# Patient Record
Sex: Male | Born: 1960
Health system: Southern US, Community
[De-identification: ages and names within clinical notes are randomized; demographics above are authoritative.]

## PROBLEM LIST (undated history)

## (undated) ENCOUNTER — Emergency Department (HOSPITAL_COMMUNITY): Payer: Medicaid Other | Source: Home / Self Care

## (undated) DIAGNOSIS — H6091 Unspecified otitis externa, right ear: Secondary | ICD-10-CM

## (undated) DIAGNOSIS — B1920 Unspecified viral hepatitis C without hepatic coma: Secondary | ICD-10-CM

## (undated) DIAGNOSIS — I10 Essential (primary) hypertension: Secondary | ICD-10-CM

## (undated) DIAGNOSIS — D649 Anemia, unspecified: Secondary | ICD-10-CM

## (undated) DIAGNOSIS — G4733 Obstructive sleep apnea (adult) (pediatric): Secondary | ICD-10-CM

## (undated) DIAGNOSIS — J449 Chronic obstructive pulmonary disease, unspecified: Secondary | ICD-10-CM

## (undated) DIAGNOSIS — K5790 Diverticulosis of intestine, part unspecified, without perforation or abscess without bleeding: Secondary | ICD-10-CM

## (undated) DIAGNOSIS — E785 Hyperlipidemia, unspecified: Secondary | ICD-10-CM

## (undated) DIAGNOSIS — D126 Benign neoplasm of colon, unspecified: Secondary | ICD-10-CM

## (undated) DIAGNOSIS — J45909 Unspecified asthma, uncomplicated: Secondary | ICD-10-CM

## (undated) DIAGNOSIS — E079 Disorder of thyroid, unspecified: Secondary | ICD-10-CM

## (undated) DIAGNOSIS — N189 Chronic kidney disease, unspecified: Secondary | ICD-10-CM

## (undated) DIAGNOSIS — G4730 Sleep apnea, unspecified: Secondary | ICD-10-CM

## (undated) DIAGNOSIS — I509 Heart failure, unspecified: Secondary | ICD-10-CM

## (undated) DIAGNOSIS — M109 Gout, unspecified: Secondary | ICD-10-CM

## (undated) HISTORY — DX: Hyperlipidemia, unspecified: E78.5

## (undated) HISTORY — DX: Sleep apnea, unspecified: G47.30

## (undated) HISTORY — DX: Obstructive sleep apnea (adult) (pediatric): G47.33

## (undated) HISTORY — DX: Heart failure, unspecified: I50.9

## (undated) HISTORY — DX: Anemia, unspecified: D64.9

## (undated) HISTORY — DX: Benign neoplasm of colon, unspecified: D12.6

## (undated) HISTORY — DX: Gout, unspecified: M10.9

## (undated) HISTORY — DX: Unspecified asthma, uncomplicated: J45.909

## (undated) HISTORY — DX: Disorder of thyroid, unspecified: E07.9

## (undated) HISTORY — DX: Unspecified viral hepatitis C without hepatic coma: B19.20

## (undated) HISTORY — DX: Chronic kidney disease, unspecified: N18.9

## (undated) HISTORY — DX: Diverticulosis of intestine, part unspecified, without perforation or abscess without bleeding: K57.90

## (undated) HISTORY — DX: Essential (primary) hypertension: I10

---
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
## 2004-12-31 ENCOUNTER — Emergency Department (HOSPITAL_COMMUNITY): Admission: EM | Admit: 2004-12-31 | Discharge: 2004-12-31 | Payer: Self-pay | Admitting: Emergency Medicine

## 2005-01-31 ENCOUNTER — Emergency Department (HOSPITAL_COMMUNITY): Admission: EM | Admit: 2005-01-31 | Discharge: 2005-01-31 | Payer: Self-pay | Admitting: Emergency Medicine

## 2005-05-09 ENCOUNTER — Emergency Department (HOSPITAL_COMMUNITY): Admission: EM | Admit: 2005-05-09 | Discharge: 2005-05-10 | Payer: Self-pay | Admitting: Emergency Medicine

## 2005-07-11 ENCOUNTER — Emergency Department (HOSPITAL_COMMUNITY): Admission: EM | Admit: 2005-07-11 | Discharge: 2005-07-11 | Payer: Self-pay | Admitting: Family Medicine

## 2005-07-14 ENCOUNTER — Emergency Department (HOSPITAL_COMMUNITY): Admission: EM | Admit: 2005-07-14 | Discharge: 2005-07-14 | Payer: Self-pay | Admitting: Emergency Medicine

## 2006-09-13 ENCOUNTER — Emergency Department (HOSPITAL_COMMUNITY): Admission: EM | Admit: 2006-09-13 | Discharge: 2006-09-13 | Payer: Self-pay | Admitting: Family Medicine

## 2007-03-12 ENCOUNTER — Emergency Department (HOSPITAL_COMMUNITY): Admission: EM | Admit: 2007-03-12 | Discharge: 2007-03-12 | Payer: Self-pay | Admitting: Family Medicine

## 2007-04-28 ENCOUNTER — Emergency Department (HOSPITAL_COMMUNITY): Admission: EM | Admit: 2007-04-28 | Discharge: 2007-04-28 | Payer: Self-pay | Admitting: Emergency Medicine

## 2007-11-25 ENCOUNTER — Emergency Department (HOSPITAL_COMMUNITY): Admission: EM | Admit: 2007-11-25 | Discharge: 2007-11-25 | Payer: Self-pay | Admitting: Family Medicine

## 2009-02-21 ENCOUNTER — Inpatient Hospital Stay (HOSPITAL_COMMUNITY): Admission: EM | Admit: 2009-02-21 | Discharge: 2009-02-26 | Payer: Self-pay | Admitting: Family Medicine

## 2009-02-22 ENCOUNTER — Encounter (INDEPENDENT_AMBULATORY_CARE_PROVIDER_SITE_OTHER): Payer: Self-pay | Admitting: Emergency Medicine

## 2009-03-03 ENCOUNTER — Ambulatory Visit: Payer: Self-pay | Admitting: *Deleted

## 2009-04-01 ENCOUNTER — Ambulatory Visit: Payer: Self-pay | Admitting: Family Medicine

## 2009-04-21 ENCOUNTER — Ambulatory Visit: Payer: Self-pay | Admitting: *Deleted

## 2009-06-14 ENCOUNTER — Encounter (INDEPENDENT_AMBULATORY_CARE_PROVIDER_SITE_OTHER): Payer: Self-pay | Admitting: Family Medicine

## 2009-06-14 ENCOUNTER — Other Ambulatory Visit: Admission: RE | Admit: 2009-06-14 | Discharge: 2009-06-14 | Payer: Self-pay | Admitting: Family Medicine

## 2009-06-14 ENCOUNTER — Ambulatory Visit: Payer: Self-pay | Admitting: Family Medicine

## 2009-07-03 ENCOUNTER — Emergency Department (HOSPITAL_COMMUNITY): Admission: EM | Admit: 2009-07-03 | Discharge: 2009-07-03 | Payer: Self-pay | Admitting: Emergency Medicine

## 2009-07-10 ENCOUNTER — Emergency Department (HOSPITAL_COMMUNITY): Admission: EM | Admit: 2009-07-10 | Discharge: 2009-07-10 | Payer: Self-pay | Admitting: Emergency Medicine

## 2009-08-23 ENCOUNTER — Ambulatory Visit (HOSPITAL_COMMUNITY): Admission: RE | Admit: 2009-08-23 | Discharge: 2009-08-23 | Payer: Self-pay | Admitting: Vascular Surgery

## 2009-08-23 ENCOUNTER — Ambulatory Visit: Payer: Self-pay | Admitting: Vascular Surgery

## 2009-09-07 ENCOUNTER — Ambulatory Visit: Payer: Self-pay | Admitting: Vascular Surgery

## 2009-09-12 ENCOUNTER — Ambulatory Visit (HOSPITAL_COMMUNITY): Admission: RE | Admit: 2009-09-12 | Discharge: 2009-09-12 | Payer: Self-pay | Admitting: Vascular Surgery

## 2009-09-12 HISTORY — PX: AV FISTULA PLACEMENT: SHX1204

## 2009-09-23 ENCOUNTER — Ambulatory Visit: Payer: Self-pay | Admitting: Family Medicine

## 2009-10-06 ENCOUNTER — Ambulatory Visit: Payer: Self-pay | Admitting: *Deleted

## 2009-10-14 ENCOUNTER — Ambulatory Visit: Payer: Self-pay | Admitting: Vascular Surgery

## 2009-12-02 ENCOUNTER — Ambulatory Visit: Payer: Self-pay | Admitting: Vascular Surgery

## 2010-03-12 ENCOUNTER — Emergency Department (HOSPITAL_COMMUNITY): Admission: EM | Admit: 2010-03-12 | Discharge: 2010-03-12 | Payer: Self-pay | Admitting: Family Medicine

## 2011-01-09 ENCOUNTER — Inpatient Hospital Stay (INDEPENDENT_AMBULATORY_CARE_PROVIDER_SITE_OTHER)
Admission: RE | Admit: 2011-01-09 | Discharge: 2011-01-09 | Disposition: A | Discharge status: Home / Self Care | Payer: Self-pay | Source: Ambulatory Visit | Attending: Emergency Medicine | Admitting: Emergency Medicine

## 2011-01-09 DIAGNOSIS — J039 Acute tonsillitis, unspecified: Secondary | ICD-10-CM

## 2011-01-09 DIAGNOSIS — I889 Nonspecific lymphadenitis, unspecified: Secondary | ICD-10-CM

## 2011-01-31 ENCOUNTER — Emergency Department (HOSPITAL_COMMUNITY)
Admission: EM | Admit: 2011-01-31 | Discharge: 2011-01-31 | Disposition: A | Discharge status: Home / Self Care | Payer: Self-pay | Attending: Emergency Medicine | Admitting: Emergency Medicine

## 2011-01-31 DIAGNOSIS — M79609 Pain in unspecified limb: Secondary | ICD-10-CM | POA: Insufficient documentation

## 2011-01-31 DIAGNOSIS — I10 Essential (primary) hypertension: Secondary | ICD-10-CM | POA: Insufficient documentation

## 2011-02-07 LAB — POCT I-STAT 4, (NA,K, GLUC, HGB,HCT)
Glucose, Bld: 97 mg/dL (ref 70–99)
HCT: 44 % (ref 39.0–52.0)
Hemoglobin: 15 g/dL (ref 13.0–17.0)

## 2011-02-08 LAB — POCT I-STAT 4, (NA,K, GLUC, HGB,HCT)
Glucose, Bld: 99 mg/dL (ref 70–99)
Potassium: 3.6 mEq/L (ref 3.5–5.1)
Sodium: 143 mEq/L (ref 135–145)

## 2011-02-09 LAB — POCT I-STAT, CHEM 8
BUN: 38 mg/dL — ABNORMAL HIGH (ref 6–23)
Calcium, Ion: 1.17 mmol/L (ref 1.12–1.32)
Creatinine, Ser: 3.4 mg/dL — ABNORMAL HIGH (ref 0.4–1.5)
Glucose, Bld: 72 mg/dL (ref 70–99)
Hemoglobin: 12.9 g/dL — ABNORMAL LOW (ref 13.0–17.0)
Sodium: 144 mEq/L (ref 135–145)
TCO2: 30 mmol/L (ref 0–100)

## 2011-02-14 LAB — C3 COMPLEMENT: C3 Complement: 76 mg/dL — ABNORMAL LOW (ref 88–201)

## 2011-02-14 LAB — CBC
HCT: 31.4 % — ABNORMAL LOW (ref 39.0–52.0)
HCT: 32.1 % — ABNORMAL LOW (ref 39.0–52.0)
HCT: 34.5 % — ABNORMAL LOW (ref 39.0–52.0)
Hemoglobin: 11 g/dL — ABNORMAL LOW (ref 13.0–17.0)
Hemoglobin: 12 g/dL — ABNORMAL LOW (ref 13.0–17.0)
Hemoglobin: 12.2 g/dL — ABNORMAL LOW (ref 13.0–17.0)
Hemoglobin: 13 g/dL (ref 13.0–17.0)
MCHC: 33.4 g/dL (ref 30.0–36.0)
MCHC: 34.7 g/dL (ref 30.0–36.0)
MCHC: 34.8 g/dL (ref 30.0–36.0)
MCHC: 35 g/dL (ref 30.0–36.0)
MCV: 83.7 fL (ref 78.0–100.0)
MCV: 83.8 fL (ref 78.0–100.0)
Platelets: 101 10*3/uL — ABNORMAL LOW (ref 150–400)
Platelets: 109 10*3/uL — ABNORMAL LOW (ref 150–400)
Platelets: 117 10*3/uL — ABNORMAL LOW (ref 150–400)
Platelets: 133 10*3/uL — ABNORMAL LOW (ref 150–400)
Platelets: 161 10*3/uL (ref 150–400)
RBC: 4.13 MIL/uL — ABNORMAL LOW (ref 4.22–5.81)
RDW: 13.5 % (ref 11.5–15.5)
RDW: 13.7 % (ref 11.5–15.5)
RDW: 13.8 % (ref 11.5–15.5)
RDW: 13.9 % (ref 11.5–15.5)
RDW: 13.9 % (ref 11.5–15.5)
RDW: 14.3 % (ref 11.5–15.5)

## 2011-02-14 LAB — BASIC METABOLIC PANEL WITH GFR
CO2: 31 meq/L (ref 19–32)
Chloride: 97 meq/L (ref 96–112)
Glucose, Bld: 95 mg/dL (ref 70–99)
Potassium: 3 meq/L — ABNORMAL LOW (ref 3.5–5.1)
Sodium: 138 meq/L (ref 135–145)

## 2011-02-14 LAB — COMPREHENSIVE METABOLIC PANEL
ALT: 46 U/L (ref 0–53)
ALT: 58 U/L — ABNORMAL HIGH (ref 0–53)
AST: 40 U/L — ABNORMAL HIGH (ref 0–37)
AST: 57 U/L — ABNORMAL HIGH (ref 0–37)
Albumin: 2.8 g/dL — ABNORMAL LOW (ref 3.5–5.2)
Alkaline Phosphatase: 52 U/L (ref 39–117)
CO2: 33 mEq/L — ABNORMAL HIGH (ref 19–32)
Calcium: 8.5 mg/dL (ref 8.4–10.5)
Chloride: 98 mEq/L (ref 96–112)
GFR calc Af Amer: 17 mL/min — ABNORMAL LOW (ref >60)
GFR calc non Af Amer: 16 mL/min — ABNORMAL LOW (ref >60)
Potassium: 4.2 mEq/L (ref 3.5–5.1)
Sodium: 135 mEq/L (ref 135–145)
Sodium: 138 mEq/L (ref 135–145)
Total Protein: 6.2 g/dL (ref 6.0–8.3)

## 2011-02-14 LAB — HEMOGLOBIN A1C

## 2011-02-14 LAB — HEPATIC FUNCTION PANEL
Bilirubin, Direct: 0.1 mg/dL (ref 0.0–0.3)
Indirect Bilirubin: 0.8 mg/dL (ref 0.3–0.9)

## 2011-02-14 LAB — PROTEIN, URINE, 24 HOUR
Protein, 24H Urine: 1184 mg/d — ABNORMAL HIGH (ref 50–100)
Urine Total Volume-UPROT: 1850 mL

## 2011-02-14 LAB — CK TOTAL AND CKMB (NOT AT ARMC)
CK, MB: 4.6 ng/mL — ABNORMAL HIGH (ref 0.3–4.0)
CK, MB: 7.2 ng/mL — ABNORMAL HIGH (ref 0.3–4.0)
Relative Index: 0.5 (ref 0.0–2.5)
Relative Index: 0.5 (ref 0.0–2.5)
Relative Index: 0.5 (ref 0.0–2.5)
Relative Index: 0.6 (ref 0.0–2.5)
Total CK: 1194 U/L — ABNORMAL HIGH (ref 7–232)
Total CK: 866 U/L — ABNORMAL HIGH (ref 7–232)

## 2011-02-14 LAB — RETICULOCYTES
RBC.: 4.37 MIL/uL (ref 4.22–5.81)
Retic Count, Absolute: 39.3 10*3/uL (ref 19.0–186.0)

## 2011-02-14 LAB — TSH: TSH: 1.833 u[IU]/mL (ref 0.350–4.500)

## 2011-02-14 LAB — CALCIUM, IONIZED: Calcium, Ion: 1.12 mmol/L (ref 1.12–1.32)

## 2011-02-14 LAB — POCT I-STAT, CHEM 8
Calcium, Ion: 1.09 mmol/L — ABNORMAL LOW (ref 1.12–1.32)
Creatinine, Ser: 4.7 mg/dL — ABNORMAL HIGH (ref 0.4–1.5)
Glucose, Bld: 124 mg/dL — ABNORMAL HIGH (ref 70–99)
Hemoglobin: 15.6 g/dL (ref 13.0–17.0)
Potassium: 3.2 mEq/L — ABNORMAL LOW (ref 3.5–5.1)

## 2011-02-14 LAB — RAPID URINE DRUG SCREEN, HOSP PERFORMED
Amphetamines: NOT DETECTED
Benzodiazepines: NOT DETECTED
Cocaine: NOT DETECTED
Opiates: NOT DETECTED
Tetrahydrocannabinol: POSITIVE — AB

## 2011-02-14 LAB — BASIC METABOLIC PANEL
BUN: 26 mg/dL — ABNORMAL HIGH (ref 6–23)
BUN: 40 mg/dL — ABNORMAL HIGH (ref 6–23)
CO2: 28 mEq/L (ref 19–32)
Calcium: 8.1 mg/dL — ABNORMAL LOW (ref 8.4–10.5)
Calcium: 8.4 mg/dL (ref 8.4–10.5)
Calcium: 8.7 mg/dL (ref 8.4–10.5)
Calcium: 8.7 mg/dL (ref 8.4–10.5)
Chloride: 106 mEq/L (ref 96–112)
Creatinine, Ser: 3.89 mg/dL — ABNORMAL HIGH (ref 0.4–1.5)
Creatinine, Ser: 4.19 mg/dL — ABNORMAL HIGH (ref 0.4–1.5)
Creatinine, Ser: 4.23 mg/dL — ABNORMAL HIGH (ref 0.4–1.5)
GFR calc Af Amer: 19 mL/min — ABNORMAL LOW (ref >60)
GFR calc Af Amer: 20 mL/min — ABNORMAL LOW (ref >60)
GFR calc non Af Amer: 15 mL/min — ABNORMAL LOW (ref >60)
GFR calc non Af Amer: 15 mL/min — ABNORMAL LOW (ref >60)
GFR calc non Af Amer: 16 mL/min — ABNORMAL LOW (ref >60)
Glucose, Bld: 132 mg/dL — ABNORMAL HIGH (ref 70–99)
Potassium: 3.9 mEq/L (ref 3.5–5.1)
Sodium: 132 mEq/L — ABNORMAL LOW (ref 135–145)
Sodium: 140 mEq/L (ref 135–145)

## 2011-02-14 LAB — UIFE/LIGHT CHAINS/TP QN, 24-HR UR
Free Kappa Lt Chains,Ur: 14.5 mg/dL — ABNORMAL HIGH (ref 0.04–1.51)
Free Kappa/Lambda Ratio: 3.82 ratio (ref 0.46–4.00)
Free Lambda Excretion/Day: 95 mg/d
Free Lambda Lt Chains,Ur: 3.8 mg/dL — ABNORMAL HIGH (ref 0.08–1.01)
Free Lt Chn Excr Rate: 362.5 mg/d
Time: 24 hours
Total Protein, Urine-Ur/day: 1030 mg/d — ABNORMAL HIGH (ref 10–140)
Total Protein, Urine: 41.2 mg/dL
Volume, Urine: 2500 mL

## 2011-02-14 LAB — URINALYSIS, ROUTINE W REFLEX MICROSCOPIC
Bilirubin Urine: NEGATIVE
Ketones, ur: NEGATIVE mg/dL
Nitrite: NEGATIVE
Urobilinogen, UA: 1 mg/dL (ref 0.0–1.0)

## 2011-02-14 LAB — HEPATITIS PANEL, ACUTE
Hep B C IgM: NEGATIVE
Hepatitis B Surface Ag: NEGATIVE

## 2011-02-14 LAB — LIPID PANEL
Total CHOL/HDL Ratio: 3.7 RATIO
VLDL: 16 mg/dL (ref 0–40)

## 2011-02-14 LAB — DIFFERENTIAL
Basophils Absolute: 0.1 10*3/uL (ref 0.0–0.1)
Basophils Relative: 1 % (ref 0–1)
Eosinophils Relative: 2 % (ref 0–5)
Monocytes Absolute: 0.9 10*3/uL (ref 0.1–1.0)
Neutro Abs: 4.9 10*3/uL (ref 1.7–7.7)

## 2011-02-14 LAB — PROTIME-INR: INR: 1 (ref 0.00–1.49)

## 2011-02-14 LAB — HEPATITIS C ANTIBODY: HCV Ab: REACTIVE — AB

## 2011-02-14 LAB — HEPARIN INDUCED THROMBOCYTOPENIA PNL: Patient O.D.: 0.404

## 2011-02-14 LAB — METANEPHRINES, URINE, 24 HOUR: Normetanephrine, 24H Ur: 435 mcg/24 h (ref 88–649)

## 2011-02-14 LAB — IRON AND TIBC
Iron: 63 ug/dL (ref 42–135)
TIBC: 290 ug/dL (ref 215–435)

## 2011-02-14 LAB — CARDIAC PANEL(CRET KIN+CKTOT+MB+TROPI): Troponin I: 0.14 ng/mL — ABNORMAL HIGH (ref 0.00–0.06)

## 2011-02-14 LAB — C4 COMPLEMENT: Complement C4, Body Fluid: 25 mg/dL (ref 16–47)

## 2011-02-14 LAB — TROPONIN I

## 2011-02-14 LAB — CREATININE, URINE, RANDOM: Creatinine, Urine: 225.5 mg/dL

## 2011-02-14 LAB — APTT: aPTT: 31 seconds (ref 24–37)

## 2011-02-14 LAB — HEMOCCULT GUIAC POC 1CARD (OFFICE): Fecal Occult Bld: NEGATIVE

## 2011-02-14 LAB — ANTI-NEUTROPHIL ANTIBODY

## 2011-02-14 LAB — FOLATE: Folate: 16 ng/mL

## 2011-02-14 LAB — HEPARIN ANTIBODY SCREEN

## 2011-02-19 ENCOUNTER — Inpatient Hospital Stay (INDEPENDENT_AMBULATORY_CARE_PROVIDER_SITE_OTHER)
Admission: RE | Admit: 2011-02-19 | Discharge: 2011-02-19 | Disposition: A | Discharge status: Home / Self Care | Payer: Self-pay | Source: Ambulatory Visit | Attending: Emergency Medicine | Admitting: Emergency Medicine

## 2011-02-19 DIAGNOSIS — R6889 Other general symptoms and signs: Secondary | ICD-10-CM

## 2011-03-20 NOTE — Assessment & Plan Note (Signed)
OFFICE VISIT   [name], [name]  DOB:  08-Oct-1961                                       09/07/2009  [id number]   The patient returns for followup after placement of a left radiocephalic  AV fistula on October 19.  He reports no complaints of numbness or  tingling in his hand.  Left wrist incision is well-healed.  Unfortunately there is no flow in his left radiocephalic AV fistula at  this point.  Blood pressure today was 129/85, heart rate 56 and regular.  Temperature 98.6.   His vein was fairly small at the time of the initial fistula placement.  This has now failed.  I believe the best option for him at this point  would be placement of a left brachiocephalic AV fistula.  The vein in  the upper arm was between 34 and 36 mm in diameter.  We will schedule  placement of his left brachiocephalic AV fistula on [id number].  Risks,  benefits, possible complications and procedure details including but not  limited to bleeding, infection, fistula nonmaturation were explained to  the patient and he understands and agrees to proceed.   Jessy Oto. Fields, MD  Electronically Signed   CEF/MEDQ  D:  09/08/2009  T:  09/08/2009  Job:  [id number]

## 2011-03-20 NOTE — Procedures (Signed)
CEPHALIC VEIN MAPPING   INDICATION:  Preop evaluation for AV fistula placement.   HISTORY:  Stage 4 chronic kidney disease.   EXAM:   The right cephalic vein is compressible with diameter measurements  ranging from 0.25 to 0.4 cm.   The left cephalic vein is compressible with diameter measurements  ranging from 0.25 to 0.36 cm.   See attached worksheet for all measurements.   IMPRESSION:  Patent bilateral cephalic veins with diameters as described  above and on the attached worksheet.   ___________________________________________  P. Drucie Opitz, M.D.   CH/MEDQ  D:  04/22/2009  T:  04/22/2009  Job:  [id number]

## 2011-03-20 NOTE — Assessment & Plan Note (Signed)
OFFICE VISIT   [name], [name]  DOB:  1961-01-02                                       10/14/2009  [id number]   The patient returns to clinic following creation of a left upper arm  arteriovenous fistula.  At this time, he is not complaining of pain in  the arm.  He is demonstrating no signs of a steal or claudication.   PHYSICAL EXAM:  Revealed a well-nourished man in no apparent distress.  Heart rate was 69, blood pressure 143/86, O2 sat was 100% and  temperature was 97.  The rest of the exam was limited to his left arm.  He had good palpable radial pulses.  A well healing incision was  appreciated in his antecubital fossa.  A good thrill was appreciated in  the cephalic vein.   He is not currently on dialysis.  We will have him to return for a  followup visit with Dr. Donnetta Hutching in 6 weeks to assess maturation of the  fistula.   Chad Cordial, PA   Rosetta Posner, M.D.  Electronically Signed   KEL/MEDQ  D:  10/14/2009  T:  10/15/2009  Job:  [id number]

## 2011-03-20 NOTE — Assessment & Plan Note (Signed)
OFFICE VISIT   [name], [name]  DOB:  06/15/1961                                       12/02/2009  [id number]   The patient presents today for follow-up of his left upper arm AV  fistula creation by myself on 09/12/2009  He continues to do well, he  has no discomfort.  He has continued to have excellent maturation of his  left arm AV fistula.  He has a very large cephalic vein from the  antecubital space up into his upper arm.  This does run slightly deep in  the upper arm but it is of good caliber and has excellent flow.  I feel  that he has a very good likelihood of being able to use this if and when  he needs initiation of hemodialysis access.  He will see [place] again on an  as-needed basis.     Rosetta Posner, M.D.  Electronically Signed   TFE/MEDQ  D:  12/02/2009  T:  12/05/2009  Job:  [id number]   cc:   Sherril Croon, M.D.

## 2011-03-20 NOTE — Discharge Summary (Signed)
[name], Johnny [name]                 ACCOUNT NO.:  [id number]   MEDICAL RECORD NO.:  [id number]          PATIENT TYPE:  INP   LOCATION:  [id number]                         FACILITY:  [place]   PHYSICIAN:  Cherene Altes, M.D.DATE OF BIRTH:  1961-06-06   DATE OF ADMISSION:  02/21/2009  DATE OF DISCHARGE:  02/26/2009                               DISCHARGE SUMMARY   PRIMARY CARE PHYSICIAN:  [facility] - Dr. Leward Quan.   NEPHROLOGIST:  Sherril Croon, M.D.   DISCHARGE DIAGNOSES:  1. Malignant hypertension/hypertensive urgency -      a.     A 24-hour urine for metanephrines currently pending - to be       followed up in outpatient setting.      b.     Difficult to control but managed on above-listed medication       regimen.      c.     TSH and serum cortisol confirmed to be within normal range.  2. Newly diagnosed severe renal insufficiency/failure -      a.     Creatinine nadir of 3.8 and peak of 4.46 during hospital       stay.      b.     Initial serum workup accomplished by [facility] during hospital stay.      c.     The patient is to contact Dr. Edrick Oh for followup as       detailed below.  3. Newly diagnosed hepatitis C positive -      a.     Human immunodeficiency virus negative.      b.     Hepatitis C viral load pending.      c.     Liver function tests normalized during hospital stay.      d.     Patient information sheets provided to patient printed from       Up To Date for education.      e.     Consider outpatient hepatology consultation once viral load       returns.  4. Heparin-induced thrombocytopenia - resolved with discontinuation of      subcutaneous heparin for deep venous thrombosis prophylaxis.  5. Tobacco abuse - counseled extensively on the absolute need for      discontinuation of tobacco.  6. Alcohol abuse - counseled extensively as the need for absolute      abstention from alcohol.  7. Substance abuse.   DISCHARGE  MEDICATIONS:  1. Norvasc 10 mg p.o. daily.  2. Hydralazine 25 mg p.o. t.i.d.  3. Labetalol 200 mg - 3 tablets b.i.d.  4. Clonidine 0.1 mg b.i.d.  5. Albuterol inhaler p.r.n.   FOLLOW UP:  1. The patient will follow Dr. Leward Quan at [facility] on Apr 01, 2009 at 9:10 a.m.  This was the first available appointment for      a new patient.  At that time, a 24-hour urine for metanephrine      results should be available  and should be followed up.      Additionally, results of hepatitis C viral load should also be      available.  It should be assured that the patient has made contact      with Dr. Justin Mend to establish ongoing outpatient renal followup.      Consultation to hepatologist should also be considered at time.  It      is recommended that a CMET be obtained at that time to reassess      renal function as well as LFTs.  2. The patient is to call Dr. Justin Mend at [facility] at      [phone] to establish outpatient follow-up.   CONSULTATION:  [employer].   PROCEDURES:  1. Status CT scan of the head February 21, 2009 - no acute intracranial      abnormalities.  2. Renal ultrasound February 22, 2009 - normal renal size without      obstruction or mass.  Increased cortical echogenicity bilaterally.   HOSPITAL COURSE:  Mr. Johnny [name] is a pleasant 50 year old gentleman  with a prior history of hypertension who presented to the hospital February 21, 2009 with complaints of severe headache.  In the emergency room, he  was found have a blood pressure of 182/124.  He was also found to be  suffering with renal failure with a creatinine of 4.23.  The exact  chronicity of the renal failure was not clear as the patient has not  received regular outpatient follow-up.  The patient was admitted to the  acute unit for hypertensive emergency.  Renal consultation was carried  out.  Renal ultrasound was accomplished and was unrevealing.  HIV  testing was  accomplished and was negative.  LFTs were noted to be mildly  elevated at time of admission and, given the patient's history of  previously undiagnosed renal failure, hepatitis panel was obtained.  This, in fact, proved to be positive for hepatitis C but negative for  hepatitis A and B.  Hepatitis C viral load has been sent but is not yet  available.  LFTs have normalized during the patient's hospital stay.  Aggressive titration of medications was carried out to treat the  patient's hypertension.  Etiologies for potential secondary hypertension  were followed up.  A 24-hour urine protein was accomplished and was  found to be just below 2 grams.  A 24-hour urine for metanephrines was  ordered and carried out but the results are not yet available.  Random  serum cortisol and TSH were accomplished and were found to be within the  normal expected range.  With careful titration of the above-listed  medications, we were able to control the patient's blood pressure.  The  patient was counseled extensively as to the absolute need to discontinue  tobacco abuse.  He was counseled extensively as to the need to  discontinue alcohol abuse, especially in the setting of a chronic  hepatitis C.  He was advised of the possibility of rapidly progressive  renal failure and ultimately death should he continue to drink and his  hepatitis C become progressive.   With medication titration, the patient's blood pressure was able to be  controlled as noted above.  His LFTs normalized.  The patient had  therefore become medically stable and was ready for discharge home.  The  patient's creatinine remained stable at approximately 4.5.  Electrolytes, however, remained balanced and there was no indication for  acute hemodialysis.  The patient was educated again on the dire need for  close outpatient followup.  Clinical social work consultation was  carried out and the patient was established with outpatient followup  to  assure that he had access to appropriate medical care.  The other follow-  up issues are as discussed above.  The patient is now cleared for  discharge.      Cherene Altes, M.D.  Electronically Signed     JTM/MEDQ  D:  02/26/2009  T:  02/26/2009  Job:  [id number]   cc:   Kara Dies, M.D.  Barton Fanny, M.D.

## 2011-03-20 NOTE — Consult Note (Signed)
NAMECharleson, Mikulich Jushua                 ACCOUNT NO.:  [id number]   MEDICAL RECORD NO.:  [id number]          PATIENT TYPE:  INP   LOCATION:  [id number]                         FACILITY:  [place]   PHYSICIAN:  Sherril Croon, M.D.   DATE OF BIRTH:  18-Feb-1961   DATE OF CONSULTATION:  02/22/2009  DATE OF DISCHARGE:                                 CONSULTATION   REASON FOR CONSULTATION:  Elevated serum creatinine and uncontrolled  hypertension.   HISTORY OF PRESENTING ILLNESS:  This is a 50 year old African American  male with a 6-year history of hypertension uncontrolled, chronic neck  pain for approximately 4 years with chronic BC Powder use, presented to  the emergency room with headache and uncontrolled blood pressure.   PAST MEDICAL HISTORY:  Hypertension.   MEDICATIONS:  IV labetalol.   FAMILY HISTORY:  Positive for end-stage renal disease, diabetes,  hypertension in mother.   SOCIAL HISTORY:  Alcohol 6 packs twice weekly, smokes half a pack a day,  only child.   REVIEW OF SYSTEMS:  Positive for weight change, says he did used to  weigh 250 pounds.  No fatigue, no weakness.  EYES:  No visual complaints  or diplopia, eye pain, eye redness, loss of vision.  EARS, NOSE, MOUTH,  and THROAT:  No sinusitis, epistaxis, no mouth lesions, no sore throat.  CARDIOVASCULAR:  No anginal chest pain, orthopnea, PND, palpitations,  __________.  RESPIRATORY:  No cough, wheeze, or hemoptysis.  ABDOMEN:  No abdominal pain, nausea, vomiting, or change in bowel habits.  UROGENITAL:  No urgency, frequency, or dysuria.  NEUROLOGIC:  No  strokes, seizures, numbness, tingling, pins and needles, weakness.  History of headaches daily, uses BC Powders.  MUSCULOSKELETAL:  No joint  pain, joint swelling.  DERMATOLOGIC:  No skin rash or itching.   PHYSICAL EXAMINATION:  GENERAL:  Alert and awake pleasant gentleman in  no obvious distress.  VITAL SIGNS:  Blood pressure 130/80, pulse 80, temperature afebrile.  CARDIOVASCULAR:  Regular rate and rhythm.  No rubs or gallops.  Systolic  murmur 2/6 left sternal edge.  HEAD AND EYES:  Normocephalic and atraumatic.  Pupils are round, equal,  and reactive to light.  Extraocular movements are intact.  EARS, NOSE, MOUTH, AND THROAT:  __________ oral and nasal mucosa clear.  Oropharynx is clear.  NECK:  Supple.  No thyromegaly.  No adenopathy.  No JVP.  No carotid  bruits.  CARDIOVASCULAR:  No heaves or thrills.  Regular rate and rhythm.  No  rubs or gallops.  RESPIRATORY:  Lung fields clear to auscultation.  No wheezing or rales.  ABDOMEN:  Soft and nontender.  Bowel sounds present.  EXTREMITIES:  No cyanosis, clubbing, or edema.   MOST RECENT LABORATORY DATA:  WBC 9.7, hemoglobin 11.8, platelets 165,  sodium 152, potassium 3, chloride 196, CO2 28, BUN 36, creatinine 4.2,  glucose 132, calcium 8.1.  Urine sodium 31. Chest x-ray negative.  CT  scan negative.  Urine drug screen positive for cannabis.  Urinalysis:  11-20 rbc's, protein greater than 300.   ASSESSMENT AND PLAN:  Severe hypertension  with active an urine sediment,  proteinuria, red blood cells.  May have underlying glomerulonephritis.  Wish to to proceed with serological workup including an HIV, hepatitis,  ANA and ANCA, __________ state, renovascular disease of  pheochromocytoma.  However, his serum potassium needs to be repleted  before these assays can be sent.      Sherril Croon, M.D.  Electronically Signed     MWW/MEDQ  D:  02/22/2009  T:  02/23/2009  Job:  [id number]

## 2011-03-20 NOTE — H&P (Signed)
NAME:  Johnny [name], Johnny [name]                 ACCOUNT NO.:  [id number]   MEDICAL RECORD NO.:  [id number]          PATIENT TYPE:  EMS   LOCATION:  MAJO                         FACILITY:  [place]   PHYSICIAN:  Orpah Melter, MD       DATE OF BIRTH:  [birth year]   DATE OF ADMISSION:  02/21/2009  DATE OF DISCHARGE:                              HISTORY & PHYSICAL   The patient is going to be followed by [employer] [facility].  No  primary care physician.   CHIEF COMPLAINT/HISTORY OF PRESENT ILLNESS:  Mr. Arndorfer is a 50 year old  male with past medical history of hypertension who had been using herbal  medications and following diet and exercise for blood pressure control.  The patient came to the emergency department from Urgent Care after  experiencing headache for the last 2 to 3 months on and off 6/10 to 8/10  in intensity at its worst without any aggravating factor or associated  symptoms and alleviated B.C. Powder and NSAID.  At Urgent Care, they had  found that the patient's blood pressure was 213/146 and also with  significant changes to his creatinine and also BUN.  Reason why they  sent the patient to emergency department for further evaluation and  treatment.  The patient endorses polyuria, polydipsia, and polyphagia.  Denies any chest pain, shortness of breath, cough, abdominal pain,  hematuria, hematemesis, melena and hematochezia.  The patient does not  have any allergic reactions to medications.   PAST MEDICAL HISTORY:  Just for hypertension.   MEDICATIONS:  None.   HABITS:  Tobacco.  The patient is a current smoker, smokes about 20  cigarettes per week.  Regarding alcohol, he was a heavy drinker.  Right  now he has cut down to just two to three 40 ounce beers three to four  times per week.  History of cocaine 25 years ago and had never used  cocaine any more.  The patient admitted to be using marijuana.   SOCIAL HISTORY:  He is single.  He is, at this point, unemployed but  performs maintenance labor, Biomedical scientist.  No insurance.  Lives in  [place] with his girlfriend. No children.   FAMILY HISTORY:  Important history of diabetes, hypertension, both of  his parents.   REVIEW OF SYSTEM:  Completely negative except described on history of  present illness.   PHYSICAL EXAM:  VITAL SIGNS:  Blood pressure 182/124, heart rate 83,  respiratory rate 20, oxygen saturation 99% on room air.  Temperature was  98.5.  GENERAL:  The patient was laying in bed in no acute distress.  HEENT:  Normocephalic and atraumatic.  PERRLA with muddy sclerae.  Moist  mucous membranes.  No erythema and no plaques.  LUNGS:  Clear to auscultation bilaterally.  HEART:  Regular rate.  No murmur or gallops or rubs.  ABDOMEN:  Nontender, nondistended.  Positive bowel sounds.  Soft to  palpation.  EXTREMITIES:  Good pulses bilaterally.  No edema, cyanosis or clubbing.  NEUROLOGIC:  The patient was alert, awake and oriented x3.  Cranial  nerves II-XII  intact.  Muscle strength 5/5 bilaterally.  Deep tendon  reflexes 2+ bilateral and symmetrically.  Normal finger-to-nose.   LABS AND IMAGING STUDIES:  The patient had white blood cells of 8.5,  hemoglobin 13.0, platelets 161,000.  The patient had cardiac markers in  the emergency department with a troponin of 0.17, CK total 1194, CK-MB  7.2, sodium 138, potassium 3.0, chloride 97, bicarb 31, BUN 40,  creatinine 4.23, blood sugar 95, calcium 8.7.  The patient had a urine  drug screen was just positive for marijuana.  CT of the head  demonstrated no acute intracranial abnormalities with right maxillary  inflammation.  Urinalysis few bacteria, 11 to 20 white blood cells and  more than 300 mg/dL of protein.  Chest x-ray demonstrated mild  cardiomegaly and no acute findings.   IMPRESSION AND PLAN:  The patient is going to be admitted secondary to  hypertensive emergency and also renal failure.   PROBLEM LIST:  1. Hypertensive emergency.  At  this point we are going to rule out      other causes than just essential hypertension, poor control.  We      are going to check cortisol level, TSH and also a free T4.  We are      going to check a 2-D echo.  Will admit to telemetry.  The patient      will be started on labetalol drip.  We are going to follow with a      low sodium diet.  We are going to perform stratification checking a      fasting lipid profile and also A1c.  We are going to follow his      electrolytes and we are going to make sure that the patient's      electrolytes continue to be the normal range.  2. Renal failure at this point, most likely secondary to hypertension      as well as the increased use of NSAIDS.  We are going to rule out      is not related to prerenal causes since the patient had been with      increase diuresis and we are going to make sure that this is not      dehydration, checking a CMET and also rule out that is not acute      tubular necrosis.  The patient will receive fluid hydration      overnight.  We are going to follow his creatinine and we are going      to control his blood pressure and we are going to check a renal      ultrasound.  3. Polysubstance abuse.  Tobacco, alcohol, marijuana.  We are going to      offer him a nicotine patch.  We are going to ask Education officer, museum for      cessation counseling.  We are going to give thiamine, folic acid,      and we are going to monitor and do protocol for delirium tremens.  4. Hypokalemia:  Replete potassium.  We are going to check magnesium.      The patient with increased diuresis for the last 4-5 days.  We are      ruling out diabetes.  We are going to check a cortisol level for      further causes of hyperkalemia.  5. Elevated troponin: Most likely secondary to renal inssuficiency and      sustained elevated BP to the point that  his left ventricle and left      atrium had developed hyperthrophy. Will check serial EKG, will      admit to  tele bed and will check serial cardiac enxymes; pt will      also received a 2-D Echo.  6. For prophylaxis, the patient will have Protonix and also heparin.      Julieta Bellini, MD  Electronically Signed      Orpah Melter, MD  Electronically Signed    CEM/MEDQ  D:  02/21/2009  T:  02/21/2009  Job:  [id number]

## 2011-03-20 NOTE — Consult Note (Signed)
VASCULAR SURGERY CONSULTATION   Johnny [name]  DOB:  02/13/1961                                       04/21/2009  [id number]   REFERRING PHYSICIAN:  Sherril Croon, M.D.   REFERRAL DIAGNOSES:  Chronic renal insufficiency.   HISTORY:  The patient is a 50 year old African American male with a  history of stage IV to V chronic kidney disease referred for placement  of hemodialysis access.  Has poorly controlled hypertension leading to  his end-stage renal failure.   Vein mapping reveals patent cephalic vein bilaterally.  He is right  handed.  His left cephalic vein measures from 0.25 to 0.34 cm without  dilatation.   PAST MEDICAL HISTORY:  1. Chronic kidney disease stage IV to V.  2. Hypertension.  3. Anemia of chronic disease.  4. Substance abuse.  5. Hepatitis C positive.   MEDICATIONS:  1. Lasix 1 tablet b.i.d.  2. Clonidine 1 tablet b.i.d.  3. Hydralazine 1 tablet b.i.d.  4. Labetalol t.i.d.   ALLERGIES:  None known.   SOCIAL HISTORY:  The patient is married and unemployed.  He does not  smoke or use alcohol.   PHYSICAL EXAM:  A 50 year old [place] American male appears his stated  age, alert and oriented.  Vital signs:  BP 141/93, pulse 56 per minute.  Upper extremities:  He has 2+ brachial and radial pulses bilaterally.  No edema.   IMPRESSION:  1. Chronic kidney disease stage IV to V approaching hemodialysis.  2. Hypertension.  3. Chronic anemia.  4. Substance abuse.  5. Hepatitis C positive.   PLAN:  The patient will be electively scheduled for placement of a left  arm arteriovenous fistula at [facility].   Dorothea Glassman, M.D.  Electronically Signed  PGH/MEDQ  D:  04/21/2009  T:  04/21/2009  Job:  [id number]   cc:   Sherril Croon, M.D.

## 2011-04-26 ENCOUNTER — Ambulatory Visit: Payer: Self-pay | Admitting: Vascular Surgery

## 2011-05-02 ENCOUNTER — Encounter: Payer: Self-pay | Admitting: Vascular Surgery

## 2011-05-10 ENCOUNTER — Encounter: Payer: Self-pay | Admitting: Vascular Surgery

## 2011-05-10 ENCOUNTER — Ambulatory Visit (INDEPENDENT_AMBULATORY_CARE_PROVIDER_SITE_OTHER): Payer: Self-pay | Admitting: Vascular Surgery

## 2011-05-10 VITALS — BP 135/87 | HR 62 | Temp 97.4°F

## 2011-05-10 DIAGNOSIS — N186 End stage renal disease: Secondary | ICD-10-CM

## 2011-05-10 NOTE — Progress Notes (Signed)
F/U on AVF. Pt c/o fistula " moving around".    Seen in ED on 02-19-11.

## 2011-05-10 NOTE — Progress Notes (Signed)
Patient is a 50 year old male that previously had a left brachiocephalic AV fistula placed by Dr. early in November of 2010. He is currently not on hemodialysis. He is followed by Dr. Justin Mend. He was concerned that the fistula has been expanding in size and "moving".  Physical exam:  Left upper extremity: Hand is warm and well-perfused. Motor function is intact. Fistula is easily palpable from the antecubital area all the way to the shoulder. There is a thrill throughout the fistula.  Assessment mature left brachiocephalic AV fistula, no complicating features.  Plan: Followup on as-needed basis the fistula is ready for use if necessary

## 2011-07-19 ENCOUNTER — Inpatient Hospital Stay (INDEPENDENT_AMBULATORY_CARE_PROVIDER_SITE_OTHER)
Admission: RE | Admit: 2011-07-19 | Discharge: 2011-07-19 | Disposition: A | Discharge status: Home / Self Care | Payer: Self-pay | Source: Ambulatory Visit | Attending: Family Medicine | Admitting: Family Medicine

## 2011-07-19 DIAGNOSIS — I10 Essential (primary) hypertension: Secondary | ICD-10-CM

## 2011-07-19 DIAGNOSIS — N189 Chronic kidney disease, unspecified: Secondary | ICD-10-CM

## 2011-07-19 DIAGNOSIS — J40 Bronchitis, not specified as acute or chronic: Secondary | ICD-10-CM

## 2011-07-22 ENCOUNTER — Emergency Department (HOSPITAL_COMMUNITY)
Admission: EM | Admit: 2011-07-22 | Discharge: 2011-07-23 | Disposition: A | Discharge status: Home / Self Care | Payer: Medicaid Other | Attending: Emergency Medicine | Admitting: Emergency Medicine

## 2011-07-22 ENCOUNTER — Emergency Department (HOSPITAL_COMMUNITY): Payer: Medicaid Other

## 2011-07-22 DIAGNOSIS — R062 Wheezing: Secondary | ICD-10-CM | POA: Insufficient documentation

## 2011-07-22 DIAGNOSIS — J189 Pneumonia, unspecified organism: Secondary | ICD-10-CM | POA: Insufficient documentation

## 2011-07-22 DIAGNOSIS — R0602 Shortness of breath: Secondary | ICD-10-CM | POA: Insufficient documentation

## 2011-07-22 DIAGNOSIS — R05 Cough: Secondary | ICD-10-CM | POA: Insufficient documentation

## 2011-07-22 DIAGNOSIS — I10 Essential (primary) hypertension: Secondary | ICD-10-CM | POA: Insufficient documentation

## 2011-07-22 DIAGNOSIS — R059 Cough, unspecified: Secondary | ICD-10-CM | POA: Insufficient documentation

## 2011-07-23 LAB — COMPREHENSIVE METABOLIC PANEL
ALT: 28 U/L (ref 0–53)
AST: 28 U/L (ref 0–37)
Calcium: 9.5 mg/dL (ref 8.4–10.5)
Creatinine, Ser: 2.62 mg/dL — ABNORMAL HIGH (ref 0.50–1.35)
GFR calc Af Amer: 32 mL/min — ABNORMAL LOW (ref >60)
GFR calc non Af Amer: 26 mL/min — ABNORMAL LOW (ref >60)
Glucose, Bld: 105 mg/dL — ABNORMAL HIGH (ref 70–99)
Sodium: 138 mEq/L (ref 135–145)
Total Protein: 7.6 g/dL (ref 6.0–8.3)

## 2011-07-23 LAB — DIFFERENTIAL
Basophils Relative: 1 % (ref 0–1)
Eosinophils Absolute: 0.3 10*3/uL (ref 0.0–0.7)
Lymphs Abs: 2.3 10*3/uL (ref 0.7–4.0)
Monocytes Absolute: 0.9 10*3/uL (ref 0.1–1.0)
Monocytes Relative: 8 % (ref 3–12)

## 2011-07-23 LAB — CBC
MCH: 26.8 pg (ref 26.0–34.0)
MCHC: 34.8 g/dL (ref 30.0–36.0)
MCV: 77 fL — ABNORMAL LOW (ref 78.0–100.0)
Platelets: 188 10*3/uL (ref 150–400)

## 2011-07-26 LAB — INFLUENZA A AND B ANTIGEN (CONVERTED LAB)
Inflenza A Ag: NEGATIVE
Influenza B Ag: NEGATIVE

## 2011-11-05 ENCOUNTER — Encounter (HOSPITAL_COMMUNITY): Payer: Self-pay | Admitting: *Deleted

## 2011-11-05 ENCOUNTER — Emergency Department (INDEPENDENT_AMBULATORY_CARE_PROVIDER_SITE_OTHER)
Admission: EM | Admit: 2011-11-05 | Discharge: 2011-11-05 | Disposition: A | Discharge status: Home / Self Care | Payer: Medicaid Other | Source: Home / Self Care | Attending: Family Medicine | Admitting: Family Medicine

## 2011-11-05 ENCOUNTER — Telehealth (HOSPITAL_COMMUNITY): Payer: Self-pay | Admitting: *Deleted

## 2011-11-05 DIAGNOSIS — H6691 Otitis media, unspecified, right ear: Secondary | ICD-10-CM

## 2011-11-05 DIAGNOSIS — H6690 Otitis media, unspecified, unspecified ear: Secondary | ICD-10-CM

## 2011-11-05 HISTORY — DX: Chronic obstructive pulmonary disease, unspecified: J44.9

## 2011-11-05 MED ORDER — PSEUDOEPHEDRINE-GUAIFENESIN ER 120-1200 MG PO TB12: 120.0000 mg | ORAL_TABLET | Freq: Two times a day (BID) | ORAL | Status: DC

## 2011-11-05 MED ORDER — AMOXICILLIN 500 MG PO CAPS: 500.0000 mg | ORAL_CAPSULE | Freq: Three times a day (TID) | ORAL | Status: AC

## 2011-11-05 NOTE — ED Provider Notes (Signed)
History     CSN: [id number]  Arrival date & time 11/05/11  1145   First MD Initiated Contact with Patient 11/05/11 1428      Chief Complaint  Patient presents with  . Otalgia    (Consider location/radiation/quality/duration/timing/severity/associated sxs/prior treatment) Patient is a 50 y.o. male presenting with ear pain. The history is provided by the patient.  Otalgia This is a new problem. The current episode started yesterday. There is pain in the right ear. The problem occurs constantly. The problem has not changed since onset.There has been no fever. The pain is mild. Associated symptoms include rhinorrhea and cough. Pertinent negatives include no ear discharge, no diarrhea and no vomiting. His past medical history does not include chronic ear infection or hearing loss. Past medical history comments: no ear problem since a child..    Past Medical History  Diagnosis Date  . Hypertension   . Chronic kidney disease   . COPD (chronic obstructive pulmonary disease)   . Liver disease     Past Surgical History  Procedure Date  . Av fistula placement 09/12/2009    Left arm AVF    Family History  Problem Relation Age of Onset  . Hypertension Mother   . Diabetes Mother     History  Substance Use Topics  . Smoking status: Current Everyday Smoker -- 0.1 packs/day    Types: Cigarettes  . Smokeless tobacco: Not on file   Comment: trying to quit  . Alcohol Use: 0.6 oz/week    1 Cans of beer per week      Review of Systems  Constitutional: Negative.   HENT: Positive for ear pain, congestion, rhinorrhea and postnasal drip. Negative for ear discharge.   Respiratory: Positive for cough.   Gastrointestinal: Negative for vomiting and diarrhea.  Skin: Negative.     Allergies  Heparin and Naproxen  Home Medications   Current Outpatient Rx  Name Route Sig Dispense Refill  . FUROSEMIDE 40 MG PO TABS Oral Take 40 mg by mouth daily.      [name] Kitchen AMLODIPINE BESYLATE 10 MG PO  TABS Oral Take 10 mg by mouth daily.      . AMOXICILLIN 500 MG PO CAPS Oral Take 1 capsule (500 mg total) by mouth 3 (three) times daily. 30 capsule 0  . LABETALOL HCL PO Oral Take 200 mg by mouth. 3 tablets in the morning, 3 in the evening    . PSEUDOEPHEDRINE-GUAIFENESIN ER [phone] MG PO TB12 Oral Take 120-1,200 mg by mouth 2 (two) times daily. 20 each 0    BP 154/89  Pulse 70  Temp(Src) 98.3 F (36.8 C) (Oral)  Resp 18  SpO2 100%  Physical Exam  Vitals reviewed. Constitutional: He appears well-developed.  HENT:  Head: Normocephalic.  Right Ear: External ear and ear canal normal. Tympanic membrane is injected, erythematous and retracted. Tympanic membrane mobility is abnormal.  Left Ear: Hearing, tympanic membrane, external ear and ear canal normal.  Nose: Nose normal.  Mouth/Throat: Oropharynx is clear and moist.  Eyes: Conjunctivae are normal. Pupils are equal, round, and reactive to light.  Neck: Normal range of motion. Neck supple.  Lymphadenopathy:    He has no cervical adenopathy.    ED Course  Procedures (including critical care time)  Labs Reviewed - No data to display No results found.   1. Otitis media of right ear       MDM          Pauline Good, MD 11/05/11 [phone]

## 2011-11-05 NOTE — ED Notes (Signed)
Pt  Reports  Symptoms  Of    Earache  r  Side descibed  As   A  Pressure  As  Well  As  Symptoms  Of   Cough/  Congesting  Body  Aches      And  Chills    X  2  Days    Pt is  Awake  As  Well as  Alert  And  Is  In no  Acute  Distress

## 2012-03-27 ENCOUNTER — Telehealth: Payer: Self-pay

## 2012-03-27 NOTE — Telephone Encounter (Signed)
Phone call from pt. 03/26/12.  C/o numbness left hand x 1 mo. States he thought this was r/t positioning, so hasn't called about it sooner.  Relates hx of past episodes of numbness in right arm and leg, but states it was a long time ago.  Voiced concern about left arm AVF.  States has not ever needed to start dialysis, so access never used.  Denies change in temperature or color of left hand/fingers.  Discussed w/ Dr. Scot Dock at 5:00pm 5/22.  Advised to schedule appt for evaluation, and that no vascular study is needed, initially.

## 2012-03-27 NOTE — Telephone Encounter (Signed)
Spoke with patient to schedule for next available surgeon as per Michaele Offer RN instructions. Appointment scheduled for 04/01/12 @ 845am with Dr Donnetta Hutching. Patient is aware of appt time and is familiar with our office, dpm

## 2012-03-28 ENCOUNTER — Encounter: Payer: Self-pay | Admitting: Vascular Surgery

## 2012-04-01 ENCOUNTER — Encounter: Payer: Self-pay | Admitting: Vascular Surgery

## 2012-04-01 ENCOUNTER — Ambulatory Visit (INDEPENDENT_AMBULATORY_CARE_PROVIDER_SITE_OTHER): Payer: Medicaid Other | Admitting: Vascular Surgery

## 2012-04-01 VITALS — BP 111/75 | HR 75 | Resp 20 | Ht 73.5 in | Wt 200.0 lb

## 2012-04-01 DIAGNOSIS — N186 End stage renal disease: Secondary | ICD-10-CM | POA: Insufficient documentation

## 2012-04-01 NOTE — Progress Notes (Signed)
The patient presents today for evaluation of tingling and numbness in his left hand and right-handed. The patient reports that occurs mostly if she awakes from sleep and reports that it is worse on the left but does happen with similar symptoms on the right hand as well. He also reports that he is occasionally had some numbness and tingling in his right leg. He is status post placement of a left upper arm AV fistula in November of 2010. He has not had initiation of hemodialysis.  Past Medical History  Diagnosis Date  . Hypertension   . Chronic kidney disease   . COPD (chronic obstructive pulmonary disease)   . Liver disease     History  Substance Use Topics  . Smoking status: Former Smoker -- 0.1 packs/day    Types: Cigarettes    Quit date: 04/02/2011  . Smokeless tobacco: Never Used   Comment: trying to quit  . Alcohol Use: 0.6 oz/week    1 Cans of beer per week    Family History  Problem Relation Age of Onset  . Hypertension Mother   . Diabetes Mother     Allergies  Allergen Reactions  . Heparin   . Naproxen     Current outpatient prescriptions:amLODipine (NORVASC) 10 MG tablet, Take 10 mg by mouth daily.  , Disp: , Rfl: ;  ARIPiprazole (ABILIFY) 10 MG tablet, Take 10 mg by mouth daily., Disp: , Rfl: ;  furosemide (LASIX) 40 MG tablet, Take 40 mg by mouth daily.  , Disp: , Rfl: ;  LABETALOL HCL PO, Take 200 mg by mouth. 3 tablets in the morning, 3 in the evening, Disp: , Rfl:  Pseudoephedrine-Guaifenesin [phone] MG TB12, Take 120-1,200 mg by mouth 2 (two) times daily., Disp: 20 each, Rfl: 0  BP 111/75  Pulse 75  Resp 20  Ht 6' 1.5" (1.867 m)  Wt 200 lb (90.719 kg)  BMI 26.03 kg/m2  Body mass index is 26.03 kg/(m^2).   Physical exam well-developed gentleman in no acute distress. He does have a palpable left radial pulse and this is augmented with occlusion of his left upper arm AV fistula. He does have palpable right radial pulse. He does not have any evidence of  ischemia in either hand specifically no lesions tissue loss of her muscle wasting. He does have an excellent upper arm AV fistula on the left.  Impression and plan: I do not see any evidence of steal syndrome. He has had this fistula without difficulty for 3 years and has not had access 70 at. I feel that this most likely is related to carpal tunnel syndrome. He reports it is worse when he awakens from sleep. I did explain that potential use of cock-up splint 6 for this purpose. I do not see enough evidence for steal to suggest ligation of his fistula. This fistula is matured quite nicely and would be available for access immediately should agree be required. He understands and we'll see [place] on an as-needed basis

## 2012-05-05 ENCOUNTER — Emergency Department (HOSPITAL_COMMUNITY)
Admission: EM | Admit: 2012-05-05 | Discharge: 2012-05-05 | Discharge status: Absent without leave | Payer: Self-pay | Source: Home / Self Care | Attending: Family Medicine | Admitting: Family Medicine

## 2012-05-22 ENCOUNTER — Ambulatory Visit: Payer: Medicaid Other | Admitting: Gastroenterology

## 2012-12-13 ENCOUNTER — Emergency Department (HOSPITAL_COMMUNITY)
Admission: EM | Admit: 2012-12-13 | Discharge: 2012-12-13 | Disposition: A | Discharge status: Home / Self Care | Payer: Medicaid Other | Source: Home / Self Care

## 2012-12-13 ENCOUNTER — Emergency Department (INDEPENDENT_AMBULATORY_CARE_PROVIDER_SITE_OTHER): Payer: Medicaid Other

## 2012-12-13 ENCOUNTER — Encounter (HOSPITAL_COMMUNITY): Payer: Self-pay | Admitting: Emergency Medicine

## 2012-12-13 DIAGNOSIS — M25469 Effusion, unspecified knee: Secondary | ICD-10-CM

## 2012-12-13 DIAGNOSIS — M25462 Effusion, left knee: Secondary | ICD-10-CM

## 2012-12-13 MED ORDER — HYDROCODONE-ACETAMINOPHEN 5-325 MG PO TABS
ORAL_TABLET | ORAL | Status: AC
  Filled 2012-12-13: qty 1

## 2012-12-13 MED ORDER — TRAMADOL HCL 50 MG PO TABS: 50.0000 mg | ORAL_TABLET | Freq: Four times a day (QID) | ORAL | Status: DC | PRN

## 2012-12-13 MED ORDER — HYDROCODONE-ACETAMINOPHEN 5-325 MG PO TABS: 1.0000 | ORAL_TABLET | Freq: Once | ORAL | Status: DC

## 2012-12-13 NOTE — ED Notes (Signed)
Left knee pain, onset 2 days ago when darting for the bus.  Patient felt knee "go out"   .  Denies history of this.

## 2012-12-13 NOTE — ED Provider Notes (Signed)
History     CSN: [id number]  Arrival date & time 12/13/12  1549   None     Chief Complaint  Patient presents with  . Knee Pain    HPI: Patient is a 52 y.o. male presenting with knee pain. The history is provided by the patient.  Knee Pain Location:  Knee Injury: yes   Knee location:  L knee Pt states that 2 days ago he was running to catch the bus when he heard apop in his (L) knee and it seemed to "give way". He denies that he fell but the knee had been slightly swollen and painful since event.   Past Medical History  Diagnosis Date  . Hypertension   . Chronic kidney disease   . COPD (chronic obstructive pulmonary disease)   . Liver disease     Past Surgical History  Procedure Laterality Date  . Av fistula placement  09/12/2009    Left arm AVF    Family History  Problem Relation Age of Onset  . Hypertension Mother   . Diabetes Mother     History  Substance Use Topics  . Smoking status: Former Smoker -- 0.10 packs/day    Types: Cigarettes    Quit date: 04/02/2011  . Smokeless tobacco: Never Used     Comment: trying to quit  . Alcohol Use: 0.6 oz/week    1 Cans of beer per week      Review of Systems  All other systems reviewed and are negative.    Allergies  Heparin and Naproxen  Home Medications   Current Outpatient Rx  Name  Route  Sig  Dispense  Refill  . amLODipine (NORVASC) 10 MG tablet   Oral   Take 10 mg by mouth daily.           . ARIPiprazole (ABILIFY) 10 MG tablet   Oral   Take 10 mg by mouth daily.         . furosemide (LASIX) 40 MG tablet   Oral   Take 40 mg by mouth daily.           [name] Kitchen LABETALOL HCL PO   Oral   Take 200 mg by mouth. 3 tablets in the morning, 3 in the evening         . Pseudoephedrine-Guaifenesin [phone] MG TB12   Oral   Take 120-1,200 mg by mouth 2 (two) times daily.   20 each   0     BP 117/65  Pulse 62  Temp(Src) 97.4 F (36.3 C) (Oral)  Resp 16  SpO2 100%  Physical Exam  Nursing  note and vitals reviewed. Constitutional: He is oriented to person, place, and time. He appears well-developed and well-nourished.  HENT:  Head: Normocephalic and atraumatic.  Eyes: Conjunctivae are normal.  Neck: Neck supple.  Cardiovascular: Normal rate.   Pulmonary/Chest: Effort normal.  Abdominal: Soft.  Musculoskeletal:       Left knee: He exhibits swelling and effusion. He exhibits no erythema. Tenderness found.  Neurological: He is alert and oriented to person, place, and time.  Skin: Skin is warm and dry.  Psychiatric: He has a normal mood and affect.    ED Course  Procedures   Labs Reviewed - No data to display Dg Knee Complete 4 Views Left  12/13/2012  *RADIOLOGY REPORT*  Clinical Data: Left medial knee pain  LEFT KNEE - COMPLETE 4+ VIEW  Comparison: None.  Findings: No fracture or dislocation is seen.  Mild tricompartmental  degenerative changes.  Small suprapatellar knee joint effusion.  IMPRESSION: No fracture or dislocation is seen.  Mild degenerative changes with small suprapatellar knee joint effusion.   Original Report Authenticated By: Julian Hy, M.D.      No diagnosis found.    MDM  Heard pop in (L) knee while running 2 days ago. (L) knee xr c/w small suprapatellar knee joint effusion. Acw wrap applied. Pt declined crutches. Will provide short course of Tramadol as pt is unable to take Tylenol and or Ibuprofen. Orth referral provided for f/u if (L) knee pain persist.         Jeryl Columbia, NP 12/13/12 1919

## 2012-12-13 NOTE — ED Notes (Signed)
Patient refused pain medicine

## 2012-12-15 NOTE — ED Provider Notes (Signed)
Medical screening examination/treatment/procedure(s) were performed by resident physician or non-physician practitioner and as supervising physician I was immediately available for consultation/collaboration.   Pauline Good MD.   Billy Fischer, MD 12/15/12 1440

## 2012-12-27 ENCOUNTER — Telehealth (HOSPITAL_COMMUNITY): Payer: Self-pay | Admitting: Family Medicine

## 2012-12-27 NOTE — ED Notes (Signed)
Patient called stating he is continuing to have pain and was told by Dr Milinda Antis.  He would need follow-up if not better in a week.  Patient made aware he would need to call Dr Apolonio Schneiders at [phone] to arrange follow-up.  Patient expressed understanding.  He did not have any additional questions.

## 2013-01-21 ENCOUNTER — Other Ambulatory Visit: Payer: Self-pay | Admitting: Internal Medicine

## 2013-01-21 DIAGNOSIS — B182 Chronic viral hepatitis C: Secondary | ICD-10-CM

## 2013-01-28 ENCOUNTER — Ambulatory Visit
Admission: RE | Admit: 2013-01-28 | Discharge: 2013-01-28 | Disposition: A | Discharge status: Home / Self Care | Payer: Medicaid Other | Source: Ambulatory Visit | Attending: Internal Medicine | Admitting: Internal Medicine

## 2013-01-28 DIAGNOSIS — B182 Chronic viral hepatitis C: Secondary | ICD-10-CM

## 2013-07-12 ENCOUNTER — Encounter (HOSPITAL_COMMUNITY): Payer: Self-pay | Admitting: *Deleted

## 2013-07-12 ENCOUNTER — Emergency Department (HOSPITAL_COMMUNITY)
Admission: EM | Admit: 2013-07-12 | Discharge: 2013-07-12 | Disposition: A | Discharge status: Home / Self Care | Payer: Medicaid Other | Attending: Emergency Medicine | Admitting: Emergency Medicine

## 2013-07-12 DIAGNOSIS — I129 Hypertensive chronic kidney disease with stage 1 through stage 4 chronic kidney disease, or unspecified chronic kidney disease: Secondary | ICD-10-CM | POA: Insufficient documentation

## 2013-07-12 DIAGNOSIS — J4489 Other specified chronic obstructive pulmonary disease: Secondary | ICD-10-CM | POA: Insufficient documentation

## 2013-07-12 DIAGNOSIS — Z79899 Other long term (current) drug therapy: Secondary | ICD-10-CM | POA: Insufficient documentation

## 2013-07-12 DIAGNOSIS — R6884 Jaw pain: Secondary | ICD-10-CM | POA: Insufficient documentation

## 2013-07-12 DIAGNOSIS — H9209 Otalgia, unspecified ear: Secondary | ICD-10-CM | POA: Insufficient documentation

## 2013-07-12 DIAGNOSIS — Z87891 Personal history of nicotine dependence: Secondary | ICD-10-CM | POA: Insufficient documentation

## 2013-07-12 DIAGNOSIS — J3489 Other specified disorders of nose and nasal sinuses: Secondary | ICD-10-CM | POA: Insufficient documentation

## 2013-07-12 DIAGNOSIS — N189 Chronic kidney disease, unspecified: Secondary | ICD-10-CM | POA: Insufficient documentation

## 2013-07-12 DIAGNOSIS — J449 Chronic obstructive pulmonary disease, unspecified: Secondary | ICD-10-CM | POA: Insufficient documentation

## 2013-07-12 DIAGNOSIS — J36 Peritonsillar abscess: Secondary | ICD-10-CM | POA: Insufficient documentation

## 2013-07-12 DIAGNOSIS — R509 Fever, unspecified: Secondary | ICD-10-CM | POA: Insufficient documentation

## 2013-07-12 DIAGNOSIS — R1310 Dysphagia, unspecified: Secondary | ICD-10-CM | POA: Insufficient documentation

## 2013-07-12 DIAGNOSIS — Z888 Allergy status to other drugs, medicaments and biological substances status: Secondary | ICD-10-CM | POA: Insufficient documentation

## 2013-07-12 LAB — RAPID STREP SCREEN (MED CTR MEBANE ONLY): Streptococcus, Group A Screen (Direct): NEGATIVE

## 2013-07-12 MED ORDER — DEXAMETHASONE SODIUM PHOSPHATE 10 MG/ML IJ SOLN: 20.0000 mg | Freq: Once | INTRAMUSCULAR | Status: DC

## 2013-07-12 MED ORDER — CLINDAMYCIN PALMITATE HCL 75 MG/5ML PO SOLR: 300.0000 mg | Freq: Four times a day (QID) | ORAL | Status: DC

## 2013-07-12 MED ORDER — CLINDAMYCIN PHOSPHATE 900 MG/50ML IV SOLN
900.0000 mg | Freq: Once | INTRAVENOUS | Status: AC
  Administered 2013-07-12: 900 mg via INTRAVENOUS
  Filled 2013-07-12: qty 50

## 2013-07-12 MED ORDER — DEXAMETHASONE SODIUM PHOSPHATE 10 MG/ML IJ SOLN
20.0000 mg | Freq: Once | INTRAMUSCULAR | Status: AC
  Administered 2013-07-12: 20 mg via INTRAVENOUS
  Filled 2013-07-12: qty 2

## 2013-07-12 NOTE — ED Notes (Signed)
Pt is here with fever, sorethroat, left ear pain, and left lower jaw pain.

## 2013-07-12 NOTE — ED Provider Notes (Signed)
CSN: [id number]     Arrival date & time 07/12/13  1604 History  This chart was scribed for Noland Fordyce, [place] ractitioner working with Delora Fuel, MD by Chesley Mires, ED Scribe. This patient was seen in room TR04C/TR04C and the patient's care was started at 5:54 PM.   Chief Complaint  Patient presents with  . Sore Throat    The history is provided by the patient. No language interpreter was used.   HPI Comments: Johnny [name] is a 52 y.o. male who presents to the Emergency Department complaining of sore throat that started 2 days ago. Pt reports left-sided jaw pain, left ear pain, a subjective fever that has subsided and nasal congestion as associated symptoms over the past 2 days. Pt states that he is able to swallow, but with pain. He also states that his mouth is watering excessively, and he is spitting into a cup throughout the physician-patient interview. Pt states that his throat pain is worsened with fully extending his jaw. He states that he has not taken Tylenol or Ibuprofen to improve symptoms, due to his history of kidney disease. He denies sick contacts or recent travel. He denies cough, SOB, nausea, vomiting or any other symptoms.   Past Medical History  Diagnosis Date  . Hypertension   . Chronic kidney disease   . COPD (chronic obstructive pulmonary disease)   . Liver disease    Past Surgical History  Procedure Laterality Date  . Av fistula placement  09/12/2009    Left arm AVF   Family History  Problem Relation Age of Onset  . Hypertension Mother   . Diabetes Mother    History  Substance Use Topics  . Smoking status: Former Smoker -- 0.10 packs/day    Types: Cigarettes    Quit date: 04/02/2011  . Smokeless tobacco: Never Used     Comment: trying to quit  . Alcohol Use: 0.6 oz/week    1 Cans of beer per week    Review of Systems  Constitutional: Positive for fever (subjective).  HENT: Positive for ear pain (Left), congestion, sore throat, drooling, trouble  swallowing (Pain with swallowing) and voice change.   Respiratory: Negative for cough and shortness of breath.   Gastrointestinal: Negative for nausea and vomiting.  All other systems reviewed and are negative.   Allergies  Heparin and Naproxen  Home Medications   Current Outpatient Rx  Name  Route  Sig  Dispense  Refill  . amLODipine (NORVASC) 10 MG tablet   Oral   Take 10 mg by mouth daily.           . furosemide (LASIX) 40 MG tablet   Oral   Take 40 mg by mouth daily.           [name] Kitchen labetalol (NORMODYNE) 200 MG tablet   Oral   Take 600 mg by mouth 2 (two) times daily.         . clindamycin (CLEOCIN) 75 MG/5ML solution   Oral   Take 20 mLs (300 mg total) by mouth 4 (four) times daily.   100 mL   0    Triage Vitals: BP 133/82  Pulse 88  Temp(Src) 99.2 F (37.3 C) (Oral)  Resp 18  SpO2 95%  Physical Exam  Nursing note and vitals reviewed. Constitutional: He appears well-developed and well-nourished.  Pt appears unwell. Occasionally spitting saliva into a cup.  HENT:  Head: Normocephalic and atraumatic.  Right Ear: Hearing, tympanic membrane, external ear and ear  canal normal.  Left Ear: Hearing, tympanic membrane, external ear and ear canal normal.  Nose: Nose normal.  Mouth/Throat: Mucous membranes are normal. Posterior oropharyngeal edema, posterior oropharyngeal erythema and tonsillar abscesses present.    Trismus, significant bilateral swelling with uvula pushed towards right side of oropharynx.   Eyes: Conjunctivae are normal. No scleral icterus.  Neck: Normal range of motion.  Tenderness under the mandible on the left.  Cardiovascular: Normal rate, regular rhythm and normal heart sounds.   Pulmonary/Chest: Effort normal and breath sounds normal. No respiratory distress. He has no wheezes. He has no rales. He exhibits no tenderness.  No respiratory distress.   Abdominal: Soft. Bowel sounds are normal. He exhibits no distension and no mass. There is no  tenderness. There is no rebound and no guarding.  Musculoskeletal: Normal range of motion.  Neurological: He is alert.  Skin: Skin is warm and dry.    ED Course  Procedures (including critical care time)  DIAGNOSTIC STUDIES: Oxygen Saturation is 95% on room air, adequate by my interpretation.    COORDINATION OF CARE: 6:04 PM- Discussed suspicion of tonsillar abscess.  Discussed plan to test for Strep and consult attending provider and ENT. Discussed that imaging may be needed for his throat. Pt agreed to plan.   Labs Review Labs Reviewed  RAPID STREP SCREEN  CULTURE, GROUP A STREP   Imaging Review No results found.  MDM   1. Peritonsillar abscess    Obvious peritonsillar abscess on left side.  Pt able to breath w/o difficulty however needing to spit into cup periodically as it is very painful for pt to swallow.  Consulted Dr. Benjamine Mola who advised to give pt Decadron 20mg , Clindamycin  900mg  IV.  Pt is to be discharged home 9:05 PM Pt is able to swallow liquids at this time.  Was seen by Dr. Benjamine Mola in the ED, will f/u with him in the office on Tuesday, 9/9.  Clindamycin solution 300mg  QID for 10 days.  Return precautions provided.  Pt verbalized understanding and agreement with tx plan.   I personally performed the services described in this documentation, which was scribed in my presence. The recorded information has been reviewed and is accurate.   Noland Fordyce, PA-C 07/12/13 2106

## 2013-07-13 NOTE — ED Provider Notes (Signed)
Medical screening examination/treatment/procedure(s) were performed by non-physician practitioner and as supervising physician I was immediately available for consultation/collaboration.  Delora Fuel, MD [phone]

## 2013-07-14 LAB — CULTURE, GROUP A STREP

## 2013-11-26 ENCOUNTER — Encounter: Payer: Self-pay | Admitting: Gastroenterology

## 2014-01-08 ENCOUNTER — Ambulatory Visit (AMBULATORY_SURGERY_CENTER): Payer: Medicaid Other

## 2014-01-08 VITALS — Ht 71.5 in | Wt 236.4 lb

## 2014-01-08 DIAGNOSIS — Z1211 Encounter for screening for malignant neoplasm of colon: Secondary | ICD-10-CM

## 2014-01-08 MED ORDER — MOVIPREP 100 G PO SOLR: 1.0000 | Freq: Once | ORAL | Status: DC

## 2014-01-14 ENCOUNTER — Ambulatory Visit (AMBULATORY_SURGERY_CENTER): Payer: Medicaid Other | Admitting: Gastroenterology

## 2014-01-14 ENCOUNTER — Encounter: Payer: Self-pay | Admitting: Gastroenterology

## 2014-01-14 VITALS — BP 157/92 | HR 59 | Temp 97.7°F | Resp 22

## 2014-01-14 DIAGNOSIS — D126 Benign neoplasm of colon, unspecified: Secondary | ICD-10-CM

## 2014-01-14 DIAGNOSIS — Z1211 Encounter for screening for malignant neoplasm of colon: Secondary | ICD-10-CM

## 2014-01-14 DIAGNOSIS — K5730 Diverticulosis of large intestine without perforation or abscess without bleeding: Secondary | ICD-10-CM

## 2014-01-14 MED ORDER — SODIUM CHLORIDE 0.9 % IV SOLN: 500.0000 mL | INTRAVENOUS | Status: DC

## 2014-01-14 MED ORDER — FLEET ENEMA 7-19 GM/118ML RE ENEM
1.0000 | ENEMA | Freq: Once | RECTAL | Status: AC
  Administered 2014-01-14: 1 via RECTAL

## 2014-01-14 NOTE — Progress Notes (Signed)
Propofol given over incremental dosages 

## 2014-01-14 NOTE — Patient Instructions (Signed)
YOU HAD AN ENDOSCOPIC PROCEDURE TODAY AT [facility]: Refer to the procedure report that was given to you for any specific questions about what was found during the examination.  If the procedure report does not answer your questions, please call your gastroenterologist to clarify.  If you requested that your care partner not be given the details of your procedure findings, then the procedure report has been included in a sealed envelope for you to review at your convenience later.  YOU SHOULD EXPECT: Some feelings of bloating in the abdomen. Passage of more gas than usual.  Walking can help get rid of the air that was put into your GI tract during the procedure and reduce the bloating. If you had a lower endoscopy (such as a colonoscopy or flexible sigmoidoscopy) you may notice spotting of blood in your stool or on the toilet paper. If you underwent a bowel prep for your procedure, then you may not have a normal bowel movement for a few days.  DIET: Your first meal following the procedure should be a light meal and then it is ok to progress to your normal diet.  A half-sandwich or bowl of soup is an example of a good first meal.  Heavy or fried foods are harder to digest and may make you feel nauseous or bloated.  Likewise meals heavy in dairy and vegetables can cause extra gas to form and this can also increase the bloating.  Drink plenty of fluids but you should avoid alcoholic beverages for 24 hours. Try to eat a high fiber diet to prevent Diverticulitis.  ACTIVITY: Your care partner should take you home directly after the procedure.  You should plan to take it easy, moving slowly for the rest of the day.  You can resume normal activity the day after the procedure however you should NOT DRIVE or use heavy machinery for 24 hours (because of the sedation medicines used during the test).    SYMPTOMS TO REPORT IMMEDIATELY: A gastroenterologist can be reached at any hour.  During normal  business hours, 8:30 AM to 5:00 PM Monday through Friday, call [phone].  After hours and on weekends, please call the GI answering service at [phone] who will take a message and have the physician on call contact you.   Following lower endoscopy (colonoscopy or flexible sigmoidoscopy):  Excessive amounts of blood in the stool  Significant tenderness or worsening of abdominal pains  Swelling of the abdomen that is new, acute  Fever of 100F or higher   FOLLOW UP: If any biopsies were taken you will be contacted by phone or by letter within the next 1-3 weeks.  Call your gastroenterologist if you have not heard about the biopsies in 3 weeks.  Our staff will call the home number listed on your records the next business day following your procedure to check on you and address any questions or concerns that you may have at that time regarding the information given to you following your procedure. This is a courtesy call and so if there is no answer at the home number and we have not heard from you through the emergency physician on call, we will assume that you have returned to your regular daily activities without incident.  SIGNATURES/CONFIDENTIALITY: You and/or your care partner have signed paperwork which will be entered into your electronic medical record.  These signatures attest to the fact that that the information above on your After Visit Summary has been reviewed  and is understood.  Full responsibility of the confidentiality of this discharge information lies with you and/or your care-partner. 

## 2014-01-14 NOTE — Progress Notes (Signed)
Yellow cloudy results, no formed stool, all liquid.

## 2014-01-14 NOTE — Op Note (Signed)
[name] [place], [id number]   COLONOSCOPY PROCEDURE REPORT  PATIENT: Johnny [name], Johnny [name]  MR#: [id number] BIRTHDATE: Jul 23, 1961 , 52  yrs. old GENDER: Male ENDOSCOPIST: Inda Castle, MD REFERRED [id number] McPherson, M.D. PROCEDURE DATE:  01/14/2014 PROCEDURE:   Colonoscopy with cold biopsy polypectomy First Screening Colonoscopy - Avg.  risk and is 50 yrs.  old or older Yes.  Prior Negative Screening - Now for repeat screening. N/A  History of Adenoma - Now for follow-up colonoscopy & has been > or = to 3 yrs.  N/A  Polyps Removed Today? Yes. ASA CLASS:   Class II INDICATIONS:average risk screening. MEDICATIONS: MAC sedation, administered by CRNA and propofol (Diprivan) 250mg  IV  DESCRIPTION OF PROCEDURE:   After the risks benefits and alternatives of the procedure were thoroughly explained, informed consent was obtained.  A digital rectal exam revealed no abnormalities of the rectum.   The [id number]  endoscope was introduced through the anus and advanced to the terminal ileum which was intubated for a short distance. No adverse events experienced.   The quality of the prep was Suprep good  The instrument was then slowly withdrawn as the colon was fully examined.      COLON FINDINGS: A flat polyp measuring 1-2 mm in size was found at the cecum.  A polypectomy was performed with cold forceps.   A sessile polyp measuring 2 mm in size was found in the descending colon.  A polypectomy was performed with a cold snare.   Mild diverticulosis was noted in the sigmoid colon.   The colon mucosa was otherwise normal.  Retroflexed views revealed no abnormalities. The time to cecum=2 minutes 39 seconds.  Withdrawal time=11 minutes 28 seconds.  The scope was withdrawn and the procedure completed. COMPLICATIONS: There were no complications.  ENDOSCOPIC IMPRESSION: 1.   Flat polyp measuring 1-2 mm in size was found at the cecum; polypectomy  was performed with cold forceps 2.   Sessile polyp measuring 2 mm in size was found in the descending colon; polypectomy was performed with a cold snare 3.   Mild diverticulosis was noted in the sigmoid colon 4.   The colon mucosa was otherwise normal  RECOMMENDATIONS: If the polyp(s) removed today are proven to be adenomatous (pre-cancerous) polyps, you will need a repeat colonoscopy in 5 years.  Otherwise you should continue to follow colorectal cancer screening guidelines for "routine risk" patients with colonoscopy in 10 years.  You will receive a letter within 1-2 weeks with the results of your biopsy as well as final recommendations.  Please call my office if you have not received a letter after 3 weeks.   eSigned:  Inda Castle, MD 01/14/2014 12:02 PM   cc:   PATIENT NAME:  [name], [name] MR#: [id number]

## 2014-01-14 NOTE — Progress Notes (Signed)
Patient stating on completion of prep, stools greenish brown liquid. Order received and enema self administered by patient.

## 2014-01-14 NOTE — Progress Notes (Signed)
Called to room to assist during endoscopic procedure.  Patient ID and intended procedure confirmed with present staff. Received instructions for my participation in the procedure from the performing physician.  

## 2014-01-15 ENCOUNTER — Telehealth: Payer: Self-pay | Admitting: *Deleted

## 2014-01-15 NOTE — Telephone Encounter (Signed)
  Follow up Call-  Call back number 01/14/2014  Post procedure Call Back phone  # [phone]  Permission to leave phone message Yes     Patient questions:  Do you have a fever, pain , or abdominal swelling? no Pain Score  0 *  Have you tolerated food without any problems? yes  Have you been able to return to your normal activities? yes  Do you have any questions about your discharge instructions: Diet   no Medications  no Follow up visit  no  Do you have questions or concerns about your Care? no  Actions: * If pain score is 4 or above: No action needed, pain <4.

## 2014-01-20 ENCOUNTER — Encounter: Payer: Self-pay | Admitting: Gastroenterology

## 2014-08-04 ENCOUNTER — Ambulatory Visit (INDEPENDENT_AMBULATORY_CARE_PROVIDER_SITE_OTHER): Payer: Medicaid Other | Admitting: Podiatrist

## 2014-08-04 ENCOUNTER — Encounter: Payer: Self-pay | Admitting: Podiatrist

## 2014-08-04 ENCOUNTER — Ambulatory Visit (INDEPENDENT_AMBULATORY_CARE_PROVIDER_SITE_OTHER): Payer: Medicaid Other

## 2014-08-04 VITALS — BP 112/67 | HR 57 | Resp 16 | Ht 73.0 in | Wt 224.0 lb

## 2014-08-04 DIAGNOSIS — Q6650 Congenital pes planus, unspecified foot: Secondary | ICD-10-CM

## 2014-08-04 DIAGNOSIS — M722 Plantar fascial fibromatosis: Secondary | ICD-10-CM

## 2014-08-04 DIAGNOSIS — D3610 Benign neoplasm of peripheral nerves and autonomic nervous system, unspecified: Secondary | ICD-10-CM

## 2014-08-04 MED ORDER — CICLOPIROX 8 % EX SOLN: Freq: Every day | CUTANEOUS | Status: DC

## 2014-08-04 MED ORDER — DICLOFENAC SODIUM 1 % TD GEL: 2.0000 g | Freq: Four times a day (QID) | TRANSDERMAL | Status: DC

## 2014-08-04 NOTE — Progress Notes (Signed)
   Subjective:    Patient ID: Johnny [name], male    DOB: July 13, 1961, 53 y.o.   MRN: [id number]  HPI Comments: i have right heel pain. It also swells. In the mornings are bad. i have tried switching shoes. If i wear the same shoe for to long, my foot will hurt. My 3rd 4th and 5th toes are numb. This has been going on for 2-3 months. My foot has gotten better. Besides switching shoes i havent done anything for my foot.  Foot Pain      Review of Systems  Cardiovascular: Positive for leg swelling.  Endocrine:       Increase urinataion  Musculoskeletal:       Joint pain Muscle pain   All other systems reviewed and are negative.      Objective:   Physical Exam Patient is awake, alert, and oriented x 3.  In no acute distress.  Vascular status is intact with palpable pedal pulses at 2/4 DP and PT bilateral and capillary refill time within normal limits. Neurological sensation is also intact bilaterally via Semmes Weinstein monofilament at 5/5 sites. Light touch, vibratory sensation, Achilles tendon reflex is intact. Dermatological exam reveals skin color, turger and texture as normal. No open lesions present.  Musculature intact with dorsiflexion, plantarflexion, inversion, eversion.  Mild pain on palpation plantar medial aspect of the right heel is noted consistent with plantar fasciitis. Significant pes planovalgus deformity is noted bilateral. Numbness is subjectively noted at the fifth digit moving medially. Mild palpable click at the third interspace of the right foot is noted consistent for probable neuroma. This is mild in nature and not painful.      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 Discussed that stretching exercises and instructions for shoe gear choices and options for treating neuromas and plantar fasciitis. At today's visit we will start him on anti-inflammatory gel consisting of  Voltaren. If there is no improvement in symptoms with conservative care she'll be seen back and we will consider injection therapy.

## 2014-08-04 NOTE — Patient Instructions (Signed)
Plantar Fasciitis (Heel Spur Syndrome) with Rehab The plantar fascia is a fibrous, ligament-like, soft-tissue structure that spans the bottom of the foot. Plantar fasciitis is a condition that causes pain in the foot due to inflammation of the tissue. SYMPTOMS   Pain and tenderness on the underneath side of the foot.  Pain that worsens with standing or walking. CAUSES  Plantar fasciitis is caused by irritation and injury to the plantar fascia on the underneath side of the foot. Common mechanisms of injury include:  Direct trauma to bottom of the foot.  Damage to a small nerve that runs under the foot where the main fascia attaches to the heel bone. Stress placed on the plantar fascia due to any mild increased activity or injury RISK INCREASES WITH:   Obesity.  Poor strength and flexibility.  Improperly fitted shoes.  Tight calf muscles.  Flat feet.  Failure to warm-up properly before activity.  PREVENTION  Warm up and stretch properly before activity.  Strength, flexibility  Maintain a health body weight.  Avoid stress on the plantar fascia.  Wear properly fitted shoes, including arch supports for individuals who have flat feet. PROGNOSIS  If treated properly, then the symptoms of plantar fasciitis usually resolve without surgery. However, occasionally surgery is necessary. RELATED COMPLICATIONS   Recurrent symptoms that may result in a chronic condition.  Problems of the lower back that are caused by compensating for the injury, such as limping.  Pain or weakness of the foot during push-off following surgery.  Chronic inflammation, scarring, and partial or complete fascia tear, occurring more often from repeated injections. TREATMENT  Treatment initially involves the use of ice and medication to help reduce pain and inflammation. The use of strengthening and stretching exercises may help reduce pain with activity, especially stretches of the Achilles tendon.  Your  caregiver may recommend that you use arch supports to help reduce stress on the plantar fascia. Often, corticosteroid injections are given to reduce inflammation. If symptoms persist for greater than 6 months despite non-surgical (conservative), then surgery may be recommended.  MEDICATION   If pain medication is necessary, then nonsteroidal anti-inflammatory medications, such as aspirin and ibuprofen, or other minor pain relievers, such as acetaminophen, are often recommended. Corticosteroid injections may be given by your caregiver.  HEAT AND COLD  Cold treatment (icing) relieves pain and reduces inflammation. Cold treatment should be applied for 10 to 15 minutes every 2 to 3 hours for inflammation and pain and immediately after any activity that aggravates your symptoms. Use ice packs or massage the area with a piece of ice (ice massage).  Heat treatment may be used prior to performing the stretching and strengthening activities prescribed by your caregiver, physical therapist, or athletic trainer. Use a heat pack or soak the injury in warm water. SEEK IMMEDIATE MEDICAL CARE IF:  Treatment seems to offer no benefit, or the condition worsens.  Any medications produce adverse side effects.    EXERCISES-- perform each exercise a total of 10-15 repetitions.  Hold for 30 seconds and perform 3 times per day   RANGE OF MOTION (ROM) AND STRETCHING EXERCISES - Plantar Fasciitis (Heel Spur Syndrome) These exercises may help you when beginning to rehabilitate your injury.   While completing these exercises, remember:   Restoring tissue flexibility helps normal motion to return to the joints. This allows healthier, less painful movement and activity.  An effective stretch should be held for at least 30 seconds.  A stretch should never be painful. You   should only feel a gentle lengthening or release in the stretched tissue. RANGE OF MOTION - Toe Extension, Flexion  Sit with your right / left  leg crossed over your opposite knee.  Grasp your toes and gently pull them back toward the top of your foot. You should feel a stretch on the bottom of your toes and/or foot.  Hold this stretch for __________ seconds.  Now, gently pull your toes toward the bottom of your foot. You should feel a stretch on the top of your toes and or foot.  Hold this stretch for __________ seconds. Repeat __________ times. Complete this stretch __________ times per day.  RANGE OF MOTION - Ankle Dorsiflexion, Active Assisted  Remove shoes and sit on a chair that is preferably not on a carpeted surface.  Place right / left foot under knee. Extend your opposite leg for support.  Keeping your heel down, slide your right / left foot back toward the chair until you feel a stretch at your ankle or calf. If you do not feel a stretch, slide your bottom forward to the edge of the chair, while still keeping your heel down.  Hold this stretch for __________ seconds. Repeat __________ times. Complete this stretch __________ times per day.  STRETCH  Gastroc, Standing  Place hands on wall.  Extend right / left leg, keeping the front knee somewhat bent.  Slightly point your toes inward on your back foot.  Keeping your right / left heel on the floor and your knee straight, shift your weight toward the wall, not allowing your back to arch.  You should feel a gentle stretch in the right / left calf. Hold this position for __________ seconds. Repeat __________ times. Complete this stretch __________ times per day. STRETCH  Soleus, Standing  Place hands on wall.  Extend right / left leg, keeping the other knee somewhat bent.  Slightly point your toes inward on your back foot.  Keep your right / left heel on the floor, bend your back knee, and slightly shift your weight over the back leg so that you feel a gentle stretch deep in your back calf.  Hold this position for __________ seconds. Repeat __________ times.  Complete this stretch __________ times per day. STRETCH  Gastrocsoleus, Standing  Note: This exercise can place a lot of stress on your foot and ankle. Please complete this exercise only if specifically instructed by your caregiver.   Place the ball of your right / left foot on a step, keeping your other foot firmly on the same step.  Hold on to the wall or a rail for balance.  Slowly lift your other foot, allowing your body weight to press your heel down over the edge of the step.  You should feel a stretch in your right / left calf.  Hold this position for __________ seconds.  Repeat this exercise with a slight bend in your right / left knee. Repeat __________ times. Complete this stretch __________ times per day.  STRENGTHENING EXERCISES - Plantar Fasciitis (Heel Spur Syndrome)  These exercises may help you when beginning to rehabilitate your injury. They may resolve your symptoms with or without further involvement from your physician, physical therapist or athletic trainer. While completing these exercises, remember:   Muscles can gain both the endurance and the strength needed for everyday activities through controlled exercises.  Complete these exercises as instructed by your physician, physical therapist or athletic trainer. Progress the resistance and repetitions only as guided.  Morton's  Neuroma in Sports  (Interdigital Plantar Neuroma) Morton's neuroma is a condition of the nervous system that results in pain or loss of feeling in the toes. The disease is caused by the bones of the foot squeezing the nerve that runs between two toes (interdigital nerve). The third and fourth toes are most likely to be affected by this disease. SYMPTOMS   Tingling, numbness, burning, or electric shocks in the front of the foot, often involving the third and fourth toes, although it may involve any other pair of toes.  Pain and tenderness in the front of the foot, that gets worse when  walking.  Pain that gets worse when pressure is applied to the foot (wearing shoes).  Severe pain in the front of the foot, when standing on the front of the foot (on tiptoes), such as with running, jumping, pivoting, or dancing. CAUSES  Morton's neuroma is caused by swelling of the nerve between two toes. This swelling causes the nerve to be pinched between the bones of the foot. RISK INCREASES WITH:  Recurring foot or ankle injuries.  Poor fitting or worn shoes, with minimal padding and shock absorbers.  Loose ligaments of the foot, causing thickening of the nerve.  Poor foot strength and flexibility. PREVENTION  Warm up and stretch properly before activity.  Maintain physical fitness:  Foot and ankle flexibility.  Muscle strength and endurance.  Cardiovascular fitness.  Wear properly fitted and padded shoes.  Wear arch supports (orthotics), when needed. PROGNOSIS  If treated properly, Morton's neuroma can usually be cured with non-surgical treatment. For certain cases, surgery may be needed. RELATED COMPLICATIONS  Permanent numbness and pain in the foot.  Inability to participate in athletics, because of pain. TREATMENT Treatment first involves stopping any activities that make the symptoms worse. The use of ice and medicine will help reduce pain and inflammation. Wearing shoes with a wide toe box, and an orthotic arch support or metatarsal bar, may also reduce pain. Your caregiver may give you a corticosteroid injection, to further reduce inflammation. If non-surgical treatment is unsuccessful, surgery may be needed. Surgery to fix Morton's neuroma is often performed as an outpatient procedure, meaning you can go home the same day as the surgery. The procedure involves removing the source of pressure on the nerve. If it is necessary to remove the nerve, you can expect persistent numbness. MEDICATION  If pain medicine is needed, nonsteroidal anti-inflammatory medicines  (aspirin and ibuprofen), or other minor pain relievers (acetaminophen), are often advised.  Do not take pain medicine for 7 days before surgery.  Prescription pain relievers are usually prescribed only after surgery. Use only as directed and only as much as you need.  Corticosteroid injections are used in extreme cases, to reduce inflammation. These injections should be done only if necessary, because they may be given only a limited number of times. HEAT AND COLD  Cold treatment (icing) should be applied for 10 to 15 minutes every 2 to 3 hours for inflammation and pain, and immediately after activity that aggravates your symptoms. Use ice packs or an ice massage.  Heat treatment may be used before performing stretching and strengthening activities prescribed by your caregiver, physical therapist, or athletic trainer. Use a heat pack or a warm water soak. SEEK MEDICAL CARE IF:   Symptoms get worse or do not improve in 2 weeks, despite treatment.  After surgery you develop increasing pain, swelling, redness, increased warmth, bleeding, drainage of fluids, or fever.  New, unexplained symptoms develop. (  Drugs used in treatment may produce side effects.) Document Released: 08/29/2005 Document Revised: 01/14/2012 Document Reviewed: 02/03/2009 [facility] Patient Information 2015 [place], [place]. This information is not intended to replace advice given to you by your health care provider. Make sure you discuss any questions you have with your health care provider.

## 2015-04-04 ENCOUNTER — Encounter (HOSPITAL_COMMUNITY): Payer: Self-pay | Admitting: Emergency Medicine

## 2015-04-04 ENCOUNTER — Emergency Department (INDEPENDENT_AMBULATORY_CARE_PROVIDER_SITE_OTHER): Payer: Worker's Compensation

## 2015-04-04 ENCOUNTER — Emergency Department (INDEPENDENT_AMBULATORY_CARE_PROVIDER_SITE_OTHER)
Admission: EM | Admit: 2015-04-04 | Discharge: 2015-04-04 | Disposition: A | Discharge status: Home / Self Care | Payer: Worker's Compensation | Source: Home / Self Care | Attending: Family Medicine | Admitting: Family Medicine

## 2015-04-04 DIAGNOSIS — S46002A Unspecified injury of muscle(s) and tendon(s) of the rotator cuff of left shoulder, initial encounter: Secondary | ICD-10-CM

## 2015-04-04 MED ORDER — TRAMADOL HCL 50 MG PO TABS: 50.0000 mg | ORAL_TABLET | Freq: Four times a day (QID) | ORAL | Status: DC | PRN

## 2015-04-04 NOTE — ED Provider Notes (Signed)
Johnny [name] is a 54 y.o. male who presents to Urgent Care today for left shoulder pain. Patient was pushing a large heavy object at work today. The object collided with another object. He felt immediate pain in his left shoulder. He has difficulty raising his left arm over his head due to pain. He denies any radiating pain weakness or numbness. He has not tried any medications. He feels well otherwise. Of note he has a left AV fistula in his left arm. He does not receive dialysis.   Past Medical History  Diagnosis Date  . Hypertension   . Chronic kidney disease   . COPD (chronic obstructive pulmonary disease)   . Liver disease    Past Surgical History  Procedure Laterality Date  . Av fistula placement  09/12/2009    Left arm AVF   History  Substance Use Topics  . Smoking status: Former Smoker -- 0.10 packs/day    Types: Cigarettes    Quit date: 04/02/2011  . Smokeless tobacco: Never Used     Comment: trying to quit  . Alcohol Use: No   ROS as above Medications: No current facility-administered medications for this encounter.   Current Outpatient Prescriptions  Medication Sig Dispense Refill  . amLODipine (NORVASC) 10 MG tablet Take 10 mg by mouth daily.      . ciclopirox (PENLAC) 8 % solution Apply topically at bedtime. 6.6 mL 3  . diclofenac sodium (VOLTAREN) 1 % GEL Apply 2 g topically 4 (four) times daily. Rub into affected area of foot 2 to 4 times daily 100 g 2  . furosemide (LASIX) 40 MG tablet Take 40 mg by mouth daily.      [name] Kitchen labetalol (NORMODYNE) 200 MG tablet Take 600 mg by mouth 2 (two) times daily.    . traMADol (ULTRAM) 50 MG tablet Take 1 tablet (50 mg total) by mouth every 6 (six) hours as needed. 15 tablet 0   Allergies  Allergen Reactions  . Heparin   . Naproxen      Exam:  BP 132/86 mmHg  Pulse 64  Temp(Src) 97.1 F (36.2 C) (Oral)  Resp 20  SpO2 95% Gen: Well NAD HEENT: EOMI,  MMM Lungs: Normal work of breathing. CTABL Heart: RRR no MRG Abd:  NABS, Soft. Nondistended, Nontender Exts: Brisk capillary refill, warm and well perfused.  Shoulders nontender bilaterally. Left shoulder range of motion 0-110 abduction area painful arc. Normal external range of motion Impingement not tested due to pain. Strength 4+/5 for flexion left arm compared to 5/5 right. Pain with abduction strength testing.  No results found for this or any previous visit (from the past 24 hour(s)). Dg Shoulder Left  04/04/2015   CLINICAL DATA:  Injury to left shoulder, with left shoulder pain and difficulty moving arm. Initial encounter.  EXAM: LEFT SHOULDER - 2+ VIEW  COMPARISON:  None.  FINDINGS: There is no evidence of fracture or dislocation. The left humeral head is seated within the glenoid fossa. The acromioclavicular joint is unremarkable in appearance. No significant soft tissue abnormalities are seen. The visualized portions of the left lung are clear.  IMPRESSION: No evidence of fracture or dislocation.   Electronically Signed   By: Garald Balding M.D.   On: 04/04/2015 18:45    Assessment and Plan: 54 y.o. male with left shoulder rotator cuff injury. Tramadol for pain control. Avoid NSAIDs due to see daily. Follow-up with orthopedics.  Discussed warning signs or symptoms. Please see discharge instructions. Patient expresses understanding.  Gregor Hams, MD 04/04/15 1901

## 2015-04-04 NOTE — Discharge Instructions (Signed)
Thank you for coming in today. Use tramadol for pain as needed. Do not drive after taking this medicine. Follow-up with orthopedics  Rotator Cuff Injury Rotator cuff injury is any type of injury to the set of muscles and tendons that make up the stabilizing unit of your shoulder. This unit holds the ball of your upper arm bone (humerus) in the socket of your shoulder blade (scapula).  CAUSES Injuries to your rotator cuff most commonly come from sports or activities that cause your arm to be moved repeatedly over your head. Examples of this include throwing, weight lifting, swimming, or racquet sports. Long lasting (chronic) irritation of your rotator cuff can cause soreness and swelling (inflammation), bursitis, and eventual damage to your tendons, such as a tear (rupture). SIGNS AND SYMPTOMS Acute rotator cuff tear:  Sudden tearing sensation followed by severe pain shooting from your upper shoulder down your arm toward your elbow.  Decreased range of motion of your shoulder because of pain and muscle spasm.  Severe pain.  Inability to raise your arm out to the side because of pain and loss of muscle power (large tears). Chronic rotator cuff tear:  Pain that usually is worse at night and may interfere with sleep.  Gradual weakness and decreased shoulder motion as the pain worsens.  Decreased range of motion. Rotator cuff tendinitis:  Deep ache in your shoulder and the outside upper arm over your shoulder.  Pain that comes on gradually and becomes worse when lifting your arm to the side or turning it inward. DIAGNOSIS Rotator cuff injury is diagnosed through a medical history, physical exam, and imaging exam. The medical history helps determine the type of rotator cuff injury. Your health care provider will look at your injured shoulder, feel the injured area, and ask you to move your shoulder in different positions. X-ray exams typically are done to rule out other causes of shoulder  pain, such as fractures. MRI is the exam of choice for the most severe shoulder injuries because the images show muscles and tendons.  TREATMENT  Chronic tear:  Medicine for pain, such as acetaminophen or ibuprofen.  Physical therapy and range-of-motion exercises may be helpful in maintaining shoulder function and strength.  Steroid injections into your shoulder joint.  Surgical repair of the rotator cuff if the injury does not heal with noninvasive treatment. Acute tear:  Anti-inflammatory medicines such as ibuprofen and naproxen to help reduce pain and swelling.  A sling to help support your arm and rest your rotator cuff muscles. Long-term use of a sling is not advised. It may cause significant stiffening of the shoulder joint.  Surgery may be considered within a few weeks, especially in younger, active people, to return the shoulder to full function.  Indications for surgical treatment include the following:  Age younger than 26 years.  Rotator cuff tears that are complete.  Physical therapy, rest, and anti-inflammatory medicines have been used for 6-8 weeks, with no improvement.  Employment or sporting activity that requires constant shoulder use. Tendinitis:  Anti-inflammatory medicines such as ibuprofen and naproxen to help reduce pain and swelling.  A sling to help support your arm and rest your rotator cuff muscles. Long-term use of a sling is not advised. It may cause significant stiffening of the shoulder joint.  Severe tendinitis may require:  Steroid injections into your shoulder joint.  Physical therapy.  Surgery. HOME CARE INSTRUCTIONS   Apply ice to your injury:  Put ice in a plastic bag.  Place a  towel between your skin and the bag.  Leave the ice on for 20 minutes, 2-3 times a day.  If you have a shoulder immobilizer (sling and straps), wear it until told otherwise by your health care provider.  You may want to sleep on several pillows or in a  recliner at night to lessen swelling and pain.  Only take over-the-counter or prescription medicines for pain, discomfort, or fever as directed by your health care provider.  Do simple hand squeezing exercises with a soft rubber ball to decrease hand swelling. SEEK MEDICAL CARE IF:   Your shoulder pain increases, or new pain or numbness develops in your arm, hand, or fingers.  Your hand or fingers are colder than your other hand. SEEK IMMEDIATE MEDICAL CARE IF:   Your arm, hand, or fingers are numb or tingling.  Your arm, hand, or fingers are increasingly swollen and painful, or they turn white or blue. MAKE SURE YOU:  Understand these instructions.  Will watch your condition.  Will get help right away if you are not doing well or get worse. Document Released: 10/19/2000 Document Revised: 10/27/2013 Document Reviewed: 06/03/2013 [facility] Patient Information 2015 [place], [place]. This information is not intended to replace advice given to you by your health care provider. Make sure you discuss any questions you have with your health care provider.

## 2015-04-04 NOTE — ED Notes (Addendum)
Patient c/o hurting his left shoulder while unloading a truck today. He states he was pushing a cart and the ramp of the truck was not all the way down and he crashed into it. He mostly feels pain in his shoulder. Patient reports injury happened around 1 or 2 pm today. He has not taken any pain medication. Patient is in NAD.

## 2015-06-06 ENCOUNTER — Other Ambulatory Visit: Payer: Self-pay | Admitting: Family Medicine

## 2015-06-09 ENCOUNTER — Encounter: Payer: Self-pay | Admitting: Gastroenterology

## 2015-06-29 ENCOUNTER — Encounter: Payer: Self-pay | Admitting: Neurology

## 2015-06-29 ENCOUNTER — Ambulatory Visit (INDEPENDENT_AMBULATORY_CARE_PROVIDER_SITE_OTHER): Payer: Medicaid Other | Admitting: Neurology

## 2015-06-29 VITALS — BP 130/83 | HR 64 | Ht 73.0 in | Wt 223.0 lb

## 2015-06-29 DIAGNOSIS — R400 Somnolence: Secondary | ICD-10-CM

## 2015-06-29 DIAGNOSIS — R5382 Chronic fatigue, unspecified: Secondary | ICD-10-CM

## 2015-06-29 DIAGNOSIS — N189 Chronic kidney disease, unspecified: Secondary | ICD-10-CM

## 2015-06-29 NOTE — Progress Notes (Signed)
PATIENT: Johnny [name] DOB: [birth year]  No chief complaint on file.    HISTORICAL  Johnny [name] is a 54 years old right-handed male, seen in refer by  Elizabeth Palau, MD for evaluation of excessive daytime sleepiness, fatigue  He had a past medical history of asthma, previous smoker, now with COPD, chronic renal disease, status post left AV fistula placement but never required dialysis, also had a history of hepatitis C, hypertension  He is now working part-time for [employer], part-time for school since 2014, he noticed difficulty focusing, staying awake to complete his schoolwork, about 4 years ago, in 2012, he had similar excessive sleepiness, fatigue, fell over the table while playing chess, it was due to clonidine, his symptoms has much improved after clonidine was stopped,   But since he started to school, he continued to struggle with excessive daytime sleepiness, focusing on his schoolwork, he also has frequent nocturia, he woke up average 3-5 times each night.  He complains of right ankle pain, mild gait difficulty, he also complains of neck pain  REVIEW OF SYSTEMS: Full 14 system review of systems performed and notable only for fatigue, blurred vision, not enough sleep excessive daytime sleepiness   ALLERGIES: Allergies  Allergen Reactions  . Heparin   . Naproxen     HOME MEDICATIONS: Current Outpatient Prescriptions  Medication Sig Dispense Refill  . amLODipine (NORVASC) 10 MG tablet Take 10 mg by mouth daily.      . ciclopirox (PENLAC) 8 % solution Apply topically at bedtime. 6.6 mL 3  . diclofenac sodium (VOLTAREN) 1 % GEL Apply 2 g topically 4 (four) times daily. Rub into affected area of foot 2 to 4 times daily 100 g 2  . furosemide (LASIX) 40 MG tablet Take 40 mg by mouth daily.      [name] Kitchen labetalol (NORMODYNE) 200 MG tablet Take 600 mg by mouth 2 (two) times daily.    . traMADol (ULTRAM) 50 MG tablet Take 1 tablet (50 mg total) by mouth every 6 (six)  hours as needed. 15 tablet 0   No current facility-administered medications for this visit.    PAST MEDICAL HISTORY: Past Medical History  Diagnosis Date  . Hypertension   . Chronic kidney disease   . COPD (chronic obstructive pulmonary disease)   . Liver disease     PAST SURGICAL HISTORY: Past Surgical History  Procedure Laterality Date  . Av fistula placement  09/12/2009    Left arm AVF    FAMILY HISTORY: Family History  Problem Relation Age of Onset  . Hypertension Mother   . Diabetes Mother   . Colon cancer Neg Hx     SOCIAL HISTORY:  Social History   Social History  . Marital Status: Legally Separated    Spouse Name: N/A  . Number of Children: N/A  . Years of Education: N/A   Occupational History  . Not on file.   Social History Main Topics  . Smoking status: Former Smoker -- 0.10 packs/day    Types: Cigarettes    Quit date: 04/02/2011  . Smokeless tobacco: Never Used     Comment: trying to quit  . Alcohol Use: No  . Drug Use: No  . Sexual Activity: Not on file   Other Topics Concern  . Not on file   Social History Narrative     PHYSICAL EXAM   There were no vitals filed for this visit.  Not recorded  There is no weight on file to calculate BMI.  PHYSICAL EXAMNIATION:  Gen: NAD, conversant, well nourised, obese, well groomed                     Cardiovascular: Regular rate rhythm, no peripheral edema, warm, nontender. Eyes: Conjunctivae clear without exudates or hemorrhage Neck: Supple, no carotid bruise. Pulmonary: Clear to auscultation bilaterally   NEUROLOGICAL EXAM:  MENTAL STATUS: Speech:    Speech is normal; fluent and spontaneous with normal comprehension.  Cognition:     Orientation to time, place and person     Normal recent and remote memory     Normal Attention span and concentration     Normal Language, naming, repeating,spontaneous speech     Fund of knowledge   CRANIAL NERVES: CN II: Visual fields are full  to confrontation. Fundoscopic exam is normal with sharp discs and no vascular changes. Pupils are round equal and briskly reactive to light. CN III, IV, VI: extraocular movement are normal. No ptosis. CN V: Facial sensation is intact to pinprick in all 3 divisions bilaterally. Corneal responses are intact.  CN VII: Face is symmetric with normal eye closure and smile. CN VIII: Hearing is normal to rubbing fingers CN IX, X: Palate elevates symmetrically. Phonation is normal. CN XI: Head turning and shoulder shrug are intact CN XII: Tongue is midline with normal movements and no atrophy.  MOTOR: There is no pronator drift of out-stretched arms. Muscle bulk and tone are normal. Muscle strength is normal.  REFLEXES: Reflexes are 2+ and symmetric at the biceps, triceps, knees, and ankles. Plantar responses are flexor.  SENSORY: Intact to light touch, pinprick, position sense, and vibration sense are intact in fingers and toes.  COORDINATION: Rapid alternating movements and fine finger movements are intact. There is no dysmetria on finger-to-nose and heel-knee-shin.    GAIT/STANCE: Posture is normal. Gait is steady with normal steps, base, arm swing, and turning. Heel and toe walking are normal. Mild difficulty with tandem walking Romberg is absent.   DIAGNOSTIC DATA (LABS, IMAGING, TESTING) - I reviewed patient records, labs, notes, testing and imaging myself where available.   ASSESSMENT AND PLAN  Johnny [name] is a 54 y.o. male   Excessive daytime sleepiness, fatigue   Multifactorial, chronic renal disease, poor sleep quality frequent nocturia at nighttime, medicine side effect.  MRI of the brain with his reported multiple vascular risk factor  Laboratory record from his primary care physician    Marcial Pacas, M.D. Ph.D.  [facility] [address], [place], [place] [address] Ph: [phone] Fax: [phone]  CC: Georgiann Mohs

## 2015-07-02 ENCOUNTER — Emergency Department (INDEPENDENT_AMBULATORY_CARE_PROVIDER_SITE_OTHER): Payer: Medicaid Other

## 2015-07-02 ENCOUNTER — Encounter (HOSPITAL_COMMUNITY): Payer: Self-pay | Admitting: Emergency Medicine

## 2015-07-02 ENCOUNTER — Emergency Department (HOSPITAL_COMMUNITY)
Admission: EM | Admit: 2015-07-02 | Discharge: 2015-07-02 | Disposition: A | Discharge status: Home / Self Care | Payer: Medicaid Other | Source: Home / Self Care | Attending: Family Medicine | Admitting: Family Medicine

## 2015-07-02 DIAGNOSIS — M10071 Idiopathic gout, right ankle and foot: Secondary | ICD-10-CM | POA: Diagnosis not present

## 2015-07-02 LAB — BASIC METABOLIC PANEL
ANION GAP: 10 (ref 5–15)
BUN: 46 mg/dL — ABNORMAL HIGH (ref 6–20)
CHLORIDE: 99 mmol/L — AB (ref 101–111)
CO2: 26 mmol/L (ref 22–32)
Calcium: 9.7 mg/dL (ref 8.9–10.3)
Creatinine, Ser: 3.21 mg/dL — ABNORMAL HIGH (ref 0.61–1.24)
GFR calc non Af Amer: 21 mL/min — ABNORMAL LOW (ref >60)
GFR, EST AFRICAN AMERICAN: 24 mL/min — AB (ref >60)
GLUCOSE: 97 mg/dL (ref 65–99)
Potassium: 3.9 mmol/L (ref 3.5–5.1)
Sodium: 135 mmol/L (ref 135–145)

## 2015-07-02 LAB — URIC ACID: Uric Acid, Serum: 7.2 mg/dL (ref 4.4–7.6)

## 2015-07-02 MED ORDER — COLCHICINE 0.6 MG PO TABS: 0.6000 mg | ORAL_TABLET | Freq: Two times a day (BID) | ORAL | Status: DC

## 2015-07-02 NOTE — ED Provider Notes (Signed)
CSN: [id number]     Arrival date & time 07/02/15  1309 History   First MD Initiated Contact with Patient 07/02/15 1416     Chief Complaint  Patient presents with  . Ankle Pain   (Consider location/radiation/quality/duration/timing/severity/associated sxs/prior Treatment) Patient is a 54 y.o. male presenting with ankle pain. The history is provided by the patient.  Ankle Pain Location:  Ankle Injury: no   Ankle location:  R ankle Pain details:    Quality:  Shooting, throbbing and sharp   Severity:  Moderate   Onset quality:  Sudden   Progression:  Unchanged Chronicity:  New (h/o gout in toe but not ankle.) Dislocation: no     Past Medical History  Diagnosis Date  . Hypertension   . Chronic kidney disease   . COPD (chronic obstructive pulmonary disease)   . Liver disease    Past Surgical History  Procedure Laterality Date  . Av fistula placement  09/12/2009    Left arm AVF   Family History  Problem Relation Age of Onset  . Hypertension Mother   . Diabetes Mother   . Colon cancer Neg Hx    Social History  Substance Use Topics  . Smoking status: Former Smoker -- 0.10 packs/day    Types: Cigarettes    Quit date: 04/02/2011  . Smokeless tobacco: Never Used     Comment: trying to quit  . Alcohol Use: No    Review of Systems  Constitutional: Negative.   Musculoskeletal: Positive for joint swelling and gait problem.  Skin: Negative.     Allergies  Heparin and Naproxen  Home Medications   Prior to Admission medications   Medication Sig Start Date End Date Taking? Authorizing Provider  ALLOPURINOL PO Take by mouth.   Yes Historical Provider, MD  albuterol (PROVENTIL HFA;VENTOLIN HFA) 108 (90 BASE) MCG/ACT inhaler Inhale into the lungs every 6 (six) hours as needed for wheezing or shortness of breath.    Historical Provider, MD  colchicine 0.6 MG tablet Take 1 tablet (0.6 mg total) by mouth 2 (two) times daily. 07/02/15   Billy Fischer, MD  diclofenac sodium  (VOLTAREN) 1 % GEL Apply 2 g topically 4 (four) times daily. Rub into affected area of foot 2 to 4 times daily 08/04/14   Bronson Ing, DPM  furosemide (LASIX) 40 MG tablet Take 80 mg by mouth daily.     Historical Provider, MD  labetalol (NORMODYNE) 200 MG tablet Take 600 mg by mouth 2 (two) times daily.    Historical Provider, MD  traMADol (ULTRAM) 50 MG tablet Take 1 tablet (50 mg total) by mouth every 6 (six) hours as needed. 04/04/15   Gregor Hams, MD  triamterene-hydrochlorothiazide (DYAZIDE) 37.5-25 MG per capsule Take 1 capsule by mouth 2 (two) times daily.    Historical Provider, MD   Meds Ordered and Administered this Visit  Medications - No data to display  BP 126/76 mmHg  Pulse 67  Temp(Src) 98.5 F (36.9 C) (Oral)  Resp 16  SpO2 96% No data found.   Physical Exam  Constitutional: He is oriented to person, place, and time. He appears well-developed and well-nourished. No distress.  Musculoskeletal: He exhibits edema and tenderness.       Right ankle: He exhibits decreased range of motion and swelling. He exhibits no ecchymosis, no deformity and normal pulse. Tenderness. Lateral malleolus and medial malleolus tenderness found. Achilles tendon normal.  Neurological: He is alert and oriented to person, place, and time.  Skin: Skin is warm and dry.  Nursing note and vitals reviewed.   ED Course  Procedures (including critical care time)  Labs Review Labs Reviewed  BASIC METABOLIC PANEL  URIC ACID    Imaging Review Dg Ankle Complete Right  07/02/2015   CLINICAL DATA:  Medial right ankle pain for 1 week. No known injury.  EXAM: RIGHT ANKLE - COMPLETE 3+ VIEW  COMPARISON:  None.  FINDINGS: Spurring within the right ankle joint compatible with degenerative changes. No acute fracture, subluxation or dislocation. Soft tissues are intact.  IMPRESSION: Mild degenerative changes.  No acute bony abnormality.   Electronically Signed   By: Rolm Baptise M.D.   On: 07/02/2015  14:42   X-rays reviewed and report per radiologist.  Visual Acuity Review  Right Eye Distance:   Left Eye Distance:   Bilateral Distance:    Right Eye Near:   Left Eye Near:    Bilateral Near:         MDM   1. Acute idiopathic gout of right ankle        Billy Fischer, MD 07/02/15 1531

## 2015-07-02 NOTE — Discharge Instructions (Signed)
See your doctor next week to check on lab results and discuss medications

## 2015-07-02 NOTE — ED Notes (Signed)
Right ankle pain and foot pain that started Sunday.  Reports taking allopurinol starting Saturday.  No known injury.  History of gout and thinks this is what is going on now

## 2015-08-02 ENCOUNTER — Encounter: Payer: Self-pay | Admitting: Pulmonary Disease

## 2015-08-02 ENCOUNTER — Ambulatory Visit (INDEPENDENT_AMBULATORY_CARE_PROVIDER_SITE_OTHER): Payer: Medicaid Other | Admitting: Pulmonary Disease

## 2015-08-02 VITALS — BP 148/82 | HR 72 | Ht 73.0 in | Wt 227.2 lb

## 2015-08-02 DIAGNOSIS — G4733 Obstructive sleep apnea (adult) (pediatric): Secondary | ICD-10-CM | POA: Diagnosis not present

## 2015-08-02 NOTE — Patient Instructions (Signed)
Will arrange for sleep study Will call to arrange for follow up after sleep study reviewed 

## 2015-08-02 NOTE — Progress Notes (Signed)
Chief Complaint  Patient presents with  . SLEEP CONSULT    pt referred by Dr. Ollen Gross : pt states he has a lot of daytime sleeping. pt states he wakes up frequently during the night. pt wakes up restless. Epworht Score : 19    History of Present Illness: Johnny [name] is a 54 y.o. male for evaluation of sleep problems.  He has been noticing more trouble staying awake during the day.  He gets sleep around 2 pm.  He is in school, and has trouble staying awake in class.  He does snore, and has been told he stops breathing at night.  He has noticed waking up more often, and getting more frequent dreams.  He goes to sleep at 11 pm.  He falls asleep few minutes.  He wakes up 2 to 3 times to use the bathroom.  He gets out of bed at 6 am.  He feels okay in the morning, but gets more sleepy as the day goes on.  He denies morning headache.  He does not use anything to help him fall sleep or stay awake.  He denies sleep walking, sleep talking, bruxism, or nightmares.  There is no history of restless legs.  He denies sleep hallucinations, sleep paralysis, or cataplexy.  The Epworth score is 19 out of 24.   Johnny [name]  has a past medical history of Hypertension; Chronic kidney disease; COPD (chronic obstructive pulmonary disease); and Hepatitis C.  Johnny [name]  has past surgical history that includes AV fistula placement (09/12/2009).  Prior to Admission medications   Medication Sig Start Date End Date Taking? Authorizing Provider  albuterol (PROVENTIL HFA;VENTOLIN HFA) 108 (90 BASE) MCG/ACT inhaler Inhale into the lungs every 6 (six) hours as needed for wheezing or shortness of breath.   Yes Historical Provider, MD  ALLOPURINOL PO Take by mouth.   Yes Historical Provider, MD  colchicine 0.6 MG tablet Take 1 tablet (0.6 mg total) by mouth 2 (two) times daily. 07/02/15  Yes Billy Fischer, MD  diclofenac sodium (VOLTAREN) 1 % GEL Apply 2 g topically 4 (four) times daily. Rub into affected area of foot  2 to 4 times daily 08/04/14  Yes Bronson Ing, DPM  furosemide (LASIX) 40 MG tablet Take 80 mg by mouth daily.    Yes Historical Provider, MD  labetalol (NORMODYNE) 200 MG tablet Take 600 mg by mouth 2 (two) times daily.   Yes Historical Provider, MD  traMADol (ULTRAM) 50 MG tablet Take 1 tablet (50 mg total) by mouth every 6 (six) hours as needed. 04/04/15  Yes Gregor Hams, MD  triamterene-hydrochlorothiazide (DYAZIDE) 37.5-25 MG per capsule Take 1 capsule by mouth 2 (two) times daily.   Yes Historical Provider, MD    Allergies  Allergen Reactions  . Heparin   . Naproxen     His family history includes Diabetes in his mother; Hypertension in his mother. There is no history of Colon cancer.  He  reports that he quit smoking about 4 years ago. His smoking use included Cigarettes. He smoked 0.10 packs per day. He has never used smokeless tobacco. He reports that he does not drink alcohol or use illicit drugs.   Physical Exam: BP 148/82 mmHg  Pulse 72  Ht 6\' 1"  (1.854 m)  Wt 227 lb 3.2 oz (103.057 kg)  BMI 29.98 kg/m2  SpO2 100%  General - No distress ENT - No sinus tenderness, no oral exudate, no LAN, no thyromegaly,  TM clear, pupils equal/reactive, MP 3, enlarged tongue, 2+ tonsils Cardiac - s1s2 regular, no murmur, pulses symmetric Chest - No wheeze/rales/dullness, good air entry, normal respiratory excursion Back - No focal tenderness Abd - Soft, non-tender, no organomegaly, + bowel sounds Ext - No edema, AV graft Lt upper arm Neuro - Normal strength, cranial nerves intact Skin - No rashes Psych - Normal mood, and behavior  Discussion: He has snoring, sleep disruption, witnessed apnea, and daytime sleepiness.  He has hx of COPD, HTN, CKD, hepatitis C.  I am concerned he could have sleep apnea.  I have reviewed the recent sleep study results with the patient.  We discussed how sleep apnea can affect various health problems including risks for hypertension, cardiovascular  disease, and diabetes.  We also discussed how sleep disruption can increase risks for accident, such as while driving.  Weight loss as a means of improving sleep apnea was also reviewed.  Additional treatment options discussed were CPAP therapy, oral appliance, and surgical intervention.  Assessment/plan:  Obstructive sleep apnea. Plan: - will arrange for in lab sleep study to further assess   Chesley Mires, M.D. Pager [phone]

## 2015-08-03 ENCOUNTER — Ambulatory Visit (INDEPENDENT_AMBULATORY_CARE_PROVIDER_SITE_OTHER): Payer: Medicaid Other | Admitting: Neurology

## 2015-08-03 ENCOUNTER — Telehealth: Payer: Self-pay | Admitting: Neurology

## 2015-08-03 ENCOUNTER — Encounter: Payer: Self-pay | Admitting: Neurology

## 2015-08-03 VITALS — BP 121/76 | HR 77 | Ht 73.0 in | Wt 227.0 lb

## 2015-08-03 DIAGNOSIS — R400 Somnolence: Secondary | ICD-10-CM | POA: Diagnosis not present

## 2015-08-03 DIAGNOSIS — N189 Chronic kidney disease, unspecified: Secondary | ICD-10-CM

## 2015-08-03 DIAGNOSIS — R5382 Chronic fatigue, unspecified: Secondary | ICD-10-CM

## 2015-08-03 NOTE — Progress Notes (Signed)
Chief Complaint  Patient presents with  . Excessive Daytime Sleepiness    His symptoms have not changed.  He has not returned the call to schedule his MRI appt but would like to do that today.      PATIENT: Johnny [name] DOB: [birth year]  Chief Complaint  Patient presents with  . Excessive Daytime Sleepiness    His symptoms have not changed.  He has not returned the call to schedule his MRI appt but would like to do that today.     HISTORICAL  Johnny [name] is a 54 years old right-handed male, seen in refer by  Elizabeth Palau, MD for evaluation of excessive daytime sleepiness, fatigue  He had a past medical history of asthma, previous smoker, now with COPD, chronic renal disease, status post left AV fistula placement but never required dialysis, also had a history of hepatitis C, hypertension.  He is now working part-time for [employer], part-time for school for [employer]  since 2014, he noticed difficulty focusing, staying awake to complete his schoolwork, about 4 years ago, in 2012, he had similar excessive sleepiness, fatigue, fell over the table while playing chess, it was due to clonidine, his symptoms has much improved after clonidine was stopped,   But since he started to school, he continued to struggle with excessive daytime sleepiness, focusing on his schoolwork, he also has frequent nocturia, he woke up average 3-5 times each night.  He complains of right ankle pain, mild gait difficulty, he also complains of neck pain  UPDATE Sep 28th 2016: He continue complains of excessive fatigue, sleepiness, difficulty focusing, difficult complete his schoolwork, he has missed his MRI appointment, we were not able to get his laboratory result  He has been trying to drink excessive amount of coffee without help,  REVIEW OF SYSTEMS: Full 14 system review of systems performed and notable only for shortness of breath, swollen abdomen, joint pain, snoring, muscle cramps,  bruise easily, agitation decreased concentration anxiety  ALLERGIES: Allergies  Allergen Reactions  . Heparin   . Naproxen     HOME MEDICATIONS: Current Outpatient Prescriptions  Medication Sig Dispense Refill  . albuterol (PROVENTIL HFA;VENTOLIN HFA) 108 (90 BASE) MCG/ACT inhaler Inhale into the lungs every 6 (six) hours as needed for wheezing or shortness of breath.    . ALLOPURINOL PO Take by mouth.    . colchicine 0.6 MG tablet Take 1 tablet (0.6 mg total) by mouth 2 (two) times daily. 30 tablet 1  . diclofenac sodium (VOLTAREN) 1 % GEL Apply 2 g topically 4 (four) times daily. Rub into affected area of foot 2 to 4 times daily 100 g 2  . furosemide (LASIX) 40 MG tablet Take 80 mg by mouth daily.     [name] Kitchen labetalol (NORMODYNE) 200 MG tablet Take 600 mg by mouth 2 (two) times daily.    . traMADol (ULTRAM) 50 MG tablet Take 1 tablet (50 mg total) by mouth every 6 (six) hours as needed. 15 tablet 0  . triamterene-hydrochlorothiazide (DYAZIDE) 37.5-25 MG per capsule Take 1 capsule by mouth 2 (two) times daily.     No current facility-administered medications for this visit.    PAST MEDICAL HISTORY: Past Medical History  Diagnosis Date  . Hypertension   . Chronic kidney disease   . COPD (chronic obstructive pulmonary disease)   . Hepatitis C     PAST SURGICAL HISTORY: Past Surgical History  Procedure Laterality Date  . Av fistula placement  09/12/2009  Left arm AVF    FAMILY HISTORY: Family History  Problem Relation Age of Onset  . Hypertension Mother   . Diabetes Mother   . Colon cancer Neg Hx     SOCIAL HISTORY:  Social History   Social History  . Marital Status: Legally Separated    Spouse Name: N/A  . Number of Children: N/A  . Years of Education: 14   Occupational History  . College Student    Social History Main Topics  . Smoking status: Former Smoker -- 0.10 packs/day    Types: Cigarettes    Quit date: 04/02/2011  . Smokeless tobacco: Never Used      Comment: trying to quit  . Alcohol Use: No  . Drug Use: No  . Sexual Activity: Not on file   Other Topics Concern  . Not on file   Social History Narrative   Lives at home alone.   Right-handed.    Occasional caffeine use.     PHYSICAL EXAM   Filed Vitals:   08/03/15 0935  BP: 121/76  Pulse: 77  Height: 6\' 1"  (1.854 m)  Weight: 227 lb (102.967 kg)    Not recorded      Body mass index is 29.96 kg/(m^2).  PHYSICAL EXAMNIATION:  Gen: NAD, conversant, well nourised, obese, well groomed                     Cardiovascular: Regular rate rhythm, no peripheral edema, warm, nontender. Eyes: Conjunctivae clear without exudates or hemorrhage Neck: Supple, no carotid bruise. Pulmonary: Clear to auscultation bilaterally   NEUROLOGICAL EXAM:  MENTAL STATUS: Speech:    Speech is normal; fluent and spontaneous with normal comprehension.  Cognition:     Orientation to time, place and person     Normal recent and remote memory     Normal Attention span and concentration     Normal Language, naming, repeating,spontaneous speech     Fund of knowledge   CRANIAL NERVES: CN II: Visual fields are full to confrontation. Fundoscopic exam is normal with sharp discs and no vascular changes. Pupils are round equal and briskly reactive to light. CN III, IV, VI: extraocular movement are normal. No ptosis. CN V: Facial sensation is intact to pinprick in all 3 divisions bilaterally. Corneal responses are intact.  CN VII: Face is symmetric with normal eye closure and smile. CN VIII: Hearing is normal to rubbing fingers CN IX, X: Palate elevates symmetrically. Phonation is normal. CN XI: Head turning and shoulder shrug are intact CN XII: Tongue is midline with normal movements and no atrophy.  MOTOR: There is no pronator drift of out-stretched arms. Muscle bulk and tone are normal. Muscle strength is normal.  REFLEXES: Reflexes are 2+ and symmetric at the biceps, triceps, knees, and  ankles. Plantar responses are flexor.  SENSORY: Intact to light touch, pinprick, position sense, and vibration sense are intact in fingers and toes.  COORDINATION: Rapid alternating movements and fine finger movements are intact. There is no dysmetria on finger-to-nose and heel-knee-shin.    GAIT/STANCE: Posture is normal. Gait is steady with normal steps, base, arm swing, and turning. Heel and toe walking are normal. Mild difficulty with tandem walking Romberg is absent.   DIAGNOSTIC DATA (LABS, IMAGING, TESTING) - I reviewed patient records, labs, notes, testing and imaging myself where available.   ASSESSMENT AND PLAN  Johnny [name] is a 54 y.o. male   Excessive daytime sleepiness, fatigue   Multifactorial, chronic renal disease,  poor sleep quality, frequent nocturia at nighttime, medicine side effect.  He has multiple vascular risk factors, COPD, chronic renal disease, previous smoker  MRI of the brain   Laboratory record from his primary care physician    Marcial Pacas, M.D. Ph.D.  [facility] [address] [place], [place] [address] Ph: [phone] Fax: [phone]  CC: Georgiann Mohs

## 2015-08-03 NOTE — Telephone Encounter (Signed)
Martha/Dr. Kennon Holter [phone] called to advise she faxed over 1 page of lab results performed at [facility] in March

## 2015-08-03 NOTE — Telephone Encounter (Signed)
Labs received and provided to Dr. Krista Blue for review.

## 2015-08-12 ENCOUNTER — Encounter: Payer: Self-pay | Admitting: Gastroenterology

## 2015-08-16 ENCOUNTER — Ambulatory Visit: Payer: Self-pay | Admitting: Gastroenterology

## 2015-08-31 ENCOUNTER — Telehealth: Payer: Self-pay | Admitting: Neurology

## 2015-08-31 NOTE — Telephone Encounter (Signed)
Pt called sts Dr Krista Blue requested he have labs(he sts she told him last OV 08/03/15). [facility] shows no orders for labs. Please call and advise at [phone].

## 2015-09-01 NOTE — Telephone Encounter (Signed)
Labs from PCP requested at last office visit - no new orders were placed - he is aware we have the labs to review with him at this appt.

## 2015-09-05 ENCOUNTER — Encounter: Payer: Self-pay | Admitting: Neurology

## 2015-09-05 ENCOUNTER — Ambulatory Visit (INDEPENDENT_AMBULATORY_CARE_PROVIDER_SITE_OTHER): Payer: Medicaid Other | Admitting: Neurology

## 2015-09-05 VITALS — BP 126/82 | HR 78 | Ht 73.0 in | Wt 229.5 lb

## 2015-09-05 DIAGNOSIS — G4719 Other hypersomnia: Secondary | ICD-10-CM | POA: Diagnosis not present

## 2015-09-05 DIAGNOSIS — N186 End stage renal disease: Secondary | ICD-10-CM | POA: Diagnosis not present

## 2015-09-05 NOTE — Progress Notes (Signed)
Chief Complaint  Patient presents with  . Headache    He has continued to have frequent headaches and excessive daytime somnolence.  He never called to schedule his MRI.  He has a pending sleep study in December.   Chief Complaint  Patient presents with  . Headache    He has continued to have frequent headaches and excessive daytime somnolence.  He never called to schedule his MRI.  He has a pending sleep study in December.      PATIENT: Johnny [name] DOB: December 13, 1960  Chief Complaint  Patient presents with  . Headache    He has continued to have frequent headaches and excessive daytime somnolence.  He never called to schedule his MRI.  He has a pending sleep study in December.     HISTORICAL  Johnny [name] is a 54 years old right-handed male, seen in refer by  Elizabeth Palau, MD for evaluation of excessive daytime sleepiness, fatigue  He had a past medical history of asthma, previous smoker, now with COPD, chronic renal disease, status post left AV fistula placement but never required dialysis, also had a history of hepatitis C, hypertension.  He is now working part-time for [employer], part-time for school for [employer]  since 2014, he noticed difficulty focusing, staying awake to complete his schoolwork, about 4 years ago, in 2012, he had similar excessive sleepiness, fatigue, fell over the table while playing chess, it was due to clonidine, his symptoms has much improved after clonidine was stopped,   But since he started to school, he continued to struggle with excessive daytime sleepiness, focusing on his schoolwork, he also has frequent nocturia, he woke up average 3-5 times each night.  He complains of right ankle pain, mild gait difficulty, he also complains of neck pain  UPDATE Sep 28th 2016: He continue complains of excessive fatigue, sleepiness, difficulty focusing, difficult complete his schoolwork, he has missed his MRI appointment, we were not able to  get his laboratory result  He has been trying to drink excessive amount of coffee without help  UPDATE Sep 05 2015: He still feels sleepy, difficulty to have his work done, especially in the early afternoon, difficulty focusing.  Sleep study is pending. MRI brain will be scheduled if he initiated the call.  I reviewed laboratory in March 2016, CMP showed elevated creatinine 2.64, BUN 48, mild elevated AST 52, ALT 56, normal CBC, with a low MCV of 74, RDW was elevated 15.9.  He goes to bed at 10pm, got up around 4am, he catch school bus by 630am, he also has frequent nocturia, wake up 3-4 times each night, he felt he slept okay at nighttime, he stayed in school until 2:30pm, got home around 3:30pm  REVIEW OF SYSTEMS: Full 14 system review of systems performed and notable only for fatigue, loss of vision, shortness of breath, swollen abdomen, food allergy, swallowing lymph nodes  ALLERGIES: Allergies  Allergen Reactions  . Heparin   . Naproxen     HOME MEDICATIONS: Current Outpatient Prescriptions  Medication Sig Dispense Refill  . albuterol (PROVENTIL HFA;VENTOLIN HFA) 108 (90 BASE) MCG/ACT inhaler Inhale into the lungs every 6 (six) hours as needed for wheezing or shortness of breath.    . ALLOPURINOL PO Take by mouth.    . colchicine 0.6 MG tablet Take 1 tablet (0.6 mg total) by mouth 2 (two) times daily. 30 tablet 1  . diclofenac sodium (VOLTAREN) 1 % GEL Apply 2 g topically 4 (  four) times daily. Rub into affected area of foot 2 to 4 times daily 100 g 2  . furosemide (LASIX) 40 MG tablet Take 80 mg by mouth daily.     [name] Kitchen labetalol (NORMODYNE) 200 MG tablet Take 600 mg by mouth 2 (two) times daily.    . traMADol (ULTRAM) 50 MG tablet Take 1 tablet (50 mg total) by mouth every 6 (six) hours as needed. 15 tablet 0  . triamterene-hydrochlorothiazide (DYAZIDE) 37.5-25 MG per capsule Take 1 capsule by mouth 2 (two) times daily.     No current facility-administered medications for this  visit.    PAST MEDICAL HISTORY: Past Medical History  Diagnosis Date  . Hypertension   . Chronic kidney disease   . COPD (chronic obstructive pulmonary disease) ([place])   . Hepatitis C     PAST SURGICAL HISTORY: Past Surgical History  Procedure Laterality Date  . Av fistula placement  09/12/2009    Left arm AVF    FAMILY HISTORY: Family History  Problem Relation Age of Onset  . Hypertension Mother   . Diabetes Mother   . Colon cancer Neg Hx     SOCIAL HISTORY:  Social History   Social History  . Marital Status: Legally Separated    Spouse Name: N/A  . Number of Children: N/A  . Years of Education: 14   Occupational History  . College Student    Social History Main Topics  . Smoking status: Former Smoker -- 0.10 packs/day    Types: Cigarettes    Quit date: 04/02/2011  . Smokeless tobacco: Never Used     Comment: trying to quit  . Alcohol Use: No  . Drug Use: No  . Sexual Activity: Not on file   Other Topics Concern  . Not on file   Social History Narrative   Lives at home alone.   Right-handed.    Occasional caffeine use.     PHYSICAL EXAM   Filed Vitals:   09/05/15 0917  BP: 126/82  Pulse: 78  Height: 6\' 1"  (1.854 m)  Weight: 229 lb 8 oz (104.101 kg)    Not recorded      Body mass index is 30.29 kg/(m^2).  PHYSICAL EXAMNIATION:  Gen: NAD, conversant, well nourised, obese, well groomed                     Cardiovascular: Regular rate rhythm, no peripheral edema, warm, nontender. Eyes: Conjunctivae clear without exudates or hemorrhage Neck: Supple, no carotid bruise. Pulmonary: Clear to auscultation bilaterally   NEUROLOGICAL EXAM:  MENTAL STATUS: Speech:    Speech is normal; fluent and spontaneous with normal comprehension.  Cognition:     Orientation to time, place and person     Normal recent and remote memory     Normal Attention span and concentration     Normal Language, naming, repeating,spontaneous speech     Fund of  knowledge   CRANIAL NERVES: CN II: Visual fields are full to confrontation. Fundoscopic exam is normal with sharp discs and no vascular changes. Pupils are round equal and briskly reactive to light. CN III, IV, VI: extraocular movement are normal. No ptosis. CN V: Facial sensation is intact to pinprick in all 3 divisions bilaterally. Corneal responses are intact.  CN VII: Face is symmetric with normal eye closure and smile. CN VIII: Hearing is normal to rubbing fingers CN IX, X: Palate elevates symmetrically. Phonation is normal. CN XI: Head turning and shoulder shrug are intact  CN XII: Tongue is midline with normal movements and no atrophy.  MOTOR: There is no pronator drift of out-stretched arms. Muscle bulk and tone are normal. Muscle strength is normal.  REFLEXES: Reflexes are 2+ and symmetric at the biceps, triceps, knees, and ankles. Plantar responses are flexor.  SENSORY: Intact to light touch, pinprick, position sense, and vibration sense are intact in fingers and toes.  COORDINATION: Rapid alternating movements and fine finger movements are intact. There is no dysmetria on finger-to-nose and heel-knee-shin.    GAIT/STANCE: Posture is normal. Gait is steady with normal steps, base, arm swing, and turning. Heel and toe walking are normal. Mild difficulty with tandem walking Romberg is absent.   DIAGNOSTIC DATA (LABS, IMAGING, TESTING) - I reviewed patient records, labs, notes, testing and imaging myself where available.   ASSESSMENT AND PLAN  DEMITRIOS DITTOE is a 54 y.o. male   Excessive daytime sleepiness, fatigue   Multifactorial, chronic renal disease, poor sleep quality, frequent nocturia at nighttime, medicine side effect.  He has multiple vascular risk factors, COPD, chronic renal disease, previous smoker  MRI of the brain   Complete study.  I also advised him to arrange his schedule to have longer at least 8-9 hours nighttime sleep     Marcial Pacas, M.D.  Ph.D.  [facility] [address], [place], [place] [address] Ph: [phone] Fax: [phone]  CC: Georgiann Mohs

## 2015-09-28 ENCOUNTER — Telehealth: Payer: Self-pay | Admitting: Lab

## 2015-09-28 NOTE — Telephone Encounter (Signed)
Hep C referral-Tried calling pt-MB full- Lm for PCP contact Joann/Ebony to give update and to see if they had an alternative number on file-someone from their  called about referral on 09/27/15

## 2015-10-11 NOTE — Telephone Encounter (Signed)
Joann from PCP's office called regarding referral-gave her update.  I was able to reach pt later the same day.  He is scheduled for labs per his request for 10/25/15 @ 1130. ( He is traveling). LM for Joann with update

## 2015-10-12 ENCOUNTER — Ambulatory Visit: Payer: Self-pay | Admitting: Gastroenterology

## 2015-10-15 ENCOUNTER — Ambulatory Visit
Admission: RE | Admit: 2015-10-15 | Discharge: 2015-10-15 | Disposition: A | Discharge status: Home / Self Care | Payer: Medicaid Other | Source: Ambulatory Visit | Attending: Neurology | Admitting: Neurology

## 2015-10-15 DIAGNOSIS — R5382 Chronic fatigue, unspecified: Secondary | ICD-10-CM | POA: Diagnosis not present

## 2015-10-15 DIAGNOSIS — R400 Somnolence: Secondary | ICD-10-CM

## 2015-10-15 DIAGNOSIS — N189 Chronic kidney disease, unspecified: Secondary | ICD-10-CM

## 2015-10-18 ENCOUNTER — Telehealth: Payer: Self-pay | Admitting: *Deleted

## 2015-10-18 ENCOUNTER — Encounter: Payer: Self-pay | Admitting: *Deleted

## 2015-10-18 NOTE — Telephone Encounter (Signed)
-----   Message from Marcial Pacas, MD sent at 10/17/2015 10:13 AM EST ----- Please call pt for normal MRI brain, evidence of chronic sinusitis.

## 2015-10-18 NOTE — Telephone Encounter (Signed)
I spoke to pt and relayed the copy of MRI results to him.  (normal with evidence of chronic issues with sinuses).  Sent copy to pt, as well as my chart activation.

## 2015-10-21 ENCOUNTER — Encounter: Payer: Self-pay | Admitting: Gastroenterology

## 2015-10-21 ENCOUNTER — Ambulatory Visit: Payer: Self-pay | Admitting: Gastroenterology

## 2015-10-24 ENCOUNTER — Ambulatory Visit (HOSPITAL_BASED_OUTPATIENT_CLINIC_OR_DEPARTMENT_OTHER): Payer: Medicaid Other | Attending: Pulmonary Disease | Admitting: Radiology

## 2015-10-24 DIAGNOSIS — J449 Chronic obstructive pulmonary disease, unspecified: Secondary | ICD-10-CM | POA: Insufficient documentation

## 2015-10-24 DIAGNOSIS — I10 Essential (primary) hypertension: Secondary | ICD-10-CM | POA: Diagnosis not present

## 2015-10-24 DIAGNOSIS — R0683 Snoring: Secondary | ICD-10-CM | POA: Diagnosis not present

## 2015-10-24 DIAGNOSIS — R5383 Other fatigue: Secondary | ICD-10-CM | POA: Insufficient documentation

## 2015-10-24 DIAGNOSIS — G4733 Obstructive sleep apnea (adult) (pediatric): Secondary | ICD-10-CM | POA: Insufficient documentation

## 2015-10-25 ENCOUNTER — Other Ambulatory Visit: Payer: Medicaid Other

## 2015-10-25 ENCOUNTER — Other Ambulatory Visit: Payer: Self-pay

## 2015-10-25 DIAGNOSIS — B182 Chronic viral hepatitis C: Secondary | ICD-10-CM

## 2015-10-25 LAB — IRON: IRON: 200 ug/dL — AB (ref 50–180)

## 2015-10-25 NOTE — Telephone Encounter (Signed)
Pt came in today and had required labs drawn.  He was given an appmt to see Dr. Linus Salmons on 11/09/2015 @ 905-PCP's office informed-Joanne

## 2015-10-26 LAB — PROTIME-INR
INR: 1.03 (ref <1.50)
Prothrombin Time: 13.6 seconds (ref 11.6–15.2)

## 2015-10-26 LAB — HEPATITIS B SURFACE ANTIBODY,QUALITATIVE: Hep B S Ab: NEGATIVE

## 2015-10-26 LAB — ANA: Anti Nuclear Antibody(ANA): NEGATIVE

## 2015-10-26 LAB — HEPATITIS B SURFACE ANTIGEN: HEP B S AG: NEGATIVE

## 2015-10-26 LAB — HIV ANTIBODY (ROUTINE TESTING W REFLEX): HIV 1&2 Ab, 4th Generation: NONREACTIVE

## 2015-10-26 LAB — HEPATITIS A ANTIBODY, TOTAL: HEP A TOTAL AB: BORDERLINE — AB

## 2015-10-26 LAB — HEPATITIS B CORE ANTIBODY, TOTAL: HEP B C TOTAL AB: REACTIVE — AB

## 2015-11-01 LAB — HCV RNA, QUANT REAL-TIME PCR W/REFLEX
HCV RNA, PCR, QN (Log): 6.73 LogIU/mL — ABNORMAL HIGH
HCV RNA, PCR, QN: 5400000 [IU]/mL — AB

## 2015-11-01 LAB — HCV RNA,LIPA RFLX NS5A DRUG RESIST

## 2015-11-03 ENCOUNTER — Telehealth: Payer: Self-pay | Admitting: Pulmonary Disease

## 2015-11-03 ENCOUNTER — Encounter: Payer: Self-pay | Admitting: Pulmonary Disease

## 2015-11-03 DIAGNOSIS — G4733 Obstructive sleep apnea (adult) (pediatric): Secondary | ICD-10-CM | POA: Diagnosis not present

## 2015-11-03 HISTORY — DX: Obstructive sleep apnea (adult) (pediatric): G47.33

## 2015-11-03 LAB — HCV RNA NS5A DRUG RESISTANCE

## 2015-11-03 NOTE — Progress Notes (Signed)
Patient Name: Johnny [name], Johnny [name] Date: 10/24/2015 Gender: Male D.O.B: 1961/03/14 Age (years): 59 Referring Provider: Chesley Mires MD, ABSM Height (inches): 34 Interpreting Physician: Chesley Mires MD, ABSM Weight (lbs): 227 RPSGT: Carolin Coy BMI: 30 MRN: [id number] Neck Size: 15.00  CLINICAL INFORMATION Sleep Study Type: NPSG Indication for sleep study: COPD, Fatigue, Hypertension, OSA, Snoring Epworth Sleepiness Score: 19  SLEEP STUDY TECHNIQUE As per the AASM Manual for the Scoring of Sleep and Associated Events v2.3 (April 2016) with a hypopnea requiring 4% desaturations. The channels recorded and monitored were frontal, central and occipital EEG, electrooculogram (EOG), submentalis EMG (chin), nasal and oral airflow, thoracic and abdominal wall motion, anterior tibialis EMG, snore microphone, electrocardiogram, and pulse oximetry.  MEDICATIONS Patient's medications include: reviewed in electronic medical record. Medications self-administered by patient during sleep study : No sleep medicine administered.  SLEEP ARCHITECTURE The study was initiated at 10:32:33 PM and ended at 5:06:54 AM. Sleep onset time was 15.4 minutes and the sleep efficiency was 82.9%. The total sleep time was 327.0 minutes. Stage REM latency was 73.0 minutes. The patient spent 18.04% of the night in stage N1 sleep, 68.20% in stage N2 sleep, 0.00% in stage N3 and 13.76% in REM. Alpha intrusion was absent. Supine sleep was 50.42%.  RESPIRATORY PARAMETERS The overall apnea/hypopnea index (AHI) was 7.0 per hour. There were 2 total apneas, including 1 obstructive, 0 central and 1 mixed apneas. There were 36 hypopneas and 30 RERAs. The AHI during Stage REM sleep was 28.0 per hour. AHI while supine was 10.9 per hour. The mean oxygen saturation was 95.97%. The minimum SpO2 during sleep was 87.00%. Moderate snoring was noted during this study.  CARDIAC DATA The 2 lead EKG demonstrated sinus rhythm. The mean  heart rate was 60.43 beats per minute. Other EKG findings include: None.  LEG MOVEMENT DATA The total PLMS were 61 with a resulting PLMS index of 11.19. Associated arousal with leg movement index was 2.9 .  IMPRESSIONS This study showed mild obstructive sleep apnea with an AHI of 7 and SaO2 low of 87%.  DIAGNOSIS - Obstructive Sleep Apnea (327.23 [G47.33 ICD-10])  RECOMMENDATIONS Additional therapies include weight loss, CPAP, oral appliance, or surgical assessment.   Chesley Mires, MD, [place], American Board of Sleep Medicine 11/03/2015, 2:42 PM  NPI: [id number]

## 2015-11-03 NOTE — Telephone Encounter (Signed)
PSG 10/24/15 >> AHI 7, SpO2 low 87%.  Will have my nurse inform pt that sleep study shows mild sleep apnea.  Options are 1) CPAP now, 2) ROV first.  If He is agreeable to CPAP, then please send order for auto CPAP range 5 to 15 cm H2O with heated humidity and mask of choice.  Have download sent 1 month after starting CPAP and set up ROV 2 months after starting CPAP >> can be with me or Tammy Parrett.

## 2015-11-04 NOTE — Telephone Encounter (Signed)
lmtcb x1 for pt. 

## 2015-11-09 ENCOUNTER — Encounter: Payer: Self-pay | Admitting: Internal Medicine

## 2015-11-09 NOTE — Telephone Encounter (Signed)
PT had an  appmt with Dr Linus Salmons today at 905am-He No showed-I lm for pt

## 2015-11-09 NOTE — Telephone Encounter (Signed)
Called spoke with pt. Aware of results below. He prefers to come in and discuss the results. appt scheduled to see TP on 1/19 at 3:30

## 2015-11-24 ENCOUNTER — Ambulatory Visit: Payer: Self-pay | Admitting: Adult Health

## 2015-12-01 ENCOUNTER — Ambulatory Visit: Payer: Self-pay | Admitting: Adult Health

## 2015-12-09 ENCOUNTER — Ambulatory Visit (INDEPENDENT_AMBULATORY_CARE_PROVIDER_SITE_OTHER): Payer: Medicaid Other | Admitting: Adult Health

## 2015-12-09 ENCOUNTER — Encounter: Payer: Self-pay | Admitting: Adult Health

## 2015-12-09 VITALS — BP 118/80 | HR 68 | Ht 73.0 in | Wt 221.4 lb

## 2015-12-09 DIAGNOSIS — G4733 Obstructive sleep apnea (adult) (pediatric): Secondary | ICD-10-CM | POA: Diagnosis not present

## 2015-12-09 NOTE — Assessment & Plan Note (Signed)
Mild obstructive sleep apnea with significant daytime sleepiness Sleep study was reviewed in detail with patient. Patient treatment options including weight loss and C Pap were reviewed. Patient would like to think more about a C Pap machine. As discuss it with his significant other. If patient decides on C Pap will get a download in one month with a follow-up in 2 months Patient advised on weight loss. Healthy sleep regimen. He is also advised not to drive. His sleepy.

## 2015-12-09 NOTE — Patient Instructions (Signed)
We recommend CPAP At bedtime  .  If you decide to go forward with this call our office back Work on weight loss.  Do not drive if sleepy.  Follow up Dr. Halford Chessman  In 2 months after your start CPAP .

## 2015-12-09 NOTE — Progress Notes (Signed)
Subjective:    Patient ID: Johnny [name], male    DOB: 02-Mar-1961, 55 y.o.   MRN: [id number]  HPI 55 yo male seen for sleep consult 07/2015 for sleep issues , daytime sleepiness and witnessed apnea found to have mild OSA on sleep study 07/2015 .   TEST  PSG 10/24/15 >> AHI 7, SpO2 low 87%.  12/09/2015 Follow up : OSA Patient returns for a follow-up for sleep apnea. Patient was seen in September 2016 for sleep consult. Complaint of daytime sleepiness and witnessed nocturnal apneic events. Patient was set up for a sleep study. This was completed 10/24/2015. This showed mild sleep apnea with AHI at 7, SPO2 low at 87%. We reviewed his sleep study results. We went over sleep apnea. Treatment options including weight loss, oral appliance and C Pap. Reviewed patient education on sleep apnea. Patient wishes to think about C Pap machine further and discuss it with his significant other.   Past Medical History  Diagnosis Date  . Hypertension   . Chronic kidney disease   . COPD (chronic obstructive pulmonary disease) ([place])   . Hepatitis C   . OSA (obstructive sleep apnea) 11/03/2015   Current Outpatient Prescriptions on File Prior to Visit  Medication Sig Dispense Refill  . ALLOPURINOL PO Take by mouth 2 (two) times daily.     . colchicine 0.6 MG tablet Take 1 tablet (0.6 mg total) by mouth 2 (two) times daily. (Patient taking differently: Take 0.6 mg by mouth 2 (two) times daily as needed. ) 30 tablet 1  . labetalol (NORMODYNE) 200 MG tablet Take 600 mg by mouth 2 (two) times daily.    . traMADol (ULTRAM) 50 MG tablet Take 1 tablet (50 mg total) by mouth every 6 (six) hours as needed. 15 tablet 0  . triamterene-hydrochlorothiazide (DYAZIDE) 37.5-25 MG per capsule Take 1 capsule by mouth 2 (two) times daily.    [name] Kitchen albuterol (PROVENTIL HFA;VENTOLIN HFA) 108 (90 BASE) MCG/ACT inhaler Inhale into the lungs every 6 (six) hours as needed for wheezing or shortness of breath. Reported on 12/09/2015      No current facility-administered medications on file prior to visit.     Review of Systems Constitutional:   No  weight loss, night sweats,  Fevers, chills, fatigue, or  lassitude.  HEENT:   No headaches,  Difficulty swallowing,  Tooth/dental problems, or  Sore throat,                No sneezing, itching, ear ache, nasal congestion, post nasal drip,   CV:  No chest pain,  Orthopnea, PND, swelling in lower extremities, anasarca, dizziness, palpitations, syncope.   GI  No heartburn, indigestion, abdominal pain, nausea, vomiting, diarrhea, change in bowel habits, loss of appetite, bloody stools.   Resp: No shortness of breath with exertion or at rest.  No excess mucus, no productive cough,  No non-productive cough,  No coughing up of blood.  No change in color of mucus.  No wheezing.  No chest wall deformity  Skin: no rash or lesions.  GU: no dysuria, change in color of urine, no urgency or frequency.  No flank pain, no hematuria   MS:  No joint pain or swelling.  No decreased range of motion.  No back pain.  Psych:  No change in mood or affect. No depression or anxiety.  No memory loss.         Objective:   Physical Exam Filed Vitals:   12/09/15 1211  BP: 118/80  Pulse: 68  Height: 6\' 1"  (1.854 m)  Weight: 221 lb 6.4 oz (100.426 kg)  SpO2: 98%   Body mass index is 29.22 kg/(m^2).  GEN: A/Ox3; pleasant , NAD, overweight  HEENT:  [place]/AT,  EACs-clear, TMs-wnl, NOSE-clear, THROAT-clear, no lesions, no postnasal drip or exudate noted. Class 2 MP airway with elongated uvual   NECK:  Supple w/ fair ROM; no JVD; normal carotid impulses w/o bruits; no thyromegaly or nodules palpated; no lymphadenopathy.  RESP  Clear  P & A; w/o, wheezes/ rales/ or rhonchi.no accessory muscle use, no dullness to percussion  CARD:  RRR, no m/r/g  , no peripheral edema, pulses intact, no cyanosis or clubbing.  GI:   Soft & nt; nml bowel sounds; no organomegaly or masses detected.  Musco: Warm  bil, no deformities or joint swelling noted.   Neuro: alert, no focal deficits noted.    Skin: Warm, no lesions or rashes         Assessment & Plan:

## 2015-12-12 NOTE — Progress Notes (Signed)
Reviewed and agree with assessment/plan. 

## 2015-12-26 ENCOUNTER — Encounter: Payer: Self-pay | Admitting: Gastroenterology

## 2015-12-28 ENCOUNTER — Ambulatory Visit: Payer: Self-pay | Admitting: Gastroenterology

## 2016-01-03 ENCOUNTER — Ambulatory Visit: Payer: Medicaid Other | Admitting: Neurology

## 2016-01-03 ENCOUNTER — Telehealth: Payer: Self-pay | Admitting: *Deleted

## 2016-01-03 NOTE — Telephone Encounter (Signed)
No showed follow up appointment. 

## 2016-01-04 ENCOUNTER — Encounter: Payer: Self-pay | Admitting: Neurology

## 2016-01-26 ENCOUNTER — Emergency Department (HOSPITAL_COMMUNITY)
Admission: EM | Admit: 2016-01-26 | Discharge: 2016-01-26 | Disposition: A | Discharge status: Home / Self Care | Payer: Medicaid Other | Attending: Emergency Medicine | Admitting: Emergency Medicine

## 2016-01-26 ENCOUNTER — Encounter (HOSPITAL_COMMUNITY): Payer: Self-pay | Admitting: Emergency Medicine

## 2016-01-26 DIAGNOSIS — Z992 Dependence on renal dialysis: Secondary | ICD-10-CM | POA: Diagnosis not present

## 2016-01-26 DIAGNOSIS — Z8619 Personal history of other infectious and parasitic diseases: Secondary | ICD-10-CM | POA: Diagnosis not present

## 2016-01-26 DIAGNOSIS — N186 End stage renal disease: Secondary | ICD-10-CM | POA: Diagnosis not present

## 2016-01-26 DIAGNOSIS — Z8669 Personal history of other diseases of the nervous system and sense organs: Secondary | ICD-10-CM | POA: Diagnosis not present

## 2016-01-26 DIAGNOSIS — Z760 Encounter for issue of repeat prescription: Secondary | ICD-10-CM | POA: Diagnosis present

## 2016-01-26 DIAGNOSIS — I159 Secondary hypertension, unspecified: Secondary | ICD-10-CM

## 2016-01-26 DIAGNOSIS — J449 Chronic obstructive pulmonary disease, unspecified: Secondary | ICD-10-CM | POA: Insufficient documentation

## 2016-01-26 DIAGNOSIS — Z79899 Other long term (current) drug therapy: Secondary | ICD-10-CM | POA: Diagnosis not present

## 2016-01-26 DIAGNOSIS — Z87891 Personal history of nicotine dependence: Secondary | ICD-10-CM | POA: Insufficient documentation

## 2016-01-26 MED ORDER — LABETALOL HCL 200 MG PO TABS: 600.0000 mg | ORAL_TABLET | Freq: Two times a day (BID) | ORAL | Status: DC

## 2016-01-26 MED ORDER — LABETALOL HCL 200 MG PO TABS
600.0000 mg | ORAL_TABLET | Freq: Once | ORAL | Status: AC
  Administered 2016-01-26: 600 mg via ORAL
  Filled 2016-01-26: qty 3

## 2016-01-26 MED ORDER — TRIAMTERENE-HCTZ 37.5-25 MG PO CAPS: 1.0000 | ORAL_CAPSULE | Freq: Two times a day (BID) | ORAL | Status: DC

## 2016-01-26 MED ORDER — TRIAMTERENE-HCTZ 37.5-25 MG PO TABS
1.0000 | ORAL_TABLET | Freq: Once | ORAL | Status: AC
  Administered 2016-01-26: 1 via ORAL
  Filled 2016-01-26: qty 1

## 2016-01-26 MED ORDER — TRIAMTERENE-HCTZ 37.5-25 MG PO CAPS: 1.0000 | ORAL_CAPSULE | Freq: Once | ORAL | Status: DC

## 2016-01-26 NOTE — ED Provider Notes (Signed)
CSN: [id number]     Arrival date & time 01/26/16  [id number] History   First MD Initiated Contact with Patient 01/26/16 0630     Chief Complaint  Patient presents with  . Medication Refill     (Consider location/radiation/quality/duration/timing/severity/associated sxs/prior Treatment) Patient is a 55 y.o. male presenting with general illness. The history is provided by the patient and medical records.  Illness Location:  Headache, out of BP medications Quality:  N/a Severity:  Mild Onset quality:  Gradual Timing:  Constant Progression:  Worsening Chronicity:  New Associated symptoms: headaches   Associated symptoms: no abdominal pain, no chest pain, no fever, no rhinorrhea, no shortness of breath, no sore throat and no vomiting     Past Medical History  Diagnosis Date  . Hypertension   . Chronic kidney disease   . COPD (chronic obstructive pulmonary disease) ([place])   . Hepatitis C   . OSA (obstructive sleep apnea) 11/03/2015   Past Surgical History  Procedure Laterality Date  . Av fistula placement  09/12/2009    Left arm AVF   Family History  Problem Relation Age of Onset  . Hypertension Mother   . Diabetes Mother   . Colon cancer Neg Hx    Social History  Substance Use Topics  . Smoking status: Former Smoker -- 0.10 packs/day    Types: Cigarettes    Quit date: 04/02/2011  . Smokeless tobacco: Never Used     Comment: trying to quit  . Alcohol Use: No    Review of Systems  Constitutional: Negative for fever.  HENT: Negative for rhinorrhea and sore throat.   Eyes: Negative for pain.  Respiratory: Negative for choking and shortness of breath.   Cardiovascular: Negative for chest pain.  Gastrointestinal: Negative for vomiting and abdominal pain.  Endocrine: Negative for polydipsia and polyuria.  Neurological: Positive for headaches.  All other systems reviewed and are negative.     Allergies  Heparin and Naproxen  Home Medications   Prior to Admission  medications   Medication Sig Start Date End Date Taking? Authorizing Provider  albuterol (PROVENTIL HFA;VENTOLIN HFA) 108 (90 BASE) MCG/ACT inhaler Inhale into the lungs every 6 (six) hours as needed for wheezing or shortness of breath. Reported on 12/09/2015    Historical Provider, MD  ALLOPURINOL PO Take by mouth 2 (two) times daily.     Historical Provider, MD  colchicine 0.6 MG tablet Take 1 tablet (0.6 mg total) by mouth 2 (two) times daily. Patient taking differently: Take 0.6 mg by mouth 2 (two) times daily as needed.  07/02/15   Billy Fischer, MD  labetalol (NORMODYNE) 200 MG tablet Take 3 tablets (600 mg total) by mouth 2 (two) times daily. 01/26/16   Merrily Pew, MD  traMADol (ULTRAM) 50 MG tablet Take 1 tablet (50 mg total) by mouth every 6 (six) hours as needed. 04/04/15   Gregor Hams, MD  triamterene-hydrochlorothiazide (DYAZIDE) 37.5-25 MG capsule Take 1 each (1 capsule total) by mouth 2 (two) times daily. 01/26/16   Corene Cornea Koron Godeaux, MD   BP 162/102 mmHg  Pulse 72  Temp(Src) 97.5 F (36.4 C) (Oral)  Resp 16  Ht 6\' 2"  (1.88 m)  Wt 212 lb (96.163 kg)  BMI 27.21 kg/m2  SpO2 99% Physical Exam  Constitutional: He is oriented to person, place, and time. He appears well-developed and well-nourished.  HENT:  Head: Normocephalic and atraumatic.  Eyes: Pupils are equal, round, and reactive to light.  Neck: Normal range of motion.  Cardiovascular: Normal rate.   Pulmonary/Chest: Effort normal and breath sounds normal. No respiratory distress.  Abdominal: Soft. He exhibits no distension. There is no tenderness.  Musculoskeletal: Normal range of motion.  Neurological: He is alert and oriented to person, place, and time.  No altered mental status, able to give full seemingly accurate history.  Face is symmetric, EOM's intact, pupils equal and reactive, vision intact, tongue and uvula midline without deviation Upper and Lower extremity motor 5/5, intact pain perception in distal extremities,  2+ reflexes in biceps, patella and achilles tendons. Finger to nose normal, heel to shin normal. Walks without assistance or evident ataxia.   Skin: Skin is warm and dry. No rash noted. No erythema.  Nursing note and vitals reviewed.   ED Course  Procedures (including critical care time) Labs Review Labs Reviewed - No data to display  Imaging Review No results found. I have personally reviewed and evaluated these images and lab results as part of my medical decision-making.   EKG Interpretation None      MDM   Final diagnoses:  Secondary hypertension, unspecified  ESRD (end stage renal disease) ([place])    Headache 2/2 elevated BP, out of meds 2/2 PCP death. Exam as above. meds refilled. Will follow up with [facility] to continue to establish care. i discussed holding colchicine until seen by provider 2/2 CKD and possible dose adjustments.   New Prescriptions: Discharge Medication List as of 01/26/2016  6:51 AM       I have personally and contemperaneously reviewed labs and imaging and used in my decision making as above.   A medical screening exam was performed and I feel the patient has had an appropriate workup for their chief complaint at this time and likelihood of emergent condition existing is low. Their vital signs are stable. They have been counseled on decision, discharge, follow up and which symptoms necessitate immediate return to the emergency department.  They verbally stated understanding and agreement with plan and discharged in stable condition.       Merrily Pew, MD 01/26/16 [phone]

## 2016-01-26 NOTE — ED Notes (Signed)
Patient left at this time with all belongings. 

## 2016-01-26 NOTE — ED Notes (Signed)
Pt arrives POV with concerns after the death of his PCP, unable to get refills for his labetalol (has been out for one day) and triamterene/hctz (hasn't had for three weeks). States he typically gets headaches with increased BP - reports headache at this time.

## 2016-02-02 ENCOUNTER — Ambulatory Visit (INDEPENDENT_AMBULATORY_CARE_PROVIDER_SITE_OTHER): Payer: Medicaid Other | Admitting: Neurology

## 2016-02-02 ENCOUNTER — Encounter: Payer: Self-pay | Admitting: Neurology

## 2016-02-02 VITALS — BP 129/82 | HR 79 | Temp 97.2°F | Resp 16 | Ht 73.0 in | Wt 210.0 lb

## 2016-02-02 DIAGNOSIS — G4733 Obstructive sleep apnea (adult) (pediatric): Secondary | ICD-10-CM

## 2016-02-02 DIAGNOSIS — G4710 Hypersomnia, unspecified: Secondary | ICD-10-CM | POA: Insufficient documentation

## 2016-02-02 DIAGNOSIS — N186 End stage renal disease: Secondary | ICD-10-CM | POA: Diagnosis not present

## 2016-02-02 MED ORDER — TRIAMTERENE-HCTZ 37.5-25 MG PO CAPS: ORAL_CAPSULE | ORAL | Status: DC

## 2016-02-02 MED ORDER — LABETALOL HCL 200 MG PO TABS: 600.0000 mg | ORAL_TABLET | Freq: Two times a day (BID) | ORAL | Status: DC

## 2016-02-02 NOTE — Progress Notes (Signed)
Chief Complaint  Patient presents with  . Fatigue    rm 4, "discuss MRI results; level of fatigue fluctuates"      PATIENT: Johnny [name] DOB: Mar 19, 1961  Chief Complaint  Patient presents with  . Fatigue    rm 4, "discuss MRI results; level of fatigue fluctuates"     HISTORICAL  Johnny [name] is a 55 years old right-handed male, seen in refer by  Elizabeth Palau, MD for evaluation of excessive daytime sleepiness, fatigue  He had a past medical history of asthma, previous smoker, now with COPD, chronic renal disease, status post left AV fistula placement but never required dialysis, also had a history of hepatitis C, hypertension.  He is now working part-time for [employer], part-time for school for [employer] Since 2014, he noticed difficulty focusing, staying awake to complete his schoolwork, about 4 years ago, in 2012, he had similar excessive sleepiness, fatigue, fell over the table while playing chess, it was due to clonidine, his symptoms has much improved after clonidine was stopped,   But since he started to school, he continued to struggle with excessive daytime sleepiness, focusing on his schoolwork, he also has frequent nocturia, he woke up average 3-5 times each night.  He complains of right ankle pain, mild gait difficulty, he also complains of neck pain  UPDATE Sep 28th 2016: He continue complains of excessive fatigue, sleepiness, difficulty focusing, difficult complete his schoolwork, he has missed his MRI appointment, we were not able to get his laboratory result  He has been trying to drink excessive amount of coffee without help  UPDATE Sep 05 2015: He still feels sleepy, difficulty to have his work done, especially in the early afternoon, difficulty focusing.  Sleep study is pending. MRI brain will be scheduled if he initiated the call.  I reviewed laboratory in March 2016, CMP showed elevated creatinine 2.64, BUN 48, mild elevated AST 52, ALT  56, normal CBC, with a low MCV of 74, RDW was elevated 15.9.  He goes to bed at 10pm, got up around 4am, he catch school bus by 630am, he also has frequent nocturia, wake up 3-4 times each night, he felt he slept okay at nighttime, he stayed in school until 2:30pm, got home around 3:30pm  UPDATE February 02 2016: I have reviewed his sleep study in Sep 2016, which showed evidence of mild OSA, he tried CPAP during sleep study "it seems to be uncomfortable". He is not using CPAP machine now. We have personally reviewed MRI of the brain that was normal.  He is taking 2 blood pressure medications labetalol 300 mg 2 tablets twice a day, Dyazide twice a day, which is a diuretic, he complains of frequent nocturia, woke up 3 4 times to urinate, he also woke up 5:00 in the morning, catch different buses to attend his class at 10:00,   REVIEW OF SYSTEMS: Full 14 system review of systems performed and notable only for frequent nocturia, frequent awakening, daytime sleepiness, fatigue   ALLERGIES: Allergies  Allergen Reactions  . Heparin     Pt reports he's not sure what type of reaction  . Naproxen     HOME MEDICATIONS: Current Outpatient Prescriptions  Medication Sig Dispense Refill  . albuterol (PROVENTIL HFA;VENTOLIN HFA) 108 (90 BASE) MCG/ACT inhaler Inhale into the lungs every 6 (six) hours as needed for wheezing or shortness of breath. Reported on 12/09/2015    . ALLOPURINOL PO Take by mouth 2 (two) times daily.     [name] Kitchen  colchicine 0.6 MG tablet Take 1 tablet (0.6 mg total) by mouth 2 (two) times daily. (Patient taking differently: Take 0.6 mg by mouth 2 (two) times daily as needed. ) 30 tablet 1  . labetalol (NORMODYNE) 200 MG tablet Take 3 tablets (600 mg total) by mouth 2 (two) times daily. 60 tablet 1  . traMADol (ULTRAM) 50 MG tablet Take 1 tablet (50 mg total) by mouth every 6 (six) hours as needed. 15 tablet 0  . triamterene-hydrochlorothiazide (DYAZIDE) 37.5-25 MG capsule Take 1 each (1  capsule total) by mouth 2 (two) times daily. 60 capsule 1   No current facility-administered medications for this visit.    PAST MEDICAL HISTORY: Past Medical History  Diagnosis Date  . Hypertension   . Chronic kidney disease   . COPD (chronic obstructive pulmonary disease) ([place])   . Hepatitis C   . OSA (obstructive sleep apnea) 11/03/2015    PAST SURGICAL HISTORY: Past Surgical History  Procedure Laterality Date  . Av fistula placement  09/12/2009    Left arm AVF    FAMILY HISTORY: Family History  Problem Relation Age of Onset  . Hypertension Mother   . Diabetes Mother   . Colon cancer Neg Hx     SOCIAL HISTORY:  Social History   Social History  . Marital Status: Legally Separated    Spouse Name: N/A  . Number of Children: N/A  . Years of Education: 14   Occupational History  . College Student    Social History Main Topics  . Smoking status: Former Smoker -- 0.10 packs/day    Types: Cigarettes    Quit date: 04/02/2011  . Smokeless tobacco: Never Used     Comment: trying to quit  . Alcohol Use: No  . Drug Use: No  . Sexual Activity: Not on file   Other Topics Concern  . Not on file   Social History Narrative   Lives at home alone.   Right-handed.    Occasional caffeine use.     PHYSICAL EXAM   Filed Vitals:   02/02/16 1016  BP: 129/82  Pulse: 79  Temp: 97.2 F (36.2 C)  Resp: 16  Height: 6\' 1"  (1.854 m)  Weight: 210 lb (95.255 kg)    Not recorded      Body mass index is 27.71 kg/(m^2).  PHYSICAL EXAMNIATION:  Gen: NAD, conversant, well nourised, obese, well groomed                     Cardiovascular: Regular rate rhythm, no peripheral edema, warm, nontender. Eyes: Conjunctivae clear without exudates or hemorrhage Neck: Supple, no carotid bruise. Pulmonary: Clear to auscultation bilaterally   NEUROLOGICAL EXAM:  MENTAL STATUS: Speech:    Speech is normal; fluent and spontaneous with normal comprehension.  Cognition:      Orientation to time, place and person     Normal recent and remote memory     Normal Attention span and concentration     Normal Language, naming, repeating,spontaneous speech     Fund of knowledge   CRANIAL NERVES: CN II: Visual fields are full to confrontation.Pupils are round equal and briskly reactive to light. CN III, IV, VI: extraocular movement are normal. No ptosis. CN V: Facial sensation is intact to pinprick in all 3 divisions bilaterally. Corneal responses are intact.  CN VII: Face is symmetric with normal eye closure and smile. CN VIII: Hearing is normal to rubbing fingers CN IX, X: Palate elevates symmetrically. Phonation is  normal. CN XI: Head turning and shoulder shrug are intact CN XII: Tongue is midline with normal movements and no atrophy.  MOTOR: There is no pronator drift of out-stretched arms. Muscle bulk and tone are normal. Muscle strength is normal.  REFLEXES: Reflexes are 2+ and symmetric at the biceps, triceps, knees, and ankles. Plantar responses are flexor.  SENSORY: Intact to light touch, pinprick, position sense, and vibration sense are intact in fingers and toes.  COORDINATION: Rapid alternating movements and fine finger movements are intact. There is no dysmetria on finger-to-nose and heel-knee-shin.    GAIT/STANCE: Posture is normal. Gait is steady with normal steps, base, arm swing, and turning. Heel and toe walking are normal. Mild difficulty with tandem walking Romberg is absent.   DIAGNOSTIC DATA (LABS, IMAGING, TESTING) - I reviewed patient records, labs, notes, testing and imaging myself where available.   ASSESSMENT AND PLAN  JADRIEN REYNAGA is a 55 y.o. male   Excessive daytime sleepiness, fatigue   Multifactorial, chronic renal disease, poor sleep quality, frequent nocturia at nighttime, medicine side effect.  Normal MRI of the brain  Sleep study showed mild obstructive sleep apnea  Laboratory evaluation TSH B12 today  I have  advised him to move his last dose of Dyazide to noon to decreased nocturia Mild obstructive sleep apnea Essential hypertension  I refilled his labetalol 300 mg 2 tablets twice a day, Dyazide twice a day  Marcial Pacas, M.D. Ph.D.  [facility] [address] [place], [place] [address] Ph: [phone] Fax: [phone]

## 2016-02-03 LAB — TSH: TSH: 1.44 u[IU]/mL (ref 0.450–4.500)

## 2016-02-03 LAB — VITAMIN B12: Vitamin B-12: 657 pg/mL (ref 211–946)

## 2016-02-05 ENCOUNTER — Encounter (HOSPITAL_COMMUNITY): Payer: Self-pay

## 2016-02-05 ENCOUNTER — Emergency Department (HOSPITAL_COMMUNITY)
Admission: EM | Admit: 2016-02-05 | Discharge: 2016-02-05 | Disposition: A | Discharge status: Home / Self Care | Payer: Medicaid Other | Attending: Emergency Medicine | Admitting: Emergency Medicine

## 2016-02-05 DIAGNOSIS — Z8619 Personal history of other infectious and parasitic diseases: Secondary | ICD-10-CM | POA: Insufficient documentation

## 2016-02-05 DIAGNOSIS — J449 Chronic obstructive pulmonary disease, unspecified: Secondary | ICD-10-CM | POA: Insufficient documentation

## 2016-02-05 DIAGNOSIS — Z87891 Personal history of nicotine dependence: Secondary | ICD-10-CM | POA: Insufficient documentation

## 2016-02-05 DIAGNOSIS — I129 Hypertensive chronic kidney disease with stage 1 through stage 4 chronic kidney disease, or unspecified chronic kidney disease: Secondary | ICD-10-CM | POA: Diagnosis not present

## 2016-02-05 DIAGNOSIS — Z8669 Personal history of other diseases of the nervous system and sense organs: Secondary | ICD-10-CM | POA: Insufficient documentation

## 2016-02-05 DIAGNOSIS — J020 Streptococcal pharyngitis: Secondary | ICD-10-CM

## 2016-02-05 DIAGNOSIS — Z79899 Other long term (current) drug therapy: Secondary | ICD-10-CM | POA: Diagnosis not present

## 2016-02-05 DIAGNOSIS — N189 Chronic kidney disease, unspecified: Secondary | ICD-10-CM | POA: Diagnosis not present

## 2016-02-05 DIAGNOSIS — Z760 Encounter for issue of repeat prescription: Secondary | ICD-10-CM | POA: Diagnosis present

## 2016-02-05 LAB — RAPID STREP SCREEN (MED CTR MEBANE ONLY): STREPTOCOCCUS, GROUP A SCREEN (DIRECT): POSITIVE — AB

## 2016-02-05 MED ORDER — DEXAMETHASONE SODIUM PHOSPHATE 10 MG/ML IJ SOLN
10.0000 mg | Freq: Once | INTRAMUSCULAR | Status: AC
  Administered 2016-02-05: 10 mg via INTRAMUSCULAR
  Filled 2016-02-05: qty 1

## 2016-02-05 MED ORDER — PENICILLIN G BENZATHINE 1200000 UNIT/2ML IM SUSP
1.2000 10*6.[IU] | Freq: Once | INTRAMUSCULAR | Status: AC
  Administered 2016-02-05: 1.2 10*6.[IU] via INTRAMUSCULAR
  Filled 2016-02-05: qty 2

## 2016-02-05 MED ORDER — LABETALOL HCL 200 MG PO TABS
600.0000 mg | ORAL_TABLET | Freq: Two times a day (BID) | ORAL | Status: DC
  Administered 2016-02-05: 600 mg via ORAL
  Filled 2016-02-05: qty 3

## 2016-02-05 NOTE — ED Notes (Signed)
C/O SORE THROAT AND LEFT EAR PAIN X 2-3 DAYS. ALSO, NEEDS RX FOR BP MEDS.

## 2016-02-05 NOTE — ED Notes (Signed)
Pt reports sore throat, onset 3 days ago. Pt also is requesting refill of Labetalol for his BP, last dose was yesterday. Denies vision changes.

## 2016-02-05 NOTE — Discharge Instructions (Signed)
Strep Throat Strep throat is a bacterial infection of the throat. Your health care provider may call the infection tonsillitis or pharyngitis, depending on whether there is swelling in the tonsils or at the back of the throat. Strep throat is most common during the cold months of the year in children who are 39-53 years of age, but it can happen during any season in people of any age. This infection is spread from person to person (contagious) through coughing, sneezing, or close contact. CAUSES Strep throat is caused by the bacteria called Streptococcus pyogenes. RISK FACTORS This condition is more likely to develop in:  People who spend time in crowded places where the infection can spread easily.  People who have close contact with someone who has strep throat. SYMPTOMS Symptoms of this condition include:  Fever or chills.   Redness, swelling, or pain in the tonsils or throat.  Pain or difficulty when swallowing.  White or yellow spots on the tonsils or throat.  Swollen, tender glands in the neck or under the jaw.  Red rash all over the body (rare). DIAGNOSIS This condition is diagnosed by performing a rapid strep test or by taking a swab of your throat (throat culture test). Results from a rapid strep test are usually ready in a few minutes, but throat culture test results are available after one or two days. TREATMENT This condition is treated with antibiotic medicine. HOME CARE INSTRUCTIONS Medicines  Take over-the-counter and prescription medicines only as told by your health care provider.  Take your antibiotic as told by your health care provider. Do not stop taking the antibiotic even if you start to feel better.  Have family members who also have a sore throat or fever tested for strep throat. They may need antibiotics if they have the strep infection. Eating and Drinking  Do not share food, drinking cups, or personal items that could cause the infection to spread to  other people.  If swallowing is difficult, try eating soft foods until your sore throat feels better.  Drink enough fluid to keep your urine clear or pale yellow. General Instructions  Gargle with a salt-water mixture 3-4 times per day or as needed. To make a salt-water mixture, completely dissolve -1 tsp of salt in 1 cup of warm water.  Make sure that all household members wash their hands well.  Get plenty of rest.  Stay home from school or work until you have been taking antibiotics for 24 hours.  Keep all follow-up visits as told by your health care provider. This is important. SEEK MEDICAL CARE IF:  The glands in your neck continue to get bigger.  You develop a rash, cough, or earache.  You cough up a thick liquid that is green, yellow-brown, or bloody.  You have pain or discomfort that does not get better with medicine.  Your problems seem to be getting worse rather than better.  You have a fever. SEEK IMMEDIATE MEDICAL CARE IF:  You have new symptoms, such as vomiting, severe headache, stiff or painful neck, chest pain, or shortness of breath.  You have severe throat pain, drooling, or changes in your voice.  You have swelling of the neck, or the skin on the neck becomes red and tender.  You have signs of dehydration, such as fatigue, dry mouth, and decreased urination.  You become increasingly sleepy, or you cannot wake up completely.  Your joints become red or painful.   This information is not intended to replace  advice given to you by your health care provider. Make sure you discuss any questions you have with your health care provider.   Follow-up with your primary care doctor if your symptoms do not improve. Retrieve your blood pressure medication prescriptions at [facility]. Return to the emergency department if you experience severe headache, blurry vision, weakness, difficulty breathing or swallowing, fever, chills.

## 2016-02-05 NOTE — ED Provider Notes (Signed)
CSN: [id number]     Arrival date & time 02/05/16  1012 History  By signing my name below, I, Johnny [name], attest that this documentation has been prepared under the direction and in the presence of Donnald Garre, PA-C Electronically Signed: Soijett [name], ED Scribe. 02/05/2016. 12:04 PM.  Chief Complaint  Patient presents with  . Medication Refill      The history is provided by the patient. No language interpreter was used.    HPI Comments: Johnny [name] is a 55 y.o. male with a medical hx of HTN, CKD, COPD, hepatitis C, who presents to the Emergency Department complaining of medication refill onset today. He notes that he has ran out of his HTN medications. Pt voices that he would like a dose of his labetalol today while in the ED.  He states that he has not tried any medications for the relief for his symptoms. He denies any other symptoms.   Pt also complains of sore throat x 3 days. Pt denies sick contacts at this time. Pt has associated symptoms of painful swallowing, appetite change due to sore throat, left ear pain, and mild cough. Pt has not tried any medications for the relief of his symptoms. Pt denies ear drainage, fever, chills, and any other symptoms.    Per pt chart review: Pt was seen in the ED on 01/26/16 for medication refill. Pt was Rx labetalol and maxzide-25 to last until 02/02/16. Pt was seen in his neurologist office on 02/02/16 and Dr. Marcial Pacas Rx labetalol and dyazide with refills of both medications for the pt. Per chart review, the pt has not picked up his prescriptions as of yet.   Past Medical History  Diagnosis Date  . Hypertension   . Chronic kidney disease   . COPD (chronic obstructive pulmonary disease) ([place])   . Hepatitis C   . OSA (obstructive sleep apnea) 11/03/2015   Past Surgical History  Procedure Laterality Date  . Av fistula placement  09/12/2009    Left arm AVF   Family History  Problem Relation Age of Onset  . Hypertension Mother   .  Diabetes Mother   . Colon cancer Neg Hx    Social History  Substance Use Topics  . Smoking status: Former Smoker -- 0.10 packs/day    Types: Cigarettes    Quit date: 04/02/2011  . Smokeless tobacco: Never Used     Comment: trying to quit  . Alcohol Use: No    Review of Systems  Constitutional: Positive for appetite change. Negative for fever and chills.  HENT: Positive for ear pain and sore throat. Negative for ear discharge and trouble swallowing.   Respiratory: Positive for cough.   All other systems reviewed and are negative.     Allergies  Heparin and Naproxen  Home Medications   Prior to Admission medications   Medication Sig Start Date End Date Taking? Authorizing Provider  albuterol (PROVENTIL HFA;VENTOLIN HFA) 108 (90 BASE) MCG/ACT inhaler Inhale into the lungs every 6 (six) hours as needed for wheezing or shortness of breath. Reported on 12/09/2015    Historical Provider, MD  ALLOPURINOL PO Take by mouth 2 (two) times daily.     Historical Provider, MD  colchicine 0.6 MG tablet Take 1 tablet (0.6 mg total) by mouth 2 (two) times daily. Patient taking differently: Take 0.6 mg by mouth 2 (two) times daily as needed.  07/02/15   Billy Fischer, MD  labetalol (NORMODYNE) 200 MG tablet Take 3 tablets (  600 mg total) by mouth 2 (two) times daily. 02/02/16   Marcial Pacas, MD  traMADol (ULTRAM) 50 MG tablet Take 1 tablet (50 mg total) by mouth every 6 (six) hours as needed. 04/04/15   Gregor Hams, MD  triamterene-hydrochlorothiazide (DYAZIDE) 37.5-25 MG capsule Take one tab in the morning, last dose at noon 02/02/16   Marcial Pacas, MD   BP 157/96 mmHg  Pulse 75  Temp(Src) 98.6 F (37 C) (Oral)  Resp 16  SpO2 94% Physical Exam  Constitutional: He is oriented to person, place, and time. He appears well-developed and well-nourished. No distress.  HENT:  Head: Normocephalic and atraumatic.  Mouth/Throat: Uvula is midline and mucous membranes are normal. No trismus in the jaw. Uvula  swelling present. Oropharyngeal exudate, posterior oropharyngeal edema and posterior oropharyngeal erythema present. No tonsillar abscesses.  Eyes: Conjunctivae are normal. Right eye exhibits no discharge. Left eye exhibits no discharge. No scleral icterus.  Neck: Neck supple.  Cardiovascular: Normal rate.   Pulmonary/Chest: Effort normal.  Lymphadenopathy:    He has cervical adenopathy.  Neurological: He is alert and oriented to person, place, and time. Coordination normal.  Skin: Skin is warm and dry. No rash noted. He is not diaphoretic. No erythema. No pallor.  Psychiatric: He has a normal mood and affect. His behavior is normal.  Nursing note and vitals reviewed.   ED Course  Procedures (including critical care time) DIAGNOSTIC STUDIES: Oxygen Saturation is 94% on RA, adequate by my interpretation.    COORDINATION OF CARE: 12:03 PM Discussed treatment plan with pt at bedside which includes rapid strep screen, penicillin injection, and decadron injection, and pt agreed to plan.    Labs Review Labs Reviewed  RAPID STREP SCREEN (NOT AT [facility]) - Abnormal; Notable for the following:    Streptococcus, Group A Screen (Direct) POSITIVE (*)    All other components within normal limits    Imaging Review No results found. I have personally reviewed and evaluated these lab results as part of my medical decision-making.   EKG Interpretation None      MDM   Final diagnoses:  Strep pharyngitis    Pt rapid strep test positive. Pt is tolerating secretions. Presentation not concerning for peritonsillar abscess or spread of infection to deep spaces of the throat; patent airway. Pt will be given penicillin injection and decadron injection, and dose of home BP medications.  Per chart review, the pt has 2 Rx waiting to be picked up at pharmacy, pt informed. No need for additional refill. Specific return precautions discussed. Recommended PCP follow up. Pt appears safe for discharge.    I  personally performed the services described in this documentation, which was scribed in my presence. The recorded information has been reviewed and is accurate.     Dondra Spry Axis, PA-C 02/05/16 [place], MD 02/05/16 1332

## 2016-03-02 ENCOUNTER — Ambulatory Visit: Payer: Self-pay | Admitting: Gastroenterology

## 2016-03-21 ENCOUNTER — Ambulatory Visit (INDEPENDENT_AMBULATORY_CARE_PROVIDER_SITE_OTHER): Payer: Medicaid Other | Admitting: Internal Medicine

## 2016-03-21 ENCOUNTER — Other Ambulatory Visit: Payer: Self-pay | Admitting: Internal Medicine

## 2016-03-21 VITALS — BP 159/94 | HR 66 | Temp 98.5°F | Wt 209.0 lb

## 2016-03-21 DIAGNOSIS — B182 Chronic viral hepatitis C: Secondary | ICD-10-CM

## 2016-03-21 MED ORDER — ELBASVIR-GRAZOPREVIR 50-100 MG PO TABS: 1.0000 | ORAL_TABLET | Freq: Every day | ORAL | Status: DC

## 2016-03-21 NOTE — Patient Instructions (Signed)
Date 03/21/2016  Dear Johnny [name], As discussed in the [facility], your hepatitis C therapy will include the following medications:          Zepatier (elbasvir 50 mg/grazoprevir 100 mg) for 12 weeks              OR      16 weeks with ribavirin in certain cases   Please note that ALL MEDICATIONS WILL START ON THE SAME DATE for a total of 12 weeks. ---------------------------------------------------------------- Your HCV Treatment Start Date: TBA   Your HCV genotype:  1a    Liver Fibrosis: TBD    ---------------------------------------------------------------- YOUR PHARMACY CONTACT:   [place] [facility] [place] Phone: [phone] Hours: Monday to Friday 7:30 am to 6:00 pm   Please always contact your pharmacy at least 3-4 business days before you run out of medications to ensure your next month's medication is ready or 1 week prior to running out if you receive it by mail.  Remember, each prescription is for 28 days. ---------------------------------------------------------------- GENERAL NOTES REGARDING YOUR HEPATITIS C MEDICATION:  ZEPATIER is available as a beige-colored, oval-shaped, film-coated tablet debossed with "770" on one side and plain on the other. Each tablet contains 50 mg elbasvir and 100 mg grazoprevir.  Common side effects of ZEPATIER when used without ribavirin include: - feeling tired -trouble sleeping - headache -diarrhea - nausea  Common side effects of ZEPATIER when used with ribavirin include: - low red blood cell counts (anemia) - feeling irritable - headache - stomach pain - feeling tired - depression - shortness of breath - joint pain - rash or itching  Please note that this only lists the most common side effects and is NOT a comprehensive list of the potential side effects of these medications. For more information, please review the drug information sheets that come with your  medication package from the pharmacy.  ---------------------------------------------------------------- GENERAL HELPFUL HINTS ON HCV THERAPY: 1. Stay well-hydrated. 2. Notify the [facility] of any changes in your other over-the-counter/herbal or prescription medications. 3. If you miss a dose of your medication, take the missed dose as soon as you remember. Return to your regular time/dose schedule the next day.  4.  Do not stop taking your medications without first talking with your healthcare provider. 5.  You may take Tylenol (acetaminophen), as long as the dose is less than 2000 mg (OR no more than 4 tablets of the Tylenol Extra Strengths 500mg  tablet) in 24 hours. 6.  You will see our pharmacist-specialist within the first 2 weeks of starting your medication. 7.  You will need to obtain routine labs around week 4 and12 weeks after starting and then 3 to 6 months after finishing Zepatier.   8.  If ribavirin is part of your regimen, you also will have a lab visit within 2 weeks.   Scharlene Gloss, [place] [facility] [place], [place]  [id number] [phone]

## 2016-03-23 NOTE — Progress Notes (Signed)
[facility] Infectious Disease   CC: consideration for treatment for chronic hepatitis C  HPI:  +Johnny [name] is a 55 y.o. male who presents for initial evaluation and management of chronic hepatitis C.  Patient tested positive earlier this year by nephrology. Hepatitis C-associated risk factors present are: none. Patient denies history of blood transfusion, intranasal drug use, IV drug abuse, renal dialysis. Patient has had other studies performed. Results: hepatitis C RNA by PCR, result: positive. Patient has not had prior treatment for Hepatitis C. Patient does not have a past history of liver disease. Patient does not have a family history of liver disease. Patient does not  have associated signs or symptoms related to liver disease.  Labs reviewed and confirm chronic hepatitis C with a positive viral load.   Records reviewed from nephrology and Epic.  GFR seems to be around or lower than 30.       Patient does have documented immunity to Hepatitis A. Patient does have documented immunity to Hepatitis B.    Review of Systems:  Constitutional: negative for fatigue and malaise Gastrointestinal: negative for diarrhea Musculoskeletal: negative for myalgias and arthralgias All other systems reviewed and are negative      Past Medical History  Diagnosis Date  . Hypertension   . Chronic kidney disease   . COPD (chronic obstructive pulmonary disease) ([place])   . Hepatitis C   . OSA (obstructive sleep apnea) 11/03/2015    Prior to Admission medications   Medication Sig Start Date End Date Taking? Authorizing Provider  albuterol (PROVENTIL HFA;VENTOLIN HFA) 108 (90 BASE) MCG/ACT inhaler Inhale into the lungs every 6 (six) hours as needed for wheezing or shortness of breath. Reported on 12/09/2015   Yes Historical Provider, MD  ALLOPURINOL PO Take by mouth 2 (two) times daily.    Yes Historical Provider, MD  colchicine 0.6 MG tablet Take 1 tablet (0.6 mg total) by mouth 2 (two)  times daily. Patient taking differently: Take 0.6 mg by mouth 2 (two) times daily as needed.  07/02/15  Yes Billy Fischer, MD  labetalol (NORMODYNE) 200 MG tablet Take 3 tablets (600 mg total) by mouth 2 (two) times daily. 02/02/16  Yes Marcial Pacas, MD  traMADol (ULTRAM) 50 MG tablet Take 1 tablet (50 mg total) by mouth every 6 (six) hours as needed. 04/04/15  Yes Gregor Hams, MD  triamterene-hydrochlorothiazide (DYAZIDE) 37.5-25 MG capsule Take one tab in the morning, last dose at noon 02/02/16  Yes Marcial Pacas, MD  Elbasvir-Grazoprevir (ZEPATIER) 50-100 MG TABS Take 1 tablet by mouth daily. 03/21/16   Thayer Headings, MD    Allergies  Allergen Reactions  . Heparin     Pt reports he's not sure what type of reaction  . Naproxen     Social History  Substance Use Topics  . Smoking status: Former Smoker -- 0.10 packs/day    Types: Cigarettes    Quit date: 04/02/2011  . Smokeless tobacco: Never Used     Comment: trying to quit  . Alcohol Use: No    Family History  Problem Relation Age of Onset  . Hypertension Mother   . Diabetes Mother   . Colon cancer Neg Hx       Objective:  Constitutional: in no apparent distress and alert,  Filed Vitals:   03/21/16 1601  BP: 159/94  Pulse: 66  Temp: 98.5 F (36.9 C)   Eyes: anicteric Cardiovascular: Cor RRR and No murmurs Respiratory: CTA  B; normal respiratory effort Gastrointestinal: Bowel sounds are normal, liver is not enlarged, spleen is not enlarged Musculoskeletal: peripheral pulses normal, no pedal edema, no clubbing or cyanosis Skin: negative for - jaundice, spider hemangioma, telangiectasia, palmar erythema, ecchymosis and atrophy; no porphyria cutanea tarda Lymphatic: no cervical lymphadenopathy   Laboratory Genotype: No results found for: HCVGENOTYPE HCV viral load:  Lab Results  Component Value Date   HCVQUANT 595000* 02/23/2009   Lab Results  Component Value Date   WBC 10.9* 07/22/2011   HGB 13.3 07/22/2011   HCT  38.2* 07/22/2011   MCV 77.0* 07/22/2011   PLT 188 07/22/2011    Lab Results  Component Value Date   CREATININE 3.21* 07/02/2015   BUN 46* 07/02/2015   NA 135 07/02/2015   K 3.9 07/02/2015   CL 99* 07/02/2015   CO2 26 07/02/2015    Lab Results  Component Value Date   ALT 28 07/22/2011   AST 28 07/22/2011   ALKPHOS 75 07/22/2011     Labs and history reviewed and show CHILD-PUGH A  5-6 points: Child class A 7-9 points: Child class B 10-15 points: Child class C  Lab Results  Component Value Date   INR 1.03 10/25/2015   BILITOT 1.4* 07/22/2011   ALBUMIN 3.5 07/22/2011     Assessment: New Patient with Chronic Hepatitis C genotype 1a, untreated.  I discussed with the patient the lab findings that confirm chronic hepatitis C as well as the natural history and progression of disease including about 30% of people who develop cirrhosis of the liver if left untreated and once cirrhosis is established there is a 2-7% risk per year of liver cancer and liver failure.  I discussed the importance of treatment and benefits in reducing the risk, even if significant liver fibrosis exists.   Plan: 1) Patient counseled extensively on limiting acetaminophen to no more than 2 grams daily, avoidance of alcohol. 2) Transmission discussed with patient including sexual transmission, sharing razors and toothbrush.   3) Will need referral to gastroenterology if concern for cirrhosis 4) Will need referral for substance abuse counseling: No.; Further work up to include urine drug screen  No. 5) Will prescribe Zepatier for 12 weeks or 16 weeks with ribavirin if any NS5A resistance found 6) Hepatitis A vaccine No. 7) Hepatitis B vaccine No. 8) Pneumovax vaccine if concern for cirrhosis 9) Further work up to include liver staging with elastography 10) NS5A test  Yes.  , is negative for resistance 10) will follow up after starting medication

## 2016-03-25 LAB — LIVER FIBROSIS, FIBROTEST-ACTITEST
ALT: 28 U/L (ref 9–46)
APOLIPOPROTEIN A1: 198 mg/dL — AB (ref 94–176)
Alpha-2-Macroglobulin: 312 mg/dL — ABNORMAL HIGH (ref 106–279)
BILIRUBIN: 0.6 mg/dL (ref 0.2–1.2)
Fibrosis Score: 0.59
GGT: 469 U/L — AB (ref 3–95)
HAPTOGLOBIN: 141 mg/dL (ref 43–212)
Necroinflammat ACT Score: 0.19
Reference ID: 1525192

## 2016-03-28 ENCOUNTER — Telehealth: Payer: Self-pay

## 2016-03-28 NOTE — Telephone Encounter (Signed)
Called patient to notify him of elastography appointment at [facility] Radiology Department on Tuesday 6/20 at 11am. He is to arrive by 10:45. Nothing to eat or drink after midnight. Medicaid approved his procedure until 6/21. Could not leave message due to voicemail being full. Rodman Key, LPN

## 2016-04-23 ENCOUNTER — Telehealth (HOSPITAL_COMMUNITY): Payer: Self-pay

## 2016-04-23 NOTE — Telephone Encounter (Signed)
Called to remind pt of 11am appt at [facility] on 04/24/16. No answer, mailbox was full. AW

## 2016-04-24 ENCOUNTER — Ambulatory Visit (HOSPITAL_COMMUNITY): Payer: Medicaid Other

## 2016-06-01 ENCOUNTER — Telehealth: Payer: Self-pay | Admitting: *Deleted

## 2016-06-01 ENCOUNTER — Emergency Department (HOSPITAL_COMMUNITY)
Admission: EM | Admit: 2016-06-01 | Discharge: 2016-06-01 | Disposition: A | Discharge status: Home / Self Care | Payer: Medicaid Other | Attending: Emergency Medicine | Admitting: Emergency Medicine

## 2016-06-01 ENCOUNTER — Encounter (HOSPITAL_COMMUNITY): Payer: Self-pay | Admitting: Emergency Medicine

## 2016-06-01 DIAGNOSIS — I10 Essential (primary) hypertension: Secondary | ICD-10-CM

## 2016-06-01 DIAGNOSIS — N186 End stage renal disease: Secondary | ICD-10-CM | POA: Insufficient documentation

## 2016-06-01 DIAGNOSIS — A64 Unspecified sexually transmitted disease: Secondary | ICD-10-CM | POA: Insufficient documentation

## 2016-06-01 DIAGNOSIS — Z87891 Personal history of nicotine dependence: Secondary | ICD-10-CM | POA: Insufficient documentation

## 2016-06-01 DIAGNOSIS — Z79899 Other long term (current) drug therapy: Secondary | ICD-10-CM | POA: Insufficient documentation

## 2016-06-01 DIAGNOSIS — I120 Hypertensive chronic kidney disease with stage 5 chronic kidney disease or end stage renal disease: Secondary | ICD-10-CM | POA: Insufficient documentation

## 2016-06-01 DIAGNOSIS — Z202 Contact with and (suspected) exposure to infections with a predominantly sexual mode of transmission: Secondary | ICD-10-CM

## 2016-06-01 DIAGNOSIS — J449 Chronic obstructive pulmonary disease, unspecified: Secondary | ICD-10-CM

## 2016-06-01 MED ORDER — ALBUTEROL SULFATE HFA 108 (90 BASE) MCG/ACT IN AERS
2.0000 | INHALATION_SPRAY | RESPIRATORY_TRACT | Status: DC | PRN
Start: 2016-06-01 — End: 2016-06-01
  Administered 2016-06-01: 2 via RESPIRATORY_TRACT
  Filled 2016-06-01: qty 6.7

## 2016-06-01 MED ORDER — ALBUTEROL SULFATE HFA 108 (90 BASE) MCG/ACT IN AERS: 2.0000 | INHALATION_SPRAY | Freq: Four times a day (QID) | RESPIRATORY_TRACT | 2 refills | Status: DC | PRN

## 2016-06-01 MED ORDER — LIDOCAINE HCL (PF) 1 % IJ SOLN
INTRAMUSCULAR | Status: AC
  Administered 2016-06-01: 5 mL
  Filled 2016-06-01: qty 5

## 2016-06-01 MED ORDER — LABETALOL HCL 200 MG PO TABS: 600.0000 mg | ORAL_TABLET | Freq: Two times a day (BID) | ORAL | 4 refills | Status: DC

## 2016-06-01 MED ORDER — AZITHROMYCIN 250 MG PO TABS
1000.0000 mg | ORAL_TABLET | Freq: Once | ORAL | Status: AC
  Administered 2016-06-01: 1000 mg via ORAL
  Filled 2016-06-01: qty 4

## 2016-06-01 MED ORDER — CEFTRIAXONE SODIUM 250 MG IJ SOLR
250.0000 mg | Freq: Once | INTRAMUSCULAR | Status: AC
  Administered 2016-06-01: 250 mg via INTRAMUSCULAR
  Filled 2016-06-01: qty 250

## 2016-06-01 NOTE — ED Triage Notes (Signed)
Patient reports he is out of Labetolol and ProAir inhaler. Reports that his prior PCP has expired, and he will be being seen in [facility], but has not yet seen a physician & needs medication refills.

## 2016-06-01 NOTE — ED Triage Notes (Signed)
Also reports that "lady friend" reported that she has gonorrhea & that he wanted to be treated for STD.

## 2016-06-01 NOTE — ED Provider Notes (Signed)
[facility] DEPT Provider Note   CSN: [id number] Arrival date & time: 06/01/16  [id number]  First Provider Contact:  None       History   Chief Complaint No chief complaint on file.   HPI Johnny [name] is a 55 y.o. male.  Patient presents to the emergency department with multiple complaints. Patient reports that he is out of his labetalol and his albuterol inhaler. He has been unable to follow-up with his doctor because his doctor died. He has an appointment for a new doctor, but has not happened yet and he needs meds refilled. He is not having any active COPD exacerbation. He is not spitting any chest pain or shortness of breath. Patient is concerned about the possibility of gonorrhea, reports that he did have unprotected sex with a male that has been diagnosed with gonorrhea. He denies any urethral discharge or irritation. No testicular pain or swelling.      Past Medical History:  Diagnosis Date  . Chronic kidney disease   . COPD (chronic obstructive pulmonary disease) ([place])   . Hepatitis C   . Hypertension   . OSA (obstructive sleep apnea) 11/03/2015    Patient Active Problem List   Diagnosis Date Noted  . Chronic hepatitis C without hepatic coma ([place]) 03/21/2016  . Excessive sleepiness 02/02/2016  . OSA (obstructive sleep apnea) 11/03/2015  . End stage renal disease ([place]) 04/01/2012    Past Surgical History:  Procedure Laterality Date  . AV FISTULA PLACEMENT  09/12/2009   Left arm AVF       Home Medications    Prior to Admission medications   Medication Sig Start Date End Date Taking? Authorizing Provider  albuterol (PROVENTIL HFA;VENTOLIN HFA) 108 (90 BASE) MCG/ACT inhaler Inhale into the lungs every 6 (six) hours as needed for wheezing or shortness of breath. Reported on 12/09/2015    Historical Provider, MD  ALLOPURINOL PO Take by mouth 2 (two) times daily.     Historical Provider, MD  colchicine 0.6 MG tablet Take 1 tablet (0.6 mg total) by mouth 2 (two)  times daily. Patient taking differently: Take 0.6 mg by mouth 2 (two) times daily as needed.  07/02/15   Billy Fischer, MD  Elbasvir-Grazoprevir (ZEPATIER) 50-100 MG TABS Take 1 tablet by mouth daily. 03/21/16   Thayer Headings, MD  labetalol (NORMODYNE) 200 MG tablet Take 3 tablets (600 mg total) by mouth 2 (two) times daily. 02/02/16   Marcial Pacas, MD  traMADol (ULTRAM) 50 MG tablet Take 1 tablet (50 mg total) by mouth every 6 (six) hours as needed. 04/04/15   Gregor Hams, MD  triamterene-hydrochlorothiazide (DYAZIDE) 37.5-25 MG capsule Take one tab in the morning, last dose at noon 02/02/16   Marcial Pacas, MD    Family History Family History  Problem Relation Age of Onset  . Hypertension Mother   . Diabetes Mother   . Colon cancer Neg Hx     Social History Social History  Substance Use Topics  . Smoking status: Former Smoker    Packs/day: 0.10    Types: Cigarettes    Quit date: 04/02/2011  . Smokeless tobacco: Never Used     Comment: trying to quit  . Alcohol use No     Allergies   Heparin and Naproxen   Review of Systems Review of Systems  Respiratory: Negative for shortness of breath.   Cardiovascular: Negative for chest pain.  All other systems reviewed and are negative.    Physical Exam  Updated Vital Signs BP (!) 163/104 (BP Location: Right Arm)   Pulse 70   Temp 99 F (37.2 C) (Oral)   Resp 18   SpO2 100%   Physical Exam  Constitutional: He is oriented to person, place, and time. He appears well-developed and well-nourished. No distress.  HENT:  Head: Normocephalic and atraumatic.  Right Ear: Hearing normal.  Left Ear: Hearing normal.  Nose: Nose normal.  Mouth/Throat: Oropharynx is clear and moist and mucous membranes are normal.  Eyes: Conjunctivae and EOM are normal. Pupils are equal, round, and reactive to light.  Neck: Normal range of motion. Neck supple.  Cardiovascular: Regular rhythm, S1 normal and S2 normal.  Exam reveals no gallop and no friction  rub.   No murmur heard. Pulmonary/Chest: Effort normal and breath sounds normal. No respiratory distress. He exhibits no tenderness.  Abdominal: Soft. Normal appearance and bowel sounds are normal. There is no hepatosplenomegaly. There is no tenderness. There is no rebound, no guarding, no tenderness at McBurney's point and negative Murphy's sign. No hernia.  Musculoskeletal: Normal range of motion.  Neurological: He is alert and oriented to person, place, and time. He has normal strength. No cranial nerve deficit or sensory deficit. Coordination normal. GCS eye subscore is 4. GCS verbal subscore is 5. GCS motor subscore is 6.  Skin: Skin is warm, dry and intact. No rash noted. No cyanosis.  Psychiatric: He has a normal mood and affect. His speech is normal and behavior is normal. Thought content normal.  Nursing note and vitals reviewed.    ED Treatments / Results  Labs (all labs ordered are listed, but only abnormal results are displayed) Labs Reviewed - No data to display  EKG  EKG Interpretation None       Radiology No results found.  Procedures Procedures (including critical care time)  Medications Ordered in ED Medications  cefTRIAXone (ROCEPHIN) injection 250 mg (not administered)  azithromycin (ZITHROMAX) tablet 1,000 mg (not administered)     Initial Impression / Assessment and Plan / ED Course  I have reviewed the triage vital signs and the nursing notes.  Pertinent labs & imaging results that were available during my care of the patient were reviewed by me and considered in my medical decision making (see chart for details).  Clinical Course  Patient presents asking for medications for his chronic COPD and hypertension. He is establishing care with a new physician but has not been seen yet and needs his medications. He is asymptomatic at time of evaluation. Medications were scribed. Patient concerned about STD exposure. He is not symptomatic, will therefore be  treated empirically.  Final Clinical Impressions(s) / ED Diagnoses   Final diagnoses:  None  STD exposure COPD Essential hypertension  New Prescriptions New Prescriptions   No medications on file     Orpah Greek, MD 06/01/16 [phone]

## 2016-06-04 ENCOUNTER — Other Ambulatory Visit: Payer: Self-pay | Admitting: Pharmacist Clinician (PhC)/ Clinical Pharmacy Specialist

## 2016-06-04 MED ORDER — ELBASVIR-GRAZOPREVIR 50-100 MG PO TABS: 1.0000 | ORAL_TABLET | Freq: Every day | ORAL | 2 refills | Status: DC

## 2016-06-04 NOTE — Telephone Encounter (Signed)
Received voicemail at [facility] triage, will forward to Ronney Asters for medication assistance programs - Patient is positive for Hepatitis C, not HIV. Landis Gandy, RN

## 2016-06-06 ENCOUNTER — Inpatient Hospital Stay (HOSPITAL_COMMUNITY)
Admission: EM | Admit: 2016-06-06 | Discharge: 2016-06-08 | DRG: 191 | Disposition: A | Discharge status: Home / Self Care | Payer: Medicaid Other | Attending: Internal Medicine | Admitting: Internal Medicine

## 2016-06-06 ENCOUNTER — Emergency Department (HOSPITAL_COMMUNITY): Payer: Self-pay

## 2016-06-06 ENCOUNTER — Encounter (HOSPITAL_COMMUNITY): Payer: Self-pay | Admitting: Emergency Medicine

## 2016-06-06 DIAGNOSIS — R112 Nausea with vomiting, unspecified: Secondary | ICD-10-CM | POA: Diagnosis present

## 2016-06-06 DIAGNOSIS — I129 Hypertensive chronic kidney disease with stage 1 through stage 4 chronic kidney disease, or unspecified chronic kidney disease: Secondary | ICD-10-CM | POA: Diagnosis present

## 2016-06-06 DIAGNOSIS — N184 Chronic kidney disease, stage 4 (severe): Secondary | ICD-10-CM | POA: Diagnosis present

## 2016-06-06 DIAGNOSIS — B1920 Unspecified viral hepatitis C without hepatic coma: Secondary | ICD-10-CM | POA: Diagnosis present

## 2016-06-06 DIAGNOSIS — Z833 Family history of diabetes mellitus: Secondary | ICD-10-CM

## 2016-06-06 DIAGNOSIS — J441 Chronic obstructive pulmonary disease with (acute) exacerbation: Secondary | ICD-10-CM

## 2016-06-06 DIAGNOSIS — K529 Noninfective gastroenteritis and colitis, unspecified: Secondary | ICD-10-CM | POA: Diagnosis present

## 2016-06-06 DIAGNOSIS — Z8249 Family history of ischemic heart disease and other diseases of the circulatory system: Secondary | ICD-10-CM

## 2016-06-06 DIAGNOSIS — J209 Acute bronchitis, unspecified: Secondary | ICD-10-CM | POA: Diagnosis present

## 2016-06-06 DIAGNOSIS — G4733 Obstructive sleep apnea (adult) (pediatric): Secondary | ICD-10-CM | POA: Diagnosis present

## 2016-06-06 DIAGNOSIS — J069 Acute upper respiratory infection, unspecified: Secondary | ICD-10-CM

## 2016-06-06 DIAGNOSIS — R197 Diarrhea, unspecified: Secondary | ICD-10-CM

## 2016-06-06 DIAGNOSIS — N185 Chronic kidney disease, stage 5: Secondary | ICD-10-CM | POA: Diagnosis present

## 2016-06-06 DIAGNOSIS — Z87891 Personal history of nicotine dependence: Secondary | ICD-10-CM

## 2016-06-06 DIAGNOSIS — J440 Chronic obstructive pulmonary disease with acute lower respiratory infection: Principal | ICD-10-CM | POA: Diagnosis present

## 2016-06-06 DIAGNOSIS — I10 Essential (primary) hypertension: Secondary | ICD-10-CM | POA: Diagnosis present

## 2016-06-06 DIAGNOSIS — J449 Chronic obstructive pulmonary disease, unspecified: Secondary | ICD-10-CM | POA: Diagnosis present

## 2016-06-06 LAB — COMPREHENSIVE METABOLIC PANEL
ALBUMIN: 3.2 g/dL — AB (ref 3.5–5.0)
ALK PHOS: 87 U/L (ref 38–126)
ALT: 39 U/L (ref 17–63)
AST: 37 U/L (ref 15–41)
Anion gap: 6 (ref 5–15)
BILIRUBIN TOTAL: 1.4 mg/dL — AB (ref 0.3–1.2)
BUN: 20 mg/dL (ref 6–20)
CO2: 24 mmol/L (ref 22–32)
Calcium: 9 mg/dL (ref 8.9–10.3)
Chloride: 106 mmol/L (ref 101–111)
Creatinine, Ser: 2.22 mg/dL — ABNORMAL HIGH (ref 0.61–1.24)
GFR calc Af Amer: 37 mL/min — ABNORMAL LOW (ref >60)
GFR calc non Af Amer: 32 mL/min — ABNORMAL LOW (ref >60)
GLUCOSE: 112 mg/dL — AB (ref 65–99)
POTASSIUM: 3.7 mmol/L (ref 3.5–5.1)
Sodium: 136 mmol/L (ref 135–145)
TOTAL PROTEIN: 7.2 g/dL (ref 6.5–8.1)

## 2016-06-06 LAB — CBC
HEMATOCRIT: 41.7 % (ref 39.0–52.0)
Hemoglobin: 14.5 g/dL (ref 13.0–17.0)
MCH: 27.8 pg (ref 26.0–34.0)
MCHC: 34.8 g/dL (ref 30.0–36.0)
MCV: 80 fL (ref 78.0–100.0)
Platelets: 163 10*3/uL (ref 150–400)
RBC: 5.21 MIL/uL (ref 4.22–5.81)
RDW: 14.2 % (ref 11.5–15.5)
WBC: 9.7 10*3/uL (ref 4.0–10.5)

## 2016-06-06 LAB — LIPASE, BLOOD: Lipase: 21 U/L (ref 11–51)

## 2016-06-06 MED ORDER — IPRATROPIUM-ALBUTEROL 0.5-2.5 (3) MG/3ML IN SOLN
3.0000 mL | Freq: Once | RESPIRATORY_TRACT | Status: AC
  Administered 2016-06-07: 3 mL via RESPIRATORY_TRACT
  Filled 2016-06-06: qty 3

## 2016-06-06 NOTE — ED Notes (Signed)
Pt taken to xray via bed.

## 2016-06-06 NOTE — ED Triage Notes (Signed)
Pt. presents with multiple complaints :  Emesis , diarrhea , sore throat , produtive cough , generalized weakness/fatigue onset this week , denies fever or chills.

## 2016-06-06 NOTE — ED Provider Notes (Signed)
[facility] DEPT Provider Note   CSN: [id number] Arrival date & time: 06/06/16  [id number]  First Provider Contact: 06/06/2016 10:36 PM      History   Chief Complaint Chief Complaint  Patient presents with  . Emesis  . Diarrhea  . Cough  . Fatigue  . Sore Throat    HPI Johnny [name] is a 55 y.o. male.  HPI   Johnny [name] is a 55 y.o. male, with a history of COPD, CKD, HTN, and , presenting to the ED with productive cough with white sputum, N/VD, subjective fever, and occasional shortness of breath with the cough, all beginning last night. Endorses 2-3 episodes of emesis over the last 24 hours. Denies current nausea or shortness of breath. Has been using his albuterol inhaler every 3-4 hours, as well as mucinex, with some relief. Pt has not been taking anything else for his symptoms. Pt is not a dialysis patient, but has a fistula in place in the left arm. Pt denies CP, abdominal pain, dizziness, diaphoresis, or any other complaints.    Past Medical History:  Diagnosis Date  . Chronic kidney disease   . COPD (chronic obstructive pulmonary disease) ([place])   . Hepatitis C   . Hypertension   . OSA (obstructive sleep apnea) 11/03/2015    Patient Active Problem List   Diagnosis Date Noted  . Chronic hepatitis C without hepatic coma ([place]) 03/21/2016  . Excessive sleepiness 02/02/2016  . OSA (obstructive sleep apnea) 11/03/2015  . End stage renal disease ([place]) 04/01/2012    Past Surgical History:  Procedure Laterality Date  . AV FISTULA PLACEMENT  09/12/2009   Left arm AVF       Home Medications    Prior to Admission medications   Medication Sig Start Date End Date Taking? Authorizing Provider  albuterol (PROVENTIL HFA;VENTOLIN HFA) 108 (90 Base) MCG/ACT inhaler Inhale 2 puffs into the lungs every 6 (six) hours as needed for wheezing or shortness of breath. Reported on 12/09/2015 06/01/16   Orpah Greek, MD  albuterol (PROVENTIL HFA;VENTOLIN HFA) 108 (90 Base)  MCG/ACT inhaler Inhale 2 puffs into the lungs every 4 (four) hours as needed for wheezing or shortness of breath. 06/07/16   Genetta Fiero C Stepen Prins, PA-C  ALLOPURINOL PO Take by mouth 2 (two) times daily.     Historical Provider, MD  benzonatate (TESSALON) 100 MG capsule Take 1 capsule (100 mg total) by mouth every 8 (eight) hours. 06/07/16   Keenan Trefry C Darlina Mccaughey, PA-C  colchicine 0.6 MG tablet Take 1 tablet (0.6 mg total) by mouth 2 (two) times daily. Patient taking differently: Take 0.6 mg by mouth 2 (two) times daily as needed.  07/02/15   Billy Fischer, MD  Elbasvir-Grazoprevir (ZEPATIER) 50-100 MG TABS Take 1 tablet by mouth daily. 06/04/16   Thayer Headings, MD  labetalol (NORMODYNE) 200 MG tablet Take 3 tablets (600 mg total) by mouth 2 (two) times daily. 06/01/16   Orpah Greek, MD  ondansetron (ZOFRAN ODT) 4 MG disintegrating tablet Take 1 tablet (4 mg total) by mouth every 8 (eight) hours as needed for nausea or vomiting. 06/07/16   Nneka Blanda C Judieth Mckown, PA-C  traMADol (ULTRAM) 50 MG tablet Take 1 tablet (50 mg total) by mouth every 6 (six) hours as needed. 04/04/15   Gregor Hams, MD  triamterene-hydrochlorothiazide (DYAZIDE) 37.5-25 MG capsule Take one tab in the morning, last dose at noon 02/02/16   Marcial Pacas, MD    Family History Family History  Problem Relation Age of Onset  . Hypertension Mother   . Diabetes Mother   . Colon cancer Neg Hx     Social History Social History  Substance Use Topics  . Smoking status: Former Smoker    Packs/day: 0.10    Types: Cigarettes    Quit date: 04/02/2011  . Smokeless tobacco: Never Used     Comment: trying to quit  . Alcohol use No     Allergies   Heparin and Naproxen   Review of Systems Review of Systems  Constitutional: Positive for fever.  Respiratory: Positive for cough and shortness of breath (intermittent, with cough).   Cardiovascular: Negative for chest pain.  Gastrointestinal: Positive for diarrhea, nausea and vomiting. Negative for abdominal  pain.  All other systems reviewed and are negative.    Physical Exam Updated Vital Signs BP 169/96   Pulse 76   Temp 99.1 F (37.3 C)   Resp 18   SpO2 94%   Physical Exam  Constitutional: He appears well-developed and well-nourished. No distress.  HENT:  Head: Normocephalic and atraumatic.  Eyes: Conjunctivae are normal.  Neck: Neck supple.  Cardiovascular: Normal rate, regular rhythm, normal heart sounds and intact distal pulses.   Pulmonary/Chest: No accessory muscle usage. No respiratory distress. He has wheezes in the right upper field, the right middle field, the right lower field, the left upper field, the left middle field and the left lower field.  Some increased work of breathing, but patient speaks in full sentences without difficulty.  Abdominal: Soft. There is no tenderness. There is no guarding.  Musculoskeletal: He exhibits no edema or tenderness.  Lymphadenopathy:    He has no cervical adenopathy.  Neurological: He is alert.  Skin: Skin is warm and dry. He is not diaphoretic.  Psychiatric: He has a normal mood and affect. His behavior is normal.  Nursing note and vitals reviewed.    ED Treatments / Results  Labs (all labs ordered are listed, but only abnormal results are displayed) Labs Reviewed  COMPREHENSIVE METABOLIC PANEL - Abnormal; Notable for the following:       Result Value   Glucose, Bld 112 (*)    Creatinine, Ser 2.22 (*)    Albumin 3.2 (*)    Total Bilirubin 1.4 (*)    GFR calc non Af Amer 32 (*)    GFR calc Af Amer 37 (*)    All other components within normal limits  LIPASE, BLOOD  CBC  URINALYSIS, ROUTINE W REFLEX MICROSCOPIC (NOT AT [facility])   BUN  Date Value Ref Range Status  06/06/2016 20 6 - 20 mg/dL Final  07/02/2015 46 (H) 6 - 20 mg/dL Final  07/22/2011 27 (H) 6 - 23 mg/dL Final  07/10/2009 38 (H) 6 - 23 mg/dL Final   Creatinine, Ser  Date Value Ref Range Status  06/06/2016 2.22 (H) 0.61 - 1.24 mg/dL Final  07/02/2015 3.21  (H) 0.61 - 1.24 mg/dL Final  07/22/2011 2.62 (H) 0.50 - 1.35 mg/dL Final  07/10/2009 3.4 (H) 0.4 - 1.5 mg/dL Final    EKG  EKG Interpretation  Date/Time:  Wednesday June 06 2016 23:23:26 EDT Ventricular Rate:  80 PR Interval:    QRS Duration: 99 QT Interval:  398 QTC Calculation: 460 R Axis:   58 Text Interpretation:  Sinus rhythm Biatrial enlargement RSR' in V1 or V2, probably normal variant Abnormal T, consider ischemia, inferior leads ST elevation, consider anterior injury Confirmed by DELO  MD, DOUGLAS ([id number]) on 06/07/2016 1:46:15  AM       Radiology Dg Chest 2 View  Result Date: 06/06/2016 CLINICAL DATA:  55 year old male with shortness of breath and cough today. Initial encounter. Former smoker. EXAM: CHEST  2 VIEW COMPARISON:  07/22/2011. FINDINGS: Stable cardiomegaly and mediastinal contours. Stable lung volumes since 2012 at the upper limits of normal. Visualized tracheal air column is within normal limits. No pneumothorax, pleural effusion or consolidation. Chronic increased interstitial markings appear stable. Previously-seen patchy right upper lobe opacity has resolved. No acute or confluent pulmonary opacity identified. No acute osseous abnormality identified. IMPRESSION: Stable cardiomegaly and pulmonary interstitial changes. No acute cardiopulmonary abnormality. Electronically Signed   By: Genevie Ann M.D.   On: 06/06/2016 23:45    Procedures Procedures (including critical care time)  Medications Ordered in ED Medications  ipratropium-albuterol (DUONEB) 0.5-2.5 (3) MG/3ML nebulizer solution 3 mL (3 mLs Nebulization Given 06/07/16 0006)  ipratropium-albuterol (DUONEB) 0.5-2.5 (3) MG/3ML nebulizer solution 3 mL (3 mLs Nebulization Given 06/07/16 0030)     Initial Impression / Assessment and Plan / ED Course  I have reviewed the triage vital signs and the nursing notes.  Pertinent labs & imaging results that were available during my care of the patient were reviewed by me  and considered in my medical decision making (see chart for details).  Clinical Course  Value Comment By Time  DG Chest 2 View Stable from previous Lorayne Bender, [name] 08/03 0059    [name] presents with URI symptoms combined with wheezing and some shortness of breath for the past 24 hours.  Findings and plan of care discussed with Varney Biles, MD.   Patient's symptoms are consistent with a viral illness causing a COPD exacerbation. Patient is nontoxic appearing, afebrile, not tachycardic, not tachypneic, and not hypotensive. Patient has no signs of sepsis. EKG was compared to a previous EKG found in the MUSE system. Today's EKG shows no abnormalities not found in the previous EKG. Patient's HTN is noted. Pt states he has taken all his medications today, as prescribed. No symptoms of hypertensive emergency. Upon reassessment, patient states he feels much better. Breathing has improved. Wheezing still present, but improved. After the second breathing treatment, patient states his shortness of breath has continued to improve.  Patient ambulated with a drop in SPO2 to 78%. Pt states he does not feel more short of breath, but the reading was repeated and found to be the same. At rest, patient noted to have SPO2 of 90-94%. Admission due to drop in sat with suspected COPD exacerbation, despite 2 breathing treatments. 2:36 AM Spoke with Dr. Hal Hope, hospitalist, who agreed to admit the patient to telemetry observation.   Vitals:   06/07/16 0030 06/07/16 0045 06/07/16 0046 06/07/16 0115  BP: (!) 171/116 (!) 174/109 (!) 174/109 (!) 167/106  Pulse: 78 73 76 69  Resp: 26 23 (!) 28 (!) 28  Temp:   98.7 F (37.1 C)   TempSrc:   Oral   SpO2: 96% 100% 97% 95%      Final Clinical Impressions(s) / ED Diagnoses   Final diagnoses:  URI (upper respiratory infection)  COPD exacerbation (HCC)    New Prescriptions New Prescriptions   ALBUTEROL (PROVENTIL HFA;VENTOLIN HFA) 108 (90 BASE)  MCG/ACT INHALER    Inhale 2 puffs into the lungs every 4 (four) hours as needed for wheezing or shortness of breath.   BENZONATATE (TESSALON) 100 MG CAPSULE    Take 1 capsule (100 mg total) by mouth every 8 (eight)  hours.   ONDANSETRON (ZOFRAN ODT) 4 MG DISINTEGRATING TABLET    Take 1 tablet (4 mg total) by mouth every 8 (eight) hours as needed for nausea or vomiting.     Lorayne Bender, PA-C 06/07/16 [place], PA-C 06/07/16 [place], MD 06/08/16 [id number]

## 2016-06-07 ENCOUNTER — Encounter (HOSPITAL_COMMUNITY): Payer: Self-pay | Admitting: *Deleted

## 2016-06-07 ENCOUNTER — Telehealth: Payer: Self-pay | Admitting: Pharmacy Technician

## 2016-06-07 DIAGNOSIS — I10 Essential (primary) hypertension: Secondary | ICD-10-CM

## 2016-06-07 DIAGNOSIS — J209 Acute bronchitis, unspecified: Secondary | ICD-10-CM | POA: Diagnosis not present

## 2016-06-07 DIAGNOSIS — N183 Chronic kidney disease, stage 3 (moderate): Secondary | ICD-10-CM

## 2016-06-07 DIAGNOSIS — J441 Chronic obstructive pulmonary disease with (acute) exacerbation: Secondary | ICD-10-CM

## 2016-06-07 DIAGNOSIS — J449 Chronic obstructive pulmonary disease, unspecified: Secondary | ICD-10-CM | POA: Diagnosis present

## 2016-06-07 DIAGNOSIS — R112 Nausea with vomiting, unspecified: Secondary | ICD-10-CM | POA: Diagnosis not present

## 2016-06-07 DIAGNOSIS — R197 Diarrhea, unspecified: Secondary | ICD-10-CM | POA: Diagnosis not present

## 2016-06-07 DIAGNOSIS — N184 Chronic kidney disease, stage 4 (severe): Secondary | ICD-10-CM | POA: Diagnosis present

## 2016-06-07 DIAGNOSIS — N185 Chronic kidney disease, stage 5: Secondary | ICD-10-CM | POA: Diagnosis present

## 2016-06-07 LAB — CBC WITH DIFFERENTIAL/PLATELET
BASOS ABS: 0 10*3/uL (ref 0.0–0.1)
Basophils Relative: 0 %
EOS ABS: 0.2 10*3/uL (ref 0.0–0.7)
EOS PCT: 2 %
HCT: 42.9 % (ref 39.0–52.0)
Hemoglobin: 15 g/dL (ref 13.0–17.0)
LYMPHS PCT: 20 %
Lymphs Abs: 2.2 10*3/uL (ref 0.7–4.0)
MCH: 28.1 pg (ref 26.0–34.0)
MCHC: 35 g/dL (ref 30.0–36.0)
MCV: 80.3 fL (ref 78.0–100.0)
Monocytes Absolute: 1 10*3/uL (ref 0.1–1.0)
Monocytes Relative: 9 %
Neutro Abs: 7.6 10*3/uL (ref 1.7–7.7)
Neutrophils Relative %: 69 %
PLATELETS: 157 10*3/uL (ref 150–400)
RBC: 5.34 MIL/uL (ref 4.22–5.81)
RDW: 14.4 % (ref 11.5–15.5)
WBC: 11 10*3/uL — AB (ref 4.0–10.5)

## 2016-06-07 LAB — URINE MICROSCOPIC-ADD ON: WBC, UA: NONE SEEN WBC/hpf (ref 0–5)

## 2016-06-07 LAB — COMPREHENSIVE METABOLIC PANEL
ALT: 36 U/L (ref 17–63)
AST: 35 U/L (ref 15–41)
Albumin: 3.2 g/dL — ABNORMAL LOW (ref 3.5–5.0)
Alkaline Phosphatase: 90 U/L (ref 38–126)
Anion gap: 10 (ref 5–15)
BILIRUBIN TOTAL: 1.7 mg/dL — AB (ref 0.3–1.2)
BUN: 18 mg/dL (ref 6–20)
CO2: 24 mmol/L (ref 22–32)
CREATININE: 2.12 mg/dL — AB (ref 0.61–1.24)
Calcium: 9.2 mg/dL (ref 8.9–10.3)
Chloride: 100 mmol/L — ABNORMAL LOW (ref 101–111)
GFR calc Af Amer: 39 mL/min — ABNORMAL LOW (ref >60)
GFR, EST NON AFRICAN AMERICAN: 34 mL/min — AB (ref >60)
Glucose, Bld: 109 mg/dL — ABNORMAL HIGH (ref 65–99)
POTASSIUM: 3.6 mmol/L (ref 3.5–5.1)
Sodium: 134 mmol/L — ABNORMAL LOW (ref 135–145)
TOTAL PROTEIN: 7.3 g/dL (ref 6.5–8.1)

## 2016-06-07 LAB — URINALYSIS, ROUTINE W REFLEX MICROSCOPIC
Glucose, UA: NEGATIVE mg/dL
KETONES UR: NEGATIVE mg/dL
Leukocytes, UA: NEGATIVE
NITRITE: NEGATIVE
PH: 6 (ref 5.0–8.0)
Specific Gravity, Urine: 1.026 (ref 1.005–1.030)

## 2016-06-07 LAB — MRSA PCR SCREENING: MRSA by PCR: NEGATIVE

## 2016-06-07 MED ORDER — OXYMETAZOLINE HCL 0.05 % NA SOLN
1.0000 | Freq: Two times a day (BID) | NASAL | Status: DC | PRN
  Filled 2016-06-07 (×2): qty 15

## 2016-06-07 MED ORDER — TRAMADOL HCL 50 MG PO TABS: 50.0000 mg | ORAL_TABLET | Freq: Four times a day (QID) | ORAL | Status: DC | PRN

## 2016-06-07 MED ORDER — ACETAMINOPHEN 325 MG PO TABS: 650.0000 mg | ORAL_TABLET | Freq: Four times a day (QID) | ORAL | Status: DC | PRN

## 2016-06-07 MED ORDER — AZITHROMYCIN 500 MG PO TABS
500.0000 mg | ORAL_TABLET | Freq: Every day | ORAL | Status: DC
  Administered 2016-06-08: 500 mg via ORAL
  Filled 2016-06-07: qty 1

## 2016-06-07 MED ORDER — IPRATROPIUM-ALBUTEROL 0.5-2.5 (3) MG/3ML IN SOLN
3.0000 mL | RESPIRATORY_TRACT | Status: DC | PRN
  Administered 2016-06-07: 3 mL via RESPIRATORY_TRACT
  Filled 2016-06-07: qty 3

## 2016-06-07 MED ORDER — LABETALOL HCL 300 MG PO TABS
300.0000 mg | ORAL_TABLET | Freq: Once | ORAL | Status: AC
  Administered 2016-06-07: 300 mg via ORAL
  Filled 2016-06-07: qty 1

## 2016-06-07 MED ORDER — IPRATROPIUM-ALBUTEROL 0.5-2.5 (3) MG/3ML IN SOLN
3.0000 mL | RESPIRATORY_TRACT | Status: DC
  Administered 2016-06-07 (×2): 3 mL via RESPIRATORY_TRACT
  Filled 2016-06-07 (×2): qty 3

## 2016-06-07 MED ORDER — ONDANSETRON HCL 4 MG/2ML IJ SOLN: 4.0000 mg | Freq: Four times a day (QID) | INTRAMUSCULAR | Status: DC | PRN

## 2016-06-07 MED ORDER — ALBUTEROL SULFATE HFA 108 (90 BASE) MCG/ACT IN AERS: 2.0000 | INHALATION_SPRAY | RESPIRATORY_TRACT | 1 refills | Status: DC | PRN

## 2016-06-07 MED ORDER — ALBUTEROL SULFATE (2.5 MG/3ML) 0.083% IN NEBU: 2.5000 mg | INHALATION_SOLUTION | Freq: Four times a day (QID) | RESPIRATORY_TRACT | Status: DC | PRN

## 2016-06-07 MED ORDER — COLCHICINE 0.6 MG PO TABS
0.6000 mg | ORAL_TABLET | Freq: Two times a day (BID) | ORAL | Status: DC
  Administered 2016-06-07 – 2016-06-08 (×3): 0.6 mg via ORAL
  Filled 2016-06-07 (×3): qty 1

## 2016-06-07 MED ORDER — ONDANSETRON 4 MG PO TBDP: 4.0000 mg | ORAL_TABLET | Freq: Three times a day (TID) | ORAL | 0 refills | Status: DC | PRN

## 2016-06-07 MED ORDER — LABETALOL HCL 300 MG PO TABS
900.0000 mg | ORAL_TABLET | Freq: Two times a day (BID) | ORAL | Status: DC
Start: 2016-06-07 — End: 2016-06-08
  Administered 2016-06-08: 900 mg via ORAL
  Filled 2016-06-07: qty 3

## 2016-06-07 MED ORDER — HYDRALAZINE HCL 20 MG/ML IJ SOLN
10.0000 mg | INTRAMUSCULAR | Status: DC | PRN
  Administered 2016-06-07 (×2): 10 mg via INTRAVENOUS
  Filled 2016-06-07 (×2): qty 1

## 2016-06-07 MED ORDER — IPRATROPIUM-ALBUTEROL 0.5-2.5 (3) MG/3ML IN SOLN
3.0000 mL | Freq: Once | RESPIRATORY_TRACT | Status: AC
  Administered 2016-06-07: 3 mL via RESPIRATORY_TRACT
  Filled 2016-06-07: qty 3

## 2016-06-07 MED ORDER — WHITE PETROLATUM GEL
Status: AC
  Administered 2016-06-07: 0.2
  Filled 2016-06-07: qty 1

## 2016-06-07 MED ORDER — AMLODIPINE BESYLATE 10 MG PO TABS
10.0000 mg | ORAL_TABLET | Freq: Every day | ORAL | Status: DC
  Administered 2016-06-07 – 2016-06-08 (×2): 10 mg via ORAL
  Filled 2016-06-07 (×2): qty 1

## 2016-06-07 MED ORDER — AZITHROMYCIN 250 MG PO TABS
500.0000 mg | ORAL_TABLET | Freq: Once | ORAL | Status: AC
  Administered 2016-06-07: 500 mg via ORAL
  Filled 2016-06-07: qty 2

## 2016-06-07 MED ORDER — IPRATROPIUM-ALBUTEROL 0.5-2.5 (3) MG/3ML IN SOLN: 3.0000 mL | Freq: Four times a day (QID) | RESPIRATORY_TRACT | Status: DC

## 2016-06-07 MED ORDER — ENOXAPARIN SODIUM 40 MG/0.4ML ~~LOC~~ SOLN
40.0000 mg | SUBCUTANEOUS | Status: DC
  Administered 2016-06-07: 40 mg via SUBCUTANEOUS
  Filled 2016-06-07: qty 0.4

## 2016-06-07 MED ORDER — ACETAMINOPHEN 650 MG RE SUPP: 650.0000 mg | Freq: Four times a day (QID) | RECTAL | Status: DC | PRN

## 2016-06-07 MED ORDER — IPRATROPIUM-ALBUTEROL 0.5-2.5 (3) MG/3ML IN SOLN
3.0000 mL | Freq: Three times a day (TID) | RESPIRATORY_TRACT | Status: DC
  Administered 2016-06-07 – 2016-06-08 (×2): 3 mL via RESPIRATORY_TRACT
  Filled 2016-06-07 (×2): qty 3

## 2016-06-07 MED ORDER — ONDANSETRON HCL 4 MG PO TABS: 4.0000 mg | ORAL_TABLET | Freq: Four times a day (QID) | ORAL | Status: DC | PRN

## 2016-06-07 MED ORDER — BENZONATATE 100 MG PO CAPS: 100.0000 mg | ORAL_CAPSULE | Freq: Three times a day (TID) | ORAL | 0 refills | Status: DC

## 2016-06-07 MED ORDER — LABETALOL HCL 300 MG PO TABS
600.0000 mg | ORAL_TABLET | Freq: Two times a day (BID) | ORAL | Status: DC
  Administered 2016-06-07 (×2): 600 mg via ORAL
  Filled 2016-06-07 (×2): qty 2

## 2016-06-07 NOTE — ED Notes (Signed)
Urinal left at bedside. 

## 2016-06-07 NOTE — Progress Notes (Signed)
Called Er RN for report. She will call back.

## 2016-06-07 NOTE — Telephone Encounter (Signed)
He must sign [employer] application to be approved for Hep C med.  There is no voice mail to leave a message.

## 2016-06-07 NOTE — Progress Notes (Signed)
New Admission Note:  Arrival Method: Stretcher Mental Orientation: Alert and oriented x 4 Telemetry: Box 28 NSR Assessment: Completed Skin: Warm and dry  IV: NSL Pain: Denies  Tubes: N/A Safety Measures: Safety Fall Prevention Plan was given, discussed and signed. Admission: Completed [address] Orientation: Patient has been orientated to the room, unit and the staff. Family: None  Orders have been reviewed and implemented. Will continue to monitor the patient. Call light has been placed within reach and bed alarm has been activated.   Sima Matas BSN, RN  Phone Number: [phone]

## 2016-06-07 NOTE — Progress Notes (Signed)
Patient seen and examined   55 y.o. male with COPD, OSA, hypertension, chronic kidney disease started developing shortness of breath and wheezing last night with vomiting and diarrhea. Had 4 episodes of vomiting and diarrhea. Denies any abdominal pain. Patient states he was wheezing and was given nebulizer treatment in the ER. Patient was found to be easily desaturating on exertion while in the ER. Has not had any further episodes of diarrhea after admission  Assessment and plan 1. COPD exacerbation - patient's shortness of breath has improved .[name] Kitchen Will continue with duonebs and Pulmicort. If continues to wheeze may need prednisone. Chest x-ray shows stable cardiomegaly and pulmonary interstitial changes. Will add azithromycin 2. Nausea vomiting and diarrhea - probably from gastroenteritis. Check stool studies if patient has further diarrhea. 3. Hypertension uncontrolled - patient on labetalol which will be continued. Patient was also placed on when necessary IV hydralazine. Closely follow blood pressure trends. 4. 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 Creatinine appears to be at baseline of 3. Closely follow intake output. 5. History of hepatitis C - patient is being followed by infectious disease. 6. OSA - patient states he does not use CPAP. 7. Lovenox for DVT prophylaxis

## 2016-06-07 NOTE — Discharge Instructions (Signed)
You have been seen today for a cough, nausea, vomiting, and diarrhea. Your imaging and lab tests showed no acute abnormalities. Your symptoms are consistent with a viral illness. Viruses do not require antibiotics. Treatment is symptomatic care. Drink plenty of fluids and get plenty of rest.  Tylenol for pain or fever. Zofran for nausea. Tessalon for cough. Plain Mucinex may help relieve congestion. Warm liquids or Chloraseptic spray may help soothe the sore throat. Follow up with PCP as soon as possible to establish routine care. Return to ED should symptoms worsen.

## 2016-06-07 NOTE — H&P (Signed)
History and Physical    Johnny [name] [id number] DOB: 1961-06-01 DOA: 06/06/2016  PCP: PROVIDER NOT IN SYSTEM  Patient coming from: Home.  Chief Complaint: Shortness of breath, vomiting and diarrhea.  HPI: Johnny [name] is a 55 y.o. male with COPD, OSA, hypertension, chronic kidney disease started developing shortness of breath and wheezing last night with vomiting and diarrhea. Had 4 episodes of vomiting and diarrhea. Denies any abdominal pain. Patient states he was wheezing and was given nebulizer treatment in the ER. Patient was found to be easily desaturating on exertion while in the ER. Has not had any further episodes of diarrhea after admission. Abdomen appears benign. Denies any chest pain or productive cough. Patient admitted for acute bronchitis and further management. Denies use of any recent antibiotics or sick contacts. Denies any recent travel.  ED Course: Was given nebulizer treatment.  Review of Systems: As per HPI, rest all negative.   Past Medical History:  Diagnosis Date  . Chronic kidney disease   . COPD (chronic obstructive pulmonary disease) ([place])   . Hepatitis C   . Hypertension   . OSA (obstructive sleep apnea) 11/03/2015    Past Surgical History:  Procedure Laterality Date  . AV FISTULA PLACEMENT  09/12/2009   Left arm AVF     reports that he quit smoking about 5 years ago. His smoking use included Cigarettes. He smoked 0.10 packs per day. He has never used smokeless tobacco. He reports that he does not drink alcohol or use drugs.  Allergies  Allergen Reactions  . Heparin     Pt reports he's not sure what type of reaction  . Naproxen Other (See Comments)    Unknown    Family History  Problem Relation Age of Onset  . Hypertension Mother   . Diabetes Mother   . Colon cancer Neg Hx     Prior to Admission medications   Medication Sig Start Date End Date Taking? Authorizing Provider  albuterol (PROVENTIL HFA;VENTOLIN HFA) 108 (90 Base) MCG/ACT  inhaler Inhale 2 puffs into the lungs every 6 (six) hours as needed for wheezing or shortness of breath. Reported on 12/09/2015 06/01/16  Yes Orpah Greek, MD  colchicine 0.6 MG tablet Take 1 tablet (0.6 mg total) by mouth 2 (two) times daily. 07/02/15  Yes Billy Fischer, MD  labetalol (NORMODYNE) 200 MG tablet Take 3 tablets (600 mg total) by mouth 2 (two) times daily. 06/01/16  Yes Orpah Greek, MD  traMADol (ULTRAM) 50 MG tablet Take 1 tablet (50 mg total) by mouth every 6 (six) hours as needed. Patient taking differently: Take 50 mg by mouth every 6 (six) hours as needed for moderate pain.  04/04/15  Yes Gregor Hams, MD  albuterol (PROVENTIL HFA;VENTOLIN HFA) 108 (90 Base) MCG/ACT inhaler Inhale 2 puffs into the lungs every 4 (four) hours as needed for wheezing or shortness of breath. 06/07/16   Shawn C Joy, PA-C  benzonatate (TESSALON) 100 MG capsule Take 1 capsule (100 mg total) by mouth every 8 (eight) hours. 06/07/16   Shawn C Joy, PA-C  Elbasvir-Grazoprevir (ZEPATIER) 50-100 MG TABS Take 1 tablet by mouth daily. Patient not taking: Reported on 06/07/2016 06/04/16   Thayer Headings, MD  ondansetron (ZOFRAN ODT) 4 MG disintegrating tablet Take 1 tablet (4 mg total) by mouth every 8 (eight) hours as needed for nausea or vomiting. 06/07/16   Lorayne Bender, PA-C    Physical Exam: Vitals:   06/07/16 0215 06/07/16  0245 06/07/16 0300 06/07/16 0339  BP: 166/100 (!) 160/103 169/96 (!) 179/99  Pulse: 73 76 73 77  Resp: (!) 35 22 (!) 30 (!) 28  Temp:    98.5 F (36.9 C)  TempSrc:    Oral  SpO2: 94% 95% 90% 99%  Weight:    201 lb 4.5 oz (91.3 kg)  Height:    6\' 1"  (1.854 m)      Constitutional: Not in distress. Vitals:   06/07/16 0215 06/07/16 0245 06/07/16 0300 06/07/16 0339  BP: 166/100 (!) 160/103 169/96 (!) 179/99  Pulse: 73 76 73 77  Resp: (!) 35 22 (!) 30 (!) 28  Temp:    98.5 F (36.9 C)  TempSrc:    Oral  SpO2: 94% 95% 90% 99%  Weight:    201 lb 4.5 oz (91.3 kg)  Height:     6\' 1"  (1.854 m)   Eyes: Anicteric. No pallor. ENMT: No discharge from the ears eyes nose and mouth. Neck: No mass felt. No JVD appreciated. Respiratory: Mild expiratory wheeze or no crepitations. Cardiovascular: S1-S2 heard. Abdomen: Soft nontender bowel sounds present. Musculoskeletal: No edema. Skin: No rash. Neurologic: Alert awake oriented to time place and person. Moves all extremities. Psychiatric: Appears normal.   Labs on Admission: I have personally reviewed following labs and imaging studies  CBC:  Recent Labs Lab 06/06/16 2002  WBC 9.7  HGB 14.5  HCT 41.7  MCV 80.0  PLT [id number]   Basic Metabolic Panel:  Recent Labs Lab 06/06/16 2002  NA 136  K 3.7  CL 106  CO2 24  GLUCOSE 112*  BUN 20  CREATININE 2.22*  CALCIUM 9.0   GFR: Estimated Creatinine Clearance: 43.5 mL/min (by C-G formula based on SCr of 2.22 mg/dL). Liver Function Tests:  Recent Labs Lab 06/06/16 2002  AST 37  ALT 39  ALKPHOS 87  BILITOT 1.4*  PROT 7.2  ALBUMIN 3.2*    Recent Labs Lab 06/06/16 2002  LIPASE 21   No results for input(s): AMMONIA in the last 168 hours. Coagulation Profile: No results for input(s): INR, PROTIME in the last 168 hours. Cardiac Enzymes: No results for input(s): CKTOTAL, CKMB, CKMBINDEX, TROPONINI in the last 168 hours. BNP (last 3 results) No results for input(s): PROBNP in the last 8760 hours. HbA1C: No results for input(s): HGBA1C in the last 72 hours. CBG: No results for input(s): GLUCAP in the last 168 hours. Lipid Profile: No results for input(s): CHOL, HDL, LDLCALC, TRIG, CHOLHDL, LDLDIRECT in the last 72 hours. Thyroid Function Tests: No results for input(s): TSH, T4TOTAL, FREET4, T3FREE, THYROIDAB in the last 72 hours. Anemia Panel: No results for input(s): VITAMINB12, FOLATE, FERRITIN, TIBC, IRON, RETICCTPCT in the last 72 hours. Urine analysis:    Component Value Date/Time   COLORURINE AMBER (A) 06/07/2016 0315   APPEARANCEUR CLEAR  06/07/2016 0315   LABSPEC 1.026 06/07/2016 0315   PHURINE 6.0 06/07/2016 0315   GLUCOSEU NEGATIVE 06/07/2016 0315   HGBUR SMALL (A) 06/07/2016 0315   BILIRUBINUR SMALL (A) 06/07/2016 0315   KETONESUR NEGATIVE 06/07/2016 0315   PROTEINUR >300 (A) 06/07/2016 0315   UROBILINOGEN 1.0 02/21/2009 1636   NITRITE NEGATIVE 06/07/2016 0315   LEUKOCYTESUR NEGATIVE 06/07/2016 0315   Sepsis Labs: @LABRCNTIP (procalcitonin:4,lacticidven:4) )No results found for this or any previous visit (from the past 240 hour(s)).   Radiological Exams on Admission: Dg Chest 2 View  Result Date: 06/06/2016 CLINICAL DATA:  55 year old male with shortness of breath and cough today. Initial encounter.  Former smoker. EXAM: CHEST  2 VIEW COMPARISON:  07/22/2011. FINDINGS: Stable cardiomegaly and mediastinal contours. Stable lung volumes since 2012 at the upper limits of normal. Visualized tracheal air column is within normal limits. No pneumothorax, pleural effusion or consolidation. Chronic increased interstitial markings appear stable. Previously-seen patchy right upper lobe opacity has resolved. No acute or confluent pulmonary opacity identified. No acute osseous abnormality identified. IMPRESSION: Stable cardiomegaly and pulmonary interstitial changes. No acute cardiopulmonary abnormality. Electronically Signed   By: Genevie Ann M.D.   On: 06/06/2016 23:45    EKG: Independently reviewed. Normal sinus rhythm.  Assessment/Plan Principal Problem:   COPD exacerbation (HCC) Active Problems:   CKD (chronic kidney disease) stage 3, GFR 30-59 ml/min   Hypertension, uncontrolled   Nausea vomiting and diarrhea   Acute bronchitis    1. COPD exacerbation - patient's shortness of breath has improved and on my exam is not in distress. Will continue with duonebs and Pulmicort. If continues to wheeze may need prednisone. 2. Nausea vomiting and diarrhea - probably from gastroenteritis. Check stool studies if patient has further  diarrhea. 3. Hypertension uncontrolled - patient on labetalol which will be continued. Patient was also placed on when necessary IV hydralazine. Closely follow blood pressure trends. 4. 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 Creatinine appears to be at baseline. Closely follow intake output. 5. History of hepatitis C - patient is being followed by infectious disease. 6. OSA - patient states he does not use CPAP.   DVT prophylaxis: Patient has history of HIT. Patient is on SCDs. Code Status: Full code.  Family Communication: No family at the bedside.  Disposition Plan: Home.  Consults called: None.  Admission status: Observation. Telemetry.    Rise Patience MD [facility] Pager [phone].  If 7PM-7AM, please contact night-coverage [email or web address] Password [facility]  06/07/2016, 5:36 AM

## 2016-06-07 NOTE — ED Notes (Signed)
Walked pt his O2 dropped to 78% when back in the room resting went back up to 94%. Pt stated while walking that he was not SOB and felt better then he did before.

## 2016-06-08 DIAGNOSIS — N183 Chronic kidney disease, stage 3 (moderate): Secondary | ICD-10-CM | POA: Diagnosis not present

## 2016-06-08 DIAGNOSIS — I10 Essential (primary) hypertension: Secondary | ICD-10-CM | POA: Diagnosis not present

## 2016-06-08 DIAGNOSIS — J441 Chronic obstructive pulmonary disease with (acute) exacerbation: Secondary | ICD-10-CM | POA: Diagnosis not present

## 2016-06-08 MED ORDER — METHYLPREDNISOLONE SODIUM SUCC 125 MG IJ SOLR
60.0000 mg | Freq: Once | INTRAMUSCULAR | Status: AC
  Administered 2016-06-08: 60 mg via INTRAVENOUS
  Filled 2016-06-08: qty 2

## 2016-06-08 MED ORDER — PREDNISONE 20 MG PO TABS: 40.0000 mg | ORAL_TABLET | Freq: Every day | ORAL | 0 refills | Status: AC

## 2016-06-08 MED ORDER — LABETALOL HCL 200 MG PO TABS: 600.0000 mg | ORAL_TABLET | Freq: Three times a day (TID) | ORAL | 4 refills | Status: DC

## 2016-06-08 MED ORDER — AMLODIPINE BESYLATE 10 MG PO TABS: 10.0000 mg | ORAL_TABLET | Freq: Every day | ORAL | 2 refills | Status: DC

## 2016-06-08 MED ORDER — MOMETASONE FURO-FORMOTEROL FUM 200-5 MCG/ACT IN AERO: 2.0000 | INHALATION_SPRAY | Freq: Two times a day (BID) | RESPIRATORY_TRACT | 5 refills | Status: DC

## 2016-06-08 MED ORDER — AZITHROMYCIN 500 MG PO TABS: 500.0000 mg | ORAL_TABLET | Freq: Every day | ORAL | 0 refills | Status: DC

## 2016-06-08 MED ORDER — PREDNISONE 20 MG PO TABS: 40.0000 mg | ORAL_TABLET | Freq: Every day | ORAL | 0 refills | Status: DC

## 2016-06-08 MED ORDER — ALBUTEROL SULFATE HFA 108 (90 BASE) MCG/ACT IN AERS: 2.0000 | INHALATION_SPRAY | RESPIRATORY_TRACT | 1 refills | Status: DC | PRN

## 2016-06-08 NOTE — Care Management Note (Signed)
Case Management Note  Patient Details  Name: Johnny [name] MRN: [id number] Date of Birth: October 08, 1961  Subjective/Objective:      CM following for progression and d/c planning.               Action/Plan: 06/08/2016 Pt ready for d/c noted multiple prescriptions for expensive meds and pt is uninsured. [place] letter given, await printed prescriptions. MD notified. [place] ordered and [facility] notified of pt selfpay status.   Expected Discharge Date:  06/08/2016              Expected Discharge Plan:  Home/Self Care  In-House Referral:  NA  Discharge planning Services  CM Consult, [place] [facility]  Post Acute Care Choice:  Durable Medical Equipment Choice offered to:  NA  DME Arranged:  [place]/meds DME Agency:  [place]:  NA [place] Agency:     Status of Service:  Completed, signed off  If discussed at [place] of Stay Meetings, dates discussed:    Additional Comments:  Adron Bene, RN 06/08/2016, 11:55 AM

## 2016-06-08 NOTE — Discharge Summary (Deleted)
Physician Discharge Summary  Johnny [name] MRN: [id number] DOB/AGE: [birth year] 53 y.o.  PCP: PROVIDER NOT IN SYSTEM   Admit date: 06/06/2016 Discharge date: 06/08/2016  Discharge Diagnoses:    Principal Problem:   COPD exacerbation ([place]) Active Problems:   CKD (chronic kidney disease) stage 3, GFR 30-59 ml/min   Hypertension, uncontrolled   Nausea vomiting and diarrhea   Acute bronchitis    Follow-up recommendations Follow-up with PCP in 3-5 days , including all  additional recommended appointments as below Follow-up CBC, CMP in 3-5 days      Current Discharge Medication List    START taking these medications   Details  !! albuterol (PROVENTIL HFA;VENTOLIN HFA) 108 (90 Base) MCG/ACT inhaler Inhale 2 puffs into the lungs every 4 (four) hours as needed for wheezing or shortness of breath. Qty: 1 Inhaler, Refills: 1    amLODipine (NORVASC) 10 MG tablet Take 1 tablet (10 mg total) by mouth daily. Qty: 30 tablet, Refills: 2    azithromycin (ZITHROMAX) 500 MG tablet Take 1 tablet (500 mg total) by mouth daily. Qty: 5 tablet, Refills: 0    benzonatate (TESSALON) 100 MG capsule Take 1 capsule (100 mg total) by mouth every 8 (eight) hours. Qty: 21 capsule, Refills: 0    mometasone-formoterol (DULERA) 200-5 MCG/ACT AERO Inhale 2 puffs into the lungs 2 (two) times daily. Qty: 13 g, Refills: 5    ondansetron (ZOFRAN ODT) 4 MG disintegrating tablet Take 1 tablet (4 mg total) by mouth every 8 (eight) hours as needed for nausea or vomiting. Qty: 20 tablet, Refills: 0    predniSONE (DELTASONE) 20 MG tablet Take 2 tablets (40 mg total) by mouth daily with breakfast. Qty: 10 tablet, Refills: 0     !! - Potential duplicate medications found. Please discuss with provider.    CONTINUE these medications which have CHANGED   Details  labetalol (NORMODYNE) 200 MG tablet Take 3 tablets (600 mg total) by mouth 3 (three) times daily. Qty: 180 tablet, Refills: 4      CONTINUE these  medications which have NOT CHANGED   Details  !! albuterol (PROVENTIL HFA;VENTOLIN HFA) 108 (90 Base) MCG/ACT inhaler Inhale 2 puffs into the lungs every 6 (six) hours as needed for wheezing or shortness of breath. Reported on 12/09/2015 Qty: 1 Inhaler, Refills: 2    colchicine 0.6 MG tablet Take 1 tablet (0.6 mg total) by mouth 2 (two) times daily. Qty: 30 tablet, Refills: 1    traMADol (ULTRAM) 50 MG tablet Take 1 tablet (50 mg total) by mouth every 6 (six) hours as needed. Qty: 15 tablet, Refills: 0    Elbasvir-Grazoprevir (ZEPATIER) 50-100 MG TABS Take 1 tablet by mouth daily. Qty: 28 tablet, Refills: 2     !! - Potential duplicate medications found. Please discuss with provider.         Discharge Condition: Stable    Discharge Instructions Get Medicines reviewed and adjusted: Please take all your medications with you for your next visit with your Primary MD  Please request your Primary MD to go over all hospital tests and procedure/radiological results at the follow up, please ask your Primary MD to get all Hospital records sent to his/her office.  If you experience worsening of your admission symptoms, develop shortness of breath, life threatening emergency, suicidal or homicidal thoughts you must seek medical attention immediately by calling 911 or calling your MD immediately if symptoms less severe.  You must read complete instructions/literature along with all the possible adverse  reactions/side effects for all the Medicines you take and that have been prescribed to you. Take any new Medicines after you have completely understood and accpet all the possible adverse reactions/side effects.   Do not drive when taking Pain medications.   Do not take more than prescribed Pain, Sleep and Anxiety Medications  Special Instructions: If you have smoked or chewed Tobacco in the last 2 yrs please stop smoking, stop any regular Alcohol and or any Recreational drug use.  Wear  Seat belts while driving.  Please note  You were cared for by a hospitalist during your hospital stay. Once you are discharged, your primary care physician will handle any further medical issues. Please note that NO REFILLS for any discharge medications will be authorized once you are discharged, as it is imperative that you return to your primary care physician (or establish a relationship with a primary care physician if you do not have one) for your aftercare needs so that they can reassess your need for medications and monitor your lab values.     Allergies  Allergen Reactions  . Naproxen Other (See Comments)    Unknown      Disposition: 01-Home or Self Care   Consults:  None     Significant Diagnostic Studies:  Dg Chest 2 View  Result Date: 06/06/2016 CLINICAL DATA:  55 year old male with shortness of breath and cough today. Initial encounter. Former smoker. EXAM: CHEST  2 VIEW COMPARISON:  07/22/2011. FINDINGS: Stable cardiomegaly and mediastinal contours. Stable lung volumes since 2012 at the upper limits of normal. Visualized tracheal air column is within normal limits. No pneumothorax, pleural effusion or consolidation. Chronic increased interstitial markings appear stable. Previously-seen patchy right upper lobe opacity has resolved. No acute or confluent pulmonary opacity identified. No acute osseous abnormality identified. IMPRESSION: Stable cardiomegaly and pulmonary interstitial changes. No acute cardiopulmonary abnormality. Electronically Signed   By: Genevie Ann M.D.   On: 06/06/2016 23:45        Filed Weights   06/07/16 0339  Weight: 91.3 kg (201 lb 4.5 oz)     Microbiology: Recent Results (from the past 240 hour(s))  MRSA PCR Screening     Status: None   Collection Time: 06/07/16  3:46 AM  Result Value Ref Range Status   MRSA by PCR NEGATIVE NEGATIVE Final    Comment:        The GeneXpert MRSA Assay (FDA approved for NASAL specimens only), is one  component of a comprehensive MRSA colonization surveillance program. It is not intended to diagnose MRSA infection nor to guide or monitor treatment for MRSA infections.        Blood Culture    Component Value Date/Time   SDES THROAT 07/12/2013 1613   SPECREQUEST NONE 07/12/2013 1613   CULT  07/12/2013 1613    GROUP A STREP (S.PYOGENES) ISOLATED Performed at [place] 07/14/2013 FINAL 07/12/2013 1613      Labs: Results for orders placed or performed during the hospital encounter of 06/06/16 (from the past 48 hour(s))  Lipase, blood     Status: None   Collection Time: 06/06/16  8:02 PM  Result Value Ref Range   Lipase 21 11 - 51 U/L  Comprehensive metabolic panel     Status: Abnormal   Collection Time: 06/06/16  8:02 PM  Result Value Ref Range   Sodium 136 135 - 145 mmol/L   Potassium 3.7 3.5 - 5.1 mmol/L   Chloride 106 101 - 111  mmol/L   CO2 24 22 - 32 mmol/L   Glucose, Bld 112 (H) 65 - 99 mg/dL   BUN 20 6 - 20 mg/dL   Creatinine, Ser 2.22 (H) 0.61 - 1.24 mg/dL   Calcium 9.0 8.9 - 10.3 mg/dL   Total Protein 7.2 6.5 - 8.1 g/dL   Albumin 3.2 (L) 3.5 - 5.0 g/dL   AST 37 15 - 41 U/L   ALT 39 17 - 63 U/L   Alkaline Phosphatase 87 38 - 126 U/L   Total Bilirubin 1.4 (H) 0.3 - 1.2 mg/dL   GFR calc non Af Amer 32 (L) >60 mL/min   GFR calc Af Amer 37 (L) >60 mL/min    Comment: (NOTE) The eGFR has been calculated using the CKD EPI equation. This calculation has not been validated in all clinical situations. eGFR's persistently <60 mL/min signify possible Chronic Kidney Disease.    Anion gap 6 5 - 15  CBC     Status: None   Collection Time: 06/06/16  8:02 PM  Result Value Ref Range   WBC 9.7 4.0 - 10.5 K/uL   RBC 5.21 4.22 - 5.81 MIL/uL   Hemoglobin 14.5 13.0 - 17.0 g/dL   HCT 41.7 39.0 - 52.0 %   MCV 80.0 78.0 - 100.0 fL   MCH 27.8 26.0 - 34.0 pg   MCHC 34.8 30.0 - 36.0 g/dL   RDW 14.2 11.5 - 15.5 %   Platelets 163 150 - 400 K/uL   Urinalysis, Routine w reflex microscopic     Status: Abnormal   Collection Time: 06/07/16  3:15 AM  Result Value Ref Range   Color, Urine AMBER (A) YELLOW    Comment: BIOCHEMICALS MAY BE AFFECTED BY COLOR   APPearance CLEAR CLEAR   Specific Gravity, Urine 1.026 1.005 - 1.030   pH 6.0 5.0 - 8.0   Glucose, UA NEGATIVE NEGATIVE mg/dL   Hgb urine dipstick SMALL (A) NEGATIVE   Bilirubin Urine SMALL (A) NEGATIVE   Ketones, ur NEGATIVE NEGATIVE mg/dL   Protein, ur >300 (A) NEGATIVE mg/dL   Nitrite NEGATIVE NEGATIVE   Leukocytes, UA NEGATIVE NEGATIVE  Urine microscopic-add on     Status: Abnormal   Collection Time: 06/07/16  3:15 AM  Result Value Ref Range   Squamous Epithelial / LPF 0-5 (A) NONE SEEN   WBC, UA NONE SEEN 0 - 5 WBC/hpf   RBC / HPF 0-5 0 - 5 RBC/hpf   Bacteria, UA RARE (A) NONE SEEN   Casts GRANULAR CAST (A) NEGATIVE  MRSA PCR Screening     Status: None   Collection Time: 06/07/16  3:46 AM  Result Value Ref Range   MRSA by PCR NEGATIVE NEGATIVE    Comment:        The GeneXpert MRSA Assay (FDA approved for NASAL specimens only), is one component of a comprehensive MRSA colonization surveillance program. It is not intended to diagnose MRSA infection nor to guide or monitor treatment for MRSA infections.   Comprehensive metabolic panel     Status: Abnormal   Collection Time: 06/07/16  7:18 AM  Result Value Ref Range   Sodium 134 (L) 135 - 145 mmol/L   Potassium 3.6 3.5 - 5.1 mmol/L   Chloride 100 (L) 101 - 111 mmol/L   CO2 24 22 - 32 mmol/L   Glucose, Bld 109 (H) 65 - 99 mg/dL   BUN 18 6 - 20 mg/dL   Creatinine, Ser 2.12 (H) 0.61 - 1.24 mg/dL  Calcium 9.2 8.9 - 10.3 mg/dL   Total Protein 7.3 6.5 - 8.1 g/dL   Albumin 3.2 (L) 3.5 - 5.0 g/dL   AST 35 15 - 41 U/L   ALT 36 17 - 63 U/L   Alkaline Phosphatase 90 38 - 126 U/L   Total Bilirubin 1.7 (H) 0.3 - 1.2 mg/dL   GFR calc non Af Amer 34 (L) >60 mL/min   GFR calc Af Amer 39 (L) >60 mL/min    Comment:  (NOTE) The eGFR has been calculated using the CKD EPI equation. This calculation has not been validated in all clinical situations. eGFR's persistently <60 mL/min signify possible Chronic Kidney Disease.    Anion gap 10 5 - 15  CBC with Differential/Platelet     Status: Abnormal   Collection Time: 06/07/16  7:18 AM  Result Value Ref Range   WBC 11.0 (H) 4.0 - 10.5 K/uL   RBC 5.34 4.22 - 5.81 MIL/uL   Hemoglobin 15.0 13.0 - 17.0 g/dL   HCT 42.9 39.0 - 52.0 %   MCV 80.3 78.0 - 100.0 fL   MCH 28.1 26.0 - 34.0 pg   MCHC 35.0 30.0 - 36.0 g/dL   RDW 14.4 11.5 - 15.5 %   Platelets 157 150 - 400 K/uL   Neutrophils Relative % 69 %   Neutro Abs 7.6 1.7 - 7.7 K/uL   Lymphocytes Relative 20 %   Lymphs Abs 2.2 0.7 - 4.0 K/uL   Monocytes Relative 9 %   Monocytes Absolute 1.0 0.1 - 1.0 K/uL   Eosinophils Relative 2 %   Eosinophils Absolute 0.2 0.0 - 0.7 K/uL   Basophils Relative 0 %   Basophils Absolute 0.0 0.0 - 0.1 K/uL     Lipid Panel     Component Value Date/Time   CHOL  02/22/2009 0635    158        ATP III CLASSIFICATION:  <200     mg/dL   Desirable  200-239  mg/dL   Borderline High  >=240    mg/dL   High          TRIG 82 02/22/2009 0635   HDL 43 02/22/2009 0635   CHOLHDL 3.7 02/22/2009 0635   VLDL 16 02/22/2009 0635   LDLCALC  02/22/2009 0635    99        Total Cholesterol/HDL:CHD Risk Coronary Heart Disease Risk Table                     Men   Women  1/2 Average Risk   3.4   3.3  Average Risk       5.0   4.4  2 X Average Risk   9.6   7.1  3 X Average Risk  23.4   11.0        Use the calculated Patient Ratio above and the CHD Risk Table to determine the patient's CHD Risk.        ATP III CLASSIFICATION (LDL):  <100     mg/dL   Optimal  100-129  mg/dL   Near or Above                    Optimal  130-159  mg/dL   Borderline  160-189  mg/dL   High  >190     mg/dL   Very High     Lab Results  Component Value Date   HGBA1C  02/21/2009    5.3 (NOTE) The  ADA  recommends the following therapeutic goal for glycemic control related to Hgb A1c measurement: Goal of therapy: <6.5 Hgb A1c  Reference: American Diabetes Association: Clinical Practice Recommendations 2010, Diabetes Care, 2010, 33: (Suppl  1).        HPI :    55 y.o. male with COPD, OSA, hypertension, chronic kidney disease started developing shortness of breath and wheezing last night with vomiting and diarrhea. Had 4 episodes of vomiting and diarrhea. Denies any abdominal pain. Patient states he was wheezing and was given nebulizer treatment in the ER. Patient was found to be easily desaturating on exertion while in the ER. Has not had any further episodes of diarrhea after admission. Abdomen appears benign. Denies any chest pain or productive cough. Patient admitted for acute bronchitis and further management. Denies use of any recent antibiotics or sick contacts. Denies any recent travel.  HOSPITAL COURSE:  1. COPD exacerbation- patient's shortness of breath has improved .[name] Kitchen Patient started on duonebs and Pulmicort. Solu-Medrol IV 2, subsequently changed to prednisone 40 mg a day 5 days. . Chest x-ray shows stable cardiomegaly and pulmonary interstitial changes. Continue azithromycin 500 mg 5 more days 2. Nausea vomiting and diarrhea- probably from gastroenteritis. Patient did not have any further diarrhea in the hospital 3. Hypertension uncontrolled- dose of labetalol increased, also started patient on Norvasc for better blood pressure control 4. 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 Creatinine appears to be better than baseline of 3. Closely follow intake output. 5. History of hepatitis C- patient is being followed by infectious disease. 6. OSA - patient states he does not use CPAP.   Discharge Exam:   Blood pressure (!) 153/89, pulse 69, temperature 97.9 F (36.6 C), temperature source Oral, resp. rate 19, height 6' 1"  (1.854 m), weight 91.3 kg (201 lb 4.5  oz), SpO2 99 %.  Eyes: Anicteric. No pallor. ENMT: No discharge from the ears eyes nose and mouth. Neck: No mass felt. No JVD appreciated. Respiratory: Mild expiratory wheeze or no crepitations. Cardiovascular: S1-S2 heard. Abdomen: Soft nontender bowel sounds present. Musculoskeletal: No edema. Skin: No rash. Neurologic: Alert awake oriented to time place and person. Moves all extremities. Psychiatric: Appears normal.    Follow-up Information    [facility].   Why:  As soon as possible for follow up, For chronic management of this issue Contact information: [place] [id number] [phone]       [facility] EMERGENCY DEPARTMENT.   Specialty:  Emergency Medicine Why:  As needed, If symptoms worsen Contact information: [address] [id number] [place] [phone]          Signed: Reyne Dumas 06/08/2016, 8:15 AM        Time spent >45 mins

## 2016-06-08 NOTE — Progress Notes (Signed)
Patient discharged to home. IV removed. Telemetry removed. All signed scripts given and reviewed. Match letter given to patient. Nebulizer for home use given to patient. Patient to follow up with PCP within one week. All belongings with patient. Patient understands all discharge instructions.

## 2016-06-08 NOTE — Discharge Summary (Signed)
Physician Discharge Summary  Johnny [name] MRN: [id number] DOB/AGE: [id number] y.o.  PCP: PROVIDER NOT IN SYSTEM   Admit date: 06/06/2016 Discharge date: 06/08/2016  Discharge Diagnoses:    Principal Problem:   COPD exacerbation ([place]) Active Problems:   CKD (chronic kidney disease) stage 3, GFR 30-59 ml/min   Hypertension, uncontrolled   Nausea vomiting and diarrhea   Acute bronchitis    Follow-up recommendations Follow-up with PCP in 3-5 days , including all  additional recommended appointments as below Follow-up CBC, CMP in 3-5 days      Current Discharge Medication List    START taking these medications   Details  !! albuterol (PROVENTIL HFA;VENTOLIN HFA) 108 (90 Base) MCG/ACT inhaler Inhale 2 puffs into the lungs every 4 (four) hours as needed for wheezing or shortness of breath. Qty: 1 Inhaler, Refills: 1    amLODipine (NORVASC) 10 MG tablet Take 1 tablet (10 mg total) by mouth daily. Qty: 30 tablet, Refills: 2    azithromycin (ZITHROMAX) 500 MG tablet Take 1 tablet (500 mg total) by mouth daily. Qty: 5 tablet, Refills: 0    benzonatate (TESSALON) 100 MG capsule Take 1 capsule (100 mg total) by mouth every 8 (eight) hours. Qty: 21 capsule, Refills: 0    mometasone-formoterol (DULERA) 200-5 MCG/ACT AERO Inhale 2 puffs into the lungs 2 (two) times daily. Qty: 13 g, Refills: 5    ondansetron (ZOFRAN ODT) 4 MG disintegrating tablet Take 1 tablet (4 mg total) by mouth every 8 (eight) hours as needed for nausea or vomiting. Qty: 20 tablet, Refills: 0    predniSONE (DELTASONE) 20 MG tablet Take 2 tablets (40 mg total) by mouth daily with breakfast. Qty: 10 tablet, Refills: 0     !! - Potential duplicate medications found. Please discuss with provider.    CONTINUE these medications which have CHANGED   Details  labetalol (NORMODYNE) 200 MG tablet Take 3 tablets (600 mg total) by mouth 3 (three) times daily. Qty: 180 tablet, Refills: 4      CONTINUE these  medications which have NOT CHANGED   Details  !! albuterol (PROVENTIL HFA;VENTOLIN HFA) 108 (90 Base) MCG/ACT inhaler Inhale 2 puffs into the lungs every 6 (six) hours as needed for wheezing or shortness of breath. Reported on 12/09/2015 Qty: 1 Inhaler, Refills: 2    colchicine 0.6 MG tablet Take 1 tablet (0.6 mg total) by mouth 2 (two) times daily. Qty: 30 tablet, Refills: 1    traMADol (ULTRAM) 50 MG tablet Take 1 tablet (50 mg total) by mouth every 6 (six) hours as needed. Qty: 15 tablet, Refills: 0    Elbasvir-Grazoprevir (ZEPATIER) 50-100 MG TABS Take 1 tablet by mouth daily. Qty: 28 tablet, Refills: 2     !! - Potential duplicate medications found. Please discuss with provider.         Discharge Condition: Stable    Discharge Instructions Get Medicines reviewed and adjusted: Please take all your medications with you for your next visit with your Primary MD  Please request your Primary MD to go over all hospital tests and procedure/radiological results at the follow up, please ask your Primary MD to get all Hospital records sent to his/her office.  If you experience worsening of your admission symptoms, develop shortness of breath, life threatening emergency, suicidal or homicidal thoughts you must seek medical attention immediately by calling 911 or calling your MD immediately if symptoms less severe.  You must read complete instructions/literature along with all the possible adverse  reactions/side effects for all the Medicines you take and that have been prescribed to you. Take any new Medicines after you have completely understood and accpet all the possible adverse reactions/side effects.   Do not drive when taking Pain medications.   Do not take more than prescribed Pain, Sleep and Anxiety Medications  Special Instructions: If you have smoked or chewed Tobacco in the last 2 yrs please stop smoking, stop any regular Alcohol and or any Recreational drug use.  Wear  Seat belts while driving.  Please note  You were cared for by a hospitalist during your hospital stay. Once you are discharged, your primary care physician will handle any further medical issues. Please note that NO REFILLS for any discharge medications will be authorized once you are discharged, as it is imperative that you return to your primary care physician (or establish a relationship with a primary care physician if you do not have one) for your aftercare needs so that they can reassess your need for medications and monitor your lab values.  Discharge Instructions    Diet - low sodium heart healthy    Complete by:  As directed   Diet - low sodium heart healthy    Complete by:  As directed   Increase activity slowly    Complete by:  As directed   Increase activity slowly    Complete by:  As directed       Allergies  Allergen Reactions  . Naproxen Other (See Comments)    Unknown      Disposition: 01-Home or Self Care   Consults:  None     Significant Diagnostic Studies:  Dg Chest 2 View  Result Date: 06/06/2016 CLINICAL DATA:  55 year old male with shortness of breath and cough today. Initial encounter. Former smoker. EXAM: CHEST  2 VIEW COMPARISON:  07/22/2011. FINDINGS: Stable cardiomegaly and mediastinal contours. Stable lung volumes since 2012 at the upper limits of normal. Visualized tracheal air column is within normal limits. No pneumothorax, pleural effusion or consolidation. Chronic increased interstitial markings appear stable. Previously-seen patchy right upper lobe opacity has resolved. No acute or confluent pulmonary opacity identified. No acute osseous abnormality identified. IMPRESSION: Stable cardiomegaly and pulmonary interstitial changes. No acute cardiopulmonary abnormality. Electronically Signed   By: Genevie Ann M.D.   On: 06/06/2016 23:45        Filed Weights   06/07/16 0339  Weight: 91.3 kg (201 lb 4.5 oz)     Microbiology: Recent Results (from  the past 240 hour(s))  MRSA PCR Screening     Status: None   Collection Time: 06/07/16  3:46 AM  Result Value Ref Range Status   MRSA by PCR NEGATIVE NEGATIVE Final    Comment:        The GeneXpert MRSA Assay (FDA approved for NASAL specimens only), is one component of a comprehensive MRSA colonization surveillance program. It is not intended to diagnose MRSA infection nor to guide or monitor treatment for MRSA infections.        Blood Culture    Component Value Date/Time   SDES THROAT 07/12/2013 1613   SPECREQUEST NONE 07/12/2013 1613   CULT  07/12/2013 1613    GROUP A STREP (S.PYOGENES) ISOLATED Performed at [facility] 07/14/2013 FINAL 07/12/2013 1613      Labs: Results for orders placed or performed during the hospital encounter of 06/06/16 (from the past 48 hour(s))  Lipase, blood     Status: None   Collection  Time: 06/06/16  8:02 PM  Result Value Ref Range   Lipase 21 11 - 51 U/L  Comprehensive metabolic panel     Status: Abnormal   Collection Time: 06/06/16  8:02 PM  Result Value Ref Range   Sodium 136 135 - 145 mmol/L   Potassium 3.7 3.5 - 5.1 mmol/L   Chloride 106 101 - 111 mmol/L   CO2 24 22 - 32 mmol/L   Glucose, Bld 112 (H) 65 - 99 mg/dL   BUN 20 6 - 20 mg/dL   Creatinine, Ser 2.22 (H) 0.61 - 1.24 mg/dL   Calcium 9.0 8.9 - 10.3 mg/dL   Total Protein 7.2 6.5 - 8.1 g/dL   Albumin 3.2 (L) 3.5 - 5.0 g/dL   AST 37 15 - 41 U/L   ALT 39 17 - 63 U/L   Alkaline Phosphatase 87 38 - 126 U/L   Total Bilirubin 1.4 (H) 0.3 - 1.2 mg/dL   GFR calc non Af Amer 32 (L) >60 mL/min   GFR calc Af Amer 37 (L) >60 mL/min    Comment: (NOTE) The eGFR has been calculated using the CKD EPI equation. This calculation has not been validated in all clinical situations. eGFR's persistently <60 mL/min signify possible Chronic Kidney Disease.    Anion gap 6 5 - 15  CBC     Status: None   Collection Time: 06/06/16  8:02 PM  Result Value Ref Range    WBC 9.7 4.0 - 10.5 K/uL   RBC 5.21 4.22 - 5.81 MIL/uL   Hemoglobin 14.5 13.0 - 17.0 g/dL   HCT 41.7 39.0 - 52.0 %   MCV 80.0 78.0 - 100.0 fL   MCH 27.8 26.0 - 34.0 pg   MCHC 34.8 30.0 - 36.0 g/dL   RDW 14.2 11.5 - 15.5 %   Platelets 163 150 - 400 K/uL  Urinalysis, Routine w reflex microscopic     Status: Abnormal   Collection Time: 06/07/16  3:15 AM  Result Value Ref Range   Color, Urine AMBER (A) YELLOW    Comment: BIOCHEMICALS MAY BE AFFECTED BY COLOR   APPearance CLEAR CLEAR   Specific Gravity, Urine 1.026 1.005 - 1.030   pH 6.0 5.0 - 8.0   Glucose, UA NEGATIVE NEGATIVE mg/dL   Hgb urine dipstick SMALL (A) NEGATIVE   Bilirubin Urine SMALL (A) NEGATIVE   Ketones, ur NEGATIVE NEGATIVE mg/dL   Protein, ur >300 (A) NEGATIVE mg/dL   Nitrite NEGATIVE NEGATIVE   Leukocytes, UA NEGATIVE NEGATIVE  Urine microscopic-add on     Status: Abnormal   Collection Time: 06/07/16  3:15 AM  Result Value Ref Range   Squamous Epithelial / LPF 0-5 (A) NONE SEEN   WBC, UA NONE SEEN 0 - 5 WBC/hpf   RBC / HPF 0-5 0 - 5 RBC/hpf   Bacteria, UA RARE (A) NONE SEEN   Casts GRANULAR CAST (A) NEGATIVE  MRSA PCR Screening     Status: None   Collection Time: 06/07/16  3:46 AM  Result Value Ref Range   MRSA by PCR NEGATIVE NEGATIVE    Comment:        The GeneXpert MRSA Assay (FDA approved for NASAL specimens only), is one component of a comprehensive MRSA colonization surveillance program. It is not intended to diagnose MRSA infection nor to guide or monitor treatment for MRSA infections.   Comprehensive metabolic panel     Status: Abnormal   Collection Time: 06/07/16  7:18 AM  Result Value Ref  Range   Sodium 134 (L) 135 - 145 mmol/L   Potassium 3.6 3.5 - 5.1 mmol/L   Chloride 100 (L) 101 - 111 mmol/L   CO2 24 22 - 32 mmol/L   Glucose, Bld 109 (H) 65 - 99 mg/dL   BUN 18 6 - 20 mg/dL   Creatinine, Ser 2.12 (H) 0.61 - 1.24 mg/dL   Calcium 9.2 8.9 - 10.3 mg/dL   Total Protein 7.3 6.5 - 8.1 g/dL    Albumin 3.2 (L) 3.5 - 5.0 g/dL   AST 35 15 - 41 U/L   ALT 36 17 - 63 U/L   Alkaline Phosphatase 90 38 - 126 U/L   Total Bilirubin 1.7 (H) 0.3 - 1.2 mg/dL   GFR calc non Af Amer 34 (L) >60 mL/min   GFR calc Af Amer 39 (L) >60 mL/min    Comment: (NOTE) The eGFR has been calculated using the CKD EPI equation. This calculation has not been validated in all clinical situations. eGFR's persistently <60 mL/min signify possible Chronic Kidney Disease.    Anion gap 10 5 - 15  CBC with Differential/Platelet     Status: Abnormal   Collection Time: 06/07/16  7:18 AM  Result Value Ref Range   WBC 11.0 (H) 4.0 - 10.5 K/uL   RBC 5.34 4.22 - 5.81 MIL/uL   Hemoglobin 15.0 13.0 - 17.0 g/dL   HCT 42.9 39.0 - 52.0 %   MCV 80.3 78.0 - 100.0 fL   MCH 28.1 26.0 - 34.0 pg   MCHC 35.0 30.0 - 36.0 g/dL   RDW 14.4 11.5 - 15.5 %   Platelets 157 150 - 400 K/uL   Neutrophils Relative % 69 %   Neutro Abs 7.6 1.7 - 7.7 K/uL   Lymphocytes Relative 20 %   Lymphs Abs 2.2 0.7 - 4.0 K/uL   Monocytes Relative 9 %   Monocytes Absolute 1.0 0.1 - 1.0 K/uL   Eosinophils Relative 2 %   Eosinophils Absolute 0.2 0.0 - 0.7 K/uL   Basophils Relative 0 %   Basophils Absolute 0.0 0.0 - 0.1 K/uL     Lipid Panel     Component Value Date/Time   CHOL  02/22/2009 0635    158        ATP III CLASSIFICATION:  <200     mg/dL   Desirable  200-239  mg/dL   Borderline High  >=240    mg/dL   High          TRIG 82 02/22/2009 0635   HDL 43 02/22/2009 0635   CHOLHDL 3.7 02/22/2009 0635   VLDL 16 02/22/2009 0635   LDLCALC  02/22/2009 0635    99        Total Cholesterol/HDL:CHD Risk Coronary Heart Disease Risk Table                     Men   Women  1/2 Average Risk   3.4   3.3  Average Risk       5.0   4.4  2 X Average Risk   9.6   7.1  3 X Average Risk  23.4   11.0        Use the calculated Patient Ratio above and the CHD Risk Table to determine the patient's CHD Risk.        ATP III CLASSIFICATION (LDL):  <100      mg/dL   Optimal  100-129  mg/dL   Near or Above  Optimal  130-159  mg/dL   Borderline  160-189  mg/dL   High  >190     mg/dL   Very High     Lab Results  Component Value Date   HGBA1C  02/21/2009    5.3 (NOTE) The ADA recommends the following therapeutic goal for glycemic control related to Hgb A1c measurement: Goal of therapy: <6.5 Hgb A1c  Reference: American Diabetes Association: Clinical Practice Recommendations 2010, Diabetes Care, 2010, 33: (Suppl  1).        HPI :    55 y.o. male with COPD, OSA, hypertension, chronic kidney disease started developing shortness of breath and wheezing last night with vomiting and diarrhea. Had 4 episodes of vomiting and diarrhea. Denies any abdominal pain. Patient states he was wheezing and was given nebulizer treatment in the ER. Patient was found to be easily desaturating on exertion while in the ER. Has not had any further episodes of diarrhea after admission. Abdomen appears benign. Denies any chest pain or productive cough. Patient admitted for acute bronchitis and further management. Denies use of any recent antibiotics or sick contacts. Denies any recent travel.  HOSPITAL COURSE:  1. COPD exacerbation- patient's shortness of breath has improved .[name] Kitchen Patient started on duonebs and Pulmicort. Solu-Medrol IV 2, subsequently changed to prednisone 40 mg a day 5 days. . Chest x-ray shows stable cardiomegaly and pulmonary interstitial changes. Continue azithromycin 500 mg 5 more days 2. Nausea vomiting and diarrhea- probably from gastroenteritis. Patient did not have any further diarrhea in the hospital 3. Hypertension uncontrolled- dose of labetalol increased, also started patient on Norvasc for better blood pressure control 4. 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 Creatinine appears to be better than baseline of 3. Closely follow intake output. 5. History of hepatitis C- patient is being followed by  infectious disease. 6. OSA - patient states he does not use CPAP.   Discharge Exam:   Blood pressure (!) 151/96, pulse 72, temperature 98.4 F (36.9 C), temperature source Oral, resp. rate 18, height _0  (1.854 m), weight 91.3 kg (201 lb 4.5 oz), SpO2 97 %.  Eyes: Anicteric. No pallor. ENMT: No discharge from the ears eyes nose and mouth. Neck: No mass felt. No JVD appreciated. Respiratory: Mild expiratory wheeze or no crepitations. Cardiovascular: S1-S2 heard. Abdomen: Soft nontender bowel sounds present. Musculoskeletal: No edema. Skin: No rash. Neurologic: Alert awake oriented to time place and person. Moves all extremities. Psychiatric: Appears normal.    Follow-up Information    [place].   Why:  As soon as possible for follow up, For chronic management of this issue Contact information: [place] [phone]       [facility] EMERGENCY DEPARTMENT.   Specialty:  Emergency Medicine Why:  As needed, If symptoms worsen Contact information: [address] [id number] [place] [phone]          Signed: Reyne Dumas 06/08/2016, 12:02 PM        Time spent >45 mins

## 2016-06-22 ENCOUNTER — Telehealth: Payer: Self-pay | Admitting: *Deleted

## 2016-07-14 ENCOUNTER — Emergency Department (HOSPITAL_COMMUNITY)
Admission: EM | Admit: 2016-07-14 | Discharge: 2016-07-14 | Disposition: A | Discharge status: Home / Self Care | Payer: Medicaid Other | Attending: Emergency Medicine | Admitting: Emergency Medicine

## 2016-07-14 ENCOUNTER — Encounter (HOSPITAL_COMMUNITY): Payer: Self-pay | Admitting: *Deleted

## 2016-07-14 DIAGNOSIS — I120 Hypertensive chronic kidney disease with stage 5 chronic kidney disease or end stage renal disease: Secondary | ICD-10-CM | POA: Insufficient documentation

## 2016-07-14 DIAGNOSIS — R030 Elevated blood-pressure reading, without diagnosis of hypertension: Secondary | ICD-10-CM

## 2016-07-14 DIAGNOSIS — Z760 Encounter for issue of repeat prescription: Secondary | ICD-10-CM | POA: Insufficient documentation

## 2016-07-14 DIAGNOSIS — Z87891 Personal history of nicotine dependence: Secondary | ICD-10-CM | POA: Insufficient documentation

## 2016-07-14 DIAGNOSIS — IMO0001 Reserved for inherently not codable concepts without codable children: Secondary | ICD-10-CM

## 2016-07-14 DIAGNOSIS — J449 Chronic obstructive pulmonary disease, unspecified: Secondary | ICD-10-CM | POA: Insufficient documentation

## 2016-07-14 DIAGNOSIS — N186 End stage renal disease: Secondary | ICD-10-CM | POA: Insufficient documentation

## 2016-07-14 MED ORDER — LABETALOL HCL 200 MG PO TABS
600.0000 mg | ORAL_TABLET | Freq: Once | ORAL | Status: AC
  Administered 2016-07-14: 600 mg via ORAL
  Filled 2016-07-14: qty 3

## 2016-07-14 NOTE — ED Provider Notes (Signed)
[facility] DEPT Provider Note   CSN: [id number] Arrival date & time: 07/14/16  2039   By signing my name below, I, Delton Prairie, attest that this documentation has been prepared under the direction and in the presence of  [employer], PA-C. Electronically Signed: Delton Prairie, ED Scribe. 07/14/16. 8:59 PM.   History   Chief Complaint Chief Complaint  Patient presents with  . Hypertension  . Medication Refill    The history is provided by the patient. No language interpreter was used.    HPI Comments:  Johnny [name] is a 55 y.o. male, with a hx of chronic hypertension, who presents to the Emergency Department complaining of high blood pressure and needing a medication refill. Pt states he has run out of his medication since last night. He notes he has a prescription but does not have insurance at this time .Pt denies chest pain or SOB. Pt has no other physical complaints at this time. No alleviating factors noted.   Past Medical History:  Diagnosis Date  . Chronic kidney disease   . COPD (chronic obstructive pulmonary disease) ([place])   . Hepatitis C   . Hypertension   . OSA (obstructive sleep apnea) 11/03/2015    Patient Active Problem List   Diagnosis Date Noted  . COPD exacerbation ([place]) 06/07/2016  . CKD (chronic kidney disease) stage 3, GFR 30-59 ml/min 06/07/2016  . Hypertension, uncontrolled 06/07/2016  . Nausea vomiting and diarrhea 06/07/2016  . Acute bronchitis 06/07/2016  . Chronic hepatitis C without hepatic coma ([place]) 03/21/2016  . Excessive sleepiness 02/02/2016  . OSA (obstructive sleep apnea) 11/03/2015  . End stage renal disease ([place]) 04/01/2012    Past Surgical History:  Procedure Laterality Date  . AV FISTULA PLACEMENT  09/12/2009   Left arm AVF      Home Medications    Prior to Admission medications   Medication Sig Start Date End Date Taking? Authorizing Provider  albuterol (PROVENTIL HFA;VENTOLIN HFA) 108 (90 Base) MCG/ACT inhaler  Inhale 2 puffs into the lungs every 6 (six) hours as needed for wheezing or shortness of breath. Reported on 12/09/2015 06/01/16   Orpah Greek, MD  albuterol (PROVENTIL HFA;VENTOLIN HFA) 108 (90 Base) MCG/ACT inhaler Inhale 2 puffs into the lungs every 4 (four) hours as needed for wheezing or shortness of breath. 06/08/16   Reyne Dumas, MD  amLODipine (NORVASC) 10 MG tablet Take 1 tablet (10 mg total) by mouth daily. 06/08/16   Reyne Dumas, MD  azithromycin (ZITHROMAX) 500 MG tablet Take 1 tablet (500 mg total) by mouth daily. 06/08/16   Reyne Dumas, MD  colchicine 0.6 MG tablet Take 1 tablet (0.6 mg total) by mouth 2 (two) times daily. 07/02/15   Billy Fischer, MD  Elbasvir-Grazoprevir (ZEPATIER) 50-100 MG TABS Take 1 tablet by mouth daily. Patient not taking: Reported on 06/07/2016 06/04/16   Thayer Headings, MD  labetalol (NORMODYNE) 200 MG tablet Take 3 tablets (600 mg total) by mouth 3 (three) times daily. 06/08/16 07/08/16  Reyne Dumas, MD  mometasone-formoterol (DULERA) 200-5 MCG/ACT AERO Inhale 2 puffs into the lungs 2 (two) times daily. 06/08/16   Reyne Dumas, MD  traMADol (ULTRAM) 50 MG tablet Take 1 tablet (50 mg total) by mouth every 6 (six) hours as needed. Patient taking differently: Take 50 mg by mouth every 6 (six) hours as needed for moderate pain.  04/04/15   Gregor Hams, MD    Family History Family History  Problem Relation Age of Onset  .  Hypertension Mother   . Diabetes Mother   . Colon cancer Neg Hx     Social History Social History  Substance Use Topics  . Smoking status: Former Smoker    Packs/day: 0.10    Types: Cigarettes    Quit date: 04/02/2011  . Smokeless tobacco: Never Used     Comment: trying to quit  . Alcohol use No     Allergies   Naproxen   Review of Systems Review of Systems 10 systems reviewed and all are negative for acute change except as noted in the HPI.   Physical Exam Updated Vital Signs BP 142/85 (BP Location: Right Arm)   Pulse 65    Temp 98.1 F (36.7 C) (Oral)   Resp 18   SpO2 99%   Physical Exam  Constitutional: He is oriented to person, place, and time. He appears well-developed and well-nourished. No distress.  HENT:  Head: Normocephalic and atraumatic.  Mouth/Throat: Oropharynx is clear and moist.  Eyes: Conjunctivae and EOM are normal. Pupils are equal, round, and reactive to light.  Neck: Normal range of motion.  Cardiovascular: Normal rate, regular rhythm and intact distal pulses.   Pulmonary/Chest: Effort normal and breath sounds normal.  Abdominal: Soft. There is no tenderness.  Musculoskeletal: Normal range of motion.  Neurological: He is alert and oriented to person, place, and time.  Skin: He is not diaphoretic.  Psychiatric: He has a normal mood and affect.  Nursing note and vitals reviewed.    ED Treatments / Results  DIAGNOSTIC STUDIES:  Oxygen Saturation is 99% on RA, normal by my interpretation.    COORDINATION OF CARE:  8:55 PM Discussed treatment plan with pt at bedside and pt agreed to plan.  Labs (all labs ordered are listed, but only abnormal results are displayed) Labs Reviewed - No data to display  EKG  EKG Interpretation None       Radiology No results found.  Procedures Procedures (including critical care time)  Medications Ordered in ED Medications - No data to display   Initial Impression / Assessment and Plan / ED Course  I have reviewed the triage vital signs and the nursing notes.  Pertinent labs & imaging results that were available during my care of the patient were reviewed by me and considered in my medical decision making (see chart for details).  Clinical Course    Vitals:   07/14/16 2047 07/14/16 2116  BP: 142/85 147/95  Pulse: 65 68  Resp: 18 16  Temp: 98.1 F (36.7 C) 98.1 F (36.7 C)  TempSrc: Oral Oral  SpO2: 99% 98%    Medications  labetalol (NORMODYNE) tablet 600 mg (600 mg Oral Given 07/14/16 2110)    Johnny [name] is 55  y.o. male requesting his high blood pressure medication, patient states he is asymptomatic, he's only been out for 1 day. States he has a prescription for his labetalol at home however, he cannot afford it. Patient is given single dose in the ED, it appears that he is in the process of obtaining Medicaid. Case management consulted, advised him that they will call to try to help him with his medication needs. He is invited to return to the ED if he develops any symptoms.  Evaluation does not show pathology that would require ongoing emergent intervention or inpatient treatment. Pt is hemodynamically stable and mentating appropriately. Discussed findings and plan with patient/guardian, who agrees with care plan. All questions answered. Return precautions discussed and outpatient follow up given.  Final Clinical Impressions(s) / ED Diagnoses   Final diagnoses:  None    New Prescriptions New Prescriptions   No medications on file   I personally performed the services described in this documentation, which was scribed in my presence. The recorded information has been reviewed and is accurate.     Monico Blitz, PA-C 07/14/16 5844    Pattricia Boss, MD 07/15/16 [phone]

## 2016-07-14 NOTE — ED Triage Notes (Addendum)
Pt states his blood pressure is up. Pt does not know how high but when it get high he starts to have pounding in his head. Pt ran out of blood pressure medication last night. Pt states he has a refill script at home but needs assistant help paying for it.

## 2016-07-14 NOTE — Discharge Instructions (Signed)
Do not hesitate to return to the emergency room for any new, worsening or concerning symptoms. ° °Please obtain primary care using resource guide below. Let them know that you were seen in the emergency room and that they will need to obtain records for further outpatient management. ° ° °

## 2016-07-14 NOTE — ED Notes (Signed)
Pt. requesting medication for his hypertension , he ran out his medications with financial issues , denies pain / respirations unlabored .

## 2016-07-16 ENCOUNTER — Encounter (HOSPITAL_COMMUNITY): Payer: Self-pay | Admitting: Emergency Medicine

## 2016-07-16 ENCOUNTER — Emergency Department (HOSPITAL_COMMUNITY)
Admission: EM | Admit: 2016-07-16 | Discharge: 2016-07-16 | Disposition: A | Discharge status: Home / Self Care | Payer: Medicaid Other | Attending: Emergency Medicine | Admitting: Emergency Medicine

## 2016-07-16 DIAGNOSIS — N186 End stage renal disease: Secondary | ICD-10-CM | POA: Insufficient documentation

## 2016-07-16 DIAGNOSIS — J449 Chronic obstructive pulmonary disease, unspecified: Secondary | ICD-10-CM | POA: Insufficient documentation

## 2016-07-16 DIAGNOSIS — I120 Hypertensive chronic kidney disease with stage 5 chronic kidney disease or end stage renal disease: Secondary | ICD-10-CM | POA: Insufficient documentation

## 2016-07-16 DIAGNOSIS — Z87891 Personal history of nicotine dependence: Secondary | ICD-10-CM | POA: Insufficient documentation

## 2016-07-16 DIAGNOSIS — Z760 Encounter for issue of repeat prescription: Secondary | ICD-10-CM

## 2016-07-16 MED ORDER — LABETALOL HCL 200 MG PO TABS
600.0000 mg | ORAL_TABLET | Freq: Once | ORAL | Status: AC
  Administered 2016-07-16: 600 mg via ORAL
  Filled 2016-07-16: qty 3

## 2016-07-16 MED ORDER — LABETALOL HCL 100 MG PO TABS: 200.0000 mg | ORAL_TABLET | Freq: Three times a day (TID) | ORAL | 0 refills | Status: DC

## 2016-07-16 NOTE — ED Triage Notes (Signed)
Pt here for refill of labetalol. Pt sts he has been of med for 4 days.

## 2016-07-16 NOTE — ED Notes (Signed)
Case management at bedside.

## 2016-07-16 NOTE — Care Management (Signed)
ED CM consulted concerning follow up care. CM met with patient at bedside to discuss care trasition, patient reports he was a patient of the late Dr. Blount and has not had follow up since his last appointment. He has run out of his b/p medications and needs follow up. Referral made to the [facility]. 

## 2016-07-16 NOTE — Telephone Encounter (Signed)
He needs to sign [employer] application and bring in proof of income to be able to receive Zepatier.  I tried him 06/07/16, 06/11/16 & 06/12/16 with no response (voicemails were left).

## 2016-07-16 NOTE — ED Notes (Signed)
Gave pt ice water, per [facility].

## 2016-07-16 NOTE — ED Provider Notes (Signed)
[facility] DEPT Provider Note   CSN: [id number] Arrival date & time: 07/16/16  1859     History   Chief Complaint Chief Complaint  Patient presents with  . Medication Refill    HPI Johnny [name] is a 55 y.o. male who presents to the ED for medication refill. He reports that he has 4 refills on his labetalol but does not have the money to fill the Rx. The medication was added to his Norvasc to keep his BP under control.   The history is provided by the patient. No language interpreter was used.  Medication Refill  Reason for refill: yes patient has medication bottle. Patient has complete original prescription information: yes     Past Medical History:  Diagnosis Date  . Chronic kidney disease   . COPD (chronic obstructive pulmonary disease) ([place])   . Hepatitis C   . Hypertension   . OSA (obstructive sleep apnea) 11/03/2015    Patient Active Problem List   Diagnosis Date Noted  . COPD exacerbation ([place]) 06/07/2016  . CKD (chronic kidney disease) stage 3, GFR 30-59 ml/min 06/07/2016  . Hypertension, uncontrolled 06/07/2016  . Nausea vomiting and diarrhea 06/07/2016  . Acute bronchitis 06/07/2016  . Chronic hepatitis C without hepatic coma ([place]) 03/21/2016  . Excessive sleepiness 02/02/2016  . OSA (obstructive sleep apnea) 11/03/2015  . End stage renal disease ([place]) 04/01/2012    Past Surgical History:  Procedure Laterality Date  . AV FISTULA PLACEMENT  09/12/2009   Left arm AVF       Home Medications    Prior to Admission medications   Medication Sig Start Date End Date Taking? Authorizing Provider  albuterol (PROVENTIL HFA;VENTOLIN HFA) 108 (90 Base) MCG/ACT inhaler Inhale 2 puffs into the lungs every 6 (six) hours as needed for wheezing or shortness of breath. Reported on 12/09/2015 06/01/16   Orpah Greek, MD  albuterol (PROVENTIL HFA;VENTOLIN HFA) 108 (90 Base) MCG/ACT inhaler Inhale 2 puffs into the lungs every 4 (four) hours as needed for  wheezing or shortness of breath. 06/08/16   Reyne Dumas, MD  amLODipine (NORVASC) 10 MG tablet Take 1 tablet (10 mg total) by mouth daily. 06/08/16   Reyne Dumas, MD  azithromycin (ZITHROMAX) 500 MG tablet Take 1 tablet (500 mg total) by mouth daily. 06/08/16   Reyne Dumas, MD  colchicine 0.6 MG tablet Take 1 tablet (0.6 mg total) by mouth 2 (two) times daily. 07/02/15   Billy Fischer, MD  Elbasvir-Grazoprevir (ZEPATIER) 50-100 MG TABS Take 1 tablet by mouth daily. Patient not taking: Reported on 06/07/2016 06/04/16   Thayer Headings, MD  labetalol (NORMODYNE) 100 MG tablet Take 2 tablets (200 mg total) by mouth 3 (three) times daily. 07/16/16   Traeton Bordas Bunnie Pion, NP  mometasone-formoterol (DULERA) 200-5 MCG/ACT AERO Inhale 2 puffs into the lungs 2 (two) times daily. 06/08/16   Reyne Dumas, MD  traMADol (ULTRAM) 50 MG tablet Take 1 tablet (50 mg total) by mouth every 6 (six) hours as needed. Patient taking differently: Take 50 mg by mouth every 6 (six) hours as needed for moderate pain.  04/04/15   Gregor Hams, MD    Family History Family History  Problem Relation Age of Onset  . Hypertension Mother   . Diabetes Mother   . Colon cancer Neg Hx     Social History Social History  Substance Use Topics  . Smoking status: Former Smoker    Packs/day: 0.10    Types: Cigarettes  Quit date: 04/02/2011  . Smokeless tobacco: Never Used     Comment: trying to quit  . Alcohol use No     Allergies   Naproxen   Review of Systems Review of Systems  Neurological: Negative for dizziness, syncope and light-headedness. Headaches: mild.     Physical Exam Updated Vital Signs BP 157/90 (BP Location: Right Arm)   Pulse 74   Temp 98.1 F (36.7 C) (Oral)   Resp 18   Ht 6\' 1"  (1.854 m)   Wt 95.3 kg   SpO2 98%   BMI 27.71 kg/m   Physical Exam  Constitutional: He is oriented to person, place, and time. He appears well-developed and well-nourished.  HENT:  Head: Normocephalic and atraumatic.  Eyes:  EOM are normal.  Neck: Neck supple.  Cardiovascular: Normal rate and regular rhythm.   Pulmonary/Chest: Effort normal and breath sounds normal. No respiratory distress.  Abdominal: He exhibits no distension.  Musculoskeletal: Normal range of motion.  Neurological: He is alert and oriented to person, place, and time. No cranial nerve deficit.  Skin: Skin is warm and dry.  Psychiatric: He has a normal mood and affect. His behavior is normal.  Nursing note and vitals reviewed.    ED Treatments / Results  Labs (all labs ordered are listed, but only abnormal results are displayed) Labs Reviewed - No data to display   Radiology No results found.  Procedures Procedures (including critical care time)  Medications Ordered in ED Medications - No data to display   Initial Impression / Assessment and Plan / ED Course  I have reviewed the triage vital signs and the nursing notes.  Pertinent labs & imaging results that were available during my care of the patient were reviewed by me and considered in my medical decision making (see chart for details).  Clinical Course   Case Manager her to help patient with getting medication filled.   Final Clinical Impressions(s) / ED Diagnoses  55 y.o. male here for medication stable for d/c without symptoms at this time. He will f/u at the [facility] tomorrow to discuss his medication and get his refill.   Final diagnoses:  Medication refill    New Prescriptions New Prescriptions   LABETALOL (NORMODYNE) 100 MG TABLET    Take 2 tablets (200 mg total) by mouth 3 (three) times daily.     [place], [place] 07/16/16 2123    Merrily Pew, MD 07/16/16 [phone]

## 2016-07-17 MED FILL — LABETALOL HCL 100 MG TABLET: 100 | 30 days supply | Qty: 180 | Fill #0

## 2016-07-19 ENCOUNTER — Inpatient Hospital Stay: Payer: Self-pay | Admitting: Family Medicine

## 2016-07-20 ENCOUNTER — Inpatient Hospital Stay: Payer: Self-pay

## 2016-07-23 ENCOUNTER — Inpatient Hospital Stay: Payer: Self-pay | Admitting: Internal Medicine

## 2016-07-23 ENCOUNTER — Inpatient Hospital Stay: Payer: Self-pay

## 2016-07-26 ENCOUNTER — Other Ambulatory Visit: Payer: Self-pay

## 2016-07-26 NOTE — Telephone Encounter (Signed)
Yes - patient has never been seen here, we cannot refill any of his medications until he is seen here.

## 2016-07-26 NOTE — Telephone Encounter (Signed)
Patient contacted the office and he is requesting a refill on his Colchicine 0.6mg . Pt states he did have a hospital f/u on 9/18 but he was unable to show. I informed pt that he will need to contact the office on Monday July 30, 2016 to reschedule that hospital f/u.

## 2016-09-03 ENCOUNTER — Emergency Department (HOSPITAL_COMMUNITY): Payer: Self-pay

## 2016-09-03 ENCOUNTER — Encounter (HOSPITAL_COMMUNITY): Payer: Self-pay | Admitting: Emergency Medicine

## 2016-09-03 ENCOUNTER — Emergency Department (HOSPITAL_COMMUNITY)
Admission: EM | Admit: 2016-09-03 | Discharge: 2016-09-03 | Disposition: A | Discharge status: Home / Self Care | Payer: Self-pay | Attending: Emergency Medicine | Admitting: Emergency Medicine

## 2016-09-03 DIAGNOSIS — Z79899 Other long term (current) drug therapy: Secondary | ICD-10-CM | POA: Insufficient documentation

## 2016-09-03 DIAGNOSIS — Z87891 Personal history of nicotine dependence: Secondary | ICD-10-CM | POA: Insufficient documentation

## 2016-09-03 DIAGNOSIS — I5023 Acute on chronic systolic (congestive) heart failure: Secondary | ICD-10-CM | POA: Insufficient documentation

## 2016-09-03 DIAGNOSIS — N186 End stage renal disease: Secondary | ICD-10-CM | POA: Insufficient documentation

## 2016-09-03 DIAGNOSIS — J449 Chronic obstructive pulmonary disease, unspecified: Secondary | ICD-10-CM | POA: Insufficient documentation

## 2016-09-03 DIAGNOSIS — I132 Hypertensive heart and chronic kidney disease with heart failure and with stage 5 chronic kidney disease, or end stage renal disease: Secondary | ICD-10-CM | POA: Insufficient documentation

## 2016-09-03 LAB — BASIC METABOLIC PANEL
ANION GAP: 10 (ref 5–15)
BUN: 29 mg/dL — ABNORMAL HIGH (ref 6–20)
CHLORIDE: 108 mmol/L (ref 101–111)
CO2: 21 mmol/L — AB (ref 22–32)
Calcium: 9.1 mg/dL (ref 8.9–10.3)
Creatinine, Ser: 2.32 mg/dL — ABNORMAL HIGH (ref 0.61–1.24)
GFR calc Af Amer: 35 mL/min — ABNORMAL LOW (ref >60)
GFR, EST NON AFRICAN AMERICAN: 30 mL/min — AB (ref >60)
GLUCOSE: 106 mg/dL — AB (ref 65–99)
POTASSIUM: 4.2 mmol/L (ref 3.5–5.1)
Sodium: 139 mmol/L (ref 135–145)

## 2016-09-03 LAB — CBC WITH DIFFERENTIAL/PLATELET
BASOS PCT: 0 %
Basophils Absolute: 0 10*3/uL (ref 0.0–0.1)
Eosinophils Absolute: 0.1 10*3/uL (ref 0.0–0.7)
Eosinophils Relative: 2 %
HEMATOCRIT: 42 % (ref 39.0–52.0)
HEMOGLOBIN: 14.9 g/dL (ref 13.0–17.0)
Lymphocytes Relative: 16 %
Lymphs Abs: 1 10*3/uL (ref 0.7–4.0)
MCH: 28.4 pg (ref 26.0–34.0)
MCHC: 35.5 g/dL (ref 30.0–36.0)
MCV: 80 fL (ref 78.0–100.0)
MONOS PCT: 3 %
Monocytes Absolute: 0.2 10*3/uL (ref 0.1–1.0)
NEUTROS ABS: 4.8 10*3/uL (ref 1.7–7.7)
NEUTROS PCT: 79 %
Platelets: 107 10*3/uL — ABNORMAL LOW (ref 150–400)
RBC: 5.25 MIL/uL (ref 4.22–5.81)
RDW: 16.5 % — ABNORMAL HIGH (ref 11.5–15.5)
WBC: 6.1 10*3/uL (ref 4.0–10.5)

## 2016-09-03 LAB — BRAIN NATRIURETIC PEPTIDE: B Natriuretic Peptide: 554.2 pg/mL — ABNORMAL HIGH (ref 0.0–100.0)

## 2016-09-03 LAB — TROPONIN I: Troponin I: 0.04 ng/mL (ref <0.03)

## 2016-09-03 MED ORDER — ALBUTEROL SULFATE (2.5 MG/3ML) 0.083% IN NEBU: 2.5000 mg | INHALATION_SOLUTION | RESPIRATORY_TRACT | 0 refills | Status: DC | PRN

## 2016-09-03 MED ORDER — AMLODIPINE BESYLATE 10 MG PO TABS: 10.0000 mg | ORAL_TABLET | Freq: Every day | ORAL | 0 refills | Status: DC

## 2016-09-03 MED ORDER — ALBUTEROL SULFATE HFA 108 (90 BASE) MCG/ACT IN AERS
2.0000 | INHALATION_SPRAY | RESPIRATORY_TRACT | Status: DC | PRN
  Administered 2016-09-03: 2 via RESPIRATORY_TRACT
  Filled 2016-09-03: qty 6.7

## 2016-09-03 MED ORDER — FUROSEMIDE 20 MG PO TABS: 20.0000 mg | ORAL_TABLET | Freq: Two times a day (BID) | ORAL | 0 refills | Status: DC

## 2016-09-03 NOTE — Discharge Instructions (Signed)
Your increase trouble breathing may be sign of congestive heart failure.  Please take Lasix to help remove extra fluid from your body, which will help with your symptoms.  Follow up closely with your doctor for further management, including changing your blood pressure medication and provide you additional meds for your COPD and obstructive sleep apnea.  Return to ER if you have any concerns.

## 2016-09-03 NOTE — ED Provider Notes (Signed)
[facility] DEPT Provider Note   CSN: [id number] Arrival date & time: 09/03/16  4235     History   Chief Complaint Chief Complaint  Patient presents with  . Shortness of Breath    HPI Johnny [name] is a 55 y.o. male.  HPI  55 year old male with history of COPD, OSA, hypertension brought here via EMS from home with complaints of shortness of breath. Patient reports for the past week he has had increased shortness of breath, wheezing, and generalized fatigue. Symptoms worsen with exertion. Wheezing was worse at night. He has been using his albuterol inhaler 3-4 times every couple of hours which is much more than his usual usage. Also complaining of occasional pain in the back of his head which he felt was related to his high blood pressure. States that he was prescribed 2 different blood pressure medication but only has one of them filled. Patient admits history of asthma and COPD diagnosed 10 years ago and was recently told that he has obstructive sleep apnea 3 months ago. He does not have a CPAP machine. He has a nebulizer machine at home without any nebs solution. He denies having fever, URI symptoms, productive cough, hemoptysis, neck stiffness, exertional chest pain, nausea vomiting diarrhea, diaphoresis, lightheadedness or dizziness, abdominal pain or back pain, focal numbness or weakness, or rash. No prior history of PE or DVT, no recent surgery, prolonged bed rest, unilateral swelling or calf pain, or active cancer. He is a former smoker and has quit more than 10 years ago. Does have history of end-stage renal disease. Patient received 2 breathing treatments prior to arrival from EMS and at this time he states he feels better.   Past Medical History:  Diagnosis Date  . Chronic kidney disease   . COPD (chronic obstructive pulmonary disease) ([place])   . Hepatitis C   . Hypertension   . OSA (obstructive sleep apnea) 11/03/2015    Patient Active Problem List   Diagnosis Date  Noted  . COPD exacerbation ([place]) 06/07/2016  . CKD (chronic kidney disease) stage 3, GFR 30-59 ml/min 06/07/2016  . Hypertension, uncontrolled 06/07/2016  . Nausea vomiting and diarrhea 06/07/2016  . Acute bronchitis 06/07/2016  . Chronic hepatitis C without hepatic coma ([place]) 03/21/2016  . Excessive sleepiness 02/02/2016  . OSA (obstructive sleep apnea) 11/03/2015  . End stage renal disease ([place]) 04/01/2012    Past Surgical History:  Procedure Laterality Date  . AV FISTULA PLACEMENT  09/12/2009   Left arm AVF       Home Medications    Prior to Admission medications   Medication Sig Start Date End Date Taking? Authorizing Provider  albuterol (PROVENTIL HFA;VENTOLIN HFA) 108 (90 Base) MCG/ACT inhaler Inhale 2 puffs into the lungs every 6 (six) hours as needed for wheezing or shortness of breath. Reported on 12/09/2015 06/01/16   Orpah Greek, MD  albuterol (PROVENTIL HFA;VENTOLIN HFA) 108 (90 Base) MCG/ACT inhaler Inhale 2 puffs into the lungs every 4 (four) hours as needed for wheezing or shortness of breath. 06/08/16   Reyne Dumas, MD  amLODipine (NORVASC) 10 MG tablet Take 1 tablet (10 mg total) by mouth daily. 06/08/16   Reyne Dumas, MD  azithromycin (ZITHROMAX) 500 MG tablet Take 1 tablet (500 mg total) by mouth daily. 06/08/16   Reyne Dumas, MD  colchicine 0.6 MG tablet Take 1 tablet (0.6 mg total) by mouth 2 (two) times daily. 07/02/15   Billy Fischer, MD  Elbasvir-Grazoprevir (ZEPATIER) 50-100 MG TABS Take 1  tablet by mouth daily. Patient not taking: Reported on 06/07/2016 06/04/16   Thayer Headings, MD  labetalol (NORMODYNE) 100 MG tablet Take 2 tablets (200 mg total) by mouth 3 (three) times daily. 07/16/16   Hope Bunnie Pion, NP  mometasone-formoterol (DULERA) 200-5 MCG/ACT AERO Inhale 2 puffs into the lungs 2 (two) times daily. 06/08/16   Reyne Dumas, MD  traMADol (ULTRAM) 50 MG tablet Take 1 tablet (50 mg total) by mouth every 6 (six) hours as needed. Patient taking  differently: Take 50 mg by mouth every 6 (six) hours as needed for moderate pain.  04/04/15   Gregor Hams, MD    Family History Family History  Problem Relation Age of Onset  . Hypertension Mother   . Diabetes Mother   . Colon cancer Neg Hx     Social History Social History  Substance Use Topics  . Smoking status: Former Smoker    Packs/day: 0.10    Types: Cigarettes    Quit date: 04/02/2011  . Smokeless tobacco: Never Used     Comment: trying to quit  . Alcohol use 0.0 oz/week     Comment: occasional     Allergies   Naproxen   Review of Systems Review of Systems  All other systems reviewed and are negative.    Physical Exam Updated Vital Signs BP (!) 153/111 (BP Location: Right Arm)   Pulse 66   Temp 97.7 F (36.5 C) (Oral)   Resp 22   Ht 6\' 1"  (1.854 m)   Wt 95.3 kg   SpO2 100% Comment: Simultaneous filing. User may not have seen previous data.  BMI 27.71 kg/m   Physical Exam  Constitutional: He appears well-developed and well-nourished. No distress.  Able to speak in complete sentences, nontoxic in appearance.  HENT:  Head: Atraumatic.  Right Ear: External ear normal.  Left Ear: External ear normal.  Mouth/Throat: Oropharynx is clear and moist.  Eyes: Conjunctivae are normal.  Neck: Neck supple. No JVD present.  Cardiovascular: Normal rate, regular rhythm and intact distal pulses.   Pulmonary/Chest: No respiratory distress. He has no wheezes. He has rales.  Abdominal: Soft. Bowel sounds are normal. He exhibits no distension. There is no tenderness.  Musculoskeletal:  Bilateral lower extremities without palpable cords, erythema, or edema.  Neurological: He is alert.  Skin: No rash noted.  Psychiatric: He has a normal mood and affect.  Nursing note and vitals reviewed.    ED Treatments / Results  Labs (all labs ordered are listed, but only abnormal results are displayed) Labs Reviewed - No data to display  EKG  EKG  Interpretation  Date/Time:  Monday September 03 2016 09:28:54 EDT Ventricular Rate:  64 PR Interval:    QRS Duration: 103 QT Interval:  433 QTC Calculation: 447 R Axis:   30 Text Interpretation:  Sinus rhythm LAE, consider biatrial enlargement RSR' in V1 or V2, probably normal variant Borderline repolarization abnormality ST elevation, consider anterolateral injury No STEMI. ST changes similar to 06/2016 tracing Confirmed by LONG MD, JOSHUA [id number]) on 09/03/2016 9:36:25 AM       Radiology Dg Chest 2 View  Result Date: 09/03/2016 CLINICAL DATA:  Shortness of breath for 2 days EXAM: CHEST  2 VIEW COMPARISON:  06/06/2016 FINDINGS: Cardiac shadow is again enlarged. Mild vascular congestion is again seen. Very minimal interstitial edema is noted. No focal infiltrate or sizable effusion is seen. No bony abnormality is noted. IMPRESSION: Mild CHF Electronically Signed   By: Elta Guadeloupe  Lukens M.D.   On: 09/03/2016 10:20    Procedures Procedures (including critical care time)  Medications Ordered in ED Medications - No data to display   Initial Impression / Assessment and Plan / ED Course  I have reviewed the triage vital signs and the nursing notes.  Pertinent labs & imaging results that were available during my care of the patient were reviewed by me and considered in my medical decision making (see chart for details).  Clinical Course    BP (!) 160/111 (BP Location: Right Arm)   Pulse 78   Temp 97.7 F (36.5 C) (Oral)   Resp 16   Ht 6\' 1"  (1.854 m)   Wt 95.3 kg   SpO2 96%   BMI 27.71 kg/m    Final Clinical Impressions(s) / ED Diagnoses   Final diagnoses:  Acute on chronic systolic congestive heart failure (HCC)    New Prescriptions New Prescriptions   FUROSEMIDE (LASIX) 20 MG TABLET    Take 1 tablet (20 mg total) by mouth 2 (two) times daily.   10:59 AM Patient here with complaints of wheezing and shortness of breath. Symptom appears to suggest CHF. Does have history of  COPD but no history of CHF. On exam, faint crackles heard at the base of lung. Patient denies having fever or productive cough to suggest an infectious etiology. He is not actively wheezing and no significant respiratory discomfort. Workup initiated.  1:15 PM CXR with mild CHF, BNP of 554.2, no prior value for comparison.  Creatinine of 2.32, near baseline. Mildly elevated trop of 0.04, however improves from prior value. CXR without acute ischemic changes.  Pt ambulate while maintaining adequate O2.  He does have a PCP at [facility].  Plan to prescribe a low does Lasix and have pt f/u with PCP for further care.  Care discussed with Dr. Laverta Baltimore.     Domenic Moras, PA-C 09/03/16 [place], MD 09/03/16 [phone]

## 2016-09-03 NOTE — ED Notes (Signed)
Critical Lab- Troponin 0.04. EDP Rona Ravens notified.

## 2016-09-03 NOTE — ED Notes (Signed)
Walked patient with pulse oxy patient did fine stayed 95 room air 86 heart rate

## 2016-09-03 NOTE — ED Triage Notes (Signed)
Per EMS, Pt complains of wheezing and SOB x 2 days. Pt received 2 Duonebs, 125 mg Solu-medrol en route. Pt reports hx of asthma and HTN. Pt currently not in respiratory distress.

## 2016-09-04 MED FILL — ALBUTEROL 0.083% INHAL SOLN: (2.5 MG/3ML | 30 days supply | Qty: 90 | Fill #0

## 2016-09-04 MED FILL — ?AMLODIPINE BESYLATE 10 MG: 10 | 30 days supply | Qty: 30 | Fill #0

## 2016-09-04 MED FILL — FUROSEMIDE 20 MG TABLET: 20 | 15 days supply | Qty: 30 | Fill #0

## 2016-09-14 ENCOUNTER — Ambulatory Visit: Payer: Self-pay | Attending: Internal Medicine | Admitting: Physician Assistant

## 2016-09-14 VITALS — BP 131/89 | HR 68 | Temp 97.8°F | Resp 16 | Wt 200.6 lb

## 2016-09-14 DIAGNOSIS — I130 Hypertensive heart and chronic kidney disease with heart failure and stage 1 through stage 4 chronic kidney disease, or unspecified chronic kidney disease: Secondary | ICD-10-CM | POA: Insufficient documentation

## 2016-09-14 DIAGNOSIS — N183 Chronic kidney disease, stage 3 unspecified: Secondary | ICD-10-CM

## 2016-09-14 DIAGNOSIS — Z888 Allergy status to other drugs, medicaments and biological substances status: Secondary | ICD-10-CM | POA: Insufficient documentation

## 2016-09-14 DIAGNOSIS — J449 Chronic obstructive pulmonary disease, unspecified: Secondary | ICD-10-CM | POA: Insufficient documentation

## 2016-09-14 DIAGNOSIS — Z79899 Other long term (current) drug therapy: Secondary | ICD-10-CM | POA: Insufficient documentation

## 2016-09-14 DIAGNOSIS — I5032 Chronic diastolic (congestive) heart failure: Secondary | ICD-10-CM

## 2016-09-14 DIAGNOSIS — I10 Essential (primary) hypertension: Secondary | ICD-10-CM

## 2016-09-14 DIAGNOSIS — G4733 Obstructive sleep apnea (adult) (pediatric): Secondary | ICD-10-CM | POA: Insufficient documentation

## 2016-09-14 DIAGNOSIS — I509 Heart failure, unspecified: Secondary | ICD-10-CM | POA: Insufficient documentation

## 2016-09-14 DIAGNOSIS — Z7951 Long term (current) use of inhaled steroids: Secondary | ICD-10-CM | POA: Insufficient documentation

## 2016-09-14 MED ORDER — LABETALOL HCL 100 MG PO TABS: 200.0000 mg | ORAL_TABLET | Freq: Three times a day (TID) | ORAL | 2 refills | Status: DC

## 2016-09-14 MED ORDER — FUROSEMIDE 20 MG PO TABS: 20.0000 mg | ORAL_TABLET | Freq: Two times a day (BID) | ORAL | 0 refills | Status: DC

## 2016-09-14 MED ORDER — AMLODIPINE BESYLATE 10 MG PO TABS: 10.0000 mg | ORAL_TABLET | Freq: Every day | ORAL | 2 refills | Status: DC

## 2016-09-14 NOTE — Progress Notes (Signed)
Johnny [name], is a 55 y.o. male  GYJ:[id number]  YOV:[id number]  DOB - Mar 30, 1961  Subjective:  Chief Complaint and HPI: Johnny [name] is a 55 y.o. male here today to establish care and for a follow up visit after being seen in the ED for elevated BP and a new diagnosis of CHF with an elevated BNP.  He has chronic kidney disease with a Fistula in place in his L forearm.  He is not on dialysis.  He is a patient at [facility] and sees Dr. Justin Mend there at least 2X per year.  He was a patient of Dr. Ollen Gross Blount's until his death.  He is working on getting his medicaid re-instated now.  His SOB and edema have been much improved since his recent ED visit.  He was started on and is tolerating Lasix.  He does not have any new complaints today.  His BP goal is 120/80 due to his kidney disease.  He has not always been compliant on his meds and has frequently run out of medications.  ED/Hospital notes reviewed.    ROS:   Constitutional:  No f/c, No night sweats, No unexplained weight loss. EENT:  No vision changes, No blurry vision, No hearing changes. No mouth, throat, or ear problems.  Respiratory: No cough, +mild SOB Cardiac: No CP, no palpitations GI:  No abd pain, No N/V/D. GU: No Urinary s/sx Musculoskeletal: No joint pain Neuro: No headache, no dizziness, no motor weakness.  Skin: No rash Endocrine:  No polydipsia. No polyuria.  Psych: Denies SI/HI  No problems updated.  ALLERGIES: Allergies  Allergen Reactions  . Naproxen Other (See Comments)    Unknown    PAST MEDICAL HISTORY: Past Medical History:  Diagnosis Date  . Chronic kidney disease   . COPD (chronic obstructive pulmonary disease) ([place])   . Hepatitis C   . Hypertension   . OSA (obstructive sleep apnea) 11/03/2015    MEDICATIONS AT HOME: Prior to Admission medications   Medication Sig Start Date End Date Taking? Authorizing Provider  albuterol (PROVENTIL HFA;VENTOLIN HFA) 108 (90 Base) MCG/ACT inhaler Inhale 2  puffs into the lungs every 6 (six) hours as needed for wheezing or shortness of breath. Reported on 12/09/2015 06/01/16   Orpah Greek, MD  albuterol (PROVENTIL HFA;VENTOLIN HFA) 108 (90 Base) MCG/ACT inhaler Inhale 2 puffs into the lungs every 4 (four) hours as needed for wheezing or shortness of breath. Patient not taking: Reported on 09/14/2016 06/08/16   Reyne Dumas, MD  albuterol (PROVENTIL) (2.5 MG/3ML) 0.083% nebulizer solution Take 3 mLs (2.5 mg total) by nebulization every 4 (four) hours as needed for wheezing or shortness of breath. 09/03/16   Domenic Moras, PA-C  allopurinol (ZYLOPRIM) 100 MG tablet Take 200 mg by mouth daily.    Historical Provider, MD  amLODipine (NORVASC) 10 MG tablet Take 1 tablet (10 mg total) by mouth daily. 09/14/16   Argentina Donovan, PA-C  colchicine 0.6 MG tablet Take 1 tablet (0.6 mg total) by mouth 2 (two) times daily. 07/02/15   Billy Fischer, MD  Elbasvir-Grazoprevir (ZEPATIER) 50-100 MG TABS Take 1 tablet by mouth daily. Patient not taking: Reported on 09/14/2016 06/04/16   Thayer Headings, MD  furosemide (LASIX) 20 MG tablet Take 1 tablet (20 mg total) by mouth 2 (two) times daily. 09/14/16   Argentina Donovan, PA-C  labetalol (NORMODYNE) 100 MG tablet Take 2 tablets (200 mg total) by mouth 3 (three) times daily. 09/14/16   Levada Dy  M McClung, PA-C  mometasone-formoterol (DULERA) 200-5 MCG/ACT AERO Inhale 2 puffs into the lungs 2 (two) times daily. 06/08/16   Reyne Dumas, MD  traMADol (ULTRAM) 50 MG tablet Take 1 tablet (50 mg total) by mouth every 6 (six) hours as needed. Patient not taking: Reported on 09/03/2016 04/04/15   Gregor Hams, MD     Objective:  EXAM:   Vitals:   09/14/16 1006  BP: 131/89  Pulse: 68  Resp: 16  Temp: 97.8 F (36.6 C)  TempSrc: Oral  SpO2: 96%  Weight: 200 lb 9.6 oz (91 kg)    General appearance : A&OX3. NAD. Non-toxic-appearing HEENT: Atraumatic and Normocephalic.  PERRLA. EOM intact.  TM clear B. Mouth-MMM, post  pharynx WNL w/o erythema, No PND. Neck: supple, no JVD. No cervical lymphadenopathy. No thyromegaly Chest/Lungs:  Breathing-non-labored, Good air entry bilaterally, Mild wheezing L lung throughout including at base.  No rales or rhonchi CVS: S1 S2 regular, no murmurs, gallops, rubs  Extremities: Bilateral Lower Ext shows minimal to no edema today, both legs are warm to touch with = pulse throughout Neurology:  CN II-XII grossly intact, Non focal.   Psych:  TP linear. J/I WNL. Normal speech. Appropriate eye contact and affect.  Skin:  No Rash  Data Review Lab Results  Component Value Date   HGBA1C  02/21/2009    5.3 (NOTE) The ADA recommends the following therapeutic goal for glycemic control related to Hgb A1c measurement: Goal of therapy: <6.5 Hgb A1c  Reference: American Diabetes Association: Clinical Practice Recommendations 2010, Diabetes Care, 2010, 33: (Suppl  1).     Assessment & Plan   1. CKD (chronic kidney disease) stage 3, GFR 30-59 ml/min Obtain ROI for [facility] and Dr. Macky Lower office - Basic metabolic panel  2. Hypertension, uncontrolled Much improved-Continue current medications - Basic metabolic panel  3. Newly diagnosed congestive heart failure (HCC)-recently started on Lasix - Basic metabolic panel May need cardiology referral-he is working on getting his medicaid reinstated and wants to defer at this time.  This is reasonable as he is stable and symptoms are improving, but it would be beneficial to have cardiology follow as well.    ROI for [facility]/Dr Justin Mend and Dr. Ollen Gross Blount's office(Dr. Kennon Holter is deceased)  Patient have been counseled extensively about nutrition and exercise  Return in about 2 weeks (around 09/28/2016) for establish with PCP.  The patient was given clear instructions to go to ER or return to medical center if symptoms don't improve, worsen or new problems develop. The patient verbalized understanding. The patient was told to  call to get lab results if they haven't heard anything in the next week.     Freeman Caldron, PA-C [facility] and [place], [place]   09/14/2016, 10:58 AMPatient ID: DARRYL WILLNER, male   DOB: [birth year], 55 y.o.   MRN: [id number]

## 2016-09-14 NOTE — Progress Notes (Signed)
Pt is in the office today for ED follow up Pt states he is not in any pain Pt states he is taking medication without difficulty Pt states bp is not coming down Pt states the kidney doctor wants his bp no higher than 120/80

## 2016-09-25 MED FILL — LABETALOL HCL 100 MG TABLET: 100 | 30 days supply | Qty: 180 | Fill #0

## 2016-09-25 MED FILL — AMLODIPINE BESYLATE 10 MG T: 10 | 30 days supply | Qty: 30 | Fill #0

## 2016-10-23 ENCOUNTER — Ambulatory Visit (HOSPITAL_COMMUNITY)
Admission: EM | Admit: 2016-10-23 | Discharge: 2016-10-23 | Disposition: A | Discharge status: Home / Self Care | Payer: Self-pay | Attending: Family Medicine | Admitting: Family Medicine

## 2016-10-23 ENCOUNTER — Encounter (HOSPITAL_COMMUNITY): Payer: Self-pay | Admitting: Emergency Medicine

## 2016-10-23 DIAGNOSIS — R252 Cramp and spasm: Secondary | ICD-10-CM | POA: Insufficient documentation

## 2016-10-23 DIAGNOSIS — M79604 Pain in right leg: Secondary | ICD-10-CM | POA: Insufficient documentation

## 2016-10-23 DIAGNOSIS — Z87891 Personal history of nicotine dependence: Secondary | ICD-10-CM | POA: Insufficient documentation

## 2016-10-23 DIAGNOSIS — Z79899 Other long term (current) drug therapy: Secondary | ICD-10-CM | POA: Insufficient documentation

## 2016-10-23 DIAGNOSIS — N390 Urinary tract infection, site not specified: Secondary | ICD-10-CM | POA: Insufficient documentation

## 2016-10-23 DIAGNOSIS — M109 Gout, unspecified: Secondary | ICD-10-CM | POA: Insufficient documentation

## 2016-10-23 DIAGNOSIS — M79605 Pain in left leg: Secondary | ICD-10-CM | POA: Insufficient documentation

## 2016-10-23 LAB — POCT URINALYSIS DIP (DEVICE)
BILIRUBIN URINE: NEGATIVE
GLUCOSE, UA: NEGATIVE mg/dL
KETONES UR: NEGATIVE mg/dL
Nitrite: NEGATIVE
Urobilinogen, UA: 0.2 mg/dL (ref 0.0–1.0)
pH: 5.5 (ref 5.0–8.0)

## 2016-10-23 MED ORDER — CEPHALEXIN 500 MG PO CAPS: 500.0000 mg | ORAL_CAPSULE | Freq: Four times a day (QID) | ORAL | 0 refills | Status: DC

## 2016-10-23 MED ORDER — COLCHICINE 0.6 MG PO TABS: ORAL_TABLET | ORAL | 0 refills | Status: DC

## 2016-10-23 NOTE — Discharge Instructions (Signed)
Medications as directed. Make sure you are drinking increase amount of water. When taking your gout medicine taken with food. Performed the muscle stretches as we discussed and apply heat to the muscles cramp. Follow-up with your primary care doctor as needed.

## 2016-10-23 NOTE — ED Triage Notes (Signed)
The patient presented to the [facility] with multiple complaints.  The patient reported that last night he had severe cramps in his thighs.  The patient also reported that he has been having dysuria with frequency and urgency for 4 days.  The patient further reported that he has a hx of gout and has the symptoms of a flare up.

## 2016-10-23 NOTE — ED Provider Notes (Signed)
CSN: [id number]     Arrival date & time 10/23/16  1938 History   First MD Initiated Contact with Patient 10/23/16 2035     Chief Complaint  Patient presents with  . Leg Pain  . Dysuria   (Consider location/radiation/quality/duration/timing/severity/associated sxs/prior Treatment) 55 year old male presents with complaints of dysuria and urinary frequency for 4 days. Is also complaining of pain in the right ankle medial aspect consistent with his previous gout flareups. Yesterday evening he developed cramps to his medial thighs after going to bed.      Past Medical History:  Diagnosis Date  . Chronic kidney disease   . COPD (chronic obstructive pulmonary disease) ([place])   . Hepatitis C   . Hypertension   . OSA (obstructive sleep apnea) 11/03/2015   Past Surgical History:  Procedure Laterality Date  . AV FISTULA PLACEMENT  09/12/2009   Left arm AVF   Family History  Problem Relation Age of Onset  . Hypertension Mother   . Diabetes Mother   . Colon cancer Neg Hx    Social History  Substance Use Topics  . Smoking status: Former Smoker    Packs/day: 0.10    Types: Cigarettes    Quit date: 04/02/2011  . Smokeless tobacco: Never Used     Comment: trying to quit  . Alcohol use 0.0 oz/week     Comment: occasional    Review of Systems  Constitutional: Negative.  Negative for fever.  HENT: Negative.   Respiratory: Negative.   Cardiovascular: Negative for chest pain, palpitations and leg swelling.  Gastrointestinal: Negative.   Genitourinary: Positive for dysuria and frequency. Negative for discharge.  Musculoskeletal:       As per history of present illness  Neurological: Negative.     Allergies  Naproxen  Home Medications   Prior to Admission medications   Medication Sig Start Date End Date Taking? Authorizing Provider  allopurinol (ZYLOPRIM) 100 MG tablet Take 200 mg by mouth daily.   Yes Historical Provider, MD  amLODipine (NORVASC) 10 MG tablet Take 1 tablet  (10 mg total) by mouth daily. 09/14/16  Yes Dionne Bucy McClung, PA-C  furosemide (LASIX) 20 MG tablet Take 1 tablet (20 mg total) by mouth 2 (two) times daily. 09/14/16  Yes Dionne Bucy McClung, PA-C  labetalol (NORMODYNE) 100 MG tablet Take 2 tablets (200 mg total) by mouth 3 (three) times daily. 09/14/16  Yes Dionne Bucy McClung, PA-C  cephALEXin (KEFLEX) 500 MG capsule Take 1 capsule (500 mg total) by mouth 4 (four) times daily. 10/23/16   Janne Napoleon, NP  colchicine 0.6 MG tablet 1 tabs po x 1, then one tab po 1 hour later, then 1 q d. 10/23/16   Janne Napoleon, NP  mometasone-formoterol (DULERA) 200-5 MCG/ACT AERO Inhale 2 puffs into the lungs 2 (two) times daily. 06/08/16   Reyne Dumas, MD  traMADol (ULTRAM) 50 MG tablet Take 1 tablet (50 mg total) by mouth every 6 (six) hours as needed. Patient not taking: Reported on 09/03/2016 04/04/15   Gregor Hams, MD   Meds Ordered and Administered this Visit  Medications - No data to display  BP 138/88 (BP Location: Left Arm)   Pulse 80   Temp 98.1 F (36.7 C) (Oral)   Resp 18   SpO2 98%  No data found.   Physical Exam  Constitutional: He is oriented to person, place, and time. He appears well-developed and well-nourished. No distress.  HENT:  Head: Normocephalic and atraumatic.  Neck: Normal range  of motion. Neck supple.  Cardiovascular: Normal rate.   Pulmonary/Chest: Effort normal.  Musculoskeletal: Normal range of motion. He exhibits no edema or tenderness.  Left ankle with minimal swelling to the medial aspect, minor erythema and increased warmth. Mild tenderness. No lymphangitis. No cellulitis. Skin intact.  Lymphadenopathy:    He has no cervical adenopathy.  Neurological: He is alert and oriented to person, place, and time.  Skin: Skin is warm and dry. No rash noted.  Psychiatric: He has a normal mood and affect.  Nursing note and vitals reviewed.   Urgent Care Course   Clinical Course     Procedures (including critical care  time)  Labs Review Labs Reviewed  POCT URINALYSIS DIP (DEVICE) - Abnormal; Notable for the following:       Result Value   Hgb urine dipstick MODERATE (*)    Protein, ur >=300 (*)    Leukocytes, UA SMALL (*)    All other components within normal limits  URINE CULTURE  URINE CYTOLOGY ANCILLARY ONLY    Imaging Review No results found.   Visual Acuity Review  Right Eye Distance:   Left Eye Distance:   Bilateral Distance:    Right Eye Near:   Left Eye Near:    Bilateral Near:         MDM   1. Lower urinary tract infectious disease   2. Bilateral leg cramps   3. Acute gout of right ankle, unspecified cause    Medications as directed. Make sure you are drinking increase amount of water. When taking your gout medicine taken with food. Performed the muscle stretches as we discussed and apply heat to the muscles cramp. Follow-up with your primary care doctor as needed. Meds ordered this encounter  Medications  . colchicine 0.6 MG tablet    Sig: 1 tabs po x 1, then one tab po 1 hour later, then 1 q d.    Dispense:  6 tablet    Refill:  0    Order Specific Question:   Supervising Provider    Answer:   Billy Fischer [phone]  . cephALEXin (KEFLEX) 500 MG capsule    Sig: Take 1 capsule (500 mg total) by mouth 4 (four) times daily.    Dispense:  28 capsule    Refill:  0    Order Specific Question:   Supervising Provider    Answer:   Billy Fischer [[id number]]       Janne Napoleon, NP 10/23/16 [phone]

## 2016-10-24 LAB — URINE CYTOLOGY ANCILLARY ONLY
Chlamydia: POSITIVE — AB
Neisseria Gonorrhea: POSITIVE — AB
TRICH (WINDOWPATH): NEGATIVE

## 2016-10-25 ENCOUNTER — Ambulatory Visit (HOSPITAL_COMMUNITY)
Admission: EM | Admit: 2016-10-25 | Discharge: 2016-10-25 | Disposition: A | Discharge status: Home / Self Care | Payer: Self-pay | Attending: Emergency Medicine | Admitting: Emergency Medicine

## 2016-10-25 ENCOUNTER — Encounter (HOSPITAL_COMMUNITY): Payer: Self-pay | Admitting: Emergency Medicine

## 2016-10-25 DIAGNOSIS — Z202 Contact with and (suspected) exposure to infections with a predominantly sexual mode of transmission: Secondary | ICD-10-CM

## 2016-10-25 DIAGNOSIS — R369 Urethral discharge, unspecified: Secondary | ICD-10-CM

## 2016-10-25 LAB — URINE CULTURE: Culture: <10000 — AB

## 2016-10-25 MED ORDER — LIDOCAINE HCL (PF) 1 % IJ SOLN
INTRAMUSCULAR | Status: AC
  Filled 2016-10-25: qty 2

## 2016-10-25 MED ORDER — AZITHROMYCIN 250 MG PO TABS
ORAL_TABLET | ORAL | Status: AC
  Filled 2016-10-25: qty 4

## 2016-10-25 MED ORDER — AZITHROMYCIN 250 MG PO TABS
1000.0000 mg | ORAL_TABLET | Freq: Once | ORAL | Status: AC
  Administered 2016-10-25: 1000 mg via ORAL

## 2016-10-25 MED ORDER — CEFTRIAXONE SODIUM 250 MG IJ SOLR
INTRAMUSCULAR | Status: AC
  Filled 2016-10-25: qty 250

## 2016-10-25 MED ORDER — CEFTRIAXONE SODIUM 250 MG IJ SOLR
250.0000 mg | Freq: Once | INTRAMUSCULAR | Status: AC
  Administered 2016-10-25: 250 mg via INTRAMUSCULAR

## 2016-10-25 MED FILL — CEPHALEXIN 500 MG CAPSULE: 500 | 7 days supply | Qty: 28 | Fill #0

## 2016-10-25 MED FILL — COLCHICINE 0.6 MG TABLET: 0.6 | 5 days supply | Qty: 6 | Fill #0

## 2016-10-25 NOTE — ED Triage Notes (Signed)
Pt is here to get treated for pos Gon/Chlam  Still having dysuria and penile d/c  Reports he has notified partner(s)  A&O x4... NAD

## 2016-10-25 NOTE — ED Provider Notes (Signed)
CSN: [id number]     Arrival date & time 10/25/16  1940 History   None    Chief Complaint  Patient presents with  . Exposure to STD   (Consider location/radiation/quality/duration/timing/severity/associated sxs/prior Treatment) HPI : Pt for FU visit from yesterday where he was tested and resulted positive for GC and Chlamydia. Reports had unprotected sexual encounter and currently having yellow thick penile discharge. Pt in clinic for Tx.  Past Medical History:  Diagnosis Date  . Chronic kidney disease   . COPD (chronic obstructive pulmonary disease) ([place])   . Hepatitis C   . Hypertension   . OSA (obstructive sleep apnea) 11/03/2015   Past Surgical History:  Procedure Laterality Date  . AV FISTULA PLACEMENT  09/12/2009   Left arm AVF   Family History  Problem Relation Age of Onset  . Hypertension Mother   . Diabetes Mother   . Colon cancer Neg Hx    Social History  Substance Use Topics  . Smoking status: Former Smoker    Packs/day: 0.10    Types: Cigarettes    Quit date: 04/02/2011  . Smokeless tobacco: Never Used     Comment: trying to quit  . Alcohol use 0.0 oz/week     Comment: occasional    Review of Systems  Genitourinary: Positive for discharge.    Allergies  Naproxen  Home Medications   Prior to Admission medications   Medication Sig Start Date End Date Taking? Authorizing Provider  allopurinol (ZYLOPRIM) 100 MG tablet Take 200 mg by mouth daily.   Yes Historical Provider, MD  amLODipine (NORVASC) 10 MG tablet Take 1 tablet (10 mg total) by mouth daily. 09/14/16  Yes Dionne Bucy McClung, PA-C  cephALEXin (KEFLEX) 500 MG capsule Take 1 capsule (500 mg total) by mouth 4 (four) times daily. 10/23/16  Yes Janne Napoleon, NP  colchicine 0.6 MG tablet 1 tabs po x 1, then one tab po 1 hour later, then 1 q d. 10/23/16  Yes Janne Napoleon, NP  furosemide (LASIX) 20 MG tablet Take 1 tablet (20 mg total) by mouth 2 (two) times daily. 09/14/16  Yes Dionne Bucy McClung, PA-C   labetalol (NORMODYNE) 100 MG tablet Take 2 tablets (200 mg total) by mouth 3 (three) times daily. 09/14/16  Yes Dionne Bucy McClung, PA-C  mometasone-formoterol (DULERA) 200-5 MCG/ACT AERO Inhale 2 puffs into the lungs 2 (two) times daily. 06/08/16  Yes Reyne Dumas, MD  traMADol (ULTRAM) 50 MG tablet Take 1 tablet (50 mg total) by mouth every 6 (six) hours as needed. 04/04/15   Gregor Hams, MD   Meds Ordered and Administered this Visit   Medications  azithromycin (ZITHROMAX) tablet 1,000 mg (not administered)  cefTRIAXone (ROCEPHIN) injection 250 mg (not administered)    BP 155/97 (BP Location: Left Arm)   Pulse 80   Temp 97.9 F (36.6 C) (Oral)   Resp 20   SpO2 98%  No data found.   Physical Exam  Abdominal: Bowel sounds are normal.  Genitourinary: Penis normal. No penile tenderness.  Positive scant yellow penile discharge.  Urgent Care Course   Clinical Course     Procedures (including critical care time)  Labs Review Labs Reviewed - No data to display  Imaging Review No results found.   Visual Acuity Review  Right Eye Distance:   Left Eye Distance:   Bilateral Distance:    Right Eye Near:   Left Eye Near:    Bilateral Near:  MDM   1. Penile discharge   2. STD exposure    Pt Tx with Rocephin 250 mg and Azithromycin 1gm. Educated on safe sex practices    Khalif Stender [place], NP 10/25/16 2008

## 2016-11-01 MED FILL — LABETALOL HCL 100 MG TABLET: 100 | 30 days supply | Qty: 180 | Fill #1

## 2016-11-01 MED FILL — ?ALLOPURINOL 100MG TABLET: 100 | 30 days supply | Qty: 60 | Fill #0

## 2016-11-06 MED FILL — ?AMLODIPINE BESYLATE 10 MG: 10 | 30 days supply | Qty: 30 | Fill #1

## 2016-11-21 ENCOUNTER — Ambulatory Visit: Payer: Self-pay | Admitting: Family Medicine

## 2016-12-07 ENCOUNTER — Ambulatory Visit: Payer: Self-pay | Attending: Family Medicine | Admitting: Family Medicine

## 2016-12-07 ENCOUNTER — Encounter: Payer: Self-pay | Admitting: Family Medicine

## 2016-12-07 VITALS — BP 129/77 | HR 74 | Temp 97.7°F | Ht 73.0 in | Wt 204.0 lb

## 2016-12-07 DIAGNOSIS — Z202 Contact with and (suspected) exposure to infections with a predominantly sexual mode of transmission: Secondary | ICD-10-CM

## 2016-12-07 DIAGNOSIS — I10 Essential (primary) hypertension: Secondary | ICD-10-CM

## 2016-12-07 DIAGNOSIS — Z720 Tobacco use: Secondary | ICD-10-CM

## 2016-12-07 DIAGNOSIS — I129 Hypertensive chronic kidney disease with stage 1 through stage 4 chronic kidney disease, or unspecified chronic kidney disease: Secondary | ICD-10-CM | POA: Insufficient documentation

## 2016-12-07 DIAGNOSIS — M1A00X0 Idiopathic chronic gout, unspecified site, without tophus (tophi): Secondary | ICD-10-CM

## 2016-12-07 DIAGNOSIS — J438 Other emphysema: Secondary | ICD-10-CM

## 2016-12-07 DIAGNOSIS — R7989 Other specified abnormal findings of blood chemistry: Secondary | ICD-10-CM

## 2016-12-07 DIAGNOSIS — G4733 Obstructive sleep apnea (adult) (pediatric): Secondary | ICD-10-CM

## 2016-12-07 DIAGNOSIS — Z716 Tobacco abuse counseling: Secondary | ICD-10-CM | POA: Insufficient documentation

## 2016-12-07 DIAGNOSIS — J449 Chronic obstructive pulmonary disease, unspecified: Secondary | ICD-10-CM | POA: Insufficient documentation

## 2016-12-07 DIAGNOSIS — M109 Gout, unspecified: Secondary | ICD-10-CM | POA: Insufficient documentation

## 2016-12-07 DIAGNOSIS — Z87891 Personal history of nicotine dependence: Secondary | ICD-10-CM | POA: Insufficient documentation

## 2016-12-07 DIAGNOSIS — Z888 Allergy status to other drugs, medicaments and biological substances status: Secondary | ICD-10-CM | POA: Insufficient documentation

## 2016-12-07 DIAGNOSIS — F17200 Nicotine dependence, unspecified, uncomplicated: Secondary | ICD-10-CM | POA: Insufficient documentation

## 2016-12-07 DIAGNOSIS — B1920 Unspecified viral hepatitis C without hepatic coma: Secondary | ICD-10-CM | POA: Insufficient documentation

## 2016-12-07 DIAGNOSIS — A64 Unspecified sexually transmitted disease: Secondary | ICD-10-CM

## 2016-12-07 DIAGNOSIS — N183 Chronic kidney disease, stage 3 unspecified: Secondary | ICD-10-CM

## 2016-12-07 LAB — COMPLETE METABOLIC PANEL WITH GFR
ALBUMIN: 3.6 g/dL (ref 3.6–5.1)
ALK PHOS: 75 U/L (ref 40–115)
ALT: 43 U/L (ref 9–46)
AST: 61 U/L — ABNORMAL HIGH (ref 10–35)
BILIRUBIN TOTAL: 0.5 mg/dL (ref 0.2–1.2)
BUN: 40 mg/dL — ABNORMAL HIGH (ref 7–25)
CALCIUM: 9.2 mg/dL (ref 8.6–10.3)
CO2: 25 mmol/L (ref 20–31)
CREATININE: 2.78 mg/dL — AB (ref 0.70–1.33)
Chloride: 106 mmol/L (ref 98–110)
GFR, EST AFRICAN AMERICAN: 29 mL/min — AB (ref >=60)
GFR, EST NON AFRICAN AMERICAN: 25 mL/min — AB (ref >=60)
Glucose, Bld: 96 mg/dL (ref 65–99)
Potassium: 4 mmol/L (ref 3.5–5.3)
Sodium: 139 mmol/L (ref 135–146)
TOTAL PROTEIN: 7.2 g/dL (ref 6.1–8.1)

## 2016-12-07 LAB — LIPID PANEL W/REFLEX DIRECT LDL
CHOLESTEROL: 200 mg/dL — AB (ref <200)
HDL: 88 mg/dL (ref >40)
LDL-CHOLESTEROL: 89 mg/dL
Non-HDL Cholesterol (Calc): 112 mg/dL (ref <130)
Total CHOL/HDL Ratio: 2.3 Ratio (ref <5.0)
Triglycerides: 130 mg/dL (ref <150)

## 2016-12-07 MED ORDER — AMLODIPINE BESYLATE 10 MG PO TABS: 10.0000 mg | ORAL_TABLET | Freq: Every day | ORAL | 3 refills | Status: DC

## 2016-12-07 MED ORDER — MOMETASONE FURO-FORMOTEROL FUM 200-5 MCG/ACT IN AERO: 2.0000 | INHALATION_SPRAY | Freq: Two times a day (BID) | RESPIRATORY_TRACT | 5 refills | Status: DC

## 2016-12-07 MED ORDER — AZITHROMYCIN 250 MG PO TABS: ORAL_TABLET | ORAL | 0 refills | Status: DC

## 2016-12-07 MED ORDER — CEFTRIAXONE SODIUM 500 MG IJ SOLR
250.0000 mg | Freq: Once | INTRAMUSCULAR | Status: AC
  Administered 2016-12-07: 250 mg via INTRAMUSCULAR

## 2016-12-07 MED ORDER — FUROSEMIDE 20 MG PO TABS: 20.0000 mg | ORAL_TABLET | Freq: Two times a day (BID) | ORAL | 3 refills | Status: DC

## 2016-12-07 MED ORDER — COLCHICINE 0.6 MG PO TABS: ORAL_TABLET | ORAL | 1 refills | Status: DC

## 2016-12-07 MED ORDER — ALBUTEROL SULFATE (2.5 MG/3ML) 0.083% IN NEBU: 2.5000 mg | INHALATION_SOLUTION | Freq: Four times a day (QID) | RESPIRATORY_TRACT | 3 refills | Status: DC | PRN

## 2016-12-07 MED ORDER — ALBUTEROL SULFATE HFA 108 (90 BASE) MCG/ACT IN AERS: 2.0000 | INHALATION_SPRAY | Freq: Four times a day (QID) | RESPIRATORY_TRACT | 0 refills | Status: DC | PRN

## 2016-12-07 MED ORDER — LABETALOL HCL 100 MG PO TABS: 200.0000 mg | ORAL_TABLET | Freq: Three times a day (TID) | ORAL | 3 refills | Status: DC

## 2016-12-07 MED ORDER — ALLOPURINOL 100 MG PO TABS: 200.0000 mg | ORAL_TABLET | Freq: Every day | ORAL | 3 refills | Status: DC

## 2016-12-07 MED FILL — ?AMLODIPINE BESYLATE 10 MG: 10 | 30 days supply | Qty: 30 | Fill #0

## 2016-12-07 MED FILL — LABETALOL HCL 100 MG TABLET: 100 | 30 days supply | Qty: 180 | Fill #0

## 2016-12-07 NOTE — Progress Notes (Signed)
Subjective:  Patient ID: Johnny [name], male    DOB: July 23, 1961  Age: 56 y.o. MRN: [id number]  CC: Chronic Kidney Disease; Gout; Exposure to STD (exposed to gonorrhea/ syphillis- use condom but it broke); COPD; Hypertension; Sleep Apnea (needs a cpap machine); Hepatitis C; Shortness of Breath; and Cough (productive- yellow)   HPI Johnny [name] is a 55 year old male with a history of COPD, gout, stage III chronic kidney disease, hypertension, sleep apnea who presents today to establish care with me.  He complains of cough, SOB which is sometimes dry at other times productive of white sputum, continues to smoke. Uses his [employer] Mannan but does not have an MDI. Requests albuterol solution for his nebulizer.  Chronic kidney disease is followed by his nephrologist Dr. Justin Mend whom he sees later this month. Denies any gout flare. Compliant with his antihypertensive.  He does have a questionable history of congestive heart failure which he is uncertain about but he has no 2-D echo documented in the chart but does have an elevated BNP of 554.2 from 08/2016. Denies weight gain or pedal edema. He complains of being exposed to gonorrhea and syphilis by his sexual partner.   Past Medical History:  Diagnosis Date  . Chronic kidney disease   . COPD (chronic obstructive pulmonary disease) ([place])   . Hepatitis C   . Hypertension   . OSA (obstructive sleep apnea) 11/03/2015    Past Surgical History:  Procedure Laterality Date  . AV FISTULA PLACEMENT  09/12/2009   Left arm AVF    Allergies  Allergen Reactions  . Naproxen Other (See Comments)    Unknown     Outpatient Medications Prior to Visit  Medication Sig Dispense Refill  . allopurinol (ZYLOPRIM) 100 MG tablet Take 200 mg by mouth daily.    [name] Kitchen amLODipine (NORVASC) 10 MG tablet Take 1 tablet (10 mg total) by mouth daily. 30 tablet 2  . labetalol (NORMODYNE) 100 MG tablet Take 2 tablets (200 mg total) by mouth 3 (three) times daily. 180 tablet 2    . mometasone-formoterol (DULERA) 200-5 MCG/ACT AERO Inhale 2 puffs into the lungs 2 (two) times daily. 13 g 5  . traMADol (ULTRAM) 50 MG tablet Take 1 tablet (50 mg total) by mouth every 6 (six) hours as needed. (Patient not taking: Reported on 12/07/2016) 15 tablet 0  . cephALEXin (KEFLEX) 500 MG capsule Take 1 capsule (500 mg total) by mouth 4 (four) times daily. 28 capsule 0  . colchicine 0.6 MG tablet 1 tabs po x 1, then one tab po 1 hour later, then 1 q d. (Patient not taking: Reported on 12/07/2016) 6 tablet 0  . furosemide (LASIX) 20 MG tablet Take 1 tablet (20 mg total) by mouth 2 (two) times daily. (Patient not taking: Reported on 12/07/2016) 30 tablet 0   No facility-administered medications prior to visit.     ROS Review of Systems  Constitutional: Negative for activity change and appetite change.  HENT: Negative for sinus pressure and sore throat.   Eyes: Negative for visual disturbance.  Respiratory: Positive for cough. Negative for chest tightness and shortness of breath.   Cardiovascular: Negative for chest pain and leg swelling.  Gastrointestinal: Negative for abdominal distention, abdominal pain, constipation and diarrhea.  Endocrine: Negative.   Genitourinary: Negative for dysuria.  Musculoskeletal: Negative for joint swelling and myalgias.  Skin: Negative for rash.  Allergic/Immunologic: Negative.   Neurological: Negative for weakness, light-headedness and numbness.  Psychiatric/Behavioral: Negative for dysphoric mood and suicidal  ideas.    Objective:  BP 129/77 (BP Location: Right Arm, Patient Position: Sitting, Cuff Size: Small)   Pulse 74   Temp 97.7 F (36.5 C) (Oral)   Ht 6\' 1"  (1.854 m)   Wt 204 lb (92.5 kg)   SpO2 97%   BMI 26.91 kg/m   BP/Weight 12/07/2016 10/25/2016 37/34/2876  Systolic BP 811 572 620  Diastolic BP 77 97 88  Wt. (Lbs) 204 - -  BMI 26.91 - -      Physical Exam  Constitutional: He is oriented to person, place, and time. He appears  well-developed and well-nourished.  Cardiovascular: Normal rate, normal heart sounds and intact distal pulses.   No murmur heard. Pulmonary/Chest: Effort normal and breath sounds normal. He has no wheezes. He has no rales. He exhibits no tenderness.  Abdominal: Soft. Bowel sounds are normal. He exhibits no distension and no mass. There is no tenderness.  Musculoskeletal: Normal range of motion.  Neurological: He is alert and oriented to person, place, and time.     Assessment & Plan:   1. CKD (chronic kidney disease) stage 3, GFR 30-59 ml/min Avoid nephrotoxins Keep appointment with nephrology - COMPLETE METABOLIC PANEL WITH GFR - Lipid Panel w/reflex Direct LDL  2. Other emphysema ([place]) Advised to quit smoking-he is not ready at this time Added MDI to regimen due to persistent cough - mometasone-formoterol (DULERA) 200-5 MCG/ACT AERO; Inhale 2 puffs into the lungs 2 (two) times daily.  Dispense: 13 g; Refill: 5 - albuterol (PROVENTIL) (2.5 MG/3ML) 0.083% nebulizer solution; Take 3 mLs (2.5 mg total) by nebulization every 6 (six) hours as needed for wheezing or shortness of breath.  Dispense: 25 mL; Refill: 3 - albuterol (PROVENTIL HFA;VENTOLIN HFA) 108 (90 Base) MCG/ACT inhaler; Inhale 2 puffs into the lungs every 6 (six) hours as needed for wheezing or shortness of breath.  Dispense: 1 Inhaler; Refill: 0  3. Tobacco abuse Spent 3 minutes counseling on cessation  4. Elevated brain natriuretic peptide (BNP) level Will need to establish a diagnosis of CHF - furosemide (LASIX) 20 MG tablet; Take 1 tablet (20 mg total) by mouth 2 (two) times daily.  Dispense: 30 tablet; Refill: 3 - ECHOCARDIOGRAM COMPLETE; Future - Brain natriuretic peptide  5. Idiopathic chronic gout without tophus, unspecified site Stable - allopurinol (ZYLOPRIM) 100 MG tablet; Take 2 tablets (200 mg total) by mouth daily.  Dispense: 60 tablet; Refill: 3 - colchicine 0.6 MG tablet; 2 tabs po x 1, then one tab po 1  hour later prn Gout flare  Dispense: 30 tablet; Refill: 1  6. Exposure to sexually transmitted disease (STD) 250mg  IM Ceftriaxone administered - azithromycin (ZITHROMAX) 250 MG tablet; 4 tabs PO once  Dispense: 4 tablet; Refill: 0  7. Essential hypertension Controlled - amLODipine (NORVASC) 10 MG tablet; Take 1 tablet (10 mg total) by mouth daily.  Dispense: 30 tablet; Refill: 3 - labetalol (NORMODYNE) 100 MG tablet; Take 2 tablets (200 mg total) by mouth 3 (three) times daily.  Dispense: 180 tablet; Refill: 3  8. OSA (obstructive sleep apnea) Will address at next visit including referral for sleep studies   Meds ordered this encounter  Medications  . allopurinol (ZYLOPRIM) 100 MG tablet    Sig: Take 2 tablets (200 mg total) by mouth daily.    Dispense:  60 tablet    Refill:  3  . amLODipine (NORVASC) 10 MG tablet    Sig: Take 1 tablet (10 mg total) by mouth daily.  Dispense:  30 tablet    Refill:  3  . colchicine 0.6 MG tablet    Sig: 2 tabs po x 1, then one tab po 1 hour later prn Gout flare    Dispense:  30 tablet    Refill:  1  . furosemide (LASIX) 20 MG tablet    Sig: Take 1 tablet (20 mg total) by mouth 2 (two) times daily.    Dispense:  30 tablet    Refill:  3  . labetalol (NORMODYNE) 100 MG tablet    Sig: Take 2 tablets (200 mg total) by mouth 3 (three) times daily.    Dispense:  180 tablet    Refill:  3  . mometasone-formoterol (DULERA) 200-5 MCG/ACT AERO    Sig: Inhale 2 puffs into the lungs 2 (two) times daily.    Dispense:  13 g    Refill:  5  . albuterol (PROVENTIL) (2.5 MG/3ML) 0.083% nebulizer solution    Sig: Take 3 mLs (2.5 mg total) by nebulization every 6 (six) hours as needed for wheezing or shortness of breath.    Dispense:  25 mL    Refill:  3  . albuterol (PROVENTIL HFA;VENTOLIN HFA) 108 (90 Base) MCG/ACT inhaler    Sig: Inhale 2 puffs into the lungs every 6 (six) hours as needed for wheezing or shortness of breath.    Dispense:  1 Inhaler     Refill:  0  . azithromycin (ZITHROMAX) 250 MG tablet    Sig: 4 tabs PO once    Dispense:  4 tablet    Refill:  0    Follow-up: Return in about 2 months (around 02/04/2017) for Follow-up on 2-D echo and sleep apnea.   Arnoldo Morale MD

## 2016-12-07 NOTE — Progress Notes (Signed)
Needs refills on all meds

## 2016-12-08 LAB — BRAIN NATRIURETIC PEPTIDE: Brain Natriuretic Peptide: 44.6 pg/mL (ref <100)

## 2016-12-10 ENCOUNTER — Other Ambulatory Visit: Payer: Self-pay | Admitting: Family Medicine

## 2016-12-10 MED ORDER — ATORVASTATIN CALCIUM 20 MG PO TABS: 20.0000 mg | ORAL_TABLET | Freq: Every day | ORAL | 3 refills | Status: DC

## 2016-12-11 ENCOUNTER — Telehealth: Payer: Self-pay

## 2016-12-11 NOTE — Telephone Encounter (Signed)
Writer called patient to give him lab results and was unable to LVM because his VM was full.

## 2016-12-11 NOTE — Telephone Encounter (Signed)
-----   Message from Arnoldo Morale, MD sent at 12/10/2016 12:42 PM EST ----- Cholesterol is elevated; I have sent a prescription for atorvastatin to his pharmacy; advised on low-cholesterol diet. Kidney function has worsened slightly and he is advised to keep his appointment with his nephrologist.

## 2016-12-17 ENCOUNTER — Ambulatory Visit (HOSPITAL_COMMUNITY): Admission: RE | Admit: 2016-12-17 | Payer: Self-pay | Source: Ambulatory Visit

## 2016-12-17 ENCOUNTER — Telehealth: Payer: Self-pay

## 2016-12-17 NOTE — Telephone Encounter (Signed)
Writer called patient to discuss lab results.  LVM requesting patient to return call to discuss.

## 2016-12-17 NOTE — Telephone Encounter (Signed)
-----   Message from Arnoldo Morale, MD sent at 12/10/2016 12:42 PM EST ----- Cholesterol is elevated; I have sent a prescription for atorvastatin to his pharmacy; advised on low-cholesterol diet. Kidney function has worsened slightly and he is advised to keep his appointment with his nephrologist.

## 2016-12-28 ENCOUNTER — Telehealth: Payer: Self-pay | Admitting: *Deleted

## 2016-12-28 NOTE — Telephone Encounter (Signed)
-----   Message from Arnoldo Morale, MD sent at 12/10/2016 12:42 PM EST ----- Cholesterol is elevated; I have sent a prescription for atorvastatin to his pharmacy; advised on low-cholesterol diet. Kidney function has worsened slightly and he is advised to keep his appointment with his nephrologist.

## 2016-12-28 NOTE — Telephone Encounter (Signed)
MA informed patient of cholesterol level being elevated and to pick up atorvastatin from the [facility] pharmacy. Patient is also aware of kidney function being slightly worse and needing to keep his appointment with nephrologist. Medical Assistant left message on patient's home and cell voicemail. Voicemail states to give a call back to [place] with [facility] at [phone].

## 2017-01-10 MED FILL — **DULERA 200 MCG/5 MCG INHA: 200-5 MCG | 15 days supply | Qty: 1 | Fill #0

## 2017-01-10 MED FILL — AZITHROMYCIN 250 MG TABLET: 250 | 1 days supply | Qty: 4 | Fill #0

## 2017-01-10 MED FILL — COLCHICINE 0.6 MG TABLET: 0.6 | 18 days supply | Qty: 30 | Fill #0

## 2017-01-10 MED FILL — FUROSEMIDE 20 MG TABLET: 20 | 15 days supply | Qty: 30 | Fill #0

## 2017-01-10 MED FILL — VENTOLIN HFA 90 MCG INHALER: 108 (90 BAS | 25 days supply | Qty: 18 | Fill #0

## 2017-01-10 MED FILL — ATORVASTATIN 20 MG TABLET: 20 | 30 days supply | Qty: 30 | Fill #0

## 2017-01-10 MED FILL — ALBUTEROL 0.083% INHAL SOLN: (2.5 MG/3ML | 7 days supply | Qty: 90 | Fill #0

## 2017-01-10 MED FILL — ALLOPURINOL 100 MG TABLET: 100 | 30 days supply | Qty: 60 | Fill #0

## 2017-01-30 ENCOUNTER — Telehealth: Payer: Self-pay | Admitting: Family Medicine

## 2017-01-30 NOTE — Telephone Encounter (Signed)
Pt states he received a call from PCP's nurse. Pt returning call. Did not say what it was in reference to

## 2017-02-04 MED FILL — AMLODIPINE BESYLATE 10 MG T: 10 | 30 days supply | Qty: 30 | Fill #1

## 2017-02-04 MED FILL — LABETALOL HCL 100 MG TABLET: 100 | 30 days supply | Qty: 180 | Fill #1

## 2017-02-07 NOTE — Progress Notes (Signed)
Writer sent a letter and lab results to patient after not being able to contact him by phone.

## 2017-03-07 ENCOUNTER — Other Ambulatory Visit: Payer: Self-pay | Admitting: Family Medicine

## 2017-03-07 DIAGNOSIS — J438 Other emphysema: Secondary | ICD-10-CM

## 2017-04-22 MED FILL — ATORVASTATIN 20 MG TABLET: 20 | 30 days supply | Qty: 30 | Fill #1

## 2017-04-22 MED FILL — AMLODIPINE BESYLATE 10 MG T: 10 | 30 days supply | Qty: 30 | Fill #2

## 2017-04-22 MED FILL — LABETALOL HCL 100 MG TABLET: 100 | 30 days supply | Qty: 180 | Fill #2

## 2017-06-03 MED FILL — LABETALOL HCL 100 MG TABLET: 100 | 30 days supply | Qty: 180 | Fill #3

## 2017-06-03 MED FILL — AMLODIPINE BESYLATE 10 MG T: 10 | 30 days supply | Qty: 30 | Fill #3

## 2017-06-17 ENCOUNTER — Encounter: Payer: Self-pay | Admitting: Family Medicine

## 2017-06-17 ENCOUNTER — Ambulatory Visit: Payer: Self-pay | Attending: Family Medicine | Admitting: Family Medicine

## 2017-06-17 VITALS — BP 117/70 | HR 76 | Temp 98.3°F | Ht 73.0 in | Wt 192.4 lb

## 2017-06-17 DIAGNOSIS — G4733 Obstructive sleep apnea (adult) (pediatric): Secondary | ICD-10-CM | POA: Insufficient documentation

## 2017-06-17 DIAGNOSIS — J449 Chronic obstructive pulmonary disease, unspecified: Secondary | ICD-10-CM | POA: Insufficient documentation

## 2017-06-17 DIAGNOSIS — Z79899 Other long term (current) drug therapy: Secondary | ICD-10-CM | POA: Insufficient documentation

## 2017-06-17 DIAGNOSIS — B1920 Unspecified viral hepatitis C without hepatic coma: Secondary | ICD-10-CM | POA: Insufficient documentation

## 2017-06-17 DIAGNOSIS — Z113 Encounter for screening for infections with a predominantly sexual mode of transmission: Secondary | ICD-10-CM | POA: Insufficient documentation

## 2017-06-17 DIAGNOSIS — N183 Chronic kidney disease, stage 3 (moderate): Secondary | ICD-10-CM | POA: Insufficient documentation

## 2017-06-17 DIAGNOSIS — J36 Peritonsillar abscess: Secondary | ICD-10-CM | POA: Insufficient documentation

## 2017-06-17 DIAGNOSIS — J029 Acute pharyngitis, unspecified: Secondary | ICD-10-CM

## 2017-06-17 DIAGNOSIS — M109 Gout, unspecified: Secondary | ICD-10-CM | POA: Insufficient documentation

## 2017-06-17 DIAGNOSIS — I129 Hypertensive chronic kidney disease with stage 1 through stage 4 chronic kidney disease, or unspecified chronic kidney disease: Secondary | ICD-10-CM | POA: Insufficient documentation

## 2017-06-17 LAB — POCT RAPID STREP A (OFFICE): Rapid Strep A Screen: NEGATIVE

## 2017-06-17 MED ORDER — AMOXICILLIN-POT CLAVULANATE 875-125 MG PO TABS: 1.0000 | ORAL_TABLET | Freq: Two times a day (BID) | ORAL | 0 refills | Status: DC

## 2017-06-17 MED ORDER — PREDNISONE 20 MG PO TABS: 20.0000 mg | ORAL_TABLET | Freq: Every day | ORAL | 0 refills | Status: DC

## 2017-06-17 MED FILL — ?PREDNISONE 20MG TABLET: 20 | 5 days supply | Qty: 5 | Fill #0

## 2017-06-17 MED FILL — AMOX-CLAV 875-125 MG TABLET: 875-125 | 10 days supply | Qty: 20 | Fill #0

## 2017-06-17 NOTE — Progress Notes (Signed)
Subjective:  Patient ID: Johnny [name], male    DOB: 11/26/1960  Age: 56 y.o. MRN: [id number]  CC: Sore Throat   HPI Johnny [name] is a 56 year old male with a history of COPD, gout, stage III chronic kidney disease, hypertension, sleep apneaWho presents today with a two-week history of sore throat which of recent started radiating to his left ear. He complains of inability to swallow his saliva, dysphagia, pain in speaking. Denies fever, sinus tenderness, myalgias and denies shortness of breath.  He also recently had a new sexual partner would like to be screened for sexually transmitted diseases.  Past Medical History:  Diagnosis Date  . Chronic kidney disease   . COPD (chronic obstructive pulmonary disease) ([place])   . Hepatitis C   . Hypertension   . OSA (obstructive sleep apnea) 11/03/2015    Past Surgical History:  Procedure Laterality Date  . AV FISTULA PLACEMENT  09/12/2009   Left arm AVF    Allergies  Allergen Reactions  . Naproxen Other (See Comments)    Unknown     Outpatient Medications Prior to Visit  Medication Sig Dispense Refill  . albuterol (PROVENTIL) (2.5 MG/3ML) 0.083% nebulizer solution Take 3 mLs (2.5 mg total) by nebulization every 6 (six) hours as needed for wheezing or shortness of breath. 25 mL 3  . allopurinol (ZYLOPRIM) 100 MG tablet Take 2 tablets (200 mg total) by mouth daily. 60 tablet 3  . amLODipine (NORVASC) 10 MG tablet Take 1 tablet (10 mg total) by mouth daily. 30 tablet 3  . atorvastatin (LIPITOR) 20 MG tablet Take 1 tablet (20 mg total) by mouth daily. 30 tablet 3  . colchicine 0.6 MG tablet 2 tabs po x 1, then one tab po 1 hour later prn Gout flare 30 tablet 1  . furosemide (LASIX) 20 MG tablet Take 1 tablet (20 mg total) by mouth 2 (two) times daily. 30 tablet 3  . labetalol (NORMODYNE) 100 MG tablet Take 2 tablets (200 mg total) by mouth 3 (three) times daily. 180 tablet 3  . VENTOLIN HFA 108 (90 Base) MCG/ACT inhaler INHALE 2  PUFFS INTO THE LUNGS EVERY 6 HOURS AS NEEDED FOR WHEEZING OR SHORTNESS OF BREATH. 18 g 1  . azithromycin (ZITHROMAX) 250 MG tablet 4 tabs PO once (Patient not taking: Reported on 06/17/2017) 4 tablet 0  . mometasone-formoterol (DULERA) 200-5 MCG/ACT AERO Inhale 2 puffs into the lungs 2 (two) times daily. (Patient not taking: Reported on 06/17/2017) 13 g 5  . traMADol (ULTRAM) 50 MG tablet Take 1 tablet (50 mg total) by mouth every 6 (six) hours as needed. (Patient not taking: Reported on 12/07/2016) 15 tablet 0   No facility-administered medications prior to visit.     ROS Review of Systems  Constitutional: Negative for activity change and appetite change.  HENT: Positive for sore throat. Negative for drooling and sinus pressure.   Eyes: Negative for visual disturbance.  Respiratory: Negative for cough, chest tightness and shortness of breath.   Cardiovascular: Negative for chest pain and leg swelling.  Gastrointestinal: Negative for abdominal distention, abdominal pain, constipation and diarrhea.  Endocrine: Negative.   Genitourinary: Negative for dysuria.  Musculoskeletal: Negative for joint swelling and myalgias.  Skin: Negative for rash.  Allergic/Immunologic: Negative.   Neurological: Negative for weakness, light-headedness and numbness.  Psychiatric/Behavioral: Negative for dysphoric mood and suicidal ideas.    Objective:  BP 117/70   Pulse 76   Temp 98.3 F (36.8 C) (Oral)  Ht 6\' 1"  (1.854 m)   Wt 192 lb 6.4 oz (87.3 kg)   SpO2 93%   BMI 25.38 kg/m   BP/Weight 06/17/2017 12/07/2016 46/27/0350  Systolic BP 093 818 299  Diastolic BP 70 77 97  Wt. (Lbs) 192.4 204 -  BMI 25.38 26.91 -      Physical Exam  Constitutional: He is oriented to person, place, and time. He appears well-developed and well-nourished.  HENT:  Severely edematous and erythematous left tonsil, associated trismus and limited ability to open the oral cavity. Pooling of saliva in the mouth No sinus  tenderness Bilateral tympanic membranes-normal  Cardiovascular: Normal rate, normal heart sounds and intact distal pulses.   No murmur heard. Pulmonary/Chest: Effort normal and breath sounds normal. He has no wheezes. He has no rales. He exhibits no tenderness.  Abdominal: Soft. Bowel sounds are normal. He exhibits no distension and no mass. There is no tenderness.  Musculoskeletal: Normal range of motion.  Neurological: He is alert and oriented to person, place, and time.  Skin: Skin is warm and dry.  Psychiatric: He has a normal mood and affect.     Assessment & Plan:   1. Peritonsillar cellulitis No evidence of airway compromise, no indication for surgical intervention at this time Rapid strep negative Commence on oral antibiotic and discussed warning signs with the patient which would necessitate ED presentation. - Strep A DNA probe - CBC - amoxicillin-clavulanate (AUGMENTIN) 875-125 MG tablet; Take 1 tablet by mouth 2 (two) times daily.  Dispense: 20 tablet; Refill: 0  2. Sore throat - POCT rapid strep A  3. Screening for STD (sexually transmitted disease) safe sexual practices - Urine cytology ancillary only - HIV antibody (with reflex) - RPR - HSV(herpes smplx)abs-1+2(IgG+IgM)-bld   Meds ordered this encounter  Medications  . amoxicillin-clavulanate (AUGMENTIN) 875-125 MG tablet    Sig: Take 1 tablet by mouth 2 (two) times daily.    Dispense:  20 tablet    Refill:  0    Follow-up: Return in about 2 weeks (around 07/01/2017) for Follow-up of chronic medical conditions.   This note has been created with Surveyor, quantity. Any transcriptional errors are unintentional.     Arnoldo Morale MD

## 2017-06-17 NOTE — Addendum Note (Signed)
Addended by: Arnoldo Morale on: 06/17/2017 12:38 PM   Modules accepted: Orders

## 2017-06-17 NOTE — Patient Instructions (Signed)
Peritonsillar Cellulitis  Peritonsillar cellulitis is an infection around a tonsil that results in a severe sore throat. If the condition is not treated, pus can collect in the throat.  What are the causes?  Peritonsillar cellulitis is usually caused by a combination of several strains of bacteria.  What increases the risk?  This condition is more likely to develop in:   People who have frequent tonsil infections.   People who take antibiotic medicines frequently.   People who smoke.    What are the signs or symptoms?  Early symptoms of this condition include:   A fever.   Chills.   Soreness on one side of the throat.   Pain in one ear.   Pain when swallowing.   Tiredness.    Later symptoms include:   Severe pain when swallowing.   Drooling.   Trouble opening the mouth wide.   Bad breath.   Changes in the voice.    How is this diagnosed?  This condition may be diagnosed based on symptoms, a physical exam, and a test in which a sample of fluid from the throat is tested (throat culture). You may also have a blood test.  How is this treated?  This condition is usually treated with antibiotic medicines. You may need to take these medicines by mouth or through an IV tube. Additional treatment may include:   Medicines for pain, fever, or swelling. Some medicines may be given through an IV tube.   A procedure to drain a collection of pus (abscess).   Surgery to remove the tonsils (tonsillectomy). This may be done if you get this condition often.    Follow these instructions at home:  Eating and drinking   If it is hard to swallow, try switching to a liquid or soft-food diet until you get better.   Drink enough fluid to keep your urine clear or pale yellow.  Medicines   Take your antibiotic medicine as told by your health care provider. Do not stop taking the antibiotic even if you start to feel better.   Take over-the-counter and prescription medicines only as told by your health care  provider.  Activity   Rest and get plenty of sleep.   Return to work or school as directed by your health care provider.  General instructions   Do not smoke.   Keep all follow-up visits as told by your health care provider. This is important.   To help ease pain and swelling, gargle with a salt-water mixture 3-4 times per day or as needed. To make a salt-water mixture, completely dissolve -1 tsp of salt in 1 cup of warm water.  Contact a health care provider if:   Your swelling gets worse.   You have difficulty swallowing.   You are unable to take your antibiotic.   You have a fever that does not improve after you take medicine.   Your voice changes.  Get help right away if:   You have trouble breathing.   Your pain gets worse.   You see pus around or near your tonsils.   You cough up bloody spit.   You are unable to swallow.   You are drooling.  This information is not intended to replace advice given to you by your health care provider. Make sure you discuss any questions you have with your health care provider.  Document Released: 01/16/2010 Document Revised: 03/29/2016 Document Reviewed: 10/18/2014  Elsevier Interactive Patient Education  2018 [employer]

## 2017-06-17 NOTE — Progress Notes (Signed)
Pt would like a STD screening.

## 2017-06-19 LAB — CBC
HEMOGLOBIN: 14.3 g/dL (ref 13.0–17.7)
Hematocrit: 42.2 % (ref 37.5–51.0)
MCH: 28.5 pg (ref 26.6–33.0)
MCHC: 33.9 g/dL (ref 31.5–35.7)
MCV: 84 fL (ref 79–97)
PLATELETS: 252 10*3/uL (ref 150–379)
RBC: 5.02 x10E6/uL (ref 4.14–5.80)
RDW: 15 % (ref 12.3–15.4)
WBC: 11.7 10*3/uL — AB (ref 3.4–10.8)

## 2017-06-19 LAB — URINE CYTOLOGY ANCILLARY ONLY
Chlamydia: NEGATIVE
NEISSERIA GONORRHEA: NEGATIVE
TRICH (WINDOWPATH): NEGATIVE

## 2017-06-19 LAB — HSV 1 AND 2 IGM ABS, INDIRECT: HSV 2 IgM: <1:10 {titer}

## 2017-06-19 LAB — HIV ANTIBODY (ROUTINE TESTING W REFLEX): HIV SCREEN 4TH GENERATION: NONREACTIVE

## 2017-06-19 LAB — HSV 1 ANTIBODY, IGG: HSV 1 Glycoprotein G Ab, IgG: <0.91 index (ref 0.00–0.90)

## 2017-06-19 LAB — RPR: RPR Ser Ql: NONREACTIVE

## 2017-06-19 LAB — HSV 2 ANTIBODY, IGG: HSV 2 IGG, TYPE SPEC: 10.7 {index} — AB (ref 0.00–0.90)

## 2017-06-19 LAB — CULTURE, GROUP A STREP: Strep A Culture: POSITIVE — AB

## 2017-06-20 ENCOUNTER — Telehealth: Payer: Self-pay

## 2017-06-20 NOTE — Telephone Encounter (Signed)
Pt was called and a VM was left informing pt to return phone call for lab results.

## 2017-06-28 ENCOUNTER — Telehealth: Payer: Self-pay | Admitting: Family Medicine

## 2017-06-28 NOTE — Telephone Encounter (Signed)
Pt called to request the result for his lab, please follow up

## 2017-07-01 NOTE — Telephone Encounter (Signed)
Pt was called and informed of lab results. 

## 2017-07-02 ENCOUNTER — Other Ambulatory Visit: Payer: Self-pay | Admitting: Pharmacist

## 2017-07-02 MED ORDER — PROAIR HFA 108 (90 BASE) MCG/ACT IN AERS: 2.0000 | INHALATION_SPRAY | Freq: Four times a day (QID) | RESPIRATORY_TRACT | 1 refills | Status: DC | PRN

## 2017-07-05 ENCOUNTER — Ambulatory Visit: Payer: Self-pay | Admitting: Family Medicine

## 2017-07-16 MED FILL — !VENTOLIN HFA INHALER: 108 (90 BAS | 25 days supply | Qty: 18 | Fill #0

## 2017-07-16 MED FILL — AMLODIPINE BESYLATE 10 MG T: 10 | 30 days supply | Qty: 30 | Fill #2

## 2017-07-16 MED FILL — LABETALOL HCL 100 MG TABLET: 100 | 30 days supply | Qty: 180 | Fill #2

## 2017-07-25 ENCOUNTER — Encounter: Payer: Self-pay | Admitting: Family Medicine

## 2017-08-19 ENCOUNTER — Other Ambulatory Visit: Payer: Self-pay | Admitting: Physician Assistant

## 2017-08-26 ENCOUNTER — Emergency Department (HOSPITAL_COMMUNITY)
Admission: EM | Admit: 2017-08-26 | Discharge: 2017-08-26 | Disposition: A | Discharge status: Home / Self Care | Payer: Self-pay | Attending: Emergency Medicine | Admitting: Emergency Medicine

## 2017-08-26 ENCOUNTER — Encounter (HOSPITAL_COMMUNITY): Payer: Self-pay

## 2017-08-26 DIAGNOSIS — F17210 Nicotine dependence, cigarettes, uncomplicated: Secondary | ICD-10-CM | POA: Insufficient documentation

## 2017-08-26 DIAGNOSIS — I129 Hypertensive chronic kidney disease with stage 1 through stage 4 chronic kidney disease, or unspecified chronic kidney disease: Secondary | ICD-10-CM | POA: Insufficient documentation

## 2017-08-26 DIAGNOSIS — Z760 Encounter for issue of repeat prescription: Secondary | ICD-10-CM

## 2017-08-26 DIAGNOSIS — J449 Chronic obstructive pulmonary disease, unspecified: Secondary | ICD-10-CM | POA: Insufficient documentation

## 2017-08-26 DIAGNOSIS — N183 Chronic kidney disease, stage 3 (moderate): Secondary | ICD-10-CM | POA: Insufficient documentation

## 2017-08-26 DIAGNOSIS — Z79899 Other long term (current) drug therapy: Secondary | ICD-10-CM | POA: Insufficient documentation

## 2017-08-26 MED ORDER — LABETALOL HCL 200 MG PO TABS
200.0000 mg | ORAL_TABLET | Freq: Once | ORAL | Status: AC
  Administered 2017-08-26: 200 mg via ORAL
  Filled 2017-08-26: qty 1

## 2017-08-26 MED ORDER — LABETALOL HCL 100 MG PO TABS: 200.0000 mg | ORAL_TABLET | Freq: Two times a day (BID) | ORAL | 0 refills | Status: DC

## 2017-08-26 MED ORDER — AMLODIPINE BESYLATE 10 MG PO TABS: 10.0000 mg | ORAL_TABLET | Freq: Every day | ORAL | 0 refills | Status: DC

## 2017-08-26 NOTE — ED Triage Notes (Signed)
Pt here [facility]. He went to get his RX refilled but they were closed. He takes labetalol and amlodipine. Pt AOX4. Just needs meds refilled. Does endorse slight headache beginnings.

## 2017-08-26 NOTE — Discharge Instructions (Signed)
Please follow-up with your primary care provider for medication refills. Return to the ER for severe headache, vision changes, chest pain, or new or concerning symptoms.

## 2017-08-26 NOTE — ED Provider Notes (Signed)
[facility] EMERGENCY DEPARTMENT Provider Note   CSN: [id number] Arrival date & time: 08/26/17  1739     History   Chief Complaint Chief Complaint  Patient presents with  . Medication Refill    HPI Johnny [name] is a 56 y.o. male with past medical history of CKD, COPD, hep C, hypertension, presenting to the ED for medication refill of his amlodipine and labetalol. Patient states he ran out this morning and has not had his dose today. He states he reported to [facility] to pick up his prescription, however the pharmacy was closed. He states his prescription is available for pickup in the morning, however he is requesting his evening dose and a prescription for the morning dose of his blood pressure medications today in the ED. He states he has a mild headache, however denies vision changes, chest pain, shortness of breath, abdominal pain, or any other complaints today.   The history is provided by the patient.    Past Medical History:  Diagnosis Date  . Chronic kidney disease   . COPD (chronic obstructive pulmonary disease) ([place])   . Hepatitis C   . Hypertension   . OSA (obstructive sleep apnea) 11/03/2015    Patient Active Problem List   Diagnosis Date Noted  . Tobacco abuse 12/07/2016  . Gout 12/07/2016  . COPD (chronic obstructive pulmonary disease) ([place]) 06/07/2016  . CKD (chronic kidney disease) stage 3, GFR 30-59 ml/min (HCC) 06/07/2016  . Essential hypertension 06/07/2016  . Nausea vomiting and diarrhea 06/07/2016  . Acute bronchitis 06/07/2016  . Chronic hepatitis C without hepatic coma ([place]) 03/21/2016  . Excessive sleepiness 02/02/2016  . OSA (obstructive sleep apnea) 11/03/2015  . End stage renal disease ([place]) 04/01/2012    Past Surgical History:  Procedure Laterality Date  . AV FISTULA PLACEMENT  09/12/2009   Left arm AVF       Home Medications    Prior to Admission medications   Medication Sig Start Date End Date  Taking? Authorizing Provider  albuterol (PROVENTIL) (2.5 MG/3ML) 0.083% nebulizer solution Take 3 mLs (2.5 mg total) by nebulization every 6 (six) hours as needed for wheezing or shortness of breath. 12/07/16   Arnoldo Morale, MD  allopurinol (ZYLOPRIM) 100 MG tablet Take 2 tablets (200 mg total) by mouth daily. 12/07/16   Arnoldo Morale, MD  amLODipine (NORVASC) 10 MG tablet TAKE 1 TABLET BY MOUTH DAILY. 08/20/17   Arnoldo Morale, MD  amLODipine (NORVASC) 10 MG tablet Take 1 tablet (10 mg total) by mouth daily. 08/26/17   Russo, [place] N, PA-C  amoxicillin-clavulanate (AUGMENTIN) 875-125 MG tablet Take 1 tablet by mouth 2 (two) times daily. 06/17/17   Arnoldo Morale, MD  atorvastatin (LIPITOR) 20 MG tablet Take 1 tablet (20 mg total) by mouth daily. 12/10/16   Arnoldo Morale, MD  azithromycin (ZITHROMAX) 250 MG tablet 4 tabs PO once Patient not taking: Reported on 06/17/2017 12/07/16   Arnoldo Morale, MD  colchicine 0.6 MG tablet 2 tabs po x 1, then one tab po 1 hour later prn Gout flare 12/07/16   Arnoldo Morale, MD  furosemide (LASIX) 20 MG tablet Take 1 tablet (20 mg total) by mouth 2 (two) times daily. 12/07/16   Arnoldo Morale, MD  labetalol (NORMODYNE) 100 MG tablet TAKE 2 TABLETS BY MOUTH 3 TIMES DAILY. 08/20/17   Arnoldo Morale, MD  labetalol (NORMODYNE) 100 MG tablet Take 2 tablets (200 mg total) by mouth 2 (two) times daily. 08/26/17  Virgina Jock, [place] N, PA-C  mometasone-formoterol (DULERA) 200-5 MCG/ACT AERO Inhale 2 puffs into the lungs 2 (two) times daily. Patient not taking: Reported on 06/17/2017 12/07/16   Arnoldo Morale, MD  predniSONE (DELTASONE) 20 MG tablet Take 1 tablet (20 mg total) by mouth daily with breakfast. 06/17/17   Arnoldo Morale, MD  PROAIR HFA 108 (90 Base) MCG/ACT inhaler Inhale 2 puffs into the lungs every 6 (six) hours as needed for wheezing or shortness of breath. 07/02/17   Arnoldo Morale, MD  traMADol (ULTRAM) 50 MG tablet Take 1 tablet (50 mg total) by mouth every 6 (six) hours as  needed. Patient not taking: Reported on 12/07/2016 04/04/15   Gregor Hams, MD    Family History Family History  Problem Relation Age of Onset  . Hypertension Mother   . Diabetes Mother   . Colon cancer Neg Hx     Social History Social History  Substance Use Topics  . Smoking status: Current Some Day Smoker    Packs/day: 0.10    Types: Cigarettes    Last attempt to quit: 04/02/2011  . Smokeless tobacco: Never Used     Comment: trying to quit  . Alcohol use 0.6 - 1.2 oz/week    1 - 2 Cans of beer per week     Comment: occasional     Allergies   Naproxen   Review of Systems Review of Systems  Eyes: Negative for photophobia and visual disturbance.  Respiratory: Negative for shortness of breath.   Cardiovascular: Negative for chest pain.  Gastrointestinal: Negative for abdominal pain.  Neurological: Positive for headaches.  All other systems reviewed and are negative.    Physical Exam Updated Vital Signs BP (!) 150/92 (BP Location: Right Arm)   Pulse 70   Resp 17   SpO2 96%   Physical Exam  Constitutional: He is oriented to person, place, and time. He appears well-developed and well-nourished. No distress.  HENT:  Head: Normocephalic and atraumatic.  Eyes: Pupils are equal, round, and reactive to light. Conjunctivae and EOM are normal.  Cardiovascular: Normal rate, regular rhythm, normal heart sounds and intact distal pulses.   Pulmonary/Chest: Effort normal and breath sounds normal.  Abdominal: Soft. Bowel sounds are normal.  Neurological: He is alert and oriented to person, place, and time. No cranial nerve deficit.  Psychiatric: He has a normal mood and affect. His behavior is normal.  Nursing note and vitals reviewed.    ED Treatments / Results  Labs (all labs ordered are listed, but only abnormal results are displayed) Labs Reviewed - No data to display  EKG  EKG Interpretation None       Radiology No results found.  Procedures Procedures  (including critical care time)  Medications Ordered in ED Medications  labetalol (NORMODYNE) tablet 200 mg (200 mg Oral Given 08/26/17 2022)     Initial Impression / Assessment and Plan / ED Course  I have reviewed the triage vital signs and the nursing notes.  Pertinent labs & imaging results that were available during my care of the patient were reviewed by me and considered in my medical decision making (see chart for details).     Patient presenting for medication refill of his labetalol and amlodipine. He missed his doses today, and has prescription waiting for pick up tomorrow morning. Patient requesting an evening dose here in ED and prescription for morning dose. Patient complaining of mild headache, however no vision changes or other concerning signs or symptoms. BP 150/92.  Evening dose of labetalol given, and will give morning doses of amlodipine and labetalol prescription. Patient to follow up with his primary care. Return precautions discussed. Patient is safe for discharge at this time.  Discussed results, findings, treatment and follow up. Patient advised of return precautions. Patient verbalized understanding and agreed with plan.  Final Clinical Impressions(s) / ED Diagnoses   Final diagnoses:  Medication refill    New Prescriptions New Prescriptions   AMLODIPINE (NORVASC) 10 MG TABLET    Take 1 tablet (10 mg total) by mouth daily.   LABETALOL (NORMODYNE) 100 MG TABLET    Take 2 tablets (200 mg total) by mouth 2 (two) times daily.     Russo, [place] N, PA-C 08/26/17 2026    Daleen Bo, MD 08/27/17 [phone]

## 2017-08-28 MED FILL — AMLODIPINE BESYLATE 10 MG T: 10 | 30 days supply | Qty: 30 | Fill #0

## 2017-08-28 MED FILL — LABETALOL HCL 100 MG TABLET: 100 | 30 days supply | Qty: 180 | Fill #0

## 2017-08-30 ENCOUNTER — Encounter: Payer: Self-pay | Admitting: Family Medicine

## 2017-08-30 ENCOUNTER — Ambulatory Visit: Payer: Self-pay | Attending: Family Medicine | Admitting: Family Medicine

## 2017-08-30 VITALS — BP 143/89 | HR 77 | Temp 97.8°F | Ht 73.0 in | Wt 204.6 lb

## 2017-08-30 DIAGNOSIS — F17210 Nicotine dependence, cigarettes, uncomplicated: Secondary | ICD-10-CM | POA: Insufficient documentation

## 2017-08-30 DIAGNOSIS — Z79899 Other long term (current) drug therapy: Secondary | ICD-10-CM | POA: Insufficient documentation

## 2017-08-30 DIAGNOSIS — Z833 Family history of diabetes mellitus: Secondary | ICD-10-CM | POA: Insufficient documentation

## 2017-08-30 DIAGNOSIS — L2089 Other atopic dermatitis: Secondary | ICD-10-CM | POA: Insufficient documentation

## 2017-08-30 DIAGNOSIS — Z8249 Family history of ischemic heart disease and other diseases of the circulatory system: Secondary | ICD-10-CM | POA: Insufficient documentation

## 2017-08-30 DIAGNOSIS — Z888 Allergy status to other drugs, medicaments and biological substances status: Secondary | ICD-10-CM | POA: Insufficient documentation

## 2017-08-30 DIAGNOSIS — I120 Hypertensive chronic kidney disease with stage 5 chronic kidney disease or end stage renal disease: Secondary | ICD-10-CM | POA: Insufficient documentation

## 2017-08-30 DIAGNOSIS — B182 Chronic viral hepatitis C: Secondary | ICD-10-CM | POA: Insufficient documentation

## 2017-08-30 DIAGNOSIS — Z7951 Long term (current) use of inhaled steroids: Secondary | ICD-10-CM | POA: Insufficient documentation

## 2017-08-30 DIAGNOSIS — J438 Other emphysema: Secondary | ICD-10-CM | POA: Insufficient documentation

## 2017-08-30 DIAGNOSIS — G4733 Obstructive sleep apnea (adult) (pediatric): Secondary | ICD-10-CM | POA: Insufficient documentation

## 2017-08-30 DIAGNOSIS — I10 Essential (primary) hypertension: Secondary | ICD-10-CM

## 2017-08-30 DIAGNOSIS — N184 Chronic kidney disease, stage 4 (severe): Secondary | ICD-10-CM | POA: Insufficient documentation

## 2017-08-30 DIAGNOSIS — M1A00X0 Idiopathic chronic gout, unspecified site, without tophus (tophi): Secondary | ICD-10-CM | POA: Insufficient documentation

## 2017-08-30 MED ORDER — AMLODIPINE BESYLATE 10 MG PO TABS: 10.0000 mg | ORAL_TABLET | Freq: Every day | ORAL | 5 refills | Status: DC

## 2017-08-30 MED ORDER — ALBUTEROL SULFATE (2.5 MG/3ML) 0.083% IN NEBU: 2.5000 mg | INHALATION_SOLUTION | Freq: Four times a day (QID) | RESPIRATORY_TRACT | 3 refills | Status: DC | PRN

## 2017-08-30 MED ORDER — ATORVASTATIN CALCIUM 20 MG PO TABS: 20.0000 mg | ORAL_TABLET | Freq: Every day | ORAL | 5 refills | Status: DC

## 2017-08-30 MED ORDER — TRIAMCINOLONE ACETONIDE 0.1 % EX CREA: 1.0000 "application " | TOPICAL_CREAM | Freq: Two times a day (BID) | CUTANEOUS | 1 refills | Status: DC

## 2017-08-30 MED ORDER — PROAIR HFA 108 (90 BASE) MCG/ACT IN AERS: 2.0000 | INHALATION_SPRAY | Freq: Four times a day (QID) | RESPIRATORY_TRACT | 1 refills | Status: DC | PRN

## 2017-08-30 MED ORDER — BUPROPION HCL ER (SR) 150 MG PO TB12: 150.0000 mg | ORAL_TABLET | Freq: Two times a day (BID) | ORAL | 2 refills | Status: DC

## 2017-08-30 MED ORDER — ALLOPURINOL 100 MG PO TABS: 200.0000 mg | ORAL_TABLET | Freq: Every day | ORAL | 5 refills | Status: DC

## 2017-08-30 MED ORDER — MOMETASONE FURO-FORMOTEROL FUM 200-5 MCG/ACT IN AERO: 2.0000 | INHALATION_SPRAY | Freq: Two times a day (BID) | RESPIRATORY_TRACT | 5 refills | Status: DC

## 2017-08-30 NOTE — Progress Notes (Signed)
Subjective:  Patient ID: Johnny [name], male    DOB: May 03, 1961  Age: 56 y.o. MRN: [id number]  CC: Rash   HPI Johnny [name] is a 56 y/o [place] American male with medical history of gout, Hepatitis C, COPD, HTN, and CKD stage 4 (last Cr 2.98, GFR 25). He presents in clinic today with c/o rash.  Rash: Patient reports a pruritic rash for 1-2 months. The rash is located in the left antecubital area, wrist, and palm of his hand. He c/o intermittent discomfort/pain on the palm of his hand from fissure. He has used Benadryl po and topical; reports moderate relief from the itching. Denies fevers, chills, localized redness, or joint swelling.   CKD: Patient has a fistula to the LUE. He is not on dialysis. Reports missed nephrology appointment for his 62-monthfollow-up. Requested labs to check his renal function. Denies reduced urinary output, swelling in lower extremities, or fluid retention.   HLD: Patient adherent to medication regimen and heart healthy diet. He is requesting to discontinue his statin medication.   Past Medical History:  Diagnosis Date  . Chronic kidney disease   . COPD [place] disease) [place]   . Hepatitis C   . Hypertension   . OSA (obstructive sleep apnea) 11/03/2015   Past Surgical History:  Procedure Laterality Date  . AV FISTULA PLACEMENT  09/12/2009   Left arm AVF   Patient Active Problem List   Diagnosis Date Noted  . Tobacco abuse 12/07/2016  . Gout 12/07/2016  . COPD (chronic obstructive pulmonary disease) ([place] 06/07/2016  . CKD (chronic kidney disease) stage 4, GFR 15-29 ml/min (HCC) 06/07/2016  . Essential hypertension 06/07/2016  . Nausea vomiting and diarrhea 06/07/2016  . Acute bronchitis 06/07/2016  . Chronic hepatitis C without hepatic coma ([place] 03/21/2016  . Excessive sleepiness 02/02/2016  . OSA (obstructive sleep apnea) 11/03/2015  . End stage renal disease ([place] 04/01/2012   Outpatient Medications Prior to Visit    Medication Sig Dispense Refill  . colchicine 0.6 MG tablet 2 tabs po x 1, then one tab po 1 hour later prn Gout flare 30 tablet 1  . furosemide (LASIX) 20 MG tablet Take 1 tablet (20 mg total) by mouth 2 (two) times daily. 30 tablet 3  . labetalol (NORMODYNE) 100 MG tablet TAKE 2 TABLETS BY MOUTH 3 TIMES DAILY. 180 tablet 0  . albuterol (PROVENTIL) (2.5 MG/3ML) 0.083% nebulizer solution Take 3 mLs (2.5 mg total) by nebulization every 6 (six) hours as needed for wheezing or shortness of breath. 25 mL 3  . allopurinol (ZYLOPRIM) 100 MG tablet Take 2 tablets (200 mg total) by mouth daily. 60 tablet 3  . amLODipine (NORVASC) 10 MG tablet Take 1 tablet (10 mg total) by mouth daily. 1 tablet 0  . atorvastatin (LIPITOR) 20 MG tablet Take 1 tablet (20 mg total) by mouth daily. 30 tablet 3  . labetalol (NORMODYNE) 100 MG tablet Take 2 tablets (200 mg total) by mouth 2 (two) times daily. 2 tablet 0  . mometasone-formoterol (DULERA) 200-5 MCG/ACT AERO Inhale 2 puffs into the lungs 2 (two) times daily. 13 g 5  . PROAIR HFA 108 (90 Base) MCG/ACT inhaler Inhale 2 puffs into the lungs every 6 (six) hours as needed for wheezing or shortness of breath. 1 Inhaler 1  . amLODipine (NORVASC) 10 MG tablet TAKE 1 TABLET BY MOUTH DAILY. (Patient not taking: Reported on 08/30/2017) 30 tablet 0  . amoxicillin-clavulanate (AUGMENTIN) 875-125 MG tablet Take 1  tablet by mouth 2 (two) times daily. (Patient not taking: Reported on 08/30/2017) 20 tablet 0  . azithromycin (ZITHROMAX) 250 MG tablet 4 tabs PO once (Patient not taking: Reported on 06/17/2017) 4 tablet 0  . predniSONE (DELTASONE) 20 MG tablet Take 1 tablet (20 mg total) by mouth daily with breakfast. (Patient not taking: Reported on 08/30/2017) 5 tablet 0  . traMADol (ULTRAM) 50 MG tablet Take 1 tablet (50 mg total) by mouth every 6 (six) hours as needed. (Patient not taking: Reported on 12/07/2016) 15 tablet 0   No facility-administered medications prior to visit.     Allergies  Allergen Reactions  . Naproxen Other (See Comments)    Unknown   Family History  Problem Relation Age of Onset  . Hypertension Mother   . Diabetes Mother   . Colon cancer Neg Hx    Social History   Social History  . Marital status: Divorced    Spouse name: N/A  . Number of children: N/A  . Years of education: 38   Occupational History  . College Student    Social History Main Topics  . Smoking status: Current Some Day Smoker    Packs/day: 0.10    Types: Cigarettes    Last attempt to quit: 04/02/2011  . Smokeless tobacco: Never Used     Comment: trying to quit  . Alcohol use 0.6 - 1.2 oz/week    1 - 2 Cans of beer per week     Comment: occasional  . Drug use: No  . Sexual activity: Not on file   Other Topics Concern  . Not on file   Social History Narrative   Lives at home alone.   Right-handed.    Occasional caffeine use.   ROS See HPI Review of Systems  Constitutional: Negative for activity change, appetite change, chills, fatigue and fever.  Respiratory: Negative for cough, chest tightness and shortness of breath.   Cardiovascular: Negative for chest pain, palpitations and leg swelling.  Genitourinary: Negative for decreased urine volume and dysuria.  Musculoskeletal: Negative for arthralgias, joint swelling and myalgias.  Skin: Positive for rash. Negative for color change and wound (resolved fissure on L hand).  Neurological: Negative for weakness, light-headedness and headaches.  Psychiatric/Behavioral: Negative for confusion, decreased concentration and sleep disturbance.  All other systems reviewed and are negative.   Objective:  BP (!) 143/89   Pulse 77   Temp 97.8 F (36.6 C) (Oral)   Ht 6' 1"  (1.854 m)   Wt 204 lb 9.6 oz (92.8 kg)   SpO2 100%   BMI 26.99 kg/m   BP/Weight 08/30/2017 08/26/2017 5/46/5035  Systolic BP 465 681 275  Diastolic BP 89 170 70  Wt. (Lbs) 204.6 - 192.4  BMI 26.99 - 25.38   The 10-year ASCVD risk  score Mikey Bussing DC Jr., et al., 2013) is: 18.1%   Values used to calculate the score:     Age: 7 years     Sex: Male     Is Non-Hispanic African American: Yes     Diabetic: No     Tobacco smoker: Yes     Systolic Blood Pressure: 017 mmHg     Is BP treated: Yes     HDL Cholesterol: 88 mg/dL     Total Cholesterol: 200 mg/dL  Physical Exam  Constitutional: He is oriented to person, place, and time. He appears well-developed and well-nourished.  HENT:  Head: Normocephalic and atraumatic.  Neck: Normal range of motion. Neck supple.  Cardiovascular: Normal rate, regular rhythm and normal heart sounds.   Pulmonary/Chest: Effort normal and breath sounds normal.  Musculoskeletal: Normal range of motion. He exhibits no edema or tenderness.  Neurological: He is alert and oriented to person, place, and time.  Skin: Skin is warm, dry and intact. Rash noted. Rash is not maculopapular, not pustular and not vesicular. No erythema.  Psychiatric: He has a normal mood and affect. His behavior is normal. Judgment and thought content normal.  Nursing note and vitals reviewed.  Assessment & Plan:   1. Other emphysema ([place]), controlled - Will continue regimen. Patient requested refill on medications. - mometasone-formoterol (DULERA) 200-5 MCG/ACT AERO; Inhale 2 puffs into the lungs 2 (two) times daily.  Dispense: 13 g; Refill: 5 - albuterol (PROVENTIL) (2.5 MG/3ML) 0.083% nebulizer solution; Take 3 mLs (2.5 mg total) by nebulization every 6 (six) hours as needed for wheezing or shortness of breath.  Dispense: 25 mL; Refill: 3  2. Idiopathic chronic gout without tophus, unspecified site, controlled - Denies acute events. Will continue regimen. Patient request refill on medication.  - allopurinol (ZYLOPRIM) 100 MG tablet; Take 2 tablets (200 mg total) by mouth daily.  Dispense: 60 tablet; Refill: 5  3. CKD (chronic kidney disease) stage 4, GFR 15-29 ml/min (HCC), uncontrolled  - Patient unable to see  Nephrology for follow-up. Ordered labs to check renal function.  - CMP14+EGFR  4. Other atopic dermatitis, acute  - Acute rash on the LUE. Ordered topical steroid. - Provided education on management of dry skin with mild soaps, luke warm showers, and apply good moisturizers, such as Cetaphil or Aquaphor.  - Triamcinolone cream Kenalog) 0.1%  5. Essential hypertension, uncontrolled  - In clinic BP 143/84. Patient recently seen in ER for medication refill on Amlodipine and Labetaolol. He is currently asymptomatic. - Will continue with medication regimen.   6. Smoking Cessation - Provided smoking cessation education. Informed that his ASCVD risk was increased due to his smoking.  - Patient is ready to quit smoking. Started on Wellbutrin to assist.  - Wellbutrin SR 150 mg BID  Meds ordered this encounter  Medications  . amLODipine (NORVASC) 10 MG tablet    Sig: Take 1 tablet (10 mg total) by mouth daily.    Dispense:  30 tablet    Refill:  5  . mometasone-formoterol (DULERA) 200-5 MCG/ACT AERO    Sig: Inhale 2 puffs into the lungs 2 (two) times daily.    Dispense:  13 g    Refill:  5  . PROAIR HFA 108 (90 Base) MCG/ACT inhaler    Sig: Inhale 2 puffs into the lungs every 6 (six) hours as needed for wheezing or shortness of breath.    Dispense:  1 Inhaler    Refill:  1  . albuterol (PROVENTIL) (2.5 MG/3ML) 0.083% nebulizer solution    Sig: Take 3 mLs (2.5 mg total) by nebulization every 6 (six) hours as needed for wheezing or shortness of breath.    Dispense:  25 mL    Refill:  3  . atorvastatin (LIPITOR) 20 MG tablet    Sig: Take 1 tablet (20 mg total) by mouth daily.    Dispense:  30 tablet    Refill:  5  . allopurinol (ZYLOPRIM) 100 MG tablet    Sig: Take 2 tablets (200 mg total) by mouth daily.    Dispense:  60 tablet    Refill:  5  . triamcinolone cream (KENALOG) 0.1 %    Sig: Apply  1 application topically 2 (two) times daily.    Dispense:  45 g    Refill:  1     Follow-up: Return in about 3 months (around 11/30/2017) for Follow-up of emphysema and hypertension.   Archie Endo Watson

## 2017-08-30 NOTE — Patient Instructions (Signed)

## 2017-08-31 LAB — CMP14+EGFR
ALK PHOS: 68 IU/L (ref 39–117)
ALT: 36 IU/L (ref 0–44)
AST: 39 IU/L (ref 0–40)
Albumin/Globulin Ratio: 1.2 (ref 1.2–2.2)
Albumin: 3.9 g/dL (ref 3.5–5.5)
BILIRUBIN TOTAL: 0.5 mg/dL (ref 0.0–1.2)
BUN / CREAT RATIO: 13 (ref 9–20)
BUN: 39 mg/dL — ABNORMAL HIGH (ref 6–24)
CHLORIDE: 100 mmol/L (ref 96–106)
CO2: 26 mmol/L (ref 20–29)
Calcium: 9.6 mg/dL (ref 8.7–10.2)
Creatinine, Ser: 3.04 mg/dL — ABNORMAL HIGH (ref 0.76–1.27)
GFR calc Af Amer: 26 mL/min/{1.73_m2} — ABNORMAL LOW (ref >59)
GFR calc non Af Amer: 22 mL/min/{1.73_m2} — ABNORMAL LOW (ref >59)
GLUCOSE: 90 mg/dL (ref 65–99)
Globulin, Total: 3.3 g/dL (ref 1.5–4.5)
POTASSIUM: 4.6 mmol/L (ref 3.5–5.2)
SODIUM: 140 mmol/L (ref 134–144)
Total Protein: 7.2 g/dL (ref 6.0–8.5)

## 2017-09-04 ENCOUNTER — Telehealth: Payer: Self-pay

## 2017-09-04 NOTE — Telephone Encounter (Signed)
Pt was called and VM was full.    If pt returns phone call please inform pt that kidney function slightly worse compared to 8 months ago. He should reschedule his f/u appt with the kidney specialist if he has not already done so.

## 2017-09-23 ENCOUNTER — Other Ambulatory Visit: Payer: Self-pay | Admitting: Family Medicine

## 2017-10-01 MED FILL — !PROAIR HFA 90 MCG INHALER: 108 (90 BAS | 25 days supply | Qty: 1 | Fill #0

## 2017-10-01 MED FILL — ?TRIAMCINOLONE 0.1% CRM: 0.1 | 30 days supply | Qty: 45 | Fill #0

## 2017-10-01 MED FILL — BUPROPION SR 150 MG TABLET: 150 | 30 days supply | Qty: 60 | Fill #0

## 2017-10-01 MED FILL — LABETALOL HCL 100 MG TABLET: 100 | 30 days supply | Qty: 180 | Fill #0

## 2017-10-01 MED FILL — ALBUTEROL 0.083% INHAL SOLN: (2.5 MG/3ML | 7 days supply | Qty: 90 | Fill #0

## 2017-10-01 MED FILL — ?ATORVASTATIN 20 MG TABLET: 20 | 30 days supply | Qty: 30 | Fill #0

## 2017-10-02 ENCOUNTER — Other Ambulatory Visit: Payer: Self-pay

## 2017-10-02 ENCOUNTER — Ambulatory Visit: Payer: Self-pay | Attending: Family Medicine | Admitting: Physician Assistant

## 2017-10-02 VITALS — BP 136/87 | HR 68 | Temp 98.2°F | Resp 16 | Ht 73.0 in | Wt 202.8 lb

## 2017-10-02 DIAGNOSIS — I10 Essential (primary) hypertension: Secondary | ICD-10-CM | POA: Insufficient documentation

## 2017-10-02 DIAGNOSIS — Z7951 Long term (current) use of inhaled steroids: Secondary | ICD-10-CM | POA: Insufficient documentation

## 2017-10-02 DIAGNOSIS — Z79899 Other long term (current) drug therapy: Secondary | ICD-10-CM | POA: Insufficient documentation

## 2017-10-02 DIAGNOSIS — I129 Hypertensive chronic kidney disease with stage 1 through stage 4 chronic kidney disease, or unspecified chronic kidney disease: Secondary | ICD-10-CM | POA: Insufficient documentation

## 2017-10-02 DIAGNOSIS — M25562 Pain in left knee: Secondary | ICD-10-CM | POA: Insufficient documentation

## 2017-10-02 DIAGNOSIS — M1A00X0 Idiopathic chronic gout, unspecified site, without tophus (tophi): Secondary | ICD-10-CM | POA: Insufficient documentation

## 2017-10-02 DIAGNOSIS — J449 Chronic obstructive pulmonary disease, unspecified: Secondary | ICD-10-CM | POA: Insufficient documentation

## 2017-10-02 DIAGNOSIS — G4733 Obstructive sleep apnea (adult) (pediatric): Secondary | ICD-10-CM | POA: Insufficient documentation

## 2017-10-02 DIAGNOSIS — Z886 Allergy status to analgesic agent status: Secondary | ICD-10-CM | POA: Insufficient documentation

## 2017-10-02 DIAGNOSIS — N189 Chronic kidney disease, unspecified: Secondary | ICD-10-CM | POA: Insufficient documentation

## 2017-10-02 DIAGNOSIS — Z8619 Personal history of other infectious and parasitic diseases: Secondary | ICD-10-CM | POA: Insufficient documentation

## 2017-10-02 MED ORDER — TRAMADOL HCL 50 MG PO TABS
50.0000 mg | ORAL_TABLET | Freq: Three times a day (TID) | ORAL | 0 refills | Status: DC | PRN
Start: 2017-10-02 — End: 2018-02-12

## 2017-10-02 MED ORDER — ALLOPURINOL 100 MG PO TABS: 200.0000 mg | ORAL_TABLET | Freq: Every day | ORAL | 5 refills | Status: DC

## 2017-10-02 MED ORDER — COLCHICINE 0.6 MG PO TABS: ORAL_TABLET | ORAL | 1 refills | Status: DC

## 2017-10-02 MED FILL — ?ALLOPURINOL 100MG TABLET: 100 | 30 days supply | Qty: 60 | Fill #0

## 2017-10-02 MED FILL — COLCHICINE 0.6 MG TABS: 0.6 | 5 days supply | Qty: 15 | Fill #0

## 2017-10-02 NOTE — Progress Notes (Signed)
Patient ID: Johnny [name], male   DOB: 04-13-1961, 56 y.o.   MRN: [id number]   Johnny [name], is a 56 y.o. male  ATF:[id number]  YHC:[id number]  DOB - 02-Sep-1961  Subjective:  Chief Complaint and HPI: Johnny [name] is a 56 y.o. male here today for L knee pain.  He has been out of allopuriol for a few weeks.  He started having L knee pain about 2 weeks. No f/c.  He has a h/o gout in ankles and feet but hasn't had flare in his knee.  NKI to L knee.  He also needs a note for school for a couple of days over the last couple of months for gout flares.  He will provide [place] with the dates.    ROS:   Constitutional:  No f/c, No night sweats, No unexplained weight loss. EENT:  No vision changes, No blurry vision, No hearing changes. No mouth, throat, or ear problems.  Respiratory: No cough, No SOB Cardiac: No CP, no palpitations GI:  No abd pain, No N/V/D. GU: No Urinary s/sx Musculoskeletal: +L knee pain Neuro: No headache, no dizziness, no motor weakness.  Skin: No rash Endocrine:  No polydipsia. No polyuria.  Psych: Denies SI/HI  No problems updated.  ALLERGIES: Allergies  Allergen Reactions  . Naproxen Other (See Comments)    Unknown    PAST MEDICAL HISTORY: Past Medical History:  Diagnosis Date  . Chronic kidney disease   . COPD (chronic obstructive pulmonary disease) ([place])   . Hepatitis C   . Hypertension   . OSA (obstructive sleep apnea) 11/03/2015    MEDICATIONS AT HOME: Prior to Admission medications   Medication Sig Start Date End Date Taking? Authorizing Provider  albuterol (PROVENTIL) (2.5 MG/3ML) 0.083% nebulizer solution Take 3 mLs (2.5 mg total) by nebulization every 6 (six) hours as needed for wheezing or shortness of breath. 08/30/17  Yes Arnoldo Morale, MD  allopurinol (ZYLOPRIM) 100 MG tablet Take 2 tablets (200 mg total) by mouth daily. 10/02/17  Yes Freeman Caldron M, PA-C  amLODipine (NORVASC) 10 MG tablet Take 1 tablet (10 mg total) by mouth daily. 08/30/17  Yes  Arnoldo Morale, MD  atorvastatin (LIPITOR) 20 MG tablet Take 1 tablet (20 mg total) by mouth daily. 08/30/17  Yes Arnoldo Morale, MD  buPROPion (WELLBUTRIN SR) 150 MG 12 hr tablet Take 1 tablet (150 mg total) by mouth 2 (two) times daily. 08/30/17  Yes Arnoldo Morale, MD  colchicine 0.6 MG tablet 2 tabs po x 1, then one tab po 1 hour later prn Gout flare 10/02/17  Yes McClung, Angela M, PA-C  furosemide (LASIX) 20 MG tablet Take 1 tablet (20 mg total) by mouth 2 (two) times daily. 12/07/16  Yes Arnoldo Morale, MD  labetalol (NORMODYNE) 100 MG tablet TAKE 2 TABLETS BY MOUTH 3 TIMES DAILY. 09/23/17  Yes Amao, Charlane Ferretti, MD  mometasone-formoterol (DULERA) 200-5 MCG/ACT AERO Inhale 2 puffs into the lungs 2 (two) times daily. 08/30/17  Yes Arnoldo Morale, MD  PROAIR HFA 108 (90 Base) MCG/ACT inhaler Inhale 2 puffs into the lungs every 6 (six) hours as needed for wheezing or shortness of breath. 08/30/17  Yes Arnoldo Morale, MD  triamcinolone cream (KENALOG) 0.1 % Apply 1 application topically 2 (two) times daily. 08/30/17  Yes Arnoldo Morale, MD  traMADol (ULTRAM) 50 MG tablet Take 1 tablet (50 mg total) by mouth every 8 (eight) hours as needed. 10/02/17   Argentina Donovan, PA-C     Objective:  EXAM:  Vitals:   10/02/17 1406  BP: 136/87  Pulse: 68  Resp: 16  Temp: 98.2 F (36.8 C)  TempSrc: Oral  SpO2: 99%  Weight: 202 lb 12.8 oz (92 kg)  Height: 6\' 1"  (1.854 m)    General appearance : A&OX3. NAD. Non-toxic-appearing HEENT: Atraumatic and Normocephalic.  PERRLA. EOM intact.   Neck: supple, no JVD. No cervical lymphadenopathy. No thyromegaly Chest/Lungs:  Breathing-non-labored, Good air entry bilaterally, breath sounds normal without rales, rhonchi, or wheezing  CVS: S1 S2 regular, no murmurs, gallops, rubs  Extremities: Bilateral Lower Ext shows no edema, both legs are warm to touch with = pulse throughout.  L knee is warm and swollen without erythema.  No ballotment.  ROM is limited.  Ligaments  are stable Neurology:  CN II-XII grossly intact, Non focal.   Psych:  TP linear. J/I WNL. Normal speech. Appropriate eye contact and affect.  Skin:  No Rash  Data Review Lab Results  Component Value Date   HGBA1C  02/21/2009    5.3 (NOTE) The ADA recommends the following therapeutic goal for glycemic control related to Hgb A1c measurement: Goal of therapy: <6.5 Hgb A1c  Reference: American Diabetes Association: Clinical Practice Recommendations 2010, Diabetes Care, 2010, 33: (Suppl  1).     Assessment & Plan   1. Idiopathic chronic gout without tophus, unspecified site- acute L knee pain-presumed gout flare bc recently ran out of allopurinol.   - colchicine 0.6 MG tablet; 2 tabs po x 1, then one tab po 1 hour later prn Gout flare  Dispense: 30 tablet; Refill: 1  Resume after current gout flare- allopurinol (ZYLOPRIM) 100 MG tablet; Take 2 tablets (200 mg total) by mouth daily.  Dispense: 60 tablet; Refill: 5  - traMADol (ULTRAM) 50 MG tablet; Take 1 tablet (50 mg total) by mouth every 8 (eight) hours as needed.  Dispense: 30 tablet; Refill: 0(if needed)  2. Essential hypertension At goal.  Continue current regimen.  Patient have been counseled extensively about nutrition and exercise  Return in about 2 months (around 12/02/2017) for Dr Jarold Song; htn, gout.  The patient was given clear instructions to go to ER or return to medical center if symptoms don't improve, worsen or new problems develop. The patient verbalized understanding. The patient was told to call to get lab results if they haven't heard anything in the next week.     Freeman Caldron, PA-C [facility] [place], [place]   10/02/2017, 2:28 PM

## 2017-10-02 NOTE — Progress Notes (Signed)
102

## 2017-10-03 MED FILL — AMLODIPINE BESYLATE 10 MG T: 10 | 30 days supply | Qty: 30 | Fill #0

## 2017-11-12 MED FILL — LABETALOL HCL 100 MG TABLET: 100 | 30 days supply | Qty: 180 | Fill #1

## 2017-11-12 MED FILL — AMLODIPINE BESYLATE 10 MG T: 10 | 30 days supply | Qty: 30 | Fill #1

## 2017-12-03 ENCOUNTER — Ambulatory Visit: Payer: Self-pay | Admitting: Family Medicine

## 2017-12-10 MED FILL — AMLODIPINE BESYLATE 10 MG T: 10 | 30 days supply | Qty: 30 | Fill #2

## 2017-12-18 MED FILL — LABETALOL HCL 100 MG TABLET: 100 | 30 days supply | Qty: 180 | Fill #2

## 2017-12-18 MED FILL — !COLCRYS 0.6 MG TABLET: 0.6 MG | 10 days supply | Qty: 30 | Fill #1

## 2017-12-23 ENCOUNTER — Telehealth: Payer: Self-pay | Admitting: Family Medicine

## 2017-12-23 NOTE — Telephone Encounter (Signed)
Pt need to check lab please follow up and letter need for school

## 2017-12-23 NOTE — Telephone Encounter (Signed)
Patient was called and there is no voice mail to leave a message. Patient does not have any lab work since 10/18.

## 2018-01-03 ENCOUNTER — Ambulatory Visit: Payer: Self-pay | Admitting: Family Medicine

## 2018-01-09 ENCOUNTER — Other Ambulatory Visit: Payer: Self-pay | Admitting: Family Medicine

## 2018-01-09 MED FILL — ALLOPURINOL 100 MG TABLET: 100 | 30 days supply | Qty: 60 | Fill #1

## 2018-01-09 MED FILL — AMLODIPINE BESYLATE 10 MG T: 10 | 30 days supply | Qty: 30 | Fill #3

## 2018-01-09 MED FILL — !PROAIR HFA 90 MCG INHALER: 108 (90 BAS | 25 days supply | Qty: 1 | Fill #1

## 2018-01-10 ENCOUNTER — Ambulatory Visit: Payer: Self-pay | Admitting: Family Medicine

## 2018-01-21 ENCOUNTER — Other Ambulatory Visit: Payer: Self-pay | Admitting: Family Medicine

## 2018-01-22 MED FILL — LABETALOL HCL 100 MG TABLET: 100 | 30 days supply | Qty: 180 | Fill #0

## 2018-02-12 ENCOUNTER — Encounter: Payer: Self-pay | Admitting: Family Medicine

## 2018-02-12 ENCOUNTER — Ambulatory Visit: Payer: Self-pay | Attending: Family Medicine | Admitting: Family Medicine

## 2018-02-12 ENCOUNTER — Ambulatory Visit: Payer: Self-pay | Attending: Family Medicine | Admitting: Licensed Clinical Social Worker

## 2018-02-12 VITALS — BP 129/74 | HR 74 | Temp 97.3°F | Ht 73.0 in | Wt 199.0 lb

## 2018-02-12 DIAGNOSIS — J438 Other emphysema: Secondary | ICD-10-CM | POA: Insufficient documentation

## 2018-02-12 DIAGNOSIS — F419 Anxiety disorder, unspecified: Principal | ICD-10-CM

## 2018-02-12 DIAGNOSIS — M1A00X0 Idiopathic chronic gout, unspecified site, without tophus (tophi): Secondary | ICD-10-CM | POA: Insufficient documentation

## 2018-02-12 DIAGNOSIS — F329 Major depressive disorder, single episode, unspecified: Secondary | ICD-10-CM | POA: Insufficient documentation

## 2018-02-12 DIAGNOSIS — F32A Depression, unspecified: Secondary | ICD-10-CM

## 2018-02-12 DIAGNOSIS — I129 Hypertensive chronic kidney disease with stage 1 through stage 4 chronic kidney disease, or unspecified chronic kidney disease: Secondary | ICD-10-CM | POA: Insufficient documentation

## 2018-02-12 DIAGNOSIS — Z79899 Other long term (current) drug therapy: Secondary | ICD-10-CM | POA: Insufficient documentation

## 2018-02-12 DIAGNOSIS — B1920 Unspecified viral hepatitis C without hepatic coma: Secondary | ICD-10-CM | POA: Insufficient documentation

## 2018-02-12 DIAGNOSIS — I10 Essential (primary) hypertension: Secondary | ICD-10-CM

## 2018-02-12 DIAGNOSIS — G4733 Obstructive sleep apnea (adult) (pediatric): Secondary | ICD-10-CM | POA: Insufficient documentation

## 2018-02-12 DIAGNOSIS — Z113 Encounter for screening for infections with a predominantly sexual mode of transmission: Secondary | ICD-10-CM | POA: Insufficient documentation

## 2018-02-12 DIAGNOSIS — Z886 Allergy status to analgesic agent status: Secondary | ICD-10-CM | POA: Insufficient documentation

## 2018-02-12 DIAGNOSIS — Z720 Tobacco use: Secondary | ICD-10-CM | POA: Insufficient documentation

## 2018-02-12 DIAGNOSIS — N184 Chronic kidney disease, stage 4 (severe): Secondary | ICD-10-CM | POA: Insufficient documentation

## 2018-02-12 MED ORDER — LABETALOL HCL 200 MG PO TABS: 200.0000 mg | ORAL_TABLET | Freq: Three times a day (TID) | ORAL | 3 refills | Status: DC

## 2018-02-12 MED ORDER — ALLOPURINOL 100 MG PO TABS: 200.0000 mg | ORAL_TABLET | Freq: Every day | ORAL | 5 refills | Status: DC

## 2018-02-12 MED ORDER — FUROSEMIDE 20 MG PO TABS: 20.0000 mg | ORAL_TABLET | Freq: Two times a day (BID) | ORAL | 3 refills | Status: DC

## 2018-02-12 MED ORDER — ALBUTEROL SULFATE HFA 108 (90 BASE) MCG/ACT IN AERS: 2.0000 | INHALATION_SPRAY | Freq: Four times a day (QID) | RESPIRATORY_TRACT | 3 refills | Status: DC | PRN

## 2018-02-12 MED ORDER — ATORVASTATIN CALCIUM 20 MG PO TABS: 20.0000 mg | ORAL_TABLET | Freq: Every day | ORAL | 5 refills | Status: DC

## 2018-02-12 MED ORDER — AMLODIPINE BESYLATE 10 MG PO TABS: 10.0000 mg | ORAL_TABLET | Freq: Every day | ORAL | 5 refills | Status: DC

## 2018-02-12 MED ORDER — NICOTINE 7 MG/24HR TD PT24: 7.0000 mg | MEDICATED_PATCH | Freq: Every day | TRANSDERMAL | 1 refills | Status: DC

## 2018-02-12 MED ORDER — FLUOXETINE HCL 20 MG PO TABS: 20.0000 mg | ORAL_TABLET | Freq: Every day | ORAL | 3 refills | Status: DC

## 2018-02-12 MED ORDER — COLCHICINE 0.6 MG PO TABS: ORAL_TABLET | ORAL | 3 refills | Status: DC

## 2018-02-12 MED ORDER — MOMETASONE FURO-FORMOTEROL FUM 200-5 MCG/ACT IN AERO: 2.0000 | INHALATION_SPRAY | Freq: Two times a day (BID) | RESPIRATORY_TRACT | 5 refills | Status: DC

## 2018-02-12 MED FILL — AMLODIPINE BESYLATE 10 MG T: 10 | 30 days supply | Qty: 30 | Fill #0

## 2018-02-12 NOTE — Progress Notes (Signed)
Subjective:  Patient ID: Johnny [name], male    DOB: 31-Mar-1961  Age: 57 y.o. MRN: [id number]  CC: Hypertension   HPI Johnny [name] is a 57 year old male with a history of COPD, gout, stage IV chronic kidney disease, hypertension, sleep apnea presents today for a follow-up visit. He denies COPD exacerbation and has had no recent gout flares. Compliant with his antihypertensive and denies adverse effects. With respect to his CKD he has not followed up with nephrology lately.  He has trouble concentrating which she has noticed more at school and mostly when he is working and is wondering if he has ADHD.  He further describes this as having dropping grades at school and having a hard time focusing only when he tries to do some work.  He does not sleep properly, has daytime somnolence and has a diagnosis of sleep apnea from sleep study of 11/2015 but he was unable to obtain his CPAP machine. On further questioning he endorses a history of anxiety, being on edge and is sometimes without motivation or has a lack of interest in doing things; denies suicidal ideation or intents. He is requesting an STD screen.  Past Medical History:  Diagnosis Date  . Chronic kidney disease   . COPD (chronic obstructive pulmonary disease) ([place])   . Hepatitis C   . Hypertension   . OSA (obstructive sleep apnea) 11/03/2015    Past Surgical History:  Procedure Laterality Date  . AV FISTULA PLACEMENT  09/12/2009   Left arm AVF    Allergies  Allergen Reactions  . Naproxen Other (See Comments)    Unknown    Outpatient Medications Prior to Visit  Medication Sig Dispense Refill  . albuterol (PROVENTIL) (2.5 MG/3ML) 0.083% nebulizer solution Take 3 mLs (2.5 mg total) by nebulization every 6 (six) hours as needed for wheezing or shortness of breath. 90 mL 3  . triamcinolone cream (KENALOG) 0.1 % Apply 1 application topically 2 (two) times daily. 45 g 1  . allopurinol (ZYLOPRIM) 100 MG tablet Take 2 tablets  (200 mg total) by mouth daily. 60 tablet 5  . amLODipine (NORVASC) 10 MG tablet Take 1 tablet (10 mg total) by mouth daily. 30 tablet 5  . atorvastatin (LIPITOR) 20 MG tablet Take 1 tablet (20 mg total) by mouth daily. 30 tablet 5  . colchicine 0.6 MG tablet 2 tabs po x 1, then one tab po 1 hour later prn Gout flare 30 tablet 1  . furosemide (LASIX) 20 MG tablet Take 1 tablet (20 mg total) by mouth 2 (two) times daily. 30 tablet 3  . labetalol (NORMODYNE) 100 MG tablet TAKE 2 TABLETS BY MOUTH 3 TIMES DAILY. 180 tablet 0  . mometasone-formoterol (DULERA) 200-5 MCG/ACT AERO Inhale 2 puffs into the lungs 2 (two) times daily. 13 g 5  . PROAIR HFA 108 (90 Base) MCG/ACT inhaler Inhale 2 puffs into the lungs every 6 (six) hours as needed for wheezing or shortness of breath. 1 Inhaler 1  . traMADol (ULTRAM) 50 MG tablet Take 1 tablet (50 mg total) by mouth every 8 (eight) hours as needed. 30 tablet 0  . buPROPion (WELLBUTRIN SR) 150 MG 12 hr tablet Take 1 tablet (150 mg total) by mouth 2 (two) times daily. (Patient not taking: Reported on 02/12/2018) 60 tablet 2   No facility-administered medications prior to visit.     ROS Review of Systems  Constitutional: Negative for activity change and appetite change.  HENT: Negative for sinus pressure  and sore throat.   Eyes: Negative for visual disturbance.  Respiratory: Negative for cough, chest tightness and shortness of breath.   Cardiovascular: Negative for chest pain and leg swelling.  Gastrointestinal: Negative for abdominal distention, abdominal pain, constipation and diarrhea.  Endocrine: Negative.   Genitourinary: Negative for dysuria.  Musculoskeletal: Negative for joint swelling and myalgias.  Skin: Negative for rash.  Allergic/Immunologic: Negative.   Neurological: Negative for weakness, light-headedness and numbness.  Psychiatric/Behavioral: Positive for sleep disturbance. Negative for dysphoric mood and suicidal ideas.    Objective:  BP  129/74   Pulse 74   Temp (!) 97.3 F (36.3 C) (Oral)   Ht 6' 1"  (1.854 m)   Wt 199 lb (90.3 kg)   SpO2 97%   BMI 26.25 kg/m   BP/Weight 02/12/2018 10/02/2017 49/82/6415  Systolic BP 830 940 768  Diastolic BP 74 87 89  Wt. (Lbs) 199 202.8 204.6  BMI 26.25 26.76 26.99      Physical Exam  Constitutional: He is oriented to person, place, and time. He appears well-developed and well-nourished.  Cardiovascular: Normal rate, normal heart sounds and intact distal pulses.  No murmur heard. Pulmonary/Chest: Effort normal and breath sounds normal. He has no wheezes. He has no rales. He exhibits no tenderness.  Abdominal: Soft. Bowel sounds are normal. He exhibits no distension and no mass. There is no tenderness.  Musculoskeletal: Normal range of motion.  L arm AV fistula with palpable thrill  Neurological: He is alert and oriented to person, place, and time.  Skin: Skin is warm and dry.  Psychiatric: He has a normal mood and affect.     Assessment & Plan:   1. Other emphysema ([place]) Stable - mometasone-formoterol (DULERA) 200-5 MCG/ACT AERO; Inhale 2 puffs into the lungs 2 (two) times daily.  Dispense: 13 g; Refill: 5 - albuterol (PROAIR HFA) 108 (90 Base) MCG/ACT inhaler; Inhale 2 puffs into the lungs every 6 (six) hours as needed for wheezing or shortness of breath.  Dispense: 1 Inhaler; Refill: 3  2. Idiopathic chronic gout without tophus, unspecified site No recent flare - colchicine 0.6 MG tablet; 2 tabs po x 1, then one tab po 1 hour later prn Gout flare  Dispense: 30 tablet; Refill: 3 - allopurinol (ZYLOPRIM) 100 MG tablet; Take 2 tablets (200 mg total) by mouth daily.  Dispense: 60 tablet; Refill: 5  3. Essential hypertension Controlled - labetalol (NORMODYNE) 200 MG tablet; Take 1 tablet (200 mg total) by mouth 3 (three) times daily.  Dispense: 90 tablet; Refill: 3 - CMP14+EGFR - Lipid panel - amLODipine (NORVASC) 10 MG tablet; Take 1 tablet (10 mg total) by mouth  daily.  Dispense: 30 tablet; Refill: 5  4. CKD (chronic kidney disease) stage 4, GFR 15-29 ml/min (HCC) Not on hemodialysis Not compliant with nephrology follow-up and has been advised to follow-up - furosemide (LASIX) 20 MG tablet; Take 1 tablet (20 mg total) by mouth 2 (two) times daily.  Dispense: 30 tablet; Refill: 3  5. Anxiety and depression Commenced on Prozac for anxiety LCSW called in for counseling Advised if he thinks he has ADHD he will need to be evaluated by psychiatry at [facility] for this. - FLUoxetine (PROZAC) 20 MG tablet; Take 1 tablet (20 mg total) by mouth daily.  Dispense: 30 tablet; Refill: 3  6. Tobacco abuse - nicotine (NICODERM CQ) 7 mg/24hr patch; Place 1 patch (7 mg total) onto the skin daily.  Dispense: 28 patch; Refill: 1  7. Obstructive sleep apnea Prescription  for CPAP machine written we will try to obtain this through the sleep apnea foundation. Lack of concentration may be due to obstructive sleep apnea including daytime somnolence which affect his work with resulting dropping of his grades.  Meds ordered this encounter  Medications  . nicotine (NICODERM CQ) 7 mg/24hr patch    Sig: Place 1 patch (7 mg total) onto the skin daily.    Dispense:  28 patch    Refill:  1  . mometasone-formoterol (DULERA) 200-5 MCG/ACT AERO    Sig: Inhale 2 puffs into the lungs 2 (two) times daily.    Dispense:  13 g    Refill:  5  . albuterol (PROAIR HFA) 108 (90 Base) MCG/ACT inhaler    Sig: Inhale 2 puffs into the lungs every 6 (six) hours as needed for wheezing or shortness of breath.    Dispense:  1 Inhaler    Refill:  3  . labetalol (NORMODYNE) 200 MG tablet    Sig: Take 1 tablet (200 mg total) by mouth 3 (three) times daily.    Dispense:  90 tablet    Refill:  3  . furosemide (LASIX) 20 MG tablet    Sig: Take 1 tablet (20 mg total) by mouth 2 (two) times daily.    Dispense:  30 tablet    Refill:  3  . colchicine 0.6 MG tablet    Sig: 2 tabs po x 1, then one  tab po 1 hour later prn Gout flare    Dispense:  30 tablet    Refill:  3  . FLUoxetine (PROZAC) 20 MG tablet    Sig: Take 1 tablet (20 mg total) by mouth daily.    Dispense:  30 tablet    Refill:  3  . atorvastatin (LIPITOR) 20 MG tablet    Sig: Take 1 tablet (20 mg total) by mouth daily.    Dispense:  30 tablet    Refill:  5  . amLODipine (NORVASC) 10 MG tablet    Sig: Take 1 tablet (10 mg total) by mouth daily.    Dispense:  30 tablet    Refill:  5  . allopurinol (ZYLOPRIM) 100 MG tablet    Sig: Take 2 tablets (200 mg total) by mouth daily.    Dispense:  60 tablet    Refill:  5    Follow-up: Return in about 3 months (around 05/14/2018) for follow up of chronic medical conditions.   Charlott Rakes MD

## 2018-02-12 NOTE — BH Specialist Note (Signed)
Integrated Behavioral Health Initial Visit  MRN: [id number] Name: Johnny [name]  Number of [facility] Clinician visits:: 1/6 Session Start time: 10:05 AM  Session End time: 10:45 AM Total time: 40 minutes  Type of Service: [place] Interpretor:No. Interpretor Name and Language: N/A   Warm Hand Off Completed.       SUBJECTIVE: Johnny [name] is a 57 y.o. male accompanied by self Patient was referred by Dr. Margarita Rana for depression. Patient reports the following symptoms/concerns: difficulty sleeping, decreased concentration, decreased appetite, feelings of worry, and lack of motivation Duration of problem: "years ago"; Severity of problem: moderate  OBJECTIVE: Mood: Pleasant and Affect: Appropriate Risk of harm to self or others: No plan to harm self or others  LIFE CONTEXT: Family and Social: Pt has stable housing. He reports no family due to both parents being deceased.  School/Work: Pt is a Ship broker at [employer]. 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   GOALS ADDRESSED: Patient will: 1. Reduce symptoms of: anxiety and depression 2. Increase knowledge and/or ability of: coping skills and stress reduction  3. Demonstrate ability to: Increase adequate support systems for patient/family  INTERVENTIONS: Interventions utilized: Solution-Focused Strategies, Supportive Counseling, Psychoeducation and/or Health Education and Link to [employer]  Standardized Assessments completed: GAD-7 and PHQ 2&9  ASSESSMENT: Patient currently experiencing depression and anxiety triggered by ongoing medical conditions and stress related to  school. He reports difficulty sleeping, decreased concentration, decreased appetite, feelings of worry, and lack of motivation. Pt has no family and receives limited support in the community.   Patient may benefit from psychoeducation, psychotherapy, and medication management. LCSWA educated pt on the correlation between one's physical and mental health, in addition, to how stress can negatively impact health. Therapeutic interventions were discussed to decrease symptoms and manage stress. Pt agreed to participate in medication management and was provided information on [employer], [place], and psychotherapy. Pt was strongly encouraged to schedule appointment with financial counseling.   PLAN: 1. Follow up with behavioral health clinician on : Pt was encouraged to contact [place] if symptoms worsen or fail to improve to schedule behavioral appointments at [facility]. 2. Behavioral recommendations: LCSWA recommends that pt apply healthy coping skills discussed, comply with medication management, schedule appointment with financial counseling, and utilize provided resources. Pt is encouraged to schedule follow up appointment with LCSWA 3. Referral(s): [place] (In Clinic) and [employer] Resources:  Financial trader 4. "From scale of 1-10, how likely are you to follow plan?":   [name], LCSW 02/14/18 9:53 AM

## 2018-02-12 NOTE — Progress Notes (Signed)
Patient would like to try another medication to help him stop smoking.

## 2018-02-13 LAB — HSV TYPE I/II IGG, IGMW/ REFLEX
HSV 1 IgM: <1:10 {titer}
HSV 2 IGG, TYPE SPEC: 9.08 {index} — AB (ref 0.00–0.90)

## 2018-02-13 LAB — CMP14+EGFR
A/G RATIO: 1.1 — AB (ref 1.2–2.2)
ALK PHOS: 76 IU/L (ref 39–117)
ALT: 43 IU/L (ref 0–44)
AST: 60 IU/L — ABNORMAL HIGH (ref 0–40)
Albumin: 4 g/dL (ref 3.5–5.5)
BILIRUBIN TOTAL: 0.4 mg/dL (ref 0.0–1.2)
BUN / CREAT RATIO: 9 (ref 9–20)
BUN: 27 mg/dL — AB (ref 6–24)
CHLORIDE: 102 mmol/L (ref 96–106)
CO2: 22 mmol/L (ref 20–29)
Calcium: 9.4 mg/dL (ref 8.7–10.2)
Creatinine, Ser: 2.91 mg/dL — ABNORMAL HIGH (ref 0.76–1.27)
GFR calc Af Amer: 27 mL/min/{1.73_m2} — ABNORMAL LOW (ref >59)
GFR calc non Af Amer: 23 mL/min/{1.73_m2} — ABNORMAL LOW (ref >59)
GLOBULIN, TOTAL: 3.7 g/dL (ref 1.5–4.5)
GLUCOSE: 90 mg/dL (ref 65–99)
POTASSIUM: 4.3 mmol/L (ref 3.5–5.2)
SODIUM: 141 mmol/L (ref 134–144)
Total Protein: 7.7 g/dL (ref 6.0–8.5)

## 2018-02-13 LAB — RPR: RPR: NONREACTIVE

## 2018-02-13 LAB — LIPID PANEL
Chol/HDL Ratio: 2.5 ratio (ref 0.0–5.0)
Cholesterol, Total: 228 mg/dL — ABNORMAL HIGH (ref 100–199)
HDL: 93 mg/dL
LDL Calculated: 113 mg/dL — ABNORMAL HIGH (ref 0–99)
Triglycerides: 109 mg/dL (ref 0–149)
VLDL Cholesterol Cal: 22 mg/dL (ref 5–40)

## 2018-02-13 LAB — HIV ANTIBODY (ROUTINE TESTING W REFLEX): HIV Screen 4th Generation wRfx: NONREACTIVE

## 2018-02-13 LAB — URINE CYTOLOGY ANCILLARY ONLY
Chlamydia: NEGATIVE
Neisseria Gonorrhea: NEGATIVE
Trichomonas: POSITIVE — AB

## 2018-02-14 MED ORDER — METRONIDAZOLE 500 MG PO TABS: 2000.0000 mg | ORAL_TABLET | Freq: Once | ORAL | 0 refills | Status: AC

## 2018-02-14 MED ORDER — ATORVASTATIN CALCIUM 40 MG PO TABS: 40.0000 mg | ORAL_TABLET | Freq: Every day | ORAL | 3 refills | Status: DC

## 2018-02-17 ENCOUNTER — Telehealth: Payer: Self-pay

## 2018-02-17 NOTE — Telephone Encounter (Signed)
Call placed to the patient at the request of Dr Margarita Rana to discuss need for CPAP machine. This CM explained to the patient that the [facility] is able to obtain CPAP machines for patients who are unable to afford machines. The clinic can order a machine for the patient through the American Sleep [employer]nea Association - CPAP assistance program. The program asks for $100 donation and the [facility] will pay that donation. The machines are refurbished and the clinic will only pay for 1 machine for the patient. It was also explained that the machine may take a couple of weeks to a month to receive. It will be delivered to the [facility] and then a respiratory therapist will meet with the patient to instruct regarding the use/ care of the machine.  The patient will then be given the machine.It was also explained that there is no guarantee that comes with the machine.  At this time, the patient needs to sign a waiver before the request is submitted to the [place]. He stated that he understood all instructions and will be at the clinic on 02/20/18 to sign the waiver when he comes to pick up his medications at the pharmacy.

## 2018-02-24 NOTE — Progress Notes (Signed)
Patient returned phone call and was informed of lab results. 

## 2018-02-25 ENCOUNTER — Telehealth: Payer: Self-pay

## 2018-02-25 NOTE — Telephone Encounter (Signed)
Attempted to contact the patient to remind him of the need to sign the waiver for the auto pap.  Call placed to # [phone] (M) and a HIPAA compliant message was left requesting a call back to # [phone].

## 2018-03-06 ENCOUNTER — Telehealth: Payer: Self-pay | Admitting: Family Medicine

## 2018-03-06 MED FILL — LABETALOL HCL 200 MG TABLET: 200 | 30 days supply | Qty: 90 | Fill #0

## 2018-03-06 MED FILL — metroNIDAZOLE 500 MG TABS: 500 | 1 days supply | Qty: 4 | Fill #0

## 2018-03-06 MED FILL — FLUoxetine HCL 20 MG CAPS: 20 | 30 days supply | Qty: 30 | Fill #0

## 2018-03-06 MED FILL — !PROAIR HFA 90 MCG INHALER: 108 (90 BAS | 25 days supply | Qty: 1 | Fill #0

## 2018-03-06 NOTE — Telephone Encounter (Signed)
Call placed to patient [phone], to remind him of the CPAP waiver that he needs to sign. No answer. Voice mailbox full. Unable to leave patient a message.

## 2018-03-10 MED FILL — AMLODIPINE BESYLATE 10 MG T: 10 | 30 days supply | Qty: 30 | Fill #1

## 2018-03-13 ENCOUNTER — Telehealth: Payer: Self-pay | Admitting: Family Medicine

## 2018-03-13 ENCOUNTER — Telehealth: Payer: Self-pay

## 2018-03-13 NOTE — Telephone Encounter (Signed)
Call placed to patient [phone] regarding the Cpap waiver. No answer. Patient's voice mailbox is full and therefore I'm unable to leave patient a message to return my call at [phone].

## 2018-03-13 NOTE — Telephone Encounter (Signed)
Letter sent to patient requesting he contact this office to discuss the status of the CPAP order as we have not been able to reach him by phone

## 2018-03-24 MED FILL — !COLCRYS 0.6 MG TABLET: 0.6 MG | 30 days supply | Qty: 30 | Fill #0

## 2018-04-07 ENCOUNTER — Telehealth: Payer: Self-pay

## 2018-04-07 NOTE — Telephone Encounter (Signed)
Noted  

## 2018-04-07 NOTE — Telephone Encounter (Signed)
Attempted to contact the patient to remind him of the need to sign the waiver for a CPAP machine that will be ordered through the American Sleep [employer]nea Association. Call placed to patient [phone], no answer. Patient's voice mailbox is full and therefore unable to leave patient a message to return call at [phone].   Multiple phone calls have been made in attempt to reach the patient and a letter was sent to his home, no response.

## 2018-04-11 MED FILL — AMLODIPINE BESYLATE 10 MG T: 10 | 30 days supply | Qty: 30 | Fill #4

## 2018-04-11 MED FILL — LABETALOL HCL 200 MG TABLET: 200 | 30 days supply | Qty: 90 | Fill #1

## 2018-05-30 MED FILL — LABETALOL HCL 200 MG TABLET: 200 | 30 days supply | Qty: 90 | Fill #2

## 2018-05-30 MED FILL — ?TRIDERM 0.1% CREAM: 0.1 | 30 days supply | Qty: 1 | Fill #0

## 2018-06-02 ENCOUNTER — Encounter (HOSPITAL_COMMUNITY): Payer: Self-pay | Admitting: Emergency Medicine

## 2018-06-02 ENCOUNTER — Ambulatory Visit (HOSPITAL_COMMUNITY)
Admission: EM | Admit: 2018-06-02 | Discharge: 2018-06-02 | Disposition: A | Discharge status: Home / Self Care | Payer: Self-pay | Attending: Family Medicine | Admitting: Family Medicine

## 2018-06-02 DIAGNOSIS — Z79899 Other long term (current) drug therapy: Secondary | ICD-10-CM | POA: Insufficient documentation

## 2018-06-02 DIAGNOSIS — J029 Acute pharyngitis, unspecified: Secondary | ICD-10-CM | POA: Insufficient documentation

## 2018-06-02 DIAGNOSIS — R509 Fever, unspecified: Secondary | ICD-10-CM

## 2018-06-02 DIAGNOSIS — F17210 Nicotine dependence, cigarettes, uncomplicated: Secondary | ICD-10-CM | POA: Insufficient documentation

## 2018-06-02 DIAGNOSIS — Z113 Encounter for screening for infections with a predominantly sexual mode of transmission: Secondary | ICD-10-CM | POA: Insufficient documentation

## 2018-06-02 DIAGNOSIS — Z202 Contact with and (suspected) exposure to infections with a predominantly sexual mode of transmission: Secondary | ICD-10-CM

## 2018-06-02 DIAGNOSIS — R05 Cough: Secondary | ICD-10-CM

## 2018-06-02 DIAGNOSIS — M791 Myalgia, unspecified site: Secondary | ICD-10-CM

## 2018-06-02 LAB — POCT URINALYSIS DIP (DEVICE)
Glucose, UA: NEGATIVE mg/dL
Hgb urine dipstick: NEGATIVE
Ketones, ur: NEGATIVE mg/dL
Leukocytes, UA: NEGATIVE
Nitrite: NEGATIVE
Specific Gravity, Urine: >=1.03 (ref 1.005–1.030)
Urobilinogen, UA: 4 mg/dL — ABNORMAL HIGH (ref 0.0–1.0)
pH: 6 (ref 5.0–8.0)

## 2018-06-02 LAB — POCT RAPID STREP A: STREPTOCOCCUS, GROUP A SCREEN (DIRECT): NEGATIVE

## 2018-06-02 MED ORDER — AZITHROMYCIN 250 MG PO TABS
1000.0000 mg | ORAL_TABLET | Freq: Once | ORAL | Status: AC
  Administered 2018-06-02: 1000 mg via ORAL

## 2018-06-02 MED ORDER — CEFTRIAXONE SODIUM 250 MG IJ SOLR
250.0000 mg | Freq: Once | INTRAMUSCULAR | Status: AC
  Administered 2018-06-02: 250 mg via INTRAMUSCULAR

## 2018-06-02 MED ORDER — AZITHROMYCIN 250 MG PO TABS
ORAL_TABLET | ORAL | Status: AC
  Filled 2018-06-02: qty 4

## 2018-06-02 MED ORDER — ACETAMINOPHEN 500 MG PO TABS: 500.0000 mg | ORAL_TABLET | Freq: Four times a day (QID) | ORAL | 0 refills | Status: DC | PRN

## 2018-06-02 MED ORDER — LIDOCAINE HCL (PF) 1 % IJ SOLN
INTRAMUSCULAR | Status: AC
  Filled 2018-06-02: qty 2

## 2018-06-02 MED ORDER — CEFTRIAXONE SODIUM 250 MG IJ SOLR
INTRAMUSCULAR | Status: AC
  Filled 2018-06-02: qty 250

## 2018-06-02 NOTE — ED Triage Notes (Signed)
PT has sore throat, low grade temp for 1 week.  Wants STD check, symptoms earlier this week

## 2018-06-02 NOTE — ED Provider Notes (Signed)
[place]    CSN: [id number] Arrival date & time: 06/02/18  1918     History   Chief Complaint Chief Complaint  Patient presents with  . Sore Throat    HPI Johnny [name] is a 57 y.o. male.   [name] presents with complaints of sore throat, low grade fever, body aches, chills, slight cough, which started 4 days ago. Decreased appetite due to pain. Mild abdominal discomfort. Drinking fluids. Has not taken any medications for symptoms. States he had a friend who also had a sore throat. Also requests STD screen as had unprotected sex with male partner. States he did engage in oral intercourse. States he has had an unusual sensation with urination, as well as frequency with urination. No penile discharge. No blood in urine. No sores or lesions. States has had STD in the past. Hx of ckd, copd, hep c, htn, osa.     ROS per HPI.      Past Medical History:  Diagnosis Date  . Chronic kidney disease   . COPD (chronic obstructive pulmonary disease) ([place])   . Hepatitis C   . Hypertension   . OSA (obstructive sleep apnea) 11/03/2015    Patient Active Problem List   Diagnosis Date Noted  . Anxiety and depression 02/12/2018  . Tobacco abuse 12/07/2016  . Gout 12/07/2016  . COPD (chronic obstructive pulmonary disease) ([place]) 06/07/2016  . CKD (chronic kidney disease) stage 4, GFR 15-29 ml/min ([facility]) 06/07/2016  . Essential hypertension 06/07/2016  . Nausea vomiting and diarrhea 06/07/2016  . Acute bronchitis 06/07/2016  . Chronic hepatitis C without hepatic coma ([place]) 03/21/2016  . Excessive sleepiness 02/02/2016  . OSA (obstructive sleep apnea) 11/03/2015  . End stage renal disease ([place]) 04/01/2012    Past Surgical History:  Procedure Laterality Date  . AV FISTULA PLACEMENT  09/12/2009   Left arm AVF       Home Medications    Prior to Admission medications   Medication Sig Start Date End Date Taking? Authorizing Provider  albuterol (PROAIR HFA) 108 (90  Base) MCG/ACT inhaler Inhale 2 puffs into the lungs every 6 (six) hours as needed for wheezing or shortness of breath. 02/12/18  Yes Charlott Rakes, MD  albuterol (PROVENTIL) (2.5 MG/3ML) 0.083% nebulizer solution Take 3 mLs (2.5 mg total) by nebulization every 6 (six) hours as needed for wheezing or shortness of breath. 08/30/17  Yes Charlott Rakes, MD  allopurinol (ZYLOPRIM) 100 MG tablet Take 2 tablets (200 mg total) by mouth daily. 02/12/18  Yes Charlott Rakes, MD  amLODipine (NORVASC) 10 MG tablet Take 1 tablet (10 mg total) by mouth daily. 02/12/18  Yes Charlott Rakes, MD  atorvastatin (LIPITOR) 40 MG tablet Take 1 tablet (40 mg total) by mouth daily. 02/14/18  Yes Charlott Rakes, MD  colchicine 0.6 MG tablet 2 tabs po x 1, then one tab po 1 hour later prn Gout flare 02/12/18  Yes Newlin, Enobong, MD  labetalol (NORMODYNE) 200 MG tablet Take 1 tablet (200 mg total) by mouth 3 (three) times daily. 02/12/18  Yes Newlin, Charlane Ferretti, MD  mometasone-formoterol (DULERA) 200-5 MCG/ACT AERO Inhale 2 puffs into the lungs 2 (two) times daily. 02/12/18  Yes Charlott Rakes, MD  triamcinolone cream (KENALOG) 0.1 % Apply 1 application topically 2 (two) times daily. 08/30/17  Yes Charlott Rakes, MD  acetaminophen (TYLENOL) 500 MG tablet Take 1 tablet (500 mg total) by mouth every 6 (six) hours as needed. 06/02/18   Zigmund Gottron, NP  FLUoxetine (  PROZAC) 20 MG tablet Take 1 tablet (20 mg total) by mouth daily. 02/12/18   Charlott Rakes, MD  furosemide (LASIX) 20 MG tablet Take 1 tablet (20 mg total) by mouth 2 (two) times daily. 02/12/18   Charlott Rakes, MD  nicotine (NICODERM CQ) 7 mg/24hr patch Place 1 patch (7 mg total) onto the skin daily. 02/12/18   Charlott Rakes, MD    Family History Family History  Problem Relation Age of Onset  . Hypertension Mother   . Diabetes Mother   . Colon cancer Neg Hx     Social History Social History   Tobacco Use  . Smoking status: Current Some Day Smoker     Packs/day: 0.10    Types: Cigarettes    Last attempt to quit: 04/02/2011    Years since quitting: 7.1  . Smokeless tobacco: Never Used  . Tobacco comment: trying to quit  Substance Use Topics  . Alcohol use: Yes    Alcohol/week: 0.6 - 1.2 oz    Types: 1 - 2 Cans of beer per week    Comment: occasional  . Drug use: No     Allergies   Naproxen   Review of Systems Review of Systems   Physical Exam Triage Vital Signs ED Triage Vitals [06/02/18 1950]  Enc Vitals Group     BP 137/84     Pulse Rate 77     Resp 16     Temp 99.3 F (37.4 C)     Temp Source Oral     SpO2 100 %     Weight      Height      Head Circumference      Peak Flow      Pain Score 9     Pain Loc      Pain Edu?      Excl. in [place]?    No data found.  Updated Vital Signs BP 137/84   Pulse 77   Temp 99.3 F (37.4 C) (Oral)   Resp 16   SpO2 100%   Physical Exam  Constitutional: He is oriented to person, place, and time. He appears well-developed and well-nourished.  HENT:  Head: Normocephalic and atraumatic.  Right Ear: Tympanic membrane, external ear and ear canal normal.  Left Ear: Tympanic membrane, external ear and ear canal normal.  Nose: Nose normal. Right sinus exhibits no maxillary sinus tenderness and no frontal sinus tenderness. Left sinus exhibits no maxillary sinus tenderness and no frontal sinus tenderness.  Mouth/Throat: Uvula is midline, oropharynx is clear and moist and mucous membranes are normal. Tonsils are 1+ on the right. Tonsils are 1+ on the left. No tonsillar exudate.  Eyes: Pupils are equal, round, and reactive to light. Conjunctivae are normal.  Neck: Normal range of motion.  Cardiovascular: Normal rate and regular rhythm.  Pulmonary/Chest: Effort normal and breath sounds normal.  Lymphadenopathy:    He has no cervical adenopathy.  Neurological: He is alert and oriented to person, place, and time.  Skin: Skin is warm and dry.  Vitals reviewed.    UC Treatments /  Results  Labs (all labs ordered are listed, but only abnormal results are displayed) Labs Reviewed  POCT URINALYSIS DIP (DEVICE) - Abnormal; Notable for the following components:      Result Value   Bilirubin Urine SMALL (*)    Protein, ur >=300 (*)    Urobilinogen, UA 4.0 (*)    All other components within normal limits  CULTURE, GROUP A  STREP [facility])  POCT RAPID STREP A  URINE CYTOLOGY ANCILLARY ONLY  CYTOLOGY, (ORAL, ANAL, URETHRAL) ANCILLARY ONLY    EKG None  Radiology No results found.  Procedures Procedures (including critical care time)  Medications Ordered in UC Medications  azithromycin (ZITHROMAX) tablet 1,000 mg (1,000 mg Oral Given 06/02/18 2104)  cefTRIAXone (ROCEPHIN) injection 250 mg (250 mg Intramuscular Given 06/02/18 2104)    Initial Impression / Assessment and Plan / UC Course  I have reviewed the triage vital signs and the nursing notes.  Pertinent labs & imaging results that were available during my care of the patient were reviewed by me and considered in my medical decision making (see chart for details).     Urine cytology and throat swab collected for std screen. Negative rapid strep. ua without indications of UTI.  Likely viral pharyngitis. Tylenol for pain control. Push fluids to ensure adequate hydration and keep secretions thin. Empiric std treatment provided. Encouraged safe sex practices. Return precautions provided. If symptoms worsen or do not improve in the next week to return to be seen or to follow up with PCP.  Patient verbalized understanding and agreeable to plan.   Final Clinical Impressions(s) / UC Diagnoses   Final diagnoses:  Pharyngitis, unspecified etiology  Screen for STD (sexually transmitted disease)     Discharge Instructions     Push fluids to ensure adequate hydration and keep secretions thin.  Tylenol  as needed for pain or fevers.   Throat lozenges, gargles, chloraseptic spray, warm teas, popsicles etc to help with  throat pain.  Please withhold from intercourse for the next week. Please use condoms to prevent STD's.   Will notify you of any positive findings from your screening and if any changes to treatment are needed.   If symptoms worsen or do not improve in the next week to return to be seen or to follow up with your PCP.      ED Prescriptions    Medication Sig Dispense Auth. Provider   acetaminophen (TYLENOL) 500 MG tablet Take 1 tablet (500 mg total) by mouth every 6 (six) hours as needed. 30 tablet Zigmund Gottron, NP     Controlled Substance Prescriptions [place] Controlled Substance Registry consulted? Not Applicable   Zigmund Gottron, NP 06/02/18 2107

## 2018-06-02 NOTE — Discharge Instructions (Signed)
Push fluids to ensure adequate hydration and keep secretions thin.  Tylenol  as needed for pain or fevers.   Throat lozenges, gargles, chloraseptic spray, warm teas, popsicles etc to help with throat pain.  Please withhold from intercourse for the next week. Please use condoms to prevent STD's.   Will notify you of any positive findings from your screening and if any changes to treatment are needed.   If symptoms worsen or do not improve in the next week to return to be seen or to follow up with your PCP.

## 2018-06-04 LAB — URINE CYTOLOGY ANCILLARY ONLY
CHLAMYDIA, DNA PROBE: NEGATIVE
Neisseria Gonorrhea: NEGATIVE
TRICH (WINDOWPATH): POSITIVE — AB

## 2018-06-04 LAB — CYTOLOGY, (ORAL, ANAL, URETHRAL) ANCILLARY ONLY
CHLAMYDIA, DNA PROBE: NEGATIVE
NEISSERIA GONORRHEA: NEGATIVE

## 2018-06-05 ENCOUNTER — Telehealth (HOSPITAL_COMMUNITY): Payer: Self-pay

## 2018-06-05 LAB — CULTURE, GROUP A STREP (THRC)

## 2018-06-05 MED ORDER — METRONIDAZOLE 500 MG PO TABS: 2000.0000 mg | ORAL_TABLET | Freq: Once | ORAL | 0 refills | Status: AC

## 2018-06-05 MED FILL — metroNIDAZOLE 500 MG TABS: 500 | 1 days supply | Qty: 4 | Fill #0

## 2018-06-05 NOTE — Telephone Encounter (Signed)
Negative strep.  Trichomonas is positive. Rx metronidazole 2 grams PO once no refills was sent to the pharmacy of record. Need to educate patient to refrain from sexual intercourse for 7 days to give the medicine time to work. Sexual partners need to be notified and tested/treated. Condoms may reduce risk of reinfection.   Attempted to reach patient. No answer and no voicemail available.

## 2018-06-10 ENCOUNTER — Telehealth (HOSPITAL_COMMUNITY): Payer: Self-pay

## 2018-06-10 MED FILL — AMLODIPINE BESYLATE 10 MG T: 10 | 30 days supply | Qty: 30 | Fill #5

## 2018-06-10 NOTE — Telephone Encounter (Signed)
Attempted to reach patient x 3. No answer. Letter sent.

## 2018-07-10 MED FILL — LABETALOL HCL 200 MG TABLET: 200 | 30 days supply | Qty: 90 | Fill #3

## 2018-07-10 MED FILL — AMLODIPINE BESYLATE 10 MG T: 10 | 30 days supply | Qty: 30 | Fill #2

## 2018-07-14 ENCOUNTER — Ambulatory Visit: Payer: Self-pay

## 2018-08-15 MED FILL — AMLODIPINE BESYLATE 10 MG T: 10 | 30 days supply | Qty: 30 | Fill #3

## 2018-08-19 ENCOUNTER — Other Ambulatory Visit: Payer: Self-pay | Admitting: Family Medicine

## 2018-08-19 DIAGNOSIS — I10 Essential (primary) hypertension: Secondary | ICD-10-CM

## 2018-08-20 MED FILL — LABETALOL HCL 200 MG TABLET: 200 | 30 days supply | Qty: 90 | Fill #0

## 2018-09-22 ENCOUNTER — Other Ambulatory Visit: Payer: Self-pay | Admitting: Family Medicine

## 2018-09-22 DIAGNOSIS — L2089 Other atopic dermatitis: Secondary | ICD-10-CM

## 2018-09-22 DIAGNOSIS — I10 Essential (primary) hypertension: Secondary | ICD-10-CM

## 2018-09-23 ENCOUNTER — Other Ambulatory Visit: Payer: Self-pay | Admitting: Family Medicine

## 2018-09-23 DIAGNOSIS — I10 Essential (primary) hypertension: Secondary | ICD-10-CM

## 2018-10-06 ENCOUNTER — Telehealth: Payer: Self-pay | Admitting: Family Medicine

## 2018-10-06 DIAGNOSIS — I10 Essential (primary) hypertension: Secondary | ICD-10-CM

## 2018-10-06 MED ORDER — LABETALOL HCL 200 MG PO TABS: 200.0000 mg | ORAL_TABLET | Freq: Three times a day (TID) | ORAL | 0 refills | Status: DC

## 2018-10-06 NOTE — Telephone Encounter (Signed)
1) Medication(s) Requested (by name): -labetalol (NORMODYNE) 200 MG tablet   2) Pharmacy of Choice: -[facility], [place] 3) Special Requests:   Approved medications will be sent to the pharmacy, we will reach out if there is an issue.  Requests made after 3pm may not be addressed until the following business day!  If a patient is unsure of the name of the medication(s) please note and ask patient to call back when they are able to provide all info, do not send to responsible party until all information is available!

## 2018-10-06 NOTE — Telephone Encounter (Signed)
Pt not seen > 6 months. Will given enough supply to last until being seen later this month.

## 2018-10-07 MED FILL — LABETALOL HCL 200 MG TABLET: 200 | 16 days supply | Qty: 48 | Fill #0

## 2018-10-22 ENCOUNTER — Ambulatory Visit: Payer: Self-pay | Admitting: Family Medicine

## 2018-10-27 ENCOUNTER — Other Ambulatory Visit: Payer: Self-pay | Admitting: Family Medicine

## 2018-10-27 ENCOUNTER — Telehealth: Payer: Self-pay | Admitting: Family Medicine

## 2018-10-27 DIAGNOSIS — I10 Essential (primary) hypertension: Secondary | ICD-10-CM

## 2018-10-27 MED ORDER — LABETALOL HCL 200 MG PO TABS: 200.0000 mg | ORAL_TABLET | Freq: Three times a day (TID) | ORAL | 0 refills | Status: DC

## 2018-10-27 NOTE — Telephone Encounter (Signed)
1) Medication(s) Requested (by name): labetalol 2) Pharmacy of Choice:  [facility]

## 2018-10-27 NOTE — Telephone Encounter (Signed)
Refilled

## 2018-10-30 MED FILL — LABETALOL HCL 200 MG TABLET: 200 | 16 days supply | Qty: 48 | Fill #0

## 2018-11-10 ENCOUNTER — Ambulatory Visit: Payer: Self-pay | Attending: Critical Care Medicine | Admitting: Critical Care Medicine

## 2018-11-10 ENCOUNTER — Encounter: Payer: Self-pay | Admitting: Critical Care Medicine

## 2018-11-10 VITALS — BP 133/79 | HR 81 | Temp 98.2°F | Resp 18 | Ht 73.0 in | Wt 195.0 lb

## 2018-11-10 DIAGNOSIS — Z8249 Family history of ischemic heart disease and other diseases of the circulatory system: Secondary | ICD-10-CM | POA: Insufficient documentation

## 2018-11-10 DIAGNOSIS — L309 Dermatitis, unspecified: Secondary | ICD-10-CM

## 2018-11-10 DIAGNOSIS — Z886 Allergy status to analgesic agent status: Secondary | ICD-10-CM | POA: Insufficient documentation

## 2018-11-10 DIAGNOSIS — J432 Centrilobular emphysema: Secondary | ICD-10-CM

## 2018-11-10 DIAGNOSIS — B182 Chronic viral hepatitis C: Secondary | ICD-10-CM

## 2018-11-10 DIAGNOSIS — Z87891 Personal history of nicotine dependence: Secondary | ICD-10-CM | POA: Insufficient documentation

## 2018-11-10 DIAGNOSIS — E7800 Pure hypercholesterolemia, unspecified: Secondary | ICD-10-CM

## 2018-11-10 DIAGNOSIS — H6091 Unspecified otitis externa, right ear: Secondary | ICD-10-CM

## 2018-11-10 DIAGNOSIS — L2089 Other atopic dermatitis: Secondary | ICD-10-CM

## 2018-11-10 DIAGNOSIS — H60501 Unspecified acute noninfective otitis externa, right ear: Secondary | ICD-10-CM | POA: Insufficient documentation

## 2018-11-10 DIAGNOSIS — I770 Arteriovenous fistula, acquired: Secondary | ICD-10-CM

## 2018-11-10 DIAGNOSIS — Z992 Dependence on renal dialysis: Secondary | ICD-10-CM | POA: Insufficient documentation

## 2018-11-10 DIAGNOSIS — Z79899 Other long term (current) drug therapy: Secondary | ICD-10-CM | POA: Insufficient documentation

## 2018-11-10 DIAGNOSIS — I130 Hypertensive heart and chronic kidney disease with heart failure and stage 1 through stage 4 chronic kidney disease, or unspecified chronic kidney disease: Secondary | ICD-10-CM | POA: Insufficient documentation

## 2018-11-10 DIAGNOSIS — M1A00X0 Idiopathic chronic gout, unspecified site, without tophus (tophi): Secondary | ICD-10-CM

## 2018-11-10 DIAGNOSIS — I5032 Chronic diastolic (congestive) heart failure: Secondary | ICD-10-CM

## 2018-11-10 DIAGNOSIS — Z720 Tobacco use: Secondary | ICD-10-CM

## 2018-11-10 DIAGNOSIS — F32A Depression, unspecified: Secondary | ICD-10-CM

## 2018-11-10 DIAGNOSIS — I10 Essential (primary) hypertension: Secondary | ICD-10-CM

## 2018-11-10 DIAGNOSIS — J438 Other emphysema: Secondary | ICD-10-CM

## 2018-11-10 DIAGNOSIS — H60391 Other infective otitis externa, right ear: Secondary | ICD-10-CM

## 2018-11-10 DIAGNOSIS — F329 Major depressive disorder, single episode, unspecified: Secondary | ICD-10-CM | POA: Insufficient documentation

## 2018-11-10 DIAGNOSIS — E785 Hyperlipidemia, unspecified: Secondary | ICD-10-CM | POA: Insufficient documentation

## 2018-11-10 DIAGNOSIS — Z833 Family history of diabetes mellitus: Secondary | ICD-10-CM | POA: Insufficient documentation

## 2018-11-10 DIAGNOSIS — Z760 Encounter for issue of repeat prescription: Secondary | ICD-10-CM | POA: Insufficient documentation

## 2018-11-10 DIAGNOSIS — F419 Anxiety disorder, unspecified: Secondary | ICD-10-CM

## 2018-11-10 DIAGNOSIS — J439 Emphysema, unspecified: Secondary | ICD-10-CM | POA: Insufficient documentation

## 2018-11-10 DIAGNOSIS — Z7951 Long term (current) use of inhaled steroids: Secondary | ICD-10-CM | POA: Insufficient documentation

## 2018-11-10 DIAGNOSIS — N184 Chronic kidney disease, stage 4 (severe): Secondary | ICD-10-CM

## 2018-11-10 DIAGNOSIS — G4733 Obstructive sleep apnea (adult) (pediatric): Secondary | ICD-10-CM

## 2018-11-10 DIAGNOSIS — I5033 Acute on chronic diastolic (congestive) heart failure: Secondary | ICD-10-CM | POA: Insufficient documentation

## 2018-11-10 HISTORY — DX: Unspecified otitis externa, right ear: H60.91

## 2018-11-10 MED ORDER — FUROSEMIDE 20 MG PO TABS: 20.0000 mg | ORAL_TABLET | Freq: Every day | ORAL | 3 refills | Status: DC | PRN

## 2018-11-10 MED ORDER — AMLODIPINE BESYLATE 10 MG PO TABS: 10.0000 mg | ORAL_TABLET | Freq: Every day | ORAL | 5 refills | Status: DC

## 2018-11-10 MED ORDER — ATORVASTATIN CALCIUM 40 MG PO TABS: 40.0000 mg | ORAL_TABLET | Freq: Every day | ORAL | 3 refills | Status: DC

## 2018-11-10 MED ORDER — CIPROFLOXACIN-HYDROCORTISONE 0.2-1 % OT SUSP: 3.0000 [drp] | Freq: Two times a day (BID) | OTIC | 0 refills | Status: DC

## 2018-11-10 MED ORDER — TRIAMCINOLONE ACETONIDE 0.1 % EX CREA: 1.0000 "application " | TOPICAL_CREAM | Freq: Two times a day (BID) | CUTANEOUS | 1 refills | Status: DC

## 2018-11-10 MED ORDER — ALBUTEROL SULFATE HFA 108 (90 BASE) MCG/ACT IN AERS: 2.0000 | INHALATION_SPRAY | Freq: Four times a day (QID) | RESPIRATORY_TRACT | 3 refills | Status: DC | PRN

## 2018-11-10 MED ORDER — COLCHICINE 0.6 MG PO TABS: ORAL_TABLET | ORAL | 3 refills | Status: DC

## 2018-11-10 MED ORDER — ALLOPURINOL 100 MG PO TABS: 200.0000 mg | ORAL_TABLET | Freq: Every day | ORAL | 5 refills | Status: DC

## 2018-11-10 MED ORDER — ALBUTEROL SULFATE (2.5 MG/3ML) 0.083% IN NEBU: 2.5000 mg | INHALATION_SOLUTION | Freq: Four times a day (QID) | RESPIRATORY_TRACT | 3 refills | Status: DC | PRN

## 2018-11-10 MED ORDER — LABETALOL HCL 200 MG PO TABS: 200.0000 mg | ORAL_TABLET | Freq: Three times a day (TID) | ORAL | 0 refills | Status: DC

## 2018-11-10 MED ORDER — MOMETASONE FURO-FORMOTEROL FUM 200-5 MCG/ACT IN AERO: 2.0000 | INHALATION_SPRAY | Freq: Two times a day (BID) | RESPIRATORY_TRACT | 5 refills | Status: DC

## 2018-11-10 NOTE — Patient Instructions (Addendum)
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

## 2018-11-10 NOTE — Assessment & Plan Note (Addendum)
COPD with recurrent bronchitis improved after smoking cessation Plan Maintain Dulera 2 puffs twice daily and as needed albuterol either through handheld inhaler or nebulizer Obtain f/u CXR

## 2018-11-10 NOTE — Assessment & Plan Note (Signed)
History of hypertension now well controlled on amlodipine and labetalol.  Note the patient does have a prescription for Lasix but he is only using this as needed for edema in the lower extremities and weight gain  Plan Refills on labetalol and Norvasc were obtained A basic metabolic panel to follow-up creatinine was obtained

## 2018-11-10 NOTE — Assessment & Plan Note (Signed)
This patient has mild sleep apnea and never obtained a CPAP machine.  His AHI is at 7 with desaturation in the 87% range based on a 2016 sleep study.  The plan will be to see if we can obtain for this patient a CPAP machine through the support program

## 2018-11-10 NOTE — Assessment & Plan Note (Signed)
History of chronic diastolic heart failure stable at this time  Continue current hypertensive medication profile

## 2018-11-10 NOTE — Assessment & Plan Note (Signed)
History of chronic kidney disease stage IV and need for follow-up lab data  Plan Obtain basic metabolic panel

## 2018-11-10 NOTE — Assessment & Plan Note (Signed)
History of gout stable at this time  We will follow-up with the of allopurinol and as needed seen

## 2018-11-10 NOTE — Assessment & Plan Note (Signed)
External otitis of the right ear with inflammatory component in the external auditory canal  Begin Cipro hydrocortisone eardrops to the right ear twice daily

## 2018-11-10 NOTE — Assessment & Plan Note (Signed)
Stable chronic hepatitis C We will follow-up liver function profile

## 2018-11-10 NOTE — Assessment & Plan Note (Signed)
History of tobacco use now resolved

## 2018-11-10 NOTE — Assessment & Plan Note (Signed)
Hyperlipidemia stable at this time  Will obtain lipid panel Maintain atorvastatin at current dose regimen

## 2018-11-10 NOTE — Assessment & Plan Note (Signed)
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

## 2018-11-10 NOTE — Progress Notes (Signed)
Subjective:    Patient ID: Johnny [name], male    DOB: [birth year], 58 y.o.   MRN: [id number]  57 y.o.M here for HTN re evaluation and mgmt.  Last seen in our clinic 02/2018  The patient has been doing relatively well overall. The patient does have an AV fistula in his left arm in place for 10 years and has not yet required access for hemodialysis.  The patient continues labetalol and amlodipine for blood pressure control.  An additional problem is the patient has obstructive sleep apnea diagnosed in 2016.  The patient has an AHI index of 7 and only mildly desaturates to 87% at night.  The patient is still having daytime sleepiness.  There is also memory change.  The patient did not have insurance coverage to afford CPAP therapy and desires to obtain a CPAP device now.  The patient additionally complains of a rash on the left palm of the hand.  He has been on a steroid cream in the past and is desiring a refill.  The patient does state he is no longer smoking.  He has a diagnosis of COPD.  He states his level of dyspnea is at baseline.  He does not require oxygen.  He maintains Dulera 2 puffs twice daily.  He also uses albuterol on as-needed basis.  Hypertension  This is a chronic problem. The current episode started more than 1 month ago. The problem has been rapidly improving since onset. The problem is controlled. Associated symptoms include anxiety, blurred vision, chest pain, malaise/fatigue and orthopnea. Pertinent negatives include no headaches, neck pain or palpitations. (Not able to sleep, something in his ear right Notes some pain on Left arm, vein is sore.) Risk factors for coronary artery disease include sedentary lifestyle, male gender and stress (stress issues: trying to accomplish activity. pt was homeless. try to finish school in [place]). Compliance problems include medication cost.  Hypertensive end-organ damage includes kidney disease. There is no history of angina, CVA, heart failure,  left ventricular hypertrophy, PVD or retinopathy. Identifiable causes of hypertension include chronic renal disease and sleep apnea. There is no history of coarctation of the aorta, hyperparathyroidism, a hypertension causing med, pheochromocytoma, renovascular disease or a thyroid problem.    Past Medical History:  Diagnosis Date  . Chronic kidney disease   . COPD (chronic obstructive pulmonary disease) ([place])   . Hepatitis C   . Hypertension   . OSA (obstructive sleep apnea) 11/03/2015     Family History  Problem Relation Age of Onset  . Hypertension Mother   . Diabetes Mother   . Colon cancer Neg Hx      Social History   Socioeconomic History  . Marital status: Divorced    Spouse name: Not on file  . Number of children: Not on file  . Years of education: 38  . Highest education level: Not on file  Occupational History  . Occupation: Electronics engineer  Social Needs  . Financial resource strain: Not on file  . Food insecurity:    Worry: Not on file    Inability: Not on file  . Transportation needs:    Medical: Not on file    Non-medical: Not on file  Tobacco Use  . Smoking status: Former Smoker    Packs/day: 0.10    Types: Cigarettes    Last attempt to quit: 04/02/2011    Years since quitting: 7.6  . Smokeless tobacco: Never Used  . Tobacco comment: trying to quit  Substance and Sexual Activity  . Alcohol use: Never    Alcohol/week: 0.0 standard drinks    Frequency: Never  . Drug use: No  . Sexual activity: Not Currently  Lifestyle  . Physical activity:    Days per week: Not on file    Minutes per session: Not on file  . Stress: Not on file  Relationships  . Social connections:    Talks on phone: Not on file    Gets together: Not on file    Attends religious service: Not on file    Active member of club or organization: Not on file    Attends meetings of clubs or organizations: Not on file    Relationship status: Not on file  . Intimate partner violence:      Fear of current or ex partner: Not on file    Emotionally abused: Not on file    Physically abused: Not on file    Forced sexual activity: Not on file  Other Topics Concern  . Not on file  Social History Narrative   Lives at home alone.   Right-handed.    Occasional caffeine use.     Allergies  Allergen Reactions  . Naproxen Other (See Comments)    Unknown     Outpatient Medications Prior to Visit  Medication Sig Dispense Refill  . albuterol (PROAIR HFA) 108 (90 Base) MCG/ACT inhaler Inhale 2 puffs into the lungs every 6 (six) hours as needed for wheezing or shortness of breath. 1 Inhaler 3  . albuterol (PROVENTIL) (2.5 MG/3ML) 0.083% nebulizer solution Take 3 mLs (2.5 mg total) by nebulization every 6 (six) hours as needed for wheezing or shortness of breath. 90 mL 3  . allopurinol (ZYLOPRIM) 100 MG tablet Take 2 tablets (200 mg total) by mouth daily. 60 tablet 5  . amLODipine (NORVASC) 10 MG tablet Take 1 tablet (10 mg total) by mouth daily. 30 tablet 5  . atorvastatin (LIPITOR) 40 MG tablet Take 1 tablet (40 mg total) by mouth daily. 30 tablet 3  . colchicine 0.6 MG tablet 2 tabs po x 1, then one tab po 1 hour later prn Gout flare 30 tablet 3  . furosemide (LASIX) 20 MG tablet Take 1 tablet (20 mg total) by mouth 2 (two) times daily. (Patient taking differently: Take 20 mg by mouth as needed for edema. ) 30 tablet 3  . labetalol (NORMODYNE) 200 MG tablet Take 1 tablet (200 mg total) by mouth 3 (three) times daily. 48 tablet 0  . mometasone-formoterol (DULERA) 200-5 MCG/ACT AERO Inhale 2 puffs into the lungs 2 (two) times daily. 13 g 5  . triamcinolone cream (KENALOG) 0.1 % Apply 1 application topically 2 (two) times daily. 45 g 1  . acetaminophen (TYLENOL) 500 MG tablet Take 1 tablet (500 mg total) by mouth every 6 (six) hours as needed. (Patient not taking: Reported on 11/10/2018) 30 tablet 0  . FLUoxetine (PROZAC) 20 MG tablet Take 1 tablet (20 mg total) by mouth daily. (Patient  not taking: Reported on 11/10/2018) 30 tablet 3  . nicotine (NICODERM CQ) 7 mg/24hr patch Place 1 patch (7 mg total) onto the skin daily. (Patient not taking: Reported on 11/10/2018) 28 patch 1   No facility-administered medications prior to visit.      Review of Systems  Constitutional: Positive for malaise/fatigue.  Eyes: Positive for blurred vision.  Cardiovascular: Positive for chest pain and orthopnea. Negative for palpitations.  Musculoskeletal: Negative for neck pain.  Neurological: Negative for  headaches.       Objective:   Physical Exam Vitals:   11/10/18 0944  BP: 133/79  Pulse: 81  Resp: 18  Temp: 98.2 F (36.8 C)  TempSrc: Oral  SpO2: 100%  Weight: 195 lb (88.5 kg)  Height: 6\' 1"  (1.854 m)    Gen: Pleasant, well-nourished, in no distress,  normal affect  ENT: There is evidence in the right ear of external otitis media with external auditory canal inflammation.  There is no evidence of any insect or other foreign material in the right external canal.  The left ear is normal,  mouth clear,  oropharynx clear, no postnasal drip  Neck: No JVD, no TMG, no carotid bruits  Lungs: No use of accessory muscles, no dullness to percussion, Distant breath sounds without wheezing  Cardiovascular: RRR, heart sounds normal, no murmur or gallops, no peripheral edema, there is a left upper extremity AV fistula with a palpable thrill.  Note there is dilation of the fistula and into an aneurysmal standpoint.  Abdomen: soft and NT, no HSM,  BS normal  Musculoskeletal: No deformities, no cyanosis or clubbing  Neuro: alert, non focal  Skin: Warm, there is evidence for dermatitis with dry skin in the palm of the left hand between the thumb and index finger  BMP Latest Ref Rng & Units 02/12/2018 08/30/2017 12/07/2016  Glucose 65 - 99 mg/dL 90 90 96  BUN 6 - 24 mg/dL 27(H) 39(H) 40(H)  Creatinine 0.76 - 1.27 mg/dL 2.91(H) 3.04(H) 2.78(H)  BUN/Creat Ratio 9 - 20 9 13  -  Sodium 134 -  144 mmol/L 141 140 139  Potassium 3.5 - 5.2 mmol/L 4.3 4.6 4.0  Chloride 96 - 106 mmol/L 102 100 106  CO2 20 - 29 mmol/L 22 26 25   Calcium 8.7 - 10.2 mg/dL 9.4 9.6 9.2   CBC Latest Ref Rng & Units 06/17/2017 09/03/2016 06/07/2016  WBC 3.4 - 10.8 x10E3/uL 11.7(H) 6.1 11.0(H)  Hemoglobin 13.0 - 17.7 g/dL 14.3 14.9 15.0  Hematocrit 37.5 - 51.0 % 42.2 42.0 42.9  Platelets 150 - 379 x10E3/uL 252 107(L) 157  CXR 06/2016   Mild CHF        Assessment & Plan:  I personally reviewed all images and lab data in the [facility] system as well as any outside material available during this office visit and agree with the  radiology impressions.   Essential hypertension History of hypertension now well controlled on amlodipine and labetalol.  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  I am concerned about the status of the AV fistula as there appears to be increasing aneurysmal formation.  We will obtain a vascular surgery consult on this patient  OSA (obstructive sleep apnea) This patient has mild sleep apnea and never obtained a CPAP machine.  His AHI is at 7 with desaturation in the 87% range based on a 2016 sleep study.  The plan will be to see if we can obtain for this patient a CPAP machine through the support program  COPD (chronic obstructive pulmonary disease) ([place]) COPD with recurrent bronchitis improved after smoking cessation Plan Maintain Dulera 2 puffs twice daily and as needed albuterol either  through handheld inhaler or nebulizer Obtain f/u CXR   Chronic hepatitis C without hepatic coma (HCC) Stable chronic hepatitis C We will follow-up liver function profile  External otitis of right  ear External otitis of the right ear with inflammatory component in the external auditory canal  Begin Cipro hydrocortisone eardrops to the right ear twice daily  Eczema of hand Eczema of the left hand which is recurrent  Will prescribe triamcinolone cream to be mixed with moisturizer to the skin of the left hand  CKD (chronic kidney disease) stage 4, GFR 15-29 ml/min (HCC) History of chronic kidney disease stage IV and need for follow-up lab data  Plan Obtain basic metabolic panel  Tobacco abuse History of tobacco use now resolved  Gout History of gout stable at this time  We will follow-up with the of allopurinol and as needed seen  Chronic diastolic congestive heart failure ([place]) History of chronic diastolic heart failure stable at this time  Continue current hypertensive medication profile  Hyperlipidemia Hyperlipidemia stable at this time  Will obtain lipid panel Maintain atorvastatin at current dose regimen   [place] was seen today for medication refill.  Diagnoses and all orders for this visit:  Essential hypertension -     labetalol (NORMODYNE) 200 MG tablet; Take 1 tablet (200 mg total) by mouth 3 (three) times daily. -     amLODipine (NORVASC) 10 MG tablet; Take 1 tablet (10 mg total) by mouth daily. -     Comprehensive metabolic panel -     DG Chest 2 View; Future  Eczema of hand -     CBC with Differential/Platelet; Future -     CBC with Differential/Platelet  CKD (chronic kidney disease) stage 4, GFR 15-29 ml/min (HCC) -     furosemide (LASIX) 20 MG tablet; Take 1 tablet (20 mg total) by mouth daily as needed for edema. -     Ambulatory referral to Vascular Surgery -     Comprehensive metabolic panel  Idiopathic chronic gout without tophus, unspecified site -     colchicine 0.6 MG tablet; 2 tabs po x 1, then one tab po 1 hour later prn Gout flare -     allopurinol (ZYLOPRIM) 100 MG tablet; Take 2 tablets (200 mg total) by mouth daily. -     CBC with  Differential/Platelet; Future -     CBC with Differential/Platelet  Anxiety and depression  OSA (obstructive sleep apnea) -     For home use only DME continuous positive airway pressure (CPAP)  Centrilobular emphysema ([place]) -     DG Chest 2 View; Future  Other infective acute otitis externa of right ear  A-V fistula (HCC) -     Ambulatory referral to Vascular Surgery -     CBC with Differential/Platelet; Future -     CBC with Differential/Platelet  Other atopic dermatitis -     triamcinolone cream (KENALOG) 0.1 %; Apply 1 application topically 2 (two) times daily.  Other emphysema ([place]) -     mometasone-formoterol (DULERA) 200-5 MCG/ACT AERO; Inhale 2 puffs into the lungs 2 (two) times daily. -     albuterol (PROVENTIL) (2.5 MG/3ML) 0.083% nebulizer solution; Take 3 mLs (2.5 mg total) by nebulization every 6 (six) hours as needed for wheezing or shortness of breath. -     albuterol (PROAIR HFA) 108 (90 Base) MCG/ACT inhaler; Inhale 2 puffs into the lungs every 6 (six) hours as needed for wheezing or shortness of breath. -  DG Chest 2 View; Future  Pure hypercholesterolemia -     Lipid Panel  Chronic hepatitis C without hepatic coma (HCC) -     Comprehensive metabolic panel  Tobacco abuse -     DG Chest 2 View; Future  Chronic diastolic congestive heart failure ([place])  Other orders -     atorvastatin (LIPITOR) 40 MG tablet; Take 1 tablet (40 mg total) by mouth daily. -     ciprofloxacin-hydrocortisone (CIPRO HC OTIC) OTIC suspension; Place 3 drops into the right ear 2 (two) times daily.

## 2018-11-10 NOTE — Assessment & Plan Note (Signed)
The patient has had a left AV fistula in the upper arm for 10 years.  I am concerned about the status of the AV fistula as there appears to be increasing aneurysmal formation.  We will obtain a vascular surgery consult on this patient

## 2018-11-11 ENCOUNTER — Telehealth: Payer: Self-pay

## 2018-11-11 LAB — CBC WITH DIFFERENTIAL/PLATELET
BASOS: 1 %
Basophils Absolute: 0.1 10*3/uL (ref 0.0–0.2)
EOS (ABSOLUTE): 0.6 10*3/uL — AB (ref 0.0–0.4)
Eos: 7 %
Hematocrit: 41 % (ref 37.5–51.0)
Hemoglobin: 14.4 g/dL (ref 13.0–17.7)
IMMATURE GRANS (ABS): 0 10*3/uL (ref 0.0–0.1)
IMMATURE GRANULOCYTES: 0 %
LYMPHS: 35 %
Lymphocytes Absolute: 3.1 10*3/uL (ref 0.7–3.1)
MCH: 28.5 pg (ref 26.6–33.0)
MCHC: 35.1 g/dL (ref 31.5–35.7)
MCV: 81 fL (ref 79–97)
MONOS ABS: 1 10*3/uL — AB (ref 0.1–0.9)
Monocytes: 12 %
NEUTROS PCT: 45 %
Neutrophils Absolute: 4 10*3/uL (ref 1.4–7.0)
PLATELETS: 227 10*3/uL (ref 150–450)
RBC: 5.05 x10E6/uL (ref 4.14–5.80)
RDW: 15.2 % (ref 11.6–15.4)
WBC: 8.8 10*3/uL (ref 3.4–10.8)

## 2018-11-11 LAB — COMPREHENSIVE METABOLIC PANEL
ALBUMIN: 3.9 g/dL (ref 3.5–5.5)
ALK PHOS: 80 IU/L (ref 39–117)
ALT: 27 IU/L (ref 0–44)
AST: 36 IU/L (ref 0–40)
Albumin/Globulin Ratio: 1.2 (ref 1.2–2.2)
BILIRUBIN TOTAL: 1 mg/dL (ref 0.0–1.2)
BUN / CREAT RATIO: 9 (ref 9–20)
BUN: 29 mg/dL — AB (ref 6–24)
CHLORIDE: 102 mmol/L (ref 96–106)
CO2: 24 mmol/L (ref 20–29)
CREATININE: 3.37 mg/dL — AB (ref 0.76–1.27)
Calcium: 9.4 mg/dL (ref 8.7–10.2)
GFR calc non Af Amer: 19 mL/min/{1.73_m2} — ABNORMAL LOW (ref >59)
GFR, EST AFRICAN AMERICAN: 22 mL/min/{1.73_m2} — AB (ref >59)
GLOBULIN, TOTAL: 3.3 g/dL (ref 1.5–4.5)
Glucose: 92 mg/dL (ref 65–99)
Potassium: 4.3 mmol/L (ref 3.5–5.2)
SODIUM: 140 mmol/L (ref 134–144)
TOTAL PROTEIN: 7.2 g/dL (ref 6.0–8.5)

## 2018-11-11 LAB — LIPID PANEL
CHOLESTEROL TOTAL: 214 mg/dL — AB (ref 100–199)
Chol/HDL Ratio: 2.2 ratio (ref 0.0–5.0)
HDL: 96 mg/dL (ref >39)
LDL CALC: 102 mg/dL — AB (ref 0–99)
Triglycerides: 80 mg/dL (ref 0–149)
VLDL Cholesterol Cal: 16 mg/dL (ref 5–40)

## 2018-11-11 NOTE — Telephone Encounter (Signed)
Attempted to contact the patient to discuss the CPAP assistance program through the [employer] as he is in need of a CPAP machine and has no insurance. Call placed twice to # [phone] and the message states that the call is not able to be completed at this time.  The patient needs to sign a waiver for the ASAA prior to proceeding with the application.

## 2018-11-13 ENCOUNTER — Telehealth: Payer: Self-pay

## 2018-11-13 NOTE — Telephone Encounter (Signed)
Attempted to contact the patient to discuss the CPAP assistance program through the [employer] as he is in need of a CPAP machine and has no insurance. Call placed to # [phone] and the message states that the person is not accepting calls at this time.  Call placed to friend, Broadus John # [phone] and hte voicemail was full.  The patient needs to sign a waiver for the ASAA prior to proceeding with the application.   Letter sent to patient requesting he call this CM to discuss CPAP

## 2018-11-18 MED FILL — LABETALOL HCL 200 MG TABLET: 200 | 16 days supply | Qty: 48 | Fill #0

## 2018-11-20 ENCOUNTER — Ambulatory Visit: Payer: Self-pay

## 2018-12-03 ENCOUNTER — Telehealth: Payer: Self-pay | Admitting: Family Medicine

## 2018-12-03 NOTE — Telephone Encounter (Signed)
Patient will be called with results by nurse that was with Dr.wright that day.

## 2018-12-03 NOTE — Telephone Encounter (Signed)
Pt called to update his new phone number and request to speak about our c-pap assistance program, please follow up to his new number [phone] p

## 2018-12-03 NOTE — Telephone Encounter (Signed)
Attempted to contact patient at new phone number noted to discuss CPAP assistance program.  Message left requesting a call back to this CM # [phone]

## 2018-12-03 NOTE — Telephone Encounter (Signed)
Pt called to request his lab results from 11/10/2018, please follow up

## 2018-12-04 ENCOUNTER — Telehealth: Payer: Self-pay

## 2018-12-04 NOTE — Telephone Encounter (Signed)
Attempted to contact patient  # [phone] to discuss CPAP assistance program.  Message left requesting a call back to this CM # [phone]

## 2018-12-15 ENCOUNTER — Other Ambulatory Visit: Payer: Self-pay | Admitting: Critical Care Medicine

## 2018-12-15 DIAGNOSIS — I10 Essential (primary) hypertension: Secondary | ICD-10-CM

## 2018-12-15 MED FILL — LABETALOL HCL 200 MG TABLET: 200 | 30 days supply | Qty: 90 | Fill #0

## 2018-12-15 NOTE — Telephone Encounter (Signed)
Follow up  1) Medication(s) Requested (by name): labetalol (NORMODYNE) 200 MG tablet  2) Pharmacy of Choice:  [facility]  3) Special Requests: Pt calling to check on his labetalol refill, edu that he needs appt to receive the medication, pt has an appt with the walkin on 2/12 and wants to know if he cant get just enough until his appt because he is completely out. Please f/u   Approved medications will be sent to the pharmacy, we will reach out if there is an issue.  Requests made after 3pm may not be addressed until the following business day!  If a patient is unsure of the name of the medication(s) please note and ask patient to call back when they are able to provide all info, do not send to responsible party until all information is available!

## 2018-12-17 ENCOUNTER — Ambulatory Visit: Payer: Self-pay

## 2018-12-26 ENCOUNTER — Encounter: Payer: Self-pay | Admitting: Gastroenterology

## 2019-01-12 ENCOUNTER — Ambulatory Visit: Payer: Self-pay | Admitting: Family Medicine

## 2019-01-13 ENCOUNTER — Other Ambulatory Visit: Payer: Self-pay | Admitting: Family Medicine

## 2019-01-13 DIAGNOSIS — I10 Essential (primary) hypertension: Secondary | ICD-10-CM

## 2019-01-13 MED FILL — !COLCRYS 0.6 MG TABLET: 0.6 MG | 15 days supply | Qty: 15 | Fill #1

## 2019-01-13 MED FILL — AMLODIPINE BESYLATE 10 MG T: 10 | 30 days supply | Qty: 30 | Fill #4

## 2019-01-13 MED FILL — PROAIR HFA 90 MCG INHALER: 108 (90 BAS | 25 days supply | Qty: 9 | Fill #1

## 2019-01-16 ENCOUNTER — Other Ambulatory Visit: Payer: Self-pay | Admitting: Family Medicine

## 2019-01-16 DIAGNOSIS — I10 Essential (primary) hypertension: Secondary | ICD-10-CM

## 2019-01-20 MED FILL — LABETALOL HCL 200 MG TABLET: 200 | 30 days supply | Qty: 90 | Fill #0

## 2019-01-21 ENCOUNTER — Other Ambulatory Visit: Payer: Self-pay

## 2019-01-21 ENCOUNTER — Emergency Department (HOSPITAL_COMMUNITY)
Admission: EM | Admit: 2019-01-21 | Discharge: 2019-01-21 | Disposition: A | Discharge status: Home / Self Care | Payer: Self-pay | Attending: Emergency Medicine | Admitting: Emergency Medicine

## 2019-01-21 ENCOUNTER — Encounter (HOSPITAL_COMMUNITY): Payer: Self-pay | Admitting: Emergency Medicine

## 2019-01-21 DIAGNOSIS — J40 Bronchitis, not specified as acute or chronic: Secondary | ICD-10-CM | POA: Insufficient documentation

## 2019-01-21 DIAGNOSIS — Z79899 Other long term (current) drug therapy: Secondary | ICD-10-CM | POA: Insufficient documentation

## 2019-01-21 DIAGNOSIS — I5032 Chronic diastolic (congestive) heart failure: Secondary | ICD-10-CM | POA: Insufficient documentation

## 2019-01-21 DIAGNOSIS — I130 Hypertensive heart and chronic kidney disease with heart failure and stage 1 through stage 4 chronic kidney disease, or unspecified chronic kidney disease: Secondary | ICD-10-CM | POA: Insufficient documentation

## 2019-01-21 DIAGNOSIS — J449 Chronic obstructive pulmonary disease, unspecified: Secondary | ICD-10-CM | POA: Insufficient documentation

## 2019-01-21 DIAGNOSIS — Z202 Contact with and (suspected) exposure to infections with a predominantly sexual mode of transmission: Secondary | ICD-10-CM | POA: Insufficient documentation

## 2019-01-21 DIAGNOSIS — E785 Hyperlipidemia, unspecified: Secondary | ICD-10-CM | POA: Insufficient documentation

## 2019-01-21 DIAGNOSIS — Z87891 Personal history of nicotine dependence: Secondary | ICD-10-CM | POA: Insufficient documentation

## 2019-01-21 DIAGNOSIS — N184 Chronic kidney disease, stage 4 (severe): Secondary | ICD-10-CM | POA: Insufficient documentation

## 2019-01-21 LAB — URINALYSIS, ROUTINE W REFLEX MICROSCOPIC
Bacteria, UA: NONE SEEN
Bilirubin Urine: NEGATIVE
Glucose, UA: NEGATIVE mg/dL
KETONES UR: NEGATIVE mg/dL
Leukocytes,Ua: NEGATIVE
NITRITE: NEGATIVE
Protein, ur: >=300 mg/dL — AB
Specific Gravity, Urine: 1.019 (ref 1.005–1.030)
pH: 5 (ref 5.0–8.0)

## 2019-01-21 MED ORDER — PREDNISONE 10 MG PO TABS: 50.0000 mg | ORAL_TABLET | Freq: Every day | ORAL | 0 refills | Status: DC

## 2019-01-21 MED ORDER — ALBUTEROL SULFATE HFA 108 (90 BASE) MCG/ACT IN AERS: 1.0000 | INHALATION_SPRAY | Freq: Four times a day (QID) | RESPIRATORY_TRACT | 0 refills | Status: DC | PRN

## 2019-01-21 MED ORDER — CEFTRIAXONE SODIUM 250 MG IJ SOLR
250.0000 mg | Freq: Once | INTRAMUSCULAR | Status: AC
  Administered 2019-01-21: 250 mg via INTRAMUSCULAR
  Filled 2019-01-21: qty 250

## 2019-01-21 MED ORDER — LIDOCAINE HCL (PF) 1 % IJ SOLN
INTRAMUSCULAR | Status: AC
  Administered 2019-01-21: 5 mL
  Filled 2019-01-21: qty 5

## 2019-01-21 MED ORDER — METRONIDAZOLE 500 MG PO TABS
2000.0000 mg | ORAL_TABLET | Freq: Once | ORAL | Status: AC
  Administered 2019-01-21: 2000 mg via ORAL
  Filled 2019-01-21: qty 4

## 2019-01-21 MED ORDER — AZITHROMYCIN 250 MG PO TABS
1000.0000 mg | ORAL_TABLET | Freq: Once | ORAL | Status: AC
Start: 2019-01-21 — End: 2019-01-21
  Administered 2019-01-21: 1000 mg via ORAL
  Filled 2019-01-21: qty 4

## 2019-01-21 MED ORDER — IPRATROPIUM-ALBUTEROL 0.5-2.5 (3) MG/3ML IN SOLN
3.0000 mL | Freq: Once | RESPIRATORY_TRACT | Status: AC
  Administered 2019-01-21: 3 mL via RESPIRATORY_TRACT
  Filled 2019-01-21: qty 3

## 2019-01-21 MED ORDER — PREDNISONE 20 MG PO TABS
60.0000 mg | ORAL_TABLET | Freq: Once | ORAL | Status: AC
  Administered 2019-01-21: 60 mg via ORAL
  Filled 2019-01-21: qty 3

## 2019-01-21 NOTE — ED Notes (Signed)
Rn called lab to check status of urinalysis, lab reports they never received a urine specimen

## 2019-01-21 NOTE — ED Notes (Signed)
This RN collected another urine sample and is sending it to the lab at this time

## 2019-01-21 NOTE — ED Provider Notes (Signed)
[facility] EMERGENCY DEPARTMENT Provider Note   CSN: [id number] Arrival date & time: 01/21/19  1716    History   Chief Complaint Chief Complaint  Patient presents with  . Multiple Complaints    HPI Johnny [name] is a 58 y.o. male.     HPI  58 year old male comes in with chief complaint of STD concerns and shortness of breath. Patient has history of COPD.  He reports that he is been having some cough that is new along with wheezing.  He denies any significant shortness of breath.  He denies any associated fevers, chills.  Patient states that his girlfriend has also been having URI-like symptoms.  Additionally patient reports that he is concerned for STDs.  He has been having some urinary discomfort, and there is some tingling at the end of his urine stream.  He denies any penile discharge, scrotal rash, penile rash.  He does admit to having unprotected intercourse with a woman that he is not in a monogamous relationship with.  Past Medical History:  Diagnosis Date  . Adenomatous colon polyp    tubular  . Chronic kidney disease   . COPD (chronic obstructive pulmonary disease) ([place])   . Diverticulosis   . Hepatitis C   . Hypertension   . OSA (obstructive sleep apnea) 11/03/2015    Patient Active Problem List   Diagnosis Date Noted  . Eczema of hand 11/10/2018  . External otitis of right ear 11/10/2018  . A-V fistula ([place]) 11/10/2018  . Chronic diastolic congestive heart failure ([place]) 11/10/2018  . Hyperlipidemia 11/10/2018  . Anxiety and depression 02/12/2018  . Tobacco abuse 12/07/2016  . Gout 12/07/2016  . COPD (chronic obstructive pulmonary disease) ([place]) 06/07/2016  . CKD (chronic kidney disease) stage 4, GFR 15-29 ml/min (HCC) 06/07/2016  . Essential hypertension 06/07/2016  . Chronic hepatitis C without hepatic coma ([place]) 03/21/2016  . OSA (obstructive sleep apnea) 11/03/2015    Past Surgical History:  Procedure Laterality Date  . AV  FISTULA PLACEMENT  09/12/2009   Left arm AVF        Home Medications    Prior to Admission medications   Medication Sig Start Date End Date Taking? Authorizing Provider  albuterol (PROAIR HFA) 108 (90 Base) MCG/ACT inhaler Inhale 2 puffs into the lungs every 6 (six) hours as needed for wheezing or shortness of breath. 11/10/18   Elsie Stain, MD  albuterol (PROVENTIL) (2.5 MG/3ML) 0.083% nebulizer solution Take 3 mLs (2.5 mg total) by nebulization every 6 (six) hours as needed for wheezing or shortness of breath. 11/10/18   Elsie Stain, MD  allopurinol (ZYLOPRIM) 100 MG tablet Take 2 tablets (200 mg total) by mouth daily. 11/10/18   Elsie Stain, MD  amLODipine (NORVASC) 10 MG tablet Take 1 tablet (10 mg total) by mouth daily. 11/10/18   Elsie Stain, MD  atorvastatin (LIPITOR) 40 MG tablet Take 1 tablet (40 mg total) by mouth daily. 11/10/18   Elsie Stain, MD  ciprofloxacin-hydrocortisone (CIPRO HC OTIC) OTIC suspension Place 3 drops into the right ear 2 (two) times daily. 11/10/18   Elsie Stain, MD  colchicine 0.6 MG tablet 2 tabs po x 1, then one tab po 1 hour later prn Gout flare 11/10/18   Elsie Stain, MD  furosemide (LASIX) 20 MG tablet Take 1 tablet (20 mg total) by mouth daily as needed for edema. 11/10/18   Elsie Stain, MD  labetalol (NORMODYNE) 200 MG  tablet Take 1 tablet (200 mg total) by mouth 3 (three) times daily. MUST MAKE APPT FOR FURTHER REFILLS 01/20/19   Charlott Rakes, MD  mometasone-formoterol (DULERA) 200-5 MCG/ACT AERO Inhale 2 puffs into the lungs 2 (two) times daily. 11/10/18   Elsie Stain, MD  triamcinolone cream (KENALOG) 0.1 % Apply 1 application topically 2 (two) times daily. 11/10/18   Elsie Stain, MD    Family History Family History  Problem Relation Age of Onset  . Hypertension Mother   . Diabetes Mother   . Colon cancer Neg Hx     Social History Social History   Tobacco Use  . Smoking status: Former Smoker     Packs/day: 0.10    Types: Cigarettes    Last attempt to quit: 04/02/2011    Years since quitting: 7.8  . Smokeless tobacco: Never Used  . Tobacco comment: trying to quit  Substance Use Topics  . Alcohol use: Never    Alcohol/week: 0.0 standard drinks    Frequency: Never  . Drug use: No     Allergies   Naproxen   Review of Systems Review of Systems  Constitutional: Positive for activity change.  Respiratory: Positive for shortness of breath.   Genitourinary: Positive for dysuria.  Allergic/Immunologic: Negative for immunocompromised state.  All other systems reviewed and are negative.    Physical Exam Updated Vital Signs BP (!) 152/101 (BP Location: Right Arm)   Pulse 75   Temp 98.1 F (36.7 C) (Oral)   Resp 16   SpO2 95%   Physical Exam Vitals signs and nursing note reviewed.  Constitutional:      Appearance: He is well-developed.  HENT:     Head: Atraumatic.  Neck:     Musculoskeletal: Neck supple.  Cardiovascular:     Rate and Rhythm: Normal rate.  Pulmonary:     Effort: Pulmonary effort is normal. No respiratory distress.     Breath sounds: Wheezing present.  Skin:    General: Skin is warm.  Neurological:     Mental Status: He is alert and oriented to person, place, and time.      ED Treatments / Results  Labs (all labs ordered are listed, but only abnormal results are displayed) Labs Reviewed  HIV ANTIBODY (ROUTINE TESTING W REFLEX)  GC/CHLAMYDIA PROBE AMP () NOT AT [facility]    EKG None  Radiology No results found.  Procedures Procedures (including critical care time)  Medications Ordered in ED Medications  azithromycin (ZITHROMAX) tablet 1,000 mg (1,000 mg Oral Given 01/21/19 1903)  metroNIDAZOLE (FLAGYL) tablet 2,000 mg (2,000 mg Oral Given 01/21/19 1901)  cefTRIAXone (ROCEPHIN) injection 250 mg (250 mg Intramuscular Given 01/21/19 1904)  ipratropium-albuterol (DUONEB) 0.5-2.5 (3) MG/3ML nebulizer solution 3 mL (3 mLs  Nebulization Given 01/21/19 1907)  predniSONE (DELTASONE) tablet 60 mg (60 mg Oral Given 01/21/19 1903)  lidocaine (PF) (XYLOCAINE) 1 % injection (5 mLs  Given 01/21/19 1904)     Initial Impression / Assessment and Plan / ED Course  I have reviewed the triage vital signs and the nursing notes.  Pertinent labs & imaging results that were available during my care of the patient were reviewed by me and considered in my medical decision making (see chart for details).        Patient seen in the ER for shortness of breath and also urinary symptoms.  It appears that he is concerned for STD, and with the symptoms he is having we will treat him for  STD exposure.  He has agreed to getting tested for HIV.  Patient was also having wheezing that is generalized without any shortness of breath, fevers.  He has been having new cough.  He has history of COPD.  We will give him antibiotics and prednisone.  Given this findings of shortness of breath with generalized wheezing in the setting of patient with COPD and no high risk features for coronavirus infection we do not think he has underlying coronavirus causing the shortness of breath, rather he is having COPD exacerbation because of typical viral illness.  Final Clinical Impressions(s) / ED Diagnoses   Final diagnoses:  Bronchitis  Exposure to STD    ED Discharge Orders    None       Varney Biles, MD 01/21/19 1929

## 2019-01-21 NOTE — ED Triage Notes (Signed)
Patient reports multiple complaints - starts out with requesting STD testing, stating he has been exposed, but not elaborating further. Then reports history of respiratory problems and cough with phlegm, but no recent travel or sick contacts. He endorses sweats, but no fevers. Also reports urinary frequency and burning x 3-4 weeks. Resp e/u.

## 2019-01-21 NOTE — Discharge Instructions (Addendum)
Free HIV and STD Testing °These locations offer FREE confidential testing for HIV, Chlamydia, Gonorrhea, and Syphilis. °Non-Traditional Testing Sites Address Telephone ° °[facility] [address], °[place] °[phone] °Mondays 5pm - 7pm ° °[facility] °[address], [address] °[place] °[phone] °Wednesdays 2pm-8pm ° °[facility] °[address], °[place] °[phone] °Thursdays 9am-12noon °1pm-4pm ° °[facility] °[address], [place] °[phone] °Tuesdays °Thursdays °9am-12noon °1pm-4pm ° °[facility] offers free, confidential testing and treatment for HIV, Chlamydia, Gonorrhea, Syphilis, Herpes, Bacterial Vaginosis, Yeast, and Trichomoniasis. °Traditional Testing ° ° °[facility] °[address], °[place] °[phone]  °Monday thru Friday  °Call for an appointment ° °[facility] [place] °[facility] °[address] °[place] °[phone] °Monday thru Friday  °Call for anappointment. ° °If you have any questions about this information please call [phone]. °09/13/2011 ° °

## 2019-01-22 LAB — URINE CULTURE: Culture: NO GROWTH

## 2019-01-22 LAB — GC/CHLAMYDIA PROBE AMP (~~LOC~~) NOT AT ARMC
Chlamydia: NEGATIVE
Neisseria Gonorrhea: NEGATIVE

## 2019-01-22 LAB — HIV ANTIBODY (ROUTINE TESTING W REFLEX): HIV SCREEN 4TH GENERATION: NONREACTIVE

## 2019-01-27 ENCOUNTER — Encounter: Payer: Self-pay | Admitting: Gastroenterology

## 2019-02-27 ENCOUNTER — Other Ambulatory Visit: Payer: Self-pay | Admitting: Family Medicine

## 2019-02-27 DIAGNOSIS — I10 Essential (primary) hypertension: Secondary | ICD-10-CM

## 2019-02-27 MED FILL — ?AMLODIPINE BESYLATE 10 MG: 10 | 90 days supply | Qty: 90 | Fill #0

## 2019-02-27 MED FILL — TRIAMCINOLONE ACETONIDE 0.1: 0.1 | 15 days supply | Qty: 45 | Fill #0

## 2019-02-27 NOTE — Telephone Encounter (Signed)
New Message   1) Medication(s) Requested (by name): amLODipine (NORVASC) 10 MG tablet, labetalol (NORMODYNE) 200 MG tablet, triamcinolone cream (KENALOG) 0.1 %, albuterol (PROVENTIL HFA;VENTOLIN HFA) 108 (90 Base) MCG/ACT inhaler  2) Pharmacy of Choice: [facility]  3) Special Requests:   Approved medications will be sent to the pharmacy, we will reach out if there is an issue.  Requests made after 3pm may not be addressed until the following business day!  If a patient is unsure of the name of the medication(s) please note and ask patient to call back when they are able to provide all info, do not send to responsible party until all information is available!

## 2019-02-28 ENCOUNTER — Other Ambulatory Visit: Payer: Self-pay | Admitting: Family Medicine

## 2019-02-28 DIAGNOSIS — I10 Essential (primary) hypertension: Secondary | ICD-10-CM

## 2019-02-28 MED FILL — ?ALBUTEROL SUL 2.5 MG/3 MLS: (2.5 MG/3ML | 7 days supply | Qty: 90 | Fill #0

## 2019-02-28 MED FILL — !VENTOLIN HFA INHALER: 108 (90 BAS | 25 days supply | Qty: 18 | Fill #0

## 2019-02-28 MED FILL — !COLCRYS 0.6 MG TABLET: 0.6 MG | 10 days supply | Qty: 30 | Fill #0

## 2019-03-02 MED ORDER — ALBUTEROL SULFATE HFA 108 (90 BASE) MCG/ACT IN AERS: 1.0000 | INHALATION_SPRAY | Freq: Four times a day (QID) | RESPIRATORY_TRACT | 0 refills | Status: DC | PRN

## 2019-03-02 MED ORDER — LABETALOL HCL 200 MG PO TABS: 200.0000 mg | ORAL_TABLET | Freq: Three times a day (TID) | ORAL | 0 refills | Status: DC

## 2019-03-02 MED FILL — LABETALOL HCL 200 MG TABLET: 200 | 30 days supply | Qty: 90 | Fill #0

## 2019-03-02 NOTE — Telephone Encounter (Signed)
1) Medication(s) Requested (by name):labetalol (NORMODYNE) 200 MG tablet [[id number]]   2) Pharmacy of Choice: [facility]   3) Special Requests:   Approved medications will be sent to the pharmacy, we will reach out if there is an issue.  Requests made after 3pm may not be addressed until the following business day!  If a patient is unsure of the name of the medication(s) please note and ask patient to call back when they are able to provide all info, do not send to responsible party until all information is available!

## 2019-03-11 ENCOUNTER — Ambulatory Visit: Payer: Self-pay | Admitting: Family Medicine

## 2019-04-09 ENCOUNTER — Other Ambulatory Visit: Payer: Self-pay | Admitting: Family Medicine

## 2019-04-09 DIAGNOSIS — I10 Essential (primary) hypertension: Secondary | ICD-10-CM

## 2019-04-10 ENCOUNTER — Other Ambulatory Visit: Payer: Self-pay

## 2019-04-10 ENCOUNTER — Encounter: Payer: Self-pay | Admitting: Family Medicine

## 2019-04-10 ENCOUNTER — Ambulatory Visit: Payer: Self-pay | Attending: Family Medicine | Admitting: Family Medicine

## 2019-04-10 VITALS — BP 143/82 | HR 86 | Temp 98.4°F | Resp 18 | Ht 73.0 in | Wt 199.0 lb

## 2019-04-10 DIAGNOSIS — L03116 Cellulitis of left lower limb: Secondary | ICD-10-CM

## 2019-04-10 DIAGNOSIS — Z202 Contact with and (suspected) exposure to infections with a predominantly sexual mode of transmission: Secondary | ICD-10-CM

## 2019-04-10 DIAGNOSIS — R319 Hematuria, unspecified: Secondary | ICD-10-CM

## 2019-04-10 DIAGNOSIS — I10 Essential (primary) hypertension: Secondary | ICD-10-CM

## 2019-04-10 DIAGNOSIS — N184 Chronic kidney disease, stage 4 (severe): Secondary | ICD-10-CM

## 2019-04-10 LAB — POCT URINALYSIS DIP (CLINITEK)
Bilirubin, UA: NEGATIVE
Glucose, UA: NEGATIVE mg/dL
Ketones, POC UA: NEGATIVE mg/dL
Leukocytes, UA: NEGATIVE
Nitrite, UA: NEGATIVE
POC PROTEIN,UA: >=300 — AB
Spec Grav, UA: 1.025
Urobilinogen, UA: 1 U/dL
pH, UA: 5.5

## 2019-04-10 MED ORDER — LABETALOL HCL 200 MG PO TABS: 200.0000 mg | ORAL_TABLET | Freq: Three times a day (TID) | ORAL | 3 refills | Status: DC

## 2019-04-10 MED ORDER — CEPHALEXIN 500 MG PO CAPS: 500.0000 mg | ORAL_CAPSULE | Freq: Two times a day (BID) | ORAL | 0 refills | Status: AC

## 2019-04-10 MED FILL — CEPHALEXIN 500 MG CAPSULE: 500 | 7 days supply | Qty: 14 | Fill #0

## 2019-04-10 MED FILL — LABETALOL HCL 200 MG TABLET: 200 | 30 days supply | Qty: 90 | Fill #0

## 2019-04-10 NOTE — Progress Notes (Signed)
Established Patient Office Visit  Subjective:  Patient ID: Johnny [name], male    DOB: February 17, 1961  Age: 58 y.o. MRN: [id number]  CC: No chief complaint on file.   HPI FREMAN LAPAGE presents for concern due to possible STD exposure and patient with a history of CKD with most recent Cr of 3.37 on labs done 11/10/2018.  He is also status post  emergency department visit on 01/21/2019 and per emergency department records, patient had multiple complaints including STD concerns of abnormal sensation at the end of urination and unprotected sexual intercourse as well as  shortness of breath with history of COPD.  He did receive treatment at that time with 1 g of azithromycin, 2 g of metronidazole, Rocephin 250 mg IM x1 as treatment in case of sexually transmitted infection.  He was also treated with DuoNeb and given prednisone for presumed bronchitis due to complaints of shortness of breath as well as wheezing on exam.      At today's visit, patient reports that he has had unprotected sexual intercourse and would like to have testing for sexually transmitted infections.  He denies any current dysuria, no penile discharge and no penile lesions.  He does need refill of his blood pressure medication.  Patient reports that he was not really aware of any significant issues with his kidney function.  He denies any urinary symptoms at today's visit such as dysuria, decreased urine production, back pain or abnormal color/odor to the urine.  Patient has noticed some increased redness to his legs and that his skin tends to be dry and itchy and he believes the redness is related to scratching his legs due to the itchiness.  He denies any fever or chills.  He denies headaches or dizziness related to his blood pressure.  He has had no chest pain or palpitations.  No abdominal pain-no nausea/vomiting/diarrhea or constipation.  He feels that his bronchitis has resolved as he no longer has any sensation of chest congestion, no  shortness of breath or cough.  He has had some mild leg swelling which tends to go down overnight.  Past Medical History:  Diagnosis Date  . Adenomatous colon polyp    tubular  . Chronic kidney disease   . COPD (chronic obstructive pulmonary disease) ([place])   . Diverticulosis   . Hepatitis C   . Hypertension   . OSA (obstructive sleep apnea) 11/03/2015    Past Surgical History:  Procedure Laterality Date  . AV FISTULA PLACEMENT  09/12/2009   Left arm AVF    Family History  Problem Relation Age of Onset  . Hypertension Mother   . Diabetes Mother   . Colon cancer Neg Hx     Social History   Tobacco Use  . Smoking status: Former Smoker    Packs/day: 0.10    Types: Cigarettes    Quit date: 04/02/2011    Years since quitting: 8.0  . Smokeless tobacco: Never Used  . Tobacco comment: trying to quit  Substance Use Topics  . Alcohol use: Never    Alcohol/week: 0.0 standard drinks    Frequency: Never  . Drug use: No    Outpatient Medications Prior to Visit  Medication Sig Dispense Refill  . albuterol (VENTOLIN HFA) 108 (90 Base) MCG/ACT inhaler Inhale 1-2 puffs into the lungs every 6 (six) hours as needed for wheezing or shortness of breath. 1 Inhaler 0  . allopurinol (ZYLOPRIM) 100 MG tablet Take 2 tablets (200 mg total) by  mouth daily. 60 tablet 5  . amLODipine (NORVASC) 10 MG tablet Take 1 tablet (10 mg total) by mouth daily. 30 tablet 5  . atorvastatin (LIPITOR) 40 MG tablet Take 1 tablet (40 mg total) by mouth daily. 30 tablet 3  . ciprofloxacin-hydrocortisone (CIPRO HC OTIC) OTIC suspension Place 3 drops into the right ear 2 (two) times daily. 10 mL 0  . colchicine 0.6 MG tablet 2 tabs po x 1, then one tab po 1 hour later prn Gout flare 30 tablet 3  . furosemide (LASIX) 20 MG tablet Take 1 tablet (20 mg total) by mouth daily as needed for edema. 30 tablet 3  . labetalol (NORMODYNE) 200 MG tablet Take 1 tablet (200 mg total) by mouth 3 (three) times daily. MUST MAKE APPT  FOR FURTHER REFILLS 90 tablet 0  . mometasone-formoterol (DULERA) 200-5 MCG/ACT AERO Inhale 2 puffs into the lungs 2 (two) times daily. 13 g 5  . predniSONE (DELTASONE) 10 MG tablet Take 5 tablets (50 mg total) by mouth daily. 25 tablet 0  . triamcinolone cream (KENALOG) 0.1 % Apply 1 application topically 2 (two) times daily. 45 g 1   No facility-administered medications prior to visit.     Allergies  Allergen Reactions  . Naproxen Other (See Comments)    Unknown    ROS Review of Systems  Constitutional: Negative for chills and fever.  HENT: Negative for sore throat and trouble swallowing.   Eyes: Negative for photophobia and visual disturbance.  Respiratory: Negative for cough and shortness of breath.   Cardiovascular: Negative for chest pain and palpitations.  Gastrointestinal: Negative for abdominal pain, constipation, diarrhea and nausea.  Endocrine: Negative for polydipsia, polyphagia and polyuria.  Genitourinary: Negative for difficulty urinating and dysuria.  Musculoskeletal: Negative for back pain and gait problem.  Neurological: Negative for dizziness and headaches.  Hematological: Negative for adenopathy. Does not bruise/bleed easily.      Objective:    Physical Exam  Constitutional: He is oriented to person, place, and time. He appears well-developed and well-nourished.  Overweight older male in no acute distress  Neck: Normal range of motion. Neck supple. No JVD present.  Cardiovascular: Normal rate.  Pulmonary/Chest: Effort normal and breath sounds normal.  Abdominal: There is no abdominal tenderness. There is no rebound and no guarding.  Musculoskeletal:        General: Edema present. No tenderness.  Lymphadenopathy:    He has no cervical adenopathy.  Neurological: He is alert and oriented to person, place, and time.  Skin: Skin is warm and dry. There is erythema.  Mild bilateral distal lower extremity edema, nonpitting.  Patient with some excoriations on  the lower extremities bilateral LE with mild increased warmth and redness in the areas of the excoriations  Psychiatric: He has a normal mood and affect. His behavior is normal. Judgment and thought content normal.  Nursing note and vitals reviewed.   BP (!) 143/82 (BP Location: Left Arm, Patient Position: Sitting, Cuff Size: Normal)   Pulse 86   Temp 98.4 F (36.9 C) (Oral)   Resp 18   Ht '6\' 1"'$  (1.854 m)   Wt 199 lb (90.3 kg)   SpO2 96%   BMI 26.25 kg/m  Wt Readings from Last 3 Encounters:  11/10/18 195 lb (88.5 kg)  02/12/18 199 lb (90.3 kg)  10/02/17 202 lb 12.8 oz (92 kg)     Health Maintenance Due  Topic Date Due  . TETANUS/TDAP  03/25/1982  . COLONOSCOPY  01/15/2019  There are no preventive care reminders to display for this patient.  Lab Results  Component Value Date   TSH 1.440 02/02/2016   Lab Results  Component Value Date   WBC 8.8 11/10/2018   HGB 14.4 11/10/2018   HCT 41.0 11/10/2018   MCV 81 11/10/2018   PLT 227 11/10/2018   Lab Results  Component Value Date   NA 140 11/10/2018   K 4.3 11/10/2018   CO2 24 11/10/2018   GLUCOSE 92 11/10/2018   BUN 29 (H) 11/10/2018   CREATININE 3.37 (H) 11/10/2018   BILITOT 1.0 11/10/2018   ALKPHOS 80 11/10/2018   AST 36 11/10/2018   ALT 27 11/10/2018   PROT 7.2 11/10/2018   ALBUMIN 3.9 11/10/2018   CALCIUM 9.4 11/10/2018   ANIONGAP 10 09/03/2016   Lab Results  Component Value Date   CHOL 214 (H) 11/10/2018   Lab Results  Component Value Date   HDL 96 11/10/2018   Lab Results  Component Value Date   LDLCALC 102 (H) 11/10/2018   Lab Results  Component Value Date   TRIG 80 11/10/2018   Lab Results  Component Value Date   CHOLHDL 2.2 11/10/2018   Lab Results  Component Value Date   HGBA1C  02/21/2009    5.3 (NOTE) The ADA recommends the following therapeutic goal for glycemic control related to Hgb A1c measurement: Goal of therapy: <6.5 Hgb A1c  Reference: American Diabetes Association:  Clinical Practice Recommendations 2010, Diabetes Care, 2010, 33: (Suppl  1).      Assessment & Plan:  1. CKD (chronic kidney disease) stage 4, GFR 15-29 ml/min (HCC) Patient with chronic kidney disease and will be referred to nephrology for further evaluation.  We will recheck creatinine and electrolytes as part of BMP at today's visit.  Patient will also have urinalysis.  Patient with creatinine of 3.37 with EGFR of 22 in January of this year.  Avoid nonsteroidal anti-inflammatories which can further damage the kidneys - Basic Metabolic Panel - POCT URINALYSIS DIP (CLINITEK) - Ambulatory referral to Nephrology  2. Possible exposure to STD Patient with concern regarding possible exposure to STDs/STIs.  Patient will have blood test for syphilis.  He has had testing for HIV earlier this year and declines HIV testing at this time.  Patient will have urine cytology for gonorrhea, chlamydia and trichomonas. - Urine cytology ancillary only - [facility]  3. Essential hypertension Patient is provided with refill of his blood pressure medication, labetalol.  DASH diet encouraged.  Importance of controlling blood pressure with goal of 130/80 or less especially in light of his chronic kidney disease.  Referral placed for nephrology follow-up - Basic Metabolic Panel - POCT URINALYSIS DIP (CLINITEK) - labetalol (NORMODYNE) 200 MG tablet; Take 1 tablet (200 mg total) by mouth 3 (three) times daily.  Dispense: 90 tablet; Refill: 3 - Ambulatory referral to Nephrology  4. Cellulitis of left lower extremity Patient with some cellulitis of the left lower extremity where patient has previously scratched the area.  Prescription for Keflex 500 mg twice daily x7 days and signs/symptoms of worsening infection discussed with the patient and he should seek further medical attention should these occur - cephALEXin (KEFLEX) 500 MG capsule; Take 1 capsule (500 mg total) by mouth 2 (two) times daily for 7 days. For infection   Dispense: 14 capsule; Refill: 0   An After Visit Summary was printed and given to the patient.  Follow-up: Return for Hypertension/chronic kidney disease-3 months with PCP; 1 to 2  weeks if leg/skin infection has not im.   Antony Blackbird, MD

## 2019-04-10 NOTE — Patient Instructions (Signed)
Chronic Kidney Disease, Adult Chronic kidney disease (CKD) happens when the kidneys are damaged over a long period of time. The kidneys are two organs that help with:  Getting rid of waste and extra fluid from the blood.  Making hormones that maintain the amount of fluid in your tissues and blood vessels.  Making sure that the body has the right amount of fluids and chemicals. Most of the time, CKD does not go away, but it can usually be controlled. Steps must be taken to slow down the kidney damage or to stop it from getting worse. If this is not done, the kidneys may stop working. Follow these instructions at home: Medicines  Take over-the-counter and prescription medicines only as told by your doctor. You may need to change the amount of medicines you take.  Do not take any new medicines unless your doctor says it is okay. Many medicines can make your kidney damage worse.  Do not take any vitamin and supplements unless your doctor says it is okay. Many vitamins and supplements can make your kidney damage worse. General instructions  Follow a diet as told by your doctor. You may need to stay away from: ? Alcohol. ? Salty foods. ? Foods that are high in:  Potassium.  Calcium.  Protein.  Do not use any products that contain nicotine or tobacco, such as cigarettes and e-cigarettes. If you need help quitting, ask your doctor.  Keep track of your blood pressure at home. Tell your doctor about any changes.  If you have diabetes, keep track of your blood sugar as told by your doctor.  Try to stay at a healthy weight. If you need help, ask your doctor.  Exercise at least 30 minutes a day, 5 days a week.  Stay up-to-date with your shots (immunizations) as told by your doctor.  Keep all follow-up visits as told by your doctor. This is important. Contact a doctor if:  Your symptoms get worse.  You have new symptoms. Get help right away if:  You have symptoms of end-stage  kidney disease. These may include: ? Headaches. ? Numbness in your hands or feet. ? Easy bruising. ? Having hiccups often. ? Chest pain. ? Shortness of breath. ? Stopping of menstrual periods in women.  You have a fever.  You have very little pee (urine).  You have pain or bleeding when you pee. Summary  Chronic kidney disease (CKD) happens when the kidneys are damaged over a long period of time.  Most of the time, this condition does not go away, but it can usually be controlled. Steps must be taken to slow down the kidney damage or to stop it from getting worse.  Treatment may include a combination of medicines and lifestyle changes. This information is not intended to replace advice given to you by your health care provider. Make sure you discuss any questions you have with your health care provider. Document Released: 01/16/2010 Document Revised: 11/26/2016 Document Reviewed: 11/26/2016 Elsevier Interactive Patient Education  2019 Else[employer] Inc.  Cellulitis, Adult  Cellulitis is a skin infection. The infected area is often warm, red, swollen, and sore. It occurs most often in the arms and lower legs. It is very important to get treated for this condition. What are the causes? This condition is caused by bacteria. The bacteria enter through a break in the skin, such as a cut, burn, insect bite, open sore, or crack. What increases the risk? This condition is more likely to occur in  people who:  Have a weak body defense system (immune system).  Have open cuts, burns, bites, or scrapes on the skin.  Are older than 56 years of age.  Have a blood sugar problem (diabetes).  Have a long-lasting (chronic) liver disease (cirrhosis) or kidney disease.  Are very overweight (obese).  Have a skin problem, such as: ? Itchy rash (eczema). ? Slow movement of blood in the veins (venous stasis). ? Fluid buildup below the skin (edema).  Have been treated with high-energy rays  (radiation).  Use IV drugs. What are the signs or symptoms? Symptoms of this condition include:  Skin that is: ? Red. ? Streaking. ? Spotting. ? Swollen. ? Sore or painful when you touch it. ? Warm.  A fever.  Chills.  Blisters. How is this diagnosed? This condition is diagnosed based on:  Medical history.  Physical exam.  Blood tests.  Imaging tests. How is this treated? Treatment for this condition may include:  Medicines to treat infections or allergies.  Home care, such as: ? Rest. ? Placing cold or warm cloths (compresses) on the skin.  Hospital care, if the condition is very bad. Follow these instructions at home: Medicines  Take over-the-counter and prescription medicines only as told by your doctor.  If you were prescribed an antibiotic medicine, take it as told by your doctor. Do not stop taking it even if you start to feel better. General instructions   Drink enough fluid to keep your pee (urine) pale yellow.  Do not touch or rub the infected area.  Raise (elevate) the infected area above the level of your heart while you are sitting or lying down.  Place cold or warm cloths on the area as told by your doctor.  Keep all follow-up visits as told by your doctor. This is important. Contact a doctor if:  You have a fever.  You do not start to get better after 1-2 days of treatment.  Your bone or joint under the infected area starts to hurt after the skin has healed.  Your infection comes back. This can happen in the same area or another area.  You have a swollen bump in the area.  You have new symptoms.  You feel ill and have muscle aches and pains. Get help right away if:  Your symptoms get worse.  You feel very sleepy.  You throw up (vomit) or have watery poop (diarrhea) for a long time.  You see red streaks coming from the area.  Your red area gets larger.  Your red area turns dark in color. These symptoms may represent a  serious problem that is an emergency. Do not wait to see if the symptoms will go away. Get medical help right away. Call your local emergency services (911 in the [place] Do not drive yourself to the hospital. Summary  Cellulitis is a skin infection. The area is often warm, red, swollen, and sore.  This condition is treated with medicines, rest, and cold and warm cloths.  Take all medicines only as told by your doctor.  Tell your doctor if symptoms do not start to get better after 1-2 days of treatment. This information is not intended to replace advice given to you by your health care provider. Make sure you discuss any questions you have with your health care provider. Document Released: 04/09/2008 Document Revised: 03/13/2018 Document Reviewed: 03/13/2018 Elsevier Interactive Patient Education  2019 [employer].

## 2019-04-11 LAB — BASIC METABOLIC PANEL WITH GFR
BUN/Creatinine Ratio: 9 (ref 9–20)
BUN: 40 mg/dL — ABNORMAL HIGH (ref 6–24)
CO2: 18 mmol/L — ABNORMAL LOW (ref 20–29)
Calcium: 8.7 mg/dL (ref 8.7–10.2)
Chloride: 108 mmol/L — ABNORMAL HIGH (ref 96–106)
Creatinine, Ser: 4.26 mg/dL — ABNORMAL HIGH (ref 0.76–1.27)
GFR calc Af Amer: 17 mL/min/1.73 — ABNORMAL LOW
GFR calc non Af Amer: 15 mL/min/1.73 — ABNORMAL LOW
Glucose: 103 mg/dL — ABNORMAL HIGH (ref 65–99)
Potassium: 4.2 mmol/L (ref 3.5–5.2)
Sodium: 142 mmol/L (ref 134–144)

## 2019-04-11 LAB — SYPHILIS: RPR W/REFLEX TO RPR TITER AND TREPONEMAL ANTIBODIES, TRADITIONAL SCREENING AND DIAGNOSIS ALGORITHM: RPR Ser Ql: NONREACTIVE

## 2019-04-12 LAB — URINE CULTURE

## 2019-04-16 LAB — URINE CYTOLOGY ANCILLARY ONLY
Chlamydia: NEGATIVE
Neisseria Gonorrhea: NEGATIVE
Trichomonas: NEGATIVE

## 2019-04-20 NOTE — Progress Notes (Signed)
Patient called returned.  Patient did not answer and no voice mail was available.  Will call at a later time.

## 2019-04-21 ENCOUNTER — Telehealth: Payer: Self-pay | Admitting: Family Medicine

## 2019-04-21 NOTE — Telephone Encounter (Signed)
Patient called requesting a refill on his gout medication. Patient states he has a flare up and he uses [facility] pharmacy.

## 2019-04-22 NOTE — Telephone Encounter (Signed)
Patient had refills on medication and they were put into pharmacy here at the clinic.

## 2019-04-23 ENCOUNTER — Encounter: Payer: Self-pay | Admitting: Emergency Medicine

## 2019-04-23 NOTE — Telephone Encounter (Signed)
Patient is unable to be reached.  Letter sent.

## 2019-05-06 ENCOUNTER — Other Ambulatory Visit: Payer: Self-pay

## 2019-05-06 ENCOUNTER — Encounter (HOSPITAL_COMMUNITY): Payer: Self-pay

## 2019-05-06 ENCOUNTER — Emergency Department (HOSPITAL_COMMUNITY)
Admission: EM | Admit: 2019-05-06 | Discharge: 2019-05-06 | Discharge status: Against Medical Advice | Payer: Medicaid Other | Attending: Emergency Medicine | Admitting: Emergency Medicine

## 2019-05-06 DIAGNOSIS — Z760 Encounter for issue of repeat prescription: Secondary | ICD-10-CM | POA: Insufficient documentation

## 2019-05-06 DIAGNOSIS — Z5320 Procedure and treatment not carried out because of patient's decision for unspecified reasons: Secondary | ICD-10-CM | POA: Diagnosis not present

## 2019-05-06 DIAGNOSIS — I10 Essential (primary) hypertension: Secondary | ICD-10-CM | POA: Diagnosis not present

## 2019-05-06 DIAGNOSIS — J029 Acute pharyngitis, unspecified: Secondary | ICD-10-CM | POA: Diagnosis not present

## 2019-05-06 DIAGNOSIS — R300 Dysuria: Secondary | ICD-10-CM | POA: Insufficient documentation

## 2019-05-06 DIAGNOSIS — Z202 Contact with and (suspected) exposure to infections with a predominantly sexual mode of transmission: Secondary | ICD-10-CM | POA: Insufficient documentation

## 2019-05-06 LAB — URINALYSIS, ROUTINE W REFLEX MICROSCOPIC
Bacteria, UA: NONE SEEN
Bilirubin Urine: NEGATIVE
Glucose, UA: NEGATIVE mg/dL
Ketones, ur: NEGATIVE mg/dL
Leukocytes,Ua: NEGATIVE
Nitrite: NEGATIVE
Protein, ur: >=300 mg/dL — AB
Specific Gravity, Urine: 1.024 (ref 1.005–1.030)
pH: 5 (ref 5.0–8.0)

## 2019-05-06 LAB — GROUP A STREP BY PCR: Group A Strep by PCR: NOT DETECTED

## 2019-05-06 MED ORDER — LABETALOL HCL 200 MG PO TABS
200.0000 mg | ORAL_TABLET | Freq: Once | ORAL | Status: AC
  Administered 2019-05-06: 200 mg via ORAL
  Filled 2019-05-06: qty 1

## 2019-05-06 MED ORDER — AMOXICILLIN-POT CLAVULANATE 875-125 MG PO TABS: 1.0000 | ORAL_TABLET | Freq: Two times a day (BID) | ORAL | 0 refills | Status: AC

## 2019-05-06 MED ORDER — AMLODIPINE BESYLATE 5 MG PO TABS
10.0000 mg | ORAL_TABLET | Freq: Once | ORAL | Status: AC
  Administered 2019-05-06: 10 mg via ORAL
  Filled 2019-05-06: qty 2

## 2019-05-06 MED FILL — !COLCRYS 0.6 MG TABLET: 0.6 MG | 10 days supply | Qty: 30 | Fill #1

## 2019-05-06 NOTE — ED Triage Notes (Signed)
Onset last night left ear and left side of throat pain. No respiratory or swallowing difficulties.   Out of Labetolol And Amlodipine.  Requesting dosage prior to discharge.  Pt having penile burning, needs STD check.

## 2019-05-06 NOTE — Discharge Instructions (Addendum)
Please take all of your antibiotics until finished!   You may develop abdominal discomfort or diarrhea from the antibiotic.  You may help offset this with probiotics which you can buy or get in yogurt. Do not eat or take the probiotics until 2 hours after your antibiotic.  Follow-up with the ear nose and throat specialist should throat pain fail to resolve.  Follow-up with a primary care provider for any further management of urinary pain. You have been tested for STDs. Some of these results are still pending. Any abnormalities will be called to you. Be sure to follow safe sex practices, including monogamy and/or condom use.  Any future STD testing or treatment should be performed at the [place] or primary care office. Should symptoms fail to improve within 1 week, follow up with a primary care provider or go to the [facility].

## 2019-05-06 NOTE — ED Provider Notes (Signed)
[facility] EMERGENCY DEPARTMENT Provider Note   CSN: [id number] Arrival date & time: 05/06/19  1556    History   Chief Complaint Chief Complaint  Patient presents with   Medication Refill   Exposure to STD   Otalgia    HPI Johnny [name] is a 58 y.o. male.     HPI   Johnny [name] is a 58 y.o. male, with a history of COPD, HTN, hepatitis C, CKD, presenting to the ED with a couple complaints. He began to have left-sided throat pain radiating to the left ear several days ago.  Pain is aching, moderate.  He has had this issue before.  Denies cough, drooling, difficulty breathing/swallowing, nausea/vomiting.  He also complains of some mild dysuria for the last couple days.  He is requesting STD testing.  He does not want to talk about his sexual history.  He denies penile discharge, fever/chills, abdominal pain, difficulty urinating, testicular/scrotal pain/swelling, pain with bowel movements.  Patient also states he is traveling today and does not have access to his blood pressure medications.  He requests doses of his amlodipine and labetalol here in the ED.  He states he will have access to these medications later today. Denies headache, chest pain, dizziness, neurologic deficits, vision abnormalities, or any other complaints.    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   Chronic diastolic congestive heart failure ([place]) 11/10/2018   Hyperlipidemia 11/10/2018   Anxiety and depression 02/12/2018   Tobacco abuse 12/07/2016   Gout 12/07/2016   COPD (chronic obstructive  pulmonary disease) ([place]) 06/07/2016   CKD (chronic kidney disease) stage 4, GFR 15-29 ml/min ([place]) 06/07/2016   Essential hypertension 06/07/2016   Chronic hepatitis C without hepatic coma ([place]) 03/21/2016   OSA (obstructive sleep apnea) 11/03/2015    Past Surgical History:  Procedure Laterality Date   AV FISTULA PLACEMENT  09/12/2009   Left arm AVF        Home Medications    Prior to Admission medications   Medication Sig Start Date End Date Taking? Authorizing Provider  albuterol (VENTOLIN HFA) 108 (90 Base) MCG/ACT inhaler Inhale 1-2 puffs into the lungs every 6 (six) hours as needed for wheezing or shortness of breath. 03/02/19   Charlott Rakes, MD  allopurinol (ZYLOPRIM) 100 MG tablet Take 2 tablets (200 mg total) by mouth daily. 11/10/18   Elsie Stain, MD  amLODipine (NORVASC) 10 MG tablet Take 1 tablet (10 mg total) by mouth daily. 11/10/18   Elsie Stain, MD  amoxicillin-clavulanate (AUGMENTIN) 875-125 MG tablet Take 1 tablet by mouth every 12 (twelve) hours for 14 days. 05/06/19 05/20/19  Charmain Diosdado C, PA-C  atorvastatin (LIPITOR) 40 MG tablet Take 1 tablet (40 mg total) by mouth daily. 11/10/18   Elsie Stain, MD  colchicine 0.6 MG tablet 2 tabs po x 1, then one tab po 1 hour later prn Gout flare 11/10/18   Elsie Stain, MD  furosemide (LASIX) 20 MG tablet Take 1 tablet (20 mg total) by mouth daily as needed  for edema. 11/10/18   Elsie Stain, MD  labetalol (NORMODYNE) 200 MG tablet Take 1 tablet (200 mg total) by mouth 3 (three) times daily. 04/10/19   Fulp, Cammie, MD  mometasone-formoterol (DULERA) 200-5 MCG/ACT AERO Inhale 2 puffs into the lungs 2 (two) times daily. 11/10/18   Elsie Stain, MD  triamcinolone cream (KENALOG) 0.1 % Apply 1 application topically 2 (two) times daily. 11/10/18   Elsie Stain, MD    Family History Family History  Problem Relation Age of Onset   Hypertension Mother    Diabetes Mother    Colon cancer Neg Hx      Social History Social History   Tobacco Use   Smoking status: Former Smoker    Packs/day: 0.10    Types: Cigarettes    Quit date: 04/02/2011    Years since quitting: 8.0   Smokeless tobacco: Never Used   Tobacco comment: trying to quit  Substance Use Topics   Alcohol use: Never    Alcohol/week: 0.0 standard drinks    Frequency: Never   Drug use: No     Allergies   Naproxen   Review of Systems Review of Systems  Constitutional: Negative for chills, diaphoresis and fever.  HENT: Positive for ear pain and sore throat. Negative for congestion, drooling, trouble swallowing and voice change.   Eyes: Negative for visual disturbance.  Respiratory: Negative for cough and shortness of breath.   Cardiovascular: Negative for chest pain and leg swelling.  Gastrointestinal: Negative for abdominal pain, diarrhea, nausea and vomiting.  Genitourinary: Positive for dysuria. Negative for discharge, flank pain, frequency, hematuria, scrotal swelling and testicular pain.  Musculoskeletal: Negative for back pain and neck pain.  Neurological: Negative for dizziness, syncope, weakness, light-headedness, numbness and headaches.  All other systems reviewed and are negative.    Physical Exam Updated Vital Signs BP (!) 157/103 (BP Location: Right Arm)    Pulse 80    Temp 98.6 F (37 C) (Oral)    Resp 18    Ht 6\' 2"  (1.88 m)    Wt 86.2 kg    SpO2 100%    BMI 24.39 kg/m   Physical Exam Vitals signs and nursing note reviewed.  Constitutional:      General: He is not in acute distress.    Appearance: He is well-developed. He is not diaphoretic.  HENT:     Head: Normocephalic and atraumatic.     Right Ear: Tympanic membrane, ear canal and external ear normal.     Left Ear: Tympanic membrane, ear canal and external ear normal.     Mouth/Throat:     Mouth: Mucous membranes are moist.     Pharynx: Uvula midline. Pharyngeal swelling, oropharyngeal exudate and posterior oropharyngeal  erythema present. No uvula swelling.     Comments: Left-sided tonsillar swelling and erythema with some mild exudate.  No noted fullness or fluctuance to suggest obvious abscess.  No tenderness or fullness to the soft palate.  Dentition appears to be intact and stable. No trismus or noted abnormal phonation.  Mouth opening to at least 3 finger widths.  Handles oral secretions without difficulty.  No noted facial swelling.  No sublingual swelling.  No swelling or tenderness to the submental or submandibular regions.  No swelling or tenderness into the soft tissues of the neck. Eyes:     Conjunctiva/sclera: Conjunctivae normal.  Neck:     Musculoskeletal: Neck supple.  Cardiovascular:     Rate and Rhythm: Normal rate and regular  rhythm.     Pulses: Normal pulses.          Radial pulses are 2+ on the right side and 2+ on the left side.       Posterior tibial pulses are 2+ on the right side and 2+ on the left side.     Heart sounds: Normal heart sounds.     Comments: Tactile temperature in the extremities appropriate and equal bilaterally. Pulmonary:     Effort: Pulmonary effort is normal. No respiratory distress.     Breath sounds: Normal breath sounds.  Abdominal:     Palpations: Abdomen is soft.     Tenderness: There is no abdominal tenderness. There is no guarding.  Genitourinary:    Comments: Declined genital exam Musculoskeletal:     Right lower leg: No edema.     Left lower leg: No edema.  Lymphadenopathy:     Cervical: No cervical adenopathy.  Skin:    General: Skin is warm and dry.  Neurological:     Mental Status: He is alert.  Psychiatric:        Mood and Affect: Mood and affect normal.        Speech: Speech normal.        Behavior: Behavior normal.      ED Treatments / Results  Labs (all labs ordered are listed, but only abnormal results are displayed) Labs Reviewed  URINALYSIS, ROUTINE W REFLEX MICROSCOPIC - Abnormal; Notable for the following components:       Result Value   APPearance HAZY (*)    Hgb urine dipstick MODERATE (*)    Protein, ur >=300 (*)    All other components within normal limits  GROUP A STREP BY PCR  URINE CULTURE  RPR  HIV ANTIBODY (ROUTINE TESTING W REFLEX)  GC/CHLAMYDIA PROBE AMP ([place]) NOT AT [facility]    EKG None  Radiology No results found.  Procedures Procedures (including critical care time)  Medications Ordered in ED Medications  amLODipine (NORVASC) tablet 10 mg (10 mg Oral Given 05/06/19 2010)  labetalol (NORMODYNE) tablet 200 mg (200 mg Oral Given 05/06/19 2010)     Initial Impression / Assessment and Plan / ED Course  I have reviewed the triage vital signs and the nursing notes.  Pertinent labs & imaging results that were available during my care of the patient were reviewed by me and considered in my medical decision making (see chart for details).        Patient presents with sore throat for several days. Patient is nontoxic appearing, afebrile, not tachycardic, not tachypneic, not hypotensive, excellent SPO2 on room air, and is in no apparent distress. Some mild swelling on exam, but no airway compromise. Strep test negative, but due to unilateral nature of the patient's throat pain, I initiated antibiotic therapy.  He was advised to follow-up with ENT.  Also complains of dysuria.  He has some moderate hemoglobin on UA, but no signs of infection.  Patient eloped from the department before I could speak with him about his results.  I did go over the proposed plan prior to placing his orders in the beginning of his visit.     Final Clinical Impressions(s) / ED Diagnoses   Final diagnoses:  Sore throat  Dysuria    ED Discharge Orders         Ordered    amoxicillin-clavulanate (AUGMENTIN) 875-125 MG tablet  Every 12 hours     05/06/19 2046  Lorayne Bender, PA-C 05/06/19 2054    Gareth Morgan, MD 05/08/19 1524

## 2019-05-06 NOTE — ED Notes (Signed)
Patient stated that he has to go pick up his daughter.  Patient reminded that if he leaves, he will have to start over when he returns, but he can get his results from his primary care.

## 2019-05-07 ENCOUNTER — Telehealth: Payer: Self-pay | Admitting: Family Medicine

## 2019-05-07 LAB — URINE CULTURE: Culture: NO GROWTH

## 2019-05-07 LAB — HIV ANTIBODY (ROUTINE TESTING W REFLEX): HIV Screen 4th Generation wRfx: NONREACTIVE

## 2019-05-07 LAB — RPR: RPR Ser Ql: NONREACTIVE

## 2019-05-07 MED FILL — AMOX-CLAV 875-125 MG TABLET: 875-125 | 14 days supply | Qty: 28 | Fill #0

## 2019-05-07 NOTE — Telephone Encounter (Signed)
Pt was in ER and was told he could get in contact with his nurse here at [facility] to get his results..please follow up

## 2019-05-11 NOTE — Telephone Encounter (Signed)
Patient results all look negative. Can you double check before I call patient and inform him of his results. Labs from hospital.

## 2019-05-11 NOTE — Telephone Encounter (Signed)
Urine culture was negative for UTI, HIV, syphilis, and Arrien chlamydia, strep were all negative.

## 2019-05-12 NOTE — Telephone Encounter (Signed)
Patient was called and informed of lab results from hospital.

## 2019-05-20 ENCOUNTER — Telehealth: Payer: Self-pay | Admitting: Family Medicine

## 2019-05-20 DIAGNOSIS — G4733 Obstructive sleep apnea (adult) (pediatric): Secondary | ICD-10-CM

## 2019-05-20 NOTE — Telephone Encounter (Signed)
Pt called to request more information on a C-pap machine that he was supposed to receive, He knows there is a form that he needs sign, please follow up, the phone number on file has been verified

## 2019-05-20 NOTE — Telephone Encounter (Signed)
Call returned to patient regarding his request for CPAP. He said that he was inquiring about the status of the order.  Explained to him that this CM attempted to reach him in 11/2018 about the CPAP association program for CPAP machines but since then, the program has stopped providing machines.  Because  he still has no insurance, we would need to refer him to [place] for their hardship program. They will work with him to determine if he can afford to pay anything for the machine or if they will provide free of charge.  He said that he is not working but has applied for disability and hopes to be approved and then qualify for medicaid.    Explained to him that his sleep study was in 10/2015 and this CM will check with Dr Margarita Rana to determine if the CPAP can be ordered from this sleep study or if he needs another sleep study.

## 2019-05-21 ENCOUNTER — Ambulatory Visit: Payer: Self-pay | Attending: Family Medicine | Admitting: Physician Assistant

## 2019-05-21 ENCOUNTER — Other Ambulatory Visit: Payer: Self-pay

## 2019-05-21 DIAGNOSIS — I10 Essential (primary) hypertension: Secondary | ICD-10-CM

## 2019-05-21 DIAGNOSIS — N184 Chronic kidney disease, stage 4 (severe): Secondary | ICD-10-CM

## 2019-05-21 DIAGNOSIS — R5383 Other fatigue: Secondary | ICD-10-CM

## 2019-05-21 NOTE — Telephone Encounter (Signed)
Yes we can use settings from his last sleep study.

## 2019-05-21 NOTE — Progress Notes (Signed)
Patient verified DOB Patient has not eaten today. Patient has taken medication. Patient denies pain at this time. Patient complains of feeling fatigue with daily chores.

## 2019-05-21 NOTE — Progress Notes (Signed)
Virtual Visit via Telephone Note  I connected with Johnny [name] on 05/21/19 at  9:10 AM EDT by telephone and verified that I am speaking with the correct person using two identifiers.   I discussed the limitations, risks, security and privacy concerns of performing an evaluation and management service by telephone and the availability of in person appointments. I also discussed with the patient that there may be a patient responsible charge related to this service. The patient expressed understanding and agreed to proceed.  Patient location: home My Location:  [facility] office Persons on the call: me and the patient   History of Present Illness: More napping than usual.  Tires out during normal daily chores.  No CP or SOB.  He says this has been going on for years but worse in the last several months during quarantine.     Observations/Objective:  A&Ox3   Assessment and Plan: 1. CKD (chronic kidney disease) stage 4, GFR 15-29 ml/min (HCC) Drink water - Comprehensive metabolic panel; Future  2. Essential hypertension - Comprehensive metabolic panel; Future  3. Fatigue, unspecified type - Thyroid Panel With TSH; Future - CBC with Differential/Platelet; Future - Vitamin D, 25-hydroxy; Future    Follow Up Instructions: Predicated on labs   I discussed the assessment and treatment plan with the patient. The patient was provided an opportunity to ask questions and all were answered. The patient agreed with the plan and demonstrated an understanding of the instructions.   The patient was advised to call back or seek an in-person evaluation if the symptoms worsen or if the condition fails to improve as anticipated.  I provided  13 minutes of non-face-to-face time during this encounter.   Johnny Caldron, PA-C  Patient ID: Johnny [name], male   DOB: May 18, 1961, 58 y.o.   MRN: [id number]

## 2019-05-22 NOTE — Telephone Encounter (Signed)
Please place on Dr Bettina Gavia schedule for a virtual or Telehealth visit as I do not see CPAP settings on his report. Thanks

## 2019-05-25 NOTE — Telephone Encounter (Signed)
Rx for Cpap placed.   Also pls schedule an office visit with me in the near future.

## 2019-05-26 ENCOUNTER — Telehealth: Payer: Self-pay

## 2019-05-26 NOTE — Addendum Note (Signed)
Addended by: Asencion Noble E on: 05/26/2019 10:24 AM   Modules accepted: Orders

## 2019-05-26 NOTE — Telephone Encounter (Signed)
Order for CPAP faxed to [facility]  - attention hardship program.

## 2019-05-26 NOTE — Telephone Encounter (Signed)
The order corrected.  It printed out here at [facility] will bring with me tomorrowe

## 2019-05-26 NOTE — Telephone Encounter (Signed)
Call placed to patient and informed him that a new sleep study is not being ordered at this time.  The order for the CPAP will be sent to [facility] and the will contact him about payment/hardship program.   Informed him that Dr Joya Gaskins would like to see him in the office and an appointment was scheduled for 06/10/2019 @ 1100.

## 2019-05-29 ENCOUNTER — Other Ambulatory Visit: Payer: Self-pay

## 2019-05-29 MED FILL — AMOX-CLAV 875-125 MG TABLET: 875-125 | 14 days supply | Qty: 28 | Fill #0

## 2019-05-29 MED FILL — LABETALOL HCL 200 MG TABLET: 200 | 30 days supply | Qty: 90 | Fill #1

## 2019-06-09 NOTE — Progress Notes (Signed)
Subjective:    Patient ID: Johnny [name], male    DOB: [birth year], 58 y.o.   MRN: [id number]  57 y.o.M here for follow-up of hypertension, stage IV chronic kidney disease with an AV fistula already in place, sleep apnea not treated, chronic obstructive lung disease, chronic diastolic heart failure, homelessness, chronic hepatitis C, eczema of the hand,  Patient was last seen in January.  Patient was housed in a hotel and is now back with friends living in the [place] of town.  He is interested in obtaining more stable housing closer in to the city because of transportation barriers  The patient never picked up the CPAP machine we ordered he did have diagnosed obstructive sleep apnea in 2016.  The patient denies any chest pain shortness of breath or cough.  We had ordered a chest x-ray previously but the patient never followed up on that he also needs follow-up labs at this visit.  Another issue is that the patient needs a podiatrist for significant toenail fungus   Hypertension This is a chronic problem. The current episode started more than 1 month ago. The problem has been gradually improving since onset. The problem is controlled. Associated symptoms include anxiety. Pertinent negatives include no blurred vision, chest pain, headaches, malaise/fatigue, neck pain, orthopnea or palpitations. (Not able to sleep, something in his ear right Notes some pain on Left arm, vein is sore.) Risk factors for coronary artery disease include sedentary lifestyle, male gender and stress (stress issues: trying to accomplish activity. pt was homeless. try to finish school in [place]). Compliance problems include medication cost.  Hypertensive end-organ damage includes kidney disease. There is no history of angina, CVA, heart failure, left ventricular hypertrophy, PVD or retinopathy. Identifiable causes of hypertension include chronic renal disease and sleep apnea. There is no history of coarctation of the aorta,  hyperparathyroidism, a hypertension causing med, pheochromocytoma, renovascular disease or a thyroid problem.    Past Medical History:  Diagnosis Date  . Adenomatous colon polyp    tubular  . Chronic kidney disease   . COPD (chronic obstructive pulmonary disease) ([place])   . Diverticulosis   . External otitis of right ear 11/10/2018  . Hepatitis C   . Hypertension   . OSA (obstructive sleep apnea) 11/03/2015     Family History  Problem Relation Age of Onset  . Hypertension Mother   . Diabetes Mother   . Colon cancer Neg Hx      Social History   Socioeconomic History  . Marital status: Divorced    Spouse name: Not on file  . Number of children: Not on file  . Years of education: 38  . Highest education level: Not on file  Occupational History  . Occupation: Electronics engineer  Social Needs  . Financial resource strain: Not on file  . Food insecurity    Worry: Not on file    Inability: Not on file  . Transportation needs    Medical: Not on file    Non-medical: Not on file  Tobacco Use  . Smoking status: Former Smoker    Packs/day: 0.10    Types: Cigarettes    Quit date: 04/02/2011    Years since quitting: 8.1  . Smokeless tobacco: Never Used  . Tobacco comment: trying to quit  Substance and Sexual Activity  . Alcohol use: Never    Alcohol/week: 0.0 standard drinks    Frequency: Never  . Drug use: No  . Sexual activity: Not Currently  Lifestyle  .  Physical activity    Days per week: Not on file    Minutes per session: Not on file  . Stress: Not on file  Relationships  . Social Herbalist on phone: Not on file    Gets together: Not on file    Attends religious service: Not on file    Active member of club or organization: Not on file    Attends meetings of clubs or organizations: Not on file    Relationship status: Not on file  . Intimate partner violence    Fear of current or ex partner: Not on file    Emotionally abused: Not on file    Physically  abused: Not on file    Forced sexual activity: Not on file  Other Topics Concern  . Not on file  Social History Narrative   Lives at home alone.   Right-handed.    Occasional caffeine use.     Allergies  Allergen Reactions  . Naproxen Other (See Comments)    Unknown     Outpatient Medications Prior to Visit  Medication Sig Dispense Refill  . colchicine 0.6 MG tablet 2 tabs po x 1, then one tab po 1 hour later prn Gout flare 30 tablet 3  . labetalol (NORMODYNE) 200 MG tablet Take 1 tablet (200 mg total) by mouth 3 (three) times daily. 90 tablet 3  . albuterol (VENTOLIN HFA) 108 (90 Base) MCG/ACT inhaler Inhale 1-2 puffs into the lungs every 6 (six) hours as needed for wheezing or shortness of breath. 1 Inhaler 0  . allopurinol (ZYLOPRIM) 100 MG tablet Take 2 tablets (200 mg total) by mouth daily. 60 tablet 5  . amLODipine (NORVASC) 10 MG tablet Take 1 tablet (10 mg total) by mouth daily. 30 tablet 5  . atorvastatin (LIPITOR) 40 MG tablet Take 1 tablet (40 mg total) by mouth daily. 30 tablet 3  . furosemide (LASIX) 20 MG tablet Take 1 tablet (20 mg total) by mouth daily as needed for edema. 30 tablet 3  . mometasone-formoterol (DULERA) 200-5 MCG/ACT AERO Inhale 2 puffs into the lungs 2 (two) times daily. 13 g 5  . triamcinolone cream (KENALOG) 0.1 % Apply 1 application topically 2 (two) times daily. 45 g 1   No facility-administered medications prior to visit.      Review of Systems  Constitutional: Negative for malaise/fatigue.  Eyes: Negative for blurred vision.  Cardiovascular: Negative for chest pain, palpitations and orthopnea.  Musculoskeletal: Negative for neck pain.  Neurological: Negative for headaches.       Objective:   Physical Exam  Vitals:   06/10/19 1052  BP: (!) 152/94  Pulse: 70  Resp: 16  Temp: 98.2 F (36.8 C)  TempSrc: Oral  SpO2: 99%  Weight: 200 lb 12.8 oz (91.1 kg)    Gen: Pleasant, well-nourished, in no distress,  normal affect  ENT: The  right ear still has evidence for cerumen impaction but no evidence of infection  the left ear is normal,  mouth clear,  oropharynx clear, no postnasal drip  Neck: No JVD, no TMG, no carotid bruits  Lungs: No use of accessory muscles, no dullness to percussion, Distant breath sounds without wheezing  Cardiovascular: RRR, heart sounds normal, no murmur or gallops, no peripheral edema, there is a left upper extremity AV fistula with a palpable thrill.    Abdomen: soft and NT, no HSM,  BS normal  Musculoskeletal: No deformities, no cyanosis or clubbing  Neuro: alert, non  focal  Skin: Warm, there is evidence for dermatitis with dry skin in the palm of the left hand between the thumb and index finger  BMP Latest Ref Rng & Units 04/10/2019 11/10/2018 02/12/2018  Glucose 65 - 99 mg/dL 103(H) 92 90  BUN 6 - 24 mg/dL 40(H) 29(H) 27(H)  Creatinine 0.76 - 1.27 mg/dL 4.26(H) 3.37(H) 2.91(H)  BUN/Creat Ratio 9 - 20 9 9 9   Sodium 134 - 144 mmol/L 142 140 141  Potassium 3.5 - 5.2 mmol/L 4.2 4.3 4.3  Chloride 96 - 106 mmol/L 108(H) 102 102  CO2 20 - 29 mmol/L 18(L) 24 22  Calcium 8.7 - 10.2 mg/dL 8.7 9.4 9.4   CBC Latest Ref Rng & Units 11/10/2018 06/17/2017 09/03/2016  WBC 3.4 - 10.8 x10E3/uL 8.8 11.7(H) 6.1  Hemoglobin 13.0 - 17.7 g/dL 14.4 14.3 14.9  Hematocrit 37.5 - 51.0 % 41.0 42.2 42.0  Platelets 150 - 450 x10E3/uL 227 252 107(L)   Lipid Panel     Component Value Date/Time   CHOL 214 (H) 11/10/2018 1104   TRIG 80 11/10/2018 1104   HDL 96 11/10/2018 1104   CHOLHDL 2.2 11/10/2018 1104   CHOLHDL 2.3 12/07/2016 1006   VLDL 16 02/22/2009 0635   LDLCALC 102 (H) 11/10/2018 1104       Assessment & Plan:  I personally reviewed all images and lab data in the [facility] system as well as any outside material available during this office visit and agree with the  radiology impressions.   Chronic diastolic congestive heart failure (HCC) Chronic diastolic heart failure stable at this time  We will  continue beta-blocker and diuretic along with amlodipine  Essential hypertension Hypertension under fair control we will continue current medication profile  COPD (chronic obstructive pulmonary disease) (HCC) Chronic obstructive lung disease will obtain for the patient now Dulera 100 strength 2 inhalations twice daily and albuterol as needed  The patient is no longer smoking at this time  OSA (obstructive sleep apnea) Have engage the nurse case manager to obtain for patient the sleep apnea machine he is needing  External otitis of right ear External otitis is resolved in the right ear however will have the cerumen cleaned today by nursing  Eczema of hand Will refill the triamcinolone cream  Anxiety and depression I have asked our licensed clinical social worker to engage with the patient over his severe anxiety and depression  Hyperlipidemia We will continue atorvastatin  Homelessness I engaged the subclinical social worker and case manager to help the patient with housing  History of tobacco use disorder The patient is no longer smoking at this time   Olly was seen today for hypertension and gout.  Diagnoses and all orders for this visit:  Essential hypertension -     amLODipine (NORVASC) 10 MG tablet; Take 1 tablet (10 mg total) by mouth daily. -     Comprehensive metabolic panel -     CBC with Differential/Platelet; Future -     Thyroid Profile -     TSH -     CBC with Differential/Platelet  Idiopathic chronic gout without tophus, unspecified site -     allopurinol (ZYLOPRIM) 100 MG tablet; Take 2 tablets (200 mg total) by mouth daily.  CKD (chronic kidney disease) stage 4, GFR 15-29 ml/min (HCC) -     furosemide (LASIX) 20 MG tablet; Take 1 tablet (20 mg total) by mouth daily as needed for edema. -     Comprehensive metabolic panel -  Vitamin D 1,25 dihydroxy -     Ambulatory referral to Nephrology  Other emphysema ([place]) -     Discontinue:  mometasone-formoterol (DULERA) 200-5 MCG/ACT AERO; Inhale 2 puffs into the lungs 2 (two) times daily. -     DG Chest 2 View; Future  Other atopic dermatitis -     triamcinolone cream (KENALOG) 0.1 %; Apply 1 application topically 2 (two) times daily.  Colon cancer screening -     Fecal occult blood, imunochemical  Chronic diastolic congestive heart failure (HCC)  OSA (obstructive sleep apnea)  Pure hypercholesterolemia  Anxiety and depression  Homelessness  Toenail fungus -     Ambulatory referral to Podiatry  Other infective acute otitis externa of right ear  Eczema of hand  Tobacco abuse  Other orders -     albuterol (VENTOLIN HFA) 108 (90 Base) MCG/ACT inhaler; Inhale 1-2 puffs into the lungs every 6 (six) hours as needed for wheezing or shortness of breath. -     atorvastatin (LIPITOR) 40 MG tablet; Take 1 tablet (40 mg total) by mouth daily. -     Tdap vaccine greater than or equal to 7yo IM -     mometasone-formoterol (DULERA) 100-5 MCG/ACT AERO; Inhale 2 puffs into the lungs 2 (two) times daily.    Note we did administer a tetanus vaccine at this visit

## 2019-06-10 ENCOUNTER — Ambulatory Visit: Payer: Self-pay | Attending: Critical Care Medicine | Admitting: Critical Care Medicine

## 2019-06-10 ENCOUNTER — Ambulatory Visit (HOSPITAL_BASED_OUTPATIENT_CLINIC_OR_DEPARTMENT_OTHER): Payer: Self-pay | Admitting: Licensed Clinical Social Worker

## 2019-06-10 ENCOUNTER — Other Ambulatory Visit: Payer: Self-pay

## 2019-06-10 ENCOUNTER — Encounter: Payer: Self-pay | Admitting: Critical Care Medicine

## 2019-06-10 ENCOUNTER — Telehealth: Payer: Self-pay

## 2019-06-10 VITALS — BP 152/94 | HR 70 | Temp 98.2°F | Resp 16 | Wt 200.8 lb

## 2019-06-10 DIAGNOSIS — F329 Major depressive disorder, single episode, unspecified: Secondary | ICD-10-CM

## 2019-06-10 DIAGNOSIS — Z5982 Transportation insecurity: Secondary | ICD-10-CM

## 2019-06-10 DIAGNOSIS — H60391 Other infective otitis externa, right ear: Secondary | ICD-10-CM

## 2019-06-10 DIAGNOSIS — I5032 Chronic diastolic (congestive) heart failure: Secondary | ICD-10-CM

## 2019-06-10 DIAGNOSIS — J438 Other emphysema: Secondary | ICD-10-CM

## 2019-06-10 DIAGNOSIS — Z5900 Homelessness unspecified: Secondary | ICD-10-CM

## 2019-06-10 DIAGNOSIS — Z23 Encounter for immunization: Secondary | ICD-10-CM

## 2019-06-10 DIAGNOSIS — Z720 Tobacco use: Secondary | ICD-10-CM

## 2019-06-10 DIAGNOSIS — L309 Dermatitis, unspecified: Secondary | ICD-10-CM

## 2019-06-10 DIAGNOSIS — Z1211 Encounter for screening for malignant neoplasm of colon: Secondary | ICD-10-CM

## 2019-06-10 DIAGNOSIS — B351 Tinea unguium: Secondary | ICD-10-CM

## 2019-06-10 DIAGNOSIS — N184 Chronic kidney disease, stage 4 (severe): Secondary | ICD-10-CM

## 2019-06-10 DIAGNOSIS — Z598 Other problems related to housing and economic circumstances: Secondary | ICD-10-CM

## 2019-06-10 DIAGNOSIS — Z599 Problem related to housing and economic circumstances, unspecified: Secondary | ICD-10-CM

## 2019-06-10 DIAGNOSIS — L2089 Other atopic dermatitis: Secondary | ICD-10-CM

## 2019-06-10 DIAGNOSIS — F419 Anxiety disorder, unspecified: Secondary | ICD-10-CM

## 2019-06-10 DIAGNOSIS — Z9189 Other specified personal risk factors, not elsewhere classified: Secondary | ICD-10-CM

## 2019-06-10 DIAGNOSIS — I10 Essential (primary) hypertension: Secondary | ICD-10-CM

## 2019-06-10 DIAGNOSIS — F331 Major depressive disorder, recurrent, moderate: Secondary | ICD-10-CM

## 2019-06-10 DIAGNOSIS — G4733 Obstructive sleep apnea (adult) (pediatric): Secondary | ICD-10-CM

## 2019-06-10 DIAGNOSIS — M1A00X0 Idiopathic chronic gout, unspecified site, without tophus (tophi): Secondary | ICD-10-CM

## 2019-06-10 DIAGNOSIS — E7800 Pure hypercholesterolemia, unspecified: Secondary | ICD-10-CM

## 2019-06-10 MED ORDER — DULERA 100-5 MCG/ACT IN AERO: 2.0000 | INHALATION_SPRAY | Freq: Two times a day (BID) | RESPIRATORY_TRACT | 2 refills | Status: DC

## 2019-06-10 MED ORDER — ALBUTEROL SULFATE HFA 108 (90 BASE) MCG/ACT IN AERS: 1.0000 | INHALATION_SPRAY | Freq: Four times a day (QID) | RESPIRATORY_TRACT | 1 refills | Status: DC | PRN

## 2019-06-10 MED ORDER — DULERA 200-5 MCG/ACT IN AERO: 2.0000 | INHALATION_SPRAY | Freq: Two times a day (BID) | RESPIRATORY_TRACT | 5 refills | Status: DC

## 2019-06-10 MED ORDER — ALLOPURINOL 100 MG PO TABS: 200.0000 mg | ORAL_TABLET | Freq: Every day | ORAL | 5 refills | Status: DC

## 2019-06-10 MED ORDER — ATORVASTATIN CALCIUM 40 MG PO TABS: 40.0000 mg | ORAL_TABLET | Freq: Every day | ORAL | 3 refills | Status: DC

## 2019-06-10 MED ORDER — FUROSEMIDE 20 MG PO TABS: 20.0000 mg | ORAL_TABLET | Freq: Every day | ORAL | 3 refills | Status: DC | PRN

## 2019-06-10 MED ORDER — AMLODIPINE BESYLATE 10 MG PO TABS: 10.0000 mg | ORAL_TABLET | Freq: Every day | ORAL | 5 refills | Status: DC

## 2019-06-10 MED ORDER — TRIAMCINOLONE ACETONIDE 0.1 % EX CREA: 1.0000 "application " | TOPICAL_CREAM | Freq: Two times a day (BID) | CUTANEOUS | 1 refills | Status: DC

## 2019-06-10 MED FILL — !DULERA 100 MCG/5 MCG INH: 100-5 | 30 days supply | Qty: 13 | Fill #0

## 2019-06-10 MED FILL — ?ATORVASTATIN 40MG TABLET: 40 | 30 days supply | Qty: 30 | Fill #0

## 2019-06-10 MED FILL — LABETALOL HCL 200 MG TABLET: 200 | 30 days supply | Qty: 90 | Fill #1

## 2019-06-10 MED FILL — FUROSEMIDE 20 MG TABS: 20 | 30 days supply | Qty: 30 | Fill #0

## 2019-06-10 MED FILL — TRIAMCINOLONE ACETONIDE 0.1: 0.1 | 15 days supply | Qty: 45 | Fill #0

## 2019-06-10 MED FILL — !VENTOLIN HFA INHALER: 108 (90 BAS | 24 days supply | Qty: 18 | Fill #0

## 2019-06-10 MED FILL — ?ALLOPURINOL 100MG TABLET: 100 | 30 days supply | Qty: 60 | Fill #0

## 2019-06-10 MED FILL — ?AMLODIPINE BESYLATE 10 MG: 10 | 30 days supply | Qty: 30 | Fill #0

## 2019-06-10 NOTE — Patient Instructions (Addendum)
We will look into getting you a CPAP machine  Refills on all of your medications were sent to our pharmacy  A tetanus vaccine will be given  A stool card will be given to screening for colon cancer  A referral to podiatry will be given for your toenail fungus  Please complete the financial assistance paperwork and achieve a meeting  A referral to nephrology will be made for follow-up of your kidney dysfunction  Return to see Dr. Joya Gaskins in 6 weeks  Td Vaccine (Tetanus and Diphtheria): What You Need to Know 1. Why get vaccinated? Tetanus  and diphtheria are very serious diseases. They are rare in the [place] today, but people who do become infected often have severe complications. Td vaccine is used to protect adolescents and adults from both of these diseases. Both tetanus and diphtheria are infections caused by bacteria. Diphtheria spreads from person to person through coughing or sneezing. Tetanus-causing bacteria enter the body through cuts, scratches, or wounds. TETANUS (Lockjaw) causes painful muscle tightening and stiffness, usually all over the body.  It can lead to tightening of muscles in the head and neck so you can't open your mouth, swallow, or sometimes even breathe. Tetanus kills about 1 out of every 10 people who are infected even after receiving the best medical care. DIPHTHERIA can cause a thick coating to form in the back of the throat.  It can lead to breathing problems, paralysis, heart failure, and death. Before vaccines, as many as 200,000 cases of diphtheria and hundreds of cases of tetanus were reported in the [place] each year. Since vaccination began, reports of cases for both diseases have dropped by about 99%. 2. Td vaccine Td vaccine can protect adolescents and adults from tetanus and diphtheria. Td is usually given as a booster dose every 10 years but it can also be given earlier after a severe and dirty wound or burn. Another vaccine, called  Tdap, which protects against pertussis in addition to tetanus and diphtheria, is sometimes recommended instead of Td vaccine. Your doctor or the person giving you the vaccine can give you more information. Td may safely be given at the same time as other vaccines. 3. Some people should not get this vaccine  A person who has ever had a life-threatening allergic reaction after a previous dose of any tetanus or diphtheria containing vaccine, OR has a severe allergy to any part of this vaccine, should not get Td vaccine. Tell the person giving the vaccine about any severe allergies.  Talk to your doctor if you: ? had severe pain or swelling after any vaccine containing diphtheria or tetanus, ? ever had a condition called Guillain Barr Syndrome (GBS), ? aren't feeling well on the day the shot is scheduled. 4. Risks of a vaccine reaction With any medicine, including vaccines, there is a chance of side effects. These are usually mild and go away on their own. Serious reactions are also possible but are rare. Most people who get Td vaccine do not have any problems with it. Mild Problems following Td vaccine: (Did not interfere with activities)  Pain where the shot was given (about 8 people in 10)  Redness or swelling where the shot was given (about 1 person in 4)  Mild fever (rare)  Headache (about 1 person in 4)  Tiredness (about 1 person in 4) Moderate Problems following Td vaccine: (Interfered with activities, but did not require medical attention)  Fever over 102F (rare) Severe Problems following Td  vaccine: (Unable to perform usual activities; required medical attention)  Swelling, severe pain, bleeding and/or redness in the arm where the shot was given (rare). Problems that could happen after any vaccine:  People sometimes faint after a medical procedure, including vaccination. Sitting or lying down for about 15 minutes can help prevent fainting, and injuries caused by a fall.  Tell your doctor if you feel dizzy, or have vision changes or ringing in the ears.  Some people get severe pain in the shoulder and have difficulty moving the arm where a shot was given. This happens very rarely.  Any medication can cause a severe allergic reaction. Such reactions from a vaccine are very rare, estimated at fewer than 1 in a million doses, and would happen within a few minutes to a few hours after the vaccination. As with any medicine, there is a very remote chance of a vaccine causing a serious injury or death. The safety of vaccines is always being monitored. For more information, visit: http://www.aguilar.org/ 5. What if there is a serious reaction? What should I look for?  Look for anything that concerns you, such as signs of a severe allergic reaction, very high fever, or unusual behavior. Signs of a severe allergic reaction can include hives, swelling of the face and throat, difficulty breathing, a fast heartbeat, dizziness, and weakness. These would usually start a few minutes to a few hours after the vaccination. What should I do?  If you think it is a severe allergic reaction or other emergency that can't wait, call 9-1-1 or get the person to the nearest hospital. Otherwise, call your doctor.  Afterward, the reaction should be reported to the Vaccine Adverse Event Reporting System (VAERS). Your doctor might file this report, or you can do it yourself through the VAERS web site at www.vaers.SamedayNews.es, or by calling (802)258-8511. VAERS does not give medical advice. 6. The National Vaccine Injury Compensation Program The [employer] Vaccine Injury Compensation Program (VICP) is a federal program that was created to compensate people who may have been injured by certain vaccines. Persons who believe they may have been injured by a vaccine can learn about the program and about filing a claim by calling 240-502-3740 or visiting the [place] website at  GoldCloset.com.ee. There is a time limit to file a claim for compensation. 7. How can I learn more?  Ask your doctor. He or she can give you the vaccine package insert or suggest other sources of information.  Call your local or state health department.  Contact the Centers for Disease Control and Prevention (CDC): ? Call 6574945474 (1-800-CDC-INFO) ? Visit CDC's website at http://[place].com/ Vaccine Information Statement Td Vaccine (02/14/16) This information is not intended to replace advice given to you by your health care provider. Make sure you discuss any questions you have with your health care provider. Document Released: 08/19/2006 Document Revised: 06/09/2018 Document Reviewed: 06/09/2018 Elsevier Interactive Patient Education  [employer].

## 2019-06-10 NOTE — Telephone Encounter (Signed)
Met with the patient when he was in the clinic today.  He stated that he has not yet received his CPAP machine,   Call placed to [facility], spoke to [place] who stated that they mailed the patient a hardship application and have not heard back from him. She noted that it was sent to his address on [address].  Informed patient that the hardship application was mailed to him.  He then confirmed that he has a different address: [address], [place], [place] [address]. He also noted that he has access to computer/email. Provided him with the contact number for [facility]. He also noted that he is not currently living in [facility] and is not able to use [facility] non medicaid transportation or [place].  Provided him with the phone # fro [place] transportation.

## 2019-06-11 ENCOUNTER — Other Ambulatory Visit: Payer: Self-pay | Admitting: Critical Care Medicine

## 2019-06-11 ENCOUNTER — Encounter: Payer: Self-pay | Admitting: Critical Care Medicine

## 2019-06-11 DIAGNOSIS — Z5900 Homelessness unspecified: Secondary | ICD-10-CM | POA: Insufficient documentation

## 2019-06-11 DIAGNOSIS — E039 Hypothyroidism, unspecified: Secondary | ICD-10-CM

## 2019-06-11 DIAGNOSIS — L2089 Other atopic dermatitis: Secondary | ICD-10-CM | POA: Insufficient documentation

## 2019-06-11 DIAGNOSIS — B351 Tinea unguium: Secondary | ICD-10-CM | POA: Insufficient documentation

## 2019-06-11 MED ORDER — LEVOTHYROXINE SODIUM 50 MCG PO TABS: 50.0000 ug | ORAL_TABLET | Freq: Every day | ORAL | 3 refills | Status: DC

## 2019-06-11 MED FILL — LEVOTHYROXINE 50 MCG TABLET: 50 | 30 days supply | Qty: 30 | Fill #0

## 2019-06-11 NOTE — Assessment & Plan Note (Signed)
Have engage the nurse case manager to obtain for patient the sleep apnea machine he is needing

## 2019-06-11 NOTE — Assessment & Plan Note (Signed)
We will continue atorvastatin

## 2019-06-11 NOTE — Assessment & Plan Note (Signed)
I have asked our licensed clinical social worker to engage with the patient over his severe anxiety and depression

## 2019-06-11 NOTE — Assessment & Plan Note (Signed)
External otitis is resolved in the right ear however will have the cerumen cleaned today by nursing

## 2019-06-11 NOTE — Progress Notes (Signed)
Rx for newly Dx hypothyroidism

## 2019-06-11 NOTE — Assessment & Plan Note (Signed)
The patient is no longer smoking at this time

## 2019-06-11 NOTE — Assessment & Plan Note (Signed)
Will refill the triamcinolone cream

## 2019-06-11 NOTE — Assessment & Plan Note (Signed)
Chronic obstructive lung disease will obtain for the patient now Dulera 100 strength 2 inhalations twice daily and albuterol as needed  The patient is no longer smoking at this time

## 2019-06-11 NOTE — Assessment & Plan Note (Signed)
Chronic diastolic heart failure stable at this time  We will continue beta-blocker and diuretic along with amlodipine

## 2019-06-11 NOTE — Assessment & Plan Note (Signed)
Hypertension under fair control we will continue current medication profile

## 2019-06-11 NOTE — Assessment & Plan Note (Signed)
I engaged the subclinical Education officer, museum and case manager to help the patient with housing

## 2019-06-12 ENCOUNTER — Other Ambulatory Visit: Payer: Self-pay

## 2019-06-12 NOTE — BH Specialist Note (Signed)
Integrated Behavioral Health Initial Visit  MRN: [id number] Name: Johnny [name]  Number of [place] Clinician visits:: 1/6 Session Start time: 11:25 AM  Session End time: 11:45 AM Total time: 20 minutes  Type of Service: [place] Interpretor:No. Interpretor Name and Language: NA   Warm Hand Off Completed.       SUBJECTIVE: Johnny [name] is a 58 y.o. male accompanied by self Patient was referred by Dr. Joya Gaskins for depression and anxiety. Patient reports the following symptoms/concerns: Pt has difficulty managing physical and mental health triggered by psychosocial stressors. He has chronic medical conditions and is currently homeless. Pt is residing in [facility] noting transportation is a barrier to making appointments  Duration of problem: Ongoing; Severity of problem: severe  OBJECTIVE: Mood: Depressed and Affect: Appropriate Risk of harm to self or others: No plan to harm self or others  LIFE CONTEXT: Family and Social: Pt is currently staying with friends temporarily  School/Work: Pt has applied for [employer] and has a pending claim for social security Self-Care: Pt prays and practices gratitude thinking to cope with stressors Life Changes: Pt has difficulty managing physical and mental health due to psychosocial stressors  GOALS ADDRESSED: Patient will: 1. Reduce symptoms of: agitation, anxiety, depression and stress 2. Increase knowledge and/or ability of: self-management skills  3. Demonstrate ability to: Increase healthy adjustment to current life circumstances and Increase adequate support systems for patient/family  INTERVENTIONS: Interventions utilized: Mindfulness or Psychologist, educational, Supportive Counseling and Link to [employer]  Standardized Assessments completed: GAD-7 and PHQ 2&9  ASSESSMENT: Patient currently experiencing depression and anxiety triggered by psychosocial stressors. He has  chronic medical conditions and is currently homeless. Pt is residing in [facility] noting transportation is a barrier to making appointments. He is concerned about inability to pay for phone and having no way to contact providers, DSS, etc.   Patient may benefit from linkage to community resources. LCSW provided support and encouragement. Pt was informed of Legal Aid, in the case he is denied for both medicaid or SSI. RN CM provided information on [employer] to assist with transportation to [employer]. Pt was encouraged to contact [facility] for additional support regarding homelessness.   PLAN: 1. Follow up with behavioral health clinician on : Schedule follow up appointment 2. Behavioral recommendations: LCSW recommends pt follow up with resources provided 3. Referral(s): Integrated Orthoptist (In Clinic) and [employer]:  Housing and Transportation 4. "From scale of 1-10, how likely are you to follow plan?": 8  [name], LCSW 06/12/2019 11:30 AM

## 2019-06-15 ENCOUNTER — Institutional Professional Consult (permissible substitution): Payer: Self-pay | Admitting: Licensed Clinical Social Worker

## 2019-06-15 ENCOUNTER — Telehealth: Payer: Self-pay

## 2019-06-15 LAB — COMPREHENSIVE METABOLIC PANEL WITH GFR
ALT: 42 IU/L (ref 0–44)
AST: 48 IU/L — ABNORMAL HIGH (ref 0–40)
Albumin/Globulin Ratio: 1.1 — ABNORMAL LOW (ref 1.2–2.2)
Albumin: 4 g/dL (ref 3.8–4.9)
Alkaline Phosphatase: 70 IU/L (ref 39–117)
BUN/Creatinine Ratio: 13 (ref 9–20)
BUN: 45 mg/dL — ABNORMAL HIGH (ref 6–24)
Bilirubin Total: 0.5 mg/dL (ref 0.0–1.2)
CO2: 24 mmol/L (ref 20–29)
Calcium: 9.4 mg/dL (ref 8.7–10.2)
Chloride: 101 mmol/L (ref 96–106)
Creatinine, Ser: 3.41 mg/dL — ABNORMAL HIGH (ref 0.76–1.27)
GFR calc Af Amer: 22 mL/min/1.73 — ABNORMAL LOW (ref >59)
GFR calc non Af Amer: 19 mL/min/1.73 — ABNORMAL LOW (ref >59)
Globulin, Total: 3.6 g/dL (ref 1.5–4.5)
Glucose: 79 mg/dL (ref 65–99)
Potassium: 4 mmol/L (ref 3.5–5.2)
Sodium: 140 mmol/L (ref 134–144)
Total Protein: 7.6 g/dL (ref 6.0–8.5)

## 2019-06-15 LAB — THYROID PANEL
Free Thyroxine Index: 1.7 (ref 1.2–4.9)
T3 Uptake Ratio: 22 % — ABNORMAL LOW (ref 24–39)
T4, Total: 7.9 ug/dL (ref 4.5–12.0)

## 2019-06-15 LAB — CBC WITH DIFFERENTIAL/PLATELET
Basophils Absolute: 0.1 x10E3/uL (ref 0.0–0.2)
Basos: 1 %
EOS (ABSOLUTE): 0.5 x10E3/uL — ABNORMAL HIGH (ref 0.0–0.4)
Eos: 7 %
Hematocrit: 41.1 % (ref 37.5–51.0)
Hemoglobin: 13.8 g/dL (ref 13.0–17.7)
Immature Grans (Abs): 0 x10E3/uL (ref 0.0–0.1)
Immature Granulocytes: 0 %
Lymphocytes Absolute: 3.2 x10E3/uL — ABNORMAL HIGH (ref 0.7–3.1)
Lymphs: 41 %
MCH: 26.3 pg — ABNORMAL LOW (ref 26.6–33.0)
MCHC: 33.6 g/dL (ref 31.5–35.7)
MCV: 78 fL — ABNORMAL LOW (ref 79–97)
Monocytes Absolute: 1.1 x10E3/uL — ABNORMAL HIGH (ref 0.1–0.9)
Monocytes: 14 %
Neutrophils Absolute: 3 x10E3/uL (ref 1.4–7.0)
Neutrophils: 37 %
Platelets: 205 x10E3/uL (ref 150–450)
RBC: 5.24 x10E6/uL (ref 4.14–5.80)
RDW: 16.5 % — ABNORMAL HIGH (ref 11.6–15.4)
WBC: 7.9 x10E3/uL (ref 3.4–10.8)

## 2019-06-15 LAB — TSH: TSH: 5.28 u[IU]/mL — ABNORMAL HIGH (ref 0.450–4.500)

## 2019-06-15 LAB — VITAMIN D 1,25 DIHYDROXY
Vitamin D 1, 25 (OH)2 Total: 56 pg/mL
Vitamin D2 1, 25 (OH)2: <10 pg/mL
Vitamin D3 1, 25 (OH)2: 56 pg/mL

## 2019-06-15 NOTE — Telephone Encounter (Signed)
Call placed to patient to discuss his clinic appointment scheduled for tomorrow. He just saw Dr Joya Gaskins last week and it to follow up with him in 6 weeks. The patient said that he does not need to see/speak to a provider tomorrow and appointment can be cancelled. He did schedule an appointment to speak to Christa See, LCSW this afternoon regarding his depression.  He also said that he needs to contact [facility] about the CPAP hardship application.

## 2019-06-16 ENCOUNTER — Telehealth: Payer: Self-pay

## 2019-06-16 ENCOUNTER — Telehealth: Payer: Self-pay | Admitting: Family Medicine

## 2019-06-16 ENCOUNTER — Ambulatory Visit: Payer: Self-pay | Admitting: Family Medicine

## 2019-06-16 NOTE — Telephone Encounter (Signed)
Patient states he couldn't go to the foot dr because he doesn't have medicaid but was told by the foot provider that they could provide a 100% coverage letter and that he can have that appointment setup. Please follow up.

## 2019-06-16 NOTE — Telephone Encounter (Signed)
Call returned to patient  [phone] with CM call back # [phone].  Patient will need to apply for [facility] Card in order to obtain coverage for podiatry referral.  He will need to have [facility] address and may need to establish mailbox at [facility].

## 2019-06-17 ENCOUNTER — Ambulatory Visit: Payer: Self-pay | Attending: Family Medicine | Admitting: Licensed Clinical Social Worker

## 2019-06-17 ENCOUNTER — Other Ambulatory Visit: Payer: Self-pay

## 2019-06-17 DIAGNOSIS — F332 Major depressive disorder, recurrent severe without psychotic features: Secondary | ICD-10-CM

## 2019-06-17 DIAGNOSIS — F419 Anxiety disorder, unspecified: Secondary | ICD-10-CM

## 2019-06-17 NOTE — Telephone Encounter (Signed)
Call placed to [facility] to check on status of CPAP referral.  Spoke to [place] who confirmed his current address and stated that they have left patient messages and are waiting to hear back from him.

## 2019-06-19 NOTE — BH Specialist Note (Signed)
Integrated Behavioral Health Visit via Telemedicine (Telephone)  06/19/2019 Johnny [name] [id number]   Session Start time: 10:00 AM  Session End time: 10:30 AM Total time: 30 minutes  Referring Provider: Dr. Joya Gaskins Type of Visit: Telephonic Patient location: Home [facility] Provider location: Office All persons participating in visit: LCSW and patient  Confirmed patient's address: Yes  Confirmed patient's phone number: Yes  Any changes to demographics: No   Confirmed patient's insurance: Yes  Any changes to patient's insurance: No   Discussed confidentiality: Yes    The following statements were read to the patient and/or legal guardian that are established with the [facility] Provider.  "The purpose of this phone visit is to provide behavioral health care while limiting exposure to the coronavirus (COVID19).  There is a possibility of technology failure and discussed alternative modes of communication if that failure occurs."  "By engaging in this telephone visit, you consent to the provision of healthcare.  Additionally, you authorize for your insurance to be billed for the services provided during this telephone visit."   Patient and/or legal guardian consented to telephone visit: Yes   PRESENTING CONCERNS: Patient and/or family reports the following symptoms/concerns: Pt reports difficulty managing depression and anxiety. Symptoms include nightmares, difficulty sleeping (1.5 hours daily), low energy, irritability, and feelings of lonliness Duration of problem: Ongoing; Severity of problem: severe  STRENGTHS (Protective Factors/Coping Skills): Pt has a safe place to stay for the time being Pt has the ability to follow up on resources provided a previous appointment ([facility])  GOALS ADDRESSED: Patient will: 1.  Reduce symptoms of: anxiety and depression  2.  Increase knowledge and/or ability of: self-management skills  3.  Demonstrate ability to:  Increase healthy adjustment to current life circumstances  INTERVENTIONS: Interventions utilized:  Behavioral Activation and Supportive Counseling Standardized Assessments completed: Not Needed  ASSESSMENT: Patient currently experiencing depression and anxiety triggered by current homelessness and limited support. Symptoms include nightmares, difficulty sleeping (1.5 hours daily), low energy, irritability, and feelings of loneliness. Pt denies SI/HI.   Patient may benefit from medication management. Pt shared that he has been having difficulty focusing on following up with resources due to diagnosis of ADD. He is interested in a prescription for Adderall. LCSW provided pt with information on [facility] [place] and encouraged him to initiate psychiatry and psychotherapy to assist him with medication management. LCSW also strongly encouraged pt to create a schedule for his days to promote feelings of productivity and decrease reported symptoms.   PLAN: 1. Follow up with behavioral health clinician on : Schedule follow up appointment with LCSW 2. Behavioral recommendations: LCSW recommends pt utilize strategies discussed in session and initiate psychotherapy and psychiatry in the community to assist with ADD 3. Referral(s): [facility] (In Clinic) and [facility] (LME/Outside Clinic)  [name], [place] 06/29/2019 6:48 AM

## 2019-06-22 ENCOUNTER — Telehealth: Payer: Self-pay

## 2019-06-22 MED FILL — ?AMLODIPINE BESYLATE 10 MG: 10 | 90 days supply | Qty: 90 | Fill #1

## 2019-06-22 MED FILL — ?LEVOTHYROXINE 50 MCG TABLE: 50 | 30 days supply | Qty: 30 | Fill #1

## 2019-06-22 NOTE — Telephone Encounter (Signed)
Call received from patient. He explained that he has not received the medication that was supposed to be mailed to him.   [facility] application completed with patient over the phone. Informed him that this CM would check with [facility] and request medication be mailed to patient.   He also inquired about the status of his CPAP.  Explained to him that he needs to call [facility] and complete the hardship application. They have tried to contact him. Reminded him that the application was sent to his prior address and he needs to call the company to confirm that they are sending an application to his current address.  Provided him with the phone # for [facility].   He said that he contacted [facility] and is still waiting for the application.  He currently has a place to live but he stated that it is not permanent. Provided him with the phone # for [place] to End Homelessness that covers [facility]. # [phone].  He also explained that he is not able to see the podiatrist due to inability to afford the $200 needed to be seen. He has applied for medicaid and an application is pending. Explained to him that he is not eligible for the [facility] because he is not living in [place]. He can apply for [employer]  if he is denied medicaid.  Informed him to contact this CM when he received notification of the medicaid application.   As per Georgiann Mohs, [facility], the pharmacy will be able to fill the levothyroxine for patient and will mail to him.

## 2019-06-24 LAB — FECAL OCCULT BLOOD, IMMUNOCHEMICAL: Fecal Occult Bld: NEGATIVE

## 2019-06-26 ENCOUNTER — Telehealth: Payer: Self-pay

## 2019-06-26 NOTE — Telephone Encounter (Signed)
Contacted pt to go over Fit Test results was unable to lvm due to vm being full

## 2019-06-30 ENCOUNTER — Telehealth: Payer: Self-pay

## 2019-06-30 ENCOUNTER — Encounter: Payer: Self-pay | Admitting: Critical Care Medicine

## 2019-06-30 ENCOUNTER — Telehealth: Payer: Self-pay | Admitting: Family Medicine

## 2019-06-30 MED FILL — ALBUTEROL SUL 2.5 MG/3 ML S: (2.5 MG/3ML | 7 days supply | Qty: 90 | Fill #1

## 2019-06-30 NOTE — Telephone Encounter (Signed)
Letter requesting support with utility payment faxed to [place] as requested by the patient.

## 2019-06-30 NOTE — Telephone Encounter (Signed)
Call placed to [facility], spoke to [place] who said that they talked to patient on 06/22/2019 and he still needs to submit hardship application.

## 2019-06-30 NOTE — Telephone Encounter (Signed)
Call received from patient.   He needs assistance with paying his water bill and has contacted [facility].  He spoke to [employer] # [id number] and she is requesting a letter stating that he is under a doctor's care and is waiting for a decision regarding his disability application.  The letter needs to be faxed to Ms Rosana Hoes - fax # [phone] and the deadline is today.   He also explained that he is sending  off the hardship application for the CPAP with [employer].

## 2019-06-30 NOTE — Telephone Encounter (Signed)
Addendum to prior entry - the letter also needs to state that the patient is not able to work

## 2019-06-30 NOTE — Telephone Encounter (Signed)
Letter is complete and will give to you Opal Sidles

## 2019-07-08 ENCOUNTER — Telehealth: Payer: Self-pay

## 2019-07-08 NOTE — Telephone Encounter (Signed)
Call placed to [facility], spoke to [place] who stated that the patient completed the hardship application and has been approved but he has not yet received his CPAP machine.

## 2019-07-14 ENCOUNTER — Telehealth: Payer: Self-pay

## 2019-07-14 NOTE — Telephone Encounter (Signed)
I will follow up with mr Gigante,  He does not need a nebulizer at this time

## 2019-07-14 NOTE — Telephone Encounter (Signed)
Call received from patient stating that he received a letter informing him that he has been approved for 100% [employer] .  He now needs to call podiatry to schedule his appointment.  He was inquiring about the need for nebulizer medication. He said that he had it in the past but does not currently have anything other than his inhalers. He said that he does not have a nebulizer but will use a friend's machine. Informed him that this CM will check with his provider about this request. [facility] can provide him with a nebulizer if needed.

## 2019-07-20 MED FILL — !COLCRYS 0.6 MG TABLET: 0.6 MG | 10 days supply | Qty: 30 | Fill #2

## 2019-07-20 MED FILL — LABETALOL HCL 200 MG TABLET: 200 | 30 days supply | Qty: 90 | Fill #2

## 2019-07-20 MED FILL — ?AMLODIPINE BESYLATE 10 MG: 10 | 30 days supply | Qty: 30 | Fill #1

## 2019-07-20 MED FILL — ALBUTEROL SUL 2.5 MG/3 ML S: (2.5 MG/3ML | 7 days supply | Qty: 90 | Fill #1

## 2019-07-21 ENCOUNTER — Telehealth: Payer: Self-pay

## 2019-07-21 NOTE — Telephone Encounter (Signed)
Call received from patient. He said that he called the foot center and they told him that he needs the [employer].  This CM explained to him that he is not eligible for the [facility] card because he is not living in [place].  Provided him with the number for [facility] in [place] to inquire about financial assistance for individuals in [place].  He also noted that he will be checking with DSS about the status of his medicaid application.

## 2019-07-21 NOTE — Telephone Encounter (Signed)
Call received from patient requesting contact number for [facility] [place]. He said that he now has been approved for 100% [employer]  and wants to schedule a podiatry appointment.   He also explained that he needs to call [facility] back to enroll with transportation because he never received the application.   He had questions about medication coverage and delivery. Instructed him to call [facility] pharmacy and provided him with the contact number.

## 2019-07-22 ENCOUNTER — Other Ambulatory Visit: Payer: Self-pay

## 2019-07-22 ENCOUNTER — Encounter: Payer: Self-pay | Admitting: Critical Care Medicine

## 2019-07-22 ENCOUNTER — Ambulatory Visit: Payer: Self-pay | Attending: Critical Care Medicine | Admitting: Critical Care Medicine

## 2019-07-22 ENCOUNTER — Telehealth: Payer: Self-pay

## 2019-07-22 DIAGNOSIS — J432 Centrilobular emphysema: Secondary | ICD-10-CM

## 2019-07-22 DIAGNOSIS — I5032 Chronic diastolic (congestive) heart failure: Secondary | ICD-10-CM

## 2019-07-22 DIAGNOSIS — M1A00X0 Idiopathic chronic gout, unspecified site, without tophus (tophi): Secondary | ICD-10-CM

## 2019-07-22 DIAGNOSIS — L2089 Other atopic dermatitis: Secondary | ICD-10-CM

## 2019-07-22 DIAGNOSIS — B351 Tinea unguium: Secondary | ICD-10-CM

## 2019-07-22 DIAGNOSIS — E039 Hypothyroidism, unspecified: Secondary | ICD-10-CM

## 2019-07-22 DIAGNOSIS — I10 Essential (primary) hypertension: Secondary | ICD-10-CM

## 2019-07-22 DIAGNOSIS — L309 Dermatitis, unspecified: Secondary | ICD-10-CM

## 2019-07-22 MED ORDER — LABETALOL HCL 200 MG PO TABS: 200.0000 mg | ORAL_TABLET | Freq: Three times a day (TID) | ORAL | 3 refills | Status: DC

## 2019-07-22 MED ORDER — LEVOTHYROXINE SODIUM 100 MCG PO TABS: 100.0000 ug | ORAL_TABLET | Freq: Every day | ORAL | 3 refills | Status: DC

## 2019-07-22 MED ORDER — DULERA 100-5 MCG/ACT IN AERO: 2.0000 | INHALATION_SPRAY | Freq: Two times a day (BID) | RESPIRATORY_TRACT | 2 refills | Status: DC

## 2019-07-22 MED ORDER — COLCHICINE 0.6 MG PO TABS: ORAL_TABLET | ORAL | 3 refills | Status: DC

## 2019-07-22 MED FILL — !DULERA 100 MCG/5 MCG INH: 100-5 | 30 days supply | Qty: 13 | Fill #0

## 2019-07-22 MED FILL — ?LEVOTHYROXINE 100 MCG TABL: 100 | 30 days supply | Qty: 30 | Fill #0

## 2019-07-22 NOTE — Progress Notes (Signed)
Patient ID: Johnny [name], male   DOB: 08/09/1961, 58 y.o.   MRN: [id number] Virtual Visit via Telephone Note  I connected with [name] on 07/22/19 at 10:00 AM EDT by telephone and verified that I am speaking with the correct person using two identifiers.   Consent:  I discussed the limitations, risks, security and privacy concerns of performing an evaluation and management service by telephone and the availability of in person appointments. I also discussed with the patient that there may be a patient responsible charge related to this service. The patient expressed understanding and agreed to proceed.  Location of patient: Patient was at home  Location of provider: I was in my office Persons participating in the televisit with the patient.    No one else on the call  History of Present Illness: 58 y.o.M here by way of a telephone visit for follow-up of hypertension, stage IV chronic kidney disease with an AV fistula already in place, sleep apnea not treated, chronic obstructive lung disease, chronic diastolic heart failure, homelessness, chronic hepatitis C, eczema of the hand,  Patient was last seen in August Patient was housed in a hotel and is now back with friends living in the [place] of town.  He is interested in obtaining more stable housing closer in to the city because of transportation barriers but has not been able to achieve that yet and needs to reside in housing which is actually in [facility]  The patient never picked up the CPAP machine we ordered he did have diagnosed obstructive sleep apnea in 2016.  The patient denies any chest pain shortness of breath or cough.  The patient does note ongoing wheezing and has significant fatigue.  The patient also complains of ongoing toenail fungus he was not able to go to the podiatry appointment as he could not get an orange card since he lives in [place] he is trying to apply for Medicaid and this is pending  Patient  notes his gout may be flaring as well and he is run out of his colchicine he is taking the allopurinol.  He has been using the [facility] as needed and the Ventolin on a more regular basis which is the opposite of what we recommended.  He is also about out of labetalol and thyroid medication.  We started him on Synthroid at the last visit 50 mcg daily and note his thyroid function showed hypothyroidism.  He did have a fecal occult blood study done and it was negative for blood.  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    Constitutional:   No  weight loss, night sweats,  Fevers, chills, fatigue, lassitude. HEENT:   No headaches,  Difficulty swallowing,  Tooth/dental problems,  Sore throat,                No sneezing, itching, ear ache, nasal congestion, post nasal drip,   CV:  No chest pain,  Orthopnea, PND, swelling in lower extremities, anasarca, dizziness, palpitations  GI  No heartburn, indigestion, abdominal pain, nausea, vomiting, diarrhea, change in bowel habits, loss of appetite  Resp: shortness of breath with exertion or at rest.  No excess mucus, no productive cough,  No non-productive cough,  No coughing up of blood.  No change in color of mucus.  wheezing.  No chest wall deformity  Skin: no rash or lesions.  GU: no dysuria, change in color of urine, no urgency or frequency.  No flank pain.  MS:  joint pain or swelling.  No decreased range of motion.  No back pain.  Psych:   change in mood or affect.  depression or anxiety.  No memory loss.   Observations/Objective: No observations as this is a telephone visit  Assessment and Plan: Chronic diastolic congestive heart failure (HCC) Chronic diastolic heart failure stable at this time  We will continue beta-blocker and diuretic along with amlodipine We will also refill the labetalol  Essential hypertension Hypertension under fair control we will continue current medication profile  COPD (chronic obstructive pulmonary  disease) (HCC) Chronic obstructive lung disease will obtain for the patient now Dulera 100 strength 2 inhalations twice daily and albuterol as needed and needs to use the [facility] on a scheduled basis and albuterol only on an as-needed basis and we will send a refill to [facility] to the patient's home  The patient is no longer smoking at this time  OSA (obstructive sleep apnea) The patient cannot afford the CPAP machine at this time  External otitis of right ear The patient is no longer having difficulty with otitis in the ear  Eczema of hand The triamcinolone cream only partially helped the hand and now he has eczema up the arm we will now refer him to family medicine dermatology  Anxiety and depression I have asked our licensed clinical social worker to engage with the patient over his severe anxiety and depression once more as he has had a previous visit in [place] We will continue atorvastatin  Homelessness  the patient is hoping to achieve Medicaid and be able to move back into the [place] area soon  History of tobacco use disorder The patient is no longer smoking at this time     Follow Up Instructions: The patient knows an in office point will be made and we will mail him his club colchicine thyroid and Dulera and labetalol medications   I discussed the assessment and treatment plan with the patient. The patient was provided an opportunity to ask questions and all were answered. The patient agreed with the plan and demonstrated an understanding of the instructions.   The patient was advised to call back or seek an in-person evaluation if the symptoms worsen or if the condition fails to improve as anticipated.  I provided  30 minutes of non-face-to-face time during this encounter  including  median intraservice time , review of notes, labs, imaging, medications  and explaining diagnosis and management to the patient .    Asencion Noble, MD

## 2019-07-22 NOTE — Telephone Encounter (Addendum)
Call placed to [facility] regarding the status of CPAP order. Spoke to [place] who stated that he has been approved for the hardship program 100%.  They are still working on the application and will contact him when the machine is ready.  Call placed to [facility] to inquire about podiatry resources.  Spoke to [facility] specialist who stated they use [place] # [phone].  She said that the patient will need to complete the [place] care application but should call the clinic first for more information.   [facility] does accept uninsured patients.  Patients  need to complete the application for financial assistance but the services are not free for anyone.  The minimum cost is $20/visit.   Call placed to patient and informed him of above and provided him with the phone number for [facility].

## 2019-07-23 NOTE — Telephone Encounter (Signed)
Very much appreciate Opal Sidles

## 2019-08-13 ENCOUNTER — Telehealth: Payer: Self-pay

## 2019-08-13 NOTE — Telephone Encounter (Signed)
Call placed to [facility], spoke to [place] who said that the patient was set up with his CPAP machine on 07/30/2019.

## 2019-08-17 ENCOUNTER — Ambulatory Visit: Payer: Self-pay | Admitting: Critical Care Medicine

## 2019-08-25 MED FILL — ?LEVOTHYROXINE 100 MCG TABL: 100 | 30 days supply | Qty: 30 | Fill #1

## 2019-08-27 ENCOUNTER — Telehealth: Payer: Self-pay

## 2019-08-27 ENCOUNTER — Telehealth: Payer: Self-pay | Admitting: Family Medicine

## 2019-08-27 NOTE — Telephone Encounter (Signed)
Call received from the patient requesting a letter from Dr Joya Gaskins for the [employer] noting his need for assistance with paying his water bill.  The letter needs to state that he is not able to work and is under the physician's care for his chronic medical issues.   Letter can be faxed to [employer] of [place] fax # [phone].  Informed him that Dr Joya Gaskins will be out of the office until next week.  The patient said that was fine. The letter was not urgent.   He also noted that he received a letter informing him that has been approved for medicaid and he is waiting for his card.

## 2019-08-31 ENCOUNTER — Telehealth: Payer: Self-pay

## 2019-08-31 ENCOUNTER — Encounter: Payer: Self-pay | Admitting: Critical Care Medicine

## 2019-08-31 NOTE — Telephone Encounter (Signed)
I just printed a letter for this patient.  Please fax to [employer] of [place] at [phone]

## 2019-08-31 NOTE — Telephone Encounter (Signed)
Call received from patient requesting status of letter.  Informed him that Dr Joya Gaskins has been notified and this CM will fax it to [employer] when its ready

## 2019-08-31 NOTE — Telephone Encounter (Signed)
Letter requesting support with paying water bills faxed to [employer] as patient requested.

## 2019-09-01 ENCOUNTER — Ambulatory Visit: Payer: Self-pay | Admitting: Critical Care Medicine

## 2019-09-01 ENCOUNTER — Encounter: Payer: Self-pay | Admitting: Critical Care Medicine

## 2019-09-01 ENCOUNTER — Ambulatory Visit: Payer: Medicaid Other | Attending: Critical Care Medicine | Admitting: Critical Care Medicine

## 2019-09-01 DIAGNOSIS — J432 Centrilobular emphysema: Secondary | ICD-10-CM | POA: Diagnosis not present

## 2019-09-01 DIAGNOSIS — M1A00X0 Idiopathic chronic gout, unspecified site, without tophus (tophi): Secondary | ICD-10-CM | POA: Diagnosis not present

## 2019-09-01 DIAGNOSIS — G4733 Obstructive sleep apnea (adult) (pediatric): Secondary | ICD-10-CM

## 2019-09-01 DIAGNOSIS — I10 Essential (primary) hypertension: Secondary | ICD-10-CM

## 2019-09-01 DIAGNOSIS — N184 Chronic kidney disease, stage 4 (severe): Secondary | ICD-10-CM | POA: Diagnosis not present

## 2019-09-01 NOTE — Progress Notes (Signed)
Patient ID: Johnny [name], male   DOB: 12-Dec-1960, 58 y.o.   MRN: [id number] Virtual Visit via Telephone Note  I connected with Johnny [name] on 09/01/19 at  3:30 PM EDT by telephone and verified that I am speaking with the correct person using two identifiers.   Consent:  I discussed the limitations, risks, security and privacy concerns of performing an evaluation and management service by telephone and the availability of in person appointments. I also discussed with the patient that there may be a patient responsible charge related to this service. The patient expressed understanding and agreed to proceed.  Location of patient: The patient was at home  Location of provider: I was in the office  Persons participating in the televisit with the patient.   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  I then rebooked him onto the schedule for a telephone visit.  This patient has a history of sleep apnea COPD and hypertension.  He was last seen in September on a telephone visit as noted above.  He states his breathing is at baseline he is having no cough he has minimal edema.  He has had a couple of gout flares since we spoke in September.  He is maintaining Dulera and as needed Ventolin.  He does have the CPAP machine now he feels this is helping as well.  He notes significant depression anxiety and frustration over his circumstance.  He still has housing but it is in [facility].  He does have his Medicaid now his anxiety and frustration has improved since he did get his Medicaid and he will be  following up with nephrology soon.  Pos in [place] Constitutional:   No  weight loss, night sweats,  Fevers, chills, fatigue, lassitude. HEENT:   No headaches,  Difficulty swallowing,  Tooth/dental problems,  Sore throat,                No sneezing, itching, ear ache, nasal congestion, post nasal drip,   CV:  No chest pain,  Orthopnea, PND, swelling in lower extremities, anasarca, dizziness, palpitations  GI  No heartburn, indigestion, abdominal pain, nausea, vomiting, diarrhea, change in bowel habits, loss of appetite  Resp: shortness of breath with exertion or at rest.  No excess mucus, no productive cough,  No non-productive cough,  No coughing up of blood.  No change in color of mucus.  wheezing.  No chest wall deformity  Skin: no rash or lesions.  GU: no dysuria, change in color of urine, no urgency or frequency.  No flank pain.  MS:   joint pain or swelling.  No decreased range of motion.  No back pain.  Psych:   change in mood or affect.  depression or anxiety.  No memory loss.  No observations this is a telephone visit  Assessment and Plan: Chronic diastolic congestive heart failure (HCC) Chronic diastolic heart failure stable at this time  We will continue beta-blocker and diuretic along with amlodipine We will also refill the labetalol  Essential hypertension Hypertension under fair control we will continue current medication profile  COPD (chronic obstructive pulmonary disease) (HCC) Chronic obstructive lung disease will continue Dulera 100 strength 2 inhalations twice daily and albuterol as needed and needs to use the [facility] on a scheduled basis and albuterol only on an as-needed basis and we will send a refill to [facility] to the patient's home  The patient is no longer smoking at this time  OSA (obstructive sleep apnea) Pt now has cpap machine   Anxiety and depression This has improved  Hyperlipidemia We will continue  atorvastatin  Homelessness  the pt has medicaid and his disability came through, hopefully can move to [place] soon with housing  History of tobacco use disorder The patient is no longer smoking at this time   Follow Up Instructions: The patient knows will be a follow-up visit scheduled within the next month   I discussed the assessment and treatment plan with the patient. The patient was provided an opportunity to ask questions and all were answered. The patient agreed with the plan and demonstrated an understanding of the instructions.   The patient was advised to call back or seek an in-person evaluation if the symptoms worsen or if the condition fails to improve as anticipated.  I provided 28minutes of non-face-to-face time during this encounter  including  median intraservice time , review of notes, labs, imaging, medications  and explaining diagnosis and management to the patient .    Asencion Noble, MD

## 2019-09-09 ENCOUNTER — Telehealth: Payer: Self-pay | Admitting: Family Medicine

## 2019-09-09 MED FILL — LABETALOL HCL 200 MG TABLET: 200 | 30 days supply | Qty: 90 | Fill #0

## 2019-09-09 NOTE — Telephone Encounter (Signed)
I called the Pt returning his call, unable to LVM since is full

## 2019-09-09 NOTE — Telephone Encounter (Signed)
Patient called requesting to speak with Johnny [name] regarding the OC and BC. Please f/u

## 2019-09-10 ENCOUNTER — Telehealth: Payer: Self-pay | Admitting: Family Medicine

## 2019-09-10 NOTE — Telephone Encounter (Signed)
Pt claim that he has medicaid, he was explain if he has medicaid he can not apply for th [facility] pharmacy is only for person that has not insurance

## 2019-09-10 NOTE — Telephone Encounter (Signed)
Patient called wanting to know if he could get help applying for the [facility] states he doesn't have transportation please follow up

## 2019-09-14 ENCOUNTER — Telehealth: Payer: Self-pay

## 2019-09-14 NOTE — Telephone Encounter (Signed)
Done

## 2019-09-14 NOTE — Telephone Encounter (Addendum)
Call received from the patient stating the the [employer] was not able to assist with payment of his water bill.  They informed him that they do not have an application from him and he needs to submit another. He said he is working on that.   He said that he contacted [place] about assistance with the  electric bills - heating and cooling.  They are requesting a letter stating that due to his medical conditions, he needs to have the electricity remain on and the temperature needs to be kept within certain parameters. The letter can be faxed to the attention of Ms Dorthula Rue - fax # [phone]. He also noted that the deadline for receiving this letter is tomorrow - 09/15/2019.  Informed him that Dr Margarita Rana would be notified of this request.

## 2019-09-15 ENCOUNTER — Telehealth: Payer: Self-pay

## 2019-09-15 NOTE — Telephone Encounter (Signed)
I spoke to Ms [facility] DSS. She explained that they are a crisis program and because the temperatures are mild and there is no weather warranted crisis,  they need to establish the health risk for the applicant.     The letter needs to state: due to medical conditions, the temperature in the home must be maintained or his health is at risk.   Informed her that this letter will be faxed when completed which may not be until tomorrow.

## 2019-09-15 NOTE — Telephone Encounter (Signed)
Done

## 2019-09-15 NOTE — Telephone Encounter (Signed)
Call received from patient. He said that the DSS caseworker will not accept the letter requesting electricity assistance . He asked that this CM speak to the caseworker, Ms Dorthula Rue # [phone] for specific information needed in the letter.  Message left for Ms Dorthula Rue requesting a call back to this CM

## 2019-09-15 NOTE — Telephone Encounter (Signed)
Thank you so much for your time on this [place].   Thank you Opal Sidles

## 2019-09-15 NOTE — Telephone Encounter (Signed)
Letter faxed to Ms Johnny [name] as requested

## 2019-09-16 NOTE — Telephone Encounter (Signed)
Updated letter faxed to Ms Johnny [name] at [place]

## 2019-09-18 MED FILL — LABETALOL HCL 200 MG TABLET: 200 | 30 days supply | Qty: 90 | Fill #0

## 2019-09-21 ENCOUNTER — Telehealth: Payer: Self-pay

## 2019-09-21 NOTE — Telephone Encounter (Signed)
Call received from [place] # [phone]. She explained that she has never spoken to the patient directly. She said that some of the pledges that he was expecting for assistance with his bill, have not come through.  The [employer] said that he does not qualify for assistance because the account is not in his name.  The [employer] reached out to his landlord and the landlord denied any need for assistance. Tammy explained that the best option is for the patient to contact her and set up a flex pay account. It would cost $60 to put the account in his name and a payment plan would be established. She said this CM could provide him with her direct number. She is also faxing this CM paperwork for " life support" assistance but she doubts he would qualify because his power has been off for a few days.   Call placed to patient and the information from [place] was explained and he was provided Tammy's direct contact #.

## 2019-09-21 NOTE — Telephone Encounter (Signed)
Call received from patient stating that his power has been turned off.  He was with his friend, Asencion Partridge, who was trying to provide some assistance. She explained that he owes $318 - $100 will be paid by [facility] and possibly $95 by the [employer].  He said that more information is needed noting that he has a CPAP and uses a nebulizer in order to have the power turned.   This CM explained that the letter was sent to Ms Dorthula Rue at [place] noting exactly what was requested.  He said that he has now contacted [employer].   This CM requested the contact for [employer] and Asencion Partridge provided the names of [employer], newspaper) # [phone].   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   Call placed to [place] who stated that a payment pledge is needed but she will have Tammy contact this CM.

## 2019-09-28 MED FILL — ?AMLODIPINE BESYLATE 10 MG: 10 | 30 days supply | Qty: 30 | Fill #2

## 2019-09-28 MED FILL — LABETALOL HCL 200 MG TABLET: 200 | 30 days supply | Qty: 90 | Fill #3

## 2019-10-05 DIAGNOSIS — I10 Essential (primary) hypertension: Secondary | ICD-10-CM | POA: Diagnosis not present

## 2019-10-05 MED FILL — ALLOPURINOL 100 MG TABLET: 100 | 30 days supply | Qty: 60 | Fill #0

## 2019-10-05 MED FILL — MITIGARE 0.6 MG CAPSULE: 0.6 | 5 days supply | Qty: 15 | Fill #0

## 2019-10-06 ENCOUNTER — Other Ambulatory Visit: Payer: Self-pay | Admitting: *Deleted

## 2019-10-06 LAB — CBC WITH DIFFERENTIAL/PLATELET
Basophils Absolute: 0.1 10*3/uL (ref 0.0–0.2)
Basos: 2 %
EOS (ABSOLUTE): 0.4 10*3/uL (ref 0.0–0.4)
Eos: 5 %
Hematocrit: 39 % (ref 37.5–51.0)
Hemoglobin: 12.9 g/dL — ABNORMAL LOW (ref 13.0–17.7)
Immature Grans (Abs): 0 10*3/uL (ref 0.0–0.1)
Immature Granulocytes: 0 %
Lymphocytes Absolute: 2.5 10*3/uL (ref 0.7–3.1)
Lymphs: 33 %
MCH: 26.1 pg — ABNORMAL LOW (ref 26.6–33.0)
MCHC: 33.1 g/dL (ref 31.5–35.7)
MCV: 79 fL (ref 79–97)
Monocytes Absolute: 0.9 10*3/uL (ref 0.1–0.9)
Monocytes: 12 %
Neutrophils Absolute: 3.5 10*3/uL (ref 1.4–7.0)
Neutrophils: 48 %
Platelets: 192 10*3/uL (ref 150–450)
RBC: 4.94 x10E6/uL (ref 4.14–5.80)
RDW: 15.8 % — ABNORMAL HIGH (ref 11.6–15.4)
WBC: 7.4 10*3/uL (ref 3.4–10.8)

## 2019-10-06 MED ORDER — ALBUTEROL SULFATE HFA 108 (90 BASE) MCG/ACT IN AERS: 1.0000 | INHALATION_SPRAY | Freq: Four times a day (QID) | RESPIRATORY_TRACT | 1 refills | Status: DC | PRN

## 2019-10-06 MED ORDER — DULERA 100-5 MCG/ACT IN AERO: 2.0000 | INHALATION_SPRAY | Freq: Two times a day (BID) | RESPIRATORY_TRACT | 2 refills | Status: DC

## 2019-10-06 MED FILL — DULERA 100 MCG/5 MCG INH: 100-5 | 30 days supply | Qty: 13 | Fill #0

## 2019-10-06 MED FILL — ALBUTEROL SULFATE HFA 108 (: 108 (90 BAS | 18 days supply | Qty: 18 | Fill #0

## 2019-10-12 NOTE — Progress Notes (Signed)
Subjective:    Patient ID: Johnny [name], male    DOB: [birth year], 58 y.o.   MRN: [id number] Virtual Visit via Telephone Note  I connected with Johnny [name] on 10/13/19 at 11:00 AM EST by telephone and verified that I am speaking with the correct person using two identifiers.   Consent:  I discussed the limitations, risks, security and privacy concerns of performing an evaluation and management service by telephone and the availability of in person appointments. I also discussed with the patient that there may be a patient responsible charge related to this service. The patient expressed understanding and agreed to proceed.  Location of patient: The patient was calling from his phone on the streets  Location of provider: I was in my office  Persons participating in the televisit with the patient.   No one else on the call    History of Present Illness:    58 y.o.M here for follow-up of hypertension, stage IV chronic kidney disease with an AV fistula already in place, sleep apnea not treated, chronic obstructive lung disease, chronic diastolic heart failure, homelessness, chronic hepatitis C, eczema of the hand,  Patient was last seen in January.  Patient was housed in a hotel and is now back with friends living in the [place] of town.  He is interested in obtaining more stable housing closer in to the city because of transportation barriers  The patient never picked up the CPAP machine we ordered he did have diagnosed obstructive sleep apnea in 2016.  The patient denies any chest pain shortness of breath or cough.  We had ordered a chest x-ray previously but the patient never followed up on that he also needs follow-up labs at this visit.  Another issue is that the patient needs a podiatrist for significant toenail fungus  12/8 Last visit was a telephone note.  The patient states since the last visit he still having some wheezing and cough and some shortness of breath.  He is  wearing his CPAP machine.  He has some edema in the feet.  He is out of his furosemide.  He is not yet received a referral to nephrology.  He does not have a blood pressure cuff at this time.  Unfortunately he is now back on the street again and is no longer living with friends.   Hypertension This is a chronic problem. The current episode started more than 1 month ago. The problem has been gradually improving since onset. The problem is controlled. Associated symptoms include anxiety and shortness of breath. Pertinent negatives include no blurred vision, chest pain, headaches, malaise/fatigue, neck pain, orthopnea or palpitations. (Not able to sleep, something in his ear right Notes some pain on Left arm, vein is sore.) Risk factors for coronary artery disease include sedentary lifestyle, male gender and stress (stress issues: trying to accomplish activity. pt was homeless. try to finish school in [place]). Compliance problems include medication cost.  Hypertensive end-organ damage includes kidney disease. There is no history of angina, CVA, heart failure, left ventricular hypertrophy, PVD or retinopathy. Identifiable causes of hypertension include chronic renal disease and sleep apnea. There is no history of coarctation of the aorta, hyperparathyroidism, a hypertension causing med, pheochromocytoma, renovascular disease or a thyroid problem.    Past Medical History:  Diagnosis Date  . Adenomatous colon polyp    tubular  . Chronic kidney disease   . COPD (chronic obstructive pulmonary disease) ([place])   . Diverticulosis   . External  otitis of right ear 11/10/2018  . Hepatitis C   . Hypertension   . OSA (obstructive sleep apnea) 11/03/2015     Family History  Problem Relation Age of Onset  . Hypertension Mother   . Diabetes Mother   . Colon cancer Neg Hx      Social History   Socioeconomic History  . Marital status: Divorced    Spouse name: Not on file  . Number of children: Not on file  .  Years of education: 6  . Highest education level: Not on file  Occupational History  . Occupation: Electronics engineer  Social Needs  . Financial resource strain: Not on file  . Food insecurity    Worry: Not on file    Inability: Not on file  . Transportation needs    Medical: Not on file    Non-medical: Not on file  Tobacco Use  . Smoking status: Former Smoker    Packs/day: 0.10    Types: Cigarettes    Quit date: 04/02/2011    Years since quitting: 8.5  . Smokeless tobacco: Never Used  . Tobacco comment: trying to quit  Substance and Sexual Activity  . Alcohol use: Never    Alcohol/week: 0.0 standard drinks    Frequency: Never  . Drug use: No  . Sexual activity: Not Currently  Lifestyle  . Physical activity    Days per week: Not on file    Minutes per session: Not on file  . Stress: Not on file  Relationships  . Social Herbalist on phone: Not on file    Gets together: Not on file    Attends religious service: Not on file    Active member of club or organization: Not on file    Attends meetings of clubs or organizations: Not on file    Relationship status: Not on file  . Intimate partner violence    Fear of current or ex partner: Not on file    Emotionally abused: Not on file    Physically abused: Not on file    Forced sexual activity: Not on file  Other Topics Concern  . Not on file  Social History Narrative   Lives at home alone.   Right-handed.    Occasional caffeine use.     Allergies  Allergen Reactions  . Naproxen Other (See Comments)    Unknown     Outpatient Medications Prior to Visit  Medication Sig Dispense Refill  . triamcinolone cream (KENALOG) 0.1 % Apply 1 application topically 2 (two) times daily. 45 g 1  . albuterol (VENTOLIN HFA) 108 (90 Base) MCG/ACT inhaler Inhale 1-2 puffs into the lungs every 6 (six) hours as needed for wheezing or shortness of breath. 18 g 1  . allopurinol (ZYLOPRIM) 100 MG tablet Take 2 tablets (200 mg total)  by mouth daily. 60 tablet 5  . amLODipine (NORVASC) 10 MG tablet Take 1 tablet (10 mg total) by mouth daily. 30 tablet 5  . atorvastatin (LIPITOR) 40 MG tablet Take 1 tablet (40 mg total) by mouth daily. 30 tablet 3  . colchicine 0.6 MG tablet 2 tabs po x 1, then one tab po 1 hour later prn Gout flare 30 tablet 3  . furosemide (LASIX) 20 MG tablet Take 1 tablet (20 mg total) by mouth daily as needed for edema. 30 tablet 3  . labetalol (NORMODYNE) 200 MG tablet Take 1 tablet (200 mg total) by mouth 3 (three) times daily. 90 tablet  3  . levothyroxine (SYNTHROID) 100 MCG tablet Take 1 tablet (100 mcg total) by mouth daily. 30 tablet 3  . mometasone-formoterol (DULERA) 100-5 MCG/ACT AERO Inhale 2 puffs into the lungs 2 (two) times daily. 13 g 2   No facility-administered medications prior to visit.      Review of Systems  Constitutional: Negative.  Negative for malaise/fatigue.  HENT: Negative.   Eyes: Negative for blurred vision.  Respiratory: Positive for chest tightness, shortness of breath and wheezing.   Cardiovascular: Positive for leg swelling. Negative for chest pain, palpitations and orthopnea.  Genitourinary: Negative.   Musculoskeletal: Negative.  Negative for neck pain.  Neurological: Negative for headaches.  Hematological: Negative.   Psychiatric/Behavioral: Negative.        Objective:   Physical Exam  There were no vitals filed for this visit.  No observations this is a phone visit  BMP Latest Ref Rng & Units 06/10/2019 04/10/2019 11/10/2018  Glucose 65 - 99 mg/dL 79 103(H) 92  BUN 6 - 24 mg/dL 45(H) 40(H) 29(H)  Creatinine 0.76 - 1.27 mg/dL 3.41(H) 4.26(H) 3.37(H)  BUN/Creat Ratio 9 - 20 13 9 9   Sodium 134 - 144 mmol/L 140 142 140  Potassium 3.5 - 5.2 mmol/L 4.0 4.2 4.3  Chloride 96 - 106 mmol/L 101 108(H) 102  CO2 20 - 29 mmol/L 24 18(L) 24  Calcium 8.7 - 10.2 mg/dL 9.4 8.7 9.4   CBC Latest Ref Rng & Units 10/05/2019 06/10/2019 11/10/2018  WBC 3.4 - 10.8 x10E3/uL 7.4  7.9 8.8  Hemoglobin 13.0 - 17.7 g/dL 12.9(L) 13.8 14.4  Hematocrit 37.5 - 51.0 % 39.0 41.1 41.0  Platelets 150 - 450 x10E3/uL 192 205 227   Lipid Panel     Component Value Date/Time   CHOL 214 (H) 11/10/2018 1104   TRIG 80 11/10/2018 1104   HDL 96 11/10/2018 1104   CHOLHDL 2.2 11/10/2018 1104   CHOLHDL 2.3 12/07/2016 1006   VLDL 16 02/22/2009 0635   LDLCALC 102 (H) 11/10/2018 1104       Assessment & Plan:  I personally reviewed all images and lab data in the [facility] system as well as any outside material available during this office visit and agree with the  radiology impressions.   Chronic diastolic congestive heart failure (HCC) Chronic diastolic heart failure appears to be stable at this time will refill amlodipine and furosemide along with labetalol  Essential hypertension As per heart failure assessment  COPD (chronic obstructive pulmonary disease) (HCC) Will refill albuterol and Dulera  OSA (obstructive sleep apnea) The patient is using his CPAP machine nightly  CKD (chronic kidney disease) stage 4, GFR 15-29 ml/min (HCC) The patient now has Medicaid so I will attempt to get the patient back in with nephrology  Hypothyroidism Refill on levothyroxine was also prescribed   Keny[place] was seen today for hypertension.  Diagnoses and all orders for this visit:  Chronic diastolic congestive heart failure (HCC)  Essential hypertension -     amLODipine (NORVASC) 10 MG tablet; Take 1 tablet (10 mg total) by mouth daily. -     labetalol (NORMODYNE) 200 MG tablet; Take 1 tablet (200 mg total) by mouth 3 (three) times daily.  Centrilobular emphysema (HCC)  OSA (obstructive sleep apnea)  CKD (chronic kidney disease) stage 4, GFR 15-29 ml/min (HCC) -     furosemide (LASIX) 20 MG tablet; Take 1 tablet (20 mg total) by mouth daily as needed for edema. -     Ambulatory referral to Nephrology  Idiopathic chronic gout without tophus, unspecified site -     allopurinol (ZYLOPRIM)  100 MG tablet; Take 2 tablets (200 mg total) by mouth daily. -     colchicine 0.6 MG tablet; 2 tabs po x 1, then one tab po 1 hour later prn Gout flare  Homelessness  Anxiety and depression  Chronic hepatitis C without hepatic coma (HCC)  A-V fistula (HCC)  Acquired hypothyroidism  Other orders -     atorvastatin (LIPITOR) 40 MG tablet; Take 1 tablet (40 mg total) by mouth daily. -     levothyroxine (SYNTHROID) 100 MCG tablet; Take 1 tablet (100 mcg total) by mouth daily. -     mometasone-formoterol (DULERA) 100-5 MCG/ACT AERO; Inhale 2 puffs into the lungs 2 (two) times daily. -     albuterol (VENTOLIN HFA) 108 (90 Base) MCG/ACT inhaler; Inhale 1-2 puffs into the lungs every 6 (six) hours as needed for wheezing or shortness of breath. -     Blood Pressure Monitoring (3 SERIES BP MONITOR/UPPER ARM) DEVI; 1 Units by Does not apply route 2 (two) times daily.   Follow Up Instructions:    I discussed the assessment and treatment plan with the patient. The patient was provided an opportunity to ask questions and all were answered. The patient agreed with the plan and demonstrated an understanding of the instructions.   The patient was advised to call back or seek an in-person evaluation if the symptoms worsen or if the condition fails to improve as anticipated.  I provided 30 minutes of non-face-to-face time during this encounter  including  median intraservice time , review of notes, labs, imaging, medications  and explaining diagnosis and management to the patient .    Asencion Noble, MD

## 2019-10-13 ENCOUNTER — Encounter: Payer: Self-pay | Admitting: Critical Care Medicine

## 2019-10-13 ENCOUNTER — Telehealth: Payer: Self-pay

## 2019-10-13 ENCOUNTER — Ambulatory Visit: Payer: Medicaid Other | Attending: Critical Care Medicine | Admitting: Critical Care Medicine

## 2019-10-13 ENCOUNTER — Other Ambulatory Visit: Payer: Self-pay

## 2019-10-13 DIAGNOSIS — M1A00X0 Idiopathic chronic gout, unspecified site, without tophus (tophi): Secondary | ICD-10-CM

## 2019-10-13 DIAGNOSIS — Z5900 Homelessness unspecified: Secondary | ICD-10-CM

## 2019-10-13 DIAGNOSIS — I770 Arteriovenous fistula, acquired: Secondary | ICD-10-CM | POA: Diagnosis not present

## 2019-10-13 DIAGNOSIS — I10 Essential (primary) hypertension: Secondary | ICD-10-CM

## 2019-10-13 DIAGNOSIS — N184 Chronic kidney disease, stage 4 (severe): Secondary | ICD-10-CM | POA: Diagnosis not present

## 2019-10-13 DIAGNOSIS — B182 Chronic viral hepatitis C: Secondary | ICD-10-CM

## 2019-10-13 DIAGNOSIS — E039 Hypothyroidism, unspecified: Secondary | ICD-10-CM

## 2019-10-13 DIAGNOSIS — G4733 Obstructive sleep apnea (adult) (pediatric): Secondary | ICD-10-CM

## 2019-10-13 DIAGNOSIS — F329 Major depressive disorder, single episode, unspecified: Secondary | ICD-10-CM | POA: Diagnosis not present

## 2019-10-13 DIAGNOSIS — J432 Centrilobular emphysema: Secondary | ICD-10-CM

## 2019-10-13 DIAGNOSIS — I5032 Chronic diastolic (congestive) heart failure: Secondary | ICD-10-CM

## 2019-10-13 DIAGNOSIS — F419 Anxiety disorder, unspecified: Secondary | ICD-10-CM

## 2019-10-13 DIAGNOSIS — F32A Depression, unspecified: Secondary | ICD-10-CM

## 2019-10-13 MED ORDER — LEVOTHYROXINE SODIUM 100 MCG PO TABS: 100.0000 ug | ORAL_TABLET | Freq: Every day | ORAL | 3 refills | Status: DC

## 2019-10-13 MED ORDER — ALLOPURINOL 100 MG PO TABS: 200.0000 mg | ORAL_TABLET | Freq: Every day | ORAL | 5 refills | Status: DC

## 2019-10-13 MED ORDER — COLCHICINE 0.6 MG PO TABS: ORAL_TABLET | ORAL | 3 refills | Status: DC

## 2019-10-13 MED ORDER — LABETALOL HCL 200 MG PO TABS: 200.0000 mg | ORAL_TABLET | Freq: Three times a day (TID) | ORAL | 3 refills | Status: DC

## 2019-10-13 MED ORDER — 3 SERIES BP MONITOR/UPPER ARM DEVI: 1.0000 [IU] | Freq: Two times a day (BID) | 0 refills | Status: DC

## 2019-10-13 MED ORDER — ATORVASTATIN CALCIUM 40 MG PO TABS: 40.0000 mg | ORAL_TABLET | Freq: Every day | ORAL | 3 refills | Status: DC

## 2019-10-13 MED ORDER — FUROSEMIDE 20 MG PO TABS: 20.0000 mg | ORAL_TABLET | Freq: Every day | ORAL | 3 refills | Status: DC | PRN

## 2019-10-13 MED ORDER — ALBUTEROL SULFATE HFA 108 (90 BASE) MCG/ACT IN AERS: 1.0000 | INHALATION_SPRAY | Freq: Four times a day (QID) | RESPIRATORY_TRACT | 1 refills | Status: DC | PRN

## 2019-10-13 MED ORDER — DULERA 100-5 MCG/ACT IN AERO: 2.0000 | INHALATION_SPRAY | Freq: Two times a day (BID) | RESPIRATORY_TRACT | 2 refills | Status: DC

## 2019-10-13 MED ORDER — AMLODIPINE BESYLATE 10 MG PO TABS: 10.0000 mg | ORAL_TABLET | Freq: Every day | ORAL | 5 refills | Status: DC

## 2019-10-13 MED FILL — ATORVASTATIN CALCIUM 40 MG: 40 | 30 days supply | Qty: 30 | Fill #0

## 2019-10-13 MED FILL — FUROSEMIDE 20 MG TABS: 20 | 30 days supply | Qty: 30 | Fill #0

## 2019-10-13 MED FILL — MITIGARE 0.6 MG CAPSULE: 0.6 | 10 days supply | Qty: 30 | Fill #0

## 2019-10-13 NOTE — Assessment & Plan Note (Signed)
Refill on levothyroxine was also prescribed

## 2019-10-13 NOTE — Assessment & Plan Note (Signed)
Chronic diastolic heart failure appears to be stable at this time will refill amlodipine and furosemide along with labetalol

## 2019-10-13 NOTE — Assessment & Plan Note (Signed)
The patient now has Medicaid so I will attempt to get the patient back in with nephrology

## 2019-10-13 NOTE — Assessment & Plan Note (Signed)
As per heart failure assessment

## 2019-10-13 NOTE — Assessment & Plan Note (Signed)
The patient is using his CPAP machine nightly

## 2019-10-13 NOTE — Progress Notes (Signed)
Pt states the last time he had a gout flare up he had to take all his gout medicine in order the flare to go down

## 2019-10-13 NOTE — Telephone Encounter (Signed)
Call placed to patient at request of Dr Joya Gaskins.  The patient explained that he had to leave his apartment in [place] because he did not have his finances worked out.  He is now homeless and is staying with a friend in [place] but he is not sure how long he will be there.  He said that he contacted [facility] and they will not be able to take him in until 11/18/2019.   Provided him with the phone # for the [employer] re-entry program through [facility] and encouraged him to call.   He said that his disability application is still pending.

## 2019-10-13 NOTE — Assessment & Plan Note (Addendum)
Will refill albuterol and [facility]

## 2019-10-26 ENCOUNTER — Telehealth: Payer: Self-pay

## 2019-10-26 ENCOUNTER — Telehealth: Payer: Self-pay | Admitting: Pharmacist

## 2019-10-26 MED FILL — LABETALOL HCL 200 MG TABLET: 200 | 30 days supply | Qty: 90 | Fill #1

## 2019-10-26 MED FILL — AMLODIPINE BESYLATE 10 MG T: 10 | 90 days supply | Qty: 90 | Fill #0

## 2019-10-26 MED FILL — TRIAMCINOLONE ACETONIDE 0.1: 0.1 | 15 days supply | Qty: 45 | Fill #1

## 2019-10-26 MED FILL — LEVOTHYROXINE 100 MCG TAB: 100 | 30 days supply | Qty: 30 | Fill #3

## 2019-10-26 MED FILL — FUROSEMIDE 20 MG TABS: 20 | 30 days supply | Qty: 30 | Fill #1

## 2019-10-26 NOTE — Telephone Encounter (Signed)
Received request from pharmacy to change manufacturer of levothyroxine d/t availability. Pt has been informed to make follow-up for labs.

## 2019-10-26 NOTE — Telephone Encounter (Signed)
Message received from patient requesting a call back.   Call returned to patient # [phone] with call back to this CM requested

## 2019-11-16 ENCOUNTER — Telehealth: Payer: Self-pay

## 2019-11-16 NOTE — Telephone Encounter (Signed)
Call received from patient stating that he has a new address. He said that he is currently staying at a cousin's house.  The cousin is receiving  in -patient hospice care. He explained that his prior landlord will not allow him back into the apartment to get his belonging including his nebulizer, CPAP and medications.  He recently had some medications refilled and is not eligible for refill.  This CM explained to him that he has no medications ordered for a nebulizer, he has the albuterol inhaler. He did not report any difficulty breathing but said that he feels better if he has the inhalers and nebulizer.  He said that he has contacted [employer] and they advised him to come to their office to speak with them.  This CM strongly encouraged patient to contact Legal Aid and request assistance with obtaining his belongings.  This CM also provided the patient with the phone number for [place] to contact for additional assistance with retrieving his medications.

## 2019-11-20 ENCOUNTER — Telehealth: Payer: Self-pay | Admitting: Family Medicine

## 2019-11-20 NOTE — Telephone Encounter (Signed)
Will route to PCP for FYI.  Patient has OV on 11/25/19

## 2019-11-20 NOTE — Telephone Encounter (Signed)
Please advise to take medications as prescribed, rest prior to rechecking as anxiety can also cause further elevation, notify the clinic if still trending up.

## 2019-11-20 NOTE — Telephone Encounter (Signed)
Patient was concerned about bp. Patient took bp medications this am and checked his bp with his at home bp pump, 200s/180s. Patient stated that he was unaware if it was his pump of if the medication was not bringing his bp down yet. Patient stated bp currently read 189/103, patient stated he would wait to go to urgent care to get it checked, due to it coming down between now and the first time he checked his bp this am. Please fu at your earliest convenience.

## 2019-11-21 ENCOUNTER — Other Ambulatory Visit: Payer: Self-pay

## 2019-11-21 ENCOUNTER — Emergency Department (HOSPITAL_COMMUNITY)
Admission: EM | Admit: 2019-11-21 | Discharge: 2019-11-21 | Disposition: A | Discharge status: Home / Self Care | Payer: Medicaid Other | Attending: Emergency Medicine | Admitting: Emergency Medicine

## 2019-11-21 DIAGNOSIS — I130 Hypertensive heart and chronic kidney disease with heart failure and stage 1 through stage 4 chronic kidney disease, or unspecified chronic kidney disease: Secondary | ICD-10-CM | POA: Diagnosis not present

## 2019-11-21 DIAGNOSIS — N184 Chronic kidney disease, stage 4 (severe): Secondary | ICD-10-CM | POA: Insufficient documentation

## 2019-11-21 DIAGNOSIS — Z79899 Other long term (current) drug therapy: Secondary | ICD-10-CM | POA: Insufficient documentation

## 2019-11-21 DIAGNOSIS — Z87891 Personal history of nicotine dependence: Secondary | ICD-10-CM | POA: Insufficient documentation

## 2019-11-21 DIAGNOSIS — Z8679 Personal history of other diseases of the circulatory system: Secondary | ICD-10-CM

## 2019-11-21 DIAGNOSIS — J449 Chronic obstructive pulmonary disease, unspecified: Secondary | ICD-10-CM | POA: Insufficient documentation

## 2019-11-21 DIAGNOSIS — I5032 Chronic diastolic (congestive) heart failure: Secondary | ICD-10-CM | POA: Insufficient documentation

## 2019-11-21 DIAGNOSIS — E039 Hypothyroidism, unspecified: Secondary | ICD-10-CM | POA: Insufficient documentation

## 2019-11-21 DIAGNOSIS — I10 Essential (primary) hypertension: Secondary | ICD-10-CM | POA: Diagnosis not present

## 2019-11-21 NOTE — ED Triage Notes (Addendum)
Pt states that he has been taking his BP meds but hit blood pressure keeps rising. Denies CP and SOB. States his cuff may be messed up.

## 2019-11-21 NOTE — ED Notes (Signed)
Discharge instructions discussed with pt. Pt verbalized understanding. Pt stable and ambulatory. No signature pad available. 

## 2019-11-21 NOTE — ED Provider Notes (Signed)
[facility] EMERGENCY DEPARTMENT Provider Note  CSN: [id number] Arrival date & time: 11/21/19 0414  Chief Complaint(s) Hypertension  HPI Johnny [name] is a 59 y.o. male with a past medical history listed below including hypertension, chronic heart disease who presents to the emergency department with elevated blood pressures.  Patient believes his pressure cuff might be off.  Reports that he has been noticing elevated blood pressures and tonight noticed blood pressures with systolics in the 496P.  He states that he usually feels headaches with elevated blood pressures but has been asymptomatic without headache, chest pain, shortness of breath.  He was seen by community care clinic last month and prescribed his medication which he has been compliant with.  Patient denies any current physical complaints.  HPI  Past Medical History Past Medical History:  Diagnosis Date  . Adenomatous colon polyp    tubular  . Chronic kidney disease   . COPD (chronic obstructive pulmonary disease) ([place])   . Diverticulosis   . External otitis of right ear 11/10/2018  . Hepatitis C   . Hypertension   . OSA (obstructive sleep apnea) 11/03/2015   Patient Active Problem List   Diagnosis Date Noted  . Toenail fungus 06/11/2019  . Homelessness 06/11/2019  . Other atopic dermatitis 06/11/2019  . Hypothyroidism 06/11/2019  . Eczema of hand 11/10/2018  . 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 Chronic diastolic congestive heart failure ([place]) 11/10/2018  . Hyperlipidemia 11/10/2018  . Anxiety and depression 02/12/2018  . History of tobacco use disorder 12/07/2016  . Gout 12/07/2016  . COPD (chronic obstructive pulmonary disease) ([place]) 06/07/2016  . CKD (chronic kidney disease) stage 4, GFR 15-29 ml/min (HCC) 06/07/2016  . Essential hypertension 06/07/2016  . Chronic hepatitis C without hepatic coma ([place]) 03/21/2016  . OSA (obstructive sleep apnea) 11/03/2015   Home Medication(s) Prior to  Admission medications   Medication Sig Start Date End Date Taking? Authorizing Provider  albuterol (VENTOLIN HFA) 108 (90 Base) MCG/ACT inhaler Inhale 1-2 puffs into the lungs every 6 (six) hours as needed for wheezing or shortness of breath. 10/13/19   Elsie Stain, MD  allopurinol (ZYLOPRIM) 100 MG tablet Take 2 tablets (200 mg total) by mouth daily. 10/13/19   Elsie Stain, MD  amLODipine (NORVASC) 10 MG tablet Take 1 tablet (10 mg total) by mouth daily. 10/13/19   Elsie Stain, MD  atorvastatin (LIPITOR) 40 MG tablet Take 1 tablet (40 mg total) by mouth daily. 10/13/19   Elsie Stain, MD  Blood Pressure Monitoring (3 SERIES BP MONITOR/UPPER ARM) DEVI 1 Units by Does not apply route 2 (two) times daily. 10/13/19   Elsie Stain, MD  colchicine 0.6 MG tablet 2 tabs po x 1, then one tab po 1 hour later prn Gout flare 10/13/19   Elsie Stain, MD  furosemide (LASIX) 20 MG tablet Take 1 tablet (20 mg total) by mouth daily as needed for edema. 10/13/19   Elsie Stain, MD  labetalol (NORMODYNE) 200 MG tablet Take 1 tablet (200 mg total) by mouth 3 (three) times daily. 10/13/19   Elsie Stain, MD  levothyroxine (SYNTHROID) 100 MCG tablet Take 1 tablet (100 mcg total) by mouth daily. 10/13/19   Elsie Stain, MD  mometasone-formoterol (DULERA) 100-5 MCG/ACT AERO Inhale 2 puffs into the lungs 2 (two) times daily. 10/13/19   Elsie Stain, MD  triamcinolone cream (KENALOG) 0.1 % Apply 1 application topically 2 (two) times daily. 06/10/19  Elsie Stain, MD                                                                                                                                    Past Surgical History Past Surgical History:  Procedure Laterality Date  . AV FISTULA PLACEMENT  09/12/2009   Left arm AVF   Family History Family History  Problem Relation Age of Onset  . Hypertension Mother   . Diabetes Mother   . Colon cancer Neg Hx     Social  History Social History   Tobacco Use  . Smoking status: Former Smoker    Packs/day: 0.10    Types: Cigarettes    Quit date: 04/02/2011    Years since quitting: 8.6  . Smokeless tobacco: Never Used  . Tobacco comment: trying to quit  Substance Use Topics  . Alcohol use: Never    Alcohol/week: 0.0 standard drinks  . Drug use: No   Allergies Naproxen  Review of Systems Review of Systems All other systems are reviewed and are negative for acute change except as noted in the HPI  Physical Exam Vital Signs  I have reviewed the triage vital signs BP 123/80   Pulse 63   Temp 98.4 F (36.9 C) (Oral)   Resp 16   SpO2 96%   Physical Exam Vitals reviewed.  Constitutional:      General: He is not in acute distress.    Appearance: He is well-developed. He is not diaphoretic.  HENT:     Head: Normocephalic and atraumatic.     Nose: Nose normal.  Eyes:     General: No scleral icterus.       Right eye: No discharge.        Left eye: No discharge.     Conjunctiva/sclera: Conjunctivae normal.     Pupils: Pupils are equal, round, and reactive to light.  Cardiovascular:     Rate and Rhythm: Normal rate and regular rhythm.     Heart sounds: No murmur. No friction rub. No gallop.   Pulmonary:     Effort: Pulmonary effort is normal. No respiratory distress.     Breath sounds: Normal breath sounds. No stridor. No rales.  Abdominal:     General: There is no distension.     Palpations: Abdomen is soft.     Tenderness: There is no abdominal tenderness.  Musculoskeletal:        General: No tenderness.     Cervical back: Normal range of motion and neck supple.  Skin:    General: Skin is warm and dry.     Findings: No erythema or rash.  Neurological:     Mental Status: He is alert and oriented to person, place, and time.     ED Results and Treatments Labs (all labs ordered are listed, but only abnormal results are displayed) Labs Reviewed - No data to display  EKG  EKG Interpretation  Date/Time:    Ventricular Rate:    PR Interval:    QRS Duration:   QT Interval:    QTC Calculation:   R Axis:     Text Interpretation:        Radiology No results found.  Pertinent labs & imaging results that were available during my care of the patient were reviewed by me and considered in my medical decision making (see chart for details).  Medications Ordered in ED Medications - No data to display                                                                                                                                  Procedures Procedures  (including critical care time)  Medical Decision Making / ED Course I have reviewed the nursing notes for this encounter and the patient's prior records (if available in EHR or on provided paperwork).   Johnny [name] was evaluated in Emergency Department on 11/21/2019 for the symptoms described in the history of present illness. He was evaluated in the context of the global COVID-19 pandemic, which necessitated consideration that the patient might be at risk for infection with the SARS-CoV-2 virus that causes COVID-19. Institutional protocols and algorithms that pertain to the evaluation of patients at risk for COVID-19 are in a state of rapid change based on information released by regulatory bodies including the CDC and federal and state organizations. These policies and algorithms were followed during the patient's care in the ED.  Patient does not have hypertension at this time and is currently asymptomatic.  No work-up necessary.  The patient appears reasonably screened and/or stabilized for discharge and I doubt any other medical condition or other [facility] requiring further screening, evaluation, or treatment in the ED at this time prior to discharge.  The patient is safe for discharge with strict return  precautions.       Final Clinical Impression(s) / ED Diagnoses Final diagnoses:  H/O: HTN (hypertension)    The patient appears reasonably screened and/or stabilized for discharge and I doubt any other medical condition or other [facility] requiring further screening, evaluation, or treatment in the ED at this time prior to discharge.  Disposition: Discharge  Condition: Good    ED Discharge Orders    None        Follow Up: Charlott Rakes, MD [place] [id number] [phone]  Schedule an appointment as soon as possible for a visit  As needed      This chart was dictated using voice recognition software.  Despite best efforts to proofread,  errors can occur which can change the documentation meaning.   Fatima Blank, MD 11/21/19 [phone]

## 2019-11-25 ENCOUNTER — Encounter: Payer: Self-pay | Admitting: Family Medicine

## 2019-11-25 ENCOUNTER — Ambulatory Visit: Payer: Medicaid Other | Attending: Family Medicine | Admitting: Family Medicine

## 2019-11-25 ENCOUNTER — Other Ambulatory Visit: Payer: Self-pay

## 2019-11-25 DIAGNOSIS — K0889 Other specified disorders of teeth and supporting structures: Secondary | ICD-10-CM

## 2019-11-25 DIAGNOSIS — I10 Essential (primary) hypertension: Secondary | ICD-10-CM

## 2019-11-25 DIAGNOSIS — H527 Unspecified disorder of refraction: Secondary | ICD-10-CM

## 2019-11-25 DIAGNOSIS — M1A00X0 Idiopathic chronic gout, unspecified site, without tophus (tophi): Secondary | ICD-10-CM | POA: Diagnosis not present

## 2019-11-25 DIAGNOSIS — B351 Tinea unguium: Secondary | ICD-10-CM

## 2019-11-25 MED ORDER — MISC. DEVICES MISC: 0 refills | Status: DC

## 2019-11-25 MED FILL — MITIGARE 0.6 MG CAPSULE: 0.6 | 10 days supply | Qty: 30 | Fill #0

## 2019-11-25 NOTE — Progress Notes (Signed)
Patient has been called and DOB has been verified. Patient has been screened and transferred to PCP to start phone visit.   Gout flare in foot.

## 2019-11-25 NOTE — Progress Notes (Signed)
Virtual Visit via Telephone Note  I connected with [name], on 11/25/2019 at 9:17 AM by telephone due to the COVID-19 pandemic and verified that I am speaking with the correct person using two identifiers.   Consent: I discussed the limitations, risks, security and privacy concerns of performing an evaluation and management service by telephone and the availability of in person appointments. I also discussed with the patient that there may be a patient responsible charge related to this service. The patient expressed understanding and agreed to proceed.   Location of Patient: Home  Location of Provider: Clinic   Persons participating in Telemedicine visit: Dekota Shenk Farrington-CMA Dr. Margarita Rana     History of Present Illness: Johnny [name] is a 59 year old male with a history of COPD, Gout, stage IV chronic kidney disease, hypertension, sleep apnea who presents today for a follow-up visit.  He needs a Dental referral for dental cleaning; also needs Ophthalmology referral as his vision has worsened and he is having to obtain glasses from the [employer] store He has fungal infection in his toenails with associated thickening I would like to see a podiatrist.  His Left foot is painful with a Gout flare x3 days and is an 8/10 he is in the process of obtaining his Colchicine from the pharmacy.  Has been avoiding foods that trigger gout including beef and is unsure of the trigger of his acute flare.  His BP was elevated and he had an ED visit which revealed a BP of 123/80 and also revealed he had a problem with his BP monitor. He never made it to [facility] for his anxiety but promises to do so now that he has Medicaid transportation. He has no additional concerns today. At his last visit last month with Dr. Joya Gaskins his chronic medical conditions were addressed and refills received.  Past Medical History:  Diagnosis Date  . Adenomatous colon polyp    tubular  . Chronic kidney  disease   . COPD (chronic obstructive pulmonary disease) ([place])   . Diverticulosis   . External otitis of right ear 11/10/2018  . Hepatitis C   . Hypertension   . OSA (obstructive sleep apnea) 11/03/2015   Allergies  Allergen Reactions  . Naproxen Other (See Comments)    Unknown    Current Outpatient Medications on File Prior to Visit  Medication Sig Dispense Refill  . albuterol (VENTOLIN HFA) 108 (90 Base) MCG/ACT inhaler Inhale 1-2 puffs into the lungs every 6 (six) hours as needed for wheezing or shortness of breath. 18 g 1  . allopurinol (ZYLOPRIM) 100 MG tablet Take 2 tablets (200 mg total) by mouth daily. 60 tablet 5  . amLODipine (NORVASC) 10 MG tablet Take 1 tablet (10 mg total) by mouth daily. 30 tablet 5  . atorvastatin (LIPITOR) 40 MG tablet Take 1 tablet (40 mg total) by mouth daily. 30 tablet 3  . Blood Pressure Monitoring (3 SERIES BP MONITOR/UPPER ARM) DEVI 1 Units by Does not apply route 2 (two) times daily. 1 each 0  . colchicine 0.6 MG tablet 2 tabs po x 1, then one tab po 1 hour later prn Gout flare 30 tablet 3  . furosemide (LASIX) 20 MG tablet Take 1 tablet (20 mg total) by mouth daily as needed for edema. 30 tablet 3  . labetalol (NORMODYNE) 200 MG tablet Take 1 tablet (200 mg total) by mouth 3 (three) times daily. 90 tablet 3  . levothyroxine (SYNTHROID) 100 MCG tablet Take  1 tablet (100 mcg total) by mouth daily. 30 tablet 3  . mometasone-formoterol (DULERA) 100-5 MCG/ACT AERO Inhale 2 puffs into the lungs 2 (two) times daily. 13 g 2  . triamcinolone cream (KENALOG) 0.1 % Apply 1 application topically 2 (two) times daily. 45 g 1   No current facility-administered medications on file prior to visit.    Observations/Objective: Awake, alert, oriented x3 Not in acute distress  CMP Latest Ref Rng & Units 06/10/2019 04/10/2019 11/10/2018  Glucose 65 - 99 mg/dL 79 103(H) 92  BUN 6 - 24 mg/dL 45(H) 40(H) 29(H)  Creatinine 0.76 - 1.27 mg/dL 3.41(H) 4.26(H) 3.37(H)  Sodium  134 - 144 mmol/L 140 142 140  Potassium 3.5 - 5.2 mmol/L 4.0 4.2 4.3  Chloride 96 - 106 mmol/L 101 108(H) 102  CO2 20 - 29 mmol/L 24 18(L) 24  Calcium 8.7 - 10.2 mg/dL 9.4 8.7 9.4  Total Protein 6.0 - 8.5 g/dL 7.6 - 7.2  Total Bilirubin 0.0 - 1.2 mg/dL 0.5 - 1.0  Alkaline Phos 39 - 117 IU/L 70 - 80  AST 0 - 40 IU/L 48(H) - 36  ALT 0 - 44 IU/L 42 - 27     Lab Results  Component Value Date   TSH 5.280 (H) 06/10/2019    Assessment and Plan: 1. Pain, dental - Ambulatory referral to Dentistry  2. Refraction error - Ambulatory referral to Ophthalmology  3. Onychomycosis - Ambulatory referral to Podiatry  4. Essential hypertension Controlled Continue amlodipine, labetalol Counseled on blood pressure goal of less than 130/80, low-sodium, DASH diet, medication compliance, 150 minutes of moderate intensity exercise per week. Discussed medication compliance, adverse effects. - Misc. Devices MISC; Blood Pressure monitor. Dx: HTN  Dispense: 1 each; Refill: 0  5. Idiopathic chronic gout without tophus, unspecified site Acute flare Chronic diuretic use also contributory Encouraged to continue to adhere to a low purine eating plan Pick up colchicine from pharmacy   Follow Up Instructions: Return in about 3 months (around 02/23/2020) for Chronic care management, fasting labs, thyroid labs.    I discussed the assessment and treatment plan with the patient. The patient was provided an opportunity to ask questions and all were answered. The patient agreed with the plan and demonstrated an understanding of the instructions.   The patient was advised to call back or seek an in-person evaluation if the symptoms worsen or if the condition fails to improve as anticipated.     I provided 23 minutes total of non-face-to-face time during this encounter including median intraservice time, reviewing previous notes, investigations, ordering medications, medical decision making, coordinating care  and patient verbalized understanding at the end of the visit.     Charlott Rakes, MD, FAAFP. [facility] [place], [place]   11/25/2019, 9:17 AM

## 2019-11-30 DIAGNOSIS — I10 Essential (primary) hypertension: Secondary | ICD-10-CM | POA: Diagnosis not present

## 2019-12-01 ENCOUNTER — Other Ambulatory Visit: Payer: Self-pay

## 2019-12-01 ENCOUNTER — Encounter: Payer: Self-pay | Admitting: Family Medicine

## 2019-12-01 ENCOUNTER — Ambulatory Visit: Payer: Medicaid Other | Attending: Family Medicine | Admitting: Family Medicine

## 2019-12-01 DIAGNOSIS — N184 Chronic kidney disease, stage 4 (severe): Secondary | ICD-10-CM | POA: Diagnosis not present

## 2019-12-01 DIAGNOSIS — M25561 Pain in right knee: Secondary | ICD-10-CM

## 2019-12-01 DIAGNOSIS — E039 Hypothyroidism, unspecified: Secondary | ICD-10-CM | POA: Diagnosis not present

## 2019-12-01 DIAGNOSIS — G8929 Other chronic pain: Secondary | ICD-10-CM | POA: Diagnosis not present

## 2019-12-01 DIAGNOSIS — Z125 Encounter for screening for malignant neoplasm of prostate: Secondary | ICD-10-CM

## 2019-12-01 MED ORDER — PREDNISONE 20 MG PO TABS: 20.0000 mg | ORAL_TABLET | Freq: Every day | ORAL | 0 refills | Status: DC

## 2019-12-01 MED FILL — predniSONE 20 MG TABS: 20 | 5 days supply | Qty: 5 | Fill #0

## 2019-12-01 NOTE — Progress Notes (Signed)
Patient has been called and DOB has been verified. Patient has been screened and transferred to PCP to start phone visit.  Pain in right  knee x 1 week needs referral placed for kidney specialist.

## 2019-12-01 NOTE — Progress Notes (Signed)
Virtual Visit via Telephone Note  I connected with Johnny [name], on 12/01/2019 at 10:01 AM by telephone due to the COVID-19 pandemic and verified that I am speaking with the correct person using two identifiers.   Consent: I discussed the limitations, risks, security and privacy concerns of performing an evaluation and management service by telephone and the availability of in person appointments. I also discussed with the patient that there may be a patient responsible charge related to this service. The patient expressed understanding and agreed to proceed.   Location of Patient: Home  Location of Provider: Clinic   Persons participating in Telemedicine visit: Raphael E Serafin Alicia Farrington-CMA Dr. Newlin     History of Present Illness: Johnny Gordonis a 58-year-old male with a history of COPD, Gout, stageIVchronic kidney disease, hypertension, sleep apneawho presents today for a follow-up visit.   He needs a referral to nephrology but review of his chart indicates referral was placed in 10/13/2019 by Dr. Wright and he was supposed to call to schedule an appointment.  His R knee is painful and feels like "it is twisted" but he denies trauma. It is swollen and he placed an ACE wrap on it with slight improvement in symptoms. Would like his prostate checked. He is concerned about fatigue which occurs on little exertion.  Past Medical History:  Diagnosis Date  . Adenomatous colon polyp    tubular  . Chronic kidney disease   . COPD (chronic obstructive pulmonary disease) (HCC)   . Diverticulosis   . External otitis of right ear 11/10/2018  . Hepatitis C   . Hypertension   . OSA (obstructive sleep apnea) 11/03/2015   Allergies  Allergen Reactions  . Naproxen Other (See Comments)    Unknown    Current Outpatient Medications on File Prior to Visit  Medication Sig Dispense Refill  . albuterol (VENTOLIN HFA) 108 (90 Base) MCG/ACT inhaler Inhale 1-2 puffs into the lungs  every 6 (six) hours as needed for wheezing or shortness of breath. 18 g 1  . allopurinol (ZYLOPRIM) 100 MG tablet Take 2 tablets (200 mg total) by mouth daily. 60 tablet 5  . amLODipine (NORVASC) 10 MG tablet Take 1 tablet (10 mg total) by mouth daily. 30 tablet 5  . atorvastatin (LIPITOR) 40 MG tablet Take 1 tablet (40 mg total) by mouth daily. 30 tablet 3  . Blood Pressure Monitoring (3 SERIES BP MONITOR/UPPER ARM) DEVI 1 Units by Does not apply route 2 (two) times daily. 1 each 0  . colchicine 0.6 MG tablet 2 tabs po x 1, then one tab po 1 hour later prn Gout flare 30 tablet 3  . furosemide (LASIX) 20 MG tablet Take 1 tablet (20 mg total) by mouth daily as needed for edema. 30 tablet 3  . labetalol (NORMODYNE) 200 MG tablet Take 1 tablet (200 mg total) by mouth 3 (three) times daily. 90 tablet 3  . levothyroxine (SYNTHROID) 100 MCG tablet Take 1 tablet (100 mcg total) by mouth daily. 30 tablet 3  . Misc. Devices MISC Blood Pressure monitor. Dx: HTN 1 each 0  . mometasone-formoterol (DULERA) 100-5 MCG/ACT AERO Inhale 2 puffs into the lungs 2 (two) times daily. 13 g 2  . triamcinolone cream (KENALOG) 0.1 % Apply 1 application topically 2 (two) times daily. 45 g 1   No current facility-administered medications on file prior to visit.    Observations/Objective: Awake, alert, oriented x3 Not in acute distress  Assessment and Plan: 1. Chronic   pain of right knee We will check uric acid level given underlying history of gout Short course of prednisone - predniSONE (DELTASONE) 20 MG tablet; Take 1 tablet (20 mg total) by mouth daily with breakfast.  Dispense: 5 tablet; Refill: 0 - VITAMIN D 25 Hydroxy (Vit-D Deficiency, Fractures); Future  2. Acquired hypothyroidism Currently on levothyroxine He is due for thyroid labs - T4, free; Future - TSH; Future  3. Screening for prostate cancer - PSA, total and free; Future  4. CKD (chronic kidney disease) stage 4, GFR 15-29 ml/min  (HCC) Referred to nephrology He has been advised to call [facility] for an appointment - Lipid panel; Future - CMP14+EGFR; Future - Ambulatory referral to Nephrology   Follow Up Instructions: Return in about 3 months (around 02/29/2020).    I discussed the assessment and treatment plan with the patient. The patient was provided an opportunity to ask questions and all were answered. The patient agreed with the plan and demonstrated an understanding of the instructions.   The patient was advised to call back or seek an in-person evaluation if the symptoms worsen or if the condition fails to improve as anticipated.     I provided 15 minutes total of non-face-to-face time during this encounter including median intraservice time, reviewing previous notes, investigations, ordering medications, medical decision making, coordinating care and patient verbalized understanding at the end of the visit.     Enobong Newlin, MD, FAAFP. [facility] [place], [place] [phone]   12/01/2019, 10:01 AM  

## 2019-12-04 ENCOUNTER — Other Ambulatory Visit: Payer: Self-pay

## 2019-12-04 ENCOUNTER — Ambulatory Visit (HOSPITAL_COMMUNITY)
Admission: EM | Admit: 2019-12-04 | Discharge: 2019-12-04 | Disposition: A | Discharge status: Home / Self Care | Payer: Medicaid Other | Attending: Physician Assistant | Admitting: Physician Assistant

## 2019-12-04 ENCOUNTER — Encounter (HOSPITAL_COMMUNITY): Payer: Self-pay

## 2019-12-04 DIAGNOSIS — Z113 Encounter for screening for infections with a predominantly sexual mode of transmission: Secondary | ICD-10-CM | POA: Diagnosis not present

## 2019-12-04 LAB — HIV ANTIBODY (ROUTINE TESTING W REFLEX): HIV Screen 4th Generation wRfx: NONREACTIVE

## 2019-12-04 NOTE — Discharge Instructions (Addendum)
We have sent your lab work out and will notify you of any positive results.  Negative results can be found in your Mychart.  Always practice safe sex with barrier methods such as condoms.

## 2019-12-04 NOTE — ED Provider Notes (Signed)
[place]    CSN: [id number] Arrival date & time: 12/04/19  1442      History   Chief Complaint Chief Complaint  Patient presents with  . Exposure to STD    HPI Johnny [name] is a 59 y.o. male.   Patient reports to urgent care today for STD testing. He denies exposure. He denies painful urination, discharge or skin lesion. He has one sexual partner and did not use condoms.      Past Medical History:  Diagnosis Date  . Adenomatous colon polyp    tubular  . Chronic kidney disease   . COPD (chronic obstructive pulmonary disease) ([place])   . Diverticulosis   . External otitis of right ear 11/10/2018  . Hepatitis C   . Hypertension   . OSA (obstructive sleep apnea) 11/03/2015    Patient Active Problem List   Diagnosis Date Noted  . Toenail fungus 06/11/2019  . Homelessness 06/11/2019  . Other atopic dermatitis 06/11/2019  . Hypothyroidism 06/11/2019  . Eczema of hand 11/10/2018  . 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 Chronic diastolic congestive heart failure ([place]) 11/10/2018  . Hyperlipidemia 11/10/2018  . Anxiety and depression 02/12/2018  . History of tobacco use disorder 12/07/2016  . Gout 12/07/2016  . COPD (chronic obstructive pulmonary disease) ([place]) 06/07/2016  . CKD (chronic kidney disease) stage 4, GFR 15-29 ml/min (HCC) 06/07/2016  . Essential hypertension 06/07/2016  . OSA (obstructive sleep apnea) 11/03/2015    Past Surgical History:  Procedure Laterality Date  . AV FISTULA PLACEMENT  09/12/2009   Left arm AVF       Home Medications    Prior to Admission medications   Medication Sig Start Date End Date Taking? Authorizing Provider  albuterol (VENTOLIN HFA) 108 (90 Base) MCG/ACT inhaler Inhale 1-2 puffs into the lungs every 6 (six) hours as needed for wheezing or shortness of breath. 10/13/19   Elsie Stain, MD  allopurinol (ZYLOPRIM) 100 MG tablet Take 2 tablets (200 mg total) by mouth daily. 10/13/19   Elsie Stain, MD    amLODipine (NORVASC) 10 MG tablet Take 1 tablet (10 mg total) by mouth daily. 10/13/19   Elsie Stain, MD  atorvastatin (LIPITOR) 40 MG tablet Take 1 tablet (40 mg total) by mouth daily. 10/13/19   Elsie Stain, MD  Blood Pressure Monitoring (3 SERIES BP MONITOR/UPPER ARM) DEVI 1 Units by Does not apply route 2 (two) times daily. 10/13/19   Elsie Stain, MD  colchicine 0.6 MG tablet 2 tabs po x 1, then one tab po 1 hour later prn Gout flare 10/13/19   Elsie Stain, MD  furosemide (LASIX) 20 MG tablet Take 1 tablet (20 mg total) by mouth daily as needed for edema. 10/13/19   Elsie Stain, MD  labetalol (NORMODYNE) 200 MG tablet Take 1 tablet (200 mg total) by mouth 3 (three) times daily. 10/13/19   Elsie Stain, MD  levothyroxine (SYNTHROID) 100 MCG tablet Take 1 tablet (100 mcg total) by mouth daily. 10/13/19   Elsie Stain, MD  Misc. Devices MISC Blood Pressure monitor. Dx: HTN 11/25/19   Charlott Rakes, MD  mometasone-formoterol (DULERA) 100-5 MCG/ACT AERO Inhale 2 puffs into the lungs 2 (two) times daily. 10/13/19   Elsie Stain, MD  predniSONE (DELTASONE) 20 MG tablet Take 1 tablet (20 mg total) by mouth daily with breakfast. 12/01/19   Charlott Rakes, MD  triamcinolone cream (KENALOG) 0.1 % Apply 1 application topically  2 (two) times daily. 06/10/19   Elsie Stain, MD    Family History Family History  Problem Relation Age of Onset  . Hypertension Mother   . Diabetes Mother   . Colon cancer Neg Hx     Social History Social History   Tobacco Use  . Smoking status: Former Smoker    Packs/day: 0.10    Types: Cigarettes    Quit date: 04/02/2011    Years since quitting: 8.6  . Smokeless tobacco: Never Used  . Tobacco comment: trying to quit  Substance Use Topics  . Alcohol use: Never    Alcohol/week: 0.0 standard drinks  . Drug use: No     Allergies   Naproxen   Review of Systems Review of Systems  Constitutional: Negative for chills and  fever.  Genitourinary: Negative for discharge, dysuria, penile pain, penile swelling, scrotal swelling, testicular pain and urgency.  Skin: Negative for color change and rash.     Physical Exam Triage Vital Signs ED Triage Vitals  Enc Vitals Group     BP 12/04/19 1524 (!) 152/94     Pulse Rate 12/04/19 1524 95     Resp 12/04/19 1524 16     Temp 12/04/19 1524 98.3 F (36.8 C)     Temp Source 12/04/19 1524 Oral     SpO2 12/04/19 1524 99 %     Weight --      Height --      Head Circumference --      Peak Flow --      Pain Score 12/04/19 1523 0     Pain Loc --      Pain Edu? --      Excl. in [place]? --    No data found.  Updated Vital Signs BP (!) 152/94 (BP Location: Right Arm)   Pulse 95   Temp 98.3 F (36.8 C) (Oral)   Resp 16   SpO2 99%   Visual Acuity Right Eye Distance:   Left Eye Distance:   Bilateral Distance:    Right Eye Near:   Left Eye Near:    Bilateral Near:     Physical Exam Vitals and nursing note reviewed.  Constitutional:      General: He is not in acute distress.    Appearance: Normal appearance. He is well-developed. He is not ill-appearing.  HENT:     Head: Normocephalic and atraumatic.  Eyes:     General: No scleral icterus.    Conjunctiva/sclera: Conjunctivae normal.  Cardiovascular:     Rate and Rhythm: Normal rate.  Pulmonary:     Effort: Pulmonary effort is normal. No respiratory distress.  Genitourinary:    Penis: Normal.      Testes: Normal.  Musculoskeletal:        General: Normal range of motion.     Cervical back: Neck supple.     Right lower leg: No edema.     Left lower leg: No edema.  Skin:    General: Skin is warm and dry.  Neurological:     General: No focal deficit present.     Mental Status: He is alert and oriented to person, place, and time.  Psychiatric:        Mood and Affect: Mood normal.        Behavior: Behavior normal.        Thought Content: Thought content normal.        Judgment: Judgment normal.       UC  Treatments / Results  Labs (all labs ordered are listed, but only abnormal results are displayed) Labs Reviewed  HIV ANTIBODY (ROUTINE TESTING W REFLEX)  RPR  CYTOLOGY, (ORAL, ANAL, URETHRAL) ANCILLARY ONLY    EKG   Radiology No results found.  Procedures Procedures (including critical care time)  Medications Ordered in UC Medications - No data to display  Initial Impression / Assessment and Plan / UC Course  I have reviewed the triage vital signs and the nursing notes.  Pertinent labs & imaging results that were available during my care of the patient were reviewed by me and considered in my medical decision making (see chart for details).     #STD screen - no symptoms. Swab sent for GC/CT/Trich, HIV and RPR sent.   Final Clinical Impressions(s) / UC Diagnoses   Final diagnoses:  Screen for STD (sexually transmitted disease)     Discharge Instructions     We have sent your lab work out and will notify you of any positive results.  Negative results can be found in your Mychart.  Always practice safe sex with barrier methods such as condoms.     ED Prescriptions    None     PDMP not reviewed this encounter.   Purnell Shoemaker, PA-C 12/04/19 1543

## 2019-12-04 NOTE — ED Triage Notes (Signed)
Patient presents to Urgent Care with complaints of possible std exposure since 2-3 weeks ago. Patient reports he is not having any sx of infection.

## 2019-12-05 LAB — RPR: RPR Ser Ql: NONREACTIVE

## 2019-12-07 ENCOUNTER — Other Ambulatory Visit: Payer: Self-pay

## 2019-12-07 ENCOUNTER — Ambulatory Visit (INDEPENDENT_AMBULATORY_CARE_PROVIDER_SITE_OTHER): Payer: Medicaid Other | Admitting: Podiatry

## 2019-12-07 DIAGNOSIS — M79676 Pain in unspecified toe(s): Secondary | ICD-10-CM

## 2019-12-07 DIAGNOSIS — B351 Tinea unguium: Secondary | ICD-10-CM

## 2019-12-07 LAB — CYTOLOGY, (ORAL, ANAL, URETHRAL) ANCILLARY ONLY
Chlamydia: NEGATIVE
Neisseria Gonorrhea: NEGATIVE
Trichomonas: NEGATIVE

## 2019-12-07 LAB — HEPATIC FUNCTION PANEL
AG Ratio: 1.1 (calc) (ref 1.0–2.5)
ALT: 34 U/L (ref 9–46)
AST: 35 U/L (ref 10–35)
Albumin: 3.9 g/dL (ref 3.6–5.1)
Alkaline phosphatase (APISO): 78 U/L (ref 35–144)
Bilirubin, Direct: 0.2 mg/dL (ref 0.0–0.2)
Globulin: 3.7 g/dL (calc) (ref 1.9–3.7)
Indirect Bilirubin: 0.4 mg/dL (calc) (ref 0.2–1.2)
Total Bilirubin: 0.6 mg/dL (ref 0.2–1.2)
Total Protein: 7.6 g/dL (ref 6.1–8.1)

## 2019-12-09 ENCOUNTER — Other Ambulatory Visit: Payer: Self-pay | Admitting: Critical Care Medicine

## 2019-12-09 MED FILL — ALLOPURINOL 100 MG TABLET: 100 | 30 days supply | Qty: 60 | Fill #0

## 2019-12-10 MED FILL — LEVOTHYROXINE SODIUM 100 MC: 100 | 30 days supply | Qty: 30 | Fill #0

## 2019-12-10 NOTE — Progress Notes (Signed)
   SUBJECTIVE Patient presents to office today complaining of elongated, thickened nails that cause pain while ambulating in shoes. He is unable to trim his own nails. Patient is here for further evaluation and treatment.  Past Medical History:  Diagnosis Date  . Adenomatous colon polyp    tubular  . Chronic kidney disease   . COPD (chronic obstructive pulmonary disease) ([place])   . Diverticulosis   . External otitis of right ear 11/10/2018  . Hepatitis C   . Hypertension   . OSA (obstructive sleep apnea) 11/03/2015    OBJECTIVE General Patient is awake, alert, and oriented x 3 and in no acute distress. Derm Skin is dry and supple bilateral. Negative open lesions or macerations. Remaining integument unremarkable. Nails are tender, long, thickened and dystrophic with subungual debris, consistent with onychomycosis, 1-5 bilateral. No signs of infection noted. Vasc  DP and PT pedal pulses palpable bilaterally. Temperature gradient within normal limits.  Neuro Epicritic and protective threshold sensation grossly intact bilaterally.  Musculoskeletal Exam No symptomatic pedal deformities noted bilateral. Muscular strength within normal limits.  ASSESSMENT 1. Onychodystrophic nails 1-5 bilateral with hyperkeratosis of nails.  2. Onychomycosis of nail due to dermatophyte bilateral 3. Pain in foot bilateral  PLAN OF CARE 1. Patient evaluated today.  2. Instructed to maintain good pedal hygiene and foot care.  3. Mechanical debridement of nails 1-5 bilaterally performed using a nail nipper. Filed with dremel without incident.  4. LFT ordered. If normal, our office will prescribe Lamisil 250 mg #90 to patient.  5. Return to clinic as needed.    Edrick Kins, DPM [facility]  Dr. Edrick Kins, [place]                                        [place], [place] [address]                Office [phone]  Fax [phone]

## 2019-12-17 MED FILL — predniSONE 20 MG TABS: 20 | 5 days supply | Qty: 5 | Fill #0

## 2019-12-23 MED FILL — ALLOPURINOL 100 MG TABLET: 100 | 30 days supply | Qty: 60 | Fill #0

## 2019-12-23 MED FILL — LEVOTHYROXINE SODIUM 100 MC: 100 | 30 days supply | Qty: 30 | Fill #0

## 2019-12-23 MED FILL — MITIGARE 0.6 MG CAPSULE: 0.6 | 10 days supply | Qty: 30 | Fill #1

## 2019-12-24 ENCOUNTER — Ambulatory Visit: Payer: Medicaid Other | Attending: Family Medicine | Admitting: Family Medicine

## 2019-12-24 ENCOUNTER — Other Ambulatory Visit: Payer: Self-pay | Admitting: Pharmacist

## 2019-12-24 ENCOUNTER — Other Ambulatory Visit: Payer: Self-pay | Admitting: Family Medicine

## 2019-12-24 ENCOUNTER — Other Ambulatory Visit: Payer: Self-pay

## 2019-12-24 ENCOUNTER — Encounter: Payer: Self-pay | Admitting: Family Medicine

## 2019-12-24 DIAGNOSIS — M25561 Pain in right knee: Secondary | ICD-10-CM

## 2019-12-24 DIAGNOSIS — M1A00X0 Idiopathic chronic gout, unspecified site, without tophus (tophi): Secondary | ICD-10-CM

## 2019-12-24 DIAGNOSIS — J432 Centrilobular emphysema: Secondary | ICD-10-CM

## 2019-12-24 MED ORDER — IPRATROPIUM-ALBUTEROL 0.5-2.5 (3) MG/3ML IN SOLN: 3.0000 mL | Freq: Four times a day (QID) | RESPIRATORY_TRACT | 2 refills | Status: DC | PRN

## 2019-12-24 MED ORDER — IPRATROPIUM-ALBUTEROL 0.5-2.5 (3) MG/3ML IN SOLN: 3.0000 mL | Freq: Four times a day (QID) | RESPIRATORY_TRACT | 1 refills | Status: DC | PRN

## 2019-12-24 MED ORDER — PREDNISONE 20 MG PO TABS: 20.0000 mg | ORAL_TABLET | Freq: Every day | ORAL | 0 refills | Status: DC

## 2019-12-24 MED FILL — IPRAT-ALBUT 0.5-3(2.5) MG/3: 0.5-2.5 (3) | 7 days supply | Qty: 90 | Fill #0

## 2019-12-24 MED FILL — predniSONE 20 MG TABS: 20 | 5 days supply | Qty: 5 | Fill #0

## 2019-12-24 NOTE — Progress Notes (Signed)
Patient has been called and DOB has been verified. Patient has been screened and transferred to PCP to start phone visit.  Patient is requesting a nebulizer machine.  Gout flare in right knee.

## 2019-12-24 NOTE — Progress Notes (Signed)
Virtual Visit via Telephone Note  I connected with Johnny [name], on 12/24/2019 at 10:03 AM by telephone due to the COVID-19 pandemic and verified that I am speaking with the correct person using two identifiers.   Consent: I discussed the limitations, risks, security and privacy concerns of performing an evaluation and management service by telephone and the availability of in person appointments. I also discussed with the patient that there may be a patient responsible charge related to this service. The patient expressed understanding and agreed to proceed.   Location of Patient: Home  Location of Provider: Clinic   Persons participating in Telemedicine visit: Khyron Garno Farrington-CMA Dr. Margarita Rana     History of Present Illness: Johnny [name] a 59 year old male with a history of COPD,Gout, stageIVchronic kidney disease, hypertension, sleep apneawhopresents today for a follow-up visit.   Complains of R knee Gout flare for one week with associated pain, swelling. Last treated for a Gout flare on 12/01/19. I had placed him on Allopurinol and Colchicine which he never picked up due financial constraints; he also never picked up Prednisone. He would like a prescription for a nebulizer machine and nebulizer solution for his COPD.  Past Medical History:  Diagnosis Date  . Adenomatous colon polyp    tubular  . Chronic kidney disease   . COPD (chronic obstructive pulmonary disease) ([place])   . Diverticulosis   . External otitis of right ear 11/10/2018  . Hepatitis C   . Hypertension   . OSA (obstructive sleep apnea) 11/03/2015   Allergies  Allergen Reactions  . Naproxen Other (See Comments)    Unknown    Current Outpatient Medications on File Prior to Visit  Medication Sig Dispense Refill  . albuterol (VENTOLIN HFA) 108 (90 Base) MCG/ACT inhaler Inhale 1-2 puffs into the lungs every 6 (six) hours as needed for wheezing or shortness of breath. 18 g 1  .  allopurinol (ZYLOPRIM) 100 MG tablet Take 2 tablets (200 mg total) by mouth daily. 60 tablet 5  . amLODipine (NORVASC) 10 MG tablet Take 1 tablet (10 mg total) by mouth daily. 30 tablet 5  . atorvastatin (LIPITOR) 40 MG tablet Take 1 tablet (40 mg total) by mouth daily. 30 tablet 3  . Blood Pressure Monitoring (3 SERIES BP MONITOR/UPPER ARM) DEVI 1 Units by Does not apply route 2 (two) times daily. 1 each 0  . colchicine 0.6 MG tablet 2 tabs po x 1, then one tab po 1 hour later prn Gout flare 30 tablet 3  . furosemide (LASIX) 20 MG tablet Take 1 tablet (20 mg total) by mouth daily as needed for edema. 30 tablet 3  . labetalol (NORMODYNE) 200 MG tablet Take 1 tablet (200 mg total) by mouth 3 (three) times daily. 90 tablet 3  . levothyroxine (SYNTHROID) 100 MCG tablet TAKE 1 TABLET (100 MCG TOTAL) BY MOUTH DAILY. 30 tablet 3  . Misc. Devices MISC Blood Pressure monitor. Dx: HTN 1 each 0  . mometasone-formoterol (DULERA) 100-5 MCG/ACT AERO Inhale 2 puffs into the lungs 2 (two) times daily. 13 g 2  . predniSONE (DELTASONE) 20 MG tablet Take 1 tablet (20 mg total) by mouth daily with breakfast. 5 tablet 0  . triamcinolone cream (KENALOG) 0.1 % Apply 1 application topically 2 (two) times daily. 45 g 1   No current facility-administered medications on file prior to visit.    Observations/Objective: Awake, alert, oriented x3 Not in acute distress  Assessment and Plan: 1. Acute  pain of right knee Secondary to non adherence with prophylactic regimen Counseled on low purine eating plan - predniSONE (DELTASONE) 20 MG tablet; Take 1 tablet (20 mg total) by mouth daily with breakfast.  Dispense: 5 tablet; Refill: 0  2. Idiopathic chronic gout without tophus, unspecified site Advised to pick up Allopuriol from Pharmacy  3. Centrilobular emphysema ([place]) Prescribed Duoneb He will complete paper work for nebulizer machine when he comes into the office   Follow Up Instructions: Keep previously  schedule appointment.   I discussed the assessment and treatment plan with the patient. The patient was provided an opportunity to ask questions and all were answered. The patient agreed with the plan and demonstrated an understanding of the instructions.   The patient was advised to call back or seek an in-person evaluation if the symptoms worsen or if the condition fails to improve as anticipated.     I provided 11 minutes total of non-face-to-face time during this encounter including median intraservice time, reviewing previous notes, investigations, ordering medications, medical decision making, coordinating care and patient verbalized understanding at the end of the visit.     Charlott Rakes, MD, FAAFP. [facility] and [place], [place]   12/24/2019, 10:03 AM

## 2020-01-06 ENCOUNTER — Other Ambulatory Visit: Payer: Self-pay | Admitting: Pharmacist

## 2020-01-06 ENCOUNTER — Telehealth: Payer: Self-pay | Admitting: Family Medicine

## 2020-01-06 DIAGNOSIS — J449 Chronic obstructive pulmonary disease, unspecified: Secondary | ICD-10-CM | POA: Diagnosis not present

## 2020-01-06 MED ORDER — IPRATROPIUM-ALBUTEROL 0.5-2.5 (3) MG/3ML IN SOLN: 3.0000 mL | Freq: Four times a day (QID) | RESPIRATORY_TRACT | 2 refills | Status: DC | PRN

## 2020-01-06 NOTE — Telephone Encounter (Signed)
Pt came in and asked for nebulizer

## 2020-01-06 NOTE — Telephone Encounter (Signed)
Patient was given nebulizer machine.

## 2020-02-01 MED FILL — AMLODIPINE BESYLATE 10 MG T: 10 | 90 days supply | Qty: 90 | Fill #1

## 2020-02-01 MED FILL — MITIGARE 0.6 MG CAPSULE: 0.6 | 10 days supply | Qty: 30 | Fill #1

## 2020-02-17 DIAGNOSIS — H1045 Other chronic allergic conjunctivitis: Secondary | ICD-10-CM | POA: Diagnosis not present

## 2020-02-17 DIAGNOSIS — H524 Presbyopia: Secondary | ICD-10-CM | POA: Diagnosis not present

## 2020-02-17 DIAGNOSIS — H2513 Age-related nuclear cataract, bilateral: Secondary | ICD-10-CM | POA: Diagnosis not present

## 2020-02-17 DIAGNOSIS — H04123 Dry eye syndrome of bilateral lacrimal glands: Secondary | ICD-10-CM | POA: Diagnosis not present

## 2020-03-01 ENCOUNTER — Other Ambulatory Visit: Payer: Self-pay | Admitting: Critical Care Medicine

## 2020-03-01 DIAGNOSIS — I10 Essential (primary) hypertension: Secondary | ICD-10-CM

## 2020-05-10 ENCOUNTER — Other Ambulatory Visit: Payer: Self-pay | Admitting: Family Medicine

## 2020-05-10 DIAGNOSIS — I10 Essential (primary) hypertension: Secondary | ICD-10-CM

## 2020-05-10 MED ORDER — AMLODIPINE BESYLATE 10 MG PO TABS: 10.0000 mg | ORAL_TABLET | Freq: Every day | ORAL | 1 refills | Status: DC

## 2020-05-10 MED FILL — AMLODIPINE BESYLATE 10 MG T: 10 | 30 days supply | Qty: 30 | Fill #0

## 2020-05-10 NOTE — Telephone Encounter (Signed)
Patient requesting amLODipine (NORVASC) 10 MG tablet, informed patient please allow 48 to 72 hour turn around time   [place], [place] [employer] Phone:  [phone]  Fax:  [phone]

## 2020-06-03 ENCOUNTER — Other Ambulatory Visit: Payer: Self-pay | Admitting: Family Medicine

## 2020-06-03 DIAGNOSIS — I10 Essential (primary) hypertension: Secondary | ICD-10-CM

## 2020-07-15 ENCOUNTER — Other Ambulatory Visit: Payer: Self-pay | Admitting: Family Medicine

## 2020-07-15 MED ORDER — IPRATROPIUM-ALBUTEROL 0.5-2.5 (3) MG/3ML IN SOLN: 3.0000 mL | Freq: Four times a day (QID) | RESPIRATORY_TRACT | 1 refills | Status: DC | PRN

## 2020-07-15 NOTE — Telephone Encounter (Signed)
Medication Refill - Medication: ipratropium- albuterol   Has the patient contacted their pharmacy? Yes.    (Agent: If no, request that the patient contact the pharmacy for the refill.) (Agent: If yes, when and what did the pharmacy advise?)  Preferred Pharmacy (with phone number or street name):  [place] ([place]), [place] - [address]  [address] [place]) [place] [address]  Phone: [phone] Fax: [phone]  Hours: Not open 24 hours     Agent: Please be advised that RX refills may take up to 3 business days. We ask that you follow-up with your pharmacy.

## 2020-07-21 ENCOUNTER — Telehealth: Payer: Self-pay | Admitting: Family Medicine

## 2020-07-21 NOTE — Telephone Encounter (Signed)
Please make patient an office visit with any available provider.

## 2020-07-21 NOTE — Telephone Encounter (Signed)
Copied from [place] [id number]. Topic: General - Other >> Jul 21, 2020  3:23 PM Alanda Slim E wrote: Reason for CRM: Pt is having back pain and can barley move and feels it could be his siatic nerve/ Pt asked for a call from Dr. Margarita Rana today please

## 2020-07-27 ENCOUNTER — Other Ambulatory Visit: Payer: Self-pay | Admitting: Critical Care Medicine

## 2020-07-27 DIAGNOSIS — I10 Essential (primary) hypertension: Secondary | ICD-10-CM

## 2020-07-27 NOTE — Telephone Encounter (Signed)
Courtesy refill. Pt has appt with Dr. Joya Gaskins 08/29/20

## 2020-08-08 ENCOUNTER — Other Ambulatory Visit: Payer: Self-pay | Admitting: Family Medicine

## 2020-08-08 ENCOUNTER — Telehealth: Payer: Self-pay | Admitting: Family Medicine

## 2020-08-08 MED ORDER — ALBUTEROL SULFATE HFA 108 (90 BASE) MCG/ACT IN AERS: 1.0000 | INHALATION_SPRAY | Freq: Four times a day (QID) | RESPIRATORY_TRACT | 1 refills | Status: DC | PRN

## 2020-08-08 NOTE — Telephone Encounter (Signed)
Call returned to patient.  He said that he was never able to obtain his belongings from his prior residence.  The landlord would not permit him back.  He said he now needs a new CPAP machine.  The machine he had was  from the [employer] /CPAP assistance program.  He now has insurance and medicaid will require a recent sleep study.  His last study was in 2016.  Scheduled him appointment with Dr Margarita Rana 08/24/2020.   He said that he is currently living at his cousin's house.  She died and her niece's are now caring for the house and he needs to find a place to live. He has contacted the [employer] and applied for section 8 housing.

## 2020-08-08 NOTE — Telephone Encounter (Signed)
Requested Prescriptions  Pending Prescriptions Disp Refills  . albuterol (VENTOLIN HFA) 108 (90 Base) MCG/ACT inhaler 18 g 1    Sig: Inhale 1-2 puffs into the lungs every 6 (six) hours as needed for wheezing or shortness of breath.     Pulmonology:  Beta Agonists Failed - 08/08/2020 11:28 AM      Failed - One inhaler should last at least one month. If the patient is requesting refills earlier, contact the patient to check for uncontrolled symptoms.      Passed - Valid encounter within last 12 months    Recent Outpatient Visits          7 months ago Centrilobular emphysema ([place])   [place], Charlane Ferretti, MD   8 months ago Chronic pain of right knee   [place], En[place]ong, MD   8 months ago Pain, dental   [place], Charlane Ferretti, MD   10 months ago Chronic diastolic congestive heart failure [facility])   [place] Elsie Stain, MD   11 months ago Essential hypertension   [place], MD      Future Appointments            In 3 weeks [name], NP [place]

## 2020-08-08 NOTE — Telephone Encounter (Signed)
Patient is calling to speak to Jan. Please advise Cb- [phone]

## 2020-08-08 NOTE — Telephone Encounter (Signed)
Medication: albuterol (VENTOLIN HFA) 108 (90 Base) MCG/ACT inhaler [[id number]]   Has the patient contacted their pharmacy? YES (Agent: If no, request that the patient contact the pharmacy for the refill.) (Agent: If yes, when and what did the pharmacy advise?)  Preferred Pharmacy (with phone number or street name): [place] ([place]), [place] - [address]  ([place]) [place] [address] Phone: [phone] Fax: [phone] Hours: Not open 24 hours    Agent: Please be advised that RX refills may take up to 3 business days. We ask that you follow-up with your pharmacy.

## 2020-08-19 ENCOUNTER — Other Ambulatory Visit: Payer: Self-pay | Admitting: Family Medicine

## 2020-08-19 DIAGNOSIS — I10 Essential (primary) hypertension: Secondary | ICD-10-CM

## 2020-08-20 NOTE — Telephone Encounter (Signed)
  Notes to clinic Has appt in 4 days but has already had curtesy refill, please assess.

## 2020-08-22 ENCOUNTER — Other Ambulatory Visit: Payer: Self-pay | Admitting: Critical Care Medicine

## 2020-08-22 DIAGNOSIS — I10 Essential (primary) hypertension: Secondary | ICD-10-CM

## 2020-08-22 NOTE — Telephone Encounter (Signed)
Patient has appointment in 2 days- #30 refilled

## 2020-08-22 NOTE — Telephone Encounter (Signed)
Pt calling in regarding this medication and is requesting to have it sent in to the pharmacy today. Please advise.

## 2020-08-23 ENCOUNTER — Ambulatory Visit: Payer: Medicaid Other | Attending: Internal Medicine

## 2020-08-23 DIAGNOSIS — Z23 Encounter for immunization: Secondary | ICD-10-CM

## 2020-08-23 NOTE — Progress Notes (Signed)
   Covid-19 Vaccination Clinic  Name:  Johnny [name]    MRN: [id number] DOB: [birth year]  08/23/2020  Johnny [name] was observed post Covid-19 immunization for 30 minutes based on pre-vaccination screening without incident. He was provided with Vaccine Information Sheet and instruction to access the V-Safe system.   Johnny [name] was instructed to call 911 with any severe reactions post vaccine: [name] Kitchen Difficulty breathing  . Swelling of face and throat  . A fast heartbeat  . A bad rash all over body  . Dizziness and weakness   Immunizations Administered    Name Date Dose VIS Date Route   JANSSEN COVID-19 VACCINE 08/23/2020  2:19 PM 0.5 mL 01/02/2020 Intramuscular   Manufacturer: Alphonsa [employer]   Lot: [id number]   [place]: [id number]

## 2020-08-24 ENCOUNTER — Ambulatory Visit: Payer: Medicaid Other | Admitting: Family Medicine

## 2020-08-29 ENCOUNTER — Ambulatory Visit: Payer: Medicaid Other | Admitting: Nurse Practitioner

## 2020-10-08 ENCOUNTER — Other Ambulatory Visit: Payer: Self-pay | Admitting: Critical Care Medicine

## 2020-10-08 ENCOUNTER — Other Ambulatory Visit: Payer: Self-pay | Admitting: Family Medicine

## 2020-10-08 DIAGNOSIS — I10 Essential (primary) hypertension: Secondary | ICD-10-CM

## 2020-10-08 NOTE — Telephone Encounter (Signed)
Requested medication (s) are due for refill today: yes  Requested medication (s) are on the active medication list: yes  Last refill:  08/22/20  Future visit scheduled: no  Notes to clinic:  called pt and unable to LM on Vm   Requested Prescriptions  Pending Prescriptions Disp Refills   labetalol (NORMODYNE) 200 MG tablet [Pharmacy Med Name: Labetalol HCl 200 MG Oral Tablet] 90 tablet 0    Sig: TAKE 1 TABLET BY MOUTH THREE TIMES DAILY . APPOINTMENT REQUIRED FOR FUTURE REFILLS      Cardiovascular:  Beta Blockers Failed - 10/08/2020 10:16 AM      Failed - Last BP in normal range    BP Readings from Last 1 Encounters:  12/04/19 (!) 152/94          Failed - Valid encounter within last 6 months    Recent Outpatient Visits           9 months ago Centrilobular emphysema ([place])   [place], Charlane Ferretti, MD   10 months ago Chronic pain of right knee   [place], Enobong, MD   10 months ago Pain, dental   [place], Charlane Ferretti, MD   12 months ago Chronic diastolic congestive heart failure [facility])   [place] [facility] Elsie Stain, MD   1 year ago Essential hypertension   [place], MD              Passed - Last Heart Rate in normal range    Pulse Readings from Last 1 Encounters:  12/04/19 95

## 2020-10-08 NOTE — Telephone Encounter (Signed)
Requested medication (s) are due for refill today: yes  Requested medication (s) are on the active medication list: yes  Last refill:  08/22/20 Courtesy RF  Future visit scheduled: no  Notes to clinic:  called pt and unable to LM    Requested Prescriptions  Pending Prescriptions Disp Refills   amLODipine (NORVASC) 10 MG tablet [Pharmacy Med Name: amLODIPine Besylate 10 MG Oral Tablet] 30 tablet 0    Sig: Take 1 tablet by mouth once daily      Cardiovascular:  Calcium Channel Blockers Failed - 10/08/2020 10:16 AM      Failed - Last BP in normal range    BP Readings from Last 1 Encounters:  12/04/19 (!) 152/94          Failed - Valid encounter within last 6 months    Recent Outpatient Visits           9 months ago Centrilobular emphysema ([place])   [place], Charlane Ferretti, MD   10 months ago Chronic pain of right knee   [place], Enobong, MD   10 months ago Pain, dental   [place], Charlane Ferretti, MD   12 months ago Chronic diastolic congestive heart failure [facility])   [place] [facility] Elsie Stain, MD   1 year ago Essential hypertension   [place] Elsie Stain, MD

## 2020-10-12 ENCOUNTER — Telehealth: Payer: Self-pay | Admitting: Family Medicine

## 2020-10-12 DIAGNOSIS — I10 Essential (primary) hypertension: Secondary | ICD-10-CM

## 2020-10-12 NOTE — Telephone Encounter (Signed)
Patient called and stated that he is completely out of his medication and would like the doctor to send him some to his pharmacy to hold him out until his appointment on 11-08-2020. Please advise

## 2020-10-12 NOTE — Telephone Encounter (Signed)
Copied from [place] [phone]. Topic: General - Other >> Oct 12, 2020  8:55 AM Leward Quan A wrote: Reason for CRM: Patient called in because he is completely out of his medication labetalol (NORMODYNE) 200 MG tablet  and would like refills to help until his appointment please. Asking for a call back at   Ph# [phone]

## 2020-10-13 ENCOUNTER — Ambulatory Visit: Payer: Self-pay

## 2020-10-13 MED ORDER — LABETALOL HCL 200 MG PO TABS: ORAL_TABLET | ORAL | 0 refills | Status: DC

## 2020-10-13 NOTE — Telephone Encounter (Signed)
Pt. Reports he has been out of his blood pressure medicine x 3 days.Has a headache. Offered appointment, declines. "I just need my medicine refilled." Please advise pt.  Answer Assessment - Initial Assessment Questions 1. BLOOD PRESSURE: "What is the blood pressure?" "Did you take at least two measurements 5 minutes apart?"     No 2. ONSET: "When did you take your blood pressure?"     This week 3. HOW: "How did you obtain the blood pressure?" (e.g., visiting nurse, automatic home BP monitor)     n/a 4. HISTORY: "Do you have a history of high blood pressure?"     Yes 5. MEDICATIONS: "Are you taking any medications for blood pressure?" "Have you missed any doses recently?"     Yes 6. OTHER SYMPTOMS: "Do you have any symptoms?" (e.g., headache, chest pain, blurred vision, difficulty breathing, weakness)     Headache, hot 7. PREGNANCY: "Is there any chance you are pregnant?" "When was your last menstrual period?"     No  Protocols used: BLOOD PRESSURE - HIGH-A-AH

## 2020-10-13 NOTE — Telephone Encounter (Signed)
Rx sent 

## 2020-10-13 NOTE — Telephone Encounter (Signed)
Please place pt on any available provider schedule, Cari can also see this pt if she has any openings.

## 2020-10-17 ENCOUNTER — Ambulatory Visit: Payer: Medicaid Other | Admitting: Physician Assistant

## 2020-11-07 ENCOUNTER — Other Ambulatory Visit: Payer: Self-pay | Admitting: Critical Care Medicine

## 2020-11-07 ENCOUNTER — Other Ambulatory Visit: Payer: Self-pay | Admitting: Family Medicine

## 2020-11-07 DIAGNOSIS — I10 Essential (primary) hypertension: Secondary | ICD-10-CM

## 2020-11-07 DIAGNOSIS — L2089 Other atopic dermatitis: Secondary | ICD-10-CM

## 2020-11-07 NOTE — Telephone Encounter (Signed)
Appointment 11/09/19- Rx refilled.

## 2020-11-07 NOTE — Telephone Encounter (Signed)
Patient requesting eczema cream (unsure of the name) patient states he is out and would like request expedited.    [place] ([place]), [place] - [address] Phone:  [phone]  Fax:  [phone]

## 2020-11-07 NOTE — Telephone Encounter (Signed)
Requested medication (s) are due for refill today - expired Rx  Requested medication (s) are on the active medication list -yes  Future visit scheduled -yes  Last refill: 06/10/19  Notes to clinic: Request RF of expired Rx- patient has appointment tomorrow  Requested Prescriptions  Pending Prescriptions Disp Refills   triamcinolone (KENALOG) 0.1 % 45 g 1    Sig: Apply 1 application topically 2 (two) times daily.      Dermatology:  Corticosteroids Passed - 11/07/2020 12:19 PM      Passed - Valid encounter within last 12 months    Recent Outpatient Visits           10 months ago Centrilobular emphysema ([place])   [place], Charlane Ferretti, MD   11 months ago Chronic pain of right knee   [place], Enobong, MD   11 months ago Pain, dental   [place], Enobong, MD   1 year ago Chronic diastolic congestive heart failure [facility])   [place], Patrick E, MD   1 year ago Essential hypertension   [place] Elsie Stain, MD       Future Appointments             Tomorrow Charlott Rakes, MD [place]                 Requested Prescriptions  Pending Prescriptions Disp Refills   triamcinolone (KENALOG) 0.1 % 45 g 1    Sig: Apply 1 application topically 2 (two) times daily.      Dermatology:  Corticosteroids Passed - 11/07/2020 12:19 PM      Passed - Valid encounter within last 12 months    Recent Outpatient Visits           10 months ago Centrilobular emphysema ([place])   [place], Enobong, MD   11 months ago Chronic pain of right knee   [place], Enobong, MD   11 months ago Pain, dental   [place], Enobong, MD   1 year ago Chronic diastolic  congestive heart failure [facility])   [place] [facility] Elsie Stain, MD   1 year ago Essential hypertension   [place] Elsie Stain, MD       Future Appointments             Tomorrow Charlott Rakes, MD [place]

## 2020-11-08 ENCOUNTER — Ambulatory Visit: Payer: Medicaid Other | Attending: Family Medicine | Admitting: Physician Assistant

## 2020-11-08 ENCOUNTER — Encounter: Payer: Self-pay | Admitting: Physician Assistant

## 2020-11-08 ENCOUNTER — Other Ambulatory Visit: Payer: Self-pay

## 2020-11-08 ENCOUNTER — Other Ambulatory Visit (HOSPITAL_COMMUNITY)
Admission: RE | Admit: 2020-11-08 | Discharge: 2020-11-08 | Disposition: A | Discharge status: Home / Self Care | Payer: Medicaid Other | Source: Ambulatory Visit | Attending: Family Medicine | Admitting: Family Medicine

## 2020-11-08 VITALS — BP 118/67 | HR 83 | Ht 74.0 in | Wt 202.4 lb

## 2020-11-08 DIAGNOSIS — E7800 Pure hypercholesterolemia, unspecified: Secondary | ICD-10-CM | POA: Diagnosis not present

## 2020-11-08 DIAGNOSIS — I10 Essential (primary) hypertension: Secondary | ICD-10-CM | POA: Diagnosis not present

## 2020-11-08 DIAGNOSIS — N184 Chronic kidney disease, stage 4 (severe): Secondary | ICD-10-CM

## 2020-11-08 DIAGNOSIS — J432 Centrilobular emphysema: Secondary | ICD-10-CM

## 2020-11-08 DIAGNOSIS — E039 Hypothyroidism, unspecified: Secondary | ICD-10-CM | POA: Diagnosis not present

## 2020-11-08 DIAGNOSIS — Z113 Encounter for screening for infections with a predominantly sexual mode of transmission: Secondary | ICD-10-CM | POA: Insufficient documentation

## 2020-11-08 DIAGNOSIS — M1A00X0 Idiopathic chronic gout, unspecified site, without tophus (tophi): Secondary | ICD-10-CM | POA: Diagnosis not present

## 2020-11-08 MED ORDER — LABETALOL HCL 200 MG PO TABS
ORAL_TABLET | ORAL | 3 refills | Status: DC
Start: 2020-11-08 — End: 2021-01-16

## 2020-11-08 MED ORDER — COLCHICINE 0.6 MG PO TABS: ORAL_TABLET | ORAL | 3 refills | Status: DC

## 2020-11-08 MED ORDER — LEVOTHYROXINE SODIUM 100 MCG PO TABS: 100.0000 ug | ORAL_TABLET | Freq: Every day | ORAL | 3 refills | Status: DC

## 2020-11-08 MED ORDER — ATORVASTATIN CALCIUM 40 MG PO TABS: 40.0000 mg | ORAL_TABLET | Freq: Every day | ORAL | 3 refills | Status: DC

## 2020-11-08 MED ORDER — FUROSEMIDE 20 MG PO TABS: 20.0000 mg | ORAL_TABLET | Freq: Every day | ORAL | 3 refills | Status: DC | PRN

## 2020-11-08 MED ORDER — AMLODIPINE BESYLATE 10 MG PO TABS: 10.0000 mg | ORAL_TABLET | Freq: Every day | ORAL | 3 refills | Status: DC

## 2020-11-08 MED ORDER — ALLOPURINOL 100 MG PO TABS: 200.0000 mg | ORAL_TABLET | Freq: Every day | ORAL | 5 refills | Status: DC

## 2020-11-08 MED ORDER — DULERA 100-5 MCG/ACT IN AERO: 2.0000 | INHALATION_SPRAY | Freq: Two times a day (BID) | RESPIRATORY_TRACT | 3 refills | Status: DC

## 2020-11-08 MED ORDER — ALBUTEROL SULFATE HFA 108 (90 BASE) MCG/ACT IN AERS: 1.0000 | INHALATION_SPRAY | Freq: Four times a day (QID) | RESPIRATORY_TRACT | 1 refills | Status: DC | PRN

## 2020-11-08 NOTE — Progress Notes (Signed)
Patient ID: WYMON SWANEY, male   DOB: 1961/09/20, 60 y.o.   MRN: [id number]   Muhsin Doris, is a 60 y.o. male  NFA:[id number]  ION:[id number]  DOB - June 08, 1961  Subjective:  Chief Complaint and HPI: Johnny [name] is a 60 y.o. male here today with several issues.  Out of thyroid meds for about 2 months.  He wasn't sure if it was "important or not."  He is compliant with other meds.    He says he needs to get STD testing.  He had unprotected sex over the holidays.  Refused doing a self-swab.  No penile discharge.     ROS:   Constitutional:  No f/c, No night sweats, No unexplained weight loss. EENT:  No vision changes, No blurry vision, No hearing changes. No mouth, throat, or ear problems.  Respiratory: No cough, No SOB Cardiac: No CP, no palpitations GI:  No abd pain, No N/V/D. GU: No Urinary s/sx Musculoskeletal: No joint pain Neuro: No headache, no dizziness, no motor weakness.  Skin: No rash Endocrine:  No polydipsia. No polyuria.  Psych: Denies SI/HI  No problems updated.  ALLERGIES: Allergies  Allergen Reactions  . Naproxen Other (See Comments)    Unknown    PAST MEDICAL HISTORY: Past Medical History:  Diagnosis Date  . Adenomatous colon polyp    tubular  . Chronic kidney disease   . COPD (chronic obstructive pulmonary disease) ([place])   . Diverticulosis   . External otitis of right ear 11/10/2018  . Hepatitis C   . Hypertension   . OSA (obstructive sleep apnea) 11/03/2015    MEDICATIONS AT HOME: Prior to Admission medications   Medication Sig Start Date End Date Taking? Authorizing Provider  Blood Pressure Monitoring (3 SERIES BP MONITOR/UPPER ARM) DEVI 1 Units by Does not apply route 2 (two) times daily. 10/13/19  Yes Elsie Stain, MD  ipratropium-albuterol (DUONEB) 0.5-2.5 (3) MG/3ML SOLN Take 3 mLs by nebulization every 6 (six) hours as needed. 07/15/20  Yes Charlott Rakes, MD  Misc. Devices MISC Blood Pressure monitor. Dx: HTN 11/25/19  Yes [place], Enobong,  MD  triamcinolone cream (KENALOG) 0.1 % Apply 1 application topically 2 (two) times daily. 06/10/19  Yes Elsie Stain, MD  albuterol (VENTOLIN HFA) 108 (90 Base) MCG/ACT inhaler Inhale 1-2 puffs into the lungs every 6 (six) hours as needed for wheezing or shortness of breath. 11/08/20   Argentina Donovan, PA-C  allopurinol (ZYLOPRIM) 100 MG tablet Take 2 tablets (200 mg total) by mouth daily. 11/08/20   Argentina Donovan, PA-C  amLODipine (NORVASC) 10 MG tablet Take 1 tablet (10 mg total) by mouth daily. 11/08/20   Argentina Donovan, PA-C  atorvastatin (LIPITOR) 40 MG tablet Take 1 tablet (40 mg total) by mouth daily. 11/08/20   Argentina Donovan, PA-C  colchicine 0.6 MG tablet 2 tabs po x 1, then one tab po 1 hour later prn Gout flare 11/08/20   Argentina Donovan, PA-C  furosemide (LASIX) 20 MG tablet Take 1 tablet (20 mg total) by mouth daily as needed for edema. 11/08/20   Argentina Donovan, PA-C  labetalol (NORMODYNE) 200 MG tablet TAKE 1 TABLET BY MOUTH THREE TIMES DAILY 11/08/20   Argentina Donovan, PA-C  levothyroxine (SYNTHROID) 100 MCG tablet Take 1 tablet (100 mcg total) by mouth daily. 11/08/20   Argentina Donovan, PA-C  mometasone-formoterol (DULERA) 100-5 MCG/ACT AERO Inhale 2 puffs into the lungs 2 (two) times daily. 11/08/20   Freeman Caldron  M, PA-C     Objective:  EXAM:   Vitals:   11/08/20 1617  BP: 118/67  Pulse: 83  SpO2: 98%  Weight: 202 lb 6.4 oz (91.8 kg)  Height: 6\' 2"  (1.88 m)    General appearance : A&OX3. NAD. Non-toxic-appearing HEENT: Atraumatic and Normocephalic.  PERRLA. EOM intact. Neck: supple, no JVD. No cervical lymphadenopathy. No thyromegaly Chest/Lungs:  Breathing-non-labored, Good air entry bilaterally, breath sounds normal without rales, rhonchi, or wheezing  CVS: S1 S2 regular, no murmurs, gallops, rubs  Extremities: Bilateral Lower Ext shows no edema, both legs are warm to touch with = pulse throughout Neurology:  CN II-XII grossly intact, Non focal.    Psych:  TP linear. J/I fair. Normal speech. Appropriate eye contact and affect.  Skin:  No Rash  Data Review Lab Results  Component Value Date   HGBA1C  02/21/2009    5.3 (NOTE) The ADA recommends the following therapeutic goal for glycemic control related to Hgb A1c measurement: Goal of therapy: <6.5 Hgb A1c  Reference: American Diabetes Association: Clinical Practice Recommendations 2010, Diabetes Care, 2010, 33: (Suppl  1).     Assessment & Plan   1. Idiopathic chronic gout without tophus, unspecified site - allopurinol (ZYLOPRIM) 100 MG tablet; Take 2 tablets (200 mg total) by mouth daily.  Dispense: 60 tablet; Refill: 5 - colchicine 0.6 MG tablet; 2 tabs po x 1, then one tab po 1 hour later prn Gout flare  Dispense: 30 tablet; Refill: 3  2. CKD (chronic kidney disease) stage 4, GFR 15-29 ml/min (HCC) - furosemide (LASIX) 20 MG tablet; Take 1 tablet (20 mg total) by mouth daily as needed for edema.  Dispense: 30 tablet; Refill: 3  3. Essential hypertension Controlled- - Comprehensive metabolic panel - CBC with Differential/Platelet - amLODipine (NORVASC) 10 MG tablet; Take 1 tablet (10 mg total) by mouth daily.  Dispense: 30 tablet; Refill: 3 - labetalol (NORMODYNE) 200 MG tablet; TAKE 1 TABLET BY MOUTH THREE TIMES DAILY  Dispense: 90 tablet; Refill: 3  4. Centrilobular emphysema ([place])  - mometasone-formoterol (DULERA) 100-5 MCG/ACT AERO; Inhale 2 puffs into the lungs 2 (two) times daily.  Dispense: 13 g; Refill: 3 - albuterol (VENTOLIN HFA) 108 (90 Base) MCG/ACT inhaler; Inhale 1-2 puffs into the lungs every 6 (six) hours as needed for wheezing or shortness of breath.  Dispense: 18 g; Refill: 1  5. Acquired hypothyroidism Resume synthroid.  Patient education discussed on thyroid disorders and the importance of the medication - levothyroxine (SYNTHROID) 100 MCG tablet; Take 1 tablet (100 mcg total) by mouth daily.  Dispense: 30 tablet; Refill: 3 - Comprehensive metabolic  panel - Thyroid Panel With TSH  6. Screening examination for STD (sexually transmitted disease) Safe sex practices always - Urine cytology ancillary only - HIV antibody (with reflex) - RPR  7. Pure hypercholesterolemia - atorvastatin (LIPITOR) 40 MG tablet; Take 1 tablet (40 mg total) by mouth daily.  Dispense: 30 tablet; Refill: 3   Patient have been counseled extensively about nutrition and exercise  Return in about 3 months (around 02/06/2021) for see PCP;  chronic conditions.  The patient was given clear instructions to go to ER or return to medical center if symptoms don't improve, worsen or new problems develop. The patient verbalized understanding. The patient was told to call to get lab results if they haven't heard anything in the next week.     Freeman Caldron, PA-C [facility] [place], [place]  11/08/2020, 4:55 PM

## 2020-11-08 NOTE — Progress Notes (Signed)
Has not had thyroid medication in 2 months.  Requesting refills on medication on file.

## 2020-11-09 ENCOUNTER — Other Ambulatory Visit: Payer: Self-pay | Admitting: Physician Assistant

## 2020-11-09 DIAGNOSIS — N184 Chronic kidney disease, stage 4 (severe): Secondary | ICD-10-CM

## 2020-11-09 LAB — THYROID PANEL WITH TSH
Free Thyroxine Index: 1.7 (ref 1.2–4.9)
T3 Uptake Ratio: 24 % (ref 24–39)
T4, Total: 7.2 ug/dL (ref 4.5–12.0)
TSH: 3.39 u[IU]/mL (ref 0.450–4.500)

## 2020-11-09 LAB — COMPREHENSIVE METABOLIC PANEL
ALT: 37 IU/L (ref 0–44)
AST: 50 IU/L — ABNORMAL HIGH (ref 0–40)
Albumin/Globulin Ratio: 1.1 — ABNORMAL LOW (ref 1.2–2.2)
Albumin: 3.9 g/dL (ref 3.8–4.9)
Alkaline Phosphatase: 81 IU/L (ref 44–121)
BUN/Creatinine Ratio: 8 — ABNORMAL LOW (ref 9–20)
BUN: 42 mg/dL — ABNORMAL HIGH (ref 6–24)
Bilirubin Total: 0.3 mg/dL (ref 0.0–1.2)
CO2: 24 mmol/L (ref 20–29)
Calcium: 8.6 mg/dL — ABNORMAL LOW (ref 8.7–10.2)
Chloride: 98 mmol/L (ref 96–106)
Creatinine, Ser: 5.39 mg/dL — ABNORMAL HIGH (ref 0.76–1.27)
GFR calc Af Amer: 13 mL/min/{1.73_m2} — ABNORMAL LOW (ref >59)
GFR calc non Af Amer: 11 mL/min/{1.73_m2} — ABNORMAL LOW (ref >59)
Globulin, Total: 3.6 g/dL (ref 1.5–4.5)
Glucose: 85 mg/dL (ref 65–99)
Potassium: 4.7 mmol/L (ref 3.5–5.2)
Sodium: 135 mmol/L (ref 134–144)
Total Protein: 7.5 g/dL (ref 6.0–8.5)

## 2020-11-09 LAB — CBC WITH DIFFERENTIAL/PLATELET
Basophils Absolute: 0.1 10*3/uL (ref 0.0–0.2)
Basos: 1 %
EOS (ABSOLUTE): 0.4 10*3/uL (ref 0.0–0.4)
Eos: 7 %
Hematocrit: 41.7 % (ref 37.5–51.0)
Hemoglobin: 14.2 g/dL (ref 13.0–17.7)
Immature Grans (Abs): 0 10*3/uL (ref 0.0–0.1)
Immature Granulocytes: 0 %
Lymphocytes Absolute: 2.5 10*3/uL (ref 0.7–3.1)
Lymphs: 41 %
MCH: 26.3 pg — ABNORMAL LOW (ref 26.6–33.0)
MCHC: 34.1 g/dL (ref 31.5–35.7)
MCV: 77 fL — ABNORMAL LOW (ref 79–97)
Monocytes Absolute: 1.1 10*3/uL — ABNORMAL HIGH (ref 0.1–0.9)
Monocytes: 18 %
Neutrophils Absolute: 2 10*3/uL (ref 1.4–7.0)
Neutrophils: 33 %
Platelets: 183 10*3/uL (ref 150–450)
RBC: 5.39 x10E6/uL (ref 4.14–5.80)
RDW: 15.8 % — ABNORMAL HIGH (ref 11.6–15.4)
WBC: 6.1 10*3/uL (ref 3.4–10.8)

## 2020-11-09 LAB — HIV ANTIBODY (ROUTINE TESTING W REFLEX): HIV Screen 4th Generation wRfx: NONREACTIVE

## 2020-11-09 LAB — RPR: RPR Ser Ql: NONREACTIVE

## 2020-11-09 MED ORDER — TRIAMCINOLONE ACETONIDE 0.1 % EX CREA: 1.0000 "application " | TOPICAL_CREAM | Freq: Two times a day (BID) | CUTANEOUS | 1 refills | Status: DC

## 2020-11-09 NOTE — Telephone Encounter (Signed)
Pt called to report that he is completely out of his current supply, pt is requesting a refill as soon as possible

## 2020-11-09 NOTE — Telephone Encounter (Signed)
Pt is call back checking on the status of triamcinolone cream. Pt has had appt yesterday

## 2020-11-10 ENCOUNTER — Ambulatory Visit: Payer: Medicaid Other | Attending: Internal Medicine

## 2020-11-10 DIAGNOSIS — Z23 Encounter for immunization: Secondary | ICD-10-CM

## 2020-11-10 LAB — URINE CYTOLOGY ANCILLARY ONLY
Chlamydia: NEGATIVE
Comment: NEGATIVE
Comment: NEGATIVE
Comment: NORMAL
Neisseria Gonorrhea: NEGATIVE
Trichomonas: NEGATIVE

## 2020-11-10 NOTE — Progress Notes (Signed)
   Covid-19 Vaccination Clinic  Name:  Johnny [name]    MRN: [id number] DOB: Jul 06, 1961  11/10/2020  Johnny [name] was observed post Covid-19 immunization for 15 minutes without incident. He was provided with Vaccine Information Sheet and instruction to access the V-Safe system.   Johnny [name] was instructed to call 911 with any severe reactions post vaccine: [name] Kitchen Difficulty breathing  . Swelling of face and throat  . A fast heartbeat  . A bad rash all over body  . Dizziness and weakness   Immunizations Administered    Name Date Dose VIS Date Route   Pfizer COVID-19 Vaccine 11/10/2020  2:54 PM 0.3 mL 08/24/2020 Intramuscular   Manufacturer: [place]   Lot: [id number]   NDC: [address]-6

## 2020-11-14 ENCOUNTER — Other Ambulatory Visit: Payer: Self-pay

## 2020-11-14 ENCOUNTER — Other Ambulatory Visit: Payer: Medicaid Other

## 2020-11-14 DIAGNOSIS — Z20822 Contact with and (suspected) exposure to covid-19: Secondary | ICD-10-CM

## 2020-11-16 ENCOUNTER — Telehealth (INDEPENDENT_AMBULATORY_CARE_PROVIDER_SITE_OTHER): Payer: Self-pay

## 2020-11-16 LAB — NOVEL CORONAVIRUS, NAA: SARS-CoV-2, NAA: NOT DETECTED

## 2020-11-16 LAB — SARS-COV-2, NAA 2 DAY TAT

## 2020-11-16 NOTE — Telephone Encounter (Signed)
Copied from [place] [id number]. 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 He is asking for this to be submitted as soon as possible. >> Nov 15, 2020 11:50 AM Lennox Solders wrote: Pt is calling checking on the status of paperwork he gave office on 11-09-2019. Please call patient with update   Please advise

## 2020-11-17 NOTE — Telephone Encounter (Signed)
Patient called and message was relayed to him.  Patient would like to know if someone else can sign off on paperwork because it is time sensitive.  Please call patient to let him know.  CB# [phone]

## 2020-11-17 NOTE — Telephone Encounter (Signed)
Call placed to pt and mailbox is full, PCP is currently out of office and paperwork will be filled out as soon as she returns.

## 2020-11-21 NOTE — Telephone Encounter (Signed)
Pt was called and informed that paperwork will be ready as soon as PCP is back in office.

## 2020-11-24 ENCOUNTER — Ambulatory Visit: Payer: Medicaid Other | Attending: Family Medicine | Admitting: Family Medicine

## 2020-11-24 ENCOUNTER — Other Ambulatory Visit: Payer: Self-pay | Admitting: Family Medicine

## 2020-11-24 ENCOUNTER — Other Ambulatory Visit: Payer: Self-pay

## 2020-11-24 ENCOUNTER — Telehealth (INDEPENDENT_AMBULATORY_CARE_PROVIDER_SITE_OTHER): Payer: Self-pay

## 2020-11-24 DIAGNOSIS — G4733 Obstructive sleep apnea (adult) (pediatric): Secondary | ICD-10-CM

## 2020-11-24 DIAGNOSIS — M1A00X0 Idiopathic chronic gout, unspecified site, without tophus (tophi): Secondary | ICD-10-CM

## 2020-11-24 MED ORDER — COLCHICINE 0.6 MG PO TABS: ORAL_TABLET | ORAL | 0 refills | Status: DC

## 2020-11-24 NOTE — Telephone Encounter (Signed)
Medication Refill - Medication:   colchicine 0.6 MG tablet   Patient is all out of medication for his gout     Preferred Pharmacy (with phone number or street name):  [place] ([place]), [place] - [address] Phone:  [phone]  Fax:  [phone]       Agent: Please be advised that RX refills may take up to 3 business days. We ask that you follow-up with your pharmacy.

## 2020-11-24 NOTE — Telephone Encounter (Signed)
Attempted to reach patient to inform that both medications for gout were sent on 11/08/20 with refills. 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 Nat Christen, CMA    Copied from [place] [phone]. Topic: General - Other >> Nov 24, 2020  9:59 AM Keene Breath wrote: Reason for CRM: Patient would like to know if he could get a courtesy refill for his medication for gout.  He has sent a request for the medication but it will take at least 48 hrs. To process.  Please advise and call patient to let him know.  CB# [phone]

## 2020-11-24 NOTE — Progress Notes (Signed)
Pt is needing paperwork for school.

## 2020-11-24 NOTE — Progress Notes (Signed)
Virtual Visit via Telephone Note  I connected with Johnny [name], on 11/24/2020 at 12:05 PM by telephone due to the COVID-19 pandemic and verified that I am speaking with the correct person using two identifiers.   Consent: I discussed the limitations, risks, security and privacy concerns of performing an evaluation and management service by telephone and the availability of in person appointments. I also discussed with the patient that there may be a patient responsible charge related to this service. The patient expressed understanding and agreed to proceed.   Location of Patient: Home  Location of Provider: Clinic   Persons participating in Telemedicine visit: Octavio Matheney Farrington-CMA Dr. Margarita Rana     History of Present Illness: Johnny [name] a 60 year old male with a history of COPD,Gout, stageIVchronic kidney disease, hypertension, sleep apneawhopresents today requesting a letter to clear him to participate in substantial gainful activity.  He would like to secure a job where he can obtain additional income. 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   He states he cannot stay awake on the computer. He had a CPAP machine which he lost due to his previous housing situation and now has none.  He was wondering if he could receive Adderall to keep him awake. Past Medical History:  Diagnosis Date  . Adenomatous colon polyp    tubular  . Chronic kidney disease   . COPD (chronic obstructive pulmonary disease) ([place])   . Diverticulosis   . External otitis of right ear 11/10/2018  . Hepatitis C   . Hypertension   . OSA (obstructive sleep apnea) 11/03/2015   Allergies  Allergen Reactions  . Naproxen Other (See Comments)    Unknown    Current Outpatient Medications on File Prior to Visit  Medication Sig Dispense Refill  . albuterol (VENTOLIN HFA) 108 (90 Base) MCG/ACT inhaler Inhale 1-2 puffs into the lungs every 6 (six) hours as needed for wheezing or shortness of  breath. 18 g 1  . allopurinol (ZYLOPRIM) 100 MG tablet Take 2 tablets (200 mg total) by mouth daily. 60 tablet 5  . amLODipine (NORVASC) 10 MG tablet Take 1 tablet (10 mg total) by mouth daily. 30 tablet 3  . atorvastatin (LIPITOR) 40 MG tablet Take 1 tablet (40 mg total) by mouth daily. 30 tablet 3  . Blood Pressure Monitoring (3 SERIES BP MONITOR/UPPER ARM) DEVI 1 Units by Does not apply route 2 (two) times daily. 1 each 0  . colchicine 0.6 MG tablet 2 tabs po x 1, then one tab po 1 hour later prn Gout flare 30 tablet 3  . furosemide (LASIX) 20 MG tablet Take 1 tablet (20 mg total) by mouth daily as needed for edema. 30 tablet 3  . ipratropium-albuterol (DUONEB) 0.5-2.5 (3) MG/3ML SOLN Take 3 mLs by nebulization every 6 (six) hours as needed. 90 mL 1  . labetalol (NORMODYNE) 200 MG tablet TAKE 1 TABLET BY MOUTH THREE TIMES DAILY 90 tablet 3  . levothyroxine (SYNTHROID) 100 MCG tablet Take 1 tablet (100 mcg total) by mouth daily. 30 tablet 3  . Misc. Devices MISC Blood Pressure monitor. Dx: HTN 1 each 0  . mometasone-formoterol (DULERA) 100-5 MCG/ACT AERO Inhale 2 puffs into the lungs 2 (two) times daily. 13 g 3  . triamcinolone (KENALOG) 0.1 % Apply 1 application topically 2 (two) times daily. 45 g 1   No current facility-administered medications on file prior to visit.    Observations/Objective: Awake, alert, oriented x3 Not in acute distress  Assessment and Plan: 1. OSA (obstructive sleep apnea) Controlled due to not having his CPAP machine Advised that he has no diagnosis that indicates the need for Adderall He can use caffeinated products during the day I will speak with the case worker regarding the possibility of another CPAP machine but he has been advised that this will depend on his insurance company. I have provided a letter for him per request   Follow Up Instructions: Keep previously scheduled appointment   I discussed the assessment and treatment plan with the patient.  The patient was provided an opportunity to ask questions and all were answered. The patient agreed with the plan and demonstrated an understanding of the instructions.   The patient was advised to call back or seek an in-person evaluation if the symptoms worsen or if the condition fails to improve as anticipated.     I provided 11 minutes total of non-face-to-face time during this encounter including median intraservice time, reviewing previous notes, investigations, ordering medications, medical decision making, coordinating care and patient verbalized understanding at the end of the visit.     Charlott Rakes, MD, FAAFP. [facility] [place], [place]   11/24/2020, 12:05 PM

## 2020-12-01 ENCOUNTER — Telehealth: Payer: Self-pay

## 2020-12-01 ENCOUNTER — Encounter: Payer: Self-pay | Admitting: Physician Assistant

## 2020-12-01 ENCOUNTER — Other Ambulatory Visit: Payer: Self-pay

## 2020-12-01 ENCOUNTER — Ambulatory Visit: Payer: Medicaid Other | Attending: Physician Assistant | Admitting: Physician Assistant

## 2020-12-01 DIAGNOSIS — R41840 Attention and concentration deficit: Secondary | ICD-10-CM | POA: Diagnosis not present

## 2020-12-01 NOTE — Progress Notes (Signed)
Virtual Visit via Telephone Note  I connected with Johnny [name] on 12/01/20 at  9:10 AM EST by telephone and verified that I am speaking with the correct person using two identifiers.  Location: Patient: home Provider: [facility] Screened/roomed by Francisco Capuchin   I discussed the limitations, risks, security and privacy concerns of performing an evaluation and management service by telephone and the availability of in person appointments. I also discussed with the patient that there may be a patient responsible charge related to this service. The patient expressed understanding and agreed to proceed.   History of Present Illness:  Having problems focusing for his whole life.  Wants to be tested for ADD.  No new issues or concerns.  No memory issues.      Observations/Objective:  NAD.  A&Ox3   Assessment and Plan: 1. Difficulty concentrating - Ambulatory referral to Neuropsychology    Follow Up Instructions: See PCP as next planned.     I discussed the assessment and treatment plan with the patient. The patient was provided an opportunity to ask questions and all were answered. The patient agreed with the plan and demonstrated an understanding of the instructions.   The patient was advised to call back or seek an in-person evaluation if the symptoms worsen or if the condition fails to improve as anticipated.  I provided 8 minutes of non-face-to-face time during this encounter.   Freeman Caldron, PA-C  Patient ID: Johnny [name], male   DOB: [birth year], 60 y.o.   MRN: [id number]

## 2020-12-01 NOTE — Telephone Encounter (Signed)
Call placed to patient # [phone] regarding  questions about his CPAP machine. Unable to leave a message, voicemail full.

## 2020-12-29 ENCOUNTER — Other Ambulatory Visit: Payer: Self-pay | Admitting: Family Medicine

## 2020-12-29 DIAGNOSIS — M1A00X0 Idiopathic chronic gout, unspecified site, without tophus (tophi): Secondary | ICD-10-CM

## 2020-12-29 MED ORDER — COLCHICINE 0.6 MG PO TABS: ORAL_TABLET | ORAL | 0 refills | Status: DC

## 2020-12-29 NOTE — Telephone Encounter (Signed)
Medication Refill - Medication:   colchicine 0.6 MG tablet     Preferred Pharmacy (with phone number or street name):  [place] ([place]), [place] - [address] Phone:  [phone]  Fax:  [phone]       Agent: Please be advised that RX refills may take up to 3 business days. We ask that you follow-up with your pharmacy.

## 2021-01-03 DIAGNOSIS — J020 Streptococcal pharyngitis: Secondary | ICD-10-CM

## 2021-01-03 HISTORY — DX: Streptococcal pharyngitis: J02.0

## 2021-01-09 ENCOUNTER — Telehealth: Payer: Self-pay | Admitting: Family Medicine

## 2021-01-09 NOTE — Telephone Encounter (Signed)
Mashae P, from [facility] calling on behalf of pt. She states that the pt is needing to have a PA for the medication, colchicine. Please advise.      [phone]

## 2021-01-10 MED ORDER — COLCHICINE 0.6 MG PO CAPS: ORAL_CAPSULE | ORAL | 1 refills | Status: DC

## 2021-01-10 NOTE — Telephone Encounter (Signed)
Patient's ins prefers Mitigare capsules.  If appropriate can you please resend the colchicine script for Mitigare to [facility] .

## 2021-01-10 NOTE — Telephone Encounter (Signed)
Done

## 2021-01-16 ENCOUNTER — Ambulatory Visit: Payer: Medicaid Other | Attending: Family Medicine | Admitting: Family Medicine

## 2021-01-16 ENCOUNTER — Other Ambulatory Visit: Payer: Self-pay

## 2021-01-16 DIAGNOSIS — N185 Chronic kidney disease, stage 5: Secondary | ICD-10-CM

## 2021-01-16 DIAGNOSIS — I5032 Chronic diastolic (congestive) heart failure: Secondary | ICD-10-CM | POA: Diagnosis not present

## 2021-01-16 DIAGNOSIS — M10071 Idiopathic gout, right ankle and foot: Secondary | ICD-10-CM | POA: Diagnosis not present

## 2021-01-16 DIAGNOSIS — E039 Hypothyroidism, unspecified: Secondary | ICD-10-CM | POA: Diagnosis not present

## 2021-01-16 DIAGNOSIS — I10 Essential (primary) hypertension: Secondary | ICD-10-CM | POA: Diagnosis not present

## 2021-01-16 DIAGNOSIS — J432 Centrilobular emphysema: Secondary | ICD-10-CM | POA: Diagnosis not present

## 2021-01-16 DIAGNOSIS — E7800 Pure hypercholesterolemia, unspecified: Secondary | ICD-10-CM | POA: Diagnosis not present

## 2021-01-16 MED ORDER — AMLODIPINE BESYLATE 10 MG PO TABS: 10.0000 mg | ORAL_TABLET | Freq: Every day | ORAL | 6 refills | Status: DC

## 2021-01-16 MED ORDER — LEVOTHYROXINE SODIUM 100 MCG PO TABS: 100.0000 ug | ORAL_TABLET | Freq: Every day | ORAL | 6 refills | Status: DC

## 2021-01-16 MED ORDER — ATORVASTATIN CALCIUM 40 MG PO TABS: 40.0000 mg | ORAL_TABLET | Freq: Every day | ORAL | 6 refills | Status: DC

## 2021-01-16 MED ORDER — DULERA 100-5 MCG/ACT IN AERO
2.0000 | INHALATION_SPRAY | Freq: Two times a day (BID) | RESPIRATORY_TRACT | 6 refills | Status: DC
Start: 2021-01-16 — End: 2022-05-28

## 2021-01-16 MED ORDER — PREDNISONE 20 MG PO TABS: 20.0000 mg | ORAL_TABLET | Freq: Every day | ORAL | 0 refills | Status: DC

## 2021-01-16 MED ORDER — LABETALOL HCL 200 MG PO TABS: ORAL_TABLET | ORAL | 6 refills | Status: DC

## 2021-01-16 NOTE — Progress Notes (Signed)
Virtual Visit via Telephone Note  I connected with Johnny [name], on 01/16/2021 at 9:44 AM by telephone due to the COVID-19 pandemic and verified that I am speaking with the correct person using two identifiers.   Consent: I discussed the limitations, risks, security and privacy concerns of performing an evaluation and management service by telephone and the availability of in person appointments. I also discussed with the patient that there may be a patient responsible charge related to this service. The patient expressed understanding and agreed to proceed.   Location of Patient: Home  Location of Provider: Clinic   Persons participating in Telemedicine visit: Hance Caspers Farrington-CMA Dr. Margarita Rana     History of Present Illness: Johnny [name] a 60 year old male with a history of COPD,Gout, stageVchronic kidney disease, hypertension, sleep apnea sent for an acute visit   Complains of Gout flare which started this morning in his R ankle which feels like a sprained ankle. He is able to walk on it. He endorses slight swelling but no increased warmth. Denies excessive intake of red meat, has not had recent seafood or alcohol intake. He is out of his Colchicine as he was informed his insurance would not cover it.  He needs to schedule his appointment with his Nephrologist for management of his stage V chronic kidney disease as the last time he had an appointment he also had a gout flare and had to reschedule. He sometimes has wheezing on exertion and on further questioning he has not been using his ICS/LABA as prescribed but uses it as needed. He is also awaiting psychiatry evaluation for ADHD.  I have sent a message to the referral coordinator regarding this as notes reveal he was referred to a pediatric practice which did not see adults. Past Medical History:  Diagnosis Date  . Adenomatous colon polyp    tubular  . Chronic kidney disease   . COPD (chronic  obstructive pulmonary disease) ([place])   . Diverticulosis   . External otitis of right ear 11/10/2018  . Hepatitis C   . Hypertension   . OSA (obstructive sleep apnea) 11/03/2015   Allergies  Allergen Reactions  . Naproxen Other (See Comments)    Unknown    Current Outpatient Medications on File Prior to Visit  Medication Sig Dispense Refill  . albuterol (VENTOLIN HFA) 108 (90 Base) MCG/ACT inhaler Inhale 1-2 puffs into the lungs every 6 (six) hours as needed for wheezing or shortness of breath. 18 g 1  . allopurinol (ZYLOPRIM) 100 MG tablet Take 2 tablets (200 mg total) by mouth daily. 60 tablet 5  . amLODipine (NORVASC) 10 MG tablet Take 1 tablet (10 mg total) by mouth daily. 30 tablet 3  . atorvastatin (LIPITOR) 40 MG tablet Take 1 tablet (40 mg total) by mouth daily. 30 tablet 3  . Blood Pressure Monitoring (3 SERIES BP MONITOR/UPPER ARM) DEVI 1 Units by Does not apply route 2 (two) times daily. 1 each 0  . Colchicine (MITIGARE) 0.6 MG CAPS Take 2 capsules (1.2 mg) orally at the onset of a gout flare then repeat 1 capsule (0.6 mg) in 1 hour if pain continues. 90 capsule 1  . furosemide (LASIX) 20 MG tablet Take 1 tablet (20 mg total) by mouth daily as needed for edema. 30 tablet 3  . ipratropium-albuterol (DUONEB) 0.5-2.5 (3) MG/3ML SOLN Take 3 mLs by nebulization every 6 (six) hours as needed. 90 mL 1  . labetalol (NORMODYNE) 200 MG tablet TAKE  1 TABLET BY MOUTH THREE TIMES DAILY 90 tablet 3  . levothyroxine (SYNTHROID) 100 MCG tablet Take 1 tablet (100 mcg total) by mouth daily. 30 tablet 3  . Misc. Devices MISC Blood Pressure monitor. Dx: HTN 1 each 0  . mometasone-formoterol (DULERA) 100-5 MCG/ACT AERO Inhale 2 puffs into the lungs 2 (two) times daily. 13 g 3  . triamcinolone (KENALOG) 0.1 % Apply 1 application topically 2 (two) times daily. 45 g 1   No current facility-administered medications on file prior to visit.    ROS: See HPI  Observations/Objective: Awake, alert, and  2x3 Not in acute distress Normal mood  CMP Latest Ref Rng & Units 11/08/2020 12/07/2019 06/10/2019  Glucose 65 - 99 mg/dL 85 - 79  BUN 6 - 24 mg/dL 42(H) - 45(H)  Creatinine 0.76 - 1.27 mg/dL 5.39(H) - 3.41(H)  Sodium 134 - 144 mmol/L 135 - 140  Potassium 3.5 - 5.2 mmol/L 4.7 - 4.0  Chloride 96 - 106 mmol/L 98 - 101  CO2 20 - 29 mmol/L 24 - 24  Calcium 8.7 - 10.2 mg/dL 8.6(L) - 9.4  Total Protein 6.0 - 8.5 g/dL 7.5 7.6 7.6  Total Bilirubin 0.0 - 1.2 mg/dL 0.3 0.6 0.5  Alkaline Phos 44 - 121 IU/L 81 - 70  AST 0 - 40 IU/L 50(H) 35 48(H)  ALT 0 - 44 IU/L 37 34 42    Lipid Panel     Component Value Date/Time   CHOL 214 (H) 11/10/2018 1104   TRIG 80 11/10/2018 1104   HDL 96 11/10/2018 1104   CHOLHDL 2.2 11/10/2018 1104   CHOLHDL 2.3 12/07/2016 1006   VLDL 16 02/22/2009 0635   LDLCALC 102 (H) 11/10/2018 1104   LABVLDL 16 11/10/2018 1104     Assessment and Plan: 1. Essential hypertension Stable Counseled on blood pressure goal of less than 130/80, low-sodium, DASH diet, medication compliance, 150 minutes of moderate intensity exercise per week. Discussed medication compliance, adverse effects. - amLODipine (NORVASC) 10 MG tablet; Take 1 tablet (10 mg total) by mouth daily.  Dispense: 30 tablet; Refill: 6 - labetalol (NORMODYNE) 200 MG tablet; TAKE 1 TABLET BY MOUTH THREE TIMES DAILY  Dispense: 90 tablet; Refill: 6  2. Pure hypercholesterolemia Controlled - atorvastatin (LIPITOR) 40 MG tablet; Take 1 tablet (40 mg total) by mouth daily.  Dispense: 30 tablet; Refill: 6  3. Centrilobular emphysema ([place]) Uncontrolled Educated on use of his Ruthe Mannan which is a controller medication and albuterol to be used as needed - mometasone-formoterol (DULERA) 100-5 MCG/ACT AERO; Inhale 2 puffs into the lungs 2 (two) times daily.  Dispense: 13 g; Refill: 6  4. Acquired hypothyroidism Normal thyroid panel 2 months ago - levothyroxine (SYNTHROID) 100 MCG tablet; Take 1 tablet (100 mcg total) by  mouth daily.  Dispense: 30 tablet; Refill: 6  5. Chronic kidney disease (CKD), stage V ([place]) Renal function has worsened Advised to schedule appointment with his nephrologist Avoid nephrotoxins  6. Chronic diastolic congestive heart failure ([place]) He carries a diagnosis of diastolic heart failure however he has no echo on file We will order echocardiogram - ECHOCARDIOGRAM COMPLETE; Future  7. Acute idiopathic gout of right ankle Advised to avoid foods that trigger gout flares Short course of prednisone which she will use in addition to colchicine. - predniSONE (DELTASONE) 20 MG tablet; Take 1 tablet (20 mg total) by mouth daily with breakfast.  Dispense: 5 tablet; Refill: 0   Follow Up Instructions: 3 months   I discussed the  assessment and treatment plan with the patient. The patient was provided an opportunity to ask questions and all were answered. The patient agreed with the plan and demonstrated an understanding of the instructions.   The patient was advised to call back or seek an in-person evaluation if the symptoms worsen or if the condition fails to improve as anticipated.     I provided 17 minutes total of non-face-to-face time during this encounter.   Charlott Rakes, MD, FAAFP. [facility] and [facility] [place], [place]   01/16/2021, 9:44 AM

## 2021-01-23 ENCOUNTER — Emergency Department (HOSPITAL_COMMUNITY)
Admission: EM | Admit: 2021-01-23 | Discharge: 2021-01-24 | Disposition: A | Discharge status: Home / Self Care | Payer: Medicaid Other | Attending: Emergency Medicine | Admitting: Emergency Medicine

## 2021-01-23 DIAGNOSIS — E039 Hypothyroidism, unspecified: Secondary | ICD-10-CM | POA: Insufficient documentation

## 2021-01-23 DIAGNOSIS — J449 Chronic obstructive pulmonary disease, unspecified: Secondary | ICD-10-CM | POA: Insufficient documentation

## 2021-01-23 DIAGNOSIS — Z87891 Personal history of nicotine dependence: Secondary | ICD-10-CM | POA: Insufficient documentation

## 2021-01-23 DIAGNOSIS — Z79899 Other long term (current) drug therapy: Secondary | ICD-10-CM | POA: Insufficient documentation

## 2021-01-23 DIAGNOSIS — N184 Chronic kidney disease, stage 4 (severe): Secondary | ICD-10-CM | POA: Diagnosis not present

## 2021-01-23 DIAGNOSIS — I129 Hypertensive chronic kidney disease with stage 1 through stage 4 chronic kidney disease, or unspecified chronic kidney disease: Secondary | ICD-10-CM | POA: Insufficient documentation

## 2021-01-23 DIAGNOSIS — J020 Streptococcal pharyngitis: Secondary | ICD-10-CM | POA: Insufficient documentation

## 2021-01-23 DIAGNOSIS — R059 Cough, unspecified: Secondary | ICD-10-CM | POA: Diagnosis present

## 2021-01-23 LAB — GROUP A STREP BY PCR: Group A Strep by PCR: DETECTED — AB

## 2021-01-23 MED ORDER — AMOXICILLIN-POT CLAVULANATE 875-125 MG PO TABS
1.0000 | ORAL_TABLET | Freq: Once | ORAL | Status: AC
  Administered 2021-01-23: 1 via ORAL
  Filled 2021-01-23: qty 1

## 2021-01-23 MED ORDER — PREDNISONE 20 MG PO TABS
60.0000 mg | ORAL_TABLET | Freq: Once | ORAL | Status: AC
  Administered 2021-01-23: 60 mg via ORAL
  Filled 2021-01-23: qty 3

## 2021-01-23 NOTE — Discharge Instructions (Signed)
The test show that you have a strep infection of your throat causing your symptoms.  Start the medications, prescribed, tomorrow.  See your doctor for checkup next week.  Return here if needed for problems or difficulty taking medications.

## 2021-01-23 NOTE — ED Triage Notes (Signed)
Pt reports  sore throat x 3 days also reports right sided dental pain.

## 2021-01-23 NOTE — ED Provider Notes (Signed)
[place] [facility] EMERGENCY DEPARTMENT Provider Note   CSN: [id number] Arrival date & time: 01/23/21  1500     History Chief Complaint  Patient presents with  . Sore Throat    Johnny [name] is a 60 y.o. male.  HPI He complains of sore throat for 4 days associated with headache, malaise, achiness and cough.  He occasionally coughs up mucus.  Symptoms worse with trying to sleep at night and he is having trouble sleeping.  He does not take any daily medications except nebulizer for COPD.  He has previously had renal failure, treated with dialysis, but has been off dialysis for 10 years.  He denies chest pain, weakness or dizziness.  There are no other known modifying factors    Past Medical History:  Diagnosis Date  . Adenomatous colon polyp    tubular  . Chronic kidney disease   . COPD (chronic obstructive pulmonary disease) ([place])   . Diverticulosis   . External otitis of right ear 11/10/2018  . Hepatitis C   . Hypertension   . OSA (obstructive sleep apnea) 11/03/2015    Patient Active Problem List   Diagnosis Date Noted  . Toenail fungus 06/11/2019  . Homelessness 06/11/2019  . Other atopic dermatitis 06/11/2019  . Hypothyroidism 06/11/2019  . Eczema of hand 11/10/2018  . 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 Chronic diastolic congestive heart failure ([place]) 11/10/2018  . Hyperlipidemia 11/10/2018  . Anxiety and depression 02/12/2018  . History of tobacco use disorder 12/07/2016  . Gout 12/07/2016  . COPD (chronic obstructive pulmonary disease) ([place]) 06/07/2016  . CKD (chronic kidney disease) stage 4, GFR 15-29 ml/min ([facility]) 06/07/2016  . Essential hypertension 06/07/2016  . OSA (obstructive sleep apnea) 11/03/2015    Past Surgical History:  Procedure Laterality Date  . AV FISTULA PLACEMENT  09/12/2009   Left arm AVF       Family History  Problem Relation Age of Onset  . Hypertension Mother   . Diabetes Mother   . Colon cancer Neg Hx     Social  History   Tobacco Use  . Smoking status: Former Smoker    Packs/day: 0.10    Types: Cigarettes    Quit date: 04/02/2011    Years since quitting: 9.8  . Smokeless tobacco: Never Used  . Tobacco comment: trying to quit  Vaping Use  . Vaping Use: Never used  Substance Use Topics  . Alcohol use: Never    Alcohol/week: 0.0 standard drinks  . Drug use: No    Home Medications Prior to Admission medications   Medication Sig Start Date End Date Taking? Authorizing Provider  albuterol (VENTOLIN HFA) 108 (90 Base) MCG/ACT inhaler Inhale 1-2 puffs into the lungs every 6 (six) hours as needed for wheezing or shortness of breath. 11/08/20   Argentina Donovan, PA-C  allopurinol (ZYLOPRIM) 100 MG tablet Take 2 tablets (200 mg total) by mouth daily. 11/08/20   Argentina Donovan, PA-C  amLODipine (NORVASC) 10 MG tablet Take 1 tablet (10 mg total) by mouth daily. 01/16/21   Charlott Rakes, MD  atorvastatin (LIPITOR) 40 MG tablet Take 1 tablet (40 mg total) by mouth daily. 01/16/21   Charlott Rakes, MD  Blood Pressure Monitoring (3 SERIES BP MONITOR/UPPER ARM) DEVI 1 Units by Does not apply route 2 (two) times daily. 10/13/19   Elsie Stain, MD  Colchicine (MITIGARE) 0.6 MG CAPS Take 2 capsules (1.2 mg) orally at the onset of a gout flare then repeat  1 capsule (0.6 mg) in 1 hour if pain continues. 01/10/21   Charlott Rakes, MD  furosemide (LASIX) 20 MG tablet Take 1 tablet (20 mg total) by mouth daily as needed for edema. 11/08/20   Argentina Donovan, PA-C  ipratropium-albuterol (DUONEB) 0.5-2.5 (3) MG/3ML SOLN Take 3 mLs by nebulization every 6 (six) hours as needed. 07/15/20   Charlott Rakes, MD  labetalol (NORMODYNE) 200 MG tablet TAKE 1 TABLET BY MOUTH THREE TIMES DAILY 01/16/21   Charlott Rakes, MD  levothyroxine (SYNTHROID) 100 MCG tablet Take 1 tablet (100 mcg total) by mouth daily. 01/16/21   Charlott Rakes, MD  Misc. Devices MISC Blood Pressure monitor. Dx: HTN 11/25/19   Charlott Rakes, MD   mometasone-formoterol (DULERA) 100-5 MCG/ACT AERO Inhale 2 puffs into the lungs 2 (two) times daily. 01/16/21   Charlott Rakes, MD  predniSONE (DELTASONE) 20 MG tablet Take 1 tablet (20 mg total) by mouth daily with breakfast. 01/16/21   Charlott Rakes, MD  triamcinolone (KENALOG) 0.1 % Apply 1 application topically 2 (two) times daily. 11/09/20   Charlott Rakes, MD    Allergies    Naproxen  Review of Systems   Review of Systems  All other systems reviewed and are negative.   Physical Exam Updated Vital Signs BP (!) 157/93 (BP Location: Right Arm)   Pulse 80   Temp 98.4 F (36.9 C) (Oral)   Resp 19   SpO2 100%   Physical Exam Vitals and nursing note reviewed.  Constitutional:      General: He is not in acute distress.    Appearance: He is well-developed. He is not ill-appearing, toxic-appearing or diaphoretic.  HENT:     Head: Normocephalic and atraumatic.     Right Ear: External ear normal.     Left Ear: External ear normal.     Nose: No congestion.     Mouth/Throat:     Comments: Mild pain with opening mouth but no trismus.  Mild right peritonsillar swelling without evidence for frank abscess.  Uvula is in the midline.  No visible or palpable dental abnormality. Eyes:     Conjunctiva/sclera: Conjunctivae normal.     Pupils: Pupils are equal, round, and reactive to light.  Neck:     Trachea: Phonation normal.  Cardiovascular:     Rate and Rhythm: Normal rate and regular rhythm.     Heart sounds: Normal heart sounds.     Comments: Nontender fistula left upper arm, from remote dialysis use. Pulmonary:     Effort: Pulmonary effort is normal.     Breath sounds: Normal breath sounds.  Abdominal:     General: There is no distension.     Palpations: Abdomen is soft.     Tenderness: There is no abdominal tenderness.  Musculoskeletal:        General: Normal range of motion.     Cervical back: Normal range of motion and neck supple.  Skin:    General: Skin is warm and  dry.  Neurological:     Mental Status: He is alert and oriented to person, place, and time.     Cranial Nerves: No cranial nerve deficit.     Sensory: No sensory deficit.     Motor: No abnormal muscle tone.     Coordination: Coordination normal.  Psychiatric:        Mood and Affect: Mood normal.        Behavior: Behavior normal.        Thought Content: Thought content  normal.        Judgment: Judgment normal.     ED Results / Procedures / Treatments   Labs (all labs ordered are listed, but only abnormal results are displayed) Labs Reviewed  GROUP A STREP BY PCR - Abnormal; Notable for the following components:      Result Value   Group A Strep by PCR DETECTED (*)    All other components within normal limits    EKG None  Radiology No results found.  Procedures Procedures   Medications Ordered in ED Medications  predniSONE (DELTASONE) tablet 60 mg (60 mg Oral Given 01/23/21 2233)  amoxicillin-clavulanate (AUGMENTIN) 875-125 MG per tablet 1 tablet (1 tablet Oral Given 01/23/21 2233)    ED Course  I have reviewed the triage vital signs and the nursing notes.  Pertinent labs & imaging results that were available during my care of the patient were reviewed by me and considered in my medical decision making (see chart for details).    MDM Rules/Calculators/A&P                           Patient Vitals for the past 24 hrs:  BP Temp Temp src Pulse Resp SpO2  01/24/21 0032 (!) 157/93 98.4 F (36.9 C) Oral 80 19 100 %  01/23/21 2340 (!) 152/98 -- -- 79 18 99 %  01/23/21 2215 (!) 144/89 -- -- 71 -- 98 %  01/23/21 1954 138/75 98.5 F (36.9 C) -- 87 18 98 %  01/23/21 1631 140/89 98.6 F (37 C) Oral 76 16 98 %    10:04 AM Reevaluation with update and discussion. After initial assessment and treatment, an updated evaluation reveals no change in status, findings discussed with the patient all questions answered. Daleen Bo   Medical Decision Making:  This patient is  presenting for evaluation of sore throat with cough, which does require a range of treatment options, and is a complaint that involves a moderate risk of morbidity and mortality. The differential diagnoses include viral pharyngitis, bacterial pharyngitis, pneumonia. I decided to review old records, and in summary middle-aged male presenting with URI symptoms and sore throat.  I did not require additional historical information from anyone.  Clinical Laboratory Tests Ordered, included Strep test. Review indicates Positive.  Critical Interventions-evaluation, laboratory testing, medication treatment, observation  After These Interventions, the Patient was reevaluated and was found stable for discharge.  Patient with streptococcal pharyngitis.  Mild right peritonsillar swelling without strong clinical evidence for abscess, and 4-day illness making abscess less likely.  Nntibiotics and analgesia begun, with prescription sent to his pharmacy.  Instructions for follow-up care given  CRITICAL CARE-no Performed by: Daleen Bo  Nursing Notes Reviewed/ Care Coordinated Applicable Imaging Reviewed Interpretation of Laboratory Data incorporated into ED treatment  The patient appears reasonably screened and/or stabilized for discharge and I doubt any other medical condition or other [facility] requiring further screening, evaluation, or treatment in the ED at this time prior to discharge.  Plan: Home Medications-continue routine medications and use symptomatic OTC medications; Home Treatments-rest, fluids; return here if the recommended treatment, does not improve the symptoms; Recommended follow up-PCP, as needed     Final Clinical Impression(s) / ED Diagnoses Final diagnoses:  Pharyngitis due to Streptococcus species    Rx / DC Orders ED Discharge Orders    None       Daleen Bo, MD 01/24/21 1004

## 2021-01-24 NOTE — ED Notes (Signed)
Patient verbalized understanding of discharge instructions. Opportunity for questions and answers.  

## 2021-01-25 ENCOUNTER — Telehealth: Payer: Self-pay | Admitting: *Deleted

## 2021-01-25 NOTE — Telephone Encounter (Signed)
Transition Care Management Unsuccessful Follow-up Telephone Call  Date of discharge and from where: 01-24-21 [facility]  Attempts: 1st   Reason for unsuccessful TCM follow-up call:  No answer/ voice mail not set up

## 2021-01-26 ENCOUNTER — Telehealth: Payer: Self-pay | Admitting: *Deleted

## 2021-01-26 ENCOUNTER — Encounter: Payer: Self-pay | Admitting: Physician Assistant

## 2021-01-26 ENCOUNTER — Other Ambulatory Visit: Payer: Self-pay

## 2021-01-26 ENCOUNTER — Ambulatory Visit: Payer: Medicaid Other | Attending: Physician Assistant | Admitting: Physician Assistant

## 2021-01-26 DIAGNOSIS — Z09 Encounter for follow-up examination after completed treatment for conditions other than malignant neoplasm: Secondary | ICD-10-CM

## 2021-01-26 DIAGNOSIS — J020 Streptococcal pharyngitis: Secondary | ICD-10-CM

## 2021-01-26 MED ORDER — AMOXICILLIN-POT CLAVULANATE 875-125 MG PO TABS: 1.0000 | ORAL_TABLET | Freq: Two times a day (BID) | ORAL | 0 refills | Status: DC

## 2021-01-26 NOTE — Progress Notes (Signed)
Patient ID: Johnny [name], male   DOB: 03/04/1961, 60 y.o.   MRN: [id number] Virtual Visit via Telephone Note  I connected with Johnny [name] on 01/26/21 at  3:10 PM EDT by telephone and verified that I am speaking with the correct person using two identifiers.  Location: Patient: home Provider: [facility] office   I discussed the limitations, risks, security and privacy concerns of performing an evaluation and management service by telephone and the availability of in person appointments. I also discussed with the patient that there may be a patient responsible charge related to this service. The patient expressed understanding and agreed to proceed.  History of Present Illness: Patient seen at ED 01/23/2021 and diagnosed with strep pharyngitis but was only given one dose of medication and a dose of prednisone. Not feeling better.  Missed class 3/22-3/25(feels like he will miss tomorrow).  No fever.  Swallowing is better and throat in swelling is improving.     Observations/Objective:  NAD.  Voice is clear.  A&Ox3   Assessment and Plan: 1. Strep pharyngitis -salt water gargles tid - amoxicillin-clavulanate (AUGMENTIN) 875-125 MG tablet; Take 1 tablet by mouth 2 (two) times daily.  Dispense: 20 tablet; Refill: 0  2. Hospital f/up  Follow Up Instructions: 3 months appt with PCP   I discussed the assessment and treatment plan with the patient. The patient was provided an opportunity to ask questions and all were answered. The patient agreed with the plan and demonstrated an understanding of the instructions.   The patient was advised to call back or seek an in-person evaluation if the symptoms worsen or if the condition fails to improve as anticipated.  I provided 9 minutes of non-face-to-face time during this encounter.   Freeman Caldron, PA-C

## 2021-01-26 NOTE — Progress Notes (Signed)
ER visit needs meds due to strep throat

## 2021-01-26 NOTE — Telephone Encounter (Signed)
Transition Care Management Unsuccessful Follow-up Telephone Call  Date of discharge and from where:  01/24/2021 [name]  Attempts:  2nd Attempt  Reason for unsuccessful TCM follow-up call:  Unable to leave message - no VM

## 2021-01-27 NOTE — Telephone Encounter (Signed)
Transition Care Management Unsuccessful Follow-up Telephone Call  Date of discharge and from where:  01/24/2021 [name]  Attempts:  3rd Attempt  Reason for unsuccessful TCM follow-up call:  Unable to leave message - no VM

## 2021-01-29 ENCOUNTER — Emergency Department (HOSPITAL_COMMUNITY): Payer: Medicaid Other

## 2021-01-29 ENCOUNTER — Encounter (HOSPITAL_COMMUNITY): Payer: Self-pay

## 2021-01-29 ENCOUNTER — Emergency Department (HOSPITAL_COMMUNITY)
Admission: EM | Admit: 2021-01-29 | Discharge: 2021-01-30 | Disposition: A | Discharge status: Home / Self Care | Payer: Medicaid Other | Attending: Emergency Medicine | Admitting: Emergency Medicine

## 2021-01-29 ENCOUNTER — Encounter (HOSPITAL_COMMUNITY): Payer: Self-pay | Admitting: Emergency Medicine

## 2021-01-29 ENCOUNTER — Ambulatory Visit (HOSPITAL_COMMUNITY)
Admission: EM | Admit: 2021-01-29 | Discharge: 2021-01-29 | Disposition: A | Discharge status: Home / Self Care | Payer: Medicaid Other | Attending: Emergency Medicine | Admitting: Emergency Medicine

## 2021-01-29 ENCOUNTER — Other Ambulatory Visit: Payer: Self-pay

## 2021-01-29 DIAGNOSIS — I130 Hypertensive heart and chronic kidney disease with heart failure and stage 1 through stage 4 chronic kidney disease, or unspecified chronic kidney disease: Secondary | ICD-10-CM | POA: Insufficient documentation

## 2021-01-29 DIAGNOSIS — R42 Dizziness and giddiness: Secondary | ICD-10-CM

## 2021-01-29 DIAGNOSIS — Z87891 Personal history of nicotine dependence: Secondary | ICD-10-CM | POA: Insufficient documentation

## 2021-01-29 DIAGNOSIS — R221 Localized swelling, mass and lump, neck: Secondary | ICD-10-CM

## 2021-01-29 DIAGNOSIS — N184 Chronic kidney disease, stage 4 (severe): Secondary | ICD-10-CM | POA: Insufficient documentation

## 2021-01-29 DIAGNOSIS — J449 Chronic obstructive pulmonary disease, unspecified: Secondary | ICD-10-CM | POA: Insufficient documentation

## 2021-01-29 DIAGNOSIS — I5032 Chronic diastolic (congestive) heart failure: Secondary | ICD-10-CM | POA: Diagnosis not present

## 2021-01-29 DIAGNOSIS — Z7951 Long term (current) use of inhaled steroids: Secondary | ICD-10-CM | POA: Insufficient documentation

## 2021-01-29 DIAGNOSIS — I479 Paroxysmal tachycardia, unspecified: Secondary | ICD-10-CM | POA: Diagnosis not present

## 2021-01-29 DIAGNOSIS — I8290 Acute embolism and thrombosis of unspecified vein: Secondary | ICD-10-CM

## 2021-01-29 DIAGNOSIS — Z79899 Other long term (current) drug therapy: Secondary | ICD-10-CM | POA: Diagnosis not present

## 2021-01-29 DIAGNOSIS — R222 Localized swelling, mass and lump, trunk: Secondary | ICD-10-CM | POA: Diagnosis not present

## 2021-01-29 DIAGNOSIS — I82890 Acute embolism and thrombosis of other specified veins: Secondary | ICD-10-CM | POA: Diagnosis not present

## 2021-01-29 DIAGNOSIS — E039 Hypothyroidism, unspecified: Secondary | ICD-10-CM | POA: Insufficient documentation

## 2021-01-29 DIAGNOSIS — M7989 Other specified soft tissue disorders: Secondary | ICD-10-CM | POA: Diagnosis not present

## 2021-01-29 DIAGNOSIS — I749 Embolism and thrombosis of unspecified artery: Secondary | ICD-10-CM | POA: Diagnosis not present

## 2021-01-29 DIAGNOSIS — R2232 Localized swelling, mass and lump, left upper limb: Secondary | ICD-10-CM | POA: Diagnosis not present

## 2021-01-29 NOTE — ED Notes (Signed)
EMS called, asked to transport pt to ED non emergency.  EMS called as pt is dizzy and can not drive.

## 2021-01-29 NOTE — ED Triage Notes (Signed)
Pt presents today with pain/swelling to left shoulder x 3 days. Denies injury. Does report being recently treated for strep, continues antibiotic treatment.

## 2021-01-29 NOTE — ED Provider Notes (Signed)
[place]    CSN: [id number] Arrival date & time: 01/29/21  1651      History   Chief Complaint Chief Complaint  Patient presents with  . Shoulder Pain    left    HPI Johnny [name] is a 60 y.o. male.   Patient presents with a painful swollen mass at the left base of his neck just above his clavicle x3 days.  No falls or injury.  He has a dialysis shunt in his left arm.  He denies numbness or weakness in his left arm or hand.  He denies fever or chills.  He also reports he feels lightheaded.  He denies chest pain, shortness of breath, or other symptoms.  Patient was seen at [facility] ED on 01/23/2021; diagnosed with strep throat; given Augmentin and prednisone in the ED.  He had a E-visit on 01/26/2021; diagnosed with strep pharyngitis; treated with Augmentin.  His medical history includes hypertension, CHF, COPD, CKD, hepatitis C, diverticulosis, hypothyroidism, anxiety, depression.  The history is provided by the patient and medical records.    Past Medical History:  Diagnosis Date  . Adenomatous colon polyp    tubular  . Chronic kidney disease   . COPD (chronic obstructive pulmonary disease) ([place])   . Diverticulosis   . External otitis of right ear 11/10/2018  . Hepatitis C   . Hypertension   . OSA (obstructive sleep apnea) 11/03/2015  . Strep throat 01/2021    Patient Active Problem List   Diagnosis Date Noted  . Toenail fungus 06/11/2019  . Homelessness 06/11/2019  . Other atopic dermatitis 06/11/2019  . Hypothyroidism 06/11/2019  . Eczema of hand 11/10/2018  . 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 Chronic diastolic congestive heart failure ([place]) 11/10/2018  . Hyperlipidemia 11/10/2018  . Anxiety and depression 02/12/2018  . History of tobacco use disorder 12/07/2016  . Gout 12/07/2016  . COPD (chronic obstructive pulmonary disease) ([place]) 06/07/2016  . CKD (chronic kidney disease) stage 4, GFR 15-29 ml/min (HCC) 06/07/2016  . Essential hypertension 06/07/2016  .  OSA (obstructive sleep apnea) 11/03/2015    Past Surgical History:  Procedure Laterality Date  . AV FISTULA PLACEMENT  09/12/2009   Left arm AVF       Home Medications    Prior to Admission medications   Medication Sig Start Date End Date Taking? Authorizing Provider  allopurinol (ZYLOPRIM) 100 MG tablet Take 2 tablets (200 mg total) by mouth daily. 11/08/20  Yes Freeman Caldron M, PA-C  amLODipine (NORVASC) 10 MG tablet Take 1 tablet (10 mg total) by mouth daily. 01/16/21  Yes Charlott Rakes, MD  amoxicillin-clavulanate (AUGMENTIN) 875-125 MG tablet Take 1 tablet by mouth 2 (two) times daily. 01/26/21  Yes McClung, Angela M, PA-C  Colchicine (MITIGARE) 0.6 MG CAPS Take 2 capsules (1.2 mg) orally at the onset of a gout flare then repeat 1 capsule (0.6 mg) in 1 hour if pain continues. 01/10/21  Yes Charlott Rakes, MD  furosemide (LASIX) 20 MG tablet Take 1 tablet (20 mg total) by mouth daily as needed for edema. 11/08/20  Yes McClung, Dionne Bucy, PA-C  ipratropium-albuterol (DUONEB) 0.5-2.5 (3) MG/3ML SOLN Take 3 mLs by nebulization every 6 (six) hours as needed. 07/15/20  Yes Newlin, Charlane Ferretti, MD  labetalol (NORMODYNE) 200 MG tablet TAKE 1 TABLET BY MOUTH THREE TIMES DAILY 01/16/21  Yes Charlott Rakes, MD  levothyroxine (SYNTHROID) 100 MCG tablet Take 1 tablet (100 mcg total) by mouth daily. 01/16/21  Yes Charlott Rakes, MD  mometasone-formoterol (DULERA) 100-5 MCG/ACT AERO Inhale 2 puffs into the lungs 2 (two) times daily. 01/16/21  Yes Charlott Rakes, MD  predniSONE (DELTASONE) 20 MG tablet Take 1 tablet (20 mg total) by mouth daily with breakfast. 01/16/21  Yes Newlin, Enobong, MD  albuterol (VENTOLIN HFA) 108 (90 Base) MCG/ACT inhaler Inhale 1-2 puffs into the lungs every 6 (six) hours as needed for wheezing or shortness of breath. 11/08/20   Argentina Donovan, PA-C  atorvastatin (LIPITOR) 40 MG tablet Take 1 tablet (40 mg total) by mouth daily. 01/16/21   Charlott Rakes, MD  Blood Pressure  Monitoring (3 SERIES BP MONITOR/UPPER ARM) DEVI 1 Units by Does not apply route 2 (two) times daily. 10/13/19   Elsie Stain, MD  Misc. Devices MISC Blood Pressure monitor. Dx: HTN 11/25/19   Charlott Rakes, MD  triamcinolone (KENALOG) 0.1 % Apply 1 application topically 2 (two) times daily. 11/09/20   Charlott Rakes, MD    Family History Family History  Problem Relation Age of Onset  . Hypertension Mother   . Diabetes Mother   . Colon cancer Neg Hx     Social History Social History   Tobacco Use  . Smoking status: Former Smoker    Packs/day: 0.10    Types: Cigarettes    Quit date: 04/02/2011    Years since quitting: 9.8  . Smokeless tobacco: Never Used  . Tobacco comment: trying to quit  Vaping Use  . Vaping Use: Never used  Substance Use Topics  . Alcohol use: Never    Alcohol/week: 0.0 standard drinks  . Drug use: No     Allergies   Naproxen   Review of Systems Review of Systems  Constitutional: Negative for chills and fever.  HENT: Negative for ear pain and sore throat.   Eyes: Negative for pain and visual disturbance.  Respiratory: Negative for cough and shortness of breath.   Cardiovascular: Negative for chest pain and palpitations.  Gastrointestinal: Negative for abdominal pain and vomiting.  Genitourinary: Negative for dysuria and hematuria.  Musculoskeletal: Negative for arthralgias and back pain.       Painful mass at base of neck  Skin: Negative for color change and rash.  Neurological: Positive for light-headedness. Negative for syncope, facial asymmetry, weakness and numbness.  All other systems reviewed and are negative.    Physical Exam Triage Vital Signs ED Triage Vitals [01/29/21 1704]  Enc Vitals Group     BP      Pulse      Resp      Temp      Temp src      SpO2      Weight      Height      Head Circumference      Peak Flow      Pain Score 6     Pain Loc      Pain Edu?      Excl. in [place]?    No data found.  Updated Vital  Signs BP (!) 152/88 (BP Location: Right Arm)   Pulse 71   Resp 20   SpO2 100%   Visual Acuity Right Eye Distance:   Left Eye Distance:   Bilateral Distance:    Right Eye Near:   Left Eye Near:    Bilateral Near:     Physical Exam Vitals and nursing note reviewed.  Constitutional:      General: He is not in acute distress.    Appearance: He is well-developed.  HENT:     Head: Normocephalic and atraumatic.     Mouth/Throat:     Mouth: Mucous membranes are moist.  Eyes:     Conjunctiva/sclera: Conjunctivae normal.  Cardiovascular:     Rate and Rhythm: Normal rate and regular rhythm.     Heart sounds: Normal heart sounds.  Pulmonary:     Effort: Pulmonary effort is normal. No respiratory distress.     Breath sounds: Normal breath sounds.  Abdominal:     Palpations: Abdomen is soft.     Tenderness: There is no abdominal tenderness.  Musculoskeletal:     Cervical back: Neck supple.  Skin:    General: Skin is warm and dry.     Findings: No bruising or erythema.          Comments: Large tender soft area of swelling at left base of neck just above the clavicle.  No open wounds, erythema, ecchymosis.  Neurological:     General: No focal deficit present.     Mental Status: He is alert and oriented to person, place, and time.     Sensory: No sensory deficit.     Motor: No weakness.     Gait: Gait normal.  Psychiatric:        Mood and Affect: Mood normal.        Behavior: Behavior normal.      UC Treatments / Results  Labs (all labs ordered are listed, but only abnormal results are displayed) Labs Reviewed - No data to display  EKG   Radiology No results found.  Procedures Procedures (including critical care time)  Medications Ordered in UC Medications - No data to display  Initial Impression / Assessment and Plan / UC Course  I have reviewed the triage vital signs and the nursing notes.  Pertinent labs & imaging results that were available during my  care of the patient were reviewed by me and considered in my medical decision making (see chart for details).   Mass in left lower neck.  Lightheadedness.  EKG shows sinus rhythm, rate 69, no ST elevation, compared to previous from 2017.  States he does not feel stable to transport himself to the ED for evaluation and requests EMS.   Final Clinical Impressions(s) / UC Diagnoses   Final diagnoses:  Mass in neck  Lightheadedness   Discharge Instructions   None    ED Prescriptions    None     PDMP not reviewed this encounter.   Sharion Balloon, NP 01/29/21 1736

## 2021-01-29 NOTE — ED Triage Notes (Addendum)
EMS report Patient sent from UC for lump/mass on left clavicle area. Denies trauma and has noticed same x 2-3 days. Patient has had fistula to left arm for 15 years that was never used. On assessment raised area tender to touch

## 2021-01-29 NOTE — ED Notes (Signed)
Vitals called x4

## 2021-01-30 ENCOUNTER — Emergency Department (HOSPITAL_COMMUNITY): Payer: Medicaid Other

## 2021-01-30 DIAGNOSIS — I82890 Acute embolism and thrombosis of other specified veins: Secondary | ICD-10-CM | POA: Diagnosis not present

## 2021-01-30 DIAGNOSIS — R2232 Localized swelling, mass and lump, left upper limb: Secondary | ICD-10-CM | POA: Diagnosis not present

## 2021-01-30 DIAGNOSIS — R222 Localized swelling, mass and lump, trunk: Secondary | ICD-10-CM | POA: Diagnosis not present

## 2021-01-30 LAB — BASIC METABOLIC PANEL
Anion gap: 7 (ref 5–15)
BUN: 47 mg/dL — ABNORMAL HIGH (ref 6–20)
CO2: 20 mmol/L — ABNORMAL LOW (ref 22–32)
Calcium: 8.1 mg/dL — ABNORMAL LOW (ref 8.9–10.3)
Chloride: 105 mmol/L (ref 98–111)
Creatinine, Ser: 4.74 mg/dL — ABNORMAL HIGH (ref 0.61–1.24)
GFR, Estimated: 14 mL/min — ABNORMAL LOW (ref >60)
Glucose, Bld: 101 mg/dL — ABNORMAL HIGH (ref 70–99)
Potassium: 4.2 mmol/L (ref 3.5–5.1)
Sodium: 132 mmol/L — ABNORMAL LOW (ref 135–145)

## 2021-01-30 LAB — CBC WITH DIFFERENTIAL/PLATELET
Abs Immature Granulocytes: 0.04 10*3/uL (ref 0.00–0.07)
Basophils Absolute: 0.1 10*3/uL (ref 0.0–0.1)
Basophils Relative: 1 %
Eosinophils Absolute: 0.4 10*3/uL (ref 0.0–0.5)
Eosinophils Relative: 4 %
HCT: 33 % — ABNORMAL LOW (ref 39.0–52.0)
Hemoglobin: 11.7 g/dL — ABNORMAL LOW (ref 13.0–17.0)
Immature Granulocytes: 1 %
Lymphocytes Relative: 25 %
Lymphs Abs: 2.2 10*3/uL (ref 0.7–4.0)
MCH: 27.3 pg (ref 26.0–34.0)
MCHC: 35.5 g/dL (ref 30.0–36.0)
MCV: 76.9 fL — ABNORMAL LOW (ref 80.0–100.0)
Monocytes Absolute: 1.2 10*3/uL — ABNORMAL HIGH (ref 0.1–1.0)
Monocytes Relative: 13 %
Neutro Abs: 4.9 10*3/uL (ref 1.7–7.7)
Neutrophils Relative %: 56 %
Platelets: 259 10*3/uL (ref 150–400)
RBC: 4.29 MIL/uL (ref 4.22–5.81)
RDW: 16.8 % — ABNORMAL HIGH (ref 11.5–15.5)
WBC: 8.8 10*3/uL (ref 4.0–10.5)
nRBC: 0 % (ref 0.0–0.2)

## 2021-01-30 MED ORDER — AMLODIPINE BESYLATE 5 MG PO TABS
10.0000 mg | ORAL_TABLET | Freq: Once | ORAL | Status: AC
  Administered 2021-01-30: 10 mg via ORAL
  Filled 2021-01-30: qty 2

## 2021-01-30 MED ORDER — LABETALOL HCL 200 MG PO TABS
200.0000 mg | ORAL_TABLET | Freq: Once | ORAL | Status: AC
  Administered 2021-01-30: 200 mg via ORAL
  Filled 2021-01-30: qty 1

## 2021-01-30 NOTE — ED Provider Notes (Signed)
[facility] EMERGENCY DEPARTMENT Provider Note   CSN: [id number] Arrival date & time: 01/29/21  1821     History Chief Complaint - clavicle swelling/pain  Johnny [name] is a 60 y.o. male.  The history is provided by the patient.  Patient presents with pain and swelling around his left clavicle for the past 3 days.  Denies trauma.  The pain is worsening.  Pain is worsened with palpation and movement.  No fevers or or headache.  No visual changes.  No arm or leg weakness.  No chest pain or shortness of breath. He has a fistula in the left arm since 2010, but he has never required dialysis. He has never had this issue before Patient went to urgent care first and was sent here for further evaluation.  He reports he was mildly dizzy at the urgent care, that is now improved    Past Medical History:  Diagnosis Date  . Adenomatous colon polyp    tubular  . Chronic kidney disease   . COPD (chronic obstructive pulmonary disease) ([place])   . Diverticulosis   . External otitis of right ear 11/10/2018  . Hepatitis C   . Hypertension   . OSA (obstructive sleep apnea) 11/03/2015  . Strep throat 01/2021    Patient Active Problem List   Diagnosis Date Noted  . Toenail fungus 06/11/2019  . Homelessness 06/11/2019  . Other atopic dermatitis 06/11/2019  . Hypothyroidism 06/11/2019  . Eczema of hand 11/10/2018  . 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 Chronic diastolic congestive heart failure ([place]) 11/10/2018  . Hyperlipidemia 11/10/2018  . Anxiety and depression 02/12/2018  . History of tobacco use disorder 12/07/2016  . Gout 12/07/2016  . COPD (chronic obstructive pulmonary disease) ([place]) 06/07/2016  . CKD (chronic kidney disease) stage 4, GFR 15-29 ml/min (HCC) 06/07/2016  . Essential hypertension 06/07/2016  . OSA (obstructive sleep apnea) 11/03/2015    Past Surgical History:  Procedure Laterality Date  . AV FISTULA PLACEMENT  09/12/2009   Left arm AVF       Family  History  Problem Relation Age of Onset  . Hypertension Mother   . Diabetes Mother   . Colon cancer Neg Hx     Social History   Tobacco Use  . Smoking status: Former Smoker    Packs/day: 0.10    Types: Cigarettes    Quit date: 04/02/2011    Years since quitting: 9.8  . Smokeless tobacco: Never Used  . Tobacco comment: trying to quit  Vaping Use  . Vaping Use: Never used  Substance Use Topics  . Alcohol use: Never    Alcohol/week: 0.0 standard drinks  . Drug use: No    Home Medications Prior to Admission medications   Medication Sig Start Date End Date Taking? Authorizing Provider  albuterol (VENTOLIN HFA) 108 (90 Base) MCG/ACT inhaler Inhale 1-2 puffs into the lungs every 6 (six) hours as needed for wheezing or shortness of breath. 11/08/20   Argentina Donovan, PA-C  allopurinol (ZYLOPRIM) 100 MG tablet Take 2 tablets (200 mg total) by mouth daily. 11/08/20   Argentina Donovan, PA-C  amLODipine (NORVASC) 10 MG tablet Take 1 tablet (10 mg total) by mouth daily. 01/16/21   Charlott Rakes, MD  amoxicillin-clavulanate (AUGMENTIN) 875-125 MG tablet Take 1 tablet by mouth 2 (two) times daily. 01/26/21   Argentina Donovan, PA-C  atorvastatin (LIPITOR) 40 MG tablet Take 1 tablet (40 mg total) by mouth daily. 01/16/21   Newlin,  Enobong, MD  Blood Pressure Monitoring (3 SERIES BP MONITOR/UPPER ARM) DEVI 1 Units by Does not apply route 2 (two) times daily. 10/13/19   Elsie Stain, MD  Colchicine (MITIGARE) 0.6 MG CAPS Take 2 capsules (1.2 mg) orally at the onset of a gout flare then repeat 1 capsule (0.6 mg) in 1 hour if pain continues. 01/10/21   Charlott Rakes, MD  furosemide (LASIX) 20 MG tablet Take 1 tablet (20 mg total) by mouth daily as needed for edema. 11/08/20   Argentina Donovan, PA-C  ipratropium-albuterol (DUONEB) 0.5-2.5 (3) MG/3ML SOLN Take 3 mLs by nebulization every 6 (six) hours as needed. 07/15/20   Charlott Rakes, MD  labetalol (NORMODYNE) 200 MG tablet TAKE 1 TABLET BY MOUTH  THREE TIMES DAILY 01/16/21   Charlott Rakes, MD  levothyroxine (SYNTHROID) 100 MCG tablet Take 1 tablet (100 mcg total) by mouth daily. 01/16/21   Charlott Rakes, MD  Misc. Devices MISC Blood Pressure monitor. Dx: HTN 11/25/19   Charlott Rakes, MD  mometasone-formoterol (DULERA) 100-5 MCG/ACT AERO Inhale 2 puffs into the lungs 2 (two) times daily. 01/16/21   Charlott Rakes, MD  predniSONE (DELTASONE) 20 MG tablet Take 1 tablet (20 mg total) by mouth daily with breakfast. 01/16/21   Charlott Rakes, MD  triamcinolone (KENALOG) 0.1 % Apply 1 application topically 2 (two) times daily. 11/09/20   Charlott Rakes, MD    Allergies    Naproxen  Review of Systems   Review of Systems  Constitutional: Negative for fever.  Eyes: Negative for visual disturbance.  Respiratory: Negative for shortness of breath.   Cardiovascular: Negative for chest pain.  Musculoskeletal: Positive for joint swelling.  Neurological: Negative for weakness, numbness and headaches.  All other systems reviewed and are negative.   Physical Exam Updated Vital Signs BP (!) 157/100 (BP Location: Right Arm)   Pulse 79   Temp 98.2 F (36.8 C) (Oral)   Resp 18   SpO2 100%   Physical Exam CONSTITUTIONAL: Well developed/well nourished HEAD: Normocephalic/atraumatic EYES: EOMI/PERRL ENMT: Mucous membranes moist NECK: supple no meningeal signs, no carotid bruit SPINE/BACK:entire spine nontender CV: S1/S2 noted, no murmurs/rubs/gallops noted LUNGS: Lungs are clear to auscultation bilaterally, no apparent distress ABDOMEN: soft, nontender, no rebound or guarding, bowel sounds noted throughout abdomen GU:no cva tenderness NEURO: Pt is awake/alert/appropriate, moves all extremitiesx4.  No facial droop.   EXTREMITIES: pulses normal/equal, full ROM, AV fistula noted to the left arm with thrill noted Patient has soft tissue mass just posterior to the left clavicle.  No erythema or crepitus.  There appears to be a bruit and thrill  to this area.  It is tender to palpation.  There is no tenderness noted to the left clavicle Arms are symmetric, no unilateral edema SKIN: warm, color normal PSYCH: no abnormalities of mood noted, alert and oriented to situation  ED Results / Procedures / Treatments   Labs (all labs ordered are listed, but only abnormal results are displayed) Labs Reviewed  BASIC METABOLIC PANEL - Abnormal; Notable for the following components:      Result Value   Sodium 132 (*)    CO2 20 (*)    Glucose, Bld 101 (*)    BUN 47 (*)    Creatinine, Ser 4.74 (*)    Calcium 8.1 (*)    GFR, Estimated 14 (*)    All other components within normal limits  CBC WITH DIFFERENTIAL/PLATELET - Abnormal; Notable for the following components:   Hemoglobin 11.7 (*)  HCT 33.0 (*)    MCV 76.9 (*)    RDW 16.8 (*)    Monocytes Absolute 1.2 (*)    All other components within normal limits    EKG None  Radiology DG Clavicle Left  Result Date: 01/29/2021 CLINICAL DATA:  Swelling above the left clavicle. EXAM: LEFT CLAVICLE - 2+ VIEWS COMPARISON:  None. FINDINGS: There is no evidence of fracture or other focal bone lesions. No radiopaque foreign body. Soft tissues are unremarkable. IMPRESSION: Negative. Electronically Signed   By: Ulyses Jarred M.D.   On: 01/29/2021 19:23   [place] CHEST SOFT TISSUE  Result Date: 01/30/2021 CLINICAL DATA:  Left supraclavicular mass EXAM: ULTRASOUND OF CHEST SOFT TISSUES TECHNIQUE: Ultrasound examination of the chest wall soft tissues was performed in the area of clinical concern. COMPARISON:  None. FINDINGS: Evaluation of the a left supraclavicular region in the area of palpable abnormality demonstrates multiple dilated vessels in keeping with multiple superficial venous collaterals, possibly reflecting the cephalic arch on image # 4. There is thrombosis of a vascular collateral within the supraclavicular region corresponding to the palpable abnormality. A small amount of surrounding  subcutaneous edema is noted. Multiple chest wall collaterals are identified ultimately communicating with a patent right internal jugular vein. Cine sequence # 3 demonstrates a patent vessel within the supraclavicular region slightly deeper possibly representing a patent subclavian vein. IMPRESSION: Palpable abnormality within the left supraclavicular region represents a thrombosed superficial venous collateral. The visualized left internal jugular vein and left subclavian vein appear patent. Electronically Signed   By: Fidela Salisbury MD   On: 01/30/2021 06:24    Procedures Procedures   Medications Ordered in ED Medications - No data to display  ED Course  I have reviewed the triage vital signs and the nursing notes.  Pertinent labs & imaging results that were available during my care of the patient were reviewed by me and considered in my medical decision making (see chart for details).    MDM Rules/Calculators/A&P                          5:23 AM Patient presents with mass near the left clavicle for the past 3 days.  It appears to have a thrill on exam.  Unfortunately due to his chronic renal disease, he would not be able to receive IV contrast for CT scan.  After discussion with radiology, they recommend soft tissue ultrasound and applied Doppler flow to evaluate whether this is a abscess versus a vascular lesion.  Concern for aneurysm/pseudoaneurysm 6:55 AM Ultrasound findings discussed with Dr. Stanford Breed with vascular No acute intervention is required at this time.  He does recommend close follow-up with vascular.  Ambulatory referrals been placed for vascular surgery.  Patient feels improved.  He declines pain medicines. Advised him to keep his arm elevated.  If there is any new weakness, numbness or swelling in the left arm in the next 2 to 3 days he should return. Advised patient of worsening renal function and need for follow-up Final Clinical Impression(s) / ED Diagnoses Final  diagnoses:  Mass in chest  Thrombosis    Rx / DC Orders ED Discharge Orders    None       Ripley Fraise, MD 01/30/21 [phone]

## 2021-01-30 NOTE — ED Notes (Signed)
Pt returned from US

## 2021-01-30 NOTE — Discharge Instructions (Addendum)
Please keep your left arm elevated.  If you have any new swelling, pain or weakness in the left arm in the next 2 to 3 days please return to the ER

## 2021-01-31 ENCOUNTER — Telehealth: Payer: Self-pay

## 2021-01-31 NOTE — Telephone Encounter (Signed)
Transition Care Management Follow-up Telephone Call  Date of discharge and from where: 01/30/2021 from [facility]  How have you been since you were released from the hospital? Pt stated that he is still sore but the pain is not any worse.   Any questions or concerns? No  Items Reviewed:  Did the pt receive and understand the discharge instructions provided? Yes   Medications obtained and verified? Yes   Other? No   Any new allergies since your discharge? No   Dietary orders reviewed? n/a  Do you have support at home? Yes   Functional Questionnaire: (I = Independent and D = Dependent) ADLs: I  Bathing/Dressing- I  Meal Prep- I  Eating- I  Maintaining continence- I  Transferring/Ambulation- I  Managing Meds- I   Follow up appointments reviewed:   PCP Hospital f/u appt confirmed? No    Specialist Hospital f/u appt confirmed? No    Are transportation arrangements needed? No   If their condition worsens, is the pt aware to call PCP or go to the Emergency Dept.? Yes  Was the patient provided with contact information for the PCP's office or ED? Yes  Was to pt encouraged to call back with questions or concerns? Yes

## 2021-02-03 ENCOUNTER — Other Ambulatory Visit: Payer: Self-pay | Admitting: Family Medicine

## 2021-02-09 ENCOUNTER — Other Ambulatory Visit: Payer: Self-pay

## 2021-02-09 ENCOUNTER — Encounter: Payer: Self-pay | Admitting: Vascular Surgery

## 2021-02-09 ENCOUNTER — Ambulatory Visit (INDEPENDENT_AMBULATORY_CARE_PROVIDER_SITE_OTHER): Payer: Medicaid Other | Admitting: Vascular Surgery

## 2021-02-09 VITALS — BP 145/78 | HR 76 | Temp 97.7°F | Resp 16 | Ht 73.0 in | Wt 211.0 lb

## 2021-02-09 DIAGNOSIS — I808 Phlebitis and thrombophlebitis of other sites: Secondary | ICD-10-CM | POA: Diagnosis not present

## 2021-02-09 NOTE — Progress Notes (Signed)
REASON FOR CONSULT:    "Palpable abnormality within the left supraclavicular region" this is a referral from the emergency department.  ASSESSMENT & PLAN:   SUPERFICIAL THROMBOPHLEBITIS LEFT UPPER EXTREMITY: This patient has a functioning left upper arm fistula which likely has some outflow obstruction or central venous obstruction given significant collateral development around the shoulder.  One of the superficial veins is thrombosed but currently this is not symptomatic.  He understands that if it becomes more symptomatic warm compresses would be helpful.  However this appears to be resolving on its own.  There is no evidence of DVT in the subclavian vein by duplex.  Given that he is not using his fistula I do not think fistulogram or any further work-up is indicated at this time.  He will call if things change.   Deitra Mayo, MD Office: [phone]   HPI:   Johnny [name] is a pleasant 60 y.o. male, this patient was referred from the emergency department with a palpable abnormality within the left supraclavicular region.  I have reviewed the note from the ER on 01/30/2021.  The patient presented with pain and swelling around the left clavicle which had been going on for 3 days.  He denied any trauma.  He had no fever.  He had a fistula in his left arm since 2010 but has never required dialysis.  On exam, he was noted to have a soft tissue mass just posterior to the left clavicle.  They did do an ultrasound during that visit.  In the area of concern there were multiple dilated vessels consistent with superficial venous collaterals.  There was thrombus of a vascular collateral within the supraclavicular region which corresponded to the palpable abnormality.  It was felt that the mass represented a thrombosed superficial venous collateral.  The left internal jugular vein and left subclavian veins were patent.  On my history, the patient's symptoms in the left supraclavicular area have  resolved.  He is no longer having significant pain in this area.  The masses improved.  He denies any chest pain or shortness of breath.  He has had no significant left upper extremity swelling.  This patient had a left brachiocephalic fistula placed in 2013 but has not been on dialysis.  He has chronic kidney disease related to hypertension.  Past Medical History:  Diagnosis Date  . Adenomatous colon polyp    tubular  . Chronic kidney disease   . COPD (chronic obstructive pulmonary disease) ([place])   . Diverticulosis   . External otitis of right ear 11/10/2018  . Hepatitis C   . Hypertension   . OSA (obstructive sleep apnea) 11/03/2015  . Strep throat 01/2021    Family History  Problem Relation Age of Onset  . Hypertension Mother   . Diabetes Mother   . Colon cancer Neg Hx     SOCIAL HISTORY: Social History   Socioeconomic History  . Marital status: Divorced    Spouse name: Not on file  . Number of children: Not on file  . Years of education: 24  . Highest education level: Not on file  Occupational History  . Occupation: Electronics engineer  Tobacco Use  . Smoking status: Former Smoker    Packs/day: 0.10    Types: Cigarettes    Quit date: 04/02/2011    Years since quitting: 9.8  . Smokeless tobacco: Never Used  . Tobacco comment: trying to quit  Vaping Use  . Vaping Use: Never used  Substance and  Sexual Activity  . Alcohol use: Never    Alcohol/week: 0.0 standard drinks  . Drug use: No  . Sexual activity: Not Currently  Other Topics Concern  . Not on file  Social History Narrative   Lives at home alone.   Right-handed.    Occasional caffeine use.   Social Determinants of Health   Financial Resource Strain: Not on file  Food Insecurity: Not on file  Transportation Needs: Not on file  Physical Activity: Not on file  Stress: Not on file  Social Connections: Not on file  Intimate Partner Violence: Not on file    Allergies  Allergen Reactions  . Naproxen  Other (See Comments)    Unknown    Current Outpatient Medications  Medication Sig Dispense Refill  . allopurinol (ZYLOPRIM) 100 MG tablet Take 2 tablets (200 mg total) by mouth daily. 60 tablet 5  . amLODipine (NORVASC) 10 MG tablet Take 1 tablet (10 mg total) by mouth daily. 30 tablet 6  . amoxicillin-clavulanate (AUGMENTIN) 875-125 MG tablet Take 1 tablet by mouth 2 (two) times daily. 20 tablet 0  . Blood Pressure Monitoring (3 SERIES BP MONITOR/UPPER ARM) DEVI 1 Units by Does not apply route 2 (two) times daily. 1 each 0  . Colchicine (MITIGARE) 0.6 MG CAPS Take 2 capsules (1.2 mg) orally at the onset of a gout flare then repeat 1 capsule (0.6 mg) in 1 hour if pain continues. 90 capsule 1  . furosemide (LASIX) 20 MG tablet Take 1 tablet (20 mg total) by mouth daily as needed for edema. 30 tablet 3  . ipratropium-albuterol (DUONEB) 0.5-2.5 (3) MG/3ML SOLN USE 1 AMPULE IN NEBULIZER EVERY 6 HOURS AS NEEDED 90 mL 0  . labetalol (NORMODYNE) 200 MG tablet TAKE 1 TABLET BY MOUTH THREE TIMES DAILY 90 tablet 6  . levothyroxine (SYNTHROID) 100 MCG tablet Take 1 tablet (100 mcg total) by mouth daily. 30 tablet 6  . Misc. Devices MISC Blood Pressure monitor. Dx: HTN 1 each 0  . mometasone-formoterol (DULERA) 100-5 MCG/ACT AERO Inhale 2 puffs into the lungs 2 (two) times daily. 13 g 6  . predniSONE (DELTASONE) 20 MG tablet Take 1 tablet (20 mg total) by mouth daily with breakfast. 5 tablet 0  . triamcinolone (KENALOG) 0.1 % Apply 1 application topically 2 (two) times daily. 45 g 1  . albuterol (VENTOLIN HFA) 108 (90 Base) MCG/ACT inhaler Inhale 1-2 puffs into the lungs every 6 (six) hours as needed for wheezing or shortness of breath. (Patient not taking: Reported on 02/09/2021) 18 g 1  . atorvastatin (LIPITOR) 40 MG tablet Take 1 tablet (40 mg total) by mouth daily. (Patient not taking: Reported on 02/09/2021) 30 tablet 6   No current facility-administered medications for this visit.    REVIEW OF  SYSTEMS:  [X]  denotes positive finding, [ ]  denotes negative finding Cardiac  Comments:  Chest pain or chest pressure:    Shortness of breath upon exertion:    Short of breath when lying flat:    Irregular heart rhythm:        Vascular    Pain in calf, thigh, or hip brought on by ambulation:    Pain in feet at night that wakes you up from your sleep:     Blood clot in your veins:    Leg swelling:         Pulmonary    Oxygen at home:    Productive cough:     Wheezing:  Neurologic    Sudden weakness in arms or legs:     Sudden numbness in arms or legs:     Sudden onset of difficulty speaking or slurred speech:    Temporary loss of vision in one eye:     Problems with dizziness:         Gastrointestinal    Blood in stool:     Vomited blood:         Genitourinary    Burning when urinating:     Blood in urine:        Psychiatric    Major depression:         Hematologic    Bleeding problems:    Problems with blood clotting too easily:        Skin    Rashes or ulcers:        Constitutional    Fever or chills:     PHYSICAL EXAM:   Vitals:   02/09/21 0901  BP: (!) 145/78  Pulse: 76  Resp: 16  Temp: 97.7 F (36.5 C)  TempSrc: Temporal  SpO2: 97%  Weight: 211 lb (95.7 kg)  Height: 6\' 1"  (1.854 m)    GENERAL: The patient is a well-nourished male, in no acute distress. The vital signs are documented above. CARDIAC: There is a regular rate and rhythm.  VASCULAR: He has palpable radial pulses bilaterally. 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 He has extensive collaterals around the left shoulder suggesting an outflow obstruction or central venous narrowing.  1 of these collaterals is thrombosed. PULMONARY: There is good air exchange bilaterally without wheezing or rales. MUSCULOSKELETAL: There are no major deformities or cyanosis. NEUROLOGIC: No focal weakness or paresthesias are detected. SKIN: There are no ulcers or rashes  noted. PSYCHIATRIC: The patient has a normal affect.  DATA:    DUPLEX LEFT UPPER EXTREMITY: I reviewed the duplex that was done in the emergency department which showed thrombus in a superficial collateral near the left clavicle.  There was no evidence of DVT.

## 2021-02-10 ENCOUNTER — Ambulatory Visit (HOSPITAL_COMMUNITY): Payer: Medicaid Other

## 2021-02-10 ENCOUNTER — Ambulatory Visit (HOSPITAL_COMMUNITY)
Admission: RE | Admit: 2021-02-10 | Discharge: 2021-02-10 | Disposition: A | Discharge status: Home / Self Care | Payer: Medicaid Other | Source: Ambulatory Visit | Attending: Family Medicine | Admitting: Family Medicine

## 2021-02-10 DIAGNOSIS — R9431 Abnormal electrocardiogram [ECG] [EKG]: Secondary | ICD-10-CM | POA: Diagnosis not present

## 2021-02-10 DIAGNOSIS — I5032 Chronic diastolic (congestive) heart failure: Secondary | ICD-10-CM | POA: Diagnosis not present

## 2021-02-10 DIAGNOSIS — R0602 Shortness of breath: Secondary | ICD-10-CM | POA: Diagnosis not present

## 2021-02-10 DIAGNOSIS — I340 Nonrheumatic mitral (valve) insufficiency: Secondary | ICD-10-CM | POA: Insufficient documentation

## 2021-02-10 LAB — ECHOCARDIOGRAM COMPLETE: S' Lateral: 4 cm

## 2021-02-10 NOTE — Progress Notes (Signed)
  Echocardiogram 2D Echocardiogram has been performed.  Johnny [name] 02/10/2021, 2:55 PM

## 2021-02-13 ENCOUNTER — Telehealth: Payer: Self-pay | Admitting: Family Medicine

## 2021-02-13 NOTE — Telephone Encounter (Signed)
Patient called in and stated that he had a missed call from Opal Sidles he can be reached at Ph# [phone]

## 2021-02-13 NOTE — Telephone Encounter (Signed)
Copied from [place] [id number]. Topic: General - Inquiry >> Feb 13, 2021  9:50 AM Scherrie Gerlach wrote: Reason for CRM: pt would like Opal Sidles to give him a call today

## 2021-02-13 NOTE — Telephone Encounter (Signed)
Call returned to patient. He explained that he has been working with the [employer] to secure permanent housing.  They have high rise apartments available but he is hesitant about living in a high rise. He said he experiences acrophobia and claustrophobia. He is not sure of the cause but believes some of this may be due to spending time in prison.  He has discussed this with his housing caseworker and said that they are requesting a letter from his provider that addressed these issues in order to place him in a ground floor unit.   He said he has not been diagnosed by a medical professional, he diagnosed this himself. He has not discussed these concerns with his PCP, Dr Margarita Rana.  Explained to him that Dr Margarita Rana is out of the office this week and he said that this request is time sensitive.  He would like to see another provider to discuss this .  Provided him with the phone number for [place] clinic to call to schedule an appointment. He was not interested in the [place] walk in times. Instructed him to call this CM back if he is not able to schedule an appointment

## 2021-02-14 ENCOUNTER — Telehealth: Payer: Self-pay | Admitting: Nurse Practitioner

## 2021-02-14 NOTE — Telephone Encounter (Signed)
Called and spoke with Patient at length he stated that he is Claustrophobic and has Phobias and needed assistant with housing. I explained to Pt we where a Medical Office and he would need to follow up with is PCP. Pt would need to [facility] with [facility] to get diagnosed  Pt is needing documentation mentioning that he has these medical condtions to get houseing on a ground level floor or a single unit

## 2021-02-15 ENCOUNTER — Telehealth: Payer: Self-pay | Admitting: Family Medicine

## 2021-02-15 NOTE — Telephone Encounter (Signed)
Pt is requesting a letter stating that he is claustrophobic and he has a phobia of height for his housing assignment.

## 2021-02-15 NOTE — Telephone Encounter (Signed)
Copied from [place] [phone] [phone]. Topic: Quick Communication - See Telephone Encounter >> Feb 15, 2021  9:35 AM Loma [place] wrote: CRM for notification. See Telephone encounter for: 02/15/21.pt is wanting a cb back from provider re making sure he has proper documentation for upcoming housing qualification  and files are updated making aware of his medical needs for qualification. Pt request cb asap [phone]

## 2021-02-16 ENCOUNTER — Telehealth: Payer: Self-pay | Admitting: Family Medicine

## 2021-02-16 DIAGNOSIS — R41840 Attention and concentration deficit: Secondary | ICD-10-CM

## 2021-02-16 NOTE — Telephone Encounter (Signed)
Pt stated he called about being tested for ADHD and stated he never heard anything back / Pt stated he was suppose to have a referral somewhere but I didn't see a referral / please advise/ pt declined appt

## 2021-02-20 ENCOUNTER — Telehealth: Payer: Self-pay

## 2021-02-20 NOTE — Telephone Encounter (Signed)
-----   Message from Ladell Pier, MD sent at 02/11/2021  4:28 PM EDT ----- Let patient know that heart function looks okay.  He has some thickening of the heart muscle.  Good blood pressure control is important.  Continue current blood pressure medication and furosemide.

## 2021-02-20 NOTE — Telephone Encounter (Signed)
Pt was called and VM is currently full. 

## 2021-02-21 NOTE — Telephone Encounter (Signed)
Done

## 2021-02-24 ENCOUNTER — Telehealth: Payer: Self-pay

## 2021-02-24 NOTE — Telephone Encounter (Signed)
Pt was called and VM is currently full.   Letter will be placed upfront for pick up.

## 2021-02-24 NOTE — Telephone Encounter (Signed)
Patient name and DOB has been verified Patient was informed of lab results. Patient had no questions.  

## 2021-02-24 NOTE — Telephone Encounter (Signed)
-----   Message from Ladell Pier, MD sent at 02/11/2021  4:28 PM EDT ----- Let patient know that heart function looks okay.  He has some thickening of the heart muscle.  Good blood pressure control is important.  Continue current blood pressure medication and furosemide.

## 2021-03-01 ENCOUNTER — Telehealth: Payer: Self-pay | Admitting: Family Medicine

## 2021-03-01 NOTE — Telephone Encounter (Signed)
Copied from [place] [id number]. Topic: Appointment Scheduling - Scheduling Inquiry for Clinic >> Feb 13, 2021  9:57 AM Scherrie Gerlach wrote: Reason for CRM: pt needs to see Dr Margarita Rana asap.  He states he needs to see her for verification and diagnosis to get him into the housing program. He states the June appt is too far out.  He needs quickly. >> Feb 28, 2021  9:13 AM Fredna Dow, [place] wrote: I have called the patient to get him scheduled for a sooner appt. Patient voicemail is full and was not able to leave a message requesting a call back.   Called patient, no answer, unable to leave VM x2. Per previous tele encounter on 4/13 patient has a letter at front for pickup if patient has not already picked it up. If patient is still in need of appointment please schedule soonest available if patient returns call.

## 2021-03-03 NOTE — Telephone Encounter (Signed)
Referral was placed by Levada Dy 11/2020 and referral note states that referral was closed because the practice only saw pediatrics.  I have placed another referral.

## 2021-03-06 NOTE — Telephone Encounter (Signed)
Called pt unable to reach left VM with instructions that requested referral was placed by Dr Margarita Rana.  Contact and phone number provided to reach back for additional questions or concerns.

## 2021-03-09 ENCOUNTER — Encounter: Payer: Self-pay | Admitting: Physician Assistant

## 2021-03-14 ENCOUNTER — Other Ambulatory Visit (HOSPITAL_COMMUNITY): Payer: Medicaid Other

## 2021-04-21 ENCOUNTER — Other Ambulatory Visit: Payer: Self-pay | Admitting: Family Medicine

## 2021-04-21 DIAGNOSIS — I10 Essential (primary) hypertension: Secondary | ICD-10-CM

## 2021-05-12 ENCOUNTER — Encounter (HOSPITAL_COMMUNITY): Payer: Self-pay | Admitting: Emergency Medicine

## 2021-05-12 ENCOUNTER — Other Ambulatory Visit: Payer: Self-pay

## 2021-05-12 ENCOUNTER — Ambulatory Visit (HOSPITAL_COMMUNITY)
Admission: EM | Admit: 2021-05-12 | Discharge: 2021-05-12 | Disposition: A | Discharge status: Home / Self Care | Payer: Medicaid Other | Attending: Urgent Care | Admitting: Urgent Care

## 2021-05-12 DIAGNOSIS — N481 Balanitis: Secondary | ICD-10-CM

## 2021-05-12 DIAGNOSIS — N184 Chronic kidney disease, stage 4 (severe): Secondary | ICD-10-CM | POA: Insufficient documentation

## 2021-05-12 DIAGNOSIS — Z7251 High risk heterosexual behavior: Secondary | ICD-10-CM | POA: Insufficient documentation

## 2021-05-12 LAB — HIV ANTIBODY (ROUTINE TESTING W REFLEX): HIV Screen 4th Generation wRfx: NONREACTIVE

## 2021-05-12 MED ORDER — CLOTRIMAZOLE-BETAMETHASONE 1-0.05 % EX CREA: TOPICAL_CREAM | CUTANEOUS | 0 refills | Status: DC

## 2021-05-12 NOTE — ED Provider Notes (Signed)
[place]   MRN: [id number] DOB: 27-Nov-1960  Subjective:   Johnny [name] is a 60 y.o. male presenting for uncomfortable stinging rash over the head of the penis.  Reports that symptoms started after he had unprotected sex with a new partner.  Has had some slight drainage but no blisters.  Denies fever, nausea, vomiting, dysuria, penile discharge.  He does have a history of CKD stage IV but is not on dialysis, history of hepatitis C.  No current facility-administered medications for this encounter.  Current Outpatient Medications:    albuterol (VENTOLIN HFA) 108 (90 Base) MCG/ACT inhaler, Inhale 1-2 puffs into the lungs every 6 (six) hours as needed for wheezing or shortness of breath. (Patient not taking: Reported on 02/09/2021), Disp: 18 g, Rfl: 1   allopurinol (ZYLOPRIM) 100 MG tablet, Take 2 tablets (200 mg total) by mouth daily., Disp: 60 tablet, Rfl: 5   amLODipine (NORVASC) 10 MG tablet, Take 1 tablet (10 mg total) by mouth daily., Disp: 30 tablet, Rfl: 6   amoxicillin-clavulanate (AUGMENTIN) 875-125 MG tablet, Take 1 tablet by mouth 2 (two) times daily., Disp: 20 tablet, Rfl: 0   atorvastatin (LIPITOR) 40 MG tablet, Take 1 tablet (40 mg total) by mouth daily. (Patient not taking: Reported on 02/09/2021), Disp: 30 tablet, Rfl: 6   Blood Pressure Monitoring (3 SERIES BP MONITOR/UPPER ARM) DEVI, 1 Units by Does not apply route 2 (two) times daily., Disp: 1 each, Rfl: 0   Colchicine (MITIGARE) 0.6 MG CAPS, Take 2 capsules (1.2 mg) orally at the onset of a gout flare then repeat 1 capsule (0.6 mg) in 1 hour if pain continues., Disp: 90 capsule, Rfl: 1   furosemide (LASIX) 20 MG tablet, Take 1 tablet (20 mg total) by mouth daily as needed for edema., Disp: 30 tablet, Rfl: 3   ipratropium-albuterol (DUONEB) 0.5-2.5 (3) MG/3ML SOLN, USE 1 AMPULE IN NEBULIZER EVERY 6 HOURS AS NEEDED, Disp: 90 mL, Rfl: 0   labetalol (NORMODYNE) 200 MG tablet, TAKE 1 TABLET BY MOUTH THREE TIMES  DAILY, Disp: 90 tablet, Rfl: 6   levothyroxine (SYNTHROID) 100 MCG tablet, Take 1 tablet (100 mcg total) by mouth daily., Disp: 30 tablet, Rfl: 6   Misc. Devices MISC, Blood Pressure monitor. Dx: HTN, Disp: 1 each, Rfl: 0   mometasone-formoterol (DULERA) 100-5 MCG/ACT AERO, Inhale 2 puffs into the lungs 2 (two) times daily., Disp: 13 g, Rfl: 6   predniSONE (DELTASONE) 20 MG tablet, Take 1 tablet (20 mg total) by mouth daily with breakfast., Disp: 5 tablet, Rfl: 0   triamcinolone (KENALOG) 0.1 %, Apply 1 application topically 2 (two) times daily., Disp: 45 g, Rfl: 1   Allergies  Allergen Reactions   Naproxen Other (See Comments)    Unknown    Past Medical History:  Diagnosis Date   Adenomatous colon polyp    tubular   Chronic kidney disease    COPD (chronic obstructive pulmonary disease) ([facility])    Diverticulosis    External otitis of right ear 11/10/2018   Hepatitis C    Hypertension    OSA (obstructive sleep apnea) 11/03/2015   Strep throat 01/2021     Past Surgical History:  Procedure Laterality Date   AV FISTULA PLACEMENT  09/12/2009   Left arm AVF    Family History  Problem Relation Age of Onset   Hypertension Mother    Diabetes Mother    Colon cancer Neg Hx     Social History  Tobacco Use   Smoking status: Former    Packs/day: 0.10    Pack years: 0.00    Types: Cigarettes    Quit date: 04/02/2011    Years since quitting: 10.1   Smokeless tobacco: Never   Tobacco comments:    trying to quit  Vaping Use   Vaping Use: Never used  Substance Use Topics   Alcohol use: Never    Alcohol/week: 0.0 standard drinks   Drug use: No    ROS   Objective:   Vitals: BP (!) 150/91 (BP Location: Right Arm)   Pulse 77   Temp 98.5 F (36.9 C) (Oral)   Resp 14   SpO2 95%   Physical Exam Constitutional:      General: He is not in acute distress.    Appearance: Normal appearance. He is well-developed and normal weight. He is not ill-appearing, toxic-appearing or  diaphoretic.  HENT:     Head: Normocephalic and atraumatic.     Right Ear: External ear normal.     Left Ear: External ear normal.     Nose: Nose normal.     Mouth/Throat:     Pharynx: Oropharynx is clear.  Eyes:     General: No scleral icterus.       Right eye: No discharge.        Left eye: No discharge.     Extraocular Movements: Extraocular movements intact.     Pupils: Pupils are equal, round, and reactive to light.  Cardiovascular:     Rate and Rhythm: Normal rate.  Pulmonary:     Effort: Pulmonary effort is normal.  Genitourinary:   Musculoskeletal:     Cervical back: Normal range of motion.  Neurological:     Mental Status: He is alert and oriented to person, place, and time.  Psychiatric:        Mood and Affect: Mood normal.        Behavior: Behavior normal.        Thought Content: Thought content normal.        Judgment: Judgment normal.     Assessment and Plan :   PDMP not reviewed this encounter.  1. Balanitis   2. Unprotected sex   3. Chronic kidney disease (CKD), stage IV (severe) ([facility])     Will manage for what I suspect is balanitis with clotrimazole betamethasone, STI testing pending.  Recommended abstaining until we can discuss results with patient. Counseled patient on potential for adverse effects with medications prescribed/recommended today, ER and return-to-clinic precautions discussed, patient verbalized understanding.    Jaynee Eagles, [name] 05/12/21 1941

## 2021-05-12 NOTE — ED Triage Notes (Signed)
3 lesions on penis appearing 2 weeks prior. Patient reports soreness/aching, denies burning, drainage, or itching at site. Did have unprotected sex prior to this. Has not had anything like this before.

## 2021-05-13 LAB — RPR
RPR Ser Ql: REACTIVE — AB
RPR Titer: 1:1 {titer}

## 2021-05-15 LAB — T.PALLIDUM AB, TOTAL: T Pallidum Abs: REACTIVE — AB

## 2021-05-15 LAB — CYTOLOGY, (ORAL, ANAL, URETHRAL) ANCILLARY ONLY
Chlamydia: NEGATIVE
Comment: NEGATIVE
Comment: NEGATIVE
Comment: NORMAL
Neisseria Gonorrhea: NEGATIVE
Trichomonas: NEGATIVE

## 2021-05-16 ENCOUNTER — Telehealth: Payer: Self-pay | Admitting: Emergency Medicine

## 2021-05-16 NOTE — Telephone Encounter (Signed)
Attempted to contact patient x1 about recent positive syphilis result.  Voicemail left to return call. Titer was low, but no prior history of syphilis noted in chart. Recommending treatment for syphilis and patient to return to urgent care for IM penicillin injections.

## 2021-05-17 LAB — HSV CULTURE AND TYPING

## 2021-05-18 ENCOUNTER — Telehealth (HOSPITAL_COMMUNITY): Payer: Self-pay | Admitting: Emergency Medicine

## 2021-05-18 NOTE — Telephone Encounter (Signed)
Per Dr. Alphonzo Cruise, patient will need treatment with Bicillin 2.4 million units IM for positive Syphilis.  Contacted patient by phone.  Verified identity using two identifiers.  Provided positive result.  Reviewed safe sex practices, notifying partners, and refraining from sexual activities for 7 days from time of treatment.  Patient verified understanding, all questions answered.

## 2021-05-21 ENCOUNTER — Other Ambulatory Visit: Payer: Self-pay | Admitting: Family Medicine

## 2021-05-21 DIAGNOSIS — E039 Hypothyroidism, unspecified: Secondary | ICD-10-CM

## 2021-05-21 NOTE — Telephone Encounter (Signed)
last RF 01/16/21 #30 6 RF/// too soon

## 2021-05-25 DIAGNOSIS — A510 Primary genital syphilis: Secondary | ICD-10-CM | POA: Diagnosis not present

## 2021-05-25 DIAGNOSIS — Z113 Encounter for screening for infections with a predominantly sexual mode of transmission: Secondary | ICD-10-CM | POA: Diagnosis not present

## 2021-06-14 ENCOUNTER — Telehealth: Payer: Self-pay | Admitting: Family Medicine

## 2021-06-14 NOTE — Telephone Encounter (Signed)
..   Medicaid Managed Care   Unsuccessful Outreach Note  06/14/2021 Name: Johnny [name] MRN: [id number] DOB: [birth year]  Referred by: Charlott Rakes, MD Reason for referral : High Risk Managed Medicaid (Called [name] today to offer him a phone visit with the MM Team. He did not answer and his VM was full.)   An unsuccessful telephone outreach was attempted today. The patient was referred to the case management team for assistance with care management and care coordination.   Follow Up Plan: The care management team will reach out to the patient again over the next 7-14 days.   [place]

## 2021-06-15 ENCOUNTER — Ambulatory Visit: Payer: Medicaid Other | Attending: Physician Assistant | Admitting: Physician Assistant

## 2021-06-15 ENCOUNTER — Ambulatory Visit: Payer: Medicaid Other | Admitting: Physician Assistant

## 2021-06-15 ENCOUNTER — Other Ambulatory Visit: Payer: Self-pay

## 2021-06-15 NOTE — Progress Notes (Deleted)
Patient ID: Johnny [name], male   DOB: Aug 26, 1961, 60 y.o.   MRN: [id number]

## 2021-06-20 ENCOUNTER — Encounter: Payer: Self-pay | Admitting: Gastroenterology

## 2021-06-22 ENCOUNTER — Other Ambulatory Visit: Payer: Self-pay | Admitting: Family Medicine

## 2021-06-22 DIAGNOSIS — J432 Centrilobular emphysema: Secondary | ICD-10-CM

## 2021-06-22 NOTE — Telephone Encounter (Signed)
   Notes to clinic:  Requested script has expired  Review for continued use and refill    Requested Prescriptions  Pending Prescriptions Disp Refills   ipratropium-albuterol (DUONEB) 0.5-2.5 (3) MG/3ML SOLN [Pharmacy Med Name: Ipratropium-Albuterol 0.5-2.5 (3) MG/3ML Inhalation Solution] 90 mL 0    Sig: USE 1 AMPULE IN NEBULIZER EVERY 6 HOURS AS NEEDED     Pulmonology:  Combination Products Passed - 06/22/2021  9:08 AM      Passed - Valid encounter within last 12 months    Recent Outpatient Visits           1 week ago    [place], [place], [name]   4 months ago Strep pharyngitis   [place], [place], [name]   5 months ago Chronic kidney disease (CKD), stage V [facility])   [place], Enobong, MD   6 months ago Difficulty concentrating   [place], [place], [name]   7 months ago OSA (obstructive sleep apnea)   [place], Enobong, MD               PROAIR HFA 108 [id number] Base) MCG/ACT inhaler [Pharmacy Med Name: ProAir HFA 108 (90 Base) MCG/ACT Inhalation Aerosol Solution] 9 g 0    Sig: INHALE 1 TO 2 PUFFS BY MOUTH EVERY 6 HOURS AS NEEDED FOR WHEEZING FOR SHORTNESS OF BREATH     Pulmonology:  Beta Agonists Failed - 06/22/2021  9:08 AM      Failed - One inhaler should last at least one month. If the patient is requesting refills earlier, contact the patient to check for uncontrolled symptoms.      Passed - Valid encounter within last 12 months    Recent Outpatient Visits           1 week ago    [place], [name]   4 months ago Strep pharyngitis   [place], [place], [name]   5 months ago Chronic kidney disease (CKD), stage V [facility])   [place] Charlott Rakes, MD   6 months  ago Difficulty concentrating   [place], [name]   7 months ago OSA (obstructive sleep apnea)   [facility] Charlott Rakes, MD

## 2021-08-01 ENCOUNTER — Other Ambulatory Visit: Payer: Self-pay | Admitting: Family Medicine

## 2021-08-01 ENCOUNTER — Other Ambulatory Visit: Payer: Self-pay | Admitting: Physician Assistant

## 2021-08-01 DIAGNOSIS — I10 Essential (primary) hypertension: Secondary | ICD-10-CM

## 2021-08-01 DIAGNOSIS — E7800 Pure hypercholesterolemia, unspecified: Secondary | ICD-10-CM

## 2021-08-01 DIAGNOSIS — E039 Hypothyroidism, unspecified: Secondary | ICD-10-CM

## 2021-08-02 NOTE — Telephone Encounter (Signed)
Requested medications are due for refill today.  yes  Requested medications are on the active medications list.  yes  Last refill. 01/16/2021  Future visit scheduled.   no  Notes to clinic.  Labs are expired.

## 2021-08-07 ENCOUNTER — Telehealth: Payer: Self-pay | Admitting: Family Medicine

## 2021-08-07 NOTE — Telephone Encounter (Signed)
Pt is requesting a note that excuses him from school because he had a gout flare up, it must verify that he has gout flare ups. Requesting for this to be uploaded via [place]

## 2021-08-08 NOTE — Telephone Encounter (Signed)
Please find out what date he is requesting on the note.  Thank you

## 2021-08-09 NOTE — Telephone Encounter (Signed)
Called pt left VM to call back this nurse.

## 2021-08-23 ENCOUNTER — Other Ambulatory Visit: Payer: Self-pay | Admitting: Family Medicine

## 2021-08-23 ENCOUNTER — Telehealth: Payer: Self-pay | Admitting: Physician Assistant

## 2021-08-23 DIAGNOSIS — E7800 Pure hypercholesterolemia, unspecified: Secondary | ICD-10-CM

## 2021-08-23 DIAGNOSIS — J432 Centrilobular emphysema: Secondary | ICD-10-CM

## 2021-08-23 NOTE — Telephone Encounter (Signed)
Requested medication (s) are due for refill today:   Yes  Requested medication (s) are on the active medication list:   Yes  Future visit scheduled:   No   Last ordered: 01/16/2021 #30, 6 refills  Returned because protocol failed because lab work is due.   Requested Prescriptions  Pending Prescriptions Disp Refills   atorvastatin (LIPITOR) 40 MG tablet [Pharmacy Med Name: Atorvastatin Calcium 40 MG Oral Tablet] 30 tablet 0    Sig: Take 1 tablet by mouth once daily     Cardiovascular:  Antilipid - Statins Failed - 08/23/2021  9:27 AM      Failed - Total Cholesterol in normal range and within 360 days    Cholesterol, Total  Date Value Ref Range Status  11/10/2018 214 (H) 100 - 199 mg/dL Final          Failed - LDL in normal range and within 360 days    LDL Calculated  Date Value Ref Range Status  11/10/2018 102 (H) 0 - 99 mg/dL Final          Failed - HDL in normal range and within 360 days    HDL  Date Value Ref Range Status  11/10/2018 96 >39 mg/dL Final          Failed - Triglycerides in normal range and within 360 days    Triglycerides  Date Value Ref Range Status  11/10/2018 80 0 - 149 mg/dL Final          Passed - Patient is not pregnant      Passed - Valid encounter within last 12 months    Recent Outpatient Visits           2 months ago    [place], [name]   6 months ago Strep pharyngitis   [place], [place], [name]   7 months ago Chronic kidney disease (CKD), stage V [facility])   [place], Enobong, MD   8 months ago Difficulty concentrating   [place], [name]   9 months ago OSA (obstructive sleep apnea)   [facility] Charlott Rakes, MD

## 2021-08-23 NOTE — Telephone Encounter (Signed)
Requested medication (s) are due for refill today - yes  Requested medication (s) are on the active medication list -yes  Future visit scheduled -no  Last refill: 06/22/21  Notes to clinic: Request RF: last RF has notes- patient overdue OV.  Requested Prescriptions  Pending Prescriptions Disp Refills   PROAIR HFA 108 (90 Base) MCG/ACT inhaler [Pharmacy Med Name: ProAir HFA 108 (90 Base) MCG/ACT Inhalation Aerosol Solution] 9 g 0    Sig: INHALE 1 TO 2 PUFFS BY MOUTH EVERY 6 HOURS AS NEEDED FOR WHEEZING FOR SHORTNESS OF BREATH. MUST HAVE OFFICE VISIT FOR REFILLS.     Pulmonology:  Beta Agonists Failed - 08/23/2021  9:27 AM      Failed - One inhaler should last at least one month. If the patient is requesting refills earlier, contact the patient to check for uncontrolled symptoms.      Passed - Valid encounter within last 12 months    Recent Outpatient Visits           2 months ago    [place], [name]   6 months ago Strep pharyngitis   [place], [place], [name]   7 months ago Chronic kidney disease (CKD), stage V [facility])   [employer], Enobong, MD   8 months ago Difficulty concentrating   [place], Levada Dy M, [name]   9 months ago OSA (obstructive sleep apnea)   [place], Enobong, MD                 Requested Prescriptions  Pending Prescriptions Disp Refills   PROAIR HFA 108 (90 Base) MCG/ACT inhaler [Pharmacy Med Name: ProAir HFA 108 (90 Base) MCG/ACT Inhalation Aerosol Solution] 9 g 0    Sig: INHALE 1 TO 2 PUFFS BY MOUTH EVERY 6 HOURS AS NEEDED FOR WHEEZING FOR SHORTNESS OF BREATH. MUST HAVE OFFICE VISIT FOR REFILLS.     Pulmonology:  Beta Agonists Failed - 08/23/2021  9:27 AM      Failed - One inhaler should last at least one month. If the patient is requesting refills  earlier, contact the patient to check for uncontrolled symptoms.      Passed - Valid encounter within last 12 months    Recent Outpatient Visits           2 months ago    [place], [name]   6 months ago Strep pharyngitis   [place], [place], [name]   7 months ago Chronic kidney disease (CKD), stage V [facility])   [place], Enobong, MD   8 months ago Difficulty concentrating   [place], [name]   9 months ago OSA (obstructive sleep apnea)   [facility] Charlott Rakes, MD

## 2021-08-29 ENCOUNTER — Telehealth: Payer: Self-pay | Admitting: Family Medicine

## 2021-08-29 NOTE — Telephone Encounter (Signed)
..   Medicaid Managed Care   Unsuccessful Outreach Note  08/29/2021 Name: Johnny [name] MRN: [id number] DOB: [birth year]  Referred by: Charlott Rakes, MD Reason for referral : High Risk Managed Medicaid (I called the patient today to get him scheduled for a phone visit with the [facility] Team. VM was full.)   A second unsuccessful telephone outreach was attempted today. The patient was referred to the case management team for assistance with care management and care coordination.   Follow Up Plan: The care management team will reach out to the patient again over the next 7-14 days.   [place], [place]

## 2021-08-30 NOTE — Telephone Encounter (Signed)
Patient called in I scheduled appt for 11/11/21. Can this medicine be filled until his appt? Please call patient back

## 2021-08-31 MED ORDER — ATORVASTATIN CALCIUM 40 MG PO TABS: 40.0000 mg | ORAL_TABLET | Freq: Every day | ORAL | 2 refills | Status: DC

## 2021-08-31 NOTE — Addendum Note (Signed)
Addended by: Daisy Blossom, Annie Main L on: 08/31/2021 04:58 PM   Modules accepted: Orders

## 2021-08-31 NOTE — Telephone Encounter (Signed)
Rx sent 

## 2021-09-12 ENCOUNTER — Other Ambulatory Visit: Payer: Self-pay | Admitting: Family Medicine

## 2021-09-12 DIAGNOSIS — J432 Centrilobular emphysema: Secondary | ICD-10-CM

## 2021-09-12 NOTE — Telephone Encounter (Signed)
Requested Prescriptions  Pending Prescriptions Disp Refills  . PROAIR HFA 108 (90 Base) MCG/ACT inhaler [Pharmacy Med Name: ProAir HFA 108 (90 Base) MCG/ACT Inhalation Aerosol Solution] 9 g 1    Sig: INHALE 1 TO 2 PUFFS BY MOUTH EVERY 6 HOURS AS NEEDED FOR WHEEZING FOR SHORTNESS OF BREATH. MUST HAVE OFFICE VISIT FOR REFILLS.     Pulmonology:  Beta Agonists Failed - 09/12/2021 10:55 AM      Failed - One inhaler should last at least one month. If the patient is requesting refills earlier, contact the patient to check for uncontrolled symptoms.      Passed - Valid encounter within last 12 months    Recent Outpatient Visits          2 months ago    [place], [name]   7 months ago Strep pharyngitis   [place], [place], [name]   7 months ago Chronic kidney disease (CKD), stage V [facility])   [place], Enobong, MD   9 months ago Difficulty concentrating   [place], [name]   9 months ago OSA (obstructive sleep apnea)   [place], [place]      Future Appointments            In 2 months Charlott Rakes, MD [place]

## 2021-09-14 ENCOUNTER — Telehealth (INDEPENDENT_AMBULATORY_CARE_PROVIDER_SITE_OTHER): Payer: Self-pay

## 2021-09-14 DIAGNOSIS — Z114 Encounter for screening for human immunodeficiency virus [HIV]: Secondary | ICD-10-CM | POA: Diagnosis not present

## 2021-09-14 DIAGNOSIS — Z113 Encounter for screening for infections with a predominantly sexual mode of transmission: Secondary | ICD-10-CM | POA: Diagnosis not present

## 2021-09-14 DIAGNOSIS — A549 Gonococcal infection, unspecified: Secondary | ICD-10-CM | POA: Diagnosis not present

## 2021-09-14 NOTE — Telephone Encounter (Signed)
Pt is requesting letter from missing work on 09/12/21

## 2021-09-14 NOTE — Telephone Encounter (Signed)
Copied from [place] [id number]. Topic: General - Other >> Sep 12, 2021 11:20 AM Leward Quan A wrote: Reason for CRM: Patient called in to inform Dr Margarita Rana that he had a gout flare up and had to miss work/ school. Say that he need a note for school to be excused from today. Please advise call  Ph# [phone]

## 2021-09-15 NOTE — Telephone Encounter (Signed)
Call placed to patient and VM was left informing patient that letter has been sent via his mychart.

## 2021-09-15 NOTE — Telephone Encounter (Signed)
Sent to him via [place].

## 2021-09-25 ENCOUNTER — Encounter: Payer: Self-pay | Admitting: Family Medicine

## 2021-09-27 ENCOUNTER — Other Ambulatory Visit: Payer: Self-pay | Admitting: Family Medicine

## 2021-09-27 DIAGNOSIS — E039 Hypothyroidism, unspecified: Secondary | ICD-10-CM

## 2021-09-27 NOTE — Telephone Encounter (Signed)
Last ordered 08/02/21 # with 2 refills. Patient should have medication thru 10/29/21. Future OV 11/21/21.  Most recent TSH 11/08/20. Patient requesting refill too soon.

## 2021-10-19 ENCOUNTER — Other Ambulatory Visit: Payer: Self-pay | Admitting: Family Medicine

## 2021-10-19 DIAGNOSIS — I10 Essential (primary) hypertension: Secondary | ICD-10-CM

## 2021-10-19 NOTE — Telephone Encounter (Signed)
Pt called saying his inhaler was disontinued and the pharmacy does not carry it anymore. He wants to know if something else can be sent in  CB#  [place]

## 2021-11-10 ENCOUNTER — Telehealth: Payer: Self-pay | Admitting: Family Medicine

## 2021-11-12 ENCOUNTER — Other Ambulatory Visit: Payer: Self-pay | Admitting: Family Medicine

## 2021-11-12 DIAGNOSIS — E039 Hypothyroidism, unspecified: Secondary | ICD-10-CM

## 2021-11-12 NOTE — Telephone Encounter (Signed)
Requested medication (s) are due for refill today: yes  Requested medication (s) are on the active medication list: yes  Last refill:  08/02/21 #30 2 RF  Future visit scheduled: yes  Notes to clinic:  overdue lab work   Requested Prescriptions  Pending Prescriptions Disp Refills   levothyroxine (SYNTHROID) 100 MCG tablet [Pharmacy Med Name: Levothyroxine Sodium 100 MCG Oral Tablet] 90 tablet 0    Sig: Take 1 tablet by mouth once daily     Endocrinology:  Hypothyroid Agents Failed - 11/12/2021  4:44 PM      Failed - TSH needs to be rechecked within 3 months after an abnormal result. Refill until TSH is due.      Failed - TSH in normal range and within 360 days    TSH  Date Value Ref Range Status  11/08/2020 3.390 0.450 - 4.500 uIU/mL Final          Passed - Valid encounter within last 12 months    Recent Outpatient Visits           5 months ago    [place], [place], [name]   9 months ago Strep pharyngitis   [place], Levada Dy M, [name]   10 months ago Chronic kidney disease (CKD), stage V [facility])   [place] Charlott Rakes, MD   11 months ago Difficulty concentrating   [place], [name]   11 months ago OSA (obstructive sleep apnea)   [place], [place]       Future Appointments             In 1 week Charlott Rakes, MD [place]

## 2021-11-13 ENCOUNTER — Other Ambulatory Visit: Payer: Self-pay | Admitting: Family Medicine

## 2021-11-13 ENCOUNTER — Other Ambulatory Visit: Payer: Self-pay | Admitting: Physician Assistant

## 2021-11-13 DIAGNOSIS — I10 Essential (primary) hypertension: Secondary | ICD-10-CM

## 2021-11-13 NOTE — Telephone Encounter (Signed)
Requested medication (s) are due for refill today:   Yes  Requested medication (s) are on the active medication list:   Yes  Future visit scheduled:   Yes in 1 wk with [place]   Last ordered: 08/02/2021 #90, 0 refills  Returned for provider review for refills prior to appt per note.     Requested Prescriptions  Pending Prescriptions Disp Refills   ipratropium-albuterol (DUONEB) 0.5-2.5 (3) MG/3ML SOLN [Pharmacy Med Name: Ipratropium-Albuterol 0.5-2.5 (3) MG/3ML Inhalation Solution] 90 mL 0    Sig: USE 1 AMPULE IN NEBULIZER EVERY 6 HOURS AS NEEDED     Pulmonology:  Combination Products Passed - 11/13/2021  9:14 AM      Passed - Valid encounter within last 12 months    Recent Outpatient Visits           5 months ago    [place], [place], [name]   9 months ago Strep pharyngitis   [place], Levada Dy M, [name]   10 months ago Chronic kidney disease (CKD), stage V [facility])   [place] Charlott Rakes, MD   11 months ago Difficulty concentrating   [place], [name]   11 months ago OSA (obstructive sleep apnea)   [place], MD       Future Appointments             In 1 week Charlott Rakes, MD [place]

## 2021-11-13 NOTE — Telephone Encounter (Signed)
Requested medication (s) are due for refill today - yes  Requested medication (s) are on the active medication list -yes  Future visit scheduled -yes  Last refill: 08/02/21 #30  Notes to clinic: Request RF: last request 08/02/21 has notes- needs visit for RF- sent for review   Requested Prescriptions  Pending Prescriptions Disp Refills   amLODipine (NORVASC) 10 MG tablet [Pharmacy Med Name: amLODIPine Besylate 10 MG Oral Tablet] 30 tablet 0    Sig: Take 1 tablet by mouth once daily     Cardiovascular:  Calcium Channel Blockers Failed - 11/13/2021  9:15 AM      Failed - Last BP in normal range    BP Readings from Last 1 Encounters:  05/12/21 (!) 150/91          Failed - Valid encounter within last 6 months    Recent Outpatient Visits           5 months ago    [place], [place], [name]   9 months ago Strep pharyngitis   [place], Levada Dy [place], [name]   10 months ago Chronic kidney disease (CKD), stage V [name])   [place], Charlane Ferretti, MD   11 months ago Difficulty concentrating   [place], Levada Dy M, [name]   11 months ago OSA (obstructive sleep apnea)   [place], Charlane Ferretti, MD       Future Appointments             In 1 week Charlott Rakes, MD [place]               Requested Prescriptions  Pending Prescriptions Disp Refills   amLODipine (NORVASC) 10 MG tablet [Pharmacy Med Name: amLODIPine Besylate 10 MG Oral Tablet] 30 tablet 0    Sig: Take 1 tablet by mouth once daily     Cardiovascular:  Calcium Channel Blockers Failed - 11/13/2021  9:15 AM      Failed - Last BP in normal range    BP Readings from Last 1 Encounters:  05/12/21 (!) 150/91          Failed - Valid encounter within last 6 months    Recent Outpatient Visits           5  months ago    [place], [place], [name]   9 months ago Strep pharyngitis   [place], [place], [name]   10 months ago Chronic kidney disease (CKD), stage V [facility])   [place], Enobong, MD   11 months ago Difficulty concentrating   [place], [name]   11 months ago OSA (obstructive sleep apnea)   [place], MD       Future Appointments             In 1 week Charlott Rakes, MD [place]

## 2021-11-21 ENCOUNTER — Ambulatory Visit: Payer: Medicaid Other | Admitting: Family Medicine

## 2021-11-28 ENCOUNTER — Inpatient Hospital Stay (HOSPITAL_COMMUNITY)
Admission: EM | Admit: 2021-11-28 | Discharge: 2021-12-01 | DRG: 291 | Disposition: A | Discharge status: Home / Self Care | Payer: Medicaid Other | Attending: Internal Medicine | Admitting: Internal Medicine

## 2021-11-28 ENCOUNTER — Emergency Department (HOSPITAL_COMMUNITY): Payer: Medicaid Other

## 2021-11-28 ENCOUNTER — Ambulatory Visit
Admission: EM | Admit: 2021-11-28 | Discharge: 2021-11-28 | Disposition: A | Discharge status: Other Acute Inpatient Hospital | Payer: Medicaid Other | Attending: Internal Medicine | Admitting: Internal Medicine

## 2021-11-28 ENCOUNTER — Other Ambulatory Visit: Payer: Self-pay

## 2021-11-28 ENCOUNTER — Encounter (HOSPITAL_COMMUNITY): Payer: Self-pay | Admitting: Emergency Medicine

## 2021-11-28 ENCOUNTER — Ambulatory Visit: Payer: Self-pay

## 2021-11-28 ENCOUNTER — Encounter: Payer: Self-pay | Admitting: Emergency Medicine

## 2021-11-28 DIAGNOSIS — Z86010 Personal history of colonic polyps: Secondary | ICD-10-CM

## 2021-11-28 DIAGNOSIS — J449 Chronic obstructive pulmonary disease, unspecified: Secondary | ICD-10-CM | POA: Diagnosis present

## 2021-11-28 DIAGNOSIS — Z20822 Contact with and (suspected) exposure to covid-19: Secondary | ICD-10-CM | POA: Diagnosis present

## 2021-11-28 DIAGNOSIS — I132 Hypertensive heart and chronic kidney disease with heart failure and with stage 5 chronic kidney disease, or end stage renal disease: Principal | ICD-10-CM | POA: Diagnosis present

## 2021-11-28 DIAGNOSIS — Z888 Allergy status to other drugs, medicaments and biological substances status: Secondary | ICD-10-CM

## 2021-11-28 DIAGNOSIS — Z79899 Other long term (current) drug therapy: Secondary | ICD-10-CM

## 2021-11-28 DIAGNOSIS — I10 Essential (primary) hypertension: Secondary | ICD-10-CM

## 2021-11-28 DIAGNOSIS — N184 Chronic kidney disease, stage 4 (severe): Secondary | ICD-10-CM

## 2021-11-28 DIAGNOSIS — R062 Wheezing: Secondary | ICD-10-CM | POA: Diagnosis not present

## 2021-11-28 DIAGNOSIS — Z5900 Homelessness unspecified: Secondary | ICD-10-CM

## 2021-11-28 DIAGNOSIS — J441 Chronic obstructive pulmonary disease with (acute) exacerbation: Secondary | ICD-10-CM

## 2021-11-28 DIAGNOSIS — N179 Acute kidney failure, unspecified: Secondary | ICD-10-CM

## 2021-11-28 DIAGNOSIS — G4733 Obstructive sleep apnea (adult) (pediatric): Secondary | ICD-10-CM | POA: Diagnosis present

## 2021-11-28 DIAGNOSIS — I110 Hypertensive heart disease with heart failure: Secondary | ICD-10-CM | POA: Diagnosis not present

## 2021-11-28 DIAGNOSIS — Z87891 Personal history of nicotine dependence: Secondary | ICD-10-CM

## 2021-11-28 DIAGNOSIS — I5033 Acute on chronic diastolic (congestive) heart failure: Secondary | ICD-10-CM | POA: Diagnosis present

## 2021-11-28 DIAGNOSIS — N185 Chronic kidney disease, stage 5: Secondary | ICD-10-CM | POA: Diagnosis present

## 2021-11-28 DIAGNOSIS — R0602 Shortness of breath: Secondary | ICD-10-CM

## 2021-11-28 DIAGNOSIS — I517 Cardiomegaly: Secondary | ICD-10-CM | POA: Diagnosis not present

## 2021-11-28 DIAGNOSIS — J432 Centrilobular emphysema: Secondary | ICD-10-CM

## 2021-11-28 DIAGNOSIS — Z79890 Hormone replacement therapy (postmenopausal): Secondary | ICD-10-CM

## 2021-11-28 DIAGNOSIS — E039 Hypothyroidism, unspecified: Secondary | ICD-10-CM

## 2021-11-28 DIAGNOSIS — Z28310 Unvaccinated for covid-19: Secondary | ICD-10-CM

## 2021-11-28 DIAGNOSIS — D631 Anemia in chronic kidney disease: Secondary | ICD-10-CM | POA: Diagnosis present

## 2021-11-28 DIAGNOSIS — Z8249 Family history of ischemic heart disease and other diseases of the circulatory system: Secondary | ICD-10-CM

## 2021-11-28 DIAGNOSIS — I509 Heart failure, unspecified: Secondary | ICD-10-CM

## 2021-11-28 DIAGNOSIS — E7800 Pure hypercholesterolemia, unspecified: Secondary | ICD-10-CM

## 2021-11-28 LAB — BASIC METABOLIC PANEL
Anion gap: 9 (ref 5–15)
BUN: 42 mg/dL — ABNORMAL HIGH (ref 6–20)
CO2: 21 mmol/L — ABNORMAL LOW (ref 22–32)
Calcium: 7.8 mg/dL — ABNORMAL LOW (ref 8.9–10.3)
Chloride: 110 mmol/L (ref 98–111)
Creatinine, Ser: 6.04 mg/dL — ABNORMAL HIGH (ref 0.61–1.24)
GFR, Estimated: 10 mL/min — ABNORMAL LOW (ref >60)
Glucose, Bld: 94 mg/dL (ref 70–99)
Potassium: 5.1 mmol/L (ref 3.5–5.1)
Sodium: 140 mmol/L (ref 135–145)

## 2021-11-28 LAB — CBC WITH DIFFERENTIAL/PLATELET
Abs Immature Granulocytes: 0.02 10*3/uL (ref 0.00–0.07)
Basophils Absolute: 0.1 10*3/uL (ref 0.0–0.1)
Basophils Relative: 1 %
Eosinophils Absolute: 0.4 10*3/uL (ref 0.0–0.5)
Eosinophils Relative: 4 %
HCT: 28 % — ABNORMAL LOW (ref 39.0–52.0)
Hemoglobin: 9.4 g/dL — ABNORMAL LOW (ref 13.0–17.0)
Immature Granulocytes: 0 %
Lymphocytes Relative: 26 %
Lymphs Abs: 2.2 10*3/uL (ref 0.7–4.0)
MCH: 24.9 pg — ABNORMAL LOW (ref 26.0–34.0)
MCHC: 33.6 g/dL (ref 30.0–36.0)
MCV: 74.3 fL — ABNORMAL LOW (ref 80.0–100.0)
Monocytes Absolute: 0.6 10*3/uL (ref 0.1–1.0)
Monocytes Relative: 7 %
Neutro Abs: 5.2 10*3/uL (ref 1.7–7.7)
Neutrophils Relative %: 62 %
Platelets: 214 10*3/uL (ref 150–400)
RBC: 3.77 MIL/uL — ABNORMAL LOW (ref 4.22–5.81)
RDW: 17.9 % — ABNORMAL HIGH (ref 11.5–15.5)
WBC: 8.4 10*3/uL (ref 4.0–10.5)
nRBC: 0 % (ref 0.0–0.2)

## 2021-11-28 LAB — BRAIN NATRIURETIC PEPTIDE: B Natriuretic Peptide: 1296.1 pg/mL — ABNORMAL HIGH (ref 0.0–100.0)

## 2021-11-28 LAB — RESP PANEL BY RT-PCR (FLU A&B, COVID) ARPGX2
Influenza A by PCR: NEGATIVE
Influenza B by PCR: NEGATIVE
SARS Coronavirus 2 by RT PCR: NEGATIVE

## 2021-11-28 MED ORDER — ALBUTEROL SULFATE (2.5 MG/3ML) 0.083% IN NEBU
2.5000 mg | INHALATION_SOLUTION | Freq: Once | RESPIRATORY_TRACT | Status: AC
  Administered 2021-11-28: 20:00:00 2.5 mg via RESPIRATORY_TRACT

## 2021-11-28 NOTE — ED Triage Notes (Signed)
Patient c/o SOB episodes x 2 days, worsened last night, history of COPD.  Denies cough and congestion.

## 2021-11-28 NOTE — Discharge Instructions (Signed)
Patient sent to hospital via EMS.  

## 2021-11-28 NOTE — Telephone Encounter (Signed)
°  Chief Complaint: SOB med refill Symptoms: SOB Frequency: last night Pertinent Negatives: Patient denies currently being SOB Disposition: [] ED /[x] Urgent Care (no appt availability in office) / [] Appointment(In office/virtual)/ []  [facility]/ [] Home Care/ [] Refused Recommended Disposition /[] [place]/ []  Follow-up with PCP Additional Notes: Pt preferred to go to UC this evening so he doesn't have SOB episodes again tonight. Pt is wanting albuterol inhaler instead of the duonebs. He states he has sent messages but no one has let him know anything   Reason for Disposition  [1] Prescription refill request for ESSENTIAL medicine (i.e., likelihood of harm to patient if not taken) AND [2] triager unable to refill per department policy  Answer Assessment - Initial Assessment Questions 1. DRUG NAME: "What medicine do you need to have refilled?"     Albuterol inhaler 2. REFILLS REMAINING: "How many refills are remaining?" (Note: The label on the medicine or pill bottle will show how many refills are remaining. If there are no refills remaining, then a renewal may be needed.)     0 3. EXPIRATION DATE: "What is the expiration date?" (Note: The label states when the prescription will expire, and thus can no longer be refilled.)     0 4. PRESCRIBING HCP: "Who prescribed it?" Reason: If prescribed by specialist, call should be referred to that group.     Dr. Margarita Rana 5. SYMPTOMS: "Do you have any symptoms?"     SOB  Protocols used: Medication Refill and Renewal Call-A-AH

## 2021-11-28 NOTE — ED Triage Notes (Signed)
Patient arrived with EMS from urgent care reports SOB for several days , history of COPD , he received Solumedrol 125 mg IV and Albuterol 5 mg nebulizer by EMS prior to arrival .

## 2021-11-28 NOTE — ED Provider Triage Note (Signed)
Emergency Medicine Provider Triage Evaluation Note  Johnny [name] , a 61 y.o. male  was evaluated in triage.  Pt complains of SOB x2 days. History of COPD. Patient was seen at [facility] prior to arrival and sent to the ED due to concerns about COPD exacerbation. Patient denies CP. He was given solumedrol and nebulizer treatment by EMS.   Review of Systems  Positive: SOB Negative: CP  Physical Exam  BP (!) 147/100 (BP Location: Right Arm)    Pulse 72    Temp 98.2 F (36.8 C) (Oral)    Resp (!) 22    SpO2 98%  Gen:   Awake, no distress   Resp:  Normal effort  MSK:   Moves extremities without difficulty  Other:    Medical Decision Making  Medically screening exam initiated at 8:49 PM.  Appropriate orders placed.  Johnny [name] was informed that the remainder of the evaluation will be completed by another provider, this initial triage assessment does not replace that evaluation, and the importance of remaining in the ED until their evaluation is complete.  Labs CXR COVID/influenza   Suzy Bouchard, [name] 11/28/21 2050

## 2021-11-28 NOTE — ED Provider Notes (Signed)
[facility]    CSN: [id number] Arrival date & time: 11/28/21  1809      History   Chief Complaint Chief Complaint  Patient presents with   Shortness of Breath    HPI Johnny [name] is a 61 y.o. male.   Patient presents with intermittent shortness of breath that has been present for the past 2 days.  No aggravating or relieving factors to shortness of breath.  Patient reports that he does have history of COPD but this "feels different".  He takes albuterol for shortness of breath as needed but does not have any albuterol at this time.  He also has a history of end-stage kidney disease as well as congestive heart failure.  Denies chest pain, upper respiratory symptoms, cough, fever.  Denies any known sick contacts.   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   Chronic diastolic congestive heart failure ([place]) 11/10/2018   Hyperlipidemia 11/10/2018   Anxiety and depression 02/12/2018   History of tobacco use disorder 12/07/2016   Gout 12/07/2016   COPD (chronic obstructive pulmonary disease) ([place]) 06/07/2016   CKD (chronic kidney disease) stage 4, GFR 15-29 ml/min ([place]) 06/07/2016   Essential hypertension 06/07/2016   OSA (obstructive sleep apnea) 11/03/2015    Past Surgical History:  Procedure Laterality Date   AV FISTULA PLACEMENT  09/12/2009   Left arm AVF       Home Medications    Prior to Admission medications   Medication  Sig Start Date End Date Taking? Authorizing Provider  allopurinol (ZYLOPRIM) 100 MG tablet Take 2 tablets (200 mg total) by mouth daily. 11/08/20  Yes Freeman Caldron M, PA-C  amLODipine (NORVASC) 10 MG tablet Take 1 tablet by mouth once daily 11/13/21  Yes [place], Enobong, MD  atorvastatin (LIPITOR) 40 MG tablet Take 1 tablet (40 mg total) by mouth daily. 08/31/21  Yes Charlott Rakes, MD  Blood Pressure Monitoring (3 SERIES BP MONITOR/UPPER ARM) DEVI 1 Units by Does not apply route 2 (two) times daily. 10/13/19  Yes Elsie Stain, MD  clotrimazole-betamethasone (LOTRISONE) cream Apply to affected area 2 times daily. 05/12/21  Yes Jaynee Eagles, PA-C  Colchicine (MITIGARE) 0.6 MG CAPS Take 2 capsules (1.2 mg) orally at the onset of a gout flare then repeat 1 capsule (0.6 mg) in 1 hour if pain continues. 01/10/21  Yes Charlott Rakes, MD  labetalol (NORMODYNE) 200 MG tablet TAKE 1 TABLET BY MOUTH THREE TIMES DAILY 10/19/21  Yes Charlott Rakes, MD  Misc. Devices MISC Blood Pressure monitor. Dx: HTN 11/25/19  Yes [place], Enobong, MD  triamcinolone (KENALOG) 0.1 % Apply 1 application topically 2 (two) times daily. 11/09/20  Yes Charlott Rakes, MD  amoxicillin-clavulanate (AUGMENTIN) 875-125 MG tablet Take 1 tablet by mouth 2 (two) times daily. 01/26/21   Argentina Donovan, PA-C  furosemide (LASIX) 20 MG tablet Take 1 tablet (20 mg total) by mouth  daily as needed for edema. 11/08/20   Argentina Donovan, PA-C  ipratropium-albuterol (DUONEB) 0.5-2.5 (3) MG/3ML SOLN USE 1 AMPULE IN NEBULIZER EVERY 6 HOURS AS NEEDED 11/13/21   Charlott Rakes, MD  levothyroxine (SYNTHROID) 100 MCG tablet Take 1 tablet by mouth once daily 11/13/21   Charlott Rakes, MD  mometasone-formoterol (DULERA) 100-5 MCG/ACT AERO Inhale 2 puffs into the lungs 2 (two) times daily. 01/16/21   Charlott Rakes, MD  predniSONE (DELTASONE) 20 MG tablet Take 1 tablet (20 mg total) by mouth daily with breakfast. 01/16/21   Charlott Rakes, MD  PROAIR HFA 108  (90 Base) MCG/ACT inhaler INHALE 1 TO 2 PUFFS BY MOUTH EVERY 6 HOURS AS NEEDED FOR WHEEZING FOR SHORTNESS OF BREATH. MUST HAVE OFFICE VISIT FOR REFILLS. 09/12/21   Charlott Rakes, MD    Family History Family History  Problem Relation Age of Onset   Hypertension Mother    Diabetes Mother    Colon cancer Neg Hx     Social History Social History   Tobacco Use   Smoking status: Former    Packs/day: 0.10    Types: Cigarettes    Quit date: 04/02/2011    Years since quitting: 10.6   Smokeless tobacco: Never   Tobacco comments:    trying to quit  Vaping Use   Vaping Use: Never used  Substance Use Topics   Alcohol use: Never    Alcohol/week: 0.0 standard drinks   Drug use: No     Allergies   Naproxen   Review of Systems Review of Systems Per HPI  Physical Exam Triage Vital Signs ED Triage Vitals  Enc Vitals Group     BP 11/28/21 1905 (!) 154/82     Pulse Rate 11/28/21 1905 79     Resp 11/28/21 1905 (!) 25     Temp 11/28/21 1905 98.4 F (36.9 C)     Temp Source 11/28/21 1905 Oral     SpO2 11/28/21 1905 96 %     Weight 11/28/21 1908 190 lb (86.2 kg)     Height 11/28/21 1908 6\' 1"  (1.854 m)     Head Circumference --      Peak Flow --      Pain Score 11/28/21 1907 0     Pain Loc --      Pain Edu? --      Excl. in [place]? --    No data found.  Updated Vital Signs BP (!) 154/82 (BP Location: Right Arm)    Pulse 79    Temp 98.4 F (36.9 C) (Oral)    Resp (!) 25    Ht 6\' 1"  (1.854 m)    Wt 190 lb (86.2 kg)    SpO2 96%    BMI 25.07 kg/m   Visual Acuity Right Eye Distance:   Left Eye Distance:   Bilateral Distance:    Right Eye Near:   Left Eye Near:    Bilateral Near:     Physical Exam Constitutional:      General: He is not in acute distress.    Appearance: Normal appearance. He is not toxic-appearing or diaphoretic.  HENT:     Head: Normocephalic and atraumatic.  Eyes:     Extraocular Movements: Extraocular movements intact.     Conjunctiva/sclera:  Conjunctivae normal.  Cardiovascular:     Rate and Rhythm: Normal rate and regular rhythm.  Pulmonary:     Breath sounds: Wheezing present.     Comments: Tachypnea.  Patient having difficulty  speaking in complete sentences.  Audible wheezing on exam. Neurological:     General: No focal deficit present.     Mental Status: He is alert and oriented to person, place, and time. Mental status is at baseline.  Psychiatric:        Mood and Affect: Mood normal.        Behavior: Behavior normal.        Thought Content: Thought content normal.        Judgment: Judgment normal.     UC Treatments / Results  Labs (all labs ordered are listed, but only abnormal results are displayed) Labs Reviewed - No data to display  EKG   Radiology No results found.  Procedures Procedures (including critical care time)  Medications Ordered in UC Medications  albuterol (PROVENTIL) (2.5 MG/3ML) 0.083% nebulizer solution 2.5 mg (2.5 mg Nebulization Given 11/28/21 1933)    Initial Impression / Assessment and Plan / UC Course  I have reviewed the triage vital signs and the nursing notes.  Pertinent labs & imaging results that were available during my care of the patient were reviewed by me and considered in my medical decision making (see chart for details).     Albuterol nebulizer solution given in urgent care due to audible wheezing on exam.  I do think the patient needs more extensive evaluation than can be provided at the urgent care given tachypnea and audible wheezing.  Patient does have multiple comorbidities that could be causing shortness of breath, although it is most likely due to COPD exacerbation given presence of wheezing.  Patient sent to hospital via EMS.  Patient was agreeable with plan. Final Clinical Impressions(s) / UC Diagnoses   Final diagnoses:  Shortness of breath  Wheezing     Discharge Instructions      Patient sent to hospital via EMS.    ED Prescriptions   None     PDMP not reviewed this encounter.   Teodora Medici, [place] 11/28/21 1944

## 2021-11-29 ENCOUNTER — Telehealth: Payer: Self-pay

## 2021-11-29 ENCOUNTER — Observation Stay (HOSPITAL_COMMUNITY): Payer: Medicaid Other

## 2021-11-29 ENCOUNTER — Telehealth: Payer: Self-pay | Admitting: Family Medicine

## 2021-11-29 DIAGNOSIS — R0602 Shortness of breath: Secondary | ICD-10-CM | POA: Diagnosis not present

## 2021-11-29 DIAGNOSIS — I509 Heart failure, unspecified: Secondary | ICD-10-CM | POA: Diagnosis not present

## 2021-11-29 DIAGNOSIS — J432 Centrilobular emphysema: Secondary | ICD-10-CM

## 2021-11-29 DIAGNOSIS — N184 Chronic kidney disease, stage 4 (severe): Secondary | ICD-10-CM

## 2021-11-29 DIAGNOSIS — I10 Essential (primary) hypertension: Secondary | ICD-10-CM

## 2021-11-29 DIAGNOSIS — Z28310 Unvaccinated for covid-19: Secondary | ICD-10-CM | POA: Diagnosis not present

## 2021-11-29 DIAGNOSIS — E8779 Other fluid overload: Secondary | ICD-10-CM | POA: Diagnosis not present

## 2021-11-29 DIAGNOSIS — G4733 Obstructive sleep apnea (adult) (pediatric): Secondary | ICD-10-CM | POA: Diagnosis not present

## 2021-11-29 DIAGNOSIS — Z8249 Family history of ischemic heart disease and other diseases of the circulatory system: Secondary | ICD-10-CM | POA: Diagnosis not present

## 2021-11-29 DIAGNOSIS — Z79890 Hormone replacement therapy (postmenopausal): Secondary | ICD-10-CM | POA: Diagnosis not present

## 2021-11-29 DIAGNOSIS — Z79899 Other long term (current) drug therapy: Secondary | ICD-10-CM | POA: Diagnosis not present

## 2021-11-29 DIAGNOSIS — D649 Anemia, unspecified: Secondary | ICD-10-CM | POA: Diagnosis not present

## 2021-11-29 DIAGNOSIS — I5033 Acute on chronic diastolic (congestive) heart failure: Secondary | ICD-10-CM

## 2021-11-29 DIAGNOSIS — I110 Hypertensive heart disease with heart failure: Secondary | ICD-10-CM | POA: Diagnosis not present

## 2021-11-29 DIAGNOSIS — J441 Chronic obstructive pulmonary disease with (acute) exacerbation: Secondary | ICD-10-CM | POA: Diagnosis not present

## 2021-11-29 DIAGNOSIS — Z86010 Personal history of colonic polyps: Secondary | ICD-10-CM | POA: Diagnosis not present

## 2021-11-29 DIAGNOSIS — E039 Hypothyroidism, unspecified: Secondary | ICD-10-CM | POA: Diagnosis not present

## 2021-11-29 DIAGNOSIS — N185 Chronic kidney disease, stage 5: Secondary | ICD-10-CM | POA: Diagnosis not present

## 2021-11-29 DIAGNOSIS — I132 Hypertensive heart and chronic kidney disease with heart failure and with stage 5 chronic kidney disease, or end stage renal disease: Secondary | ICD-10-CM | POA: Diagnosis not present

## 2021-11-29 DIAGNOSIS — Z87891 Personal history of nicotine dependence: Secondary | ICD-10-CM | POA: Diagnosis not present

## 2021-11-29 DIAGNOSIS — Z20822 Contact with and (suspected) exposure to covid-19: Secondary | ICD-10-CM | POA: Diagnosis not present

## 2021-11-29 DIAGNOSIS — J449 Chronic obstructive pulmonary disease, unspecified: Secondary | ICD-10-CM | POA: Diagnosis not present

## 2021-11-29 DIAGNOSIS — E872 Acidosis, unspecified: Secondary | ICD-10-CM | POA: Diagnosis not present

## 2021-11-29 DIAGNOSIS — I517 Cardiomegaly: Secondary | ICD-10-CM | POA: Diagnosis not present

## 2021-11-29 DIAGNOSIS — N179 Acute kidney failure, unspecified: Secondary | ICD-10-CM | POA: Diagnosis not present

## 2021-11-29 DIAGNOSIS — D631 Anemia in chronic kidney disease: Secondary | ICD-10-CM | POA: Diagnosis not present

## 2021-11-29 DIAGNOSIS — I120 Hypertensive chronic kidney disease with stage 5 chronic kidney disease or end stage renal disease: Secondary | ICD-10-CM | POA: Diagnosis not present

## 2021-11-29 DIAGNOSIS — Z888 Allergy status to other drugs, medicaments and biological substances status: Secondary | ICD-10-CM | POA: Diagnosis not present

## 2021-11-29 LAB — I-STAT CHEM 8, ED
BUN: 43 mg/dL — ABNORMAL HIGH (ref 6–20)
Calcium, Ion: 1.02 mmol/L — ABNORMAL LOW (ref 1.15–1.40)
Chloride: 110 mmol/L (ref 98–111)
Creatinine, Ser: 6.8 mg/dL — ABNORMAL HIGH (ref 0.61–1.24)
Glucose, Bld: 165 mg/dL — ABNORMAL HIGH (ref 70–99)
HCT: 29 % — ABNORMAL LOW (ref 39.0–52.0)
Hemoglobin: 9.9 g/dL — ABNORMAL LOW (ref 13.0–17.0)
Potassium: 5.1 mmol/L (ref 3.5–5.1)
Sodium: 140 mmol/L (ref 135–145)
TCO2: 21 mmol/L — ABNORMAL LOW (ref 22–32)

## 2021-11-29 LAB — HEPATIC FUNCTION PANEL
ALT: 19 U/L (ref 0–44)
AST: 28 U/L (ref 15–41)
Albumin: 2.6 g/dL — ABNORMAL LOW (ref 3.5–5.0)
Alkaline Phosphatase: 63 U/L (ref 38–126)
Bilirubin, Direct: 0.1 mg/dL (ref 0.0–0.2)
Indirect Bilirubin: 0.4 mg/dL (ref 0.3–0.9)
Total Bilirubin: 0.5 mg/dL (ref 0.3–1.2)
Total Protein: 6.4 g/dL — ABNORMAL LOW (ref 6.5–8.1)

## 2021-11-29 LAB — ECHOCARDIOGRAM COMPLETE
AR max vel: 1.53 cm2
AV Area VTI: 1.49 cm2
AV Area mean vel: 1.44 cm2
AV Mean grad: 7 mmHg
AV Peak grad: 12.3 mmHg
Ao pk vel: 1.75 m/s
Area-P 1/2: 3.5 cm2
Calc EF: 65 %
Height: 73 in
MV VTI: 0.27 cm2
S' Lateral: 3.7 cm
Single Plane A2C EF: 64.9 %
Single Plane A4C EF: 65.2 %
Weight: 3040 oz

## 2021-11-29 LAB — URINALYSIS, ROUTINE W REFLEX MICROSCOPIC
Bilirubin Urine: NEGATIVE
Glucose, UA: 50 mg/dL — AB
Ketones, ur: NEGATIVE mg/dL
Leukocytes,Ua: NEGATIVE
Nitrite: NEGATIVE
Protein, ur: 100 mg/dL — AB
Specific Gravity, Urine: 1.012 (ref 1.005–1.030)
pH: 5 (ref 5.0–8.0)

## 2021-11-29 LAB — BASIC METABOLIC PANEL
Anion gap: 13 (ref 5–15)
BUN: 52 mg/dL — ABNORMAL HIGH (ref 6–20)
CO2: 17 mmol/L — ABNORMAL LOW (ref 22–32)
Calcium: 8.2 mg/dL — ABNORMAL LOW (ref 8.9–10.3)
Chloride: 104 mmol/L (ref 98–111)
Creatinine, Ser: 6.03 mg/dL — ABNORMAL HIGH (ref 0.61–1.24)
GFR, Estimated: 10 mL/min — ABNORMAL LOW (ref >60)
Glucose, Bld: 165 mg/dL — ABNORMAL HIGH (ref 70–99)
Potassium: 4.9 mmol/L (ref 3.5–5.1)
Sodium: 134 mmol/L — ABNORMAL LOW (ref 135–145)

## 2021-11-29 LAB — IRON AND TIBC
Iron: 51 ug/dL (ref 45–182)
Saturation Ratios: 17 % — ABNORMAL LOW (ref 17.9–39.5)
TIBC: 293 ug/dL (ref 250–450)
UIBC: 242 ug/dL

## 2021-11-29 LAB — MAGNESIUM: Magnesium: 1.9 mg/dL (ref 1.7–2.4)

## 2021-11-29 LAB — HIV ANTIBODY (ROUTINE TESTING W REFLEX): HIV Screen 4th Generation wRfx: NONREACTIVE

## 2021-11-29 LAB — FERRITIN: Ferritin: 427 ng/mL — ABNORMAL HIGH (ref 24–336)

## 2021-11-29 MED ORDER — IPRATROPIUM-ALBUTEROL 0.5-2.5 (3) MG/3ML IN SOLN
3.0000 mL | Freq: Once | RESPIRATORY_TRACT | Status: AC
  Administered 2021-11-29: 03:00:00 3 mL via RESPIRATORY_TRACT
  Filled 2021-11-29: qty 3

## 2021-11-29 MED ORDER — DARBEPOETIN ALFA 100 MCG/0.5ML IJ SOSY
100.0000 ug | PREFILLED_SYRINGE | INTRAMUSCULAR | Status: DC
  Administered 2021-11-29: 22:00:00 100 ug via SUBCUTANEOUS
  Filled 2021-11-29: qty 0.5

## 2021-11-29 MED ORDER — SODIUM CHLORIDE 0.9% FLUSH
3.0000 mL | Freq: Two times a day (BID) | INTRAVENOUS | Status: DC
Start: 2021-11-29 — End: 2021-12-01
  Administered 2021-11-29 – 2021-12-01 (×2): 3 mL via INTRAVENOUS

## 2021-11-29 MED ORDER — MOMETASONE FURO-FORMOTEROL FUM 100-5 MCG/ACT IN AERO
2.0000 | INHALATION_SPRAY | Freq: Two times a day (BID) | RESPIRATORY_TRACT | Status: DC
  Administered 2021-11-30 – 2021-12-01 (×3): 2 via RESPIRATORY_TRACT
  Filled 2021-11-29 (×2): qty 8.8

## 2021-11-29 MED ORDER — FUROSEMIDE 10 MG/ML IJ SOLN
40.0000 mg | Freq: Once | INTRAMUSCULAR | Status: AC
  Administered 2021-11-29: 03:00:00 40 mg via INTRAVENOUS
  Filled 2021-11-29: qty 4

## 2021-11-29 MED ORDER — ACETAMINOPHEN 325 MG PO TABS: 650.0000 mg | ORAL_TABLET | ORAL | Status: DC | PRN

## 2021-11-29 MED ORDER — IPRATROPIUM-ALBUTEROL 0.5-2.5 (3) MG/3ML IN SOLN
3.0000 mL | Freq: Four times a day (QID) | RESPIRATORY_TRACT | Status: DC | PRN
  Administered 2021-11-29 – 2021-11-30 (×2): 3 mL via RESPIRATORY_TRACT
  Filled 2021-11-29 (×2): qty 3

## 2021-11-29 MED ORDER — ALBUTEROL SULFATE (2.5 MG/3ML) 0.083% IN NEBU
3.0000 mL | INHALATION_SOLUTION | Freq: Four times a day (QID) | RESPIRATORY_TRACT | Status: DC | PRN
  Administered 2021-11-29 – 2021-11-30 (×2): 3 mL via RESPIRATORY_TRACT
  Filled 2021-11-29 (×2): qty 3

## 2021-11-29 MED ORDER — LABETALOL HCL 200 MG PO TABS
200.0000 mg | ORAL_TABLET | Freq: Three times a day (TID) | ORAL | Status: DC
  Administered 2021-11-29 – 2021-12-01 (×7): 200 mg via ORAL
  Filled 2021-11-29 (×7): qty 1

## 2021-11-29 MED ORDER — LEVOTHYROXINE SODIUM 100 MCG PO TABS
100.0000 ug | ORAL_TABLET | Freq: Every day | ORAL | Status: DC
  Administered 2021-11-29 – 2021-12-01 (×3): 100 ug via ORAL
  Filled 2021-11-29 (×4): qty 1

## 2021-11-29 MED ORDER — AMLODIPINE BESYLATE 10 MG PO TABS
10.0000 mg | ORAL_TABLET | Freq: Every day | ORAL | Status: DC
  Administered 2021-11-29 – 2021-11-30 (×2): 10 mg via ORAL
  Filled 2021-11-29 (×2): qty 1

## 2021-11-29 MED ORDER — NITROGLYCERIN 2 % TD OINT
0.5000 [in_us] | TOPICAL_OINTMENT | Freq: Four times a day (QID) | TRANSDERMAL | Status: DC
  Administered 2021-11-29 – 2021-11-30 (×4): 0.5 [in_us] via TOPICAL
  Filled 2021-11-29: qty 1
  Filled 2021-11-29: qty 30
  Filled 2021-11-29: qty 1

## 2021-11-29 MED ORDER — SODIUM CHLORIDE 0.9 % IV SOLN: 250.0000 mL | INTRAVENOUS | Status: DC | PRN

## 2021-11-29 MED ORDER — ONDANSETRON HCL 4 MG/2ML IJ SOLN: 4.0000 mg | Freq: Four times a day (QID) | INTRAMUSCULAR | Status: DC | PRN

## 2021-11-29 MED ORDER — FUROSEMIDE 10 MG/ML IJ SOLN
40.0000 mg | Freq: Every day | INTRAMUSCULAR | Status: DC
  Filled 2021-11-29: qty 4

## 2021-11-29 MED ORDER — FUROSEMIDE 10 MG/ML IJ SOLN: 40.0000 mg | Freq: Once | INTRAMUSCULAR | Status: DC

## 2021-11-29 MED ORDER — SODIUM CHLORIDE 0.9% FLUSH: 3.0000 mL | INTRAVENOUS | Status: DC | PRN

## 2021-11-29 MED ORDER — HEPARIN SODIUM (PORCINE) 5000 UNIT/ML IJ SOLN
5000.0000 [IU] | Freq: Three times a day (TID) | INTRAMUSCULAR | Status: DC
  Administered 2021-11-29 – 2021-11-30 (×5): 5000 [IU] via SUBCUTANEOUS
  Filled 2021-11-29 (×5): qty 1

## 2021-11-29 MED ORDER — SODIUM CHLORIDE 0.9 % IV SOLN
2.0000 g | INTRAVENOUS | Status: DC
  Administered 2021-11-29: 06:00:00 2 g via INTRAVENOUS
  Filled 2021-11-29: qty 20

## 2021-11-29 MED ORDER — ATORVASTATIN CALCIUM 40 MG PO TABS
40.0000 mg | ORAL_TABLET | Freq: Every day | ORAL | Status: DC
  Administered 2021-11-29 – 2021-12-01 (×3): 40 mg via ORAL
  Filled 2021-11-29 (×3): qty 1

## 2021-11-29 NOTE — H&P (Addendum)
History and Physical    Patient: Johnny [name]:[id number] DOB: 11-28-1960 DOA: 11/28/2021 DOS: the patient was seen and examined on 11/29/2021 PCP: Charlott Rakes, MD  Patient coming from: Home  Chief Complaint: SOB  HPI: Johnny [name] is a 61 y.o. male with medical history significant of COPD, HTN, CKD stage 4-5, dCHF with grade 3 DD.  Pt presents to ED with c/o 2 day history of SOB.  Also has nasal congestion, fevers, chills, non-productive cough.  Not vaccinated to COVID nor flu.  No recent sick contacts.  Had been on lasix previously but stopped taking them because he just didn't want to take them anymore.  Review of Systems: As mentioned in the history of present illness. All other systems reviewed and are negative. Past Medical History:  Diagnosis Date   Adenomatous colon polyp    tubular   Chronic kidney disease    COPD (chronic obstructive pulmonary disease) (HCC)    Diverticulosis    External otitis of right ear 11/10/2018   Hepatitis C    Hypertension    OSA (obstructive sleep apnea) 11/03/2015   Strep throat 01/2021   Past Surgical History:  Procedure Laterality Date   AV FISTULA PLACEMENT  09/12/2009   Left arm AVF   Social History:  reports that he quit smoking about 10 years ago. His smoking use included cigarettes. He smoked an average of .1 packs per day. He has never used smokeless tobacco. He reports that he does not drink alcohol and does not use drugs.  Allergies  Allergen Reactions   Naproxen Other (See Comments)    Unknown    Family History  Problem Relation Age of Onset   Hypertension Mother    Diabetes Mother    Colon cancer Neg Hx     Prior to Admission medications   Medication Sig Start Date End Date Taking? Authorizing Provider  allopurinol (ZYLOPRIM) 100 MG tablet Take 2 tablets (200 mg total) by mouth daily. Patient taking differently: Take 100 mg by mouth daily. 11/08/20  Yes Freeman Caldron M, PA-C  amLODipine (NORVASC) 10 MG  tablet Take 1 tablet by mouth once daily Patient taking differently: Take 10 mg by mouth at bedtime. 11/13/21  Yes Charlott Rakes, MD  atorvastatin (LIPITOR) 40 MG tablet Take 1 tablet (40 mg total) by mouth daily. 08/31/21  Yes Charlott Rakes, MD  clotrimazole-betamethasone (LOTRISONE) cream Apply to affected area 2 times daily. 05/12/21  Yes Jaynee Eagles, PA-C  Colchicine (MITIGARE) 0.6 MG CAPS Take 2 capsules (1.2 mg) orally at the onset of a gout flare then repeat 1 capsule (0.6 mg) in 1 hour if pain continues. Patient taking differently: Take 1.2 mg by mouth as needed (gout flares). 01/10/21  Yes Charlott Rakes, MD  furosemide (LASIX) 20 MG tablet Take 1 tablet (20 mg total) by mouth daily as needed for edema. 11/08/20  Yes McClung, Angela M, PA-C  ipratropium-albuterol (DUONEB) 0.5-2.5 (3) MG/3ML SOLN USE 1 AMPULE IN NEBULIZER EVERY 6 HOURS AS NEEDED Patient taking differently: Take 3 mLs by nebulization every 6 (six) hours as needed. 11/13/21  Yes Newlin, Charlane Ferretti, MD  labetalol (NORMODYNE) 200 MG tablet TAKE 1 TABLET BY MOUTH THREE TIMES DAILY Patient taking differently: Take 200 mg by mouth 3 (three) times daily. 10/19/21  Yes Charlott Rakes, MD  levothyroxine (SYNTHROID) 100 MCG tablet Take 1 tablet by mouth once daily Patient taking differently: Take 100 mcg by mouth daily before breakfast. 11/13/21  Yes Charlott Rakes, MD  mometasone-formoterol (  DULERA) 100-5 MCG/ACT AERO Inhale 2 puffs into the lungs 2 (two) times daily. 01/16/21  Yes Newlin, Enobong, MD  PROAIR HFA 108 (90 Base) MCG/ACT inhaler INHALE 1 TO 2 PUFFS BY MOUTH EVERY 6 HOURS AS NEEDED FOR WHEEZING FOR SHORTNESS OF BREATH. MUST HAVE OFFICE VISIT FOR REFILLS. Patient taking differently: Inhale 1-2 puffs into the lungs every 6 (six) hours as needed for shortness of breath or wheezing. 09/12/21  Yes Newlin, Charlane Ferretti, MD  triamcinolone (KENALOG) 0.1 % Apply 1 application topically 2 (two) times daily. 11/09/20  Yes Charlott Rakes, MD  Blood  Pressure Monitoring (3 SERIES BP MONITOR/UPPER ARM) DEVI 1 Units by Does not apply route 2 (two) times daily. 10/13/19   Elsie Stain, MD  Misc. Devices MISC Blood Pressure monitor. Dx: HTN 11/25/19   Charlott Rakes, MD    Physical Exam: Vitals:   11/29/21 0300 11/29/21 0400 11/29/21 0430 11/29/21 0525  BP: (!) 167/96 (!) 166/96 (!) 149/97 (!) 163/94  Pulse: 84 88 75 79  Resp: (!) 27 (!) 29 (!) 28 (!) 32  Temp:      TempSrc:      SpO2: 100% 100% 98% 95%   Constitutional: NAD, calm, comfortable Eyes: PERRL, lids and conjunctivae normal ENMT: Mucous membranes are moist. Posterior pharynx clear of any exudate or lesions.Normal dentition.  Neck: normal, supple, no masses, no thyromegaly Respiratory: Wheezing with tachypnea, no accessory muscle use present though. Cardiovascular: Regular rate and rhythm, no murmurs / rubs / gallops. 1+ BLE edema. 2+ pedal pulses. No carotid bruits.  Abdomen: no tenderness, no masses palpated. No hepatosplenomegaly. Bowel sounds positive.  Musculoskeletal: no clubbing / cyanosis. No joint deformity upper and lower extremities. Good ROM, no contractures. Normal muscle tone.  Skin: no rashes, lesions, ulcers. No induration Neurologic: CN 2-12 grossly intact. Sensation intact, DTR normal. Strength 5/5 in all 4.  Psychiatric: Normal judgment and insight. Alert and oriented x 3. Normal mood.    Data Reviewed:  BNP 1296, CXR showing pulmonary vasc congestion + edema COVID and flu neg. WBC nl Creat 6 up from 4 baseline.  Assessment/Plan * Acute on chronic diastolic CHF (congestive heart failure) ([place])- (present on admission) Likely worsened in setting of stopping taking home lasix. CHF pathway Gentle diuresis with lasix 40mg  IV daily for the moment Close monitoring of renal function If fails medical diuresis will need to start dialysis Tele monitor 2d echo  AKI (acute kidney injury) ([place])- (present on admission) AKI vs progression of CKD to stage  5. EDP called nephrology who recd just giving lasix at this point, they dont feel he needs dialysis yet. Trying diuresis with lasix. 79cc UOP in ED thus far it seems Daily BMP with diuresis  Essential hypertension- (present on admission) Continue home BP meds  CKD (chronic kidney disease) stage 4, GFR 15-29 ml/min (HCC)- (present on admission) CKD stage 4-5 at baseline.  Not on dialysis yet. EDP called nephro, want [place] to try lasix to see if we can avoid dialysis.  COPD (chronic obstructive pulmonary disease) ([place])- (present on admission) Acute exacerbation likely secondary to CHF Continue home nebs Dulera PRN duoneb Got solumedrol in ED But would rather try to hold off on further systemic steroids at this time since I think much of his respiratory issues today stem from volume overload / decompensated CHF (as demonstrated on CXR). Empiric rocephin    Advance Care Planning:   Code Status: Full Code   Consults: EDP spoke with nephrology  Family Communication: None  in room  Severity of Illness: The appropriate patient status for this patient is OBSERVATION. Observation status is judged to be reasonable and necessary in order to provide the required intensity of service to ensure the patient's safety. The patient's presenting symptoms, physical exam findings, and initial radiographic and laboratory data in the context of their medical condition is felt to place them at decreased risk for further clinical deterioration. Furthermore, it is anticipated that the patient will be medically stable for discharge from the hospital within 2 midnights of admission.   Author: Etta Quill., DO 11/29/2021 5:59 AM  For on call review [email or web address].

## 2021-11-29 NOTE — Telephone Encounter (Signed)
Patient admitted to hospital for further assessment.

## 2021-11-29 NOTE — Assessment & Plan Note (Addendum)
Acute exacerbation likely secondary to CHF 1. Continue home nebs 1. Dulera 2. PRN duoneb 2. Got solumedrol in ED 3. But would rather try to hold off on further systemic steroids at this time since I think much of his respiratory issues today stem from volume overload / decompensated CHF (as demonstrated on CXR). 4. Empiric rocephin

## 2021-11-29 NOTE — Evaluation (Signed)
Physical Therapy Evaluation Patient Details Name: DUILIO HERITAGE MRN: [id number] DOB: 31-Dec-1960 Today's Date: 11/29/2021  History of Present Illness  Pt is a 61 y/o male presenting to ED on 1/24 with 2 day hx of SOB. Found with acute on chronic diastolic CHF, AKI PMH includes: COPD, HTN, CKD stage 4-5, CHF, hep C.  Clinical Impression  Patient evaluated by Physical Therapy with no further acute PT needs identified. All education has been completed and the patient has no further questions. Pt overall steady and able to perform DGI tasks without LOB. Pt at an independent to mod I level for mobility tasks. Mild SOB noted, however, oxygen sats WFL on RA. Educated about activity pacing and easing back into exercise.  See below for any follow-up Physical Therapy or equipment needs. PT is signing off. Thank you for this referral. If needs change, please re-consult.         Recommendations for follow up therapy are one component of a multi-disciplinary discharge planning process, led by the attending physician.  Recommendations may be updated based on patient status, additional functional criteria and insurance authorization.  Follow Up Recommendations No PT follow up    Assistance Recommended at Discharge PRN  Patient can return home with the following       Equipment Recommendations None recommended by PT  Recommendations for Other Services       Functional Status Assessment Patient has had a recent decline in their functional status and demonstrates the ability to make significant improvements in function in a reasonable and predictable amount of time.     Precautions / Restrictions Precautions Precautions: Fall Restrictions Weight Bearing Restrictions: No      Mobility  Bed Mobility Overal bed mobility: Modified Independent             General bed mobility comments: from elevated ED stretcher    Transfers Overall transfer level: Independent                       Ambulation/Gait Ambulation/Gait assistance: Independent Gait Distance (Feet): 200 Feet Assistive device: None Gait Pattern/deviations: WFL(Within Functional Limits) Gait velocity: WFL     General Gait Details: overall steady gait and able to perform DGI tasks without LOB.  Stairs            Wheelchair Mobility    Modified Rankin (Stroke Patients Only)       Balance Overall balance assessment: Independent                               Standardized Balance Assessment Standardized Balance Assessment : Dynamic Gait Index   Dynamic Gait Index Level Surface: Normal Change in Gait Speed: Normal Gait with Horizontal Head Turns: Normal Gait with Vertical Head Turns: Normal Gait and Pivot Turn: Normal Step Over Obstacle: Normal Step Around Obstacles: Normal       Pertinent Vitals/Pain Pain Assessment Pain Assessment: No/denies pain    Home Living Family/patient expects to be discharged to:: Private residence Living Arrangements: Alone   Type of Home: House Home Access: Ramped entrance       Home Layout: One level Home Equipment: None      Prior Function Prior Level of Function : Independent/Modified Independent;Other (comment) (going to school at [employer])               ADLs Comments: not driving, uses the bus for transportation     Hand  Dominance        Extremity/Trunk Assessment   Upper Extremity Assessment Upper Extremity Assessment: Defer to OT evaluation    Lower Extremity Assessment Lower Extremity Assessment: Overall WFL for tasks assessed    Cervical / Trunk Assessment Cervical / Trunk Assessment: Normal  Communication   Communication: No difficulties  Cognition Arousal/Alertness: Awake/alert Behavior During Therapy: WFL for tasks assessed/performed Overall Cognitive Status: Within Functional Limits for tasks assessed                                 General Comments: tangential at times but likely  close to baseline        General Comments General comments (skin integrity, edema, etc.): mild SOB noted, but oxygen sats WFL on RA    Exercises     Assessment/Plan    PT Assessment Patient does not need any further PT services  PT Problem List         PT Treatment Interventions      PT Goals (Current goals can be found in the Care Plan section)  Acute Rehab PT Goals Patient Stated Goal: to go home PT Goal Formulation: With patient Time For Goal Achievement: 11/29/21 Potential to Achieve Goals: Good    Frequency       Co-evaluation               AM-PAC PT "6 Clicks" Mobility  Outcome Measure Help needed turning from your back to your side while in a flat bed without using bedrails?: None Help needed moving from lying on your back to sitting on the side of a flat bed without using bedrails?: None Help needed moving to and from a bed to a chair (including a wheelchair)?: None Help needed standing up from a chair using your arms (e.g., wheelchair or bedside chair)?: None Help needed to walk in hospital room?: None Help needed climbing 3-5 steps with a railing? : None 6 Click Score: 24    End of Session Equipment Utilized During Treatment: Gait belt Activity Tolerance: Patient tolerated treatment well Patient left: in bed;with call bell/phone within reach (on stretcher in ED) Nurse Communication: Mobility status PT Visit Diagnosis: Other abnormalities of gait and mobility (R26.89)    Time: 3612-2449 PT Time Calculation (min) (ACUTE ONLY): 10 min   Charges:   PT Evaluation $PT Eval Low Complexity: 1 Low          Lou Miner, DPT  Acute Rehabilitation Services  Pager: [phone] Office: [phone]   Rudean Hitt 11/29/2021, 12:22 PM

## 2021-11-29 NOTE — Telephone Encounter (Signed)
Call received from patient. He is currently in ED at [facility]. He explained that the mold in his home may be a cause of his breathing issues.   He is working with the [employer] to secure permanent housing and is currently on a wait list.  He is wondering if being in the hospital with breathing issues would expedite obtaining housing. Explained to him that is not likely but he can discuss with the hospital SW and/or CM and he said he understood.

## 2021-11-29 NOTE — ED Provider Notes (Signed)
[facility] EMERGENCY DEPARTMENT Provider Note   CSN: [id number] Arrival date & time: 11/28/21  2028     History  Chief Complaint  Patient presents with   SOB/COPD     Johnny [name] is a 61 y.o. male.  HPI  Patient with medical history including hep C COPD hypertension stage IV chronic kidney disease presents with complaint of shortness of breath.  Patient states been going on for about 2 days time, endorses nasal congestion fevers, chills, nonproductive cough.  He denies  stomach pains nausea vomiting diarrhea general body aches, he is not vaccinated to COVID-19 or influenza denies  recent sick contacts.  He states that he feels some slight tightness in his chest but denies  chest pain, pleuritic chest pain, dyspnea on exertion, he notes that he has been having increased pedal edema without worsening orthopnea.  He notes that he was on Lasix but had stopped taking them because he just did not want to take them anymore.  After reviewing patient's charts he has known history of CKD, baseline appears to be around 4, he is socially taking 20 of Lasix daily, recent echo showed an EF of 55 to 60%  Home Medications Prior to Admission medications   Medication Sig Start Date End Date Taking? Authorizing Provider  allopurinol (ZYLOPRIM) 100 MG tablet Take 2 tablets (200 mg total) by mouth daily. Patient taking differently: Take 100 mg by mouth daily. 11/08/20  Yes Freeman Caldron M, PA-C  amLODipine (NORVASC) 10 MG tablet Take 1 tablet by mouth once daily Patient taking differently: Take 10 mg by mouth at bedtime. 11/13/21  Yes Charlott Rakes, MD  atorvastatin (LIPITOR) 40 MG tablet Take 1 tablet (40 mg total) by mouth daily. 08/31/21  Yes Charlott Rakes, MD  clotrimazole-betamethasone (LOTRISONE) cream Apply to affected area 2 times daily. 05/12/21  Yes Jaynee Eagles, PA-C  Colchicine (MITIGARE) 0.6 MG CAPS Take 2 capsules (1.2 mg) orally at the onset of a gout flare then repeat 1  capsule (0.6 mg) in 1 hour if pain continues. Patient taking differently: Take 1.2 mg by mouth as needed (gout flares). 01/10/21  Yes Charlott Rakes, MD  furosemide (LASIX) 20 MG tablet Take 1 tablet (20 mg total) by mouth daily as needed for edema. 11/08/20  Yes McClung, Angela M, PA-C  ipratropium-albuterol (DUONEB) 0.5-2.5 (3) MG/3ML SOLN USE 1 AMPULE IN NEBULIZER EVERY 6 HOURS AS NEEDED Patient taking differently: Take 3 mLs by nebulization every 6 (six) hours as needed. 11/13/21  Yes Newlin, Charlane Ferretti, MD  labetalol (NORMODYNE) 200 MG tablet TAKE 1 TABLET BY MOUTH THREE TIMES DAILY Patient taking differently: Take 200 mg by mouth 3 (three) times daily. 10/19/21  Yes Charlott Rakes, MD  levothyroxine (SYNTHROID) 100 MCG tablet Take 1 tablet by mouth once daily Patient taking differently: Take 100 mcg by mouth daily before breakfast. 11/13/21  Yes Newlin, Enobong, MD  mometasone-formoterol (DULERA) 100-5 MCG/ACT AERO Inhale 2 puffs into the lungs 2 (two) times daily. 01/16/21  Yes Newlin, Enobong, MD  PROAIR HFA 108 (90 Base) MCG/ACT inhaler INHALE 1 TO 2 PUFFS BY MOUTH EVERY 6 HOURS AS NEEDED FOR WHEEZING FOR SHORTNESS OF BREATH. MUST HAVE OFFICE VISIT FOR REFILLS. Patient taking differently: Inhale 1-2 puffs into the lungs every 6 (six) hours as needed for shortness of breath or wheezing. 09/12/21  Yes Newlin, Charlane Ferretti, MD  triamcinolone (KENALOG) 0.1 % Apply 1 application topically 2 (two) times daily. 11/09/20  Yes Charlott Rakes, MD  Blood Pressure  Monitoring (3 SERIES BP MONITOR/UPPER ARM) DEVI 1 Units by Does not apply route 2 (two) times daily. 10/13/19   Elsie Stain, MD  Misc. Devices MISC Blood Pressure monitor. Dx: HTN 11/25/19   Charlott Rakes, MD      Allergies    Naproxen    Review of Systems   Review of Systems  Constitutional:  Positive for chills. Negative for fever.  HENT:  Positive for congestion.   Respiratory:  Positive for cough and chest tightness. Negative for shortness of  breath.   Cardiovascular:  Positive for leg swelling. Negative for chest pain and palpitations.  Gastrointestinal:  Negative for abdominal pain, diarrhea, nausea and vomiting.  Genitourinary:  Positive for decreased urine volume. Negative for enuresis.  Neurological:  Negative for headaches.   Physical Exam Updated Vital Signs BP (!) 167/96    Pulse 84    Temp 98.2 F (36.8 C) (Oral)    Resp (!) 27    SpO2 100%  Physical Exam Vitals and nursing note reviewed.  Constitutional:      General: He is not in acute distress.    Appearance: He is not ill-appearing.  HENT:     Head: Normocephalic and atraumatic.     Nose: No congestion.     Mouth/Throat:     Mouth: Mucous membranes are moist.     Pharynx: Oropharynx is clear.  Eyes:     Conjunctiva/sclera: Conjunctivae normal.  Cardiovascular:     Rate and Rhythm: Normal rate and regular rhythm.     Pulses: Normal pulses.     Heart sounds: No murmur heard.   No friction rub. No gallop.  Pulmonary:     Effort: No respiratory distress.     Breath sounds: Wheezing present. No rhonchi or rales.     Comments: Patient was showing no signs respiratory distress speaking full sentences, nonhypoxic, slightly tachypneic upper 90s on room air, has slight expiratory wheezing heard bilaterally no rales rhonchi or stridor present Abdominal:     Palpations: Abdomen is soft.     Tenderness: There is no abdominal tenderness. There is no right CVA tenderness or left CVA tenderness.  Musculoskeletal:     Right lower leg: Edema present.     Left lower leg: Edema present.     Comments: Noted 1+ pitting edema up to the mid shins no overlying skin changes neurovascular fully intact  Skin:    General: Skin is warm and dry.  Neurological:     Mental Status: He is alert.  Psychiatric:        Mood and Affect: Mood normal.    ED Results / Procedures / Treatments   Labs (all labs ordered are listed, but only abnormal results are displayed) Labs Reviewed   CBC WITH DIFFERENTIAL/PLATELET - Abnormal; Notable for the following components:      Result Value   RBC 3.77 (*)    Hemoglobin 9.4 (*)    HCT 28.0 (*)    MCV 74.3 (*)    MCH 24.9 (*)    RDW 17.9 (*)    All other components within normal limits  BASIC METABOLIC PANEL - Abnormal; Notable for the following components:   CO2 21 (*)    BUN 42 (*)    Creatinine, Ser 6.04 (*)    Calcium 7.8 (*)    GFR, Estimated 10 (*)    All other components within normal limits  BRAIN NATRIURETIC PEPTIDE - Abnormal; Notable for the following components:   B  Natriuretic Peptide 1,296.1 (*)    All other components within normal limits  HEPATIC FUNCTION PANEL - Abnormal; Notable for the following components:   Total Protein 6.4 (*)    Albumin 2.6 (*)    All other components within normal limits  RESP PANEL BY RT-PCR (FLU A&B, COVID) ARPGX2  MAGNESIUM  URINALYSIS, ROUTINE W REFLEX MICROSCOPIC    EKG  Radiology DG Chest 1 View  Result Date: 11/28/2021 CLINICAL DATA:  Shortness of breath. EXAM: CHEST  1 VIEW COMPARISON:  Chest radiograph dated 09/03/2016 FINDINGS: There is cardiomegaly with vascular congestion and mild edema. No focal consolidation, pleural effusion or pneumothorax. No acute osseous pathology. IMPRESSION: Cardiomegaly with vascular congestion and mild edema. Electronically Signed   By: Anner Crete M.D.   On: 11/28/2021 21:21    Procedures Procedures    Medications Ordered in ED Medications  ipratropium-albuterol (DUONEB) 0.5-2.5 (3) MG/3ML nebulizer solution 3 mL (3 mLs Nebulization Given 11/29/21 0249)  furosemide (LASIX) injection 40 mg (40 mg Intravenous Given 11/29/21 6256)    ED Course/ Medical Decision Making/ A&P                           Medical Decision Making Amount and/or Complexity of Data Reviewed Labs: ordered.  Risk Prescription drug management. Decision regarding hospitalization.  This patient presents to the ED for concern of surgical breath cough,  this involves an extensive number of treatment options, and is a complaint that carries with it a high risk of complications and morbidity.  The differential diagnosis includes COPD exacerbation, pneumonia, CHF    Additional history obtained:  Additional history obtained from electronic medical record External records from outside source obtained and reviewed including previous PCP notes please see HPI   Co morbidities that complicate the patient evaluation  CKD, hypertension,  Social Determinants of Health:  N/A    Lab Tests:  I Ordered, and personally interpreted labs.  The pertinent results include: CBC shows normocytic anemia he will 9.4 at baseline, BMP shows CO2 of 21 BUN 42 creatinine    Imaging Studies ordered:  I ordered imaging studies including chest x-ray I independently visualized and interpreted imaging which showed cardiomegaly with vascular congestion I agree with the radiologist interpretation   Cardiac Monitoring:  The patient was maintained on a cardiac monitor.  I personally viewed and interpreted the cardiac monitored which showed an underlying rhythm of: EKG without signs of ischemia   Medicines ordered and prescription drug management:  I ordered medication including Lasix, albuterol for volume overload, wheezing I have reviewed the patients home medicines and have made adjustments as needed    Reevaluation:  Noted patient appears to be volume overloaded on my exam peripheral edema as well as congestion seen on x-ray has worsening kidney functions will consult with nephrology for further recommendations  Patient was provided Lasix, in and outs were monitored, had good urine output, he was ambulated and become very short of breath on exertion, he remains tachypneic, suspect his shortness of breath is likely from volume overload and will recommend admission.  Patient agreed in this plan will consult hospitalist for admission  Patient to be  hypertensive with prime with half inch of Nitropaste    Consultations Obtained:  I requested consultation with the nephrology Dr. Posey Pronto,  and discussed lab and imaging findings as well as pertinent plan - they recommend: Who does not feel patient will need emergent hemodialysis, feels that patient can be  diuresed, recommends increasing home dose of Lasix from 20mg  to 40mg . Dr. Jennette Kettle hospitalist he will admit   Rule out I have low suspicion for ACS as history is atypical, patient has no cardiac history, EKG was sinus rhythm without signs of ischemia, troponins were deferred patient denies any chest pain low suspicion for this.  Low suspicion for PE as patient denies pleuritic chest pain, no unilateral leg swelling, nontachycardic, presentation atypical etiology.  He is noted to be tachypneic but this is more consistent with volume overload and has noted vascular congestion as well as peripheral edema.  Low suspicion for AAA or aortic dissection as history is atypical, patient has low risk factors.  I have low suspicion for emergent hemodialysis as no severe electrolyte derailments, he is volume overloaded and slight tachypneic but he still making urine, likely fluid can be diuresed.  Low suspicion for systemic infection as patient is nontoxic-appearing, vital signs reassuring, no obvious source infection noted on exam.     Dispostion and problem list  After consideration of the diagnostic results and the patients response to treatment, I feel that the patent would benefit from admission  1 acute CHF exacerbation-likely secondary due to worsening kidney function, patient will need diuresis and continue to monitor.             Final Clinical Impression(s) / ED Diagnoses Final diagnoses:  None    Rx / DC Orders ED Discharge Orders     None         Marcello Fennel, PA-C 11/29/21 0615    Ripley Fraise, MD 11/29/21 [phone]

## 2021-11-29 NOTE — Progress Notes (Signed)
° °  Echocardiogram 2D Echocardiogram has been performed.  Beryle Beams 11/29/2021, 9:47 AM

## 2021-11-29 NOTE — Evaluation (Signed)
Occupational Therapy Evaluation Patient Details Name: Johnny [name] MRN: [id number] DOB: 1961/07/03 Today's Date: 11/29/2021   History of Present Illness Pt is a 61 y/o male presenting to ED on 1/24 with 2 day hx of SOB. Found with acute on chronic diastolic CHF, AKI PMH includes: COPD, HTN, CKD stage 4-5, CHF, hep C.   Clinical Impression   PTA patient independent. Admitted for above and presenting near baseline independent level for ADLs, mobility and transfers.  Pt educated on energy conservation techniques to optimize tolerance and safety, verbalizes understanding.  VSS on RA during session.  No further OT needs identified and OT will sign off.       Recommendations for follow up therapy are one component of a multi-disciplinary discharge planning process, led by the attending physician.  Recommendations may be updated based on patient status, additional functional criteria and insurance authorization.   Follow Up Recommendations  No OT follow up    Assistance Recommended at Discharge None  Patient can return home with the following      Functional Status Assessment     Equipment Recommendations  None recommended by OT    Recommendations for Other Services       Precautions / Restrictions Precautions Precautions: Fall Restrictions Weight Bearing Restrictions: No      Mobility Bed Mobility Overal bed mobility: Modified Independent             General bed mobility comments: from elevated ED stretcher    Transfers Overall transfer level: Independent                        Balance Overall balance assessment: No apparent balance deficits (not formally assessed)                                         ADL either performed or assessed with clinical judgement   ADL Overall ADL's : Modified independent                                       General ADL Comments: reviewed energy conservation techniques, completing ADLs  with modified independence.     Vision Baseline Vision/History: 1 Wears glasses Vision Assessment?: No apparent visual deficits     Perception     Praxis      Pertinent Vitals/Pain Pain Assessment Pain Assessment: No/denies pain     Hand Dominance     Extremity/Trunk Assessment Upper Extremity Assessment Upper Extremity Assessment: Overall WFL for tasks assessed   Lower Extremity Assessment Lower Extremity Assessment: Defer to PT evaluation       Communication Communication Communication: No difficulties   Cognition Arousal/Alertness: Awake/alert Behavior During Therapy: WFL for tasks assessed/performed Overall Cognitive Status: Within Functional Limits for tasks assessed                                       General Comments  VSS on RA, mild SOB with activity    Exercises     Shoulder Instructions      Home Living Family/patient expects to be discharged to:: Private residence Living Arrangements: Alone   Type of Home: House Home Access: Ramped entrance     Home Layout:  One level     Bathroom Shower/Tub: Teacher, early years/pre: Handicapped height     Home Equipment: None          Prior Functioning/Environment Prior Level of Function : Independent/Modified Independent;Other (comment) (going to school at [employer])               ADLs Comments: not driving, uses the bus for transportation        OT Problem List: Decreased activity tolerance;Cardiopulmonary status limiting activity      OT Treatment/Interventions:      OT Goals(Current goals can be found in the care plan section) Acute Rehab OT Goals Patient Stated Goal: home OT Goal Formulation: With patient  OT Frequency:      Co-evaluation              AM-PAC OT "6 Clicks" Daily Activity     Outcome Measure Help from another person eating meals?: None Help from another person taking care of personal grooming?: None Help from another person  toileting, which includes using toliet, bedpan, or urinal?: None Help from another person bathing (including washing, rinsing, drying)?: None Help from another person to put on and taking off regular upper body clothing?: None Help from another person to put on and taking off regular lower body clothing?: None 6 Click Score: 24   End of Session Nurse Communication: Mobility status  Activity Tolerance: Patient tolerated treatment well Patient left: in bed;with call bell/phone within reach  OT Visit Diagnosis: Unsteadiness on feet (R26.81);Other (comment) (decreased activity tolerance)                Time: 7356-7014 OT Time Calculation (min): 10 min Charges:  OT General Charges $OT Visit: 1 Visit OT Evaluation $OT Eval Low Complexity: 1 Low  Johnny [name], OT Acute Rehabilitation Services Pager [phone] Office [phone]   Johnny [name] 11/29/2021, 11:01 AM

## 2021-11-29 NOTE — Assessment & Plan Note (Signed)
AKI vs progression of CKD to stage 5. EDP called nephrology who recd just giving lasix at this point, they dont feel he needs dialysis yet. Trying diuresis with lasix. 500cc UOP in ED thus far it seems Daily BMP with diuresis

## 2021-11-29 NOTE — Progress Notes (Signed)
Heart Failure Navigator Progress Note  Assessed for Heart & Vascular TOC clinic readiness.  Patient does not meet criteria due to per chart review: CKD IV-V, has AVF from 2010. Per nephrology: no HD yet, attempting diuresis with IV lasix. SCr +6. Pt stopped taking home lasix.   EF 2022: 55-60%, G3DD.   Navigator available for educational resources.  Pricilla Holm, MSN, RN Heart Failure Nurse Navigator [phone]

## 2021-11-29 NOTE — Assessment & Plan Note (Addendum)
Likely worsened in setting of stopping taking home lasix. 1. CHF pathway 2. Gentle diuresis with lasix 40mg  IV daily for the moment 3. Close monitoring of renal function 4. If fails medical diuresis will need to start dialysis 5. Tele monitor 6. 2d echo

## 2021-11-29 NOTE — Consult Note (Signed)
[facility] Renal Consultation Note  Requesting MD: Alcario Drought Indication for Consultation: advanced CKD  HPI:  Johnny [name] is a 61 y.o. male with HTN, COPD, OSA, gout  and CKD-  pretty advanced- not sure of etiology-  previously followed by Dr. Justin Mend at [facility] but admits he hasnt been there in a while -  I cannot find anything in our computerized system. Sounds like he hasnt been seeing anyone really-  no PCP-  was trying to exercise and eat right thinking that will heal him along with God-  unclear how he was getting medications-  told the ER that he stopped taking at least lasix because of frequent urination and nocturia.  He presented to the ER with SOB-  CXR showed vascular congestion and mild edema-  he has been given lasix 40 IV last night and this AM as well as breathing treatments-  he says he feels a lot better.  Labs show crt of 6-  K of 4.9, bicarb 17, albumin of 2.6 and hgb of 9.9. He has an AVF in place-  was put in in 2010-  is still patent.   I tried to feel him out for uremic sxms-  he denies appetite disturbance -  is fatigued but says is due to nocturia.  Urine is actually pretty bland-  small hgb and 100 of protein  Creat  Date/Time Value Ref Range Status  12/07/2016 10:06 AM 2.78 (H) 0.70 - 1.33 mg/dL Final    Comment:      For patients > or = 59 years of age: The upper reference limit for Creatinine is approximately 13% higher for people identified as African-American.      Creatinine, Ser  Date/Time Value Ref Range Status  11/29/2021 11:04 AM 6.03 (H) 0.61 - 1.24 mg/dL Final  11/29/2021 05:36 AM 6.80 (H) 0.61 - 1.24 mg/dL Final  11/28/2021 09:04 PM 6.04 (H) 0.61 - 1.24 mg/dL Final  01/30/2021 05:20 AM 4.74 (H) 0.61 - 1.24 mg/dL Final  11/08/2020 04:54 PM 5.39 (H) 0.76 - 1.27 mg/dL Final  06/10/2019 12:00 PM 3.41 (H) 0.76 - 1.27 mg/dL Final  04/10/2019 04:46 PM 4.26 (H) 0.76 - 1.27 mg/dL Final  11/10/2018 11:04 AM 3.37 (H) 0.76 - 1.27 mg/dL Final   02/12/2018 10:57 AM 2.91 (H) 0.76 - 1.27 mg/dL Final  08/30/2017 03:21 PM 3.04 (H) 0.76 - 1.27 mg/dL Final  09/03/2016 10:54 AM 2.32 (H) 0.61 - 1.24 mg/dL Final  06/07/2016 07:18 AM 2.12 (H) 0.61 - 1.24 mg/dL Final  06/06/2016 08:02 PM 2.22 (H) 0.61 - 1.24 mg/dL Final  07/02/2015 03:07 PM 3.21 (H) 0.61 - 1.24 mg/dL Final  07/22/2011 11:32 PM 2.62 (H) 0.50 - 1.35 mg/dL Final  07/10/2009 09:56 PM 3.4 (H) 0.4 - 1.5 mg/dL Final  02/26/2009 03:15 AM 4.46 (H) 0.4 - 1.5 mg/dL Final  02/25/2009 03:45 AM 4.14 (H) 0.4 - 1.5 mg/dL Final  02/23/2009 03:37 AM 3.89 (H) 0.4 - 1.5 mg/dL Final  02/22/2009 06:35 AM 4.19 (H) 0.4 - 1.5 mg/dL Final  02/21/2009 11:00 PM 4.14 (H) 0.4 - 1.5 mg/dL Final  02/21/2009 05:30 PM 4.23 (H) 0.4 - 1.5 mg/dL Final  02/21/2009 01:37 PM 4.7 (H) 0.4 - 1.5 mg/dL Final     PMHx:   Past Medical History:  Diagnosis Date   Adenomatous colon polyp    tubular   Chronic kidney disease    COPD (chronic obstructive pulmonary disease) (HCC)    Diverticulosis    External otitis of  right ear 11/10/2018   Hepatitis C    Hypertension    OSA (obstructive sleep apnea) 11/03/2015   Strep throat 01/2021    Past Surgical History:  Procedure Laterality Date   AV FISTULA PLACEMENT  09/12/2009   Left arm AVF    Family Hx:  Family History  Problem Relation Age of Onset   Hypertension Mother    Diabetes Mother    Colon cancer Neg Hx     Social History:  reports that he quit smoking about 10 years ago. His smoking use included cigarettes. He smoked an average of .1 packs per day. He has never used smokeless tobacco. He reports that he does not drink alcohol and does not use drugs.  Allergies:  Allergies  Allergen Reactions   Naproxen Other (See Comments)    Unknown    Medications: Prior to Admission medications   Medication Sig Start Date End Date Taking? Authorizing Provider  allopurinol (ZYLOPRIM) 100 MG tablet Take 2 tablets (200 mg total) by mouth daily. Patient  taking differently: Take 100 mg by mouth daily. 11/08/20  Yes Freeman Caldron M, PA-C  amLODipine (NORVASC) 10 MG tablet Take 1 tablet by mouth once daily Patient taking differently: Take 10 mg by mouth at bedtime. 11/13/21  Yes Charlott Rakes, MD  atorvastatin (LIPITOR) 40 MG tablet Take 1 tablet (40 mg total) by mouth daily. 08/31/21  Yes Charlott Rakes, MD  clotrimazole-betamethasone (LOTRISONE) cream Apply to affected area 2 times daily. 05/12/21  Yes Jaynee Eagles, PA-C  Colchicine (MITIGARE) 0.6 MG CAPS Take 2 capsules (1.2 mg) orally at the onset of a gout flare then repeat 1 capsule (0.6 mg) in 1 hour if pain continues. Patient taking differently: Take 1.2 mg by mouth as needed (gout flares). 01/10/21  Yes Charlott Rakes, MD  furosemide (LASIX) 20 MG tablet Take 1 tablet (20 mg total) by mouth daily as needed for edema. 11/08/20  Yes McClung, Angela M, PA-C  ipratropium-albuterol (DUONEB) 0.5-2.5 (3) MG/3ML SOLN USE 1 AMPULE IN NEBULIZER EVERY 6 HOURS AS NEEDED Patient taking differently: Take 3 mLs by nebulization every 6 (six) hours as needed. 11/13/21  Yes Newlin, Charlane Ferretti, MD  labetalol (NORMODYNE) 200 MG tablet TAKE 1 TABLET BY MOUTH THREE TIMES DAILY Patient taking differently: Take 200 mg by mouth 3 (three) times daily. 10/19/21  Yes Charlott Rakes, MD  levothyroxine (SYNTHROID) 100 MCG tablet Take 1 tablet by mouth once daily Patient taking differently: Take 100 mcg by mouth daily before breakfast. 11/13/21  Yes Newlin, Enobong, MD  mometasone-formoterol (DULERA) 100-5 MCG/ACT AERO Inhale 2 puffs into the lungs 2 (two) times daily. 01/16/21  Yes Newlin, Enobong, MD  PROAIR HFA 108 (90 Base) MCG/ACT inhaler INHALE 1 TO 2 PUFFS BY MOUTH EVERY 6 HOURS AS NEEDED FOR WHEEZING FOR SHORTNESS OF BREATH. MUST HAVE OFFICE VISIT FOR REFILLS. Patient taking differently: Inhale 1-2 puffs into the lungs every 6 (six) hours as needed for shortness of breath or wheezing. 09/12/21  Yes Newlin, Charlane Ferretti, MD   triamcinolone (KENALOG) 0.1 % Apply 1 application topically 2 (two) times daily. 11/09/20  Yes Charlott Rakes, MD  Blood Pressure Monitoring (3 SERIES BP MONITOR/UPPER ARM) DEVI 1 Units by Does not apply route 2 (two) times daily. 10/13/19   Elsie Stain, MD  Misc. Devices MISC Blood Pressure monitor. Dx: HTN 11/25/19   Charlott Rakes, MD    I have reviewed the patient's current medications.  Labs:  Results for orders placed or performed during the hospital  encounter of 11/28/21 (from the past 48 hour(s))  CBC with Differential     Status: Abnormal   Collection Time: 11/28/21  9:04 PM  Result Value Ref Range   WBC 8.4 4.0 - 10.5 K/uL   RBC 3.77 (L) 4.22 - 5.81 MIL/uL   Hemoglobin 9.4 (L) 13.0 - 17.0 g/dL   HCT 28.0 (L) 39.0 - 52.0 %   MCV 74.3 (L) 80.0 - 100.0 fL   MCH 24.9 (L) 26.0 - 34.0 pg   MCHC 33.6 30.0 - 36.0 g/dL   RDW 17.9 (H) 11.5 - 15.5 %   Platelets 214 150 - 400 K/uL   nRBC 0.0 0.0 - 0.2 %   Neutrophils Relative % 62 %   Neutro Abs 5.2 1.7 - 7.7 K/uL   Lymphocytes Relative 26 %   Lymphs Abs 2.2 0.7 - 4.0 K/uL   Monocytes Relative 7 %   Monocytes Absolute 0.6 0.1 - 1.0 K/uL   Eosinophils Relative 4 %   Eosinophils Absolute 0.4 0.0 - 0.5 K/uL   Basophils Relative 1 %   Basophils Absolute 0.1 0.0 - 0.1 K/uL   Immature Granulocytes 0 %   Abs Immature Granulocytes 0.02 0.00 - 0.07 K/uL    Comment: Performed at [facility], [address], [place], [place] [address]  Basic metabolic panel     Status: Abnormal   Collection Time: 11/28/21  9:04 PM  Result Value Ref Range   Sodium 140 135 - 145 mmol/L   Potassium 5.1 3.5 - 5.1 mmol/L   Chloride 110 98 - 111 mmol/L   CO2 21 (L) 22 - 32 mmol/L   Glucose, Bld 94 70 - 99 mg/dL    Comment: Glucose reference range applies only to samples taken after fasting for at least 8 hours.   BUN 42 (H) 6 - 20 mg/dL   Creatinine, Ser 6.04 (H) 0.61 - 1.24 mg/dL   Calcium 7.8 (L) 8.9 - 10.3 mg/dL   GFR, Estimated 10 (L) >60  mL/min    Comment: (NOTE) Calculated using the CKD-EPI Creatinine Equation (2021)    Anion gap 9 5 - 15    Comment: Performed at [facility] [address], [place], [place] [address]  Brain natriuretic peptide     Status: Abnormal   Collection Time: 11/28/21  9:05 PM  Result Value Ref Range   B Natriuretic Peptide 1,296.1 (H) 0.0 - 100.0 pg/mL    Comment: Performed at [facility] [address] [place], [place] [address]  Resp Panel by RT-PCR (Flu A&B, Covid) Nasopharyngeal Swab     Status: None   Collection Time: 11/28/21  9:35 PM   Specimen: Nasopharyngeal Swab; Nasopharyngeal(NP) swabs in vial transport medium  Result Value Ref Range   SARS Coronavirus 2 by RT PCR NEGATIVE NEGATIVE    Comment: (NOTE) SARS-CoV-2 target nucleic acids are NOT DETECTED.  The SARS-CoV-2 RNA is generally detectable in upper respiratory specimens during the acute phase of infection. The lowest concentration of SARS-CoV-2 viral copies this assay can detect is 138 copies/mL. A negative result does not preclude SARS-Cov-2 infection and should not be used as the sole basis for treatment or other patient management decisions. A negative result may occur with  improper specimen collection/handling, submission of specimen other than nasopharyngeal swab, presence of viral mutation(s) within the areas targeted by this assay, and inadequate number of viral copies(<138 copies/mL). A negative result must be combined with clinical observations, patient history, and  epidemiological information. The expected result is Negative.  Fact Sheet for Patients:  EntrepreneurPulse.com.au  Fact Sheet for Healthcare Providers:  IncredibleEmployment.be  This test is no t yet approved or cleared by the [place] FDA and  has been authorized for detection and/or diagnosis of SARS-CoV-2 by FDA under an Emergency Use Authorization (EUA). This EUA will remain  in effect  (meaning this test can be used) for the duration of the COVID-19 declaration under Section 564(b)(1) of the Act, 21 U.S.C.section 360bbb-3(b)(1), unless the authorization is terminated  or revoked sooner.       Influenza A by PCR NEGATIVE NEGATIVE   Influenza B by PCR NEGATIVE NEGATIVE    Comment: (NOTE) The Xpert Xpress SARS-CoV-2/FLU/RSV plus assay is intended as an aid in the diagnosis of influenza from Nasopharyngeal swab specimens and should not be used as a sole basis for treatment. Nasal washings and aspirates are unacceptable for Xpert Xpress SARS-CoV-2/FLU/RSV testing.  Fact Sheet for Patients: EntrepreneurPulse.com.au  Fact Sheet for Healthcare Providers: IncredibleEmployment.be  This test is not yet approved or cleared by the [place] FDA and has been authorized for detection and/or diagnosis of SARS-CoV-2 by FDA under an Emergency Use Authorization (EUA). This EUA will remain in effect (meaning this test can be used) for the duration of the COVID-19 declaration under Section 564(b)(1) of the Act, 21 U.S.C. section 360bbb-3(b)(1), unless the authorization is terminated or revoked.  Performed at [facility], [place] [address] [place], [place] [address]   Hepatic function panel     Status: Abnormal   Collection Time: 11/29/21  2:52 AM  Result Value Ref Range   Total Protein 6.4 (L) 6.5 - 8.1 g/dL   Albumin 2.6 (L) 3.5 - 5.0 g/dL   AST 28 15 - 41 U/L   ALT 19 0 - 44 U/L   Alkaline Phosphatase 63 38 - 126 U/L   Total Bilirubin 0.5 0.3 - 1.2 mg/dL   Bilirubin, Direct 0.1 0.0 - 0.2 mg/dL   Indirect Bilirubin 0.4 0.3 - 0.9 mg/dL    Comment: Performed at [facility] [address], [place], [place] [address]  Magnesium     Status: None   Collection Time: 11/29/21  2:52 AM  Result Value Ref Range   Magnesium 1.9 1.7 - 2.4 mg/dL    Comment: Performed at [facility], [place] [address] [place], [place] [address]   Urinalysis, Routine w reflex microscopic     Status: Abnormal   Collection Time: 11/29/21  5:01 AM  Result Value Ref Range   Color, Urine YELLOW YELLOW   APPearance CLEAR CLEAR   Specific Gravity, Urine 1.012 1.005 - 1.030   pH 5.0 5.0 - 8.0   Glucose, UA 50 (A) NEGATIVE mg/dL   Hgb urine dipstick SMALL (A) NEGATIVE   Bilirubin Urine NEGATIVE NEGATIVE   Ketones, ur NEGATIVE NEGATIVE mg/dL   Protein, ur 100 (A) NEGATIVE mg/dL   Nitrite NEGATIVE NEGATIVE   Leukocytes,Ua NEGATIVE NEGATIVE   RBC / HPF 0-5 0 - 5 RBC/hpf   WBC, UA 0-5 0 - 5 WBC/hpf   Bacteria, UA RARE (A) NONE SEEN   Squamous Epithelial / LPF 0-5 0 - 5   Mucus PRESENT     Comment: Performed at [facility] [address] [place], [place] [address]  I-stat chem 8, ED (not at [facility] or [facility])     Status: Abnormal   Collection Time: 11/29/21  5:36 AM  Result  Value Ref Range   Sodium 140 135 - 145 mmol/L   Potassium 5.1 3.5 - 5.1 mmol/L   Chloride 110 98 - 111 mmol/L   BUN 43 (H) 6 - 20 mg/dL   Creatinine, Ser 6.80 (H) 0.61 - 1.24 mg/dL   Glucose, Bld 165 (H) 70 - 99 mg/dL    Comment: Glucose reference range applies only to samples taken after fasting for at least 8 hours.   Calcium, Ion 1.02 (L) 1.15 - 1.40 mmol/L   TCO2 21 (L) 22 - 32 mmol/L   Hemoglobin 9.9 (L) 13.0 - 17.0 g/dL   HCT 29.0 (L) 39.0 - 52.0 %  HIV Antibody (routine testing w rflx)     Status: None   Collection Time: 11/29/21 11:04 AM  Result Value Ref Range   HIV Screen 4th Generation wRfx Non Reactive Non Reactive    Comment: Performed at [facility] [address], [place], [place] [address]  Basic metabolic panel     Status: Abnormal   Collection Time: 11/29/21 11:04 AM  Result Value Ref Range   Sodium 134 (L) 135 - 145 mmol/L   Potassium 4.9 3.5 - 5.1 mmol/L   Chloride 104 98 - 111 mmol/L   CO2 17 (L) 22 - 32 mmol/L   Glucose, Bld 165 (H) 70 - 99 mg/dL    Comment: Glucose reference range applies only to samples taken after  fasting for at least 8 hours.   BUN 52 (H) 6 - 20 mg/dL   Creatinine, Ser 6.03 (H) 0.61 - 1.24 mg/dL   Calcium 8.2 (L) 8.9 - 10.3 mg/dL   GFR, Estimated 10 (L) >60 mL/min    Comment: (NOTE) Calculated using the CKD-EPI Creatinine Equation (2021)    Anion gap 13 5 - 15    Comment: Performed at [facility] [address] [place], [place] [address]     ROS:  A comprehensive review of systems was negative. He will not admit to uremic sxms  Physical Exam: Vitals:   11/29/21 1234 11/29/21 1400  BP: (!) 151/97 (!) 148/84  Pulse: 78 77  Resp: (!) 29 (!) 25  Temp:    SpO2: 100% 100%     General: alert, talkative-  does not appear to be in acute distress HEENT: PERRLA, EOMI, mucous membranes moist Neck: no JVD Heart: RRR Lungs: audible wheezes- maybe mild dec BS at bases-  he says breathing is better Abdomen: soft, non tender Extremities: 1+ edema-  left upper arm AVF-  is patent  Skin: warm and dry Neuro: alert, non focal   Assessment/Plan: 61 year old with longstanding CKD-  has managed to avoid dialysis for quite some time but kidney function is worsening now -  GFR 10-  he presented with SOB but that has improved with medical management  1.Renal- advanced CKD and now GFR is the lowest it has been at 10.  There are no acute indications to compel me to start dialysis now.  And ,  I honestly feel we will have a difficult time convincing him that he needs since he claims to feel so much better.  He has excuse for low albumin-  has modified his diet for January "janufast" He does agree to re establish with Dr. Justin Mend though as an OP which is probably the best we will do 2. Hypertension/volume  - was overloaded upon arrival-  now 2 doses of IV lasix 1200 UOP measured and feels better-  would just  need to re establish lasix probably 40 PO BID to keep him out of trouble 3. Anemia  - probably has an element of ckd related anemia-  will check iron stores and give ESA prior to  discharge   Louis Meckel 11/29/2021, 2:57 PM

## 2021-11-29 NOTE — Telephone Encounter (Signed)
Call received from

## 2021-11-29 NOTE — Progress Notes (Signed)
This is a 61 year old gentleman with history of CKD stage V, COPD and hypertension and diastolic congestive heart failure who presented to ED with shortness of breath and was diagnosed with acute on chronic diastolic congestive heart failure at that that is progressive CKD stage V.  Please read H&P for details.  Patient seen and examined in the ED.  He has no complaints.  He states that his breathing is much better than yesterday.  He is saturating 98% on room air.  He does have some crackles on lung examination but no wheezes.  No peripheral edema either.  Has had minimal diuresis.  Has been started on IV Lasix 40 daily per nephrology recommendations.  I have verified that nephrology is going to see him as well.  I do not think he has any COPD exacerbation.  No need of steroids.  Will DC Rocephin as well.  His last known creatinine is about 10 months ago it was 4.7 and now it is 6.04.  This is likely progressive CKD stage V instead of AKI.

## 2021-11-29 NOTE — Assessment & Plan Note (Signed)
Continue home BP meds. 

## 2021-11-29 NOTE — Telephone Encounter (Signed)
Pt is currently admitted to the hospital.

## 2021-11-29 NOTE — Telephone Encounter (Signed)
Copied from [place] [id number]. Topic: Quick Communication - Rx Refill/Question >> Nov 28, 2021 10:47 AM [place], Brayton Layman A wrote: Pt stated he really needs his inhaler sent in, was advised that ipratropium-albuterol (DUONEB) was sent in on 1/9 but pt stated he has not been able to pick this up, pt stated he really needs his inhaler, please advise.

## 2021-11-29 NOTE — Assessment & Plan Note (Signed)
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  Not on dialysis yet. EDP called nephro, want [place] to try lasix to see if we can avoid dialysis.

## 2021-11-30 DIAGNOSIS — I5033 Acute on chronic diastolic (congestive) heart failure: Secondary | ICD-10-CM | POA: Diagnosis not present

## 2021-11-30 LAB — RENAL FUNCTION PANEL
Albumin: 2.9 g/dL — ABNORMAL LOW (ref 3.5–5.0)
Anion gap: 10 (ref 5–15)
BUN: 60 mg/dL — ABNORMAL HIGH (ref 6–20)
CO2: 20 mmol/L — ABNORMAL LOW (ref 22–32)
Calcium: 7.9 mg/dL — ABNORMAL LOW (ref 8.9–10.3)
Chloride: 105 mmol/L (ref 98–111)
Creatinine, Ser: 6.02 mg/dL — ABNORMAL HIGH (ref 0.61–1.24)
GFR, Estimated: 10 mL/min — ABNORMAL LOW (ref >60)
Glucose, Bld: 113 mg/dL — ABNORMAL HIGH (ref 70–99)
Phosphorus: 4.1 mg/dL (ref 2.5–4.6)
Potassium: 4.4 mmol/L (ref 3.5–5.1)
Sodium: 135 mmol/L (ref 135–145)

## 2021-11-30 MED ORDER — FUROSEMIDE 40 MG PO TABS
40.0000 mg | ORAL_TABLET | Freq: Two times a day (BID) | ORAL | Status: DC
  Administered 2021-11-30 – 2021-12-01 (×3): 40 mg via ORAL
  Filled 2021-11-30 (×3): qty 1

## 2021-11-30 MED ORDER — ALBUTEROL SULFATE HFA 108 (90 BASE) MCG/ACT IN AERS: 2.0000 | INHALATION_SPRAY | RESPIRATORY_TRACT | Status: DC | PRN

## 2021-11-30 MED ORDER — ISOSORBIDE MONONITRATE ER 30 MG PO TB24
30.0000 mg | ORAL_TABLET | Freq: Every day | ORAL | Status: DC
  Administered 2021-11-30 – 2021-12-01 (×2): 30 mg via ORAL
  Filled 2021-11-30 (×2): qty 1

## 2021-11-30 MED ORDER — ALBUTEROL SULFATE (2.5 MG/3ML) 0.083% IN NEBU
2.5000 mg | INHALATION_SOLUTION | RESPIRATORY_TRACT | Status: DC | PRN
  Administered 2021-11-30 – 2021-12-01 (×2): 2.5 mg via RESPIRATORY_TRACT
  Filled 2021-11-30 (×2): qty 3

## 2021-11-30 MED ORDER — GUAIFENESIN-DM 100-10 MG/5ML PO SYRP
5.0000 mL | ORAL_SOLUTION | ORAL | Status: DC | PRN
  Administered 2021-11-30: 5 mL via ORAL
  Filled 2021-11-30: qty 5

## 2021-11-30 MED ORDER — SODIUM CHLORIDE 0.9 % IV SOLN
250.0000 mg | Freq: Every day | INTRAVENOUS | Status: AC
  Administered 2021-11-30 – 2021-12-01 (×2): 250 mg via INTRAVENOUS
  Filled 2021-11-30 (×2): qty 20

## 2021-11-30 NOTE — TOC Progression Note (Addendum)
Transition of Care [facility]) - Progression Note    Patient Details  Name: JANE BROUGHTON MRN: [id number] Date of Birth: [birth year]  Transition of Care [facility]) CM/SW Contact  Zenon Mayo, RN Phone Number: 11/30/2021, 10:06 AM  Clinical Narrative:     Transition of Care [facility]) Screening Note   Patient Details  Name: ONIX JUMPER Date of Birth: 18-Nov-1960   Transition of Care [facility]) CM/SW Contact:    Zenon Mayo, RN Phone Number: 11/30/2021, 10:06 AM    Transition of Care Department [facility]) has reviewed patient and no TOC needs have been identified at this time. We will continue to monitor patient advancement through interdisciplinary progression rounds. If new patient transition needs arise, please place a TOC consult.  Patient asked this NCM to contact housing authority to see if they can speed up the process of getting him housing.  NCM left message for Ms Baxter Flattery at [phone], and also Ms Lenell Antu at [phone]. Left message for a return call.          Expected Discharge Plan and Services                                                 Social Determinants of Health (SDOH) Interventions    Readmission Risk Interventions No flowsheet data found.

## 2021-11-30 NOTE — Progress Notes (Signed)
PROGRESS NOTE    Johnny [name]  TDV:[id number] DOB: September 28, 1961 DOA: 11/28/2021 PCP: Charlott Rakes, MD    Brief Narrative:  61 year old gentleman with history of CKD stage IV, COPD, hypertension and diastolic dysfunction presented to the emergency room with shortness of breath, diagnosed with acute on chronic diastolic heart failure with progression of CKD stage V.  In the emergency room he was on room air, he had crackles on the lungs.  He was admitted to the hospital due to progressive kidney failure and fluid retention.   Assessment & Plan:   Acute on chronic diastolic heart failure, AKI versus progression of CKD stage V: Anemia of chronic disease No uremic symptoms.  Long history of CKD, AV fistula creation 10 years ago.  Followed by nephrology as outpatient. Symptomatically responded to IV Lasix, convert to oral Lasix today. Patient decided not to start on hemodialysis at this time, nephrology recommended very close outpatient follow-up for possible hemodialysis in near future. Given IV iron x2 today. He was short of breath on mobility, will monitor today for anticipated discharge tomorrow morning.  Essential hypertension: Blood pressures elevated.  Currently on Labetalol 200 mg 3 times a day, amlodipine 10 mg daily.  Patient currently on Nitropatch, changed to long-acting nitro with Imdur 30 mg daily and monitor.  Hypothyroidism: On Synthroid.  Clinically euthyroid.  COPD: No evidence of exacerbation.  Continue bronchodilator therapy.   DVT prophylaxis: heparin injection 5,000 Units Start: 11/29/21 0600   Code Status: Full code Family Communication: None Disposition Plan: Status is: Inpatient  Remains inpatient appropriate because: Persistent shortness of breath.  Abnormal renal functions.         Consultants:  Nephrology  Procedures:  None  Antimicrobials:  None   Subjective: Patient seen and examined.  Early morning rounds, he was completely resting  without symptoms.  Blood pressures were elevated.  He was on Nitropatch. Mobilized for discharge readiness, became short of breath needing frequent breaks.  Will monitor tonight. Recorded urine output 2300 mL last 24 hours.  Objective: Vitals:   11/30/21 0811 11/30/21 0904 11/30/21 1137 11/30/21 1408  BP: (!) 144/108 139/89 (!) 127/93 (!) 143/85  Pulse: 75 78 77 83  Resp: 20  18 20   Temp: 97.8 F (36.6 C)  98.5 F (36.9 C) 98 F (36.7 C)  TempSrc: Oral  Oral Oral  SpO2: 97%  97% 97%  Weight:      Height:        Intake/Output Summary (Last 24 hours) at 11/30/2021 1524 Last data filed at 11/30/2021 1408 Gross per 24 hour  Intake 900 ml  Output 2300 ml  Net -1400 ml   Filed Weights   11/29/21 1900 11/30/21 0411  Weight: 94.1 kg 92.8 kg    Examination:  General exam: Appears calm and comfortable  Respiratory system: Clear to auscultation. Respiratory effort normal. Cardiovascular system: S1 & S2 heard, RRR. No JVD, murmurs, rubs, gallops or clicks. No pedal edema. Gastrointestinal system: Abdomen is nondistended, soft and nontender. No organomegaly or masses felt. Normal bowel sounds heard. Central nervous system: Alert and oriented. No focal neurological deficits. Extremities: Symmetric 5 x 5 power.  AV fistula with thrill left upper extremity. Skin: No rashes, lesions or ulcers Psychiatry: Judgement and insight appear normal. Mood & affect appropriate.     Data Reviewed: I have personally reviewed following labs and imaging studies  CBC: Recent Labs  Lab 11/28/21 2104 11/29/21 0536  WBC 8.4  --   NEUTROABS 5.2  --  HGB 9.4* 9.9*  HCT 28.0* 29.0*  MCV 74.3*  --   PLT 214  --    Basic Metabolic Panel: Recent Labs  Lab 11/28/21 2104 11/29/21 0252 11/29/21 0536 11/29/21 1104 11/30/21 0447  NA 140  --  140 134* 135  K 5.1  --  5.1 4.9 4.4  CL 110  --  110 104 105  CO2 21*  --   --  17* 20*  GLUCOSE 94  --  165* 165* 113*  BUN 42*  --  43* 52* 60*   CREATININE 6.04*  --  6.80* 6.03* 6.02*  CALCIUM 7.8*  --   --  8.2* 7.9*  MG  --  1.9  --   --   --   PHOS  --   --   --   --  4.1   GFR: Estimated Creatinine Clearance: 15.1 mL/min (A) (by C-G formula based on SCr of 6.02 mg/dL (H)). Liver Function Tests: Recent Labs  Lab 11/29/21 0252 11/30/21 0447  AST 28  --   ALT 19  --   ALKPHOS 63  --   BILITOT 0.5  --   PROT 6.4*  --   ALBUMIN 2.6* 2.9*   No results for input(s): LIPASE, AMYLASE in the last 168 hours. No results for input(s): AMMONIA in the last 168 hours. Coagulation Profile: No results for input(s): INR, PROTIME in the last 168 hours. Cardiac Enzymes: No results for input(s): CKTOTAL, CKMB, CKMBINDEX, TROPONINI in the last 168 hours. BNP (last 3 results) No results for input(s): PROBNP in the last 8760 hours. HbA1C: No results for input(s): HGBA1C in the last 72 hours. CBG: No results for input(s): GLUCAP in the last 168 hours. Lipid Profile: No results for input(s): CHOL, HDL, LDLCALC, TRIG, CHOLHDL, LDLDIRECT in the last 72 hours. Thyroid Function Tests: No results for input(s): TSH, T4TOTAL, FREET4, T3FREE, THYROIDAB in the last 72 hours. Anemia Panel: Recent Labs    11/29/21 1925  FERRITIN 427*  TIBC 293  IRON 51   Sepsis Labs: No results for input(s): PROCALCITON, LATICACIDVEN in the last 168 hours.  Recent Results (from the past 240 hour(s))  Resp Panel by RT-PCR (Flu A&B, Covid) Nasopharyngeal Swab     Status: None   Collection Time: 11/28/21  9:35 PM   Specimen: Nasopharyngeal Swab; Nasopharyngeal(NP) swabs in vial transport medium  Result Value Ref Range Status   SARS Coronavirus 2 by RT PCR NEGATIVE NEGATIVE Final    Comment: (NOTE) SARS-CoV-2 target nucleic acids are NOT DETECTED.  The SARS-CoV-2 RNA is generally detectable in upper respiratory specimens during the acute phase of infection. The lowest concentration of SARS-CoV-2 viral copies this assay can detect is 138 copies/mL. A  negative result does not preclude SARS-Cov-2 infection and should not be used as the sole basis for treatment or other patient management decisions. A negative result may occur with  improper specimen collection/handling, submission of specimen other than nasopharyngeal swab, presence of viral mutation(s) within the areas targeted by this assay, and inadequate number of viral copies(<138 copies/mL). A negative result must be combined with clinical observations, patient history, and epidemiological information. The expected result is Negative.  Fact Sheet for Patients:  EntrepreneurPulse.com.au  Fact Sheet for Healthcare Providers:  IncredibleEmployment.be  This test is no t yet approved or cleared by the [place] FDA and  has been authorized for detection and/or diagnosis of SARS-CoV-2 by FDA under an Emergency Use Authorization (EUA). This EUA will remain  in  effect (meaning this test can be used) for the duration of the COVID-19 declaration under Section 564(b)(1) of the Act, 21 U.S.C.section 360bbb-3(b)(1), unless the authorization is terminated  or revoked sooner.       Influenza A by PCR NEGATIVE NEGATIVE Final   Influenza B by PCR NEGATIVE NEGATIVE Final    Comment: (NOTE) The Xpert Xpress SARS-CoV-2/FLU/RSV plus assay is intended as an aid in the diagnosis of influenza from Nasopharyngeal swab specimens and should not be used as a sole basis for treatment. Nasal washings and aspirates are unacceptable for Xpert Xpress SARS-CoV-2/FLU/RSV testing.  Fact Sheet for Patients: EntrepreneurPulse.com.au  Fact Sheet for Healthcare Providers: IncredibleEmployment.be  This test is not yet approved or cleared by the [place] FDA and has been authorized for detection and/or diagnosis of SARS-CoV-2 by FDA under an Emergency Use Authorization (EUA). This EUA will remain in effect (meaning this test can  be used) for the duration of the COVID-19 declaration under Section 564(b)(1) of the Act, 21 U.S.C. section 360bbb-3(b)(1), unless the authorization is terminated or revoked.  Performed at [facility], [place] [address] [place], [place] [address]          Radiology Studies: DG Chest 1 View  Result Date: 11/28/2021 CLINICAL DATA:  Shortness of breath. EXAM: CHEST  1 VIEW COMPARISON:  Chest radiograph dated 09/03/2016 FINDINGS: There is cardiomegaly with vascular congestion and mild edema. No focal consolidation, pleural effusion or pneumothorax. No acute osseous pathology. IMPRESSION: Cardiomegaly with vascular congestion and mild edema. Electronically Signed   By: Anner Crete M.D.   On: 11/28/2021 21:21   ECHOCARDIOGRAM COMPLETE  Result Date: 11/29/2021    ECHOCARDIOGRAM REPORT   Patient Name:   Johnny [name] Date of Exam: 11/29/2021 Medical Rec #:  [id number]     Height:       73.0 in Accession #:    [id number]    Weight:       190.0 lb Date of Birth:  [birth year]     BSA:          2.105 m Patient Age:    53 years      BP:           161/101 mmHg Patient Gender: M             HR:           75 bpm. Exam Location:  Inpatient Procedure: 2D Echo, Cardiac Doppler and Color Doppler Indications:    CHF  History:        Patient has prior history of Echocardiogram examinations, most                 recent 02/10/2021. COPD; Risk Factors:Hypertension. HEPATITIS C.  Sonographer:    Beryle Beams Referring Phys: [phone] JARED M GARDNER  Sonographer Comments: No subcostal window. IMPRESSIONS  1. Left ventricular ejection fraction, by estimation, is 60 to 65%. The left ventricle has normal function. The left ventricle has no regional wall motion abnormalities. The left ventricular internal cavity size was mildly dilated. Left ventricular diastolic parameters are consistent with Grade II diastolic dysfunction (pseudonormalization).  2. Peak RV-RA gradient 39 mmHg. Right ventricular systolic function is  normal. The right ventricular size is normal.  3. Left atrial size was moderately dilated.  4. The mitral valve is normal in structure. Mild mitral valve regurgitation. No evidence of mitral stenosis.  5. The aortic valve is tricuspid. Aortic valve regurgitation is not visualized. No aortic stenosis is  present.  6. A small pericardial effusion is present.  7. The IVC was not visualized. FINDINGS  Left Ventricle: Left ventricular ejection fraction, by estimation, is 60 to 65%. The left ventricle has normal function. The left ventricle has no regional wall motion abnormalities. The left ventricular internal cavity size was mildly dilated. There is  no left ventricular hypertrophy. Left ventricular diastolic parameters are consistent with Grade II diastolic dysfunction (pseudonormalization). Right Ventricle: Peak RV-RA gradient 39 mmHg. The right ventricular size is normal. No increase in right ventricular wall thickness. Right ventricular systolic function is normal. Left Atrium: Left atrial size was moderately dilated. Right Atrium: Right atrial size was normal in size. Pericardium: A small pericardial effusion is present. Mitral Valve: The mitral valve is normal in structure. Mild mitral valve regurgitation. No evidence of mitral valve stenosis. MV peak gradient, 123.7 mmHg. The mean mitral valve gradient is 89.0 mmHg. Tricuspid Valve: The tricuspid valve is normal in structure. Tricuspid valve regurgitation is mild. Aortic Valve: The aortic valve is tricuspid. Aortic valve regurgitation is not visualized. No aortic stenosis is present. Aortic valve mean gradient measures 7.0 mmHg. Aortic valve peak gradient measures 12.2 mmHg. Aortic valve area, by VTI measures 1.49  cm. Pulmonic Valve: The pulmonic valve was normal in structure. Pulmonic valve regurgitation is not visualized. Aorta: The aortic root is normal in size and structure. Venous: The inferior vena cava was not well visualized. IAS/Shunts: No atrial level  shunt detected by color flow Doppler.  LEFT VENTRICLE PLAX 2D LVIDd:         5.90 cm      Diastology LVIDs:         3.70 cm      LV e' medial:    7.94 cm/s LV PW:         1.20 cm      LV E/e' medial:  14.6 LV IVS:        1.00 cm      LV e' lateral:   7.51 cm/s LVOT diam:     1.80 cm      LV E/e' lateral: 15.4 LV SV:         53 LV SV Index:   25 LVOT Area:     2.54 cm  LV Volumes (MOD) LV vol d, MOD A2C: 88.1 ml LV vol d, MOD A4C: 151.0 ml LV vol s, MOD A2C: 30.9 ml LV vol s, MOD A4C: 52.6 ml LV SV MOD A2C:     57.2 ml LV SV MOD A4C:     151.0 ml LV SV MOD BP:      79.0 ml RIGHT VENTRICLE RV S prime:     14.90 cm/s RVOT diam:      2.30 cm TAPSE (M-mode): 3.5 cm LEFT ATRIUM             Index        RIGHT ATRIUM           Index LA diam:        4.90 cm 2.33 cm/m   RA Area:     16.80 cm LA Vol (A2C):   88.5 ml 42.04 ml/m  RA Volume:   39.00 ml  18.53 ml/m LA Vol (A4C):   88.7 ml 42.13 ml/m LA Biplane Vol: 92.8 ml 44.08 ml/m  AORTIC VALVE                     PULMONIC VALVE AV Area (Vmax):    1.53  cm      PV Vmax:       0.85 m/s AV Area (Vmean):   1.44 cm      PV Vmean:      62.400 cm/s AV Area (VTI):     1.49 cm      PV VTI:        0.206 m AV Vmax:           175.00 cm/s   PV Peak grad:  2.9 mmHg AV Vmean:          122.000 cm/s  PV Mean grad:  2.0 mmHg AV VTI:            0.356 m AV Peak Grad:      12.2 mmHg AV Mean Grad:      7.0 mmHg LVOT Vmax:         105.00 cm/s LVOT Vmean:        69.000 cm/s LVOT VTI:          0.209 m LVOT/AV VTI ratio: 0.59  AORTA Ao Root diam: 2.90 cm Ao Asc diam:  2.90 cm MITRAL VALVE                TRICUSPID VALVE MV Area (PHT): 3.50 cm     TV Peak grad:   44.4 mmHg MV Area VTI:   0.27 cm     TV Mean grad:   35.5 mmHg MV Peak grad:  123.7 mmHg   TV Vmax:        3.33 m/s MV Mean grad:  89.0 mmHg    TV Vmean:       286.5 cm/s MV Vmax:       5.56 m/s     TV VTI:         1.20 msec MV Vmean:      452.0 cm/s   TR Peak grad:   39.2 mmHg MV Decel Time: 217 msec     TR Vmax:        313.00 cm/s  MV E velocity: 116.00 cm/s MV A velocity: 70.00 cm/s   SHUNTS MV E/A ratio:  1.66         Systemic VTI:  0.21 m                             Systemic Diam: 1.80 cm                             Pulmonic Diam: 2.30 cm Dalton McleanMD Electronically signed by Franki Monte Signature Date/Time: 11/29/2021/4:03:50 PM    Final         Scheduled Meds:  amLODipine  10 mg Oral QHS   atorvastatin  40 mg Oral Daily   darbepoetin (ARANESP) injection - NON-DIALYSIS  100 mcg Subcutaneous Q Wed-1800   furosemide  40 mg Oral BID   heparin  5,000 Units Subcutaneous Q8H   isosorbide mononitrate  30 mg Oral Daily   labetalol  200 mg Oral TID   levothyroxine  100 mcg Oral QAC breakfast   mometasone-formoterol  2 puff Inhalation BID   sodium chloride flush  3 mL Intravenous Q12H   Continuous Infusions:  sodium chloride     ferric gluconate (FERRLECIT) IVPB 250 mg (11/30/21 1134)     LOS: 1 day    Time spent: 30 minutes    Barb Merino, MD [facility] Pager [phone]

## 2021-11-30 NOTE — Plan of Care (Signed)

## 2021-11-30 NOTE — Progress Notes (Signed)
Subjective:  UOP at least 1700-  crt stable-  he says he feels better-  hosp planning on sending him home today  Objective Vital signs in last 24 hours: Vitals:   11/30/21 0415 11/30/21 0420 11/30/21 0757 11/30/21 0811  BP: (!) 151/97   (!) 144/108  Pulse: 79  77 75  Resp:   20 20  Temp: 97.8 F (36.6 C)   97.8 F (36.6 C)  TempSrc: Oral   Oral  SpO2: 99% 99% 97% 97%  Weight:      Height:       Weight change:   Intake/Output Summary (Last 24 hours) at 11/30/2021 0848 Last data filed at 11/30/2021 0813 Gross per 24 hour  Intake 720 ml  Output 1700 ml  Net -980 ml    Assessment/Plan: 61 year old with longstanding CKD-  has managed to avoid dialysis for quite some time but kidney function is worsening now -  GFR 10-  he presented with SOB but that has improved with medical management  1.Renal- advanced CKD and now GFR is the lowest it has been at 10.  There are no acute indications to compel me to start dialysis now.  And ,  I honestly feel we will have a difficult time convincing him that he needs since he claims to feel so much better.  He has excuse for low albumin-  has modified his diet for January "janufast" He does agree to re establish with Dr. Justin Mend though as an OP which is probably the best we will do 2. Hypertension/volume  - was overloaded upon arrival-  now 2 doses of IV lasix 1200 UOP measured and feels better-  would just need to re establish lasix probably 40 PO BID to keep him out of trouble 3. Anemia  - probably has an element of ckd related anemia-  iron stores low, will replete-  give one dose priotr to discharge- and have given ESA while in house  I am [facility] with discharge after IV iron-  please put on lasix 40 po BID as OP and I will arrange OP follow up with Dr. Aris Lot    Labs: Basic Metabolic Panel: Recent Labs  Lab 11/28/21 2104 11/29/21 0536 11/29/21 1104 11/30/21 0447  NA 140 140 134* 135  K 5.1 5.1 4.9 4.4  CL 110 110 104 105   CO2 21*  --  17* 20*  GLUCOSE 94 165* 165* 113*  BUN 42* 43* 52* 60*  CREATININE 6.04* 6.80* 6.03* 6.02*  CALCIUM 7.8*  --  8.2* 7.9*  PHOS  --   --   --  4.1   Liver Function Tests: Recent Labs  Lab 11/29/21 0252 11/30/21 0447  AST 28  --   ALT 19  --   ALKPHOS 63  --   BILITOT 0.5  --   PROT 6.4*  --   ALBUMIN 2.6* 2.9*   No results for input(s): LIPASE, AMYLASE in the last 168 hours. No results for input(s): AMMONIA in the last 168 hours. CBC: Recent Labs  Lab 11/28/21 2104 11/29/21 0536  WBC 8.4  --   NEUTROABS 5.2  --   HGB 9.4* 9.9*  HCT 28.0* 29.0*  MCV 74.3*  --   PLT 214  --    Cardiac Enzymes: No results for input(s): CKTOTAL, CKMB, CKMBINDEX, TROPONINI in the last 168 hours. CBG: No results for input(s): GLUCAP in the last 168 hours.  Iron Studies:  Recent  Labs    11/29/21 1925  IRON 51  TIBC 293  FERRITIN 427*   Studies/Results: DG Chest 1 View  Result Date: 11/28/2021 CLINICAL DATA:  Shortness of breath. EXAM: CHEST  1 VIEW COMPARISON:  Chest radiograph dated 09/03/2016 FINDINGS: There is cardiomegaly with vascular congestion and mild edema. No focal consolidation, pleural effusion or pneumothorax. No acute osseous pathology. IMPRESSION: Cardiomegaly with vascular congestion and mild edema. Electronically Signed   By: Anner Crete M.D.   On: 11/28/2021 21:21   ECHOCARDIOGRAM COMPLETE  Result Date: 11/29/2021    ECHOCARDIOGRAM REPORT   Patient Name:   Johnny [name] Date of Exam: 11/29/2021 Medical Rec #:  [id number]     Height:       73.0 in Accession #:    [id number]    Weight:       190.0 lb Date of Birth:  04-14-1961     BSA:          2.105 m Patient Age:    83 years      BP:           161/101 mmHg Patient Gender: M             HR:           75 bpm. Exam Location:  Inpatient Procedure: 2D Echo, Cardiac Doppler and Color Doppler Indications:    CHF  History:        Patient has prior history of Echocardiogram examinations, most                  recent 02/10/2021. COPD; Risk Factors:Hypertension. HEPATITIS C.  Sonographer:    Beryle Beams Referring Phys: [phone] JARED M GARDNER  Sonographer Comments: No subcostal window. IMPRESSIONS  1. Left ventricular ejection fraction, by estimation, is 60 to 65%. The left ventricle has normal function. The left ventricle has no regional wall motion abnormalities. The left ventricular internal cavity size was mildly dilated. Left ventricular diastolic parameters are consistent with Grade II diastolic dysfunction (pseudonormalization).  2. Peak RV-RA gradient 39 mmHg. Right ventricular systolic function is normal. The right ventricular size is normal.  3. Left atrial size was moderately dilated.  4. The mitral valve is normal in structure. Mild mitral valve regurgitation. No evidence of mitral stenosis.  5. The aortic valve is tricuspid. Aortic valve regurgitation is not visualized. No aortic stenosis is present.  6. A small pericardial effusion is present.  7. The IVC was not visualized. FINDINGS  Left Ventricle: Left ventricular ejection fraction, by estimation, is 60 to 65%. The left ventricle has normal function. The left ventricle has no regional wall motion abnormalities. The left ventricular internal cavity size was mildly dilated. There is  no left ventricular hypertrophy. Left ventricular diastolic parameters are consistent with Grade II diastolic dysfunction (pseudonormalization). Right Ventricle: Peak RV-RA gradient 39 mmHg. The right ventricular size is normal. No increase in right ventricular wall thickness. Right ventricular systolic function is normal. Left Atrium: Left atrial size was moderately dilated. Right Atrium: Right atrial size was normal in size. Pericardium: A small pericardial effusion is present. Mitral Valve: The mitral valve is normal in structure. Mild mitral valve regurgitation. No evidence of mitral valve stenosis. MV peak gradient, 123.7 mmHg. The mean mitral valve gradient is 89.0 mmHg.  Tricuspid Valve: The tricuspid valve is normal in structure. Tricuspid valve regurgitation is mild. Aortic Valve: The aortic valve is tricuspid. Aortic valve regurgitation is not visualized. No aortic stenosis is present. Aortic valve  mean gradient measures 7.0 mmHg. Aortic valve peak gradient measures 12.2 mmHg. Aortic valve area, by VTI measures 1.49  cm. Pulmonic Valve: The pulmonic valve was normal in structure. Pulmonic valve regurgitation is not visualized. Aorta: The aortic root is normal in size and structure. Venous: The inferior vena cava was not well visualized. IAS/Shunts: No atrial level shunt detected by color flow Doppler.  LEFT VENTRICLE PLAX 2D LVIDd:         5.90 cm      Diastology LVIDs:         3.70 cm      LV e' medial:    7.94 cm/s LV PW:         1.20 cm      LV E/e' medial:  14.6 LV IVS:        1.00 cm      LV e' lateral:   7.51 cm/s LVOT diam:     1.80 cm      LV E/e' lateral: 15.4 LV SV:         53 LV SV Index:   25 LVOT Area:     2.54 cm  LV Volumes (MOD) LV vol d, MOD A2C: 88.1 ml LV vol d, MOD A4C: 151.0 ml LV vol s, MOD A2C: 30.9 ml LV vol s, MOD A4C: 52.6 ml LV SV MOD A2C:     57.2 ml LV SV MOD A4C:     151.0 ml LV SV MOD BP:      79.0 ml RIGHT VENTRICLE RV S prime:     14.90 cm/s RVOT diam:      2.30 cm TAPSE (M-mode): 3.5 cm LEFT ATRIUM             Index        RIGHT ATRIUM           Index LA diam:        4.90 cm 2.33 cm/m   RA Area:     16.80 cm LA Vol (A2C):   88.5 ml 42.04 ml/m  RA Volume:   39.00 ml  18.53 ml/m LA Vol (A4C):   88.7 ml 42.13 ml/m LA Biplane Vol: 92.8 ml 44.08 ml/m  AORTIC VALVE                     PULMONIC VALVE AV Area (Vmax):    1.53 cm      PV Vmax:       0.85 m/s AV Area (Vmean):   1.44 cm      PV Vmean:      62.400 cm/s AV Area (VTI):     1.49 cm      PV VTI:        0.206 m AV Vmax:           175.00 cm/s   PV Peak grad:  2.9 mmHg AV Vmean:          122.000 cm/s  PV Mean grad:  2.0 mmHg AV VTI:            0.356 m AV Peak Grad:      12.2 mmHg AV Mean  Grad:      7.0 mmHg LVOT Vmax:         105.00 cm/s LVOT Vmean:        69.000 cm/s LVOT VTI:          0.209 m LVOT/AV VTI ratio: 0.59  AORTA Ao Root diam: 2.90 cm Ao Asc diam:  2.90 cm MITRAL VALVE                TRICUSPID VALVE MV Area (PHT): 3.50 cm     TV Peak grad:   44.4 mmHg MV Area VTI:   0.27 cm     TV Mean grad:   35.5 mmHg MV Peak grad:  123.7 mmHg   TV Vmax:        3.33 m/s MV Mean grad:  89.0 mmHg    TV Vmean:       286.5 cm/s MV Vmax:       5.56 m/s     TV VTI:         1.20 msec MV Vmean:      452.0 cm/s   TR Peak grad:   39.2 mmHg MV Decel Time: 217 msec     TR Vmax:        313.00 cm/s MV E velocity: 116.00 cm/s MV A velocity: 70.00 cm/s   SHUNTS MV E/A ratio:  1.66         Systemic VTI:  0.21 m                             Systemic Diam: 1.80 cm                             Pulmonic Diam: 2.30 cm Dalton McleanMD Electronically signed by Franki Monte Signature Date/Time: 11/29/2021/4:03:50 PM    Final    Medications: Infusions:  sodium chloride      Scheduled Medications:  amLODipine  10 mg Oral QHS   atorvastatin  40 mg Oral Daily   darbepoetin (ARANESP) injection - NON-DIALYSIS  100 mcg Subcutaneous Q Wed-1800   furosemide  40 mg Intravenous Daily   heparin  5,000 Units Subcutaneous Q8H   isosorbide mononitrate  30 mg Oral Daily   labetalol  200 mg Oral TID   levothyroxine  100 mcg Oral QAC breakfast   mometasone-formoterol  2 puff Inhalation BID   sodium chloride flush  3 mL Intravenous Q12H    have reviewed scheduled and prn medications.  Physical Exam: General:  NAD-  talkative Heart: RRR Lungs: mostly clear Abdomen: soft, non tender  Extremities: trace edema  Dialysis Access: left upper arm AVF-   good thrill and bruit     11/30/2021,8:48 AM  LOS: 1 day

## 2021-11-30 NOTE — TOC Progression Note (Signed)
Transition of Care [facility]) - Progression Note    Patient Details  Name: Johnny [name] MRN: [id number] Date of Birth: 05/22/1961  Transition of Care [facility]) CM/SW Contact  Zenon Mayo, RN Phone Number: 11/30/2021, 4:33 PM  Clinical Narrative:    Patient asked this NCM to contact housing authority to see if they can speed up the process of getting him housing.  NCM left message for Ms Baxter Flattery at [phone], and also Ms Lenell Antu at [phone]. Left message for a return call.  NCM call Ms Darrick Meigs again she states patient is number 31 on the list, and she know that they have 9 senior units coming available mid Feb, but they can not skip over anyone and put him at the top of list because all of their people have the same issues for needing housing.  Patient asked if conditions got worse can he get a emergency voucher, she states she is not in charge of that but gave him a phone number for [facility] [phone].  NCM left message for her to return call and also informed patient to call this number to leave message for her as well.           Expected Discharge Plan and Services                                                 Social Determinants of Health (SDOH) Interventions    Readmission Risk Interventions No flowsheet data found.

## 2021-11-30 NOTE — Progress Notes (Addendum)
Mobility Specialist Progress Note:   11/30/21 1349  Mobility  Activity Ambulated with assistance in hallway  Level of Assistance Independent  Assistive Device None  Distance Ambulated (ft) 400 ft  Activity Response Tolerated well  $Mobility charge 1 Mobility   Pt received in bed willing to participate in mobility. No complaints of pain, pt stated he was SOB upon returning to room. Pt left EOB with call bell in reach and all needs met.   [facility] Public librarian Phone [phone] Secondary Phone [phone]

## 2021-11-30 NOTE — Progress Notes (Signed)
Patient finished iron infusion and walked with MT when patient felt winded and tachypneic and had to stop several times. MD notified. Orthostatic VS obtained after the patient rested for one hour. Orthostatic VS stable.

## 2021-12-01 ENCOUNTER — Other Ambulatory Visit (HOSPITAL_COMMUNITY): Payer: Self-pay

## 2021-12-01 DIAGNOSIS — I5033 Acute on chronic diastolic (congestive) heart failure: Secondary | ICD-10-CM | POA: Diagnosis not present

## 2021-12-01 MED ORDER — ALBUTEROL SULFATE HFA 108 (90 BASE) MCG/ACT IN AERS
2.0000 | INHALATION_SPRAY | Freq: Four times a day (QID) | RESPIRATORY_TRACT | 2 refills | Status: AC | PRN
  Filled 2021-12-01: qty 18, 25d supply, fill #0

## 2021-12-01 MED ORDER — ALBUTEROL SULFATE HFA 108 (90 BASE) MCG/ACT IN AERS: 2.0000 | INHALATION_SPRAY | Freq: Four times a day (QID) | RESPIRATORY_TRACT | 2 refills | Status: DC | PRN

## 2021-12-01 MED ORDER — ISOSORBIDE MONONITRATE ER 30 MG PO TB24
30.0000 mg | ORAL_TABLET | Freq: Every day | ORAL | 0 refills | Status: DC
  Filled 2021-12-01: qty 30, 30d supply, fill #0

## 2021-12-01 MED ORDER — ISOSORBIDE MONONITRATE ER 30 MG PO TB24: 30.0000 mg | ORAL_TABLET | Freq: Every day | ORAL | 0 refills | Status: DC

## 2021-12-01 MED ORDER — LEVOTHYROXINE SODIUM 100 MCG PO TABS: 100.0000 ug | ORAL_TABLET | Freq: Every day | ORAL | 0 refills | Status: DC

## 2021-12-01 MED ORDER — FUROSEMIDE 40 MG PO TABS
40.0000 mg | ORAL_TABLET | Freq: Two times a day (BID) | ORAL | 0 refills | Status: DC
  Filled 2021-12-01: qty 60, 30d supply, fill #0

## 2021-12-01 MED ORDER — LEVOTHYROXINE SODIUM 100 MCG PO TABS
100.0000 ug | ORAL_TABLET | Freq: Every day | ORAL | 0 refills | Status: DC
  Filled 2021-12-01: qty 30, 30d supply, fill #0

## 2021-12-01 MED ORDER — FUROSEMIDE 40 MG PO TABS: 40.0000 mg | ORAL_TABLET | Freq: Two times a day (BID) | ORAL | 0 refills | Status: DC

## 2021-12-01 MED ORDER — ATORVASTATIN CALCIUM 40 MG PO TABS: 40.0000 mg | ORAL_TABLET | Freq: Every day | ORAL | 2 refills | Status: DC

## 2021-12-01 MED ORDER — ATORVASTATIN CALCIUM 40 MG PO TABS
40.0000 mg | ORAL_TABLET | Freq: Every day | ORAL | 2 refills | Status: DC
  Filled 2021-12-01: qty 30, 30d supply, fill #0

## 2021-12-01 NOTE — Progress Notes (Signed)
Mobility Specialist Progress Note:   12/01/21 1027  Mobility  Activity Ambulated with assistance in hallway  Level of Assistance Independent  Assistive Device None  Distance Ambulated (ft) 520 ft  Activity Response Tolerated well  $Mobility charge 1 Mobility   Pt received in bed willing to participate in mobility. No complaints of pain, a little SOB. Left EOB with call bell in reach and all needs met.   [facility] Public librarian Phone [phone] Secondary Phone [phone]

## 2021-12-01 NOTE — Discharge Summary (Signed)
Physician Discharge Summary  Johnny [name] ZDG:[id number] DOB: 18-May-1961 DOA: 11/28/2021  PCP: Johnny Rakes, MD  Admit date: 11/28/2021 Discharge date: 12/01/2021  Admitted From: Home Disposition: Home  Recommendations for Outpatient Follow-up:  Follow up with PCP in 1-2 weeks Please obtain BMP/CBC in one week Continue to follow-up with [facility], they will schedule follow-up.  Home Health: N/A Equipment/Devices: N/A  Discharge Condition: Stable CODE STATUS: Full code Diet recommendation: Low-salt diet.  Discharge summary: 61 year old gentleman with history of CKD stage IV, COPD, hypertension and diastolic dysfunction presented to the emergency room with shortness of breath, diagnosed with acute on chronic diastolic heart failure with progression of CKD stage V.  In the emergency room he was on room air, he had crackles on the lungs.  He was admitted to the hospital due to progressive kidney failure and fluid retention.   Shortness of breath, acute on chronic diastolic heart failure, AKI versus progression of CKD stage V: Anemia of chronic disease  No uremic symptoms.  Long history of CKD, AV fistula creation 10 years ago.  Followed by nephrology as outpatient. Symptomatically responded to IV Lasix and converted to oral Lasix. Patient decided not to start on hemodialysis at this time, nephrology recommended very close outpatient follow-up for possible hemodialysis in near future. Received IV iron. Underlying COPD also contributing significant to his symptoms. Continue Dulera. Continue nebulizer and also prescribed inhaler therapy.   Essential hypertension: Blood pressures better on labetalol 200 mg 3 times a day, amlodipine 10 mg daily and added Imdur 30 mg daily.    Hypothyroidism: On Synthroid.  Clinically euthyroid.  Continue Synthroid.  Patient stable today.  Able to go home with very close outpatient follow-up with nephrology.   Discharge Diagnoses:  Principal  Problem:   Acute on chronic diastolic CHF (congestive heart failure) (HCC) Active Problems:   COPD (chronic obstructive pulmonary disease) (HCC)   CKD (chronic kidney disease) stage 4, GFR 15-29 ml/min (HCC)   Essential hypertension   AKI (acute kidney injury) [facility])    Discharge Instructions  Discharge Instructions     Call MD for:  difficulty breathing, headache or visual disturbances   Complete by: As directed    Diet - low sodium heart healthy   Complete by: As directed    Increase activity slowly   Complete by: As directed       Allergies as of 12/01/2021       Reactions   Naproxen Other (See Comments)   Unknown        Medication List     TAKE these medications    3 Series BP Monitor/Upper Arm Devi 1 Units by Does not apply route 2 (two) times daily.   albuterol 108 (90 Base) MCG/ACT inhaler Commonly known as: VENTOLIN HFA Inhale 2 puffs into the lungs every 6 (six) hours as needed for wheezing or shortness of breath. What changed: See the new instructions.   allopurinol 100 MG tablet Commonly known as: ZYLOPRIM Take 2 tablets (200 mg total) by mouth daily. What changed: how much to take   amLODipine 10 MG tablet Commonly known as: NORVASC Take 1 tablet by mouth once daily What changed: when to take this   atorvastatin 40 MG tablet Commonly known as: LIPITOR Take 1 tablet (40 mg total) by mouth daily.   clotrimazole-betamethasone cream Commonly known as: LOTRISONE Apply to affected area 2 times daily.   Colchicine 0.6 MG Caps Commonly known as: Mitigare Take 2 capsules (1.2 mg) orally at  the onset of a gout flare then repeat 1 capsule (0.6 mg) in 1 hour if pain continues. What changed:  how much to take how to take this when to take this reasons to take this additional instructions   Dulera 100-5 MCG/ACT Aero Generic drug: mometasone-formoterol Inhale 2 puffs into the lungs 2 (two) times daily.   furosemide 40 MG tablet Commonly known  as: LASIX Take 1 tablet (40 mg total) by mouth 2 (two) times daily. What changed:  medication strength how much to take when to take this reasons to take this   ipratropium-albuterol 0.5-2.5 (3) MG/3ML Soln Commonly known as: DUONEB USE 1 AMPULE IN NEBULIZER EVERY 6 HOURS AS NEEDED What changed: See the new instructions.   isosorbide mononitrate 30 MG 24 hr tablet Commonly known as: IMDUR Take 1 tablet (30 mg total) by mouth daily.   labetalol 200 MG tablet Commonly known as: NORMODYNE TAKE 1 TABLET BY MOUTH THREE TIMES DAILY   levothyroxine 100 MCG tablet Commonly known as: SYNTHROID Take 1 tablet (100 mcg total) by mouth daily. What changed: when to take this   Misc. Devices Misc Blood Pressure monitor. Dx: HTN   triamcinolone cream 0.1 % Commonly known as: KENALOG Apply 1 application topically 2 (two) times daily.        Follow-up Information     Johnny Rakes, MD Follow up in 2 week(s).   Specialty: Family Medicine Contact information: [place] [address] [phone]         Johnny Oh, MD Follow up in 1 week(s).   Specialty: Nephrology Contact information: [place] [address] [phone]                Allergies  Allergen Reactions   Naproxen Other (See Comments)    Unknown    Consultations: Nephrology   Procedures/Studies: DG Chest 1 View  Result Date: 11/28/2021 CLINICAL DATA:  Shortness of breath. EXAM: CHEST  1 VIEW COMPARISON:  Chest radiograph dated 09/03/2016 FINDINGS: There is cardiomegaly with vascular congestion and mild edema. No focal consolidation, pleural effusion or pneumothorax. No acute osseous pathology. IMPRESSION: Cardiomegaly with vascular congestion and mild edema. Electronically Signed   By: Anner Crete M.D.   On: 11/28/2021 21:21   ECHOCARDIOGRAM COMPLETE  Result Date: 11/29/2021    ECHOCARDIOGRAM REPORT   Patient Name:   Johnny [name] Date of Exam: 11/29/2021  Medical Rec #:  [id number]     Height:       73.0 in Accession #:    [id number]    Weight:       190.0 lb Date of Birth:  1961-03-23     BSA:          2.105 m Patient Age:    70 years      BP:           161/101 mmHg Patient Gender: M             HR:           75 bpm. Exam Location:  Inpatient Procedure: 2D Echo, Cardiac Doppler and Color Doppler Indications:    CHF  History:        Patient has prior history of Echocardiogram examinations, most                 recent 02/10/2021. COPD; Risk Factors:Hypertension. HEPATITIS C.  Sonographer:    Beryle Beams Referring Phys: [phone] JARED M GARDNER  Sonographer Comments: No subcostal  window. IMPRESSIONS  1. Left ventricular ejection fraction, by estimation, is 60 to 65%. The left ventricle has normal function. The left ventricle has no regional wall motion abnormalities. The left ventricular internal cavity size was mildly dilated. Left ventricular diastolic parameters are consistent with Grade II diastolic dysfunction (pseudonormalization).  2. Peak RV-RA gradient 39 mmHg. Right ventricular systolic function is normal. The right ventricular size is normal.  3. Left atrial size was moderately dilated.  4. The mitral valve is normal in structure. Mild mitral valve regurgitation. No evidence of mitral stenosis.  5. The aortic valve is tricuspid. Aortic valve regurgitation is not visualized. No aortic stenosis is present.  6. A small pericardial effusion is present.  7. The IVC was not visualized. FINDINGS  Left Ventricle: Left ventricular ejection fraction, by estimation, is 60 to 65%. The left ventricle has normal function. The left ventricle has no regional wall motion abnormalities. The left ventricular internal cavity size was mildly dilated. There is  no left ventricular hypertrophy. Left ventricular diastolic parameters are consistent with Grade II diastolic dysfunction (pseudonormalization). Right Ventricle: Peak RV-RA gradient 39 mmHg. The right ventricular size is  normal. No increase in right ventricular wall thickness. Right ventricular systolic function is normal. Left Atrium: Left atrial size was moderately dilated. Right Atrium: Right atrial size was normal in size. Pericardium: A small pericardial effusion is present. Mitral Valve: The mitral valve is normal in structure. Mild mitral valve regurgitation. No evidence of mitral valve stenosis. MV peak gradient, 123.7 mmHg. The mean mitral valve gradient is 89.0 mmHg. Tricuspid Valve: The tricuspid valve is normal in structure. Tricuspid valve regurgitation is mild. Aortic Valve: The aortic valve is tricuspid. Aortic valve regurgitation is not visualized. No aortic stenosis is present. Aortic valve mean gradient measures 7.0 mmHg. Aortic valve peak gradient measures 12.2 mmHg. Aortic valve area, by VTI measures 1.49  cm. Pulmonic Valve: The pulmonic valve was normal in structure. Pulmonic valve regurgitation is not visualized. Aorta: The aortic root is normal in size and structure. Venous: The inferior vena cava was not well visualized. IAS/Shunts: No atrial level shunt detected by color flow Doppler.  LEFT VENTRICLE PLAX 2D LVIDd:         5.90 cm      Diastology LVIDs:         3.70 cm      LV e' medial:    7.94 cm/s LV PW:         1.20 cm      LV E/e' medial:  14.6 LV IVS:        1.00 cm      LV e' lateral:   7.51 cm/s LVOT diam:     1.80 cm      LV E/e' lateral: 15.4 LV SV:         53 LV SV Index:   25 LVOT Area:     2.54 cm  LV Volumes (MOD) LV vol d, MOD A2C: 88.1 ml LV vol d, MOD A4C: 151.0 ml LV vol s, MOD A2C: 30.9 ml LV vol s, MOD A4C: 52.6 ml LV SV MOD A2C:     57.2 ml LV SV MOD A4C:     151.0 ml LV SV MOD BP:      79.0 ml RIGHT VENTRICLE RV S prime:     14.90 cm/s RVOT diam:      2.30 cm TAPSE (M-mode): 3.5 cm LEFT ATRIUM  Index        RIGHT ATRIUM           Index LA diam:        4.90 cm 2.33 cm/m   RA Area:     16.80 cm LA Vol (A2C):   88.5 ml 42.04 ml/m  RA Volume:   39.00 ml  18.53 ml/m LA Vol  (A4C):   88.7 ml 42.13 ml/m LA Biplane Vol: 92.8 ml 44.08 ml/m  AORTIC VALVE                     PULMONIC VALVE AV Area (Vmax):    1.53 cm      PV Vmax:       0.85 m/s AV Area (Vmean):   1.44 cm      PV Vmean:      62.400 cm/s AV Area (VTI):     1.49 cm      PV VTI:        0.206 m AV Vmax:           175.00 cm/s   PV Peak grad:  2.9 mmHg AV Vmean:          122.000 cm/s  PV Mean grad:  2.0 mmHg AV VTI:            0.356 m AV Peak Grad:      12.2 mmHg AV Mean Grad:      7.0 mmHg LVOT Vmax:         105.00 cm/s LVOT Vmean:        69.000 cm/s LVOT VTI:          0.209 m LVOT/AV VTI ratio: 0.59  AORTA Ao Root diam: 2.90 cm Ao Asc diam:  2.90 cm MITRAL VALVE                TRICUSPID VALVE MV Area (PHT): 3.50 cm     TV Peak grad:   44.4 mmHg MV Area VTI:   0.27 cm     TV Mean grad:   35.5 mmHg MV Peak grad:  123.7 mmHg   TV Vmax:        3.33 m/s MV Mean grad:  89.0 mmHg    TV Vmean:       286.5 cm/s MV Vmax:       5.56 m/s     TV VTI:         1.20 msec MV Vmean:      452.0 cm/s   TR Peak grad:   39.2 mmHg MV Decel Time: 217 msec     TR Vmax:        313.00 cm/s MV E velocity: 116.00 cm/s MV A velocity: 70.00 cm/s   SHUNTS MV E/A ratio:  1.66         Systemic VTI:  0.21 m                             Systemic Diam: 1.80 cm                             Pulmonic Diam: 2.30 cm Dalton McleanMD Electronically signed by Franki Monte Signature Date/Time: 11/29/2021/4:03:50 PM    Final    (Echo, Carotid, EGD, Colonoscopy, ERCP)    Subjective: Patient seen and examined.  He had some wheezing overnight and some tightness of the chest but feels better  today.  Walked around and eager to go home.  Aware about need for close nephrology follow-up.   Discharge Exam: Vitals:   12/01/21 0730 12/01/21 0800  BP: (!) 143/94   Pulse: 78   Resp: 19   Temp: 98.4 F (36.9 C)   SpO2: 96% 97%   Vitals:   12/01/21 0044 12/01/21 0429 12/01/21 0730 12/01/21 0800  BP: (!) 134/94 (!) 139/93 (!) 143/94   Pulse: 79 78 78   Resp: 18 18  19    Temp: 98.6 F (37 C) 98.3 F (36.8 C) 98.4 F (36.9 C)   TempSrc: Oral Oral Oral   SpO2: 94% 98% 96% 97%  Weight: 91.2 kg     Height:        General: Pt is alert, awake, not in acute distress.  Currently on room air. Cardiovascular: RRR, S1/S2 +, no rubs, no gallops Respiratory: Occasional expiratory wheezes present.  Otherwise good air entry bilateral. Abdominal: Soft, NT, ND, bowel sounds + Extremities: no edema, no cyanosis He has left antecubital AV fistula with thrill.    The results of significant diagnostics from this hospitalization (including imaging, microbiology, ancillary and laboratory) are listed below for reference.     Microbiology: Recent Results (from the past 240 hour(s))  Resp Panel by RT-PCR (Flu A&B, Covid) Nasopharyngeal Swab     Status: None   Collection Time: 11/28/21  9:35 PM   Specimen: Nasopharyngeal Swab; Nasopharyngeal(NP) swabs in vial transport medium  Result Value Ref Range Status   SARS Coronavirus 2 by RT PCR NEGATIVE NEGATIVE Final    Comment: (NOTE) SARS-CoV-2 target nucleic acids are NOT DETECTED.  The SARS-CoV-2 RNA is generally detectable in upper respiratory specimens during the acute phase of infection. The lowest concentration of SARS-CoV-2 viral copies this assay can detect is 138 copies/mL. A negative result does not preclude SARS-Cov-2 infection and should not be used as the sole basis for treatment or other patient management decisions. A negative result may occur with  improper specimen collection/handling, submission of specimen other than nasopharyngeal swab, presence of viral mutation(s) within the areas targeted by this assay, and inadequate number of viral copies(<138 copies/mL). A negative result must be combined with clinical observations, patient history, and epidemiological information. The expected result is Negative.  Fact Sheet for Patients:  EntrepreneurPulse.com.au  Fact Sheet for  Healthcare Providers:  IncredibleEmployment.be  This test is no t yet approved or cleared by the [place] FDA and  has been authorized for detection and/or diagnosis of SARS-CoV-2 by FDA under an Emergency Use Authorization (EUA). This EUA will remain  in effect (meaning this test can be used) for the duration of the COVID-19 declaration under Section 564(b)(1) of the Act, 21 U.S.C.section 360bbb-3(b)(1), unless the authorization is terminated  or revoked sooner.       Influenza A by PCR NEGATIVE NEGATIVE Final   Influenza B by PCR NEGATIVE NEGATIVE Final    Comment: (NOTE) The Xpert Xpress SARS-CoV-2/FLU/RSV plus assay is intended as an aid in the diagnosis of influenza from Nasopharyngeal swab specimens and should not be used as a sole basis for treatment. Nasal washings and aspirates are unacceptable for Xpert Xpress SARS-CoV-2/FLU/RSV testing.  Fact Sheet for Patients: EntrepreneurPulse.com.au  Fact Sheet for Healthcare Providers: IncredibleEmployment.be  This test is not yet approved or cleared by the [place] FDA and has been authorized for detection and/or diagnosis of SARS-CoV-2 by FDA under an Emergency Use Authorization (EUA). This EUA will remain in effect (meaning this  test can be used) for the duration of the COVID-19 declaration under Section 564(b)(1) of the Act, 21 U.S.C. section 360bbb-3(b)(1), unless the authorization is terminated or revoked.  Performed at [facility], [place] [address] [place], [place] [address]      Labs: BNP (last 3 results) Recent Labs    11/28/21 2105  BNP 3,762.8*   Basic Metabolic Panel: Recent Labs  Lab 11/28/21 2104 11/29/21 0252 11/29/21 0536 11/29/21 1104 11/30/21 0447  NA 140  --  140 134* 135  K 5.1  --  5.1 4.9 4.4  CL 110  --  110 104 105  CO2 21*  --   --  17* 20*  GLUCOSE 94  --  165* 165* 113*  BUN 42*  --  43* 52* 60*  CREATININE  6.04*  --  6.80* 6.03* 6.02*  CALCIUM 7.8*  --   --  8.2* 7.9*  MG  --  1.9  --   --   --   PHOS  --   --   --   --  4.1   Liver Function Tests: Recent Labs  Lab 11/29/21 0252 11/30/21 0447  AST 28  --   ALT 19  --   ALKPHOS 63  --   BILITOT 0.5  --   PROT 6.4*  --   ALBUMIN 2.6* 2.9*   No results for input(s): LIPASE, AMYLASE in the last 168 hours. No results for input(s): AMMONIA in the last 168 hours. CBC: Recent Labs  Lab 11/28/21 2104 11/29/21 0536  WBC 8.4  --   NEUTROABS 5.2  --   HGB 9.4* 9.9*  HCT 28.0* 29.0*  MCV 74.3*  --   PLT 214  --    Cardiac Enzymes: No results for input(s): CKTOTAL, CKMB, CKMBINDEX, TROPONINI in the last 168 hours. BNP: Invalid input(s): POCBNP CBG: No results for input(s): GLUCAP in the last 168 hours. D-Dimer No results for input(s): DDIMER in the last 72 hours. Hgb A1c No results for input(s): HGBA1C in the last 72 hours. Lipid Profile No results for input(s): CHOL, HDL, LDLCALC, TRIG, CHOLHDL, LDLDIRECT in the last 72 hours. Thyroid function studies No results for input(s): TSH, T4TOTAL, T3FREE, THYROIDAB in the last 72 hours.  Invalid input(s): FREET3 Anemia work up Recent Labs    11/29/21 1925  FERRITIN 427*  TIBC 293  IRON 51   Urinalysis    Component Value Date/Time   COLORURINE YELLOW 11/29/2021 0501   APPEARANCEUR CLEAR 11/29/2021 0501   LABSPEC 1.012 11/29/2021 0501   PHURINE 5.0 11/29/2021 0501   GLUCOSEU 50 (A) 11/29/2021 0501   HGBUR SMALL (A) 11/29/2021 0501   BILIRUBINUR NEGATIVE 11/29/2021 0501   BILIRUBINUR negative 04/10/2019 1634   KETONESUR NEGATIVE 11/29/2021 0501   PROTEINUR 100 (A) 11/29/2021 0501   UROBILINOGEN 1.0 04/10/2019 1634   UROBILINOGEN 4.0 (H) 06/02/2018 2054   NITRITE NEGATIVE 11/29/2021 0501   LEUKOCYTESUR NEGATIVE 11/29/2021 0501   Sepsis Labs Invalid input(s): PROCALCITONIN,  WBC,  LACTICIDVEN Microbiology Recent Results (from the past 240 hour(s))  Resp Panel by  RT-PCR (Flu A&B, Covid) Nasopharyngeal Swab     Status: None   Collection Time: 11/28/21  9:35 PM   Specimen: Nasopharyngeal Swab; Nasopharyngeal(NP) swabs in vial transport medium  Result Value Ref Range Status   SARS Coronavirus 2 by RT PCR NEGATIVE NEGATIVE Final    Comment: (NOTE) SARS-CoV-2 target nucleic acids are NOT DETECTED.  The SARS-CoV-2 RNA is generally detectable in upper  respiratory specimens during the acute phase of infection. The lowest concentration of SARS-CoV-2 viral copies this assay can detect is 138 copies/mL. A negative result does not preclude SARS-Cov-2 infection and should not be used as the sole basis for treatment or other patient management decisions. A negative result may occur with  improper specimen collection/handling, submission of specimen other than nasopharyngeal swab, presence of viral mutation(s) within the areas targeted by this assay, and inadequate number of viral copies(<138 copies/mL). A negative result must be combined with clinical observations, patient history, and epidemiological information. The expected result is Negative.  Fact Sheet for Patients:  EntrepreneurPulse.com.au  Fact Sheet for Healthcare Providers:  IncredibleEmployment.be  This test is no t yet approved or cleared by the [place] FDA and  has been authorized for detection and/or diagnosis of SARS-CoV-2 by FDA under an Emergency Use Authorization (EUA). This EUA will remain  in effect (meaning this test can be used) for the duration of the COVID-19 declaration under Section 564(b)(1) of the Act, 21 U.S.C.section 360bbb-3(b)(1), unless the authorization is terminated  or revoked sooner.       Influenza A by PCR NEGATIVE NEGATIVE Final   Influenza B by PCR NEGATIVE NEGATIVE Final    Comment: (NOTE) The Xpert Xpress SARS-CoV-2/FLU/RSV plus assay is intended as an aid in the diagnosis of influenza from Nasopharyngeal swab  specimens and should not be used as a sole basis for treatment. Nasal washings and aspirates are unacceptable for Xpert Xpress SARS-CoV-2/FLU/RSV testing.  Fact Sheet for Patients: EntrepreneurPulse.com.au  Fact Sheet for Healthcare Providers: IncredibleEmployment.be  This test is not yet approved or cleared by the [place] FDA and has been authorized for detection and/or diagnosis of SARS-CoV-2 by FDA under an Emergency Use Authorization (EUA). This EUA will remain in effect (meaning this test can be used) for the duration of the COVID-19 declaration under Section 564(b)(1) of the Act, 21 U.S.C. section 360bbb-3(b)(1), unless the authorization is terminated or revoked.  Performed at [facility], [place] [address] [place], [place] [address]      Time coordinating discharge: 40 minutes  SIGNED:   Barb Merino, MD  [facility] 12/01/2021, 10:56 AM

## 2021-12-01 NOTE — Progress Notes (Signed)
Nsg Discharge Note  Admit Date:  11/28/2021 Discharge date: 12/01/2021   Johnny [name] to be D/C'd Home per MD order.  AVS completed. Patient/caregiver able to verbalize understanding.  Discharge Medication: Allergies as of 12/01/2021       Reactions   Naproxen Other (See Comments)   Unknown        Medication List     TAKE these medications    3 Series BP Monitor/Upper Arm Devi 1 Units by Does not apply route 2 (two) times daily.   albuterol 108 (90 Base) MCG/ACT inhaler Commonly known as: VENTOLIN HFA Inhale 2 puffs into the lungs every 6 (six) hours as needed for wheezing or shortness of breath. What changed: See the new instructions.   allopurinol 100 MG tablet Commonly known as: ZYLOPRIM Take 2 tablets (200 mg total) by mouth daily. What changed: how much to take   amLODipine 10 MG tablet Commonly known as: NORVASC Take 1 tablet by mouth once daily What changed: when to take this   atorvastatin 40 MG tablet Commonly known as: LIPITOR Take 1 tablet (40 mg total) by mouth daily.   clotrimazole-betamethasone cream Commonly known as: LOTRISONE Apply to affected area 2 times daily.   Colchicine 0.6 MG Caps Commonly known as: Mitigare Take 2 capsules (1.2 mg) orally at the onset of a gout flare then repeat 1 capsule (0.6 mg) in 1 hour if pain continues. What changed:  how much to take how to take this when to take this reasons to take this additional instructions   Dulera 100-5 MCG/ACT Aero Generic drug: mometasone-formoterol Inhale 2 puffs into the lungs 2 (two) times daily.   furosemide 40 MG tablet Commonly known as: LASIX Take 1 tablet (40 mg total) by mouth 2 (two) times daily. What changed:  medication strength how much to take when to take this reasons to take this   ipratropium-albuterol 0.5-2.5 (3) MG/3ML Soln Commonly known as: DUONEB USE 1 AMPULE IN NEBULIZER EVERY 6 HOURS AS NEEDED What changed: See the new instructions.   isosorbide  mononitrate 30 MG 24 hr tablet Commonly known as: IMDUR Take 1 tablet (30 mg total) by mouth daily.   labetalol 200 MG tablet Commonly known as: NORMODYNE TAKE 1 TABLET BY MOUTH THREE TIMES DAILY   levothyroxine 100 MCG tablet Commonly known as: SYNTHROID Take 1 tablet (100 mcg total) by mouth daily. What changed: when to take this   Misc. Devices Misc Blood Pressure monitor. Dx: HTN   triamcinolone cream 0.1 % Commonly known as: KENALOG Apply 1 application topically 2 (two) times daily.        Discharge Assessment: Vitals:   12/01/21 0800 12/01/21 1146  BP:  (!) 140/100  Pulse:  79  Resp:  18  Temp:  98.4 F (36.9 C)  SpO2: 97% 99%   Skin clean, dry and intact without evidence of skin break down, no evidence of skin tears noted. IV catheter discontinued intact. Site without signs and symptoms of complications - no redness or edema noted at insertion site, patient denies c/o pain - only slight tenderness at site.  Dressing with slight pressure applied.  D/c Instructions-Education: Discharge instructions given to patient/family with verbalized understanding. D/c education completed with patient/family including follow up instructions, medication list, d/c activities limitations if indicated, with other d/c instructions as indicated by MD - patient able to verbalize understanding, all questions fully answered. Patient instructed to return to ED, call 911, or call MD for any changes in condition.  Patient escorted via [place], and D/C home via [place].  Johnny Ina, RN 12/01/2021 12:36 PM

## 2021-12-01 NOTE — Progress Notes (Signed)
Subjective:  Ended up staying last night -  was winded with activity-  had 4300 of UOP-  negative 3 liters -  plan according to him is to send him home today -  I dont have any repeat chemistries-  pt denies uremic sxms   Objective Vital signs in last 24 hours: Vitals:   12/01/21 0044 12/01/21 0429 12/01/21 0730 12/01/21 0800  BP: (!) 134/94 (!) 139/93 (!) 143/94   Pulse: 79 78 78   Resp: 18 18 19    Temp: 98.6 F (37 C) 98.3 F (36.8 C) 98.4 F (36.9 C)   TempSrc: Oral Oral Oral   SpO2: 94% 98% 96% 97%  Weight: 91.2 kg     Height:       Weight change: -2.882 kg  Intake/Output Summary (Last 24 hours) at 12/01/2021 1036 Last data filed at 12/01/2021 0825 Gross per 24 hour  Intake 1179.6 ml  Output 3500 ml  Net -2320.4 ml    Assessment/Plan: 61 year old with longstanding CKD-  has managed to avoid dialysis for quite some time but kidney function is worsening now -  GFR 10-  he presented with SOB but that has improved with medical management  1.Renal- advanced CKD and now GFR is the lowest it has been at 10.  There are no acute indications to compel me to start dialysis now.  And ,  I honestly feel we will have a difficult time convincing him that he needs since he claims to feel so much better and has avoided it for so long.   He has excuse for low albumin-  has modified his diet for January "janufast" He does agree to re establish with Dr. Justin Mend though as an OP which is probably the best we will do.  I have attempted again to explain to him that he is getting closer to requiring dialysis therapy-  not sure he believes  2. Hypertension/volume  - was overloaded upon arrival-  now 2 doses of IV lasix 1200 UOP measured and feels better-  would just need to re establish lasix probably 40 PO BID to keep him out of trouble-  did that yesterday and had good diuresis  3. Anemia  - probably has an element of ckd related anemia-  iron stores low, will replete-  gave 2 doses of iron prior to discharge-  and have given ESA while in house  I am [facility] with discharge-  please put on lasix 40 po BID as OP and I will arrange OP follow up with Dr. Aris Lot    Labs: Basic Metabolic Panel: Recent Labs  Lab 11/28/21 2104 11/29/21 0536 11/29/21 1104 11/30/21 0447  NA 140 140 134* 135  K 5.1 5.1 4.9 4.4  CL 110 110 104 105  CO2 21*  --  17* 20*  GLUCOSE 94 165* 165* 113*  BUN 42* 43* 52* 60*  CREATININE 6.04* 6.80* 6.03* 6.02*  CALCIUM 7.8*  --  8.2* 7.9*  PHOS  --   --   --  4.1   Liver Function Tests: Recent Labs  Lab 11/29/21 0252 11/30/21 0447  AST 28  --   ALT 19  --   ALKPHOS 63  --   BILITOT 0.5  --   PROT 6.4*  --   ALBUMIN 2.6* 2.9*   No results for input(s): LIPASE, AMYLASE in the last 168 hours. No results for input(s): AMMONIA in the last 168 hours. CBC: Recent Labs  Lab 11/28/21 2104 11/29/21 0536  WBC 8.4  --   NEUTROABS 5.2  --   HGB 9.4* 9.9*  HCT 28.0* 29.0*  MCV 74.3*  --   PLT 214  --    Cardiac Enzymes: No results for input(s): CKTOTAL, CKMB, CKMBINDEX, TROPONINI in the last 168 hours. CBG: No results for input(s): GLUCAP in the last 168 hours.  Iron Studies:  Recent Labs    11/29/21 1925  IRON 51  TIBC 293  FERRITIN 427*   Studies/Results: No results found. Medications: Infusions:  sodium chloride      Scheduled Medications:  amLODipine  10 mg Oral QHS   atorvastatin  40 mg Oral Daily   darbepoetin (ARANESP) injection - NON-DIALYSIS  100 mcg Subcutaneous Q Wed-1800   furosemide  40 mg Oral BID   heparin  5,000 Units Subcutaneous Q8H   isosorbide mononitrate  30 mg Oral Daily   labetalol  200 mg Oral TID   levothyroxine  100 mcg Oral QAC breakfast   mometasone-formoterol  2 puff Inhalation BID   sodium chloride flush  3 mL Intravenous Q12H    have reviewed scheduled and prn medications.  Physical Exam: General:  NAD-  talkative Heart: RRR Lungs: mostly clear Abdomen: soft, non tender  Extremities:  trace edema  Dialysis Access: left upper arm AVF-  aneurysmal but good thrill and bruit     12/01/2021,10:36 AM  LOS: 2 days

## 2021-12-01 NOTE — TOC Transition Note (Signed)
Transition of Care [facility]) - CM/SW Discharge Note   Patient Details  Name: Johnny [name] MRN: [id number] Date of Birth: [birth year]  Transition of Care [facility] CM/SW Contact:  Zenon Mayo, RN Phone Number: 12/01/2021, 10:53 AM   Clinical Narrative:    For discharge today  ,he is getting iron infused, after that he will get transport home.         Patient Goals and CMS Choice        Discharge Placement                       Discharge Plan and Services                                     Social Determinants of Health (SDOH) Interventions     Readmission Risk Interventions No flowsheet data found.

## 2021-12-01 NOTE — Consult Note (Signed)
° °  [facility] Inpatient Consult   12/01/2021  Johnny [name] 07-Mar-1961 [id number]  Managed Medicaid: [place]  Patient was reviewed for high risk Managed Medicaid team.  Chart reviewed for follow up needs. Call placed to bedside phone but no answer then call placed to cell phone listed, patient answered, HIPAA verified.  Explained to patient that he may qualify for Managed Medicaid services.  Patient agreeable for follow up.  Plan:  To refer patient to the Managed Medicaid team for support and disease management.  Natividad Brood, RN BSN [facility] Liaison  [phone] business mobile phone Toll free office [phone]  Fax number: [phone] [email or web address]

## 2021-12-04 ENCOUNTER — Telehealth: Payer: Self-pay

## 2021-12-04 NOTE — Telephone Encounter (Signed)
Transition Care Management Unsuccessful Follow-up Telephone Call  Date of discharge and from where:  12/01/2021-[place]   Attempts:  1st Attempt  Reason for unsuccessful TCM follow-up call:  Voice mail full

## 2021-12-04 NOTE — Telephone Encounter (Signed)
From the discharge call:  He said he is experiencing headaches every day and he is not sure if this is due to medications.  He explained that the headaches are in the back of his head at the " stem" of is neck. He said they tend to last all day and are gone by the end of the day. He did not report any vision changes, nausea, vomiting. He said that he informed the hospital staff about the headaches.Informed him that Dr Margarita Rana would be notified.  Discussed when to return to ED. Provided him information about the location of the Mobile Medical Unit tomorrow and he said he did not want to go there.  He would like a diagnoses for the headaches.  This CM explained that many times more testing would need to be done to provide an accurate diagnosis and he said he understood.    He is staying with a relative but will soon need to leave their residence He has been working with the [employer] and is moving up on the wait list for housing.    he said he has all medications and did not have any questions about his med regime   He has a nebulizer and said that he still needs a CPAP machine. He is now insured   Scheduled to see Dr Wynetta Emery 12/11/2021.  Dr Margarita Rana did not have any appointments available for him to be seen this soon and he wanted an in person appointment.

## 2021-12-04 NOTE — Telephone Encounter (Signed)
Transition Care Management Follow-up Telephone Call Date of discharge and from where: 12/01/2021, [facility] How have you been since you were released from the hospital? He said he is experiencing headaches every day and he is not sure if this is due to medications.  He explained that the headaches are in the back of his head at the " stem" of is neck. He said they tend to last all day and are gone by the end of the day. He did not report any vision changes, nausea, vomiting. He said that he informed the hospital staff about the headaches.Informed him that Dr Margarita Rana would be notified.  Discussed when to return to ED. Provided him information about the location of the Mobile Medical Unit tomorrow and he said he did not want to go there.  He would like a diagnoses for the headaches.  This CM explained that many times more testing would need to be done to provide an accurate diagnosis and he said he understood.  Any questions or concerns? Yes- noted above regarding headaches.  He is staying with a relative but will soon need to leave their residence He has been working with the [employer] and is moving up on the wait list for housing.   Items Reviewed: Did the pt receive and understand the discharge instructions provided? Yes  Medications obtained and verified? Yes - he said he has all medications and did not have any questions about his med regime  Other? No  Any new allergies since your discharge? No  Dietary orders reviewed? Yes Do you have support at home?  Currently staying with a relative.   Home Care and Equipment/Supplies: Were home health services ordered? no If so, what is the name of the agency? N/a  Has the agency set up a time to come to the patient's home? not applicable Were any new equipment or medical supplies ordered?  No What is the name of the medical supply agency? N/a Were you able to get the supplies/equipment? not applicable Do you have any  questions related to the use of the equipment or supplies? No  He has a nebulizer and said that he still needs a CPAP machine. He is now insured   Functional Questionnaire: (I = Independent and D = Dependent) ADLs: independent. He no longer has a cane.    Follow up appointments reviewed:  PCP Hospital f/u appt confirmed? Yes  Scheduled to see Dr Wynetta Emery 12/11/2021.  Dr Margarita Rana did not have any appointments available for him to be seen this soon and he wanted an in person appointment.    [facility] f/u appt confirmed? No ,none scheduled at this time   Are transportation arrangements needed?  Possibly - provided him with the phone number for  [employer] to call and arrange rides to medical appointments if he is not able to find someone to transport him  If their condition worsens, is the pt aware to call PCP or go to the Emergency Dept.? Yes Was the patient provided with contact information for the PCP's office or ED? Yes Was to pt encouraged to call back with questions or concerns? Yes

## 2021-12-07 ENCOUNTER — Ambulatory Visit: Payer: Self-pay | Admitting: *Deleted

## 2021-12-07 NOTE — Telephone Encounter (Signed)
°  Chief Complaint: headache Symptoms: headache Frequency: chronic since released from hospital. Patient feels his BP is not low enough with the medications he is taking and is causing him to have daily headache. Patient states he is very sensitive to BP- and is not within normal range- he has headache. Pertinent Negatives: Patient denies chest pain, vision changes, weakness Disposition: [] ED /[x] Urgent Care (no appt availability in office) / [] Appointment(In office/virtual)/ []  [place] [facility]/ [] Home Care/ [] Refused Recommended Disposition /[] [place] [facility]/ []  Follow-up with PCP Additional Notes: Advised mobile unit/UC- patient states he does not have transportation. He will try to get to UC. Patient is requesting a letter for school- he has missed school for recent hospitalization and headaches. Patient advised I would send request for provider review

## 2021-12-07 NOTE — Telephone Encounter (Signed)
Reason for Disposition  [1] MILD-MODERATE headache AND [2] present > 72 hours  Answer Assessment - Initial Assessment Questions 1. LOCATION: "Where does it hurt?"      Back of head 2. ONSET: "When did the headache start?" (Minutes, hours or days)      1/24- in hospital- since released from hospital 3. PATTERN: "Does the pain come and go, or has it been constant since it started?"     Comes and goes- decreased with BP- patient feels his BP is high- no missed medication  4. SEVERITY: "How bad is the pain?" and "What does it keep you from doing?"  (e.g., Scale 1-10; mild, moderate, or severe)   - MILD (1-3): doesn't interfere with normal activities    - MODERATE (4-7): interferes with normal activities or awakens from sleep    - SEVERE (8-10): excruciating pain, unable to do any normal activities        mild 5. RECURRENT SYMPTOM: "Have you ever had headaches before?" If Yes, ask: "When was the last time?" and "What happened that time?"      Yes- elevated BP 6. CAUSE: "What do you think is causing the headache?"     Elevated BP 7. MIGRAINE: "Have you been diagnosed with migraine headaches?" If Yes, ask: "Is this headache similar?"      no 8. HEAD INJURY: "Has there been any recent injury to the head?"      no 9. OTHER SYMPTOMS: "Do you have any other symptoms?" (fever, stiff neck, eye pain, sore throat, cold symptoms)     no 10. PREGNANCY: "Is there any chance you are pregnant?" "When was your last menstrual period?"       *No Answer*  Protocols used: Headache-A-AH

## 2021-12-08 ENCOUNTER — Other Ambulatory Visit: Payer: Self-pay | Admitting: *Deleted

## 2021-12-08 NOTE — Patient Outreach (Signed)
Care Coordination  12/08/2021  [name] 1961-04-04 [id number]   Medicaid Managed Care   Unsuccessful Outreach Note  12/08/2021 Name: Johnny [name] MRN: [id number] DOB: 30-Sep-1961  Referred by: Charlott Rakes, MD Reason for referral : High Risk Managed Medicaid (Unsuccessful RNCM initial telephone outreach)   An unsuccessful telephone outreach was attempted today. The patient was referred to the case management team for assistance with care management and care coordination.   Follow Up Plan: A HIPAA compliant phone message was left for the patient providing contact information and requesting a return call.   Lurena Joiner RN, BSN [place] RN Care Coordinator

## 2021-12-08 NOTE — Patient Instructions (Signed)
Visit Information  Mr. Johnny [name]  - as a part of your Medicaid benefit, you are eligible for care management and care coordination services at no cost or copay. I was unable to reach you by phone today but would be happy to help you with your health related needs. Please feel free to call me @ [phone].   A member of the Managed Medicaid care management team will reach out to you again over the next 14 days.   Lurena Joiner RN, BSN [place] RN Care Coordinator

## 2021-12-11 ENCOUNTER — Ambulatory Visit: Payer: Medicaid Other | Attending: Internal Medicine | Admitting: Internal Medicine

## 2021-12-11 DIAGNOSIS — D631 Anemia in chronic kidney disease: Secondary | ICD-10-CM

## 2021-12-11 DIAGNOSIS — M10062 Idiopathic gout, left knee: Secondary | ICD-10-CM

## 2021-12-11 DIAGNOSIS — Z09 Encounter for follow-up examination after completed treatment for conditions other than malignant neoplasm: Secondary | ICD-10-CM | POA: Diagnosis not present

## 2021-12-11 DIAGNOSIS — N185 Chronic kidney disease, stage 5: Secondary | ICD-10-CM

## 2021-12-11 DIAGNOSIS — I5032 Chronic diastolic (congestive) heart failure: Secondary | ICD-10-CM | POA: Diagnosis not present

## 2021-12-11 DIAGNOSIS — I10 Essential (primary) hypertension: Secondary | ICD-10-CM

## 2021-12-11 MED ORDER — LABETALOL HCL 300 MG PO TABS: 300.0000 mg | ORAL_TABLET | Freq: Two times a day (BID) | ORAL | 3 refills | Status: DC

## 2021-12-11 MED ORDER — PREDNISONE 20 MG PO TABS: ORAL_TABLET | ORAL | 0 refills | Status: DC

## 2021-12-11 NOTE — Progress Notes (Signed)
Patient ID: Johnny [name], male   DOB: 07-11-1961, 61 y.o.   MRN: [id number] Virtual Visit via Telephone Note  I connected with Johnny [name] on 12/11/2021 at 12:58 PM by telephone and verified that I am speaking with the correct person using two identifiers  Location: Patient: home Provider: office  Participants: Myself Patient CMA: Johnny [name] interpreter:   I discussed the limitations, risks, security and privacy concerns of performing an evaluation and management service by telephone and the availability of in person appointments. I also discussed with the patient that there may be a patient responsible charge related to this service. The patient expressed understanding and agreed to proceed.   History of Present Illness: Patient with history of CKD stage IV, COPD, HTN, diastolic dysfunction, OSA, gout, tobacco dependence.  PCP is Dr. Margarita [name] Today's visit is transition of care. Date of hospitalization: 1/24-27/2023 Phone call from caseworker, 12/04/2021  Patient hospitalized with shortness of breath, found to be in acute on chronic diastolic heart failure with AKI on top of CKD 4 and anemia of chronic disease.  Patient decided against starting on hemodialysis at that time.  Patient diuresed.  Plan for follow-up with nephrology in the outpatient.  Blood pressure was better on labetalol 200 mg 3 times a day, amlodipine 10 mg daily and Imdur 30 mg added.  He received IV iron.  He was continued on Dulera inhaler and given nebulizer treatments.  Today: Patient reports that he is no longer short of breath.  He still has some lower extremity edema but much better compared to when he went to the hospital.  He complains of headaches since being placed on Imdur.  He went without taking the Imdur this past weekend and found that the headaches went away.  He is checking his blood pressure a few times a day.  He gives a range of 140s/80s.  BP this a.m was 135/87 after taking his medications  except the Imdur.  He tries to limit salt in the foods.  He does not have a scale at home.  CKD 5: GFR during hospitalization was at 10.  He has a follow-up appointment this Thursday with Johnny [name].  Anemia: He was given IV iron in the hospital.  He denies any blood in the stools or black stools.  He had colonoscopy in 2020.  Iron studies look to be a combination of anemia of chronic disease +/- iron def  He feels he is having a flare of gout in his left knee.  He has pain and swelling in the knee.  He takes allopurinol but inconsistently.  Colchicine is on his list but patient states he has not been taking it.  Outpatient Encounter Medications as of 12/11/2021  Medication Sig   albuterol (VENTOLIN HFA) 108 (90 Base) MCG/ACT inhaler Inhale 2 puffs into the lungs every 6 (six) hours as needed for wheezing or shortness of breath.   allopurinol (ZYLOPRIM) 100 MG tablet Take 2 tablets (200 mg total) by mouth daily. (Patient taking differently: Take 100 mg by mouth daily.)   amLODipine (NORVASC) 10 MG tablet Take 1 tablet by mouth once daily (Patient taking differently: Take 10 mg by mouth at bedtime.)   atorvastatin (LIPITOR) 40 MG tablet Take 1 tablet (40 mg total) by mouth daily.   Blood Pressure Monitoring (3 SERIES BP MONITOR/UPPER ARM) DEVI 1 Units by Does not apply route 2 (two) times daily.   clotrimazole-betamethasone (LOTRISONE) cream Apply to affected area 2 times daily.  Colchicine (MITIGARE) 0.6 MG CAPS Take 2 capsules (1.2 mg) orally at the onset of a gout flare then repeat 1 capsule (0.6 mg) in 1 hour if pain continues. (Patient taking differently: Take 1.2 mg by mouth as needed (gout flares).)   furosemide (LASIX) 40 MG tablet Take 1 tablet (40 mg total) by mouth 2 (two) times daily.   ipratropium-albuterol (DUONEB) 0.5-2.5 (3) MG/3ML SOLN USE 1 AMPULE IN NEBULIZER EVERY 6 HOURS AS NEEDED (Patient taking differently: Take 3 mLs by nebulization every 6 (six) hours as needed.)   isosorbide  mononitrate (IMDUR) 30 MG 24 hr tablet Take 1 tablet (30 mg total) by mouth daily.   levothyroxine (SYNTHROID) 100 MCG tablet Take 1 tablet (100 mcg total) by mouth daily.   Misc. Devices MISC Blood Pressure monitor. Dx: HTN   mometasone-formoterol (DULERA) 100-5 MCG/ACT AERO Inhale 2 puffs into the lungs 2 (two) times daily.   triamcinolone (KENALOG) 0.1 % Apply 1 application topically 2 (two) times daily.   [DISCONTINUED] labetalol (NORMODYNE) 200 MG tablet TAKE 1 TABLET BY MOUTH THREE TIMES DAILY (Patient taking differently: Take 200 mg by mouth 3 (three) times daily.)   No facility-administered encounter medications on file as of 12/11/2021.       Observations/Objective: No direct observation done as this was a telephone visit.  Assessment and Plan: 1. Hospital discharge follow-up   2. Chronic diastolic congestive heart failure (HCC) -Stable at this time. Stop Imdur since it is causing headache for him.  Continue amlodipine 10 mg daily.  Change labetalol to 300 mg twice a day.  Keep appointment with nephrologist later this week where I am sure his blood pressure will be rechecked.. - Ambulatory referral to Cardiology  3. Chronic kidney disease (CKD), stage V ([place]) Keep upcoming appointment with Johnny [name] later this week.  4. Acute idiopathic gout of left knee Hold off on taking colchicine.  We will give him prednisone instead. - predniSONE (DELTASONE) 20 MG tablet; 1 tab PO daily x 4 days then 1/2 tab PO daily x 5 days  Dispense: 7 tablet; Refill: 0  5. Anemia due to stage 5 chronic kidney disease, not on chronic dialysis [facility]) Given iron in the hospital.  I think his nephrologist would probably recheck his blood count on his upcoming visit.  6. Essential hypertension See #2 above. - labetalol (NORMODYNE) 300 MG tablet; Take 1 tablet (300 mg total) by mouth 2 (two) times daily.  Dispense: 60 tablet; Refill: 3   Follow Up Instructions: 6 wks with Dr. Margarita [name]    I discussed  the assessment and treatment plan with the patient. The patient was provided an opportunity to ask questions and all were answered. The patient agreed with the plan and demonstrated an understanding of the instructions.   The patient was advised to call back or seek an in-person evaluation if the symptoms worsen or if the condition fails to improve as anticipated.  I  Spent 17 minutes on this telephone encounter  This note has been created with Surveyor, quantity. Any transcriptional errors are unintentional.  Karle Plumber, MD

## 2021-12-12 ENCOUNTER — Telehealth: Payer: Self-pay | Admitting: Family Medicine

## 2021-12-12 NOTE — Telephone Encounter (Signed)
.. °  Medicaid Managed Care   Unsuccessful Outreach Note  12/12/2021 Name: Johnny [name] MRN: [id number] DOB: [birth year]  Referred by: Charlott Rakes, MD Reason for referral : High Risk Managed Medicaid (I called the patient today to reschedule his Initial Visit with the MM RNCM. I was unable to leave a message. HIs VM was full.)   An unsuccessful telephone outreach was attempted today. The patient was referred to the case management team for assistance with care management and care coordination.   Follow Up Plan: The care management team will reach out to the patient again over the next 7 days.    [place]

## 2021-12-14 ENCOUNTER — Telehealth: Payer: Self-pay

## 2021-12-14 ENCOUNTER — Encounter: Payer: Self-pay | Admitting: Family Medicine

## 2021-12-14 NOTE — Telephone Encounter (Signed)
Copied from [place] [id number]. Topic: General - Other >> Dec 13, 2021 11:27 AM McGill, Nelva Bush wrote: Pt would like to clarify the dates for the doctors' note, which are from January 25 to February 14. Stated this is due to gout flare up discussed at his appt.   Please advise.

## 2021-12-15 NOTE — Telephone Encounter (Signed)
He had no appointment with me. He sent me a my chart message and I wrote the note according to his request.

## 2021-12-15 NOTE — Telephone Encounter (Signed)
Noted PCP has responded to patient via mychart.

## 2021-12-21 ENCOUNTER — Inpatient Hospital Stay: Payer: Medicaid Other | Admitting: Physician Assistant

## 2021-12-27 ENCOUNTER — Other Ambulatory Visit: Payer: Self-pay | Admitting: Physician Assistant

## 2021-12-27 DIAGNOSIS — I10 Essential (primary) hypertension: Secondary | ICD-10-CM

## 2021-12-27 NOTE — Telephone Encounter (Signed)
Requested Prescriptions  Pending Prescriptions Disp Refills   amLODipine (NORVASC) 10 MG tablet [Pharmacy Med Name: amLODIPine Besylate 10 MG Oral Tablet] 30 tablet 2    Sig: Take 1 tablet by mouth once daily     Cardiovascular: Calcium Channel Blockers 2 Failed - 12/27/2021 12:01 PM      Failed - Last BP in normal range    BP Readings from Last 1 Encounters:  12/01/21 (!) 140/100         Passed - Last Heart Rate in normal range    Pulse Readings from Last 1 Encounters:  12/01/21 79         Passed - Valid encounter within last 6 months    Recent Outpatient Visits          2 weeks ago Hospital discharge follow-up   [name], [place]   6 months ago    [name], [name]   11 months ago Strep pharyngitis   [name], [name], [name]   11 months ago Chronic kidney disease (CKD), stage V [facility])   [name], MD   1 year ago Difficulty concentrating   [name], [name]      Future Appointments            In 1 week [name], MD [place], [facility]

## 2022-01-01 ENCOUNTER — Other Ambulatory Visit: Payer: Self-pay

## 2022-01-01 DIAGNOSIS — M1A00X0 Idiopathic chronic gout, unspecified site, without tophus (tophi): Secondary | ICD-10-CM

## 2022-01-01 NOTE — Telephone Encounter (Signed)
Copied from [place] [id number]. Topic: General - Other >> Dec 27, 2021  4:38 PM Pawlus, Brayton Layman A wrote: Reason for CRM: Pt called in regarding gout medication, pt did not know the name of the medication but stated he needs it sent into the [place], please advise.    Pt is needing refills on gout medications.

## 2022-01-02 ENCOUNTER — Other Ambulatory Visit: Payer: Self-pay | Admitting: Internal Medicine

## 2022-01-02 DIAGNOSIS — M10062 Idiopathic gout, left knee: Secondary | ICD-10-CM

## 2022-01-02 MED ORDER — ALLOPURINOL 100 MG PO TABS: 100.0000 mg | ORAL_TABLET | Freq: Every day | ORAL | 3 refills | Status: DC

## 2022-01-02 MED ORDER — COLCHICINE 0.6 MG PO CAPS: ORAL_CAPSULE | ORAL | 1 refills | Status: DC

## 2022-01-04 ENCOUNTER — Ambulatory Visit: Payer: Medicaid Other | Admitting: Cardiovascular Disease

## 2022-01-04 NOTE — Progress Notes (Deleted)
? ?No chief complaint on file. ? ? ?  ?History of Present Illness: 61 yo male with history of COPD, chronic kidney disease stage IV, HTN, hypothyroidism, chronic diastolic CHF and sleep apnea here today as a new patient for the evaluation of his diastolic CHF. He was recently admitted to [facility] with respiratory distress and found to be volume overloaded with worsened renal function. Creatinine up to 6.0. He was followed closely by Nephrology and discharged without immediate plans to start dialysis. Echo 11/29/21 with LVEF=60-65%, mild mitral regurgitation. He was discharged home on Lasix 40 mg BID.  ? ?He tells me today that he*** ? ?Primary Care Physician: Charlott Rakes, MD ? ? ?Past Medical History:  ?Diagnosis Date  ? Adenomatous colon polyp   ? tubular  ? Chronic kidney disease   ? COPD (chronic obstructive pulmonary disease) ([place])   ? Diverticulosis   ? External otitis of right ear 11/10/2018  ? Hepatitis C   ? Hypertension   ? OSA (obstructive sleep apnea) 11/03/2015  ? Strep throat 01/2021  ? ? ?Past Surgical History:  ?Procedure Laterality Date  ? AV FISTULA PLACEMENT  09/12/2009  ? Left arm AVF  ? ? ?Current Outpatient Medications  ?Medication Sig Dispense Refill  ? albuterol (VENTOLIN HFA) 108 (90 Base) MCG/ACT inhaler Inhale 2 puffs into the lungs every 6 (six) hours as needed for wheezing or shortness of breath. 18 g 2  ? allopurinol (ZYLOPRIM) 100 MG tablet Take 1 tablet (100 mg total) by mouth daily. 30 tablet 3  ? amLODipine (NORVASC) 10 MG tablet Take 1 tablet by mouth once daily 30 tablet 2  ? atorvastatin (LIPITOR) 40 MG tablet Take 1 tablet (40 mg total) by mouth daily. 30 tablet 2  ? Blood Pressure Monitoring (3 SERIES BP MONITOR/UPPER ARM) DEVI 1 Units by Does not apply route 2 (two) times daily. 1 each 0  ? clotrimazole-betamethasone (LOTRISONE) cream Apply to affected area 2 times daily. 30 g 0  ? Colchicine (MITIGARE) 0.6 MG CAPS Take 2 capsules (1.2 mg) orally at the onset of a gout flare then  repeat 1 capsule (0.6 mg) in 1 hour if pain continues. 90 capsule 1  ? furosemide (LASIX) 40 MG tablet Take 1 tablet (40 mg total) by mouth 2 (two) times daily. 60 tablet 0  ? ipratropium-albuterol (DUONEB) 0.5-2.5 (3) MG/3ML SOLN USE 1 AMPULE IN NEBULIZER EVERY 6 HOURS AS NEEDED (Patient taking differently: Take 3 mLs by nebulization every 6 (six) hours as needed.) 90 mL 0  ? isosorbide mononitrate (IMDUR) 30 MG 24 hr tablet Take 1 tablet (30 mg total) by mouth daily. 30 tablet 0  ? labetalol (NORMODYNE) 300 MG tablet Take 1 tablet (300 mg total) by mouth 2 (two) times daily. 60 tablet 3  ? levothyroxine (SYNTHROID) 100 MCG tablet Take 1 tablet (100 mcg total) by mouth daily. 30 tablet 0  ? Misc. Devices MISC Blood Pressure monitor. Dx: HTN 1 each 0  ? mometasone-formoterol (DULERA) 100-5 MCG/ACT AERO Inhale 2 puffs into the lungs 2 (two) times daily. 13 g 6  ? predniSONE (DELTASONE) 20 MG tablet Take 1 tablet (20 mg total) by mouth daily with breakfast. 5 tablet 0  ? triamcinolone (KENALOG) 0.1 % Apply 1 application topically 2 (two) times daily. 45 g 1  ? ?No current facility-administered medications for this visit.  ? ? ?Allergies  ?Allergen Reactions  ? Naproxen Other (See Comments)  ?  Unknown  ? ? ?Social History  ? ?Socioeconomic  History  ? Marital status: Divorced  ?  Spouse name: Not on file  ? Number of children: Not on file  ? Years of education: 31  ? Highest education level: Not on file  ?Occupational History  ? Occupation: Electronics engineer  ?Tobacco Use  ? Smoking status: Former  ?  Packs/day: 0.10  ?  Types: Cigarettes  ?  Quit date: 04/02/2011  ?  Years since quitting: 10.7  ? Smokeless tobacco: Never  ? Tobacco comments:  ?  trying to quit  ?Vaping Use  ? Vaping Use: Never used  ?Substance and Sexual Activity  ? Alcohol use: Never  ?  Alcohol/week: 0.0 standard drinks  ? Drug use: No  ? Sexual activity: Not Currently  ?Other Topics Concern  ? Not on file  ?Social History Narrative  ? Lives at home  alone.  ? Right-handed.   ? Occasional caffeine use.  ? ?Social Determinants of Health  ? ?Financial Resource Strain: Not on file  ?Food Insecurity: Not on file  ?Transportation Needs: Not on file  ?Physical Activity: Not on file  ?Stress: Not on file  ?Social Connections: Not on file  ?Intimate Partner Violence: Not on file  ? ? ?Family History  ?Problem Relation Age of Onset  ? Hypertension Mother   ? Diabetes Mother   ? Colon cancer Neg Hx   ? ? ?Review of Systems:  As stated in the HPI and otherwise negative.  ? ?There were no vitals taken for this visit. ? ?Physical Examination: ?General: Well developed, well nourished, NAD  ?HEENT: OP clear, mucus membranes moist  ?SKIN: warm, dry. No rashes. ?Neuro: No focal deficits  ?Musculoskeletal: Muscle strength 5/5 all ext  ?Psychiatric: Mood and affect normal  ?Neck: No JVD, no carotid bruits, no thyromegaly, no lymphadenopathy.  ?Lungs:Clear bilaterally, no wheezes, rhonci, crackles ?Cardiovascular: Regular rate and rhythm. No murmurs, gallops or rubs. ?Abdomen:Soft. Bowel sounds present. Non-tender.  ?Extremities: No lower extremity edema. Pulses are 2 + in the bilateral DP/PT. ? ?EKG:  EKG {ACTION; IS/IS BMW:[id number]} ordered today. ?The ekg ordered today demonstrates *** ? ?Recent Labs: ?11/28/2021: B Natriuretic Peptide 1,296.1; Platelets 214 ?11/29/2021: ALT 19; Hemoglobin 9.9; Magnesium 1.9 ?11/30/2021: BUN 60; Creatinine, Ser 6.02; Potassium 4.4; Sodium 135  ? ?Lipid Panel ?   ?Component Value Date/Time  ? CHOL 214 (H) 11/10/2018 1104  ? TRIG 80 11/10/2018 1104  ? HDL 96 11/10/2018 1104  ? CHOLHDL 2.2 11/10/2018 1104  ? CHOLHDL 2.3 12/07/2016 1006  ? VLDL 16 02/22/2009 0635  ? LDLCALC 102 (H) 11/10/2018 1104  ? ?  ?Wt Readings from Last 3 Encounters:  ?12/01/21 201 lb 1.6 oz (91.2 kg)  ?11/28/21 190 lb (86.2 kg)  ?02/09/21 211 lb (95.7 kg)  ?  ? ?Other studies Reviewed: ?Additional studies/ records that were reviewed today include: ***. ?Review of the above  records demonstrates: *** ? ? ?Assessment and Plan:  ? ?1.  ? ?Current medicines are reviewed at length with the patient today.  The patient {ACTIONS; HAS/DOES NOT HAVE:19233} concerns regarding medicines. ? ?The following changes have been made:  {PLAN; NO CHANGE:13088:s} ? ?Labs/ tests ordered today include: *** ?No orders of the defined types were placed in this encounter. ? ? ? ?Disposition:   F/U with *** in {gen number [id number]} {TIME; UNITS DAY/WEEK/MONTH:19136} ? ? ?Signed, ?Lauree Chandler, MD ?01/04/2022 6:30 AM    ?[facility] ?[place], [place], [place]  [address] ?Phone: [phone]; Fax: [phone]  ? ? ?

## 2022-01-21 ENCOUNTER — Other Ambulatory Visit: Payer: Self-pay | Admitting: Family Medicine

## 2022-01-21 DIAGNOSIS — M10062 Idiopathic gout, left knee: Secondary | ICD-10-CM

## 2022-01-22 ENCOUNTER — Inpatient Hospital Stay: Payer: Medicaid Other | Admitting: Family Medicine

## 2022-01-23 NOTE — Telephone Encounter (Signed)
Requested medication (s) are due for refill today: Unclear if to be continued ? ?Requested medication (s) are on the active medication list: yes ? ?Last refill:  01/03/22 #50 with 0 RF ? ?Future visit scheduled: no ? ?Notes to clinic:  This medication can not be delegated, please assess.  ? ? ? ?  ? ?Requested Prescriptions  ?Pending Prescriptions Disp Refills  ? predniSONE (DELTASONE) 20 MG tablet [Pharmacy Med Name: predniSONE 20 MG Oral Tablet] 5 tablet 0  ?  Sig: Take 1 tablet by mouth once daily with breakfast  ?  ? Not Delegated - Endocrinology:  Oral Corticosteroids Failed - 01/21/2022  2:34 PM  ?  ?  Failed - This refill cannot be delegated  ?  ?  Failed - Manual Review: Eye exam for IOP if prolonged treatment  ?  ?  Failed - Glucose (serum) in normal range and within 180 days  ?  Glucose, Bld  ?Date Value Ref Range Status  ?11/30/2021 113 (H) 70 - 99 mg/dL Final  ?  Comment:  ?  Glucose reference range applies only to samples taken after fasting for at least 8 hours.  ?  ?  ?  ?  Failed - Last BP in normal range  ?  BP Readings from Last 1 Encounters:  ?12/01/21 (!) 140/100  ?  ?  ?  ?  Failed - Bone Mineral Density or Dexa Scan completed in the last 2 years  ?  ?  Passed - K in normal range and within 180 days  ?  Potassium  ?Date Value Ref Range Status  ?11/30/2021 4.4 3.5 - 5.1 mmol/L Final  ?  ?  ?  ?  Passed - Na in normal range and within 180 days  ?  Sodium  ?Date Value Ref Range Status  ?11/30/2021 135 135 - 145 mmol/L Final  ?11/08/2020 135 134 - 144 mmol/L Final  ?  ?  ?  ?  Passed - Valid encounter within last 6 months  ?  Recent Outpatient Visits   ? ?      ? 1 month ago Hospital discharge follow-up  ? [place] Ladell Pier, MD  ? 7 months ago   ? [place], [place], [name]  ? 12 months ago Strep pharyngitis  ? [place], [place], [name]  ? 1 year ago Chronic kidney disease (CKD),  stage V ([place])  ? [place] Charlott Rakes, MD  ? 1 year ago Difficulty concentrating  ? [place], [place], [name]  ? ?  ?  ? ?  ?  ?  ? ? ?

## 2022-01-26 ENCOUNTER — Ambulatory Visit: Payer: Medicaid Other | Admitting: Cardiology

## 2022-02-20 ENCOUNTER — Other Ambulatory Visit: Payer: Self-pay | Admitting: Family Medicine

## 2022-02-20 DIAGNOSIS — M10062 Idiopathic gout, left knee: Secondary | ICD-10-CM

## 2022-02-21 ENCOUNTER — Encounter: Payer: Self-pay | Admitting: *Deleted

## 2022-02-21 ENCOUNTER — Ambulatory Visit: Payer: Self-pay | Admitting: *Deleted

## 2022-02-21 NOTE — Telephone Encounter (Addendum)
Summary: ankle discomfort / rx request  ? The patient is currently having a gout flare up  ? ?The patient shares that their right ankle is giving them significant discomfort  ? ?The patient has ran out of their medication and would like to be prescribed more to help with their discomfort  ? ?Please contact further   ?  ?Mailbox full, unable to leave message. Ptactive on MyChart, will message him. ?Second call, no answer, voicemail full. ? ? ?

## 2022-02-21 NOTE — Telephone Encounter (Signed)
?  Chief Complaint: gout ?Symptoms: pain ?Frequency: constant ?Pertinent Negatives: Patient denies fever ?Disposition: '[]'$ ED /'[]'$ Urgent Care (no appt availability in office) / '[x]'$ Appointment(In office/virtual)/ '[]'$  [place] Virtual Care/ '[]'$ Home Care/ '[]'$ Refused Recommended Disposition /'[]'$ [place] Mobile Bus/ '[]'$  Follow-up with PCP ?Additional Notes: Pt does not want an appt, was seen and gout addressed 12/11/21. Has refill on Allopurinol and Colchicine which he says he does not take Colchicine any longer. Pt is asking for another round of prednisone to be called in. ? ?Reason for Disposition ? [1] MODERATE pain (e.g., interferes with normal activities, limping) AND [2] present > 3 days ? ?Answer Assessment - Initial Assessment Questions ?1. ONSET: "When did the pain start?"  ?    Started yesterday ?2. LOCATION: "Where is the pain located?"  ?    Right ankle and bottom of foot ?3. PAIN: "How bad is the pain?"    (Scale 1-10; or mild, moderate, severe) ? - MILD (1-3): doesn't interfere with normal activities.  ? - MODERATE (4-7): interferes with normal activities (e.g., work or school) or awakens from sleep, limping.  ? - SEVERE (8-10): excruciating pain, unable to do any normal activities, unable to walk.  ?    Pretty bad ?4. WORK OR EXERCISE: "Has there been any recent work or exercise that involved this part of the body?"  ?    No, it is gout per pt ?5. CAUSE: "What do you think is causing the foot pain?" ?    gout ?6. OTHER SYMPTOMS: "Do you have any other symptoms?" (e.g., leg pain, rash, fever, numbness) ?    no ?7. PREGNANCY: "Is there any chance you are pregnant?" "When was your last menstrual period?" ?    no ? ?Protocols used: Foot Pain-A-AH ? ?

## 2022-02-22 ENCOUNTER — Ambulatory Visit: Payer: Self-pay

## 2022-02-22 DIAGNOSIS — M10062 Idiopathic gout, left knee: Secondary | ICD-10-CM

## 2022-02-22 MED ORDER — PREDNISONE 20 MG PO TABS: 40.0000 mg | ORAL_TABLET | Freq: Every day | ORAL | 0 refills | Status: DC

## 2022-02-22 NOTE — Telephone Encounter (Signed)
Patient aware of medication increase and letter.  ?He states he know how to access the letter on MyChart.  ?

## 2022-02-22 NOTE — Telephone Encounter (Signed)
Patient called and would like to speak with a nurse about his medication prednisone. He would like to know if he can take more then what is prescribed because his flare is bad and so is the pain. Pt had to miss school due to this and would like to know if he can get a school note for 4/12, 4/19, 4/20 due to illness.  ? ? ?Taking medications as prescribed. Asking for school note for the above dates. Send through his My Chart ? ?Answer Assessment - Initial Assessment Questions ?1. REASON FOR CALL or QUESTION: "What is your reason for calling today?" or "How can I best ?help you?" or "What question do you have that I can help answer?" ?    Pt. Needs a note for missing school due to gout flare up. Has missed 4/12,4/19/4/20. Send in his My Chart. ?2. CALLER: Document the source of call. (e.g., laboratory, patient). ?    Patient ? ?Protocols used: PCP Call - No Triage-A-AH ? ?

## 2022-02-22 NOTE — Telephone Encounter (Signed)
I have sent a prescription for increased dose of prednisone 40 mg daily for 5 days to his pharmacy.  Letter has been sent to him via MyChart. ?

## 2022-03-04 ENCOUNTER — Other Ambulatory Visit: Payer: Self-pay | Admitting: Family Medicine

## 2022-03-05 ENCOUNTER — Other Ambulatory Visit: Payer: Self-pay | Admitting: Family Medicine

## 2022-03-05 NOTE — Telephone Encounter (Signed)
Medication Refill - Medication: furosemide (LASIX) 40 MG tablet  ? ?Has the patient contacted their pharmacy? Yes.   Pt told to contact provider ? ?Preferred Pharmacy (with phone number or street name):  ?[facility] ([place]), [place] - [address] Phone:  [phone]  ?Fax:  [phone]  ?  ? ?Has the patient been seen for an appointment in the last year OR does the patient have an upcoming appointment? Yes.   ? ?Agent: Please be advised that RX refills may take up to 3 business days. We ask that you follow-up with your pharmacy.  ?

## 2022-03-06 MED ORDER — FUROSEMIDE 40 MG PO TABS
40.0000 mg | ORAL_TABLET | Freq: Two times a day (BID) | ORAL | 0 refills | Status: DC
Start: 2022-03-06 — End: 2022-03-30

## 2022-03-06 NOTE — Telephone Encounter (Signed)
Requested Prescriptions  ?Pending Prescriptions Disp Refills  ?? furosemide (LASIX) 40 MG tablet 60 tablet 0  ?  Sig: Take 1 tablet (40 mg total) by mouth 2 (two) times daily.  ?  ? Cardiovascular:  Diuretics - Loop Failed - 03/05/2022  5:14 PM  ?  ?  Failed - Ca in normal range and within 180 days  ?  Calcium  ?Date Value Ref Range Status  ?11/30/2021 7.9 (L) 8.9 - 10.3 mg/dL Final  ? ?Calcium, Ion  ?Date Value Ref Range Status  ?11/29/2021 1.02 (L) 1.15 - 1.40 mmol/L Final  ?   ?  ?  Failed - Cr in normal range and within 180 days  ?  Creat  ?Date Value Ref Range Status  ?12/07/2016 2.78 (H) 0.70 - 1.33 mg/dL Final  ?  Comment:  ?    ?For patients > or = 59 years of age: The upper reference limit for ?Creatinine is approximately 13% higher for people identified as ?African-American. ?  ?  ? ?Creatinine, Ser  ?Date Value Ref Range Status  ?11/30/2021 6.02 (H) 0.61 - 1.24 mg/dL Final  ? ?Creatinine, Urine  ?Date Value Ref Range Status  ?02/22/2009 225.5 mg/dL Final  ?   ?  ?  Failed - Last BP in normal range  ?  BP Readings from Last 1 Encounters:  ?12/01/21 (!) 140/100  ?   ?  ?  Passed - K in normal range and within 180 days  ?  Potassium  ?Date Value Ref Range Status  ?11/30/2021 4.4 3.5 - 5.1 mmol/L Final  ?   ?  ?  Passed - Na in normal range and within 180 days  ?  Sodium  ?Date Value Ref Range Status  ?11/30/2021 135 135 - 145 mmol/L Final  ?11/08/2020 135 134 - 144 mmol/L Final  ?   ?  ?  Passed - Cl in normal range and within 180 days  ?  Chloride  ?Date Value Ref Range Status  ?11/30/2021 105 98 - 111 mmol/L Final  ?   ?  ?  Passed - Mg Level in normal range and within 180 days  ?  Magnesium  ?Date Value Ref Range Status  ?11/29/2021 1.9 1.7 - 2.4 mg/dL Final  ?  Comment:  ?  Performed at [facility] [address], [place], [place] [address]  ?   ?  ?  Passed - Valid encounter within last 6 months  ?  Recent Outpatient Visits   ?      ? 2 months ago Hospital discharge follow-up  ? [place] Ladell Pier, MD  ? 8 months ago   ? [place], [place], [name]  ? 1 year ago Strep pharyngitis  ? [place], [place], [name]  ? 1 year ago Chronic kidney disease (CKD), stage V ([place])  ? [place] Charlott Rakes, MD  ? 1 year ago Difficulty concentrating  ? [place], [place], [name]  ?  ?  ?Future Appointments   ?        ? In 3 weeks Thereasa Solo, [place]  ?  ? ?  ?  ?  ? ? ?

## 2022-03-08 ENCOUNTER — Other Ambulatory Visit: Payer: Self-pay | Admitting: Internal Medicine

## 2022-03-08 ENCOUNTER — Other Ambulatory Visit: Payer: Self-pay | Admitting: Family Medicine

## 2022-03-08 DIAGNOSIS — I10 Essential (primary) hypertension: Secondary | ICD-10-CM

## 2022-03-08 DIAGNOSIS — M10062 Idiopathic gout, left knee: Secondary | ICD-10-CM

## 2022-03-08 NOTE — Telephone Encounter (Signed)
Requested medication (s) are due for refill today:   Provider to review ? ?Requested medication (s) are on the active medication list:   Yes ? ?Future visit scheduled:   Yes ? ? ?Last ordered: 02/22/2022 #10, 0 refills ? ?Non delegated refill  ? ?Requested Prescriptions  ?Pending Prescriptions Disp Refills  ? predniSONE (DELTASONE) 20 MG tablet [Pharmacy Med Name: predniSONE 20 MG Oral Tablet] 7 tablet 0  ?  Sig: TAKE 1 TABLET BY MOUTH ONCE DAILY FOR 4 DAYS THEN  ONE-HALF  TABLET  DAILY  FOR  5  DAYS  ?  ? Not Delegated - Endocrinology:  Oral Corticosteroids Failed - 03/08/2022  3:29 PM  ?  ?  Failed - This refill cannot be delegated  ?  ?  Failed - Manual Review: Eye exam for IOP if prolonged treatment  ?  ?  Failed - Glucose (serum) in normal range and within 180 days  ?  Glucose, Bld  ?Date Value Ref Range Status  ?11/30/2021 113 (H) 70 - 99 mg/dL Final  ?  Comment:  ?  Glucose reference range applies only to samples taken after fasting for at least 8 hours.  ?  ?  ?  ?  Failed - Last BP in normal range  ?  BP Readings from Last 1 Encounters:  ?12/01/21 (!) 140/100  ?  ?  ?  ?  Failed - Bone Mineral Density or Dexa Scan completed in the last 2 years  ?  ?  Passed - K in normal range and within 180 days  ?  Potassium  ?Date Value Ref Range Status  ?11/30/2021 4.4 3.5 - 5.1 mmol/L Final  ?  ?  ?  ?  Passed - Na in normal range and within 180 days  ?  Sodium  ?Date Value Ref Range Status  ?11/30/2021 135 135 - 145 mmol/L Final  ?11/08/2020 135 134 - 144 mmol/L Final  ?  ?  ?  ?  Passed - Valid encounter within last 6 months  ?  Recent Outpatient Visits   ? ?      ? 2 months ago Hospital discharge follow-up  ? [place] Ladell Pier, MD  ? 8 months ago   ? [place], [place], [name]  ? 1 year ago Strep pharyngitis  ? [place], [place], [name]  ? 1 year ago Chronic kidney disease (CKD), stage V ([place])  ?  [place] Charlott Rakes, MD  ? 1 year ago Difficulty concentrating  ? [place], [place], [name]  ? ?  ?  ?Future Appointments   ? ?        ? In 2 weeks Thereasa Solo, [place]  ? ?  ? ? ?  ?  ?  ? ?

## 2022-03-08 NOTE — Telephone Encounter (Signed)
Requested Prescriptions  ?Pending Prescriptions Disp Refills  ?? amLODipine (NORVASC) 10 MG tablet [Pharmacy Med Name: amLODIPine Besylate 10 MG Oral Tablet] 30 tablet 0  ?  Sig: Take 1 tablet by mouth once daily  ?  ? Cardiovascular: Calcium Channel Blockers 2 Failed - 03/08/2022  3:29 PM  ?  ?  Failed - Last BP in normal range  ?  BP Readings from Last 1 Encounters:  ?12/01/21 (!) 140/100  ?   ?  ?  Passed - Last Heart Rate in normal range  ?  Pulse Readings from Last 1 Encounters:  ?12/01/21 79  ?   ?  ?  Passed - Valid encounter within last 6 months  ?  Recent Outpatient Visits   ?      ? 2 months ago Hospital discharge follow-up  ? [place] Ladell Pier, MD  ? 8 months ago   ? [place], [place], [name]  ? 1 year ago Strep pharyngitis  ? [place], [place], [name]  ? 1 year ago Chronic kidney disease (CKD), stage V ([place])  ? [place] Charlott Rakes, MD  ? 1 year ago Difficulty concentrating  ? [place], [place], [name]  ?  ?  ?Future Appointments   ?        ? In 2 weeks Thereasa Solo, [place]  ?  ? ?  ?  ?  ? ?

## 2022-03-12 ENCOUNTER — Ambulatory Visit: Payer: Self-pay

## 2022-03-12 ENCOUNTER — Telehealth: Payer: Self-pay

## 2022-03-12 DIAGNOSIS — M10062 Idiopathic gout, left knee: Secondary | ICD-10-CM

## 2022-03-12 NOTE — Telephone Encounter (Signed)
Pt called once again for an update on the gout medication. Pt stated he is unable to get anything done because he can not walk due to the amount of pain in his foot. Pt requests return call asap  ?

## 2022-03-12 NOTE — Telephone Encounter (Signed)
Patient is requesting Prednisone medication for gout flare. ?

## 2022-03-12 NOTE — Telephone Encounter (Signed)
Pt stated he has a gout flare up, pt stated he has contacted the pharmacy a few times, pt would like an update on his medication refill request. ?

## 2022-03-12 NOTE — Telephone Encounter (Signed)
?  Chief Complaint: gout ?Symptoms: L ankle pain 8/10, swelling ?Frequency: since last week  ?Pertinent Negatives: Patient unable to bear weight on foot  ?Disposition: '[]'$ ED /'[]'$ Urgent Care (no appt availability in office) / '[]'$ Appointment(In office/virtual)/ '[]'$  [place] Virtual Care/ '[]'$ Home Care/ '[]'$ Refused Recommended Disposition /'[]'$ [place] Mobile Bus/ '[x]'$  Follow-up with PCP ?Additional Notes: pt was calling back to check on the prednisone he requested for refill. I advised pt that it had been sent to practice on 03/08/22 and I would have office RN f/up with him d/t this is a medication that is non delegated. Pt verbalized understanding.  ? ?Reason for Disposition ? [1] SEVERE pain (e.g., excruciating, unable to do any normal activities) AND [2] not improved after 2 hours of pain medicine ? ?Answer Assessment - Initial Assessment Questions ?1. ONSET: "When did the pain start?"  ?    Last week  ?2. LOCATION: "Where is the pain located?"  ?    L ankle  ?3. PAIN: "How bad is the pain?"    (Scale 1-10; or mild, moderate, severe) ? - MILD (1-3): doesn't interfere with normal activities.  ? - MODERATE (4-7): interferes with normal activities (e.g., work or school) or awakens from sleep, limping.  ? - SEVERE (8-10): excruciating pain, unable to do any normal activities, unable to walk.  ?    8 ?5. CAUSE: "What do you think is causing the foot pain?" ?    gout ?6. OTHER SYMPTOMS: "Do you have any other symptoms?" (e.g., leg pain, rash, fever, numbness) ?    swelling ? ?Protocols used: Foot Pain-A-AH ? ?

## 2022-03-13 ENCOUNTER — Telehealth (HOSPITAL_BASED_OUTPATIENT_CLINIC_OR_DEPARTMENT_OTHER): Payer: Medicaid Other | Admitting: Nurse Practitioner

## 2022-03-13 ENCOUNTER — Encounter: Payer: Self-pay | Admitting: Nurse Practitioner

## 2022-03-13 ENCOUNTER — Ambulatory Visit: Payer: Self-pay

## 2022-03-13 DIAGNOSIS — B9689 Other specified bacterial agents as the cause of diseases classified elsewhere: Secondary | ICD-10-CM | POA: Diagnosis not present

## 2022-03-13 DIAGNOSIS — J418 Mixed simple and mucopurulent chronic bronchitis: Secondary | ICD-10-CM

## 2022-03-13 DIAGNOSIS — J208 Acute bronchitis due to other specified organisms: Secondary | ICD-10-CM | POA: Diagnosis not present

## 2022-03-13 MED ORDER — AZITHROMYCIN 250 MG PO TABS: ORAL_TABLET | ORAL | 0 refills | Status: AC

## 2022-03-13 MED ORDER — PREDNISONE 20 MG PO TABS: 40.0000 mg | ORAL_TABLET | Freq: Every day | ORAL | 0 refills | Status: DC

## 2022-03-13 NOTE — Telephone Encounter (Signed)
Pt was called and a VM was left informing patient that medication has been sent. ?

## 2022-03-13 NOTE — Telephone Encounter (Signed)
Pt has been waiting on a medication for gout / pt is in severe pain and unable to walk / this has caused him to miss his housing authority appt / please advise asap ?

## 2022-03-13 NOTE — Progress Notes (Addendum)
Virtual Visit via Telephone Note ? I discussed the limitations, risks, security and privacy concerns of performing an evaluation and management service by telephone and the availability of in person appointments. I also discussed with the patient that there may be a patient responsible charge related to this service. The patient expressed understanding and agreed to proceed.  ? ? ?I connected with Johnny [name] on 03/13/22  at   2:10 PM EDT  EDT by telephone and verified that I am speaking with the correct person using two identifiers. ? ?Location of Patient: ?Private Residence ?  ?Location of Provider: ?Scientist, research (physical sciences) and [employer] Office  ?  ?Persons participating in Telemedicine visit: ?Johnny Rankins FNP-BC ?Johnny [name]  ?  ?History of Present Illness: ?Telemedicine visit for: Bronchitis  ?He has a past medical history of Adenomatous colon polyp, CHF, Chronic kidney disease, COPD, Diverticulosis, External otitis of right ear (11/10/2018), Hepatitis C, Hypertension, OSA  (11/03/2015), and Strep throat (01/2021).  ? ?Acute Bronchitis: Patient presents for presents evaluation of dyspnea, productive cough with sputum described as mucoid, and wheezing. Symptoms began a few weeks ago and are unchanged since that time.  Past history is significant for COPD.  ?He does continue to smoke tobacco. Denies fever. Endorses adherence with nebulizers, SABA and Dulera.  ? ?He also has concerns of gout flare earlier today however his PCP was notified and has since sent prednisone. He states he was told he could not take colchicine any longer due to "my kidneys". He is still however taking allopurinol as prescribed.  ?Past Medical History:  ?Diagnosis Date  ? Adenomatous colon polyp   ? tubular  ? CHF (congestive heart failure) ([place])   ? Chronic kidney disease   ? COPD (chronic obstructive pulmonary disease) ([place])   ? Diverticulosis   ? External otitis of right ear 11/10/2018  ? Hepatitis C   ? Hypertension   ? OSA  (obstructive sleep apnea) 11/03/2015  ? Strep throat 01/2021  ?  ?Past Surgical History:  ?Procedure Laterality Date  ? AV FISTULA PLACEMENT  09/12/2009  ? Left arm AVF  ?  ?Family History  ?Problem Relation Age of Onset  ? Hypertension Mother   ? Diabetes Mother   ? Colon cancer Neg Hx   ?  ?Social History  ? ?Socioeconomic History  ? Marital status: Divorced  ?  Spouse name: Not on file  ? Number of children: Not on file  ? Years of education: 46  ? Highest education level: Not on file  ?Occupational History  ? Occupation: Electronics engineer  ?Tobacco Use  ? Smoking status: Former  ?  Packs/day: 0.10  ?  Types: Cigarettes  ?  Quit date: 04/02/2011  ?  Years since quitting: 10.9  ? Smokeless tobacco: Never  ? Tobacco comments:  ?  trying to quit  ?Vaping Use  ? Vaping Use: Never used  ?Substance and Sexual Activity  ? Alcohol use: Never  ?  Alcohol/week: 0.0 standard drinks  ? Drug use: No  ? Sexual activity: Not Currently  ?Other Topics Concern  ? Not on file  ?Social History Narrative  ? Lives at home alone.  ? Right-handed.   ? Occasional caffeine use.  ? ?Social Determinants of Health  ? ?Financial Resource Strain: Not on file  ?Food Insecurity: Not on file  ?Transportation Needs: Not on file  ?Physical Activity: Not on file  ?Stress: Not on file  ?Social Connections: Not on file  ?  ? ?Observations/Objective: ?  Awake, alert and oriented x 3 ? ? ?Review of Systems  ?Constitutional:  Negative for fever, malaise/fatigue and weight loss.  ?HENT: Negative.  Negative for nosebleeds.   ?Eyes: Negative.  Negative for blurred vision, double vision and photophobia.  ?Respiratory:  Positive for cough, sputum production, shortness of breath and wheezing. Negative for hemoptysis.   ?Cardiovascular: Negative.  Negative for chest pain, palpitations and leg swelling.  ?Gastrointestinal: Negative.  Negative for heartburn, nausea and vomiting.  ?Musculoskeletal: Negative.  Negative for myalgias.  ?Neurological: Negative.  Negative  for dizziness, focal weakness, seizures and headaches.  ?Psychiatric/Behavioral: Negative.  Negative for suicidal ideas.    ?Assessment and Plan: ?Diagnoses and all orders for this visit: ? ?Mixed simple and mucopurulent chronic bronchitis ([place]) ? ?Acute bronchitis, bacterial ?-     azithromycin (ZITHROMAX) 250 MG tablet; Take 2 tablets on day 1, then 1 tablet daily on days 2 through 5 ? ?  ? ?Follow Up Instructions ?No follow-ups on file.  ? ?  ?I discussed the assessment and treatment plan with the patient. The patient was provided an opportunity to ask questions and all were answered. The patient agreed with the plan and demonstrated an understanding of the instructions. ?  ?The patient was advised to call back or seek an in-person evaluation if the symptoms worsen or if the condition fails to improve as anticipated. ? ?I provided 12 minutes of non-face-to-face time during this encounter including median intraservice time, reviewing previous notes, labs, imaging, medications and explaining diagnosis and management. ? ?[name], FNP-BC  ?

## 2022-03-13 NOTE — Telephone Encounter (Signed)
Prescription has been sent.

## 2022-03-13 NOTE — Telephone Encounter (Signed)
Called pt unable to reach left VM to call back . Reminded of appt today ?

## 2022-03-13 NOTE — Telephone Encounter (Signed)
?  Chief Complaint: ankle pain Symptoms: unable to stand due to pain, cough with wheezing ?Frequency: 3 days ?Pertinent Negatives: Patient denies calf pain or swelling, rash, fever ?Disposition: '[]'$ ED /'[]'$ Urgent Care (no appt availability in office) / '[x]'$ Appointment(In office/virtual)/ '[]'$  [facility]/ '[]'$ Home Care/ '[]'$ Refused Recommended Disposition /'[]'$ [facility]/ '[]'$  Follow-up with PCP ?Additional Notes: pt unable to come to office due to pain. VV appt given. ? ? ? ?Reason for Disposition ? [1] SEVERE pain (e.g., excruciating, unable to walk) AND [2] not improved after 2 hours of pain medicine ? ?Answer Assessment - Initial Assessment Questions ?1. ONSET: "When did the pain start?"  ?    3 days ?2. LOCATION: "Where is the pain located?"  ?    ankles ?3. PAIN: "How bad is the pain?"    (Scale 1-10; or mild, moderate, severe) ? - MILD (1-3): doesn't interfere with normal activities.  ? - MODERATE (4-7): interferes with normal activities (e.g., work or school) or awakens from sleep, limping.  ? - SEVERE (8-10): excruciating pain, unable to do any normal activities, unable to walk.  ?    severe ?4. WORK OR EXERCISE: "Has there been any recent work or exercise that involved this part of the body?"  ?    no ?5. CAUSE: "What do you think is causing the ankle pain?" ?    gout ?6. OTHER SYMPTOMS: "Do you have any other symptoms?" (e.g., calf pain, rash, fever, swelling) ?    Cough x 1 week, coughing episode ?7. PREGNANCY: "Is there any chance you are pregnant?" "When was your last menstrual period?" ?    N/a ? ?Protocols used: Ankle Pain-A-AH ? ?

## 2022-03-16 DIAGNOSIS — I5030 Unspecified diastolic (congestive) heart failure: Secondary | ICD-10-CM | POA: Diagnosis not present

## 2022-03-16 DIAGNOSIS — E785 Hyperlipidemia, unspecified: Secondary | ICD-10-CM | POA: Diagnosis not present

## 2022-03-16 DIAGNOSIS — D631 Anemia in chronic kidney disease: Secondary | ICD-10-CM | POA: Diagnosis not present

## 2022-03-16 DIAGNOSIS — I770 Arteriovenous fistula, acquired: Secondary | ICD-10-CM | POA: Diagnosis not present

## 2022-03-16 DIAGNOSIS — N185 Chronic kidney disease, stage 5: Secondary | ICD-10-CM | POA: Diagnosis not present

## 2022-03-16 DIAGNOSIS — N2581 Secondary hyperparathyroidism of renal origin: Secondary | ICD-10-CM | POA: Diagnosis not present

## 2022-03-16 DIAGNOSIS — I120 Hypertensive chronic kidney disease with stage 5 chronic kidney disease or end stage renal disease: Secondary | ICD-10-CM | POA: Diagnosis not present

## 2022-03-19 ENCOUNTER — Other Ambulatory Visit: Payer: Self-pay

## 2022-03-26 ENCOUNTER — Telehealth: Payer: Self-pay | Admitting: Family Medicine

## 2022-03-26 DIAGNOSIS — M10062 Idiopathic gout, left knee: Secondary | ICD-10-CM

## 2022-03-26 NOTE — Telephone Encounter (Signed)
Pt called in to request a refill for his Rx for predniSONE (DELTASONE) 20 MG tablet . Pt says that he was told by his pharmacy to contact his PCP to request. Pt says that Rx has been requested several times. Pt has a hospital follow up on 03/28/22.   Pharmacy: [place] ([place]), [place] - [address]  [address], [place] [place] [address]

## 2022-03-27 MED ORDER — PREDNISONE 20 MG PO TABS: 20.0000 mg | ORAL_TABLET | Freq: Every day | ORAL | 0 refills | Status: DC

## 2022-03-28 ENCOUNTER — Ambulatory Visit: Payer: Medicaid Other | Attending: Family Medicine | Admitting: Physician Assistant

## 2022-03-28 DIAGNOSIS — M79671 Pain in right foot: Secondary | ICD-10-CM

## 2022-03-28 DIAGNOSIS — M1A00X0 Idiopathic chronic gout, unspecified site, without tophus (tophi): Secondary | ICD-10-CM

## 2022-03-28 MED ORDER — PREDNISONE 20 MG PO TABS: 20.0000 mg | ORAL_TABLET | Freq: Every day | ORAL | 0 refills | Status: DC

## 2022-03-28 NOTE — Progress Notes (Signed)
Patient ID: Johnny [name], male   DOB: [birth year], 61 y.o.   MRN: [id number] Virtual Visit via Telephone Note  I connected with [name] on 03/28/22 at 10:50 AM EDT by telephone and verified that I am speaking with the correct person using two identifiers.  Location: Patient: home Provider: [place] office   I discussed the limitations, risks, security and privacy concerns of performing an evaluation and management service by telephone and the availability of in person appointments. I also discussed with the patient that there may be a patient responsible charge related to this service. The patient expressed understanding and agreed to proceed.   History of Present Illness:  patient is having a gout flare and requested televisit.  He is having a flare in his R foot currently and is having chronic heel pain he is requesting an xray for.  He last took prednisone about about 1 month ago.  He does not have diabetes.    Observations/Objective:  NAD.  A&Ox3   Assessment and Plan: 1. Idiopathic chronic gout without tophus, unspecified site - DG Foot Complete Right; Future - predniSONE (DELTASONE) 20 MG tablet; Take 1 tablet (20 mg total) by mouth daily with breakfast.  Dispense: 5 tablet; Refill: 0  2. Right foot pain - DG Foot Complete Right; Future    Follow Up Instructions: See PCP in 1-2 months for chronic conditions   I discussed the assessment and treatment plan with the patient. The patient was provided an opportunity to ask questions and all were answered. The patient agreed with the plan and demonstrated an understanding of the instructions.   The patient was advised to call back or seek an in-person evaluation if the symptoms worsen or if the condition fails to improve as anticipated.  I provided 13 minutes of non-face-to-face time during this encounter.   Freeman Caldron, PA-C

## 2022-03-28 NOTE — Telephone Encounter (Signed)
Patient name and DOB verified.  Patient aware that medication was sent in by Dr. Wynetta Emery on yesterday at 1732.

## 2022-03-28 NOTE — Telephone Encounter (Signed)
Pt called this morning and stated that [facility] advised him that the RX for prednisone couldn't be released to him until provider authorized it / please advise asap

## 2022-03-30 ENCOUNTER — Other Ambulatory Visit: Payer: Self-pay | Admitting: Physician Assistant

## 2022-04-27 ENCOUNTER — Ambulatory Visit: Payer: Self-pay

## 2022-04-27 ENCOUNTER — Other Ambulatory Visit: Payer: Self-pay | Admitting: Internal Medicine

## 2022-04-27 DIAGNOSIS — I10 Essential (primary) hypertension: Secondary | ICD-10-CM

## 2022-05-03 ENCOUNTER — Other Ambulatory Visit: Payer: Self-pay | Admitting: Physician Assistant

## 2022-05-03 DIAGNOSIS — M1A00X0 Idiopathic chronic gout, unspecified site, without tophus (tophi): Secondary | ICD-10-CM

## 2022-05-03 NOTE — Telephone Encounter (Signed)
Requested medication (s) are due for refill today - provider review   Requested medication (s) are on the active medication list -yes  Future visit scheduled -yes  Last refill: 03/28/22 #5  Notes to clinic: non delegated Rx  Requested Prescriptions  Pending Prescriptions Disp Refills   predniSONE (DELTASONE) 20 MG tablet [Pharmacy Med Name: predniSONE 20 MG Oral Tablet] 5 tablet 0    Sig: Take 1 tablet by mouth once daily with breakfast     Not Delegated - Endocrinology:  Oral Corticosteroids Failed - 05/03/2022  9:54 AM      Failed - This refill cannot be delegated      Failed - Manual Review: Eye exam for IOP if prolonged treatment      Failed - Glucose (serum) in normal range and within 180 days    Glucose, Bld  Date Value Ref Range Status  11/30/2021 113 (H) 70 - 99 mg/dL Final    Comment:    Glucose reference range applies only to samples taken after fasting for at least 8 hours.         Failed - Last BP in normal range    BP Readings from Last 1 Encounters:  12/01/21 (!) 140/100         Failed - Bone Mineral Density or Dexa Scan completed in the last 2 years      Passed - K in normal range and within 180 days    Potassium  Date Value Ref Range Status  11/30/2021 4.4 3.5 - 5.1 mmol/L Final         Passed - Na in normal range and within 180 days    Sodium  Date Value Ref Range Status  11/30/2021 135 135 - 145 mmol/L Final  11/08/2020 135 134 - 144 mmol/L Final         Passed - Valid encounter within last 6 months    Recent Outpatient Visits           1 month ago Idiopathic chronic gout without tophus, unspecified site   [facility], [place], [name]   1 month ago Mixed simple and mucopurulent chronic bronchitis [facility])   [place], Vernia Buff, NP   4 months ago Hospital discharge follow-up   [place], [place]   10 months ago    [name], Dionne Bucy, [name]   1 year ago Strep pharyngitis   [place], Dionne Bucy, [name]       Future Appointments             In 3 weeks Charlott Rakes, MD [place]               Requested Prescriptions  Pending Prescriptions Disp Refills   predniSONE (DELTASONE) 20 MG tablet [Pharmacy Med Name: predniSONE 20 MG Oral Tablet] 5 tablet 0    Sig: Take 1 tablet by mouth once daily with breakfast     Not Delegated - Endocrinology:  Oral Corticosteroids Failed - 05/03/2022  9:54 AM      Failed - This refill cannot be delegated      Failed - Manual Review: Eye exam for IOP if prolonged treatment      Failed - Glucose (serum) in normal range and within 180 days    Glucose, Bld  Date Value Ref Range  Status  11/30/2021 113 (H) 70 - 99 mg/dL Final    Comment:    Glucose reference range applies only to samples taken after fasting for at least 8 hours.         Failed - Last BP in normal range    BP Readings from Last 1 Encounters:  12/01/21 (!) 140/100         Failed - Bone Mineral Density or Dexa Scan completed in the last 2 years      Passed - K in normal range and within 180 days    Potassium  Date Value Ref Range Status  11/30/2021 4.4 3.5 - 5.1 mmol/L Final         Passed - Na in normal range and within 180 days    Sodium  Date Value Ref Range Status  11/30/2021 135 135 - 145 mmol/L Final  11/08/2020 135 134 - 144 mmol/L Final         Passed - Valid encounter within last 6 months    Recent Outpatient Visits           1 month ago Idiopathic chronic gout without tophus, unspecified site   [place], [name]   1 month ago Mixed simple and mucopurulent chronic bronchitis [facility])   [place], Vernia Buff, NP   4 months ago Hospital discharge follow-up   [place], [place]   10 months ago    [place], [name]   1 year ago Strep pharyngitis   [place], [name]       Future Appointments             In 3 weeks Charlott Rakes, MD [place]

## 2022-05-11 ENCOUNTER — Telehealth: Payer: Self-pay

## 2022-05-11 NOTE — Telephone Encounter (Signed)
Does patient qualify for [facility] service.

## 2022-05-11 NOTE — Telephone Encounter (Signed)
No he does not

## 2022-05-14 NOTE — Telephone Encounter (Signed)
Pt was called and informed that he does not qualify.

## 2022-05-18 ENCOUNTER — Other Ambulatory Visit: Payer: Self-pay | Admitting: Family Medicine

## 2022-05-18 DIAGNOSIS — I10 Essential (primary) hypertension: Secondary | ICD-10-CM

## 2022-05-18 NOTE — Telephone Encounter (Signed)
Pt called to say he is out and would like this refill asap

## 2022-05-18 NOTE — Telephone Encounter (Signed)
Out of medication appt in 1 week  Requested Prescriptions  Pending Prescriptions Disp Refills  . amLODipine (NORVASC) 10 MG tablet [Pharmacy Med Name: amLODIPine Besylate 10 MG Oral Tablet] 30 tablet 0    Sig: Take 1 tablet by mouth once daily     Cardiovascular: Calcium Channel Blockers 2 Failed - 05/18/2022  1:29 PM      Failed - Last BP in normal range    BP Readings from Last 1 Encounters:  12/01/21 (!) 140/100         Passed - Last Heart Rate in normal range    Pulse Readings from Last 1 Encounters:  12/01/21 79         Passed - Valid encounter within last 6 months    Recent Outpatient Visits          1 month ago Idiopathic chronic gout without tophus, unspecified site   [place], [name]   2 months ago Mixed simple and mucopurulent chronic bronchitis [facility])   [place], Vernia Buff, NP   5 months ago Hospital discharge follow-up   [place], [place]   11 months ago    [place], [name]   1 year ago Strep pharyngitis   [place], [name]      Future Appointments            In 1 week Charlott Rakes, MD [place]

## 2022-05-23 ENCOUNTER — Encounter (HOSPITAL_COMMUNITY): Payer: Self-pay | Admitting: Emergency Medicine

## 2022-05-23 ENCOUNTER — Observation Stay (HOSPITAL_COMMUNITY)
Admission: EM | Admit: 2022-05-23 | Discharge: 2022-05-24 | Disposition: A | Discharge status: Home / Self Care | Payer: Medicaid Other | Attending: Student in an Organized Health Care Education/Training Program | Admitting: Student in an Organized Health Care Education/Training Program

## 2022-05-23 ENCOUNTER — Emergency Department (HOSPITAL_COMMUNITY): Payer: Medicaid Other

## 2022-05-23 ENCOUNTER — Other Ambulatory Visit: Payer: Self-pay

## 2022-05-23 ENCOUNTER — Observation Stay (HOSPITAL_COMMUNITY): Payer: Medicaid Other

## 2022-05-23 DIAGNOSIS — I509 Heart failure, unspecified: Secondary | ICD-10-CM | POA: Diagnosis not present

## 2022-05-23 DIAGNOSIS — D631 Anemia in chronic kidney disease: Secondary | ICD-10-CM | POA: Insufficient documentation

## 2022-05-23 DIAGNOSIS — R0602 Shortness of breath: Secondary | ICD-10-CM | POA: Diagnosis not present

## 2022-05-23 DIAGNOSIS — Z79899 Other long term (current) drug therapy: Secondary | ICD-10-CM | POA: Diagnosis not present

## 2022-05-23 DIAGNOSIS — M25462 Effusion, left knee: Secondary | ICD-10-CM | POA: Insufficient documentation

## 2022-05-23 DIAGNOSIS — E872 Acidosis, unspecified: Secondary | ICD-10-CM | POA: Insufficient documentation

## 2022-05-23 DIAGNOSIS — M79671 Pain in right foot: Secondary | ICD-10-CM

## 2022-05-23 DIAGNOSIS — N185 Chronic kidney disease, stage 5: Secondary | ICD-10-CM | POA: Insufficient documentation

## 2022-05-23 DIAGNOSIS — I10 Essential (primary) hypertension: Secondary | ICD-10-CM | POA: Diagnosis present

## 2022-05-23 DIAGNOSIS — M109 Gout, unspecified: Secondary | ICD-10-CM | POA: Insufficient documentation

## 2022-05-23 DIAGNOSIS — J9601 Acute respiratory failure with hypoxia: Secondary | ICD-10-CM | POA: Insufficient documentation

## 2022-05-23 DIAGNOSIS — Z20822 Contact with and (suspected) exposure to covid-19: Secondary | ICD-10-CM | POA: Insufficient documentation

## 2022-05-23 DIAGNOSIS — J441 Chronic obstructive pulmonary disease with (acute) exacerbation: Principal | ICD-10-CM | POA: Insufficient documentation

## 2022-05-23 DIAGNOSIS — E039 Hypothyroidism, unspecified: Secondary | ICD-10-CM | POA: Insufficient documentation

## 2022-05-23 DIAGNOSIS — M79605 Pain in left leg: Secondary | ICD-10-CM | POA: Diagnosis not present

## 2022-05-23 DIAGNOSIS — Z87891 Personal history of nicotine dependence: Secondary | ICD-10-CM | POA: Diagnosis not present

## 2022-05-23 DIAGNOSIS — I132 Hypertensive heart and chronic kidney disease with heart failure and with stage 5 chronic kidney disease, or end stage renal disease: Secondary | ICD-10-CM | POA: Insufficient documentation

## 2022-05-23 DIAGNOSIS — G4733 Obstructive sleep apnea (adult) (pediatric): Secondary | ICD-10-CM | POA: Diagnosis present

## 2022-05-23 LAB — SARS CORONAVIRUS 2 BY RT PCR: SARS Coronavirus 2 by RT PCR: NEGATIVE

## 2022-05-23 LAB — BASIC METABOLIC PANEL
Anion gap: 16 — ABNORMAL HIGH (ref 5–15)
BUN: 84 mg/dL — ABNORMAL HIGH (ref 6–20)
CO2: 18 mmol/L — ABNORMAL LOW (ref 22–32)
Calcium: 8.9 mg/dL (ref 8.9–10.3)
Chloride: 104 mmol/L (ref 98–111)
Creatinine, Ser: 7.93 mg/dL — ABNORMAL HIGH (ref 0.61–1.24)
GFR, Estimated: 7 mL/min — ABNORMAL LOW (ref >60)
Glucose, Bld: 101 mg/dL — ABNORMAL HIGH (ref 70–99)
Potassium: 3.8 mmol/L (ref 3.5–5.1)
Sodium: 138 mmol/L (ref 135–145)

## 2022-05-23 LAB — CBC
HCT: 31.5 % — ABNORMAL LOW (ref 39.0–52.0)
Hemoglobin: 10.6 g/dL — ABNORMAL LOW (ref 13.0–17.0)
MCH: 25.4 pg — ABNORMAL LOW (ref 26.0–34.0)
MCHC: 33.7 g/dL (ref 30.0–36.0)
MCV: 75.5 fL — ABNORMAL LOW (ref 80.0–100.0)
Platelets: 174 10*3/uL (ref 150–400)
RBC: 4.17 MIL/uL — ABNORMAL LOW (ref 4.22–5.81)
RDW: 16.8 % — ABNORMAL HIGH (ref 11.5–15.5)
WBC: 13.5 10*3/uL — ABNORMAL HIGH (ref 4.0–10.5)
nRBC: 0 % (ref 0.0–0.2)

## 2022-05-23 LAB — I-STAT VENOUS BLOOD GAS, ED
Acid-base deficit: 6 mmol/L — ABNORMAL HIGH (ref 0.0–2.0)
Bicarbonate: 20.9 mmol/L (ref 20.0–28.0)
Calcium, Ion: 1.18 mmol/L (ref 1.15–1.40)
HCT: 33 % — ABNORMAL LOW (ref 39.0–52.0)
Hemoglobin: 11.2 g/dL — ABNORMAL LOW (ref 13.0–17.0)
O2 Saturation: 78 %
Potassium: 3.9 mmol/L (ref 3.5–5.1)
Sodium: 137 mmol/L (ref 135–145)
TCO2: 22 mmol/L (ref 22–32)
pCO2, Ven: 47 mmHg (ref 44–60)
pH, Ven: 7.257 (ref 7.25–7.43)
pO2, Ven: 49 mmHg — ABNORMAL HIGH (ref 32–45)

## 2022-05-23 LAB — BRAIN NATRIURETIC PEPTIDE: B Natriuretic Peptide: 163.1 pg/mL — ABNORMAL HIGH (ref 0.0–100.0)

## 2022-05-23 LAB — TROPONIN I (HIGH SENSITIVITY)
Troponin I (High Sensitivity): 36 ng/L — ABNORMAL HIGH (ref <18)
Troponin I (High Sensitivity): 40 ng/L — ABNORMAL HIGH (ref <18)

## 2022-05-23 MED ORDER — FUROSEMIDE 40 MG PO TABS
40.0000 mg | ORAL_TABLET | Freq: Two times a day (BID) | ORAL | Status: DC
  Administered 2022-05-24: 40 mg via ORAL
  Filled 2022-05-23 (×2): qty 1
  Filled 2022-05-23: qty 2

## 2022-05-23 MED ORDER — DEXAMETHASONE SODIUM PHOSPHATE 10 MG/ML IJ SOLN: 10.0000 mg | Freq: Once | INTRAMUSCULAR | Status: DC

## 2022-05-23 MED ORDER — MOMETASONE FURO-FORMOTEROL FUM 100-5 MCG/ACT IN AERO
2.0000 | INHALATION_SPRAY | Freq: Two times a day (BID) | RESPIRATORY_TRACT | Status: DC
  Administered 2022-05-24: 2 via RESPIRATORY_TRACT
  Filled 2022-05-23: qty 8.8

## 2022-05-23 MED ORDER — IPRATROPIUM-ALBUTEROL 0.5-2.5 (3) MG/3ML IN SOLN
3.0000 mL | Freq: Once | RESPIRATORY_TRACT | Status: AC
  Administered 2022-05-23: 3 mL via RESPIRATORY_TRACT
  Filled 2022-05-23: qty 3

## 2022-05-23 MED ORDER — LABETALOL HCL 300 MG PO TABS
300.0000 mg | ORAL_TABLET | Freq: Two times a day (BID) | ORAL | Status: DC
  Administered 2022-05-23 – 2022-05-24 (×2): 300 mg via ORAL
  Filled 2022-05-23 (×3): qty 1

## 2022-05-23 MED ORDER — ALLOPURINOL 100 MG PO TABS
100.0000 mg | ORAL_TABLET | Freq: Every day | ORAL | Status: DC
  Administered 2022-05-23 – 2022-05-24 (×2): 100 mg via ORAL
  Filled 2022-05-23 (×2): qty 1

## 2022-05-23 MED ORDER — DEXAMETHASONE SODIUM PHOSPHATE 10 MG/ML IJ SOLN
10.0000 mg | Freq: Once | INTRAMUSCULAR | Status: AC
  Administered 2022-05-23: 10 mg via INTRAVENOUS
  Filled 2022-05-23: qty 1

## 2022-05-23 MED ORDER — PREDNISONE 20 MG PO TABS
40.0000 mg | ORAL_TABLET | Freq: Every day | ORAL | Status: DC
  Administered 2022-05-24: 40 mg via ORAL
  Filled 2022-05-23: qty 2

## 2022-05-23 MED ORDER — AMLODIPINE BESYLATE 10 MG PO TABS
10.0000 mg | ORAL_TABLET | Freq: Every day | ORAL | Status: DC
  Administered 2022-05-24: 10 mg via ORAL
  Filled 2022-05-23: qty 1

## 2022-05-23 MED ORDER — ONDANSETRON HCL 4 MG PO TABS: 4.0000 mg | ORAL_TABLET | Freq: Four times a day (QID) | ORAL | Status: DC | PRN

## 2022-05-23 MED ORDER — ACETAMINOPHEN 325 MG PO TABS: 650.0000 mg | ORAL_TABLET | Freq: Four times a day (QID) | ORAL | Status: DC | PRN

## 2022-05-23 MED ORDER — SENNOSIDES-DOCUSATE SODIUM 8.6-50 MG PO TABS: 1.0000 | ORAL_TABLET | Freq: Every evening | ORAL | Status: DC | PRN

## 2022-05-23 MED ORDER — CALCITRIOL 0.5 MCG PO CAPS
0.5000 ug | ORAL_CAPSULE | Freq: Every day | ORAL | Status: DC
Start: 2022-05-24 — End: 2022-05-24
  Administered 2022-05-24: 0.5 ug via ORAL
  Filled 2022-05-23: qty 1

## 2022-05-23 MED ORDER — LEVOTHYROXINE SODIUM 100 MCG PO TABS
100.0000 ug | ORAL_TABLET | Freq: Every day | ORAL | Status: DC
  Administered 2022-05-24: 100 ug via ORAL
  Filled 2022-05-23: qty 1

## 2022-05-23 MED ORDER — IPRATROPIUM-ALBUTEROL 0.5-2.5 (3) MG/3ML IN SOLN
3.0000 mL | RESPIRATORY_TRACT | Status: DC | PRN
Start: 2022-05-23 — End: 2022-05-24
  Administered 2022-05-24: 3 mL via RESPIRATORY_TRACT
  Filled 2022-05-23 (×2): qty 3

## 2022-05-23 MED ORDER — HEPARIN SODIUM (PORCINE) 5000 UNIT/ML IJ SOLN
5000.0000 [IU] | Freq: Three times a day (TID) | INTRAMUSCULAR | Status: DC
  Administered 2022-05-23 – 2022-05-24 (×2): 5000 [IU] via SUBCUTANEOUS
  Filled 2022-05-23 (×2): qty 1

## 2022-05-23 MED ORDER — ACETAMINOPHEN 650 MG RE SUPP: 650.0000 mg | Freq: Four times a day (QID) | RECTAL | Status: DC | PRN

## 2022-05-23 MED ORDER — SODIUM BICARBONATE 650 MG PO TABS
1300.0000 mg | ORAL_TABLET | Freq: Three times a day (TID) | ORAL | Status: DC
  Administered 2022-05-23 – 2022-05-24 (×3): 1300 mg via ORAL
  Filled 2022-05-23 (×3): qty 2

## 2022-05-23 MED ORDER — GUAIFENESIN 100 MG/5ML PO LIQD
5.0000 mL | ORAL | Status: DC | PRN
Start: 2022-05-23 — End: 2022-05-24
  Administered 2022-05-23 – 2022-05-24 (×2): 5 mL via ORAL
  Filled 2022-05-23 (×2): qty 5

## 2022-05-23 MED ORDER — ONDANSETRON HCL 4 MG/2ML IJ SOLN: 4.0000 mg | Freq: Four times a day (QID) | INTRAMUSCULAR | Status: DC | PRN

## 2022-05-23 MED ORDER — ATORVASTATIN CALCIUM 40 MG PO TABS
40.0000 mg | ORAL_TABLET | Freq: Every day | ORAL | Status: DC
Start: 2022-05-23 — End: 2022-05-24
  Administered 2022-05-24: 40 mg via ORAL
  Filled 2022-05-23 (×2): qty 1

## 2022-05-23 NOTE — ED Triage Notes (Signed)
Patient here with complaint of shortness of breath that started a few weeks ago and has gotten progressively worse. Patient has history of CHF, asthma, and CKD. Patient has fistula in left upper arm but for eventual dialysis but has had fistula for 15 years and has never been used. Patient had audible inspiratory wheezing, is speaking in complete sentences, and is in no apparent distress at this time.

## 2022-05-23 NOTE — Consult Note (Signed)
Nephrology Consult   Assessment/Recommendations:   CKD5 -stable CKD5, not exhibiting uremic symptoms or volume overload. Very close to needing to start dialysis which can be done as an outpatient provided he is stable here and no indications arise in the interim. Follows w/ Dr. Joylene Grapes, last Cr in 03/2022 was 7.6 with a eGFR of 8. Needs to have close/frequent follow up as an outpatient -does not have an indication to start HD at this time -Continue to monitor daily Cr, Dose meds for GFR<15 -Maintain MAP>65 for optimal renal perfusion.  -Avoid nephrotoxic medications including NSAIDs and iodinated intravenous contrast exposure unless the latter is absolutely indicated.  Preferred narcotic agents for pain control are hydromorphone, fentanyl, and methadone. Morphine should not be used. Avoid Baclofen and avoid oral sodium phosphate and magnesium citrate based laxatives / bowel preps. Continue strict Input and Output monitoring. Will monitor the patient closely with you and intervene or adjust therapy as indicated by changes in clinical status/labs   Metabolic acidosis, +anion gap -likely secondary to CKD5. Will start nahco3 1337m TID  Hypertension: -resume home meds  AHRF, COPD -likely secondary to acute on chronic COPD exacerbation -CXR without pulm edema -COPD mgmt per primary service  Rt foot pain -xray neg, per primary service  Anemia due to CKD: -Transfuse for Hgb<7 g/dL -hgb acceptable for CKD  CKD MBD -check phos, renal diet   Recommendations conveyed to primary service.    [facility] 05/23/2022 3:52 PM   _____________________________________________________________________________________   History of Present Illness: Johnny WEGENERis a/an 61y.o. male with a past medical history of CKD5, LUE AVF, HTN, OSA, COPD, CHF, [facility]LD [facility]. Had an admission back in Jan for volume overload which he was worried  about. CXR clear here, has not been having swelling, still urinating. Has been having rt foot pain, more so in the ball of his foot. Otherwise denies any fevers, chills, chest pain, n/v, dysgeusia, brain fog, intractable hiccups/pruritis, loss of appetite. Has a pre-existing LUE AVF for many years.   Follows with Dr. PJoylene Grapesat the office, has missed appointments especially a more recent appt about 2 weeks ago.   Medications:  No current facility-administered medications for this encounter.   Current Outpatient Medications  Medication Sig Dispense Refill   albuterol (VENTOLIN HFA) 108 (90 Base) MCG/ACT inhaler Inhale 2 puffs into the lungs every 6 (six) hours as needed for wheezing or shortness of breath. 18 g 2   allopurinol (ZYLOPRIM) 100 MG tablet Take 1 tablet (100 mg total) by mouth daily. 30 tablet 3   amLODipine (NORVASC) 10 MG tablet Take 1 tablet by mouth once daily 30 tablet 0   atorvastatin (LIPITOR) 40 MG tablet Take 1 tablet (40 mg total) by mouth daily. 30 tablet 2   calcitRIOL (ROCALTROL) 0.5 MCG capsule Take 0.5 mcg by mouth daily.     furosemide (LASIX) 40 MG tablet Take 1 tablet by mouth twice daily 60 tablet 1   ipratropium-albuterol (DUONEB) 0.5-2.5 (3) MG/3ML SOLN USE 1 AMPULE IN NEBULIZER EVERY 6 HOURS AS NEEDED 90 mL 0   labetalol (NORMODYNE) 300 MG tablet Take 1 tablet by mouth twice daily 60 tablet 2   levothyroxine (SYNTHROID) 100 MCG tablet Take 1 tablet (100 mcg total) by mouth daily. 30 tablet 0   mometasone-formoterol (DULERA) 100-5 MCG/ACT AERO Inhale 2 puffs into the lungs 2 (two) times daily. 13 g 6   triamcinolone (KENALOG) 0.1 %  Apply 1 application topically 2 (two) times daily. 45 g 1   Blood Pressure Monitoring (3 SERIES BP MONITOR/UPPER ARM) DEVI 1 Units by Does not apply route 2 (two) times daily. 1 each 0   clotrimazole-betamethasone (LOTRISONE) cream Apply to affected area 2 times daily. (Patient not taking: Reported on 05/23/2022) 30 g 0   isosorbide  mononitrate (IMDUR) 30 MG 24 hr tablet Take 1 tablet (30 mg total) by mouth daily. (Patient not taking: Reported on 05/23/2022) 30 tablet 0   Misc. Devices MISC Blood Pressure monitor. Dx: HTN 1 each 0   predniSONE (DELTASONE) 20 MG tablet Take 1 tablet (20 mg total) by mouth daily with breakfast. (Patient not taking: Reported on 05/23/2022) 5 tablet 0     ALLERGIES Naproxen  MEDICAL HISTORY Past Medical History:  Diagnosis Date   Adenomatous colon polyp    tubular   CHF (congestive heart failure) ([facility])    Chronic kidney disease    COPD (chronic obstructive pulmonary disease) ([place])    Diverticulosis    External otitis of right ear 11/10/2018   Hepatitis C    Hypertension    OSA (obstructive sleep apnea) 11/03/2015   Strep throat 01/2021     SOCIAL HISTORY Social History   Socioeconomic History   Marital status: Divorced    Spouse name: Not on file   Number of children: Not on file   Years of education: 14   Highest education level: Not on file  Occupational History   Occupation: Electronics engineer  Tobacco Use   Smoking status: Former    Packs/day: 0.10    Types: Cigarettes    Quit date: 04/02/2011    Years since quitting: 11.1   Smokeless tobacco: Never   Tobacco comments:    trying to quit  Vaping Use   Vaping Use: Never used  Substance and Sexual Activity   Alcohol use: Never    Alcohol/week: 0.0 standard drinks of alcohol   Drug use: No   Sexual activity: Not Currently  Other Topics Concern   Not on file  Social History Narrative   Lives at home alone.   Right-handed.    Occasional caffeine use.   Social Determinants of Health   Financial Resource Strain: Not on file  Food Insecurity: Not on file  Transportation Needs: Not on file  Physical Activity: Not on file  Stress: Not on file  Social Connections: Not on file  Intimate Partner Violence: Not on file     FAMILY HISTORY Family History  Problem Relation Age of Onset   Hypertension Mother     Diabetes Mother    Colon cancer Neg Hx      Review of Systems: 12 systems reviewed Otherwise as per HPI, all other systems reviewed and negative  Physical Exam: Vitals:   05/23/22 1530 05/23/22 1545  BP: 124/78 130/77  Pulse: 68   Resp: 19 16  Temp:  97.9 F (36.6 C)  SpO2: 100%    No intake/output data recorded. No intake or output data in the 24 hours ending 05/23/22 1552 General: well-appearing, no acute distress HEENT: anicteric sclera, oropharynx clear without lesions CV: regular rate, normal rhythm, no murmurs, no gallops, no rubs Lungs: poor air exchange bilaterally, normal work of breathing Abd: soft, non-tender, non-distended Skin: no visible lesions or rashes Psych: alert, engaged, appropriate mood and affect Musculoskeletal: no obvious deformities, no edema Neuro: normal speech, no gross focal deficits, no asterixis Dialysis access: LUE AVF +b/t   Test Results  Reviewed Lab Results  Component Value Date   NA 137 05/23/2022   K 3.9 05/23/2022   CL 104 05/23/2022   CO2 18 (L) 05/23/2022   BUN 84 (H) 05/23/2022   CREATININE 7.93 (H) 05/23/2022   CALCIUM 8.9 05/23/2022   ALBUMIN 2.9 (L) 11/30/2021   PHOS 4.1 11/30/2021     I have reviewed all relevant outside healthcare records related to the patient's kidney injury.

## 2022-05-23 NOTE — Hospital Course (Addendum)
Johnny [name] is a 61 y.o. male with a past medical history of CHF, CKD stage 5, HTN, gout, and COPD presenting with progressive dyspnea with exertion to the ED and admitted for COPD exacerbation.  Shortness of breath Patient was admitted with SOB, cough productive of clear sputum, and reported wheezing.  The patient does not use oxygen at home and did not have increased oxygen requirement on admission.  His CXR showed no focal consolidation, pleural effusion, or evidence of pulmonary edema.  COVID PCR was negative and RPP results were pending at time of discharge.  Notably, patient reports using his Dulera LABA/ICS only rarely as rescue inhaler.  Patient counseled about importance of this medicine as maintenance therapy.  Symptoms were largely consistent with COPD exacerbation was started on DuoNeb nebulizer treatment every 4 as needed.  His home Dulera inhaler 2 puffs twice daily was restarted on admission and he was also started on prednisone 40 mg p.o. daily for 5 days.  Patient was given guaifenesin 5 mL p.o. Q4h PRN for cough.  Azithromycin was not started due to mild nature of presentation.  He improved rapidly on his treatment plan.  Patient was able to ambulate on room air with saturations remaining above 97% prior to discharge.  He was sent home with 3 remaining doses of daily prednisone 40 mg of 5 day treatment course.  Right foot pain Patient reported 1 month of right foot pain that worsens throughout the day and remits overnight.  Differential included metatarsalgia, Morton's neuroma, osteoarthritis, plantar fascitis, and gout.  Chronicity and physical was not consistent with gout and plantar fascitis while xray of right foot revealed no evidence of bony changes or injury, making osteoarthritis and Morton's neuroma less likely.  Patient's prednisone course for COPD may help address any foot inflammation and home allopurinol 100 mg PO daily was continued during hospitalization.  Metatarsalgia  thought to be plausible etiology, though other MSK etiology also possible, and insoles/orthotics and good walking shoes recommended given patient's flat feet.  Recommend outpatient follow up and management.   CKD Anion gap metabolic acidosis Anemia of inflammation Patient patient is CKD 5 per nephrology with anion gap metabolic acidosis found on admission and persistent anemia likely due to CKD.  Patient also has fistula that was placed 15 years ago but was never used.  He was adamant that he is would prefer to avoid HD at this time and nephrology stated patient does not have indications to start HD following their evaluation. Continued home calcitriol 0.5 mcg PO daily during this admission and nephrology started sodium bicarb 1300 mg TID for metabolic acidosis. Phosphate was normal. Patient kept on renal diet this admission. Hemoglobin was 10.6 and considered acceptable by nephrology for patient's stage of CKD. Recommend close follow up with nephrology outpatient.   Osteoarthritis of the left knee Patient had effusion of left knee noted on physical exam upon admission. Knee xray showed moderate progressive osteoarthritis greatest in the medial and patellofemoral compartments with moderate joint effusion. Recommend outpatient follow up for PT and possible steroid injection.   HTN Patient continued on home amlodipine 10 mg daily and home labetalol 300 mg BID this admission. Remained largely normotensive throughout hospital stay.  CHF Echocardiogram on 11/29/2021 showed LVEF of 60-65% with grade II diastolic dysfunction. Patient's symptoms and imaging not consistent with CHF exacerbation. Patient continued on home furosemide 40 mg BID.  HLD Last lipid profile on 11/10/2018 showed LDL elevated to 102 and total cholesterol elevated to 214.  Lipid profile on admission showed LDL of 49 and marked improvement from labs in 2020. Continued on home atorvastatin 40 mg daily during admission.  Hypothyroidism TSH  normal on admission and last TSH normal on 11/08/2020.  Continued on home levothyroxine 100 mcg daily throughout admission.    Discharge instructions It was a pleasure taking care of you this hospitalization and we hope you continue to feel better moving forward.  You were hospitalized because of a COPD exacerbation and we also worked on investigating your foot pain while you were here.  The shortness of breath due to COPD that brought you in has improved but it is very important that you use 2 puffs of your Dulera inhaler twice daily as prescribed to avoid future hospitalizations and worsening of your breathing.  As discussed, this crucial preventative measure helps you breathe easily in the long-term.  We do not need think you need extra oxygen at home because you did not need it in the hospital.  Please finish your remaining 3 days of prednisone to make sure your COPD is under control.  For your chronic foot pain, we recommend working on getting some insoles/orthotics to help cushion your feet and support your arch, especially because you have flat feet.  We have provided you some information on paper.  It is unclear exactly what condition is causing your pain, but you xray did not show any injuries and the pain likely has to do with your flat foot and poor shoe support of your feet when walking.  Please talk to your primary care physician for more information if needed.  We will also recommend considering outpatient physical therapy to your primary care physician.  Please continue taking all of your other home medications as prescribed including your levothyroxine, atorvastatin, furosemide, amlodipine, labetalol, and calcitriol.  A complete medication list is included in your instructions.  Please call your primary care physician or come to the ED if you experience continually worsening shortness of breath, increased cough (especially producing lots of phlegm), or new chest pain.  Please also make  sure to follow-up with your primary care physician at your appointment on Monday.   Discharge follow-up for PCP -- COPD exacerbation and inconsistent use of Dulera -- Osteoarthritis of left knee -- Right foot pain due to metatarsalgia versus Morton's neuroma versus chronic gout -- Possible physical therapy need for osteoarthritis and right foot -- CKD does not have indications to start HD at this time per nephrology, will need to maintain frequent/close follow-up with nephrology office of Dr. Joylene Grapes   Subjective This morning patient reports that his breathing has improved markedly and he is not having and shortness of breath.  He states he is feeling well.  He has not used oxygen since admission and he has been able to ambulate to the restroom without shortness of breath.  He notes that his productive cough is mostly resolved.  Patient denies chest pain, swelling of extremities, nausea, or any localized pain.  He has been eating and sleeping well since yesterday.   Physical exam General:  Adult male resting comfortably in bed in no acute distress. Cardiovascular:  RRR, no murmurs/rubs/gallops. Normal S1/S2. 2+ radial pulses. Respiratory:  Lungs CTAB. No wheezing. Normal WOB on room air. Extremities:  Edematous, nontender effusion of left knee. Tenderness to palpation over right metatarsal-phalangeal joints on plantar side without edema, erythema, or warmth to touch. Pain worse with squeezing of metatarsals by hand. No tenderness to palpation over right heel. Skin:  Dry and warm, no rashes. Psych:  Pleasant and appropriate.

## 2022-05-23 NOTE — H&P (Signed)
Date: 05/23/2022               Patient Name:  Johnny [name] MRN: [id number]  DOB: Jun 10, 1961 Age / Sex: 61 y.o., male   PCP: Charlott Rakes, MD              Medical Service: Internal Medicine Teaching Service              Attending Physician: Dr. Lalla Brothers    First Contact: Dimitry Shitarev, MS 3 Pager: [phone]  Second Contact: Dr. Nani Gasser Pager: [phone]  Third Contact Dr. Linwood Dibbles Pager: [phone]       After Hours (After 5p/  First Contact Pager: [phone]  weekends / holidays): Second Contact Pager: [phone]   Chief Complaint: Shortness of breath, right foot pain  History of Present Illness: Johnny [name] is a 61 y.o. male with a past medical history of CHF, CKD5, HTN, OSA, and COPD presenting to the ED with 1 month progressive shortness of breath and wheezing as well as 1 month of pain in his right foot.  Patient reports that he has been experiencing progressive shortness of breath for a month, particularly with exertion. He requires frequent rest when walking on flat ground.  Does not use oxygen at home and has never used supplemental oxygen before.  He endorses cough productive of white sputum for 1 week. He has used his albuterol inhaler with mild improvement. He is s/p Duoneb and Decadron in the ED and reports that these treatments helped markedly.  Patient notes that he has been hospitalized for heart failure before, but current symptoms feel different.  He denies chest pain, PND, orthopnea, and LE edema. He also denies recent fever, N/V/D, and illness.  Patient also states that he has had pain in the ball of his right foot for about a month.  The pain is not present in the morning and progressively gets worse throughout the day with use.  He also describes some right heel pain, but it is not as severe. Patient has a history of gout but states that this does not feel like gout pain.  He describes the character of the pain as dull and says that it affects  walking.  Patient also reports 2 weeks of left knee swelling.  Review of Systems: Patient reports that his appetite is "fair" and he eats once a day.  Patient states that he is making urine, drinks lots of water, and wakes up at night frequently to urinate.  He denies chest pain, blurry vision, dizziness, headaches, dysuria, hematuria, blood in stool.  Past medical history:  Past Medical History:  Diagnosis Date   Adenomatous colon polyp    tubular   CHF (congestive heart failure) ([place])    Chronic kidney disease    COPD (chronic obstructive pulmonary disease) ([place])    Diverticulosis    External otitis of right ear 11/10/2018   Hepatitis C    Hypertension    OSA (obstructive sleep apnea) 11/03/2015   Strep throat 01/2021   Meds: (reviewed in room with patient) -- Ran out of allopurinol ~ 1 week ago -- Does not take imdur -- Uses albuterol rarely -- Uses Dulera as rescue inhaler, rarely -- Taking other medications as described below: Current Meds  Medication Sig   albuterol (VENTOLIN HFA) 108 (90 Base) MCG/ACT inhaler Inhale 2 puffs into the lungs every 6 (six) hours as needed for wheezing or shortness of breath.   allopurinol (ZYLOPRIM) 100 MG tablet Take  1 tablet (100 mg total) by mouth daily.   amLODipine (NORVASC) 10 MG tablet Take 1 tablet by mouth once daily   atorvastatin (LIPITOR) 40 MG tablet Take 1 tablet (40 mg total) by mouth daily.   furosemide (LASIX) 40 MG tablet Take 1 tablet by mouth twice daily   ipratropium-albuterol (DUONEB) 0.5-2.5 (3) MG/3ML SOLN USE 1 AMPULE IN NEBULIZER EVERY 6 HOURS AS NEEDED   labetalol (NORMODYNE) 300 MG tablet Take 1 tablet by mouth twice daily   levothyroxine (SYNTHROID) 100 MCG tablet Take 1 tablet (100 mcg total) by mouth daily.   mometasone-formoterol (DULERA) 100-5 MCG/ACT AERO Inhale 2 puffs into the lungs 2 (two) times daily.   Allergies: Allergies as of 05/23/2022 - Review Complete 05/23/2022  Allergen Reaction Noted    Naproxen Other (See Comments) 05/02/2011   Family History: -- Mother had diabetes. -- No FH of lung disease, heart disease, or cancer.  Social History:  Patient goes to school for substance use disorder and mental illness, hoping to start treatment and rehabilitation facility. He lives alone and is independent in all ADLs and IADLs.  Substance use: -- Drinks a beer "every now and then," socially (1-2 times per week) -- Former cigarette smoker. Pack per week for about 10 years. Does not remember when he quit. -- History of marijuana and cocaine use in 1970s and 1980s.  Physical Exam: Blood pressure 130/77, pulse 68, temperature 97.9 F (36.6 C), temperature source Oral, resp. rate 16, SpO2 100 %. Constitutional:  Adult male resting in bed. No acute distress. Cardiovascular:  RRR, no murmurs/rubs/gallops. Normal S1/S2. Respiratory:  Lungs CTAB. No wheezing. Normal WOB on room air. Abdominal:  Nondistended, nontender to palpation. No rebound or guarding. Extremities:  No edema, cyanosis, or clubbing. MSK:  Edematous, nontender effusion of left knee. Tenderness to palpation over right metatarsal-phalangeal joints on plantar side without edema, erythema, or warmth to touch. Pain with dorsiflexion in metatarsal region of right foot. No tenderness to palpation over right heel. Skin:  Dry, warm. No rashes. Psych:  Alert and oriented x3. Pleasant and appropriate.  Labs:    Latest Ref Rng & Units 05/23/2022   10:45 AM 05/23/2022    7:59 AM 11/30/2021    4:47 AM  CMP  Glucose 70 - 99 mg/dL  101  113   BUN 6 - 20 mg/dL  84  60   Creatinine 0.61 - 1.24 mg/dL  7.93  6.02   Sodium 135 - 145 mmol/L 137  138  135   Potassium 3.5 - 5.1 mmol/L 3.9  3.8  4.4   Chloride 98 - 111 mmol/L  104  105   CO2 22 - 32 mmol/L  18  20   Calcium 8.9 - 10.3 mg/dL  8.9  7.9    -- GFR, estimated: 7     Latest Ref Rng & Units 05/23/2022   10:45 AM 05/23/2022    7:59 AM 11/29/2021    5:36 AM  CBC  WBC 4.0 -  10.5 K/uL  13.5    Hemoglobin 13.0 - 17.0 g/dL 11.2  10.6  9.9   Hematocrit 39.0 - 52.0 % 33.0  31.5  29.0   Platelets 150 - 400 K/uL  174      Ref 10:30 07:59  Troponin I (High Sensitivity) <18 ng/L 40 (H)  36 (H)    Ref 07:57 5 mo ago 5 yr ago  B Natriuretic Peptide 0.0 - 100.0 pg/mL 163.1 (H)  1,296.1 (H)  554.2 (  H)   Imaging: -- EKG: Sinus rhythm without ST elevations. -- CXR: No signs of pulmonary edema, consolidation, or infiltrates. No cardiomegaly. Gas in colon visible under left diaphragm. -- Xray right foot complete: No evidence of fracture, dislocation, or soft tissue injury.  Assessment & Plan by Problem: Principal Problem:   COPD exacerbation (HCC)  JAHRELL HAMOR is a 61 y.o. male with a past medical history of CHF, CKD, HTN, gout, and COPD admitted for COPD exacerbation.  Shortness of breath The patient does not use oxygen at home and does not have increased oxygen requirement. However, his SOB, cough, and reported wheezing are consistent with COPD exacerbation. Lack of wheezing on admission physical exam may be attributed to nebulizer treatment immediately prior. Patient reports using LABA/ICS only rarely as rescue inhaler. Will counsel about importance of this medicine as maintenance therapy. Considering sputum production and mild leukocytosis, it is important to consider infectious etiology. COVID PCR is pending. His CXR is without focal consolidation or pleural effusion. Lack of pulmonary edema and crackles makes CHF exacerbation unlikely. - Start prednisone 40 mg PO daily for 5 days through 05/27/2022 - Restart home Dulera inhaler 2 puffs BID - Start Duoneb 3 mL nebulizer Q4 PRN for wheezing and shortness of breath - Start guaifenesin 5 mL p.o. Q4h PRN for cough -- Obtain COVID PCR -- Provide education concerning Dulera inhaler use and maintenance therapy  Right foot pain Patient has a history of gout. Progressive onset of pain throughout the day and remittance  overnight is not consistent with gout. However, pain with palpation over the metatarsals could be indicative of chronic gout. The patient's prednisone for COPD exacerbation will treat gout if that is the cause of this pain. Other considerations include injury, arthritis, and plantar fascitis. Imaging rules out injury and makes arthritis less likely. Plantar fascitis is less likely due to focal nature of the patient's pain. -- Continue to monitor and reassess for improvement with steroid treatment -- Restart home allopurinol  CKD Anion gap metabolic acidosis Anemia of inflammation Patient has fistula that was placed 15 years ago. Patient is CKD5 per nephrology and anemia as well as anion gap metabolic acidosis are likely secondary to CKD. -- Restart home calcitriol 0.5 mcg PO daily -- Repeat CBC tomorrow -- Nephrology following, appreciate their recommendations:     -- Start NaCO3 1300 mg PO TID     -- Hemoglobin is acceptable given CKD, transfuse for Hgb <7     -- Check phosphate, start on renal diet w/ 1200 mL fluid restriction     -- Obtain renal function panel  Left knee effusion Possible gout flare. Will obtain xray of knee and monitor for improvement on prednisone. -- Obtain xray of knee  HTN -- Restart home amlodipine 10 mg daily -- Restart home labetalol 300 mg BID  CHF Echocardiogram on 11/29/2021 showed LVEF of 60-65% with grade II diastolic dysfunction. Patient's symptoms and imaging not consistent with CHF exacerbation. -- Restart home furosemide 40 mg BID  HLD Last lipid profile on 11/10/2018 showed LDL elevated to 102 and total cholesterol elevated to 214. -- Restart home atorvastatin 40 mg daily -- Obtain lipid profile  Hypothyroidism Last TSH on 11/08/2020 was WNL. -- Restart home levothyroxine 100 mcg -- Obtain TSH  DVT prophx: Heparin 5,000 units [place] Q8 hours Diet: Renal diet w/ 1200 mL fluid restriction Bowel: PRN Code: Full  Prior to Admission Living  Arrangement: Home Anticipated Discharge Location: Home Barriers to Discharge: Medical workup,  respiratory therapy  Dispo: Admit patient to Observation with expected length of stay less than 2 midnights.  SignedKaty Fitch, Dimitry, Medical Student 05/23/2022, 5:02 PM  Pager: [phone]   Attestation for Student Documentation:  I personally was present and performed or re-performed the history, physical exam and medical decision-making activities of this service and have verified that the service and findings are accurately documented in the student's note.  Nani Gasser, MD 05/23/2022, 5:40 PM

## 2022-05-23 NOTE — ED Notes (Signed)
Pt off floor for xray

## 2022-05-23 NOTE — ED Provider Notes (Signed)
[place] EMERGENCY DEPARTMENT Provider Note   CSN: [id number] Arrival date & time: 05/23/22  0743     History  Chief Complaint  Patient presents with   Shortness of Breath    Johnny [name] is a 61 y.o. male with a past medical history of COPD, CHF, hypertension who presents to the emergency department complaint of shortness of breath onset 2 weeks.  Notes that his shortness of breath is acutely worse with ambulation.  Has tried his inhaler today without relief of his symptoms.  Has associated productive cough (phlegm), wheezing.  Does not have a pulmonologist at this time.  Has an upcoming appoint with his primary care provider.  Denies fever, chest pain, nausea, vomiting. Not on dialysis at this time. Still able to urinate.   Patient also complains of right foot pain onset 2-3 weeks.  Notes his pain is localized to the ball and heel of the foot.  Denies his pain being worse at the start of the day.  Notes that it is worse throughout the day.  Has a history of gout.  Denies recent injury, trauma, fall.  Has been on prednisone within the last week.  Typically has gout flares in the feet, ankle, knee.  No meds tried prior to arrival.  Denies redness or gait problem at this time.   The history is provided by the patient. No language interpreter was used.       Home Medications Prior to Admission medications   Medication Sig Start Date End Date Taking? Authorizing Provider  albuterol (VENTOLIN HFA) 108 (90 Base) MCG/ACT inhaler Inhale 2 puffs into the lungs every 6 (six) hours as needed for wheezing or shortness of breath. 12/01/21   Barb Merino, MD  allopurinol (ZYLOPRIM) 100 MG tablet Take 1 tablet (100 mg total) by mouth daily. 01/02/22   Charlott Rakes, MD  amLODipine (NORVASC) 10 MG tablet Take 1 tablet by mouth once daily 05/18/22   Charlott Rakes, MD  atorvastatin (LIPITOR) 40 MG tablet Take 1 tablet (40 mg total) by mouth daily. 12/01/21   Barb Merino, MD   Blood Pressure Monitoring (3 SERIES BP MONITOR/UPPER ARM) DEVI 1 Units by Does not apply route 2 (two) times daily. 10/13/19   Elsie Stain, MD  clotrimazole-betamethasone (LOTRISONE) cream Apply to affected area 2 times daily. 05/12/21   Jaynee Eagles, PA-C  furosemide (LASIX) 40 MG tablet Take 1 tablet by mouth twice daily 03/30/22   Charlott Rakes, MD  ipratropium-albuterol (DUONEB) 0.5-2.5 (3) MG/3ML SOLN USE 1 AMPULE IN NEBULIZER EVERY 6 HOURS AS NEEDED 03/05/22   Charlott Rakes, MD  isosorbide mononitrate (IMDUR) 30 MG 24 hr tablet Take 1 tablet (30 mg total) by mouth daily. 12/01/21 12/31/21  Barb Merino, MD  labetalol (NORMODYNE) 300 MG tablet Take 1 tablet by mouth twice daily 04/27/22   Charlott Rakes, MD  levothyroxine (SYNTHROID) 100 MCG tablet Take 1 tablet (100 mcg total) by mouth daily. 12/01/21   Barb Merino, MD  Misc. Devices MISC Blood Pressure monitor. Dx: HTN 11/25/19   Charlott Rakes, MD  mometasone-formoterol (DULERA) 100-5 MCG/ACT AERO Inhale 2 puffs into the lungs 2 (two) times daily. 01/16/21   Charlott Rakes, MD  predniSONE (DELTASONE) 20 MG tablet Take 1 tablet (20 mg total) by mouth daily with breakfast. 03/28/22   Argentina Donovan, PA-C  triamcinolone (KENALOG) 0.1 % Apply 1 application topically 2 (two) times daily. 11/09/20   Charlott Rakes, MD      Allergies  Naproxen    Review of Systems   Review of Systems  Constitutional:  Negative for fever.  Respiratory:  Positive for cough (productive phelgm), shortness of breath and wheezing.   Cardiovascular:  Negative for chest pain.  Gastrointestinal:  Negative for nausea and vomiting.  Musculoskeletal:  Positive for arthralgias. Negative for gait problem and joint swelling.  Skin:  Negative for color change and wound.  All other systems reviewed and are negative.   Physical Exam Updated Vital Signs BP 112/83 (BP Location: Right Arm)   Pulse 79   Temp 98.2 F (36.8 C) (Oral)   Resp 15   SpO2 100%   Physical Exam Vitals and nursing note reviewed.  Constitutional:      General: He is not in acute distress.    Appearance: He is not diaphoretic.     Comments: Able to speak in clear complete sentences.  HENT:     Head: Normocephalic and atraumatic.     Mouth/Throat:     Pharynx: No oropharyngeal exudate.  Eyes:     General: No scleral icterus.    Conjunctiva/sclera: Conjunctivae normal.  Cardiovascular:     Rate and Rhythm: Normal rate and regular rhythm.     Pulses: Normal pulses.     Heart sounds: Normal heart sounds.  Pulmonary:     Effort: Pulmonary effort is normal. No respiratory distress.     Breath sounds: Decreased breath sounds and wheezing present.     Comments: Diminished air sounds noted throughout all lung fields.  Audible inspiratory wheezing noted on exam.  Mild expiratory wheezing noted throughout all lung fields. Abdominal:     General: Bowel sounds are normal.     Palpations: Abdomen is soft. There is no mass.     Tenderness: There is no abdominal tenderness. There is no guarding or rebound.  Musculoskeletal:        General: Normal range of motion.     Cervical back: Normal range of motion and neck supple.     Comments: Pedal pulse intact to right lower extremity.  No overlying erythema, swelling noted.  No tenderness to palpation noted to right Achilles or right calf.  Normal flexion and extension of right foot without difficulty.  Mild tenderness to palpation noted to ball and heel of right foot without overlying skin changes.  Able to ambulate without assistance or difficulty.  Skin:    General: Skin is warm and dry.  Neurological:     Mental Status: He is alert.  Psychiatric:        Behavior: Behavior normal.     ED Results / Procedures / Treatments   Labs (all labs ordered are listed, but only abnormal results are displayed) Labs Reviewed  BASIC METABOLIC PANEL - Abnormal; Notable for the following components:      Result Value   CO2 18 (*)     Glucose, Bld 101 (*)    BUN 84 (*)    Creatinine, Ser 7.93 (*)    GFR, Estimated 7 (*)    Anion gap 16 (*)    All other components within normal limits  CBC - Abnormal; Notable for the following components:   WBC 13.5 (*)    RBC 4.17 (*)    Hemoglobin 10.6 (*)    HCT 31.5 (*)    MCV 75.5 (*)    MCH 25.4 (*)    RDW 16.8 (*)    All other components within normal limits  BRAIN NATRIURETIC PEPTIDE - Abnormal; Notable  for the following components:   B Natriuretic Peptide 163.1 (*)    All other components within normal limits  I-STAT VENOUS BLOOD GAS, ED - Abnormal; Notable for the following components:   pO2, Ven 49 (*)    Acid-base deficit 6.0 (*)    HCT 33.0 (*)    Hemoglobin 11.2 (*)    All other components within normal limits  TROPONIN I (HIGH SENSITIVITY) - Abnormal; Notable for the following components:   Troponin I (High Sensitivity) 36 (*)    All other components within normal limits  TROPONIN I (HIGH SENSITIVITY) - Abnormal; Notable for the following components:   Troponin I (High Sensitivity) 40 (*)    All other components within normal limits  BLOOD GAS, VENOUS    EKG EKG Interpretation  Date/Time:  Wednesday May 23 2022 07:50:59 EDT Ventricular Rate:  76 PR Interval:  168 QRS Duration: 88 QT Interval:  400 QTC Calculation: 450 R Axis:   51 Text Interpretation: Normal sinus rhythm Possible Left atrial enlargement No significant change since last tracing When compared with ECG of 28-Nov-2021 20:45, PREVIOUS ECG IS PRESENT Confirmed by Kommor, Madison ([id number]) on 05/23/2022 9:34:42 AM  Radiology DG Chest 2 View  Result Date: 05/23/2022 CLINICAL DATA:  Shortness of breath today. History of congestive heart failure and chronic kidney disease. EXAM: CHEST - 2 VIEW COMPARISON:  11/28/2021 FINDINGS: Heart size is normal today. Mediastinal shadows are normal. Pulmonary vascularity is normal. The lungs are clear. No edema or effusions. No significant bone finding.  IMPRESSION: Normal examination today. No radiographic sign of congestive heart failure or pneumonia. Electronically Signed   By: Nelson Chimes M.D.   On: 05/23/2022 08:23    Procedures Procedures    Medications Ordered in ED Medications  ipratropium-albuterol (DUONEB) 0.5-2.5 (3) MG/3ML nebulizer solution 3 mL (3 mLs Nebulization Given 05/23/22 1104)  dexamethasone (DECADRON) injection 10 mg (10 mg Intravenous Given 05/23/22 1122)    ED Course/ Medical Decision Making/ A&P Clinical Course as of 05/23/22 1446  Wed May 23, 2022  1204 Patient reevaluated and noted to be resting comfortably on stretcher.  No audible wheezing noted.  Lung sounds clear to auscultation bilaterally following breathing treatment and steroids in the ED.  Discussed with patient remainder of labs and imaging findings.  Discussed plans for admission to the hospital due to worsening kidney function.  Answered all available questions.  Patient agreeable with admission plans at this time.  Patient appears safe for admission.   [SB]  1327 Consult with nephrologist, Dr. Candiss Norse who will evaluate the patient chart further. Dr. Candiss Norse agreeable with admission at this time.  [SB]  1347 Consult with hospitalist, who will evaluate the patient for admission.  [SB]    Clinical Course User Index [SB] Kerron Sedano A, PA-C                            Medical Decision Making Amount and/or Complexity of Data Reviewed Labs: ordered. Radiology: ordered.  Risk Prescription drug management. Decision regarding hospitalization.   Patient presents to the ED complaining of shortness of breath onset 2 weeks. Vital signs stable, pt afebrile, not tachycardic or hypoxic. Overall well appearing on exam, able to speak with clear complete sentences.  Doesn't wear oxygen at baseline.  On exam patient with diminished air sounds noted throughout all lung fields.  Audible inspiratory wheezing noted on exam.  Mild expiratory wheezing noted throughout all  lung fields. Pedal  pulse intact to right lower extremity.  No overlying erythema, swelling noted.  No tenderness to palpation noted to right Achilles or right calf.  Normal flexion and extension of right foot without difficulty.  Mild tenderness to palpation noted to ball and heel of right foot without overlying skin changes.  Able to ambulate without assistance or difficulty. No acute cardiovascular findings. No pitting edema noted bilaterally. Recent echocardiogram on 11/29/2021 showed LV EF of 60-65%. Differential diagnosis includes CHF exacerbation, ACS, PTX, PNA.   Co morbidities that complicate the patient evaluation: CHF COPD HTN Gout   Labs:  I ordered, and personally interpreted labs.  The pertinent results include:   Initial troponin elevated at 36, delta troponin at 40 and stable.  Patient asymptomatic without chest pain, troponin is likely elevated in the setting of worsening renal function, COPD, and CHF. Blood gas with slightly elevated pO2 CBC with leukocytosis at 13.5 (likely elevated in the setting of recent prednisone use), hemoglobin at 10.6 however stable. BMP with BUN elevated 84, creatinine at 7.93, worsening renal function.    Imaging: I ordered imaging studies including chest x-ray, right foot x-ray I independently visualized and interpreted imaging which showed: Right foot x-ray without acute findings for fracture or dislocation.  Chest x-ray overall unremarkable without findings for CHF or pneumonia. I agree with the radiologist interpretation  Medications:  I ordered medication including DuoNeb, Decadron for symptom management Reevaluation of the patient after these medicines and interventions, I reevaluated the patient and found that they have improved I have reviewed the patients home medicines and have made adjustments as needed  Consultations: I requested consultation with the Hospitalist with Internal Medicine Teaching Service and discussed lab and imaging  findings as well as pertinent plan - they recommend: will come evaluate the patient for admission.   Disposition: Presentation suspicious for worsening renal function, likely AKI in the setting of CKD. Doubt ACS, pt asymptomatic at this time. Doubt PE, patient without tachycardia on exam, no acute findings on EKG. Doubt PNA at this time. After consideration of the diagnostic results and the patients response to treatment, I feel that the patient would benefit from Admission to the hospital. Discussed with patient plans for admission. Answered all available questions. Pt appears safe for admission at this time.    This chart was dictated using voice recognition software, Dragon. Despite the best efforts of this provider to proofread and correct errors, errors may still occur which can change documentation meaning.  Final Clinical Impression(s) / ED Diagnoses Final diagnoses:  SOB (shortness of breath)  Right foot pain    Rx / DC Orders ED Discharge Orders     None         Jaira Canady A, PA-C 05/23/22 1454    Jeanell Sparrow, DO 05/25/22 0451

## 2022-05-24 ENCOUNTER — Other Ambulatory Visit (HOSPITAL_COMMUNITY): Payer: Self-pay

## 2022-05-24 DIAGNOSIS — N185 Chronic kidney disease, stage 5: Secondary | ICD-10-CM | POA: Diagnosis not present

## 2022-05-24 DIAGNOSIS — J9601 Acute respiratory failure with hypoxia: Secondary | ICD-10-CM | POA: Diagnosis not present

## 2022-05-24 DIAGNOSIS — I509 Heart failure, unspecified: Secondary | ICD-10-CM | POA: Diagnosis not present

## 2022-05-24 DIAGNOSIS — D631 Anemia in chronic kidney disease: Secondary | ICD-10-CM | POA: Diagnosis not present

## 2022-05-24 DIAGNOSIS — R0602 Shortness of breath: Secondary | ICD-10-CM | POA: Diagnosis not present

## 2022-05-24 DIAGNOSIS — J441 Chronic obstructive pulmonary disease with (acute) exacerbation: Secondary | ICD-10-CM | POA: Diagnosis not present

## 2022-05-24 DIAGNOSIS — Z87891 Personal history of nicotine dependence: Secondary | ICD-10-CM | POA: Diagnosis not present

## 2022-05-24 DIAGNOSIS — E872 Acidosis, unspecified: Secondary | ICD-10-CM | POA: Diagnosis not present

## 2022-05-24 DIAGNOSIS — I132 Hypertensive heart and chronic kidney disease with heart failure and with stage 5 chronic kidney disease, or end stage renal disease: Secondary | ICD-10-CM | POA: Diagnosis not present

## 2022-05-24 LAB — CBC
HCT: 30.9 % — ABNORMAL LOW (ref 39.0–52.0)
Hemoglobin: 10.6 g/dL — ABNORMAL LOW (ref 13.0–17.0)
MCH: 25.5 pg — ABNORMAL LOW (ref 26.0–34.0)
MCHC: 34.3 g/dL (ref 30.0–36.0)
MCV: 74.5 fL — ABNORMAL LOW (ref 80.0–100.0)
Platelets: 169 10*3/uL (ref 150–400)
RBC: 4.15 MIL/uL — ABNORMAL LOW (ref 4.22–5.81)
RDW: 16.4 % — ABNORMAL HIGH (ref 11.5–15.5)
WBC: 15.3 10*3/uL — ABNORMAL HIGH (ref 4.0–10.5)
nRBC: 0 % (ref 0.0–0.2)

## 2022-05-24 LAB — RESPIRATORY PANEL BY PCR

## 2022-05-24 LAB — RENAL FUNCTION PANEL
Albumin: 3.2 g/dL — ABNORMAL LOW (ref 3.5–5.0)
Anion gap: 15 (ref 5–15)
BUN: 91 mg/dL — ABNORMAL HIGH (ref 6–20)
CO2: 17 mmol/L — ABNORMAL LOW (ref 22–32)
Calcium: 8.8 mg/dL — ABNORMAL LOW (ref 8.9–10.3)
Chloride: 102 mmol/L (ref 98–111)
Creatinine, Ser: 8.29 mg/dL — ABNORMAL HIGH (ref 0.61–1.24)
GFR, Estimated: 7 mL/min — ABNORMAL LOW (ref >60)
Glucose, Bld: 128 mg/dL — ABNORMAL HIGH (ref 70–99)
Phosphorus: 3.2 mg/dL (ref 2.5–4.6)
Potassium: 4.3 mmol/L (ref 3.5–5.1)
Sodium: 134 mmol/L — ABNORMAL LOW (ref 135–145)

## 2022-05-24 LAB — LIPID PANEL
Cholesterol: 123 mg/dL (ref 0–200)
HDL: 65 mg/dL (ref >40)
LDL Cholesterol: 49 mg/dL (ref 0–99)
Total CHOL/HDL Ratio: 1.9 RATIO
Triglycerides: 44 mg/dL (ref <150)
VLDL: 9 mg/dL (ref 0–40)

## 2022-05-24 LAB — HEMOGLOBIN A1C
Hgb A1c MFr Bld: 5.3 % (ref 4.8–5.6)
Mean Plasma Glucose: 105.41 mg/dL

## 2022-05-24 LAB — TSH: TSH: 0.409 u[IU]/mL (ref 0.350–4.500)

## 2022-05-24 MED ORDER — ISOSORBIDE MONONITRATE ER 30 MG PO TB24
30.0000 mg | ORAL_TABLET | Freq: Every day | ORAL | 2 refills | Status: DC
  Filled 2022-05-24: qty 30, 30d supply, fill #0

## 2022-05-24 MED ORDER — PREDNISONE 20 MG PO TABS
40.0000 mg | ORAL_TABLET | Freq: Every day | ORAL | 0 refills | Status: DC
  Filled 2022-05-24: qty 6, 3d supply, fill #0

## 2022-05-24 NOTE — Progress Notes (Signed)
SATURATION QUALIFICATIONS: (This note is used to comply with regulatory documentation for home oxygen)  Patient Saturations on Room Air at Rest = 100%  Patient Saturations on Room Air while Ambulating = 97%  Patient Saturations on n/a Liters of oxygen while Ambulating = 97%  Please briefly explain why patient needs home oxygen:

## 2022-05-24 NOTE — Progress Notes (Signed)
[facility] Progress Note    Assessment/ Plan:   CKD5 -stable CKD5, not exhibiting uremic symptoms or volume overload. Very close to needing to start dialysis which can be done as an outpatient provided he is stable here and no indications arise in the interim (which seems to be the case today). Follows w/ Dr. Joylene Grapes, last Cr in 03/2022 was 7.6 with a eGFR of 8. Needs to have close/frequent follow up as an outpatient, advised him to call the office once he is discharged to get this set up -does not have an indication to start HD at this time -Continue to monitor daily Cr, Dose meds for GFR<15 -Maintain MAP>65 for optimal renal perfusion.  -Avoid nephrotoxic medications including NSAIDs and iodinated intravenous contrast exposure unless the latter is absolutely indicated.  Preferred narcotic agents for pain control are hydromorphone, fentanyl, and methadone. Morphine should not be used. Avoid Baclofen and avoid oral sodium phosphate and magnesium citrate based laxatives / bowel preps. Continue strict Input and Output monitoring. Will monitor the patient closely with you and intervene or adjust therapy as indicated by changes in clinical status/labs    Metabolic acidosis, +anion gap -likely secondary to CKD5. Now on nahco3 1314m TID   Hypertension: -resume home meds   AHRF, COPD -likely secondary to acute on chronic COPD exacerbation -CXR without pulm edema -COPD mgmt per primary service   Rt foot pain -xray neg, per primary service   Anemia due to CKD: -Transfuse for Hgb<7 g/dL -hgb acceptable for CKD   CKD MBD -PO4 WNL, renal diet    Subjective:   No acute events. Patient reports that he is feeling better esp in regards to his breathing. He reports that he might be discharged later today.   Objective:   BP 135/88   Pulse 69   Temp 97.9 F (36.6 C) (Oral)   Resp 14   Ht _0  (1.854 m)   Wt 83.6 kg   SpO2 100%   BMI 24.30 kg/m  No intake or output data  in the 24 hours ending 05/24/22 1142 Weight change:   Physical Exam: Gen: NAD CVS: RRR Resp: CTA B/L Abd: soft Ext: no edema Neuro: awake, alert, no tremors Dialysis access: LUE AVF +b/t  Imaging: DG Knee 1-2 Views Left  Result Date: 05/23/2022 CLINICAL DATA:  Left lower extremity pain with activity EXAM: LEFT KNEE - 1-2 VIEW COMPARISON:  12/13/2012 FINDINGS: Frontal and lateral views of the left knee are obtained. No fracture, subluxation, or dislocation. Progressive 3 compartmental osteoarthritis, with moderate joint space narrowing and osteophyte formation in the medial and patellofemoral compartments. Moderate joint effusion. Soft tissues are otherwise unremarkable. IMPRESSION: 1. Moderate progressive 3 compartmental osteoarthritis greatest in the medial and patellofemoral compartments. 2. Moderate joint effusion. 3. No acute fracture. Electronically Signed   By: MRanda NgoM.D.   On: 05/23/2022 17:17   DG Foot Complete Right  Result Date: 05/23/2022 CLINICAL DATA:  Right foot pain. EXAM: RIGHT FOOT COMPLETE - 3+ VIEW COMPARISON:  None Available. FINDINGS: There is no evidence of fracture or dislocation. There is no evidence of arthropathy or other focal bone abnormality. Soft tissues are unremarkable. IMPRESSION: Negative. Electronically Signed   By: JMarijo ConceptionM.D.   On: 05/23/2022 10:28   DG Chest 2 View  Result Date: 05/23/2022 CLINICAL DATA:  Shortness of breath today. History of congestive heart failure and chronic kidney disease. EXAM: CHEST - 2 VIEW COMPARISON:  11/28/2021 FINDINGS: Heart size is normal  today. Mediastinal shadows are normal. Pulmonary vascularity is normal. The lungs are clear. No edema or effusions. No significant bone finding. IMPRESSION: Normal examination today. No radiographic sign of congestive heart failure or pneumonia. Electronically Signed   By: Nelson Chimes M.D.   On: 05/23/2022 08:23    Labs: BMET Recent Labs  Lab 05/23/22 0759  05/23/22 1045 05/24/22 0341  NA 138 137 134*  K 3.8 3.9 4.3  CL 104  --  102  CO2 18*  --  17*  GLUCOSE 101*  --  128*  BUN 84*  --  91*  CREATININE 7.93*  --  8.29*  CALCIUM 8.9  --  8.8*  PHOS  --   --  3.2   CBC Recent Labs  Lab 05/23/22 0759 05/23/22 1045 05/24/22 0341  WBC 13.5*  --  15.3*  HGB 10.6* 11.2* 10.6*  HCT 31.5* 33.0* 30.9*  MCV 75.5*  --  74.5*  PLT 174  --  169    Medications:     allopurinol  100 mg Oral Daily   amLODipine  10 mg Oral Daily   atorvastatin  40 mg Oral Daily   calcitRIOL  0.5 mcg Oral Daily   furosemide  40 mg Oral BID   heparin  5,000 Units Subcutaneous Q8H   labetalol  300 mg Oral BID   levothyroxine  100 mcg Oral Daily   mometasone-formoterol  2 puff Inhalation BID   predniSONE  40 mg Oral Q breakfast   sodium bicarbonate  1,300 mg Oral TID      Gean Quint, MD [facility] 05/24/2022, 11:42 AM

## 2022-05-24 NOTE — Discharge Summary (Signed)
Name: Johnny [name] MRN: [id number] DOB: 1961-01-01 61 y.o. PCP: Charlott Rakes, MD  Date of Admission: 05/23/2022  7:46 AM Date of Discharge: 05/24/2022 5:08 PM Attending Physician: No att. providers found  Discharge Diagnosis: Principal Problem:   COPD exacerbation ([place]) Active Problems:   OSA (obstructive sleep apnea)   CKD (chronic kidney disease) stage 5, GFR less than 15 ml/min ([place])   Essential hypertension   Gout   Discharge Medications: Allergies as of 05/24/2022       Reactions   Naproxen Other (See Comments)   Unknown        Medication List     STOP taking these medications    ipratropium-albuterol 0.5-2.5 (3) MG/3ML Soln Commonly known as: DUONEB       TAKE these medications    3 Series BP Monitor/Upper Arm Devi 1 Units by Does not apply route 2 (two) times daily.   allopurinol 100 MG tablet Commonly known as: ZYLOPRIM Take 1 tablet (100 mg total) by mouth daily.   amLODipine 10 MG tablet Commonly known as: NORVASC Take 1 tablet by mouth once daily   atorvastatin 40 MG tablet Commonly known as: LIPITOR Take 1 tablet (40 mg total) by mouth daily.   calcitRIOL 0.5 MCG capsule Commonly known as: ROCALTROL Take 0.5 mcg by mouth daily.   clotrimazole-betamethasone cream Commonly known as: LOTRISONE Apply to affected area 2 times daily.   Dulera 100-5 MCG/ACT Aero Generic drug: mometasone-formoterol Inhale 2 puffs into the lungs 2 (two) times daily.   furosemide 40 MG tablet Commonly known as: LASIX Take 1 tablet by mouth twice daily   isosorbide mononitrate 30 MG 24 hr tablet Commonly known as: IMDUR Take 1 tablet (30 mg total) by mouth daily.   labetalol 300 MG tablet Commonly known as: NORMODYNE Take 1 tablet by mouth twice daily   levothyroxine 100 MCG tablet Commonly known as: SYNTHROID Take 1 tablet (100 mcg total) by mouth daily.   Misc. Devices Misc Blood Pressure monitor. Dx: HTN   predniSONE 20 MG tablet Commonly  known as: DELTASONE Take 2 tablets (40 mg total) by mouth daily with breakfast for 3 doses. Start taking on: May 25, 2022 What changed: how much to take   triamcinolone cream 0.1 % Commonly known as: KENALOG Apply 1 application topically 2 (two) times daily.   Ventolin HFA 108 (90 Base) MCG/ACT inhaler Generic drug: albuterol Inhale 2 puffs into the lungs every 6 (six) hours as needed for wheezing or shortness of breath.        Disposition and follow-up:   [name] is a 61 y.o. year old male admitted to [facility] for COPD exacerbation and foot pain. They were discharged from [facility] on hospital day 0 in Good condition.  At the hospital follow up visit please address:  COPD exacerbation - Complete 5-day course of prednisone 40 mg on 05/27/2022. - Continue Dulera 2 puffs 2 times a day - Continue albuterol as needed for wheezing and shortness of breath  Foot pain Metatarsalgia versus Morton's neuroma versus plantar fasciitis.  CKD stage V No indication for dialysis at this time.  Seen by nephrology while hospitalized. - Follow-up with nephrology as outpatient  Labs / imaging needed at time of follow-up: - Renal function panel - Consider PFTs  Follow-up Appointments:  Follow-up Information     Charlott Rakes, MD. Go in 4 day(s).   Specialty: Family Medicine Contact information: [place]  [address] [address] [phone]                 Hospital Course by problem list:  COPD exacerbation Patient presented with couple of weeks of worsening shortness of breath and wheezing.  He has been using both albuterol and Dulera as a rescue inhaler at home.  He does not identify any recent illnesses or triggers for the COPD exacerbation.  Chest x-ray was normal without focal opacities or signs of pulmonary edema or pleural effusion.  Infectious work-up, including respiratory pathogen panel was negative.   Patient euvolemic on exam and not thought to be in heart failure exacerbation.  In the emergency department dexamethasone guaifenesin were administered.  Upon admission patient was started on 4-day course of prednisone to complete 5 days of systemic corticosteroids.  Patient COPD exacerbation was mild and we opted to forego treatment with azithromycin.  His condition remained stable until discharge on the following day.  He was counseled to use his Dulera daily as it is most effective when it is used as maintenance therapy rather than a rescue inhaler.  Foot pain X-rays were negative for fracture or dislocation.  Pain is chronic and worse after spending a long time on his feet.  Differential diagnosis includes plantar fasciitis, metatarsalgia, Morton's neuroma.  CKD stage V Patient with GFR of 8.  BUN of 91.  No signs of uremic encephalopathy.  Phosphorus and potassium were normal.  Patient still makes urine.  No indication for dialysis while hospitalized.  Discharge Exam:  Subjective: Patient feels well.  In good spirits.   Blood pressure 135/88, pulse 69, temperature 97.9 F (36.6 C), temperature source Oral, resp. rate 14, height '6\' 1"'$  (1.854 m), weight 83.6 kg, SpO2 100 %. General: Robust appearing male walking around hospital room. Respiratory: Normal work of breathing noted. Extremities: Moves all extremities. Skin: Warm and dry. Neuro: Alert and oriented x4.  Normal gait. Psych: Pleasant and appropriate mood and affect.  Pertinent studies and procedures:   CXR: Normal exam Right foot x-ray: No fracture or dislocation. Left knee x-ray: Moderate joint effusion without fracture or dislocation.   Latest Reference Range & Units 05/24/22 03:41  Total CHOL/HDL Ratio RATIO 1.9  Cholesterol 0 - 200 mg/dL 123  HDL Cholesterol >40 mg/dL 65  LDL (calc) 0 - 99 mg/dL 49  Triglycerides <150 mg/dL 44  VLDL 0 - 40 mg/dL 9    Latest Reference Range & Units 05/24/22 03:41  WBC 4.0 - 10.5 K/uL  15.3 (H)  RBC 4.22 - 5.81 MIL/uL 4.15 (L)  Hemoglobin 13.0 - 17.0 g/dL 10.6 (L)  HCT 39.0 - 52.0 % 30.9 (L)  MCV 80.0 - 100.0 fL 74.5 (L)  MCH 26.0 - 34.0 pg 25.5 (L)  MCHC 30.0 - 36.0 g/dL 34.3  RDW 11.5 - 15.5 % 16.4 (H)  Platelets 150 - 400 K/uL 169  nRBC 0.0 - 0.2 % 0.0  (H): Data is abnormally high (L): Data is abnormally low   Latest Reference Range & Units 05/24/22 03:41  Sodium 135 - 145 mmol/L 134 (L)  Potassium 3.5 - 5.1 mmol/L 4.3  Chloride 98 - 111 mmol/L 102  CO2 22 - 32 mmol/L 17 (L)  Glucose 70 - 99 mg/dL 128 (H)  Mean Plasma Glucose mg/dL 105.41  BUN 6 - 20 mg/dL 91 (H)  Creatinine 0.61 - 1.24 mg/dL 8.29 (H)  Calcium 8.9 - 10.3 mg/dL 8.8 (L)  Anion gap 5 - 15  15  Phosphorus 2.5 -  4.6 mg/dL 3.2  Albumin 3.5 - 5.0 g/dL 3.2 (L)  (L): Data is abnormally low (H): Data is abnormally high   Discharge Instructions      [name]  You were admitted for shortness of breath and treated for a mild COPD exacerbation.  You were treated with a short course of steroids, which you will continue until 05/27/2022.  We are discharging you home now that you are doing better.  COPD is a progressive lung disease.  Medicine can help slow the progression of COPD.  To help assist you on your road to recovery, I have written the following recommendations:   Please take your medications as prescribed: - Start prednisone 2 tablets (40 mg total) daily until 05/27/2022 - Restart mometasone formoterol (Dulera) and inhale 2 puffs into the lungs twice per day - Continue taking albuterol and inhale 2 puffs into the lungs every 6 hours as needed for wheezing or shortness of breath  - Continue taking amlodipine, labetalol, and Imdur for high blood pressure - Continue taking furosemide 40 mg twice daily - Continue taking allopurinol daily to prevent gout flare - Continue taking levothyroxine for hypothyroidism  For your chronic foot pain, we recommend working on getting some  insoles/orthotics to help cushion your feet and support your arch, especially because you have flat feet.  We have provided you some information on paper.  It is unclear exactly what condition is causing your pain, but you xray did not show any injuries and the pain likely has to do with your flat foot and poor shoe support of your feet when walking.  Please talk to your primary care physician for more information if needed.  We will also recommend considering outpatient physical therapy to your primary care physician.  Please follow-up with your primary care physician as soon as possible after you are discharged from the hospital.  You have an appointment with Dr. Margarita Rana on Monday, May 28, 2022.  Please call your doctor or return to the hospital if you experience signs of respiratory failure due to COPD, including, but not limited to: New or worsening shortness of breath, chest pain, heart palpitations, confusion, excessive sleepiness or fatigue, fainting.  It was a privilege to be a part of your hospital care team, and I hope you feel better as a result of your stay.  All the best,   Signed: Nani Gasser MD 05/24/2022, 5:08 PM   Pager: [phone]

## 2022-05-24 NOTE — Discharge Instructions (Addendum)
Johnny [name]  You were admitted for shortness of breath and treated for a mild COPD exacerbation.  You were treated with a short course of steroids, which you will continue until 05/27/2022.  We are discharging you home now that you are doing better.  COPD is a progressive lung disease.  Medicine can help slow the progression of COPD.  To help assist you on your road to recovery, I have written the following recommendations:   Please take your medications as prescribed: - Start prednisone 2 tablets (40 mg total) daily until 05/27/2022 - Restart mometasone formoterol (Dulera) and inhale 2 puffs into the lungs twice per day - Continue taking albuterol and inhale 2 puffs into the lungs every 6 hours as needed for wheezing or shortness of breath  - Continue taking amlodipine, labetalol, and Imdur for high blood pressure - Continue taking furosemide 40 mg twice daily - Continue taking allopurinol daily to prevent gout flare - Continue taking levothyroxine for hypothyroidism  Please follow-up with your primary care physician as soon as possible after you are discharged from the hospital.  You have an appointment with Dr. Margarita Rana on Monday, May 28, 2022.  Please call your doctor or return to the hospital if you experience signs of respiratory failure due to COPD, including, but not limited to: New or worsening shortness of breath, chest pain, heart palpitations, confusion, excessive sleepiness or fatigue, fainting.  It was a privilege to be a part of your hospital care team, and I hope you feel better as a result of your stay.  All the best,

## 2022-05-25 ENCOUNTER — Other Ambulatory Visit: Payer: Self-pay | Admitting: Family Medicine

## 2022-05-25 DIAGNOSIS — E7800 Pure hypercholesterolemia, unspecified: Secondary | ICD-10-CM

## 2022-05-28 ENCOUNTER — Encounter: Payer: Self-pay | Admitting: Family Medicine

## 2022-05-28 ENCOUNTER — Ambulatory Visit: Payer: Medicaid Other | Attending: Family Medicine | Admitting: Family Medicine

## 2022-05-28 VITALS — BP 120/68 | HR 69 | Temp 97.9°F | Ht 73.0 in | Wt 191.2 lb

## 2022-05-28 DIAGNOSIS — M1A00X0 Idiopathic chronic gout, unspecified site, without tophus (tophi): Secondary | ICD-10-CM

## 2022-05-28 DIAGNOSIS — I10 Essential (primary) hypertension: Secondary | ICD-10-CM | POA: Diagnosis not present

## 2022-05-28 DIAGNOSIS — E7800 Pure hypercholesterolemia, unspecified: Secondary | ICD-10-CM | POA: Diagnosis not present

## 2022-05-28 DIAGNOSIS — J432 Centrilobular emphysema: Secondary | ICD-10-CM | POA: Diagnosis not present

## 2022-05-28 DIAGNOSIS — M79671 Pain in right foot: Secondary | ICD-10-CM

## 2022-05-28 DIAGNOSIS — E039 Hypothyroidism, unspecified: Secondary | ICD-10-CM

## 2022-05-28 MED ORDER — LEVOTHYROXINE SODIUM 100 MCG PO TABS
100.0000 ug | ORAL_TABLET | Freq: Every day | ORAL | 6 refills | Status: DC
Start: 2022-05-28 — End: 2022-11-07

## 2022-05-28 MED ORDER — ATORVASTATIN CALCIUM 40 MG PO TABS: 40.0000 mg | ORAL_TABLET | Freq: Every day | ORAL | 6 refills | Status: DC

## 2022-05-28 MED ORDER — AMLODIPINE BESYLATE 10 MG PO TABS: 10.0000 mg | ORAL_TABLET | Freq: Every day | ORAL | 6 refills | Status: DC

## 2022-05-28 MED ORDER — ALLOPURINOL 100 MG PO TABS: 100.0000 mg | ORAL_TABLET | Freq: Every day | ORAL | 6 refills | Status: DC

## 2022-05-28 MED ORDER — LABETALOL HCL 300 MG PO TABS: 300.0000 mg | ORAL_TABLET | Freq: Two times a day (BID) | ORAL | 6 refills | Status: DC

## 2022-05-28 MED ORDER — DULERA 100-5 MCG/ACT IN AERO: 2.0000 | INHALATION_SPRAY | Freq: Two times a day (BID) | RESPIRATORY_TRACT | 6 refills | Status: DC

## 2022-05-28 MED ORDER — PREDNISONE 20 MG PO TABS: 40.0000 mg | ORAL_TABLET | Freq: Every day | ORAL | 0 refills | Status: DC

## 2022-05-28 NOTE — Progress Notes (Signed)
Subjective:  Patient ID: Johnny [name], male    DOB: 25-Mar-1961  Age: 61 y.o. MRN: [id number]  CC: Hospitalization Follow-up   HPI Johnny [name] is a 61 y.o. year old male with a history of COPD, Gout, stage V chronic kidney disease, hypertension, sleep apnea, HFrEF (EF 60 -65% from 11/2021) He was hospitalized 05/23/2022 through 05/24/2022 for COPD exacerbation.  Chest x-ray revealed no evidence of CHF or pneumonia. Discharged on 5-day course of prednisone.  Interval History: He has an upcoming appointment with the Nephrologist He was taken off Colchicine due to his CKD remains on allopurinol and he is requesting a refill of prednisone in the event that he has a gout flare.  He has pain in his right heel and sometimes in the anterior aspect of his right foot and x-rays performed during hospitalization was negative for fracture but questioned possibility of plantar fasciitis versus Morton's neuroma.  Denies presence of pedal edema or dyspnea.  He has completed his course of prednisone and his COPD is stable.  Doing well on his antihypertensive. Past Medical History:  Diagnosis Date   Adenomatous colon polyp    tubular   CHF (congestive heart failure) (HCC)    Chronic kidney disease    COPD (chronic obstructive pulmonary disease) (HCC)    Diverticulosis    External otitis of right ear 11/10/2018   Hepatitis C    Hypertension    OSA (obstructive sleep apnea) 11/03/2015   Strep throat 01/2021    Past Surgical History:  Procedure Laterality Date   AV FISTULA PLACEMENT  09/12/2009   Left arm AVF    Family History  Problem Relation Age of Onset   Hypertension Mother    Diabetes Mother    Colon cancer Neg Hx     Social History   Socioeconomic History   Marital status: Divorced    Spouse name: Not on file   Number of children: Not on file   Years of education: 14   Highest education level: Not on file  Occupational History   Occupation: Electronics engineer  Tobacco Use    Smoking status: Former    Packs/day: 0.10    Types: Cigarettes    Quit date: 04/02/2011    Years since quitting: 11.1   Smokeless tobacco: Never   Tobacco comments:    trying to quit  Vaping Use   Vaping Use: Never used  Substance and Sexual Activity   Alcohol use: Never    Alcohol/week: 0.0 standard drinks of alcohol   Drug use: No   Sexual activity: Not Currently  Other Topics Concern   Not on file  Social History Narrative   Lives at home alone.   Right-handed.    Occasional caffeine use.   Social Determinants of Health   Financial Resource Strain: Not on file  Food Insecurity: Not on file  Transportation Needs: Not on file  Physical Activity: Not on file  Stress: Not on file  Social Connections: Not on file    Allergies  Allergen Reactions   Naproxen Other (See Comments)    Unknown    Outpatient Medications Prior to Visit  Medication Sig Dispense Refill   albuterol (VENTOLIN HFA) 108 (90 Base) MCG/ACT inhaler Inhale 2 puffs into the lungs every 6 (six) hours as needed for wheezing or shortness of breath. 18 g 2   Blood Pressure Monitoring (3 SERIES BP MONITOR/UPPER ARM) DEVI 1 Units by Does not apply route 2 (two) times daily. 1 each  0   calcitRIOL (ROCALTROL) 0.5 MCG capsule Take 0.5 mcg by mouth daily.     furosemide (LASIX) 40 MG tablet Take 1 tablet by mouth twice daily 60 tablet 1   isosorbide mononitrate (IMDUR) 30 MG 24 hr tablet Take 1 tablet (30 mg total) by mouth daily. 30 tablet 2   Misc. Devices MISC Blood Pressure monitor. Dx: HTN 1 each 0   triamcinolone (KENALOG) 0.1 % Apply 1 application topically 2 (two) times daily. 45 g 1   allopurinol (ZYLOPRIM) 100 MG tablet Take 1 tablet (100 mg total) by mouth daily. 30 tablet 3   amLODipine (NORVASC) 10 MG tablet Take 1 tablet by mouth once daily 30 tablet 0   atorvastatin (LIPITOR) 40 MG tablet Take 1 tablet by mouth once daily 30 tablet 0   labetalol (NORMODYNE) 300 MG tablet Take 1 tablet by mouth twice  daily 60 tablet 2   levothyroxine (SYNTHROID) 100 MCG tablet Take 1 tablet (100 mcg total) by mouth daily. 30 tablet 0   mometasone-formoterol (DULERA) 100-5 MCG/ACT AERO Inhale 2 puffs into the lungs 2 (two) times daily. 13 g 6   clotrimazole-betamethasone (LOTRISONE) cream Apply to affected area 2 times daily. (Patient not taking: Reported on 05/23/2022) 30 g 0   predniSONE (DELTASONE) 20 MG tablet Take 2 tablets (40 mg total) by mouth daily with breakfast for 3 doses. (Patient not taking: Reported on 05/28/2022) 6 tablet 0   No facility-administered medications prior to visit.     ROS Review of Systems  Constitutional:  Negative for activity change and appetite change.  HENT:  Negative for sinus pressure and sore throat.   Respiratory:  Negative for chest tightness, shortness of breath and wheezing.   Cardiovascular:  Negative for chest pain and palpitations.  Gastrointestinal:  Negative for abdominal distention, abdominal pain and constipation.  Genitourinary: Negative.   Musculoskeletal:        See HPI  Psychiatric/Behavioral:  Negative for behavioral problems and dysphoric mood.     Objective:  BP 120/68   Pulse 69   Temp 97.9 F (36.6 C) (Oral)   Ht '6\' 1"'$  (1.854 m)   Wt 191 lb 3.2 oz (86.7 kg)   SpO2 99%   BMI 25.23 kg/m      05/28/2022    2:16 PM 05/24/2022    7:45 AM 05/23/2022    9:48 PM  BP/Weight  Systolic BP 416 606   301   Diastolic BP 68 88   88   Wt. (Lbs) 191.2  184.2  BMI 25.23 kg/m2  24.3 kg/m2      Physical Exam Constitutional:      Appearance: He is well-developed.  Cardiovascular:     Rate and Rhythm: Normal rate.     Heart sounds: Normal heart sounds. No murmur heard. Pulmonary:     Effort: Pulmonary effort is normal.     Breath sounds: Normal breath sounds. No wheezing or rales.  Chest:     Chest wall: No tenderness.  Abdominal:     General: Bowel sounds are normal. There is no distension.     Palpations: Abdomen is soft. There is no  mass.     Tenderness: There is no abdominal tenderness.  Musculoskeletal:        General: Normal range of motion.     Right lower leg: No edema.     Left lower leg: No edema.  Neurological:     Mental Status: He is alert and oriented to person,  place, and time.  Psychiatric:        Mood and Affect: Mood normal.        Latest Ref Rng & Units 05/24/2022    3:41 AM 05/23/2022   10:45 AM 05/23/2022    7:59 AM  CMP  Glucose 70 - 99 mg/dL 128   101   BUN 6 - 20 mg/dL 91   84   Creatinine 0.61 - 1.24 mg/dL 8.29   7.93   Sodium 135 - 145 mmol/L 134  137  138   Potassium 3.5 - 5.1 mmol/L 4.3  3.9  3.8   Chloride 98 - 111 mmol/L 102   104   CO2 22 - 32 mmol/L 17   18   Calcium 8.9 - 10.3 mg/dL 8.8   8.9     Lipid Panel     Component Value Date/Time   CHOL 123 05/24/2022 0341   CHOL 214 (H) 11/10/2018 1104   TRIG 44 05/24/2022 0341   HDL 65 05/24/2022 0341   HDL 96 11/10/2018 1104   CHOLHDL 1.9 05/24/2022 0341   VLDL 9 05/24/2022 0341   LDLCALC 49 05/24/2022 0341   LDLCALC 102 (H) 11/10/2018 1104    CBC    Component Value Date/Time   WBC 15.3 (H) 05/24/2022 0341   RBC 4.15 (L) 05/24/2022 0341   HGB 10.6 (L) 05/24/2022 0341   HGB 14.2 11/08/2020 1654   HCT 30.9 (L) 05/24/2022 0341   HCT 41.7 11/08/2020 1654   PLT 169 05/24/2022 0341   PLT 183 11/08/2020 1654   MCV 74.5 (L) 05/24/2022 0341   MCV 77 (L) 11/08/2020 1654   MCH 25.5 (L) 05/24/2022 0341   MCHC 34.3 05/24/2022 0341   RDW 16.4 (H) 05/24/2022 0341   RDW 15.8 (H) 11/08/2020 1654   LYMPHSABS 2.2 11/28/2021 2104   LYMPHSABS 2.5 11/08/2020 1654   MONOABS 0.6 11/28/2021 2104   EOSABS 0.4 11/28/2021 2104   EOSABS 0.4 11/08/2020 1654   BASOSABS 0.1 11/28/2021 2104   BASOSABS 0.1 11/08/2020 1654    Lab Results  Component Value Date   HGBA1C 5.3 05/24/2022    Lab Results  Component Value Date   TSH 0.409 05/24/2022     Assessment & Plan:  1. Idiopathic chronic gout without tophus, unspecified  site Stable with no flares He is on a chronic diuretic which puts him at risk of future flares Work on low purine eating plan - allopurinol (ZYLOPRIM) 100 MG tablet; Take 1 tablet (100 mg total) by mouth daily.  Dispense: 30 tablet; Refill: 6 - predniSONE (DELTASONE) 20 MG tablet; Take 2 tablets (40 mg total) by mouth daily with breakfast for 3 doses.  Dispense: 6 tablet; Refill: 0  2. Essential hypertension Controlled Counseled on blood pressure goal of less than 130/80, low-sodium, DASH diet, medication compliance, 150 minutes of moderate intensity exercise per week. Discussed medication compliance, adverse effects. - amLODipine (NORVASC) 10 MG tablet; Take 1 tablet (10 mg total) by mouth daily.  Dispense: 30 tablet; Refill: 6 - labetalol (NORMODYNE) 300 MG tablet; Take 1 tablet (300 mg total) by mouth 2 (two) times daily.  Dispense: 60 tablet; Refill: 6  3. Pure hypercholesterolemia Controlled Low-cholesterol diet - atorvastatin (LIPITOR) 40 MG tablet; Take 1 tablet (40 mg total) by mouth daily.  Dispense: 30 tablet; Refill: 6  4. Acquired hypothyroidism Controlled - levothyroxine (SYNTHROID) 100 MCG tablet; Take 1 tablet (100 mcg total) by mouth daily.  Dispense: 30 tablet; Refill: 6  5.  Centrilobular emphysema (HCC) Stable - mometasone-formoterol (DULERA) 100-5 MCG/ACT AERO; Inhale 2 puffs into the lungs 2 (two) times daily.  Dispense: 13 g; Refill: 6  6. Right foot pain Advised to obtain insoles - Ambulatory referral to Podiatry   Meds ordered this encounter  Medications   allopurinol (ZYLOPRIM) 100 MG tablet    Sig: Take 1 tablet (100 mg total) by mouth daily.    Dispense:  30 tablet    Refill:  6   amLODipine (NORVASC) 10 MG tablet    Sig: Take 1 tablet (10 mg total) by mouth daily.    Dispense:  30 tablet    Refill:  6   atorvastatin (LIPITOR) 40 MG tablet    Sig: Take 1 tablet (40 mg total) by mouth daily.    Dispense:  30 tablet    Refill:  6   levothyroxine  (SYNTHROID) 100 MCG tablet    Sig: Take 1 tablet (100 mcg total) by mouth daily.    Dispense:  30 tablet    Refill:  6   labetalol (NORMODYNE) 300 MG tablet    Sig: Take 1 tablet (300 mg total) by mouth 2 (two) times daily.    Dispense:  60 tablet    Refill:  6   mometasone-formoterol (DULERA) 100-5 MCG/ACT AERO    Sig: Inhale 2 puffs into the lungs 2 (two) times daily.    Dispense:  13 g    Refill:  6   predniSONE (DELTASONE) 20 MG tablet    Sig: Take 2 tablets (40 mg total) by mouth daily with breakfast for 3 doses.    Dispense:  6 tablet    Refill:  0    Follow-up: Return in about 6 months (around 11/28/2022) for Chronic medical conditions.       Charlott Rakes, MD, FAAFP. [facility] [place], [place]   05/28/2022, 2:45 PM

## 2022-05-28 NOTE — Patient Instructions (Signed)
Plantar Fasciitis  Plantar fasciitis is a painful foot condition that affects the heel. It occurs when the band of tissue that connects the toes to the heel bone (plantar fascia) becomes irritated. This can happen as the result of exercising too much or doing other repetitive activities (overuse injury). Plantar fasciitis can cause mild irritation to severe pain that makes it difficult to walk or move. The pain is usually worse in the morning after sleeping, or after sitting or lying down for a period of time. Pain may also be worse after long periods of walking or standing. What are the causes? This condition may be caused by: Standing for long periods of time. Wearing shoes that do not have good arch support. Doing activities that put stress on joints (high-impact activities). This includes ballet and exercise that makes your heart beat faster (aerobic exercise), such as running. Being overweight. An abnormal way of walking (gait). Tight muscles in the back of your lower leg (calf). High arches in your feet or flat feet. Starting a new athletic activity. What are the signs or symptoms? The main symptom of this condition is heel pain. Pain may get worse after the following: Taking the first steps after a time of rest, especially in the morning after awakening, or after you have been sitting or lying down for a while. Long periods of standing still. Pain may decrease after 30-45 minutes of activity, such as gentle walking. How is this diagnosed? This condition may be diagnosed based on your medical history, a physical exam, and your symptoms. Your health care provider will check for: A tender area on the bottom of your foot. A high arch in your foot or flat feet. Pain when you move your foot. Difficulty moving your foot. You may have imaging tests to confirm the diagnosis, such as: X-rays. Ultrasound. MRI. How is this treated? Treatment for plantar fasciitis depends on how severe your  condition is. Treatment may include: Rest, ice, pressure (compression), and raising (elevating) the affected foot. This is called RICE therapy. Your health care provider may recommend RICE therapy along with over-the-counter pain medicines to manage your pain. Exercises to stretch your calves and your plantar fascia. A splint that holds your foot in a stretched, upward position while you sleep (night splint). Physical therapy to relieve symptoms and prevent problems in the future. Injections of steroid medicine (cortisone) to relieve pain and inflammation. Stimulating your plantar fascia with electrical impulses (extracorporeal shock wave therapy). This is usually the last treatment option before surgery. Surgery, if other treatments have not worked after 12 months. Follow these instructions at home: Managing pain, stiffness, and swelling  If directed, put ice on the painful area. To do this: Put ice in a plastic bag, or use a frozen bottle of water. Place a towel between your skin and the bag or bottle. Roll the bottom of your foot over the bag or bottle. Do this for 20 minutes, 2-3 times a day. Wear athletic shoes that have air-sole or gel-sole cushions, or try soft shoe inserts that are designed for plantar fasciitis. Elevate your foot above the level of your heart while you are sitting or lying down. Activity Avoid activities that cause pain. Ask your health care provider what activities are safe for you. Do physical therapy exercises and stretches as told by your health care provider. Try activities and forms of exercise that are easier on your joints (low impact). Examples include swimming, water aerobics, and biking. General instructions Take over-the-counter   and prescription medicines only as told by your health care provider. Wear a night splint while sleeping, if told by your health care provider. Loosen the splint if your toes tingle, become numb, or turn cold and blue. Maintain a  healthy weight, or work with your health care provider to lose weight as needed. Keep all follow-up visits. This is important. Contact a health care provider if you have: Symptoms that do not go away with home treatment. Pain that gets worse. Pain that affects your ability to move or do daily activities. Summary Plantar fasciitis is a painful foot condition that affects the heel. It occurs when the band of tissue that connects the toes to the heel bone (plantar fascia) becomes irritated. Heel pain is the main symptom of this condition. It may get worse after exercising too much or standing still for a long time. Treatment varies, but it usually starts with rest, ice, pressure (compression), and raising (elevating) the affected foot. This is called RICE therapy. Over-the-counter medicines can also be used to manage pain. This information is not intended to replace advice given to you by your health care provider. Make sure you discuss any questions you have with your health care provider. Document Revised: 02/08/2020 Document Reviewed: 02/08/2020 Elsevier Patient Education  2023 [employer]  

## 2022-05-28 NOTE — Progress Notes (Signed)
Pain in feet

## 2022-05-29 DIAGNOSIS — Z114 Encounter for screening for human immunodeficiency virus [HIV]: Secondary | ICD-10-CM | POA: Diagnosis not present

## 2022-05-29 DIAGNOSIS — Z113 Encounter for screening for infections with a predominantly sexual mode of transmission: Secondary | ICD-10-CM | POA: Diagnosis not present

## 2022-05-31 ENCOUNTER — Telehealth: Payer: Self-pay | Admitting: Emergency Medicine

## 2022-05-31 NOTE — Telephone Encounter (Signed)
Copied from [place] [id number]. Topic: General - Other >> May 31, 2022  9:20 AM [place] B wrote: Reason for [id number] called in states he needs form 2728 faxed over to social dept at [phone], also gave me email of [email or web address]

## 2022-05-31 NOTE — Telephone Encounter (Signed)
Call placed to patient and VM is currently full. Mychart message has been sent to patient inquiring what type of form is a [phone]

## 2022-06-05 ENCOUNTER — Emergency Department (HOSPITAL_COMMUNITY): Payer: Medicaid Other

## 2022-06-05 ENCOUNTER — Encounter (HOSPITAL_COMMUNITY): Payer: Self-pay

## 2022-06-05 ENCOUNTER — Ambulatory Visit (HOSPITAL_COMMUNITY)
Admission: EM | Admit: 2022-06-05 | Discharge: 2022-06-05 | Disposition: A | Discharge status: Home / Self Care | Payer: Medicaid Other

## 2022-06-05 ENCOUNTER — Inpatient Hospital Stay (HOSPITAL_COMMUNITY)
Admission: EM | Admit: 2022-06-05 | Discharge: 2022-06-07 | DRG: 153 | Disposition: A | Discharge status: Home / Self Care | Payer: Medicaid Other | Attending: Internal Medicine | Admitting: Internal Medicine

## 2022-06-05 ENCOUNTER — Other Ambulatory Visit: Payer: Self-pay

## 2022-06-05 DIAGNOSIS — Z888 Allergy status to other drugs, medicaments and biological substances status: Secondary | ICD-10-CM | POA: Diagnosis not present

## 2022-06-05 DIAGNOSIS — Z5900 Homelessness unspecified: Secondary | ICD-10-CM

## 2022-06-05 DIAGNOSIS — E039 Hypothyroidism, unspecified: Secondary | ICD-10-CM | POA: Diagnosis present

## 2022-06-05 DIAGNOSIS — J029 Acute pharyngitis, unspecified: Secondary | ICD-10-CM | POA: Diagnosis not present

## 2022-06-05 DIAGNOSIS — N185 Chronic kidney disease, stage 5: Secondary | ICD-10-CM | POA: Diagnosis not present

## 2022-06-05 DIAGNOSIS — B950 Streptococcus, group A, as the cause of diseases classified elsewhere: Secondary | ICD-10-CM | POA: Diagnosis present

## 2022-06-05 DIAGNOSIS — Z87891 Personal history of nicotine dependence: Secondary | ICD-10-CM

## 2022-06-05 DIAGNOSIS — D631 Anemia in chronic kidney disease: Secondary | ICD-10-CM | POA: Diagnosis present

## 2022-06-05 DIAGNOSIS — R070 Pain in throat: Secondary | ICD-10-CM | POA: Diagnosis not present

## 2022-06-05 DIAGNOSIS — H9202 Otalgia, left ear: Secondary | ICD-10-CM | POA: Diagnosis not present

## 2022-06-05 DIAGNOSIS — M109 Gout, unspecified: Secondary | ICD-10-CM | POA: Diagnosis not present

## 2022-06-05 DIAGNOSIS — Z79890 Hormone replacement therapy (postmenopausal): Secondary | ICD-10-CM

## 2022-06-05 DIAGNOSIS — I132 Hypertensive heart and chronic kidney disease with heart failure and with stage 5 chronic kidney disease, or end stage renal disease: Secondary | ICD-10-CM | POA: Diagnosis not present

## 2022-06-05 DIAGNOSIS — J449 Chronic obstructive pulmonary disease, unspecified: Secondary | ICD-10-CM | POA: Diagnosis not present

## 2022-06-05 DIAGNOSIS — J36 Peritonsillar abscess: Principal | ICD-10-CM | POA: Diagnosis present

## 2022-06-05 DIAGNOSIS — Z833 Family history of diabetes mellitus: Secondary | ICD-10-CM | POA: Diagnosis not present

## 2022-06-05 DIAGNOSIS — G4733 Obstructive sleep apnea (adult) (pediatric): Secondary | ICD-10-CM | POA: Diagnosis not present

## 2022-06-05 DIAGNOSIS — B1920 Unspecified viral hepatitis C without hepatic coma: Secondary | ICD-10-CM | POA: Diagnosis present

## 2022-06-05 DIAGNOSIS — I10 Essential (primary) hypertension: Secondary | ICD-10-CM

## 2022-06-05 DIAGNOSIS — E785 Hyperlipidemia, unspecified: Secondary | ICD-10-CM | POA: Diagnosis not present

## 2022-06-05 DIAGNOSIS — R633 Feeding difficulties, unspecified: Secondary | ICD-10-CM | POA: Diagnosis present

## 2022-06-05 DIAGNOSIS — Z8249 Family history of ischemic heart disease and other diseases of the circulatory system: Secondary | ICD-10-CM

## 2022-06-05 DIAGNOSIS — J358 Other chronic diseases of tonsils and adenoids: Secondary | ICD-10-CM | POA: Diagnosis not present

## 2022-06-05 DIAGNOSIS — I5032 Chronic diastolic (congestive) heart failure: Secondary | ICD-10-CM | POA: Diagnosis present

## 2022-06-05 DIAGNOSIS — E872 Acidosis, unspecified: Secondary | ICD-10-CM | POA: Diagnosis not present

## 2022-06-05 DIAGNOSIS — Z79899 Other long term (current) drug therapy: Secondary | ICD-10-CM | POA: Diagnosis not present

## 2022-06-05 DIAGNOSIS — Z91013 Allergy to seafood: Secondary | ICD-10-CM | POA: Diagnosis not present

## 2022-06-05 DIAGNOSIS — J039 Acute tonsillitis, unspecified: Secondary | ICD-10-CM | POA: Diagnosis not present

## 2022-06-05 DIAGNOSIS — R590 Localized enlarged lymph nodes: Secondary | ICD-10-CM | POA: Diagnosis not present

## 2022-06-05 LAB — BASIC METABOLIC PANEL
Anion gap: 10 (ref 5–15)
BUN: 97 mg/dL — ABNORMAL HIGH (ref 6–20)
CO2: 19 mmol/L — ABNORMAL LOW (ref 22–32)
Calcium: 9 mg/dL (ref 8.9–10.3)
Chloride: 110 mmol/L (ref 98–111)
Creatinine, Ser: 7.92 mg/dL — ABNORMAL HIGH (ref 0.61–1.24)
GFR, Estimated: 7 mL/min — ABNORMAL LOW (ref >60)
Glucose, Bld: 114 mg/dL — ABNORMAL HIGH (ref 70–99)
Potassium: 4 mmol/L (ref 3.5–5.1)
Sodium: 139 mmol/L (ref 135–145)

## 2022-06-05 LAB — GROUP A STREP BY PCR: Group A Strep by PCR: DETECTED — AB

## 2022-06-05 LAB — CBC WITH DIFFERENTIAL/PLATELET
Abs Immature Granulocytes: 0.09 10*3/uL — ABNORMAL HIGH (ref 0.00–0.07)
Basophils Absolute: 0.1 10*3/uL (ref 0.0–0.1)
Basophils Relative: 0 %
Eosinophils Absolute: 0 10*3/uL (ref 0.0–0.5)
Eosinophils Relative: 0 %
HCT: 32.7 % — ABNORMAL LOW (ref 39.0–52.0)
Hemoglobin: 10.8 g/dL — ABNORMAL LOW (ref 13.0–17.0)
Immature Granulocytes: 1 %
Lymphocytes Relative: 11 %
Lymphs Abs: 2 10*3/uL (ref 0.7–4.0)
MCH: 25.2 pg — ABNORMAL LOW (ref 26.0–34.0)
MCHC: 33 g/dL (ref 30.0–36.0)
MCV: 76.2 fL — ABNORMAL LOW (ref 80.0–100.0)
Monocytes Absolute: 1.5 10*3/uL — ABNORMAL HIGH (ref 0.1–1.0)
Monocytes Relative: 8 %
Neutro Abs: 14.6 10*3/uL — ABNORMAL HIGH (ref 1.7–7.7)
Neutrophils Relative %: 80 %
Platelets: 232 10*3/uL (ref 150–400)
RBC: 4.29 MIL/uL (ref 4.22–5.81)
RDW: 15.7 % — ABNORMAL HIGH (ref 11.5–15.5)
WBC: 18.2 10*3/uL — ABNORMAL HIGH (ref 4.0–10.5)
nRBC: 0 % (ref 0.0–0.2)

## 2022-06-05 MED ORDER — HYDROMORPHONE HCL 1 MG/ML IJ SOLN: 0.5000 mg | INTRAMUSCULAR | Status: DC | PRN

## 2022-06-05 MED ORDER — SODIUM CHLORIDE 0.9 % IV SOLN
3.0000 g | Freq: Once | INTRAVENOUS | Status: AC
  Administered 2022-06-05: 3 g via INTRAVENOUS
  Filled 2022-06-05: qty 8

## 2022-06-05 MED ORDER — ALBUTEROL SULFATE (2.5 MG/3ML) 0.083% IN NEBU: 3.0000 mL | INHALATION_SOLUTION | Freq: Four times a day (QID) | RESPIRATORY_TRACT | Status: DC | PRN

## 2022-06-05 MED ORDER — SODIUM CHLORIDE 0.45 % IV SOLN: INTRAVENOUS | Status: DC

## 2022-06-05 MED ORDER — LABETALOL HCL 300 MG PO TABS
300.0000 mg | ORAL_TABLET | Freq: Two times a day (BID) | ORAL | Status: DC
  Administered 2022-06-06 – 2022-06-07 (×4): 300 mg via ORAL
  Filled 2022-06-05: qty 1
  Filled 2022-06-05 (×2): qty 2
  Filled 2022-06-05: qty 1

## 2022-06-05 MED ORDER — PHENOL 1.4 % MT LIQD: 1.0000 | OROMUCOSAL | Status: DC | PRN

## 2022-06-05 MED ORDER — OXYCODONE HCL 5 MG PO TABS: 5.0000 mg | ORAL_TABLET | Freq: Four times a day (QID) | ORAL | Status: DC | PRN

## 2022-06-05 MED ORDER — SODIUM CHLORIDE 0.9 % IV SOLN
3.0000 g | INTRAVENOUS | Status: DC
  Administered 2022-06-06: 3 g via INTRAVENOUS
  Filled 2022-06-05: qty 8

## 2022-06-05 MED ORDER — ALLOPURINOL 100 MG PO TABS
100.0000 mg | ORAL_TABLET | Freq: Every day | ORAL | Status: DC
  Administered 2022-06-06 – 2022-06-07 (×2): 100 mg via ORAL
  Filled 2022-06-05 (×2): qty 1

## 2022-06-05 MED ORDER — SODIUM CHLORIDE 0.9 % IV BOLUS
1000.0000 mL | Freq: Once | INTRAVENOUS | Status: AC
  Administered 2022-06-05: 1000 mL via INTRAVENOUS

## 2022-06-05 MED ORDER — AMLODIPINE BESYLATE 10 MG PO TABS
10.0000 mg | ORAL_TABLET | Freq: Every day | ORAL | Status: DC
  Administered 2022-06-06 – 2022-06-07 (×2): 10 mg via ORAL
  Filled 2022-06-05: qty 1
  Filled 2022-06-05: qty 2

## 2022-06-05 MED ORDER — DEXAMETHASONE SODIUM PHOSPHATE 10 MG/ML IJ SOLN
10.0000 mg | Freq: Once | INTRAMUSCULAR | Status: AC
  Administered 2022-06-05: 10 mg via INTRAVENOUS
  Filled 2022-06-05: qty 1

## 2022-06-05 MED ORDER — DEXAMETHASONE SODIUM PHOSPHATE 10 MG/ML IJ SOLN
8.0000 mg | Freq: Two times a day (BID) | INTRAMUSCULAR | Status: DC
  Filled 2022-06-05: qty 1

## 2022-06-05 MED ORDER — TRIAMCINOLONE ACETONIDE 0.1 % EX CREA
1.0000 | TOPICAL_CREAM | Freq: Two times a day (BID) | CUTANEOUS | Status: DC
Start: 2022-06-05 — End: 2022-06-07
  Administered 2022-06-06 – 2022-06-07 (×3): 1 via TOPICAL
  Filled 2022-06-05: qty 15

## 2022-06-05 MED ORDER — MOMETASONE FURO-FORMOTEROL FUM 100-5 MCG/ACT IN AERO
2.0000 | INHALATION_SPRAY | Freq: Two times a day (BID) | RESPIRATORY_TRACT | Status: DC
  Administered 2022-06-06: 2 via RESPIRATORY_TRACT
  Filled 2022-06-05: qty 8.8

## 2022-06-05 MED ORDER — DEXAMETHASONE SODIUM PHOSPHATE 10 MG/ML IJ SOLN: 10.0000 mg | Freq: Once | INTRAMUSCULAR | Status: DC

## 2022-06-05 MED ORDER — DEXAMETHASONE SODIUM PHOSPHATE 10 MG/ML IJ SOLN
8.0000 mg | Freq: Two times a day (BID) | INTRAMUSCULAR | Status: DC
  Administered 2022-06-06: 8 mg via INTRAVENOUS
  Filled 2022-06-05: qty 1

## 2022-06-05 MED ORDER — HEPARIN SODIUM (PORCINE) 5000 UNIT/ML IJ SOLN
5000.0000 [IU] | Freq: Three times a day (TID) | INTRAMUSCULAR | Status: DC
  Administered 2022-06-06: 5000 [IU] via SUBCUTANEOUS
  Filled 2022-06-05 (×3): qty 1

## 2022-06-05 MED ORDER — LEVOTHYROXINE SODIUM 100 MCG PO TABS
100.0000 ug | ORAL_TABLET | Freq: Every day | ORAL | Status: DC
  Administered 2022-06-06 – 2022-06-07 (×2): 100 ug via ORAL
  Filled 2022-06-05 (×2): qty 1

## 2022-06-05 MED ORDER — ATORVASTATIN CALCIUM 40 MG PO TABS
40.0000 mg | ORAL_TABLET | Freq: Every day | ORAL | Status: DC
  Administered 2022-06-06 – 2022-06-07 (×2): 40 mg via ORAL
  Filled 2022-06-05 (×2): qty 1

## 2022-06-05 MED ORDER — ACETAMINOPHEN 325 MG PO TABS: 650.0000 mg | ORAL_TABLET | Freq: Four times a day (QID) | ORAL | Status: DC | PRN

## 2022-06-05 NOTE — ED Provider Notes (Signed)
[facility] EMERGENCY DEPARTMENT Provider Note   CSN: [id number] Arrival date & time: 06/05/22  1731     History {Add pertinent medical, surgical, social history, OB history to HPI:1} Chief Complaint  Patient presents with   Sore Throat    Johnny [name] is a 61 y.o. male.   Sore Throat    Patient with history of COPD, chronic kidney disease not on dialysis, CHF, tobacco use disorder presents today due to sore throat.  Symptoms started a few days ago, its worse on the left side of his throat.  He has not been able to tolerate eating or drinking for the last 3 days.  He is handling his own secretions, denies any fevers or chills at home.  Seen in urgent care, sent to ED for further evaluation due to concern for peritonsillar abscess.  Home Medications Prior to Admission medications   Medication Sig Start Date End Date Taking? Authorizing Provider  albuterol (VENTOLIN HFA) 108 (90 Base) MCG/ACT inhaler Inhale 2 puffs into the lungs every 6 (six) hours as needed for wheezing or shortness of breath. 12/01/21   Barb Merino, MD  allopurinol (ZYLOPRIM) 100 MG tablet Take 1 tablet (100 mg total) by mouth daily. 05/28/22   Charlott Rakes, MD  amLODipine (NORVASC) 10 MG tablet Take 1 tablet (10 mg total) by mouth daily. 05/28/22   Charlott Rakes, MD  atorvastatin (LIPITOR) 40 MG tablet Take 1 tablet (40 mg total) by mouth daily. 05/28/22   Charlott Rakes, MD  Blood Pressure Monitoring (3 SERIES BP MONITOR/UPPER ARM) DEVI 1 Units by Does not apply route 2 (two) times daily. 10/13/19   Elsie Stain, MD  calcitRIOL (ROCALTROL) 0.5 MCG capsule Take 0.5 mcg by mouth daily. 05/03/22   [provider]  clotrimazole-betamethasone (LOTRISONE) cream Apply to affected area 2 times daily. Patient not taking: Reported on 05/23/2022 05/12/21   Jaynee Eagles, PA-C  furosemide (LASIX) 40 MG tablet Take 1 tablet by mouth twice daily 03/30/22   Charlott Rakes, MD  isosorbide  mononitrate (IMDUR) 30 MG 24 hr tablet Take 1 tablet (30 mg total) by mouth daily. 05/24/22 08/22/22  Axel Filler, MD  labetalol (NORMODYNE) 300 MG tablet Take 1 tablet (300 mg total) by mouth 2 (two) times daily. 05/28/22   Charlott Rakes, MD  levothyroxine (SYNTHROID) 100 MCG tablet Take 1 tablet (100 mcg total) by mouth daily. 05/28/22   Charlott Rakes, MD  Misc. Devices MISC Blood Pressure monitor. Dx: HTN 11/25/19   Charlott Rakes, MD  mometasone-formoterol (DULERA) 100-5 MCG/ACT AERO Inhale 2 puffs into the lungs 2 (two) times daily. 05/28/22   Charlott Rakes, MD  triamcinolone (KENALOG) 0.1 % Apply 1 application topically 2 (two) times daily. 11/09/20   Charlott Rakes, MD      Allergies    Naproxen    Review of Systems   Review of Systems  Physical Exam Updated Vital Signs BP (!) 156/82   Pulse 78   Temp 98.3 F (36.8 C)   Resp 16   SpO2 100%  Physical Exam Vitals and nursing note reviewed. Exam conducted with a chaperone present.  Constitutional:      Appearance: Normal appearance.  HENT:     Head: Normocephalic and atraumatic.     Mouth/Throat:     Comments: Significant swelling to the left hard palate, difficult to visualize posterior oropharynx secondary to swelling.  No sublingual swelling, handling secretions.  Trismus present. Eyes:     General: No scleral  icterus.       Right eye: No discharge.        Left eye: No discharge.     Extraocular Movements: Extraocular movements intact.     Pupils: Pupils are equal, round, and reactive to light.  Cardiovascular:     Rate and Rhythm: Normal rate and regular rhythm.     Pulses: Normal pulses.     Heart sounds: Normal heart sounds. No murmur heard.    No friction rub. No gallop.  Pulmonary:     Effort: Pulmonary effort is normal. No respiratory distress.     Breath sounds: Normal breath sounds.  Abdominal:     General: Abdomen is flat. Bowel sounds are normal. There is no distension.     Palpations:  Abdomen is soft.     Tenderness: There is no abdominal tenderness.  Skin:    General: Skin is warm and dry.     Coloration: Skin is not jaundiced.  Neurological:     Mental Status: He is alert. Mental status is at baseline.     Coordination: Coordination normal.     ED Results / Procedures / Treatments   Labs (all labs ordered are listed, but only abnormal results are displayed) Labs Reviewed  GROUP A STREP BY PCR - Abnormal; Notable for the following components:      Result Value   Group A Strep by PCR DETECTED (*)    All other components within normal limits  CBC WITH DIFFERENTIAL/PLATELET - Abnormal; Notable for the following components:   WBC 18.2 (*)    Hemoglobin 10.8 (*)    HCT 32.7 (*)    MCV 76.2 (*)    MCH 25.2 (*)    RDW 15.7 (*)    Neutro Abs 14.6 (*)    Monocytes Absolute 1.5 (*)    Abs Immature Granulocytes 0.09 (*)    All other components within normal limits  BASIC METABOLIC PANEL - Abnormal; Notable for the following components:   CO2 19 (*)    Glucose, Bld 114 (*)    BUN 97 (*)    Creatinine, Ser 7.92 (*)    GFR, Estimated 7 (*)    All other components within normal limits    EKG None  Radiology CT SOFT TISSUE NECK WO CONTRAST  Result Date: 06/05/2022 CLINICAL DATA:  Sore throat and earache EXAM: CT NECK WITHOUT CONTRAST TECHNIQUE: Multidetector CT imaging of the neck was performed following the standard protocol without intravenous contrast. RADIATION DOSE REDUCTION: This exam was performed according to the departmental dose-optimization program which includes automated exposure control, adjustment of the mA and/or kV according to patient size and/or use of iterative reconstruction technique. COMPARISON:  None Available. FINDINGS: Pharynx and larynx: There is a masslike lesion of the left palatine tonsil with low attenuation, possibly a fluid collection. This deviates the airway to the right but does not cause airway stenosis. The epiglottis is normal.  No retropharyngeal abnormality. Salivary glands: No inflammation, mass, or stone. Thyroid: Normal. Lymph nodes: Left cervical lymph nodes measuring up to 14 mm. Vascular: Unremarkable Limited intracranial: Normal Visualized orbits: Normal Mastoids and visualized paranasal sinuses: Clear. Skeleton: No acute or aggressive process. Upper chest: Negative. Other: None. IMPRESSION: 1. Masslike lesion of the left palatine tonsil with low attenuation, likely a peritonsillar abscess. Clinical follow-up after treatment is recommended to exclude neoplasm. Repeat imaging at that time she be considered. 2. Left cervical lymphadenopathy, likely reactive. Electronically Signed   By: Cletus Gash.D.  On: 06/05/2022 22:11    Procedures Procedures  {Document cardiac monitor, telemetry assessment procedure when appropriate:1}  Medications Ordered in ED Medications  dexamethasone (DECADRON) injection 8 mg (has no administration in time range)  Ampicillin-Sulbactam (UNASYN) 3 g in sodium chloride 0.9 % 100 mL IVPB (0 g Intravenous Stopped 06/05/22 2210)  dexamethasone (DECADRON) injection 10 mg (10 mg Intravenous Given 06/05/22 2137)  sodium chloride 0.9 % bolus 1,000 mL (1,000 mLs Intravenous New Bag/Given 06/05/22 2136)    ED Course/ Medical Decision Making/ A&P                           Medical Decision Making Amount and/or Complexity of Data Reviewed Radiology: ordered.  Risk Prescription drug management. Decision regarding hospitalization.   Patient presents due to sore throat.  Differential clues not limited to strep pharyngitis, peritonsillar abscess, retropharyngeal, Ludwig angina.  On exam patient's presentation is concerning for possible tonsillar abscess.  He has trismus, swelling to the left palate.  He is handling his secretions but reportedly has not been eating or drinking.  I ordered Unasyn, Decadron.  I ordered and reviewed laboratory work-up.  Patient is chronic kidney disease and  creatinine is 7.  There is no gross electrolyte derangement, he has a significant leukocytosis of 18 per my interpretation.  Patient is strep positive.  CT soft tissue neck ordered without contrast secondary to intake renal disease.  Pulm interpretation concern for peritonsillar abscess left.  I consulted with Dr.Skotnicki with ENT.  She advises Decadron, clindamycin and is able to see him in the office tomorrow for I&D.  Discussed with patient, he does not feel comfortable returning home given difficulty swallowing at this time.  I spoke again with Dr. Roseanna Rainbow, she is able to see the patient tomorrow afternoon and drain in the ED.  Advises Decadron 8 mg every 12 hours and in the absence of clindamycin shortage Unasyn is appropriate.  I will consult with hospitalist service for admission  {Document critical care time when appropriate:1} {Document review of labs and clinical decision tools ie heart score, Chads2Vasc2 etc:1}  {Document your independent review of radiology images, and any outside records:1} {Document your discussion with family members, caretakers, and with consultants:1} {Document social determinants of health affecting pt's care:1} {Document your decision making why or why not admission, treatments were needed:1} Final Clinical Impression(s) / ED Diagnoses Final diagnoses:  Peritonsillar abscess    Rx / DC Orders ED Discharge Orders     None

## 2022-06-05 NOTE — ED Triage Notes (Signed)
Pt states sore throat and left ear pain for the past 2 weeks.

## 2022-06-05 NOTE — ED Provider Triage Note (Signed)
Emergency Medicine Provider Triage Evaluation Note  Johnny [name] , a 61 y.o. male  was evaluated in triage.  Pt complains of left-sided sore throat and earache that is progressively worsened over the past 2 weeks.  Patient evaluated urgent care prior to arrival and sent to the ED to rule out peritonsillar abscess.  Patient admits to difficulty swallowing and breathing due to pain.  No fever or chills.  Denies dental pain.  Review of Systems  Positive: Sore throat Negative: Dental pain  Physical Exam  There were no vitals taken for this visit. Gen:   Awake, no distress   Resp:  Normal effort  MSK:   Moves extremities without difficulty  Other:  1 finger trismus- unable to evaluate full oral cavity  Medical Decision Making  Medically screening exam initiated at 6:16 PM.  Appropriate orders placed.  Johnny [name] was informed that the remainder of the evaluation will be completed by another provider, this initial triage assessment does not replace that evaluation, and the importance of remaining in the ED until their evaluation is complete.  Labs Strep CT neck to rule out peritonsillar abscess.    Suzy Bouchard, [name] 06/05/22 1818

## 2022-06-05 NOTE — ED Notes (Signed)
Pt feels as if jaw is tight/locked up - Pt is unable/not willing to hold mouth open very wide. Pt allowed this RN to swab throat. Pt states he has not been able to eat or drink normally. Pt denies any nausea or vomiting but has been spitting

## 2022-06-05 NOTE — ED Triage Notes (Signed)
Pt here c/o sore throat and L ear pain that has been going on for 2 weeks. Pt is refusing to talk in triage, insists on writing things down on paper. Pt  wrote it "hurts to talk, please give me pain medicine so I can drink. I haven't drank or ate in 3 days. Please give me a breathing treatment, my breathing aint right."

## 2022-06-05 NOTE — Progress Notes (Signed)
PHARMACY NOTE:  ANTIMICROBIAL RENAL DOSAGE ADJUSTMENT  Current antimicrobial regimen includes a mismatch between antimicrobial dosage and estimated renal function.  As per policy approved by the Pharmacy & Therapeutics and Medical Executive Committees, the antimicrobial dosage will be adjusted accordingly.  Current antimicrobial dosage:  Unasyn 3g IV every 6 hours  Indication: Peritonsillar abscess  Renal Function:  Estimated Creatinine Clearance: 11.3 mL/min (A) (by C-G formula based on SCr of 7.92 mg/dL (H)). '[]'$      On intermittent HD, scheduled: '[]'$      On CRRT    Antimicrobial dosage has been changed to:  3g IV every 24 hours  Additional comments:   Thank you for allowing pharmacy to be a part of this patient's care.  Bertis Ruddy, [facility] 06/05/2022 11:19 PM

## 2022-06-05 NOTE — ED Notes (Signed)
Pt refusing to open mouth for strep swab.

## 2022-06-05 NOTE — ED Provider Notes (Signed)
[name]    CSN: [id number] Arrival date & time: 06/05/22  1552      History   Chief Complaint Chief Complaint  Patient presents with   Sore Throat    HPI Johnny [name] is a 61 y.o. male.   61 year old male presents with sore throat and earache.  Patient indicates for the past 2 weeks he has had a sore throat which has become progressive and severe over the past 3 days.  Patient indicates he also has earache associated with the left ear.  Patient is having problems communicating and is not able to talk due to the degree of sore throat and his hoarseness that he is having which is secondary to the swelling.  Patient relates over the past 3 days that his sore throat has become moderately severe, he is not able to eat and swallow solid foods.  Patient indicates that it is difficult for him to swallow liquids.  Patient is without fever today.  Patient relates he has had some mild cough, chest congestion and rhinitis that has been clear to slightly discolored.  Interim take the history with the patient he is unable to talk and when he does he really has a raspy voice.  The patient has trismus and is unable to fully open his jaw.   Sore Throat    Past Medical History:  Diagnosis Date   Adenomatous colon polyp    tubular   CHF (congestive heart failure) (HCC)    Chronic kidney disease    COPD (chronic obstructive pulmonary disease) ([place])    Diverticulosis    External otitis of right ear 11/10/2018   Hepatitis C    Hypertension    OSA (obstructive sleep apnea) 11/03/2015   Strep throat 01/2021    Patient Active Problem List   Diagnosis Date Noted   COPD exacerbation ([place]) 05/23/2022   AKI (acute kidney injury) ([place]) 11/29/2021   Toenail fungus 06/11/2019   Homelessness 06/11/2019   Other atopic dermatitis 06/11/2019   Hypothyroidism 06/11/2019   Eczema of hand 11/10/2018   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   Acute on chronic diastolic CHF (congestive heart  failure) ([place]) 11/10/2018   Hyperlipidemia 11/10/2018   Anxiety and depression 02/12/2018   History of tobacco use disorder 12/07/2016   Gout 12/07/2016   COPD (chronic obstructive pulmonary disease) ([place]) 06/07/2016   CKD (chronic kidney disease) stage 5, GFR less than 15 ml/min ([place]) 06/07/2016   Essential hypertension 06/07/2016   OSA (obstructive sleep apnea) 11/03/2015    Past Surgical History:  Procedure Laterality Date   AV FISTULA PLACEMENT  09/12/2009   Left arm AVF       Home Medications    Prior to Admission medications   Medication Sig Start Date End Date Taking? Authorizing Provider  albuterol (VENTOLIN HFA) 108 (90 Base) MCG/ACT inhaler Inhale 2 puffs into the lungs every 6 (six) hours as needed for wheezing or shortness of breath. 12/01/21   Barb Merino, MD  allopurinol (ZYLOPRIM) 100 MG tablet Take 1 tablet (100 mg total) by mouth daily. 05/28/22   Charlott Rakes, MD  amLODipine (NORVASC) 10 MG tablet Take 1 tablet (10 mg total) by mouth daily. 05/28/22   Charlott Rakes, MD  atorvastatin (LIPITOR) 40 MG tablet Take 1 tablet (40 mg total) by mouth daily. 05/28/22   Charlott Rakes, MD  Blood Pressure Monitoring (3 SERIES BP MONITOR/UPPER ARM) DEVI 1 Units by Does not apply route 2 (two) times daily. 10/13/19  Elsie Stain, MD  calcitRIOL (ROCALTROL) 0.5 MCG capsule Take 0.5 mcg by mouth daily. 05/03/22   [provider]  clotrimazole-betamethasone (LOTRISONE) cream Apply to affected area 2 times daily. Patient not taking: Reported on 05/23/2022 05/12/21   Jaynee Eagles, PA-C  furosemide (LASIX) 40 MG tablet Take 1 tablet by mouth twice daily 03/30/22   Charlott Rakes, MD  isosorbide mononitrate (IMDUR) 30 MG 24 hr tablet Take 1 tablet (30 mg total) by mouth daily. 05/24/22 08/22/22  Axel Filler, MD  labetalol (NORMODYNE) 300 MG tablet Take 1 tablet (300 mg total) by mouth 2 (two) times daily. 05/28/22   Charlott Rakes, MD  levothyroxine (SYNTHROID)  100 MCG tablet Take 1 tablet (100 mcg total) by mouth daily. 05/28/22   Charlott Rakes, MD  Misc. Devices MISC Blood Pressure monitor. Dx: HTN 11/25/19   Charlott Rakes, MD  mometasone-formoterol (DULERA) 100-5 MCG/ACT AERO Inhale 2 puffs into the lungs 2 (two) times daily. 05/28/22   Charlott Rakes, MD  triamcinolone (KENALOG) 0.1 % Apply 1 application topically 2 (two) times daily. 11/09/20   Charlott Rakes, MD    Family History Family History  Problem Relation Age of Onset   Hypertension Mother    Diabetes Mother    Colon cancer Neg Hx     Social History Social History   Tobacco Use   Smoking status: Former    Packs/day: 0.10    Types: Cigarettes    Quit date: 04/02/2011    Years since quitting: 11.1   Smokeless tobacco: Never   Tobacco comments:    trying to quit  Vaping Use   Vaping Use: Never used  Substance Use Topics   Alcohol use: Never    Alcohol/week: 0.0 standard drinks of alcohol   Drug use: No     Allergies   Naproxen   Review of Systems Review of Systems  HENT:  Positive for postnasal drip, rhinorrhea and sore throat.      Physical Exam Triage Vital Signs ED Triage Vitals  Enc Vitals Group     BP 06/05/22 1604 132/79     Pulse Rate 06/05/22 1604 72     Resp 06/05/22 1604 16     Temp 06/05/22 1604 98.6 F (37 C)     Temp Source 06/05/22 1604 Oral     SpO2 06/05/22 1604 98 %     Weight --      Height --      Head Circumference --      Peak Flow --      Pain Score 06/05/22 1607 10     Pain Loc --      Pain Edu? --      Excl. in [place]? --    No data found.  Updated Vital Signs BP 132/79 (BP Location: Right Arm)   Pulse 72   Temp 98.6 F (37 C) (Oral)   Resp 16   SpO2 98%   Visual Acuity Right Eye Distance:   Left Eye Distance:   Bilateral Distance:    Right Eye Near:   Left Eye Near:    Bilateral Near:     Physical Exam Constitutional:      Appearance: He is well-developed.  HENT:     Right Ear: Tympanic membrane and ear  canal normal.     Left Ear: Tympanic membrane and ear canal normal.     Mouth/Throat:     Comments: Mouth: Patient has trismus and is limited in the degree that  he can open his mouth to get good visualization of the oral cavity.  There is considerable redness and swelling of the left posterior soft palate, this is encroaching on the oral cavity contributing to the patient's symptoms.  Full exam and better visualization is limited due to the condition Lymphadenopathy:     Cervical: No cervical adenopathy.  Neurological:     Mental Status: He is alert.      UC Treatments / Results  Labs (all labs ordered are listed, but only abnormal results are displayed) Labs Reviewed - No data to display  EKG   Radiology No results found.  Procedures Procedures (including critical care time)  Medications Ordered in UC Medications - No data to display  Initial Impression / Assessment and Plan / UC Course  I have reviewed the triage vital signs and the nursing notes.  Pertinent labs & imaging results that were available during my care of the patient were reviewed by me and considered in my medical decision making (see chart for details).    Plan: 1.  Patient is advised to report to the emergency room for evaluation of trismus at this and acute peritonsillar abscess of the left side. 2.  Advised to follow-up with PCP and return to urgent care as needed. Final Clinical Impressions(s) / UC Diagnoses   Final diagnoses:  Pharyngitis, unspecified etiology  Peritonsillar abscess     Discharge Instructions      Advised to report to the emergency room for evaluation of an acute peritonsillar abscess.    ED Prescriptions   None    PDMP not reviewed this encounter.   Nyoka Lint, PA-C 06/05/22 1644

## 2022-06-05 NOTE — ED Notes (Signed)
Dr. Hall at bedside.

## 2022-06-05 NOTE — ED Provider Notes (Signed)
I personally evaluated the patient during the encounter and completed a history, physical, procedures, medical decision making to contribute to the overall care of the patient and decision making for the patient briefly, the patient is a 61 y.o. male with chronic kidney disease, COPD, hypertension.  Patient with 2 weeks of sore throat.  He appears on exam to have some trismus, fairly significant swelling to the left hard palate difficult to see past that area.  He has no drooling, no submandibular swelling overall differential diagnosis is likely strep infection with likely early phlegmon/abscess development.  He has normal range of motion of his neck and have a lower concern for retropharyngeal process.  We will get CBC, CMP, give a dose IV Unasyn, IV Decadron and IV fluids.  Ultimately patient with chronic kidney disease his creatinine stable at 7.92.  White count is 18 per my review and interpretation of labs.  Overall patient really is hesitant to use IV contrast which I think is reasonable as he states he has been trying to stay off of dialysis for a long time.  We will go ahead with noncontrasted study although this will likely not be ideal for radiology and ENT.  His strep test is positive.  Overall anticipate admission for IV antibiotics and ENT evaluation in the morning.  Please see PA note for further results, evaluation, disposition of the patient  This chart was dictated using voice recognition software.  Despite best efforts to proofread,  errors can occur which can change the documentation meaning.    EKG Interpretation None            Lennice Sites, DO 06/05/22 2122

## 2022-06-05 NOTE — Discharge Instructions (Addendum)
Advised to report to the emergency room for evaluation of an acute peritonsillar abscess.

## 2022-06-05 NOTE — H&P (Signed)
History and Physical  TAO SATZ OYD:[id number] DOB: [birth year] DOA: 06/05/2022  Referring physician: Ezequiel Essex  PCP: Charlott Rakes, MD  Outpatient Specialists: None Patient coming from: Home  Chief Complaint: Sore throat.  HPI: Johnny [name] is a 61 y.o. male with medical history significant for CKD 5 with AV fistula, hypertension, hyperlipidemia, hypothyroidism, chronic diastolic CHF, COPD, former tobacco user, history of homelessness, who presented to [facility] ED with complaints of a sore throat.  Onset 2 weeks ago, progressively worsening in the last 3 days, interfering with his ability to eat and swallow solid foods.  Also reports left ear ache.  No reported fevers or chills.  Work-up in the ED revealed group A strep with peritonsillar abscess seen on CT scan.  EDP discussed the case with ENT who recommended IV antibiotics and IV Decadron.  The patient received Unasyn 3 g x 1, IV Decadron 10 mg x 1, and 1 L IV fluid bolus NS in the ED.  TRH, hospitalist service was asked to admit.  ED Course: Tmax 98.9.  BP 156/82, pulse 78, respiration rate 16, O2 saturation 100% on room air.  Lab studies remarkable for serum bicarb 19, BUN 97, creatinine 7.92, GFR of 7.  WBC 18.2, hemoglobin 10.8, MCV 76, platelet count 15.7.  Neutrophil count 14.6.  Monocyte count 1.5.  Review of Systems: Review of systems as noted in the HPI. All other systems reviewed and are negative.   Past Medical History:  Diagnosis Date   Adenomatous colon polyp    tubular   CHF (congestive heart failure) (HCC)    Chronic kidney disease    COPD (chronic obstructive pulmonary disease) ([place])    Diverticulosis    External otitis of right ear 11/10/2018   Hepatitis C    Hypertension    OSA (obstructive sleep apnea) 11/03/2015   Strep throat 01/2021   Past Surgical History:  Procedure Laterality Date   AV FISTULA PLACEMENT  09/12/2009   Left arm AVF    Social History:  reports that he quit smoking about 11 years  ago. His smoking use included cigarettes. He smoked an average of .1 packs per day. He has never used smokeless tobacco. He reports that he does not drink alcohol and does not use drugs.   Allergies  Allergen Reactions   Naproxen Other (See Comments)    Unknown    Family History  Problem Relation Age of Onset   Hypertension Mother    Diabetes Mother    Colon cancer Neg Hx       Prior to Admission medications   Medication Sig Start Date End Date Taking? Authorizing Provider  albuterol (VENTOLIN HFA) 108 (90 Base) MCG/ACT inhaler Inhale 2 puffs into the lungs every 6 (six) hours as needed for wheezing or shortness of breath. 12/01/21   Barb Merino, MD  allopurinol (ZYLOPRIM) 100 MG tablet Take 1 tablet (100 mg total) by mouth daily. 05/28/22   Charlott Rakes, MD  amLODipine (NORVASC) 10 MG tablet Take 1 tablet (10 mg total) by mouth daily. 05/28/22   Charlott Rakes, MD  atorvastatin (LIPITOR) 40 MG tablet Take 1 tablet (40 mg total) by mouth daily. 05/28/22   Charlott Rakes, MD  Blood Pressure Monitoring (3 SERIES BP MONITOR/UPPER ARM) DEVI 1 Units by Does not apply route 2 (two) times daily. 10/13/19   Elsie Stain, MD  calcitRIOL (ROCALTROL) 0.5 MCG capsule Take 0.5 mcg by mouth daily. 05/03/22   [provider]  clotrimazole-betamethasone (LOTRISONE) cream  Apply to affected area 2 times daily. Patient not taking: Reported on 05/23/2022 05/12/21   Jaynee Eagles, PA-C  furosemide (LASIX) 40 MG tablet Take 1 tablet by mouth twice daily 03/30/22   Charlott Rakes, MD  isosorbide mononitrate (IMDUR) 30 MG 24 hr tablet Take 1 tablet (30 mg total) by mouth daily. 05/24/22 08/22/22  Axel Filler, MD  labetalol (NORMODYNE) 300 MG tablet Take 1 tablet (300 mg total) by mouth 2 (two) times daily. 05/28/22   Charlott Rakes, MD  levothyroxine (SYNTHROID) 100 MCG tablet Take 1 tablet (100 mcg total) by mouth daily. 05/28/22   Charlott Rakes, MD  Misc. Devices MISC Blood Pressure  monitor. Dx: HTN 11/25/19   Charlott Rakes, MD  mometasone-formoterol (DULERA) 100-5 MCG/ACT AERO Inhale 2 puffs into the lungs 2 (two) times daily. 05/28/22   Charlott Rakes, MD  triamcinolone (KENALOG) 0.1 % Apply 1 application topically 2 (two) times daily. 11/09/20   Charlott Rakes, MD    Physical Exam: BP (!) 156/82   Pulse 78   Temp 98.3 F (36.8 C)   Resp 16   SpO2 100%   General: 61 y.o. year-old male well developed well nourished in no acute distress.  Alert and oriented x3. Cardiovascular: Regular rate and rhythm with no rubs or gallops.  No thyromegaly or JVD noted.  No lower extremity edema. 2/4 pulses in all 4 extremities. Respiratory: Clear to auscultation with no wheezes or rales.  Poor inspiratory effort. Abdomen: Soft nontender nondistended with normal bowel sounds x4 quadrants. Muskuloskeletal: No cyanosis, clubbing or edema noted bilaterally Neuro: CN II-XII intact, strength, sensation, reflexes Skin: No ulcerative lesions noted or rashes Psychiatry: Judgement and insight appear normal. Mood is appropriate for condition and setting          Labs on Admission:  Basic Metabolic Panel: Recent Labs  Lab 06/05/22 1852  NA 139  K 4.0  CL 110  CO2 19*  GLUCOSE 114*  BUN 97*  CREATININE 7.92*  CALCIUM 9.0   Liver Function Tests: No results for input(s): "AST", "ALT", "ALKPHOS", "BILITOT", "PROT", "ALBUMIN" in the last 168 hours. No results for input(s): "LIPASE", "AMYLASE" in the last 168 hours. No results for input(s): "AMMONIA" in the last 168 hours. CBC: Recent Labs  Lab 06/05/22 1852  WBC 18.2*  NEUTROABS 14.6*  HGB 10.8*  HCT 32.7*  MCV 76.2*  PLT 232   Cardiac Enzymes: No results for input(s): "CKTOTAL", "CKMB", "CKMBINDEX", "TROPONINI" in the last 168 hours.  BNP (last 3 results) Recent Labs    11/28/21 2105 05/23/22 0757  BNP 1,296.1* 163.1*    ProBNP (last 3 results) No results for input(s): "PROBNP" in the last 8760  hours.  CBG: No results for input(s): "GLUCAP" in the last 168 hours.  Radiological Exams on Admission: CT SOFT TISSUE NECK WO CONTRAST  Result Date: 06/05/2022 CLINICAL DATA:  Sore throat and earache EXAM: CT NECK WITHOUT CONTRAST TECHNIQUE: Multidetector CT imaging of the neck was performed following the standard protocol without intravenous contrast. RADIATION DOSE REDUCTION: This exam was performed according to the departmental dose-optimization program which includes automated exposure control, adjustment of the mA and/or kV according to patient size and/or use of iterative reconstruction technique. COMPARISON:  None Available. FINDINGS: Pharynx and larynx: There is a masslike lesion of the left palatine tonsil with low attenuation, possibly a fluid collection. This deviates the airway to the right but does not cause airway stenosis. The epiglottis is normal. No retropharyngeal abnormality. Salivary glands: No inflammation,  mass, or stone. Thyroid: Normal. Lymph nodes: Left cervical lymph nodes measuring up to 14 mm. Vascular: Unremarkable Limited intracranial: Normal Visualized orbits: Normal Mastoids and visualized paranasal sinuses: Clear. Skeleton: No acute or aggressive process. Upper chest: Negative. Other: None. IMPRESSION: 1. Masslike lesion of the left palatine tonsil with low attenuation, likely a peritonsillar abscess. Clinical follow-up after treatment is recommended to exclude neoplasm. Repeat imaging at that time she be considered. 2. Left cervical lymphadenopathy, likely reactive. Electronically Signed   By: Ulyses Jarred M.D.   On: 06/05/2022 22:11    EKG: I independently viewed the EKG done and my findings are as followed: None available at the time of this visit.  Assessment/Plan Present on Admission:  Group A streptococcal infection  Principal Problem:   Group A streptococcal infection  Group A streptococcal infection with peritonsillar abscess seen on CT scan CT soft  tissue neck without contrast revealed masslike lesion of the left palatine tonsil with low attenuation, likely a peritonsillar abscess. Group A strep by PCR positive on 06/05/2022, was also positive on 01/23/2021.  Group A strep culture was positive on 06/17/2017. The patient is protecting his airway. Started on Unasyn in the ED, continue. Received IV Decadron 10 mg x 1, continue IV Decadron 8 mg twice daily as recommended by ENT. Follow blood cultures x2 peripherally Follow MRSA screening test Monitor fever curve and WBC. Pain control in place Addendum: MRSA screening returned positive- Linezolid added to broaden coverage- Follow cultures for ID and sensitivities to narrow down antibiotics.  Leukocytosis Presenting WBC 18.2K Follow blood cultures for ID and sensitivities Monitor fever curve and WBCs Narrow down antibiotics when able  Hypertension, BP is not at goal, elevated. Resume home oral antihypertensives. Monitor vital signs.  CKD 5 Appears to be at his baseline creatinine 7.9 with GFR of 7 Avoid nephrotoxic agents, dehydration and hypotension. Start gentle IV fluid hydration half-normal saline at 50 cc/h x 2 days. Monitor strict I's and O's and daily weight  Anemia of chronic disease in the setting of advanced CKD Appears to be at his baseline hemoglobin 10.8 MCV 76.2 Obtain iron studies, replete with iron supplement if deficient. No overt bleeding reported Continue to monitor.  Non anion gap metabolic acidosis in the setting of renal insufficiency Serum bicarb 19, anion gap of 10 IV fluid hydration half-normal saline at 50 cc/h x 2 days Repeat renal panel in the morning.  Hypothyroidism Resume home levothyroxine  Chronic diastolic CHF Euvolemic on exam Last 2D echo showed LVEF 60 to 65% with grade 2 diastolic dysfunction Closely monitor volume status while on gentle IV fluid hydration. Start strict I's and O's and daily weight  COPD Resume home  bronchodilators. No acute issues.  Hyperlipidemia Resume home Lipitor  Gout Resume home allopurinol.   Critical care time: 65 minutes.    DVT prophylaxis: Subcu heparin 3 times daily  Code Status: Full code  Family Communication: None at bedside  Disposition Plan: Admitted to progressive care unit  Consults called: ENT consulted by EDP.  Admission status: Inpatient status   Status is: Inpatient The patient requires at least 2 midnights for further evaluation and treatment of present condition.   Kayleen Memos MD [facility] Pager [phone]  If 7PM-7AM, please contact night-coverage [email or web address] Password [facility]  06/05/2022, 11:32 PM

## 2022-06-05 NOTE — ED Notes (Signed)
IV access lost. Pt is holding IV catheter in his hand, accidentally pulled out with arm movement. Abx finished, but liter of fluid is not complete, will start a new line.

## 2022-06-06 ENCOUNTER — Encounter (HOSPITAL_COMMUNITY): Payer: Self-pay | Admitting: Internal Medicine

## 2022-06-06 DIAGNOSIS — J039 Acute tonsillitis, unspecified: Secondary | ICD-10-CM | POA: Diagnosis not present

## 2022-06-06 DIAGNOSIS — B950 Streptococcus, group A, as the cause of diseases classified elsewhere: Secondary | ICD-10-CM | POA: Diagnosis not present

## 2022-06-06 DIAGNOSIS — E039 Hypothyroidism, unspecified: Secondary | ICD-10-CM

## 2022-06-06 DIAGNOSIS — J358 Other chronic diseases of tonsils and adenoids: Secondary | ICD-10-CM | POA: Diagnosis not present

## 2022-06-06 DIAGNOSIS — J36 Peritonsillar abscess: Secondary | ICD-10-CM | POA: Diagnosis not present

## 2022-06-06 DIAGNOSIS — I5032 Chronic diastolic (congestive) heart failure: Secondary | ICD-10-CM | POA: Diagnosis not present

## 2022-06-06 DIAGNOSIS — N185 Chronic kidney disease, stage 5: Secondary | ICD-10-CM | POA: Diagnosis not present

## 2022-06-06 DIAGNOSIS — J449 Chronic obstructive pulmonary disease, unspecified: Secondary | ICD-10-CM | POA: Diagnosis not present

## 2022-06-06 DIAGNOSIS — D631 Anemia in chronic kidney disease: Secondary | ICD-10-CM | POA: Diagnosis not present

## 2022-06-06 DIAGNOSIS — E872 Acidosis, unspecified: Secondary | ICD-10-CM | POA: Diagnosis not present

## 2022-06-06 LAB — COMPREHENSIVE METABOLIC PANEL
ALT: 22 U/L (ref 0–44)
AST: 21 U/L (ref 15–41)
Albumin: 3.2 g/dL — ABNORMAL LOW (ref 3.5–5.0)
Alkaline Phosphatase: 53 U/L (ref 38–126)
Anion gap: 15 (ref 5–15)
BUN: 96 mg/dL — ABNORMAL HIGH (ref 6–20)
CO2: 15 mmol/L — ABNORMAL LOW (ref 22–32)
Calcium: 9.1 mg/dL (ref 8.9–10.3)
Chloride: 112 mmol/L — ABNORMAL HIGH (ref 98–111)
Creatinine, Ser: 7.43 mg/dL — ABNORMAL HIGH (ref 0.61–1.24)
GFR, Estimated: 8 mL/min — ABNORMAL LOW (ref >60)
Glucose, Bld: 105 mg/dL — ABNORMAL HIGH (ref 70–99)
Potassium: 4.2 mmol/L (ref 3.5–5.1)
Sodium: 142 mmol/L (ref 135–145)
Total Bilirubin: 0.7 mg/dL (ref 0.3–1.2)
Total Protein: 7.9 g/dL (ref 6.5–8.1)

## 2022-06-06 LAB — CBC WITH DIFFERENTIAL/PLATELET
Abs Immature Granulocytes: 0.12 10*3/uL — ABNORMAL HIGH (ref 0.00–0.07)
Basophils Absolute: 0.1 10*3/uL (ref 0.0–0.1)
Basophils Relative: 0 %
Eosinophils Absolute: 0 10*3/uL (ref 0.0–0.5)
Eosinophils Relative: 0 %
HCT: 32.5 % — ABNORMAL LOW (ref 39.0–52.0)
Hemoglobin: 10.7 g/dL — ABNORMAL LOW (ref 13.0–17.0)
Immature Granulocytes: 1 %
Lymphocytes Relative: 5 %
Lymphs Abs: 1 10*3/uL (ref 0.7–4.0)
MCH: 25.3 pg — ABNORMAL LOW (ref 26.0–34.0)
MCHC: 32.9 g/dL (ref 30.0–36.0)
MCV: 76.8 fL — ABNORMAL LOW (ref 80.0–100.0)
Monocytes Absolute: 0.3 10*3/uL (ref 0.1–1.0)
Monocytes Relative: 2 %
Neutro Abs: 18 10*3/uL — ABNORMAL HIGH (ref 1.7–7.7)
Neutrophils Relative %: 92 %
Platelets: 226 10*3/uL (ref 150–400)
RBC: 4.23 MIL/uL (ref 4.22–5.81)
RDW: 15.9 % — ABNORMAL HIGH (ref 11.5–15.5)
WBC: 19.5 10*3/uL — ABNORMAL HIGH (ref 4.0–10.5)
nRBC: 0 % (ref 0.0–0.2)

## 2022-06-06 LAB — IRON AND TIBC
Iron: 21 ug/dL — ABNORMAL LOW (ref 45–182)
Saturation Ratios: 9 % — ABNORMAL LOW (ref 17.9–39.5)
TIBC: 242 ug/dL — ABNORMAL LOW (ref 250–450)
UIBC: 221 ug/dL

## 2022-06-06 LAB — MRSA NEXT GEN BY PCR, NASAL: MRSA by PCR Next Gen: DETECTED — AB

## 2022-06-06 LAB — FERRITIN: Ferritin: 604 ng/mL — ABNORMAL HIGH (ref 24–336)

## 2022-06-06 LAB — MAGNESIUM: Magnesium: 2 mg/dL (ref 1.7–2.4)

## 2022-06-06 LAB — PHOSPHORUS: Phosphorus: 5.2 mg/dL — ABNORMAL HIGH (ref 2.5–4.6)

## 2022-06-06 MED ORDER — SODIUM BICARBONATE 650 MG PO TABS
650.0000 mg | ORAL_TABLET | Freq: Two times a day (BID) | ORAL | Status: DC
  Administered 2022-06-06 – 2022-06-07 (×3): 650 mg via ORAL
  Filled 2022-06-06 (×3): qty 1

## 2022-06-06 MED ORDER — LINEZOLID 600 MG/300ML IV SOLN
600.0000 mg | Freq: Two times a day (BID) | INTRAVENOUS | Status: DC
  Administered 2022-06-06: 600 mg via INTRAVENOUS
  Filled 2022-06-06 (×2): qty 300

## 2022-06-06 MED ORDER — DEXAMETHASONE 4 MG PO TABS
4.0000 mg | ORAL_TABLET | Freq: Two times a day (BID) | ORAL | Status: AC
  Administered 2022-06-06 – 2022-06-07 (×2): 4 mg via ORAL
  Filled 2022-06-06 (×2): qty 1

## 2022-06-06 NOTE — Progress Notes (Signed)
Patient is requesting to be discharged. Notified MD

## 2022-06-06 NOTE — ED Notes (Signed)
Collie Siad, RN is taking pt up to inpatient room

## 2022-06-06 NOTE — Progress Notes (Signed)
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 -Denies any shortness of breath  Assessment and Plan:  Left palatine tonsillitis Peritonsillar abscess -Strep a positive -Clinically improving on IV Unasyn and Decadron -Discontinue IV fluids today -Appreciate ENT input, home tomorrow on oral clindamycin and Medrol Dosepak with ENT follow-up -May need biopsy pending reevaluation in clinic  CKD 5 Metabolic acidosis -Baseline creatinine in the 7 range, GFR around 7-8 -Discontinue IV fluids -Followed by Dr. Osborne Casco with [facility], has an upcoming appointment in 9 days, restart diuretics tomorrow -Has an AV fistula for> 10 years -No symptoms of uremia, monitor clinically -Add sodium bicarbonate  Anemia of chronic disease -Hemoglobin stable, anemia panel with iron deficiency as well -Follow-up with nephrology, give IV iron tomorrow  Chronic diastolic CHF -Appears euvolemic discontinue IV fluids, last echo with EF of 60-65% with grade 2 diastolic dysfunction  COPD -Stable, continue bronchodilators  Hypothyroidism -Continue Synthroid   DVT prophylaxis: Heparin subcutaneous Code Status: Full code Family Communication: Discussed with patient detail no family at bedside Disposition Plan: Home tomorrow if stable  Consultants:    Procedures:   Antimicrobials:    Objective: Vitals:   06/06/22 0730 06/06/22 0930 06/06/22 1000 06/06/22 1030  BP: (!) 134/99 (!) 146/90 (!)  158/89 (!) 147/86  Pulse: 72 76 73 69  Resp: 15 (!) 22 17   Temp: 97.9 F (36.6 C)     TempSrc: Oral     SpO2: 100% 100% 100% 100%  Weight: 90.3 kg     Height: '6\' 1"'$  (1.854 m)       Intake/Output Summary (Last 24 hours) at 06/06/2022 1037 Last data filed at 06/06/2022 0559 Gross per 24 hour  Intake 1098.29 ml  Output 1 ml  Net 1097.29 ml   Filed Weights   06/06/22 0730  Weight: 90.3 kg    Examination:  General exam: Appears calm and comfortable  HEENT: Erythematous peritonsillar area, submandibular lymph nodes palpable Respiratory system: Clear to auscultation Cardiovascular system: S1 & S2 heard, RRR.  Abd: nondistended, soft and nontender.Normal bowel sounds heard. Central nervous system: Alert and oriented. No focal neurological deficits. Extremities: no edema, AV fistula Skin: No rashes Psychiatry:  Mood & affect appropriate.     Data Reviewed:   CBC: Recent Labs  Lab 06/05/22 1852 06/05/22 2355  WBC 18.2* 19.5*  NEUTROABS 14.6* 18.0*  HGB 10.8* 10.7*  HCT 32.7* 32.5*  MCV 76.2* 76.8*  PLT 232 631   Basic Metabolic Panel: Recent Labs  Lab 06/05/22 1852 06/05/22 2355  NA 139 142  K 4.0 4.2  CL 110 112*  CO2 19* 15*  GLUCOSE 114* 105*  BUN 97* 96*  CREATININE 7.92* 7.43*  CALCIUM 9.0 9.1  MG  --  2.0  PHOS  --  5.2*   GFR: Estimated Creatinine Clearance: 12.1 mL/min (A) (by C-G formula based on SCr of 7.43 mg/dL (H)). Liver Function Tests: Recent Labs  Lab 06/05/22 2355  AST 21  ALT 22  ALKPHOS 53  BILITOT 0.7  PROT 7.9  ALBUMIN 3.2*   No results for input(s): "LIPASE", "AMYLASE" in the last 168 hours. No results for input(s): "AMMONIA" in the last 168 hours. Coagulation Profile: No results for input(s): "INR", "PROTIME" in the last 168 hours. Cardiac Enzymes: No results for input(s): "CKTOTAL", "CKMB", "CKMBINDEX", "TROPONINI" in the last 168 hours. BNP (last 3 results) No results for input(s): "PROBNP" in the last 8760  hours. HbA1C: No results for input(s): "HGBA1C" in the last 72 hours. CBG: No results for input(s): "GLUCAP" in the last 168 hours. Lipid Profile: No results for input(s): "CHOL", "HDL", "LDLCALC", "TRIG", "CHOLHDL", "LDLDIRECT" in the last 72 hours. Thyroid Function Tests: No results for input(s): "TSH", "T4TOTAL", "FREET4", "T3FREE", "THYROIDAB" in the last 72 hours. Anemia Panel: Recent Labs    06/06/22 0012  FERRITIN 604*  TIBC 242*  IRON 21*   Urine analysis:    Component Value Date/Time   COLORURINE YELLOW 11/29/2021 0501   APPEARANCEUR CLEAR 11/29/2021 0501   LABSPEC 1.012 11/29/2021 0501   PHURINE 5.0 11/29/2021 0501   GLUCOSEU 50 (A) 11/29/2021 0501   HGBUR SMALL (A) 11/29/2021 0501   BILIRUBINUR NEGATIVE 11/29/2021 0501   BILIRUBINUR negative 04/10/2019 1634   KETONESUR NEGATIVE 11/29/2021 0501   PROTEINUR 100 (A) 11/29/2021 0501   UROBILINOGEN 1.0 04/10/2019 1634   UROBILINOGEN 4.0 (H) 06/02/2018 2054   NITRITE NEGATIVE 11/29/2021 0501   LEUKOCYTESUR NEGATIVE 11/29/2021 0501   Sepsis Labs: '@LABRCNTIP'$ (procalcitonin:4,lacticidven:4)  ) Recent Results (from the past 240 hour(s))  Group A Strep by PCR     Status: Abnormal   Collection Time: 06/05/22  5:31 PM   Specimen: Throat; Sterile Swab  Result Value Ref Range Status   Group A Strep by PCR DETECTED (A) NOT DETECTED Final    Comment: Performed at [facility], [place] [address] [place], [place] [address]  Culture, blood (Routine X 2) w Reflex to ID Panel     Status: None (Preliminary result)   Collection Time: 06/05/22 11:39 PM   Specimen: BLOOD RIGHT HAND  Result Value Ref Range Status   Specimen Description BLOOD RIGHT HAND  Final   Special Requests   Final    BOTTLES DRAWN AEROBIC AND ANAEROBIC Blood Culture results may not be optimal due to an inadequate volume of blood received in culture bottles   Culture   Final    NO GROWTH < 12 HOURS Performed at [facility], [place] [address]  [place], [place] [address]    Report Status PENDING  Incomplete  Culture, blood (Routine X 2) w Reflex to ID Panel     Status: None (Preliminary result)   Collection Time: 06/05/22 11:55 PM   Specimen: BLOOD RIGHT FOREARM  Result Value Ref Range Status   Specimen Description BLOOD RIGHT FOREARM  Final   Special Requests   Final    BOTTLES DRAWN AEROBIC AND ANAEROBIC Blood Culture results may not be optimal due to an inadequate volume of blood received in culture bottles   Culture   Final    NO GROWTH < 12 HOURS Performed at [facility], [place] [address], [place], [place] [address]    Report Status PENDING  Incomplete  MRSA Next Gen by PCR, Nasal     Status: Abnormal   Collection Time: 06/06/22 12:37 AM   Specimen: Nasal Mucosa; Nasal Swab  Result Value Ref Range Status   MRSA by PCR Next Gen DETECTED (A) NOT DETECTED Final  Comment: RESULT CALLED TO, READ BACK BY AND VERIFIED WITH: RN ERICA SULLIVAN 06/06/22'@1'$ :57 BY TW (NOTE) The GeneXpert MRSA Assay (FDA approved for NASAL specimens only), is one component of a comprehensive MRSA colonization surveillance program. It is not intended to diagnose MRSA infection nor to guide or monitor treatment for MRSA infections. Test performance is not FDA approved in patients less than 64 years old. Performed at [facility], [place] [address] [place], [place] [address]      Radiology Studies: CT SOFT TISSUE NECK WO CONTRAST  Result Date: 06/05/2022 CLINICAL DATA:  Sore throat and earache EXAM: CT NECK WITHOUT CONTRAST TECHNIQUE: Multidetector CT imaging of the neck was performed following the standard protocol without intravenous contrast. RADIATION DOSE REDUCTION: This exam was performed according to the departmental dose-optimization program which includes automated exposure control, adjustment of the mA and/or kV according to patient size and/or use of iterative reconstruction technique. COMPARISON:  None Available. FINDINGS: Pharynx and  larynx: There is a masslike lesion of the left palatine tonsil with low attenuation, possibly a fluid collection. This deviates the airway to the right but does not cause airway stenosis. The epiglottis is normal. No retropharyngeal abnormality. Salivary glands: No inflammation, mass, or stone. Thyroid: Normal. Lymph nodes: Left cervical lymph nodes measuring up to 14 mm. Vascular: Unremarkable Limited intracranial: Normal Visualized orbits: Normal Mastoids and visualized paranasal sinuses: Clear. Skeleton: No acute or aggressive process. Upper chest: Negative. Other: None. IMPRESSION: 1. Masslike lesion of the left palatine tonsil with low attenuation, likely a peritonsillar abscess. Clinical follow-up after treatment is recommended to exclude neoplasm. Repeat imaging at that time she be considered. 2. Left cervical lymphadenopathy, likely reactive. Electronically Signed   By: Ulyses Jarred M.D.   On: 06/05/2022 22:11     Scheduled Meds:  allopurinol  100 mg Oral Daily   amLODipine  10 mg Oral Daily   atorvastatin  40 mg Oral Daily   dexamethasone  4 mg Oral Q12H   heparin  5,000 Units Subcutaneous Q8H   labetalol  300 mg Oral BID   levothyroxine  100 mcg Oral Daily   mometasone-formoterol  2 puff Inhalation BID   triamcinolone cream  1 Application Topical BID   Continuous Infusions:  ampicillin-sulbactam (UNASYN) IV       LOS: 1 day    Time spent: 98mn    PDomenic Polite MD [facility]   06/06/2022, 10:37 AM

## 2022-06-06 NOTE — ED Notes (Signed)
MD notified of MRSA results

## 2022-06-06 NOTE — Consult Note (Signed)
ENT CONSULT:  Reason for Consult: Peritonsillar abscess/tonsil enlargement  Referring Physician:  Dr. Ronnald Nian ED Provider  HPI: Johnny [name] is an 61 y.o. male with past medical history of chronic kidney disease, COPD, hypertension, former smoker who presented to [facility] ED with complaints of sore throat.  Patient reports that his symptoms first began approximately 2 weeks ago, and has acutely worsened in the last several days, causing him to have difficulty eating.  A CT neck without contrast was performed in the ED due to patient's history of CKD, which demonstrated a masslike lesion of the left palatine tonsil with low-attenuation, concerning for peritonsillar abscess.  Patient had strep test performed, which was positive.  Patient states that he quit smoking approximately 5 years ago.  He denies previous history of peritonsillar abscess or history of recurrent strep throat.  On exam this morning, he notes significant improvement in his symptoms, and states that he is currently tolerating a diet without any difficulty.  Past Medical History:  Diagnosis Date   Adenomatous colon polyp    tubular   CHF (congestive heart failure) (HCC)    Chronic kidney disease    COPD (chronic obstructive pulmonary disease) ([facility])    Diverticulosis    External otitis of right ear 11/10/2018   Hepatitis C    Hypertension    OSA (obstructive sleep apnea) 11/03/2015   Strep throat 01/2021    Past Surgical History:  Procedure Laterality Date   AV FISTULA PLACEMENT  09/12/2009   Left arm AVF    Family History  Problem Relation Age of Onset   Hypertension Mother    Diabetes Mother    Colon cancer Neg Hx     Social History:  reports that he quit smoking about 11 years ago. His smoking use included cigarettes. He smoked an average of .1 packs per day. He has never used smokeless tobacco. He reports that he does not drink alcohol and does not use drugs.  Allergies:  Allergies  Allergen Reactions    Naproxen Other (See Comments)    Unknown reaction    Shellfish Allergy Itching    Medications: I have reviewed the patient's current medications.  Results for orders placed or performed during the hospital encounter of 06/05/22 (from the past 48 hour(s))  Group A Strep by PCR     Status: Abnormal   Collection Time: 06/05/22  5:31 PM   Specimen: Throat; Sterile Swab  Result Value Ref Range   Group A Strep by PCR DETECTED (A) NOT DETECTED    Comment: Performed at [facility], [address] [place], [place] [address]  CBC with Differential     Status: Abnormal   Collection Time: 06/05/22  6:52 PM  Result Value Ref Range   WBC 18.2 (H) 4.0 - 10.5 K/uL   RBC 4.29 4.22 - 5.81 MIL/uL   Hemoglobin 10.8 (L) 13.0 - 17.0 g/dL   HCT 32.7 (L) 39.0 - 52.0 %   MCV 76.2 (L) 80.0 - 100.0 fL   MCH 25.2 (L) 26.0 - 34.0 pg   MCHC 33.0 30.0 - 36.0 g/dL   RDW 15.7 (H) 11.5 - 15.5 %   Platelets 232 150 - 400 K/uL    Comment: REPEATED TO VERIFY   nRBC 0.0 0.0 - 0.2 %   Neutrophils Relative % 80 %   Neutro Abs 14.6 (H) 1.7 - 7.7 K/uL   Lymphocytes Relative 11 %   Lymphs Abs 2.0 0.7 - 4.0 K/uL  Monocytes Relative 8 %   Monocytes Absolute 1.5 (H) 0.1 - 1.0 K/uL   Eosinophils Relative 0 %   Eosinophils Absolute 0.0 0.0 - 0.5 K/uL   Basophils Relative 0 %   Basophils Absolute 0.1 0.0 - 0.1 K/uL   Immature Granulocytes 1 %   Abs Immature Granulocytes 0.09 (H) 0.00 - 0.07 K/uL    Comment: Performed at [facility] [address] [place], [place] [address]  Basic metabolic panel     Status: Abnormal   Collection Time: 06/05/22  6:52 PM  Result Value Ref Range   Sodium 139 135 - 145 mmol/L   Potassium 4.0 3.5 - 5.1 mmol/L   Chloride 110 98 - 111 mmol/L   CO2 19 (L) 22 - 32 mmol/L   Glucose, Bld 114 (H) 70 - 99 mg/dL    Comment: Glucose reference range applies only to samples taken after fasting for at least 8 hours.   BUN 97 (H) 6 - 20 mg/dL   Creatinine, Ser 7.92 (H) 0.61 -  1.24 mg/dL   Calcium 9.0 8.9 - 10.3 mg/dL   GFR, Estimated 7 (L) >60 mL/min    Comment: (NOTE) Calculated using the CKD-EPI Creatinine Equation (2021)    Anion gap 10 5 - 15    Comment: Performed at [facility] [address] [place], [place] [address]  Culture, blood (Routine X 2) w Reflex to ID Panel     Status: None (Preliminary result)   Collection Time: 06/05/22 11:39 PM   Specimen: BLOOD RIGHT HAND  Result Value Ref Range   Specimen Description BLOOD RIGHT HAND    Special Requests      BOTTLES DRAWN AEROBIC AND ANAEROBIC Blood Culture results may not be optimal due to an inadequate volume of blood received in culture bottles   Culture      NO GROWTH < 12 HOURS Performed at [facility] [address], [place], [place] [address]    Report Status PENDING   Culture, blood (Routine X 2) w Reflex to ID Panel     Status: None (Preliminary result)   Collection Time: 06/05/22 11:55 PM   Specimen: BLOOD RIGHT FOREARM  Result Value Ref Range   Specimen Description BLOOD RIGHT FOREARM    Special Requests      BOTTLES DRAWN AEROBIC AND ANAEROBIC Blood Culture results may not be optimal due to an inadequate volume of blood received in culture bottles   Culture      NO GROWTH < 12 HOURS Performed at [facility], [place] [address] [place], [place] [address]    Report Status PENDING   CBC with Differential/Platelet     Status: Abnormal   Collection Time: 06/05/22 11:55 PM  Result Value Ref Range   WBC 19.5 (H) 4.0 - 10.5 K/uL   RBC 4.23 4.22 - 5.81 MIL/uL   Hemoglobin 10.7 (L) 13.0 - 17.0 g/dL   HCT 32.5 (L) 39.0 - 52.0 %   MCV 76.8 (L) 80.0 - 100.0 fL   MCH 25.3 (L) 26.0 - 34.0 pg   MCHC 32.9 30.0 - 36.0 g/dL   RDW 15.9 (H) 11.5 - 15.5 %   Platelets 226 150 - 400 K/uL   nRBC 0.0 0.0 - 0.2 %   Neutrophils Relative % 92 %   Neutro Abs 18.0 (H) 1.7 - 7.7 K/uL   Lymphocytes Relative 5 %   Lymphs Abs 1.0 0.7 - 4.0 K/uL   Monocytes  Relative 2 %   Monocytes  Absolute 0.3 0.1 - 1.0 K/uL   Eosinophils Relative 0 %   Eosinophils Absolute 0.0 0.0 - 0.5 K/uL   Basophils Relative 0 %   Basophils Absolute 0.1 0.0 - 0.1 K/uL   Immature Granulocytes 1 %   Abs Immature Granulocytes 0.12 (H) 0.00 - 0.07 K/uL    Comment: Performed at [facility] [address] [place], [place] [address]  Comprehensive metabolic panel     Status: Abnormal   Collection Time: 06/05/22 11:55 PM  Result Value Ref Range   Sodium 142 135 - 145 mmol/L   Potassium 4.2 3.5 - 5.1 mmol/L   Chloride 112 (H) 98 - 111 mmol/L   CO2 15 (L) 22 - 32 mmol/L   Glucose, Bld 105 (H) 70 - 99 mg/dL    Comment: Glucose reference range applies only to samples taken after fasting for at least 8 hours.   BUN 96 (H) 6 - 20 mg/dL   Creatinine, Ser 7.43 (H) 0.61 - 1.24 mg/dL   Calcium 9.1 8.9 - 10.3 mg/dL   Total Protein 7.9 6.5 - 8.1 g/dL   Albumin 3.2 (L) 3.5 - 5.0 g/dL   AST 21 15 - 41 U/L   ALT 22 0 - 44 U/L   Alkaline Phosphatase 53 38 - 126 U/L   Total Bilirubin 0.7 0.3 - 1.2 mg/dL   GFR, Estimated 8 (L) >60 mL/min    Comment: (NOTE) Calculated using the CKD-EPI Creatinine Equation (2021)    Anion gap 15 5 - 15    Comment: Performed at [facility], [place] [address] [place], [place] [address]  Magnesium     Status: None   Collection Time: 06/05/22 11:55 PM  Result Value Ref Range   Magnesium 2.0 1.7 - 2.4 mg/dL    Comment: Performed at [facility], [place] [address] [place], [place] [address]  Phosphorus     Status: Abnormal   Collection Time: 06/05/22 11:55 PM  Result Value Ref Range   Phosphorus 5.2 (H) 2.5 - 4.6 mg/dL    Comment: Performed at [facility] [address], [place], [place] [address]  Iron and TIBC     Status: Abnormal   Collection Time: 06/06/22 12:12 AM  Result Value Ref Range   Iron 21 (L) 45 - 182 ug/dL   TIBC 242 (L) 250 - 450 ug/dL   Saturation Ratios 9 (L) 17.9 - 39.5 %   UIBC 221 ug/dL    Comment: Performed at [facility], [place] [address], [place], [place] [address]  Ferritin     Status: Abnormal   Collection Time: 06/06/22 12:12 AM  Result Value Ref Range   Ferritin 604 (H) 24 - 336 ng/mL    Comment: Performed at [facility], [place] [address], [place], [place] [address]  MRSA Next Gen by PCR, Nasal     Status: Abnormal   Collection Time: 06/06/22 12:37 AM   Specimen: Nasal Mucosa; Nasal Swab  Result Value Ref Range   MRSA by PCR Next Gen DETECTED (A) NOT DETECTED    Comment: RESULT CALLED TO, READ BACK BY AND VERIFIED WITH: RN ERICA SULLIVAN 06/06/22'@1'$ :58 BY TW (NOTE) The GeneXpert MRSA Assay (FDA approved for NASAL specimens only), is one component of a comprehensive MRSA colonization surveillance program. It is not intended to diagnose MRSA infection nor to guide or monitor treatment for MRSA infections. Test performance is  not FDA approved in patients less than 70 years old. Performed at [facility], [place] [address], [place], [place] [address]     CT SOFT TISSUE NECK WO CONTRAST  Result Date: 06/05/2022 CLINICAL DATA:  Sore throat and earache EXAM: CT NECK WITHOUT CONTRAST TECHNIQUE: Multidetector CT imaging of the neck was performed following the standard protocol without intravenous contrast. RADIATION DOSE REDUCTION: This exam was performed according to the departmental dose-optimization program which includes automated exposure control, adjustment of the mA and/or kV according to patient size and/or use of iterative reconstruction technique. COMPARISON:  None Available. FINDINGS: Pharynx and larynx: There is a masslike lesion of the left palatine tonsil with low attenuation, possibly a fluid collection. This deviates the airway to the right but does not cause airway stenosis. The epiglottis is normal. No retropharyngeal abnormality. Salivary glands: No inflammation, mass, or stone. Thyroid: Normal. Lymph nodes: Left cervical lymph nodes measuring up to 14 mm. Vascular: Unremarkable  Limited intracranial: Normal Visualized orbits: Normal Mastoids and visualized paranasal sinuses: Clear. Skeleton: No acute or aggressive process. Upper chest: Negative. Other: None. IMPRESSION: 1. Masslike lesion of the left palatine tonsil with low attenuation, likely a peritonsillar abscess. Clinical follow-up after treatment is recommended to exclude neoplasm. Repeat imaging at that time she be considered. 2. Left cervical lymphadenopathy, likely reactive. Electronically Signed   By: Ulyses Jarred M.D.   On: 06/05/2022 22:11    ROS:ROS  Blood pressure (!) 134/99, pulse 72, temperature 97.9 F (36.6 C), temperature source Oral, resp. rate 15, height '6\' 1"'$  (1.854 m), weight 90.3 kg, SpO2 100 %.  PHYSICAL EXAM: CONSTITUTIONAL: well developed, nourished, no distress and alert and oriented x 3 PULMONARY/CHEST WALL: effort normal and no stridor, no stertor, no dysphonia HENT: Head : normocephalic and atraumatic Ears: Right ear:   canal normal, external ear normal and hearing normal Left ear:   canal normal, external ear normal and hearing normal Nose: nose normal and no purulence Mouth/Throat:  Mouth: Asymmetric enlargement of the left palate teen tonsil, with no obvious palatal edema.  Tonsil is firm to palpation. Mucous membranes: normal EYES: conjunctiva normal, EOM normal and PERRL NECK: supple, trachea normal and no thyromegaly or cervical LAD  Studies Reviewed: CT neck without contrast reviewed.  Asymmetric enlargement of the left palatine tonsil, concerning for possible underlying mass lesion with superimposed infectious process  Assessment/Plan: Johnny [name] is a 61 year old male with past medical history of chronic kidney disease, former tobacco use presenting for evaluation of progressive left-sided throat pain and dysphagia.  Patient's work-up in ED included CT neck without contrast and lab work including strep testing, which was positive.  Patient treated with Decadron and Unasyn,  and notes significant improvement in his symptoms this morning.  He is currently tolerating a diet without any difficulty  On exam, the left palatine tonsil is enlarged, and firm to palpation.  There is no significant palatal edema.  I counseled patient that I am concerned about the possibility of a mass in the left tonsil, with a superimposed infection.  I recommended continued treatment with antibiotics and steroids, with follow-up with ENT in approximately 1 week. Recommend discharge with Clindamycin TID x10 days and Medrol dosepak. If exam remains concerning for mass lesion, we will proceed with biopsy in the office.  Thank you for allowing me to participate in the care of this patient. Please do not hesitate to contact me with any questions or concerns.   Jason Coop, DO Otolaryngology  [facility] Cell: [phone]   06/06/2022, 10:25 AM

## 2022-06-06 NOTE — ED Notes (Signed)
MD notified that pt passed swallow screen and was able to swallow pills and asking for sandwich - diet order changed by MD

## 2022-06-06 NOTE — ED Notes (Signed)
Pt still continuing to protect airway. Pt able to talk more now

## 2022-06-07 DIAGNOSIS — N185 Chronic kidney disease, stage 5: Secondary | ICD-10-CM | POA: Diagnosis not present

## 2022-06-07 DIAGNOSIS — E872 Acidosis, unspecified: Secondary | ICD-10-CM | POA: Diagnosis not present

## 2022-06-07 DIAGNOSIS — D631 Anemia in chronic kidney disease: Secondary | ICD-10-CM | POA: Diagnosis not present

## 2022-06-07 DIAGNOSIS — E039 Hypothyroidism, unspecified: Secondary | ICD-10-CM | POA: Diagnosis not present

## 2022-06-07 DIAGNOSIS — J36 Peritonsillar abscess: Secondary | ICD-10-CM | POA: Diagnosis not present

## 2022-06-07 DIAGNOSIS — B950 Streptococcus, group A, as the cause of diseases classified elsewhere: Secondary | ICD-10-CM | POA: Diagnosis not present

## 2022-06-07 DIAGNOSIS — J449 Chronic obstructive pulmonary disease, unspecified: Secondary | ICD-10-CM | POA: Diagnosis not present

## 2022-06-07 DIAGNOSIS — I5032 Chronic diastolic (congestive) heart failure: Secondary | ICD-10-CM | POA: Diagnosis not present

## 2022-06-07 LAB — CBC
HCT: 30.6 % — ABNORMAL LOW (ref 39.0–52.0)
Hemoglobin: 10.5 g/dL — ABNORMAL LOW (ref 13.0–17.0)
MCH: 25.5 pg — ABNORMAL LOW (ref 26.0–34.0)
MCHC: 34.3 g/dL (ref 30.0–36.0)
MCV: 74.3 fL — ABNORMAL LOW (ref 80.0–100.0)
Platelets: 230 10*3/uL (ref 150–400)
RBC: 4.12 MIL/uL — ABNORMAL LOW (ref 4.22–5.81)
RDW: 15.4 % (ref 11.5–15.5)
WBC: 28 10*3/uL — ABNORMAL HIGH (ref 4.0–10.5)
nRBC: 0 % (ref 0.0–0.2)

## 2022-06-07 LAB — BASIC METABOLIC PANEL
Anion gap: 12 (ref 5–15)
BUN: 96 mg/dL — ABNORMAL HIGH (ref 6–20)
CO2: 18 mmol/L — ABNORMAL LOW (ref 22–32)
Calcium: 8.7 mg/dL — ABNORMAL LOW (ref 8.9–10.3)
Chloride: 102 mmol/L (ref 98–111)
Creatinine, Ser: 7.34 mg/dL — ABNORMAL HIGH (ref 0.61–1.24)
GFR, Estimated: 8 mL/min — ABNORMAL LOW (ref >60)
Glucose, Bld: 150 mg/dL — ABNORMAL HIGH (ref 70–99)
Potassium: 4.3 mmol/L (ref 3.5–5.1)
Sodium: 132 mmol/L — ABNORMAL LOW (ref 135–145)

## 2022-06-07 MED ORDER — AMOXICILLIN-POT CLAVULANATE 500-125 MG PO TABS: 1.0000 | ORAL_TABLET | Freq: Three times a day (TID) | ORAL | 0 refills | Status: AC

## 2022-06-07 MED ORDER — AMOXICILLIN-POT CLAVULANATE 500-125 MG PO TABS: 1.0000 | ORAL_TABLET | Freq: Three times a day (TID) | ORAL | 0 refills | Status: DC

## 2022-06-07 MED ORDER — FUROSEMIDE 40 MG PO TABS
40.0000 mg | ORAL_TABLET | Freq: Every day | ORAL | Status: DC
  Administered 2022-06-07: 40 mg via ORAL
  Filled 2022-06-07: qty 1

## 2022-06-07 MED ORDER — SODIUM BICARBONATE 650 MG PO TABS: 650.0000 mg | ORAL_TABLET | Freq: Two times a day (BID) | ORAL | 0 refills | Status: DC

## 2022-06-07 MED ORDER — FUROSEMIDE 40 MG PO TABS: 40.0000 mg | ORAL_TABLET | Freq: Two times a day (BID) | ORAL | 1 refills | Status: DC

## 2022-06-07 MED ORDER — DEXAMETHASONE 2 MG PO TABS: 2.0000 mg | ORAL_TABLET | Freq: Every day | ORAL | 0 refills | Status: AC

## 2022-06-07 NOTE — Telephone Encounter (Signed)
Patient was called and he was informed that I will reach out to [employer] and ask for paperwork to be faxed over.   After looking for form online I was able to print requested form and it has been placed in PCP's box

## 2022-06-07 NOTE — Discharge Summary (Signed)
Physician Discharge Summary  Johnny [name] QQI:[id number] DOB: [birth year] DOA: 06/05/2022  PCP: Charlott Rakes, MD  Admit date: 06/05/2022 Discharge date: 06/07/2022  Time spent: 35 minutes  Recommendations for Outpatient Follow-up:  ENT Dr. Fredric Dine in 1 week Neurology Dr. 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, hypertension, hypothyroidism, chronic diastolic CHF, COPD presented to the ED with sore throat for 1 to 2 weeks, progressively worsening, unable to eat or drink for the last 3 days -Work-up in the ED noted creatinine of 7.9, white count of 18, CT neck with concern for peritonsillar abscess  Hospital Course:   Left palatine tonsillitis Peritonsillar abscess -Strep a positive -Clinically improved significantly on IV Unasyn and Decadron -Appreciate ENT input, recommended discharged home on oral antibiotics and steroids, follow-up with ENT in 1 week, anticipate need for biopsy of tonsillar mass   CKD 5 Metabolic acidosis -Baseline creatinine in the 7 range, GFR around 7-8 -Surprisingly without evidence of uremia or volume overload or hyperkalemia, followed by nephrology Dr. [place] Casco, has had an AV fistula for over 10 years -Sodium bicarbonate added to home meds -Restarted home regimen of Lasix 40 Mg twice daily at discharge, patient was advised to call Dr. [place] Casco today for a quick follow-up   Anemia of chronic disease -Hemoglobin stable, anemia panel with iron deficiency as well -Follow-up with nephrology   Chronic diastolic CHF -Euvolemic, last echo with EF of 60-65% with  grade 2 diastolic dysfunction -Oral Lasix resumed   COPD -Stable, continue bronchodilators   Hypothyroidism -Continue Synthroid     Consultations: ENT Dr. Fredric Dine  Discharge Exam: Vitals:   06/07/22 0000 06/07/22 0749  BP: 136/88 (!) 146/95  Pulse: 64 72  Resp: 18 18  Temp: 97.7 F (36.5 C) 97.7 F (36.5 C)  SpO2: 100% 100%      Discharge Instructions   Discharge Instructions     Diet - low sodium heart healthy   Complete by: As directed    Increase activity slowly   Complete by: As directed       Allergies as of 06/07/2022       Reactions   Naproxen Other (See Comments)   Unknown reaction    Shellfish Allergy Itching        Medication List     TAKE these medications    allopurinol 100 MG tablet Commonly known as: ZYLOPRIM Take 1 tablet (100 mg total) by mouth daily.   amLODipine 10 MG tablet Commonly known as: NORVASC Take 1 tablet (10 mg total) by mouth daily.   amoxicillin-clavulanate 500-125 MG tablet Commonly known as: Augmentin Take 1 tablet (500 mg total) by mouth 3 (three) times daily for 8 days.   atorvastatin 40 MG tablet Commonly known as: LIPITOR Take 1 tablet (40 mg total) by mouth daily.   calcitRIOL 0.5 MCG capsule Commonly known as: ROCALTROL Take 0.5 mcg by mouth daily.   dexamethasone 2 MG tablet Commonly known as: DECADRON Take 1 tablet (2 mg total) by mouth daily for 3 days.   Dulera 100-5 MCG/ACT Aero Generic drug: mometasone-formoterol Inhale 2 puffs into the lungs  2 (two) times daily.   furosemide 40 MG tablet Commonly known as: LASIX Take 1 tablet (40 mg total) by mouth 2 (two) times daily.   isosorbide mononitrate 30 MG 24 hr tablet Commonly known as: IMDUR Take 1 tablet (30 mg total) by mouth daily.   labetalol 300 MG tablet Commonly known as: NORMODYNE Take 1 tablet (300 mg total) by mouth 2 (two) times daily.   levothyroxine 100 MCG tablet Commonly known as: SYNTHROID Take 1 tablet (100 mcg  total) by mouth daily.   sodium bicarbonate 650 MG tablet Take 1 tablet (650 mg total) by mouth 2 (two) times daily.   Ventolin HFA 108 (90 Base) MCG/ACT inhaler Generic drug: albuterol Inhale 2 puffs into the lungs every 6 (six) hours as needed for wheezing or shortness of breath.       Allergies  Allergen Reactions   Naproxen Other (See Comments)    Unknown reaction    Shellfish Allergy Itching    Follow-up Information     Skotnicki, Meghan A, DO. Schedule an appointment as soon as possible for a visit in 1 week(s).   Specialty: Otolaryngology Contact information: [place] [address] [phone]         Reesa Chew, MD. Schedule an appointment as soon as possible for a visit in 10 day(s).   Specialty: Internal Medicine Contact information: [place] [address] [phone]                  The results of significant diagnostics from this hospitalization (including imaging, microbiology, ancillary and laboratory) are listed below for reference.    Significant Diagnostic Studies: CT SOFT TISSUE NECK WO CONTRAST  Result Date: 06/05/2022 CLINICAL DATA:  Sore throat and earache EXAM: CT NECK WITHOUT CONTRAST TECHNIQUE: Multidetector CT imaging of the neck was performed following the standard protocol without intravenous contrast. RADIATION DOSE REDUCTION: This exam was performed according to the departmental dose-optimization program which includes automated exposure control, adjustment of the mA and/or kV according to patient size and/or use of iterative reconstruction technique. COMPARISON:  None Available. FINDINGS: Pharynx and larynx: There is a masslike lesion of the left palatine tonsil with low attenuation, possibly a fluid collection. This deviates the airway to the right but does not cause airway stenosis. The epiglottis is normal. No retropharyngeal abnormality. Salivary glands: No inflammation, mass, or  stone. Thyroid: Normal. Lymph nodes: Left cervical lymph nodes measuring up to 14 mm. Vascular: Unremarkable Limited intracranial: Normal Visualized orbits: Normal Mastoids and visualized paranasal sinuses: Clear. Skeleton: No acute or aggressive process. Upper chest: Negative. Other: None. IMPRESSION: 1. Masslike lesion of the left palatine tonsil with low attenuation, likely a peritonsillar abscess. Clinical follow-up after treatment is recommended to exclude neoplasm. Repeat imaging at that time she be considered. 2. Left cervical lymphadenopathy, likely reactive. Electronically Signed   By: Ulyses Jarred M.D.   On: 06/05/2022 22:11   DG Knee 1-2 Views Left  Result Date: 05/23/2022 CLINICAL DATA:  Left lower extremity pain with activity EXAM: LEFT KNEE - 1-2 VIEW COMPARISON:  12/13/2012 FINDINGS: Frontal and lateral views of the left knee are obtained. No fracture, subluxation, or dislocation. Progressive 3 compartmental osteoarthritis, with moderate joint space narrowing and osteophyte formation in the medial and patellofemoral compartments. Moderate joint effusion. Soft tissues are otherwise unremarkable. IMPRESSION: 1. Moderate progressive 3 compartmental osteoarthritis greatest in the medial and patellofemoral compartments. 2. Moderate joint effusion. 3. No acute fracture. Electronically Signed  By: Randa Ngo M.D.   On: 05/23/2022 17:17   DG Foot Complete Right  Result Date: 05/23/2022 CLINICAL DATA:  Right foot pain. EXAM: RIGHT FOOT COMPLETE - 3+ VIEW COMPARISON:  None Available. FINDINGS: There is no evidence of fracture or dislocation. There is no evidence of arthropathy or other focal bone abnormality. Soft tissues are unremarkable. IMPRESSION: Negative. Electronically Signed   By: Marijo Conception M.D.   On: 05/23/2022 10:28   DG Chest 2 View  Result Date: 05/23/2022 CLINICAL DATA:  Shortness of breath today. History of congestive heart failure and chronic kidney disease. EXAM: CHEST -  2 VIEW COMPARISON:  11/28/2021 FINDINGS: Heart size is normal today. Mediastinal shadows are normal. Pulmonary vascularity is normal. The lungs are clear. No edema or effusions. No significant bone finding. IMPRESSION: Normal examination today. No radiographic sign of congestive heart failure or pneumonia. Electronically Signed   By: Nelson Chimes M.D.   On: 05/23/2022 08:23    Microbiology: Recent Results (from the past 240 hour(s))  Group A Strep by PCR     Status: Abnormal   Collection Time: 06/05/22  5:31 PM   Specimen: Throat; Sterile Swab  Result Value Ref Range Status   Group A Strep by PCR DETECTED (A) NOT DETECTED Final    Comment: Performed at [facility], [address], [place], [place] [address]  Culture, blood (Routine X 2) w Reflex to ID Panel     Status: None (Preliminary result)   Collection Time: 06/05/22 11:39 PM   Specimen: BLOOD RIGHT HAND  Result Value Ref Range Status   Specimen Description BLOOD RIGHT HAND  Final   Special Requests   Final    BOTTLES DRAWN AEROBIC AND ANAEROBIC Blood Culture results may not be optimal due to an inadequate volume of blood received in culture bottles   Culture   Final    NO GROWTH < 12 HOURS Performed at [facility], [place] [address] [place], [place] [address]    Report Status PENDING  Incomplete  Culture, blood (Routine X 2) w Reflex to ID Panel     Status: None (Preliminary result)   Collection Time: 06/05/22 11:55 PM   Specimen: BLOOD RIGHT FOREARM  Result Value Ref Range Status   Specimen Description BLOOD RIGHT FOREARM  Final   Special Requests   Final    BOTTLES DRAWN AEROBIC AND ANAEROBIC Blood Culture results may not be optimal due to an inadequate volume of blood received in culture bottles   Culture   Final    NO GROWTH < 12 HOURS Performed at [facility], [place] [address] [place], [place] [address]    Report Status PENDING  Incomplete  MRSA Next Gen by PCR, Nasal     Status: Abnormal   Collection  Time: 06/06/22 12:37 AM   Specimen: Nasal Mucosa; Nasal Swab  Result Value Ref Range Status   MRSA by PCR Next Gen DETECTED (A) NOT DETECTED Final    Comment: RESULT CALLED TO, READ BACK BY AND VERIFIED WITH: RN ERICA SULLIVAN 06/06/22'@1'$ :58 BY TW (NOTE) The GeneXpert MRSA Assay (FDA approved for NASAL specimens only), is one component of a comprehensive MRSA colonization surveillance program. It is not intended to diagnose MRSA infection nor to guide or monitor treatment for MRSA infections. Test performance is not FDA approved in patients less than 72 years old. Performed at [facility], [place] [address] [place], [place] [address]  Labs: Basic Metabolic Panel: Recent Labs  Lab 06/05/22 1852 06/05/22 2355 06/07/22 0508  NA 139 142 132*  K 4.0 4.2 4.3  CL 110 112* 102  CO2 19* 15* 18*  GLUCOSE 114* 105* 150*  BUN 97* 96* 96*  CREATININE 7.92* 7.43* 7.34*  CALCIUM 9.0 9.1 8.7*  MG  --  2.0  --   PHOS  --  5.2*  --    Liver Function Tests: Recent Labs  Lab 06/05/22 2355  AST 21  ALT 22  ALKPHOS 53  BILITOT 0.7  PROT 7.9  ALBUMIN 3.2*   No results for input(s): "LIPASE", "AMYLASE" in the last 168 hours. No results for input(s): "AMMONIA" in the last 168 hours. CBC: Recent Labs  Lab 06/05/22 1852 06/05/22 2355 06/07/22 0508  WBC 18.2* 19.5* 28.0*  NEUTROABS 14.6* 18.0*  --   HGB 10.8* 10.7* 10.5*  HCT 32.7* 32.5* 30.6*  MCV 76.2* 76.8* 74.3*  PLT 232 226 230   Cardiac Enzymes: No results for input(s): "CKTOTAL", "CKMB", "CKMBINDEX", "TROPONINI" in the last 168 hours. BNP: BNP (last 3 results) Recent Labs    11/28/21 2105 05/23/22 0757  BNP 1,296.1* 163.1*    ProBNP (last 3 results) No results for input(s): "PROBNP" in the last 8760 hours.  CBG: No results for input(s): "GLUCAP" in the last 168 hours.     Signed:  Domenic Polite MD.  [facility] 06/07/2022, 12:25 PM

## 2022-06-08 ENCOUNTER — Telehealth: Payer: Self-pay

## 2022-06-08 DIAGNOSIS — Z789 Other specified health status: Secondary | ICD-10-CM

## 2022-06-10 LAB — CULTURE, BLOOD (ROUTINE X 2): Culture: NO GROWTH

## 2022-06-11 LAB — CULTURE, BLOOD (ROUTINE X 2): Culture: NO GROWTH

## 2022-06-11 NOTE — Telephone Encounter (Signed)
Transition Care Management Unsuccessful Follow-up Telephone Call  Date of discharge and from where:  06/07/2022 from [facility]  Attempts:  1st Attempt  Reason for unsuccessful TCM follow-up call:  Left voice message

## 2022-06-13 ENCOUNTER — Telehealth: Payer: Self-pay

## 2022-06-13 NOTE — Telephone Encounter (Signed)
   Telephone encounter was:  Successful.  06/13/2022 Name: [name] MRN: [id number] DOB: 04-21-1961  [name] is a 61 y.o. year old male who is a primary care patient of Charlott Rakes, MD . The community resource team was consulted for assistance with Transportation Needs , Food Insecurity, and Financial Difficulties related to financial strain  Care guide performed the following interventions: Patient provided with information about care guide support team and interviewed to confirm resource needs.Provided Patient with food financial and transportation resources mailed and emailed as requested by patient  Follow Up Plan:  Care guide will follow up with patient by phone over the next day   [place], Care Management  [phone] [address], [place], [place] [address] Phone: [phone] Email: [email or web address]

## 2022-06-13 NOTE — Telephone Encounter (Signed)
Transition Care Management Follow-up Telephone Call Date of discharge and from where: 06/07/2022 from [facility] How have you been since you were released from the hospital? Patient stated that he is feeling much better. No difficulty with eating breathing or sleeping.  Any questions or concerns? No  Items Reviewed: Did the pt receive and understand the discharge instructions provided? Yes  Medications obtained and verified? Yes  Other? No  Any new allergies since your discharge? No  Dietary orders reviewed? No Do you have support at home? Yes   Functional Questionnaire: (I = Independent and D = Dependent) ADLs: I Bathing/Dressing- I Meal Prep- I Eating- I Maintaining continence- I Transferring/Ambulation- I Managing Meds- I   Follow up appointments reviewed: PCP Hospital f/u appt confirmed? No   Specialist Hospital f/u appt confirmed? Yes  Scheduled to see ENT on 06/15/2022 @ 2:00pm. Are transportation arrangements needed? No  If their condition worsens, is the pt aware to call PCP or go to the Emergency Dept.? Yes Was the patient provided with contact information for the PCP's office or ED? Yes Was to pt encouraged to call back with questions or concerns? Yes

## 2022-06-15 ENCOUNTER — Telehealth: Payer: Self-pay

## 2022-06-15 DIAGNOSIS — J358 Other chronic diseases of tonsils and adenoids: Secondary | ICD-10-CM | POA: Diagnosis not present

## 2022-06-15 NOTE — Telephone Encounter (Signed)
   Telephone encounter was:  Successful.  06/15/2022 Name: [name] MRN: [id number] DOB: Sep 22, 1961  Johnny [name] is a 61 y.o. year old male who is a primary care patient of Charlott Rakes, MD . The community resource team was consulted for assistance with Food Insecurity and Financial Difficulties related to financial strain  Care guide performed the following interventions: Patient provided with information about care guide support team and interviewed to confirm resource needs.Patient has the email  and will reach out if needed   Follow Up Plan:  No further follow up planned at this time. The patient has been provided with needed resources.    [place], Care Management  [phone] [address], [place], [place] [address] Phone: [phone] Email: [email or web address]

## 2022-06-21 ENCOUNTER — Ambulatory Visit (HOSPITAL_BASED_OUTPATIENT_CLINIC_OR_DEPARTMENT_OTHER): Payer: Medicaid Other | Admitting: Family Medicine

## 2022-06-21 ENCOUNTER — Encounter: Payer: Self-pay | Admitting: Family Medicine

## 2022-06-21 ENCOUNTER — Telehealth: Payer: Self-pay

## 2022-06-21 DIAGNOSIS — Z029 Encounter for administrative examinations, unspecified: Secondary | ICD-10-CM

## 2022-06-21 DIAGNOSIS — N185 Chronic kidney disease, stage 5: Secondary | ICD-10-CM | POA: Diagnosis not present

## 2022-06-21 NOTE — Telephone Encounter (Signed)
Paperwork was faxed over to number provided in previous message.

## 2022-06-21 NOTE — Progress Notes (Signed)
Virtual Visit via Telephone Note  I connected with Johnny [name], on 06/21/2022 at 8:14 AM by telephone and verified that I am speaking with the correct person using two identifiers.   Consent: I discussed the limitations, risks, security and privacy concerns of performing an evaluation and management service by telephone and the availability of in person appointments. I also discussed with the patient that there may be a patient responsible charge related to this service. The patient expressed understanding and agreed to proceed.   Location of Patient: Home  Location of Provider: Clinic   Persons participating in Telemedicine visit: DONALDSON RICHTER Dr. Margarita Rana     History of Present Illness: Johnny [name] is a 61 y.o. year old male with history of COPD, Gout, stage V chronic kidney disease, hypertension, sleep apnea, HFrEF (EF 60 -65% from 11/2021). He is seen today requesting completion of End-stage renal disease medical evidence report Medicare entitlement and patient registration. He is yet to begin hemodialysis but is currently under the care of a nephrologist and he does have an AV fistula in place. He would like form faxed over to social services and will be calling the clinic back with the number to fax this to. His medical conditions otherwise stable and he is good on all his refills.  Past Medical History:  Diagnosis Date   Adenomatous colon polyp    tubular   CHF (congestive heart failure) (HCC)    Chronic kidney disease    COPD (chronic obstructive pulmonary disease) (HCC)    Diverticulosis    External otitis of right ear 11/10/2018   Hepatitis C    Hypertension    OSA (obstructive sleep apnea) 11/03/2015   Strep throat 01/2021   Allergies  Allergen Reactions   Naproxen Other (See Comments)    Unknown reaction    Shellfish Allergy Itching    Current Outpatient Medications on File Prior to Visit  Medication Sig Dispense Refill   albuterol (VENTOLIN HFA)  108 (90 Base) MCG/ACT inhaler Inhale 2 puffs into the lungs every 6 (six) hours as needed for wheezing or shortness of breath. 18 g 2   allopurinol (ZYLOPRIM) 100 MG tablet Take 1 tablet (100 mg total) by mouth daily. 30 tablet 6   amLODipine (NORVASC) 10 MG tablet Take 1 tablet (10 mg total) by mouth daily. 30 tablet 6   atorvastatin (LIPITOR) 40 MG tablet Take 1 tablet (40 mg total) by mouth daily. 30 tablet 6   calcitRIOL (ROCALTROL) 0.5 MCG capsule Take 0.5 mcg by mouth daily.     furosemide (LASIX) 40 MG tablet Take 1 tablet (40 mg total) by mouth 2 (two) times daily. 60 tablet 1   isosorbide mononitrate (IMDUR) 30 MG 24 hr tablet Take 1 tablet (30 mg total) by mouth daily. 30 tablet 2   labetalol (NORMODYNE) 300 MG tablet Take 1 tablet (300 mg total) by mouth 2 (two) times daily. 60 tablet 6   levothyroxine (SYNTHROID) 100 MCG tablet Take 1 tablet (100 mcg total) by mouth daily. 30 tablet 6   mometasone-formoterol (DULERA) 100-5 MCG/ACT AERO Inhale 2 puffs into the lungs 2 (two) times daily. 13 g 6   sodium bicarbonate 650 MG tablet Take 1 tablet (650 mg total) by mouth 2 (two) times daily. 60 tablet 0   No current facility-administered medications on file prior to visit.    ROS: See HPI  Observations/Objective: Awake, alert, oriented x3 Not in acute distress Normal mood  Latest Ref Rng & Units 06/07/2022    5:08 AM 06/05/2022   11:55 PM 06/05/2022    6:52 PM  CMP  Glucose 70 - 99 mg/dL 150  105  114   BUN 6 - 20 mg/dL 96  96  97   Creatinine 0.61 - 1.24 mg/dL 7.34  7.43  7.92   Sodium 135 - 145 mmol/L 132  142  139   Potassium 3.5 - 5.1 mmol/L 4.3  4.2  4.0   Chloride 98 - 111 mmol/L 102  112  110   CO2 22 - 32 mmol/L '18  15  19   '$ Calcium 8.9 - 10.3 mg/dL 8.7  9.1  9.0   Total Protein 6.5 - 8.1 g/dL  7.9    Total Bilirubin 0.3 - 1.2 mg/dL  0.7    Alkaline Phos 38 - 126 U/L  53    AST 15 - 41 U/L  21    ALT 0 - 44 U/L  22      Lipid Panel     Component Value  Date/Time   CHOL 123 05/24/2022 0341   CHOL 214 (H) 11/10/2018 1104   TRIG 44 05/24/2022 0341   HDL 65 05/24/2022 0341   HDL 96 11/10/2018 1104   CHOLHDL 1.9 05/24/2022 0341   VLDL 9 05/24/2022 0341   LDLCALC 49 05/24/2022 0341   LDLCALC 102 (H) 11/10/2018 1104   LABVLDL 16 11/10/2018 1104    Lab Results  Component Value Date   HGBA1C 5.3 05/24/2022    Assessment and Plan: 1. Chronic kidney disease (CKD), stage V (HCC) Stable Currently under the care of nephrology He is yet to begin hemodialysis  2. Encounters for administrative purpose Completed form based on information from his chart Advised that any subsequent forms will need to be completed by his nephrologist He will call us back with the fax number for the appropriate social service department so we can fax this form   Follow Up Instructions: Keep previously scheduled appointment   I discussed the assessment and treatment plan with the patient. The patient was provided an opportunity to ask questions and all were answered. The patient agreed with the plan and demonstrated an understanding of the instructions.   The patient was advised to call back or seek an in-person evaluation if the symptoms worsen or if the condition fails to improve as anticipated.     I provided 14 minutes total of non-face-to-face time during this encounter.   Charlott Rakes, MD, FAAFP. [facility] [place], [place]   06/21/2022, 8:14 AM

## 2022-06-28 ENCOUNTER — Other Ambulatory Visit: Payer: Self-pay | Admitting: Family Medicine

## 2022-06-28 DIAGNOSIS — M1A00X0 Idiopathic chronic gout, unspecified site, without tophus (tophi): Secondary | ICD-10-CM

## 2022-06-28 NOTE — Telephone Encounter (Signed)
Requested medication (s) are due for refill today: ?  Requested medication (s) are on the active medication list: nO  Last refill:  05/28/22  Future visit scheduled: No  Notes to clinic:  See request.    Requested Prescriptions  Pending Prescriptions Disp Refills   predniSONE (DELTASONE) 20 MG tablet [Pharmacy Med Name: predniSONE 20 MG Oral Tablet] 6 tablet 0    Sig: TAKE 2 TABLETS BY MOUTH ONCE DAILY WITH BREAKFAST FOR  3  DOSES     Not Delegated - Endocrinology:  Oral Corticosteroids Failed - 06/28/2022 12:19 PM      Failed - This refill cannot be delegated      Failed - Manual Review: Eye exam for IOP if prolonged treatment      Failed - Glucose (serum) in normal range and within 180 days    Glucose, Bld  Date Value Ref Range Status  06/07/2022 150 (H) 70 - 99 mg/dL Final    Comment:    Glucose reference range applies only to samples taken after fasting for at least 8 hours.         Failed - Na in normal range and within 180 days    Sodium  Date Value Ref Range Status  06/07/2022 132 (L) 135 - 145 mmol/L Final    Comment:    DELTA CHECK NOTED  11/08/2020 135 134 - 144 mmol/L Final         Failed - Last BP in normal range    BP Readings from Last 1 Encounters:  06/07/22 (!) 146/95         Failed - Bone Mineral Density or Dexa Scan completed in the last 2 years      Passed - K in normal range and within 180 days    Potassium  Date Value Ref Range Status  06/07/2022 4.3 3.5 - 5.1 mmol/L Final         Passed - Valid encounter within last 6 months    Recent Outpatient Visits           1 week ago Chronic kidney disease (CKD), stage V ([place])   [place], Charlane Ferretti, MD   1 month ago Right foot pain   [place], [place], MD   3 months ago Idiopathic chronic gout without tophus, unspecified site   [facility] [place], [place], [name]   3 months ago Mixed  simple and mucopurulent chronic bronchitis [facility])   [place], Vernia Buff, NP   6 months ago Hospital discharge follow-up   [place], MD

## 2022-06-29 ENCOUNTER — Ambulatory Visit: Payer: Medicaid Other | Admitting: Podiatrist

## 2022-07-20 ENCOUNTER — Ambulatory Visit: Payer: Medicaid Other | Admitting: Podiatry

## 2022-08-04 ENCOUNTER — Other Ambulatory Visit: Payer: Self-pay | Admitting: Family Medicine

## 2022-08-04 DIAGNOSIS — M1A00X0 Idiopathic chronic gout, unspecified site, without tophus (tophi): Secondary | ICD-10-CM

## 2022-08-06 ENCOUNTER — Encounter (HOSPITAL_COMMUNITY): Payer: Self-pay

## 2022-08-06 ENCOUNTER — Ambulatory Visit (HOSPITAL_COMMUNITY)
Admission: EM | Admit: 2022-08-06 | Discharge: 2022-08-06 | Disposition: A | Discharge status: Home / Self Care | Payer: Medicaid Other | Attending: Emergency Medicine | Admitting: Emergency Medicine

## 2022-08-06 DIAGNOSIS — M542 Cervicalgia: Secondary | ICD-10-CM

## 2022-08-06 MED ORDER — ACETAMINOPHEN 325 MG PO TABS
650.0000 mg | ORAL_TABLET | Freq: Once | ORAL | Status: AC
  Administered 2022-08-06: 650 mg via ORAL

## 2022-08-06 MED ORDER — CYCLOBENZAPRINE HCL 7.5 MG PO TABS: 7.5000 mg | ORAL_TABLET | Freq: Two times a day (BID) | ORAL | 0 refills | Status: DC | PRN

## 2022-08-06 MED ORDER — ACETAMINOPHEN 325 MG PO TABS
ORAL_TABLET | ORAL | Status: AC
  Filled 2022-08-06: qty 2

## 2022-08-06 MED ORDER — ACETAMINOPHEN 325 MG PO TABS: 650.0000 mg | ORAL_TABLET | Freq: Four times a day (QID) | ORAL | 1 refills | Status: DC | PRN

## 2022-08-06 NOTE — ED Provider Notes (Signed)
[place]    CSN: [id number] Arrival date & time: 08/06/22  1646      History   Chief Complaint Chief Complaint  Patient presents with   Torticollis    HPI Johnny [name] is a 61 y.o. male.  Presents with 2 day history of bilateral neck pain Reports 8/10 pain when looking side to side Has been studying for midterms and using computer a lot, thinks this is related  No fevers, no recent illness, no difficulty breathing. No sore throat. No sick contacts or recent travel  Has not tried anything for symptoms, history of kidney problems  PTA on 8/1, seen by ENT  Past Medical History:  Diagnosis Date   Adenomatous colon polyp    tubular   CHF (congestive heart failure) ([place])    Chronic kidney disease    COPD (chronic obstructive pulmonary disease) ([place])    Diverticulosis    External otitis of right ear 11/10/2018   Hepatitis C    Hypertension    OSA (obstructive sleep apnea) 11/03/2015   Strep throat 01/2021    Patient Active Problem List   Diagnosis Date Noted   Group A streptococcal infection 06/05/2022   COPD exacerbation ([place]) 05/23/2022   AKI (acute kidney injury) ([place]) 11/29/2021   Toenail fungus 06/11/2019   Homelessness 06/11/2019   Other atopic dermatitis 06/11/2019   Hypothyroidism 06/11/2019   Eczema of hand 11/10/2018   A-V fistula ([place]) 11/10/2018   Acute on chronic diastolic CHF (congestive heart failure) ([place]) 11/10/2018   Hyperlipidemia 11/10/2018   Anxiety and depression 02/12/2018   History of tobacco use disorder 12/07/2016   Gout 12/07/2016   COPD (chronic obstructive pulmonary disease) ([place]) 06/07/2016   CKD (chronic kidney disease) stage 5, GFR less than 15 ml/min ([place]) 06/07/2016   Essential hypertension 06/07/2016   OSA (obstructive sleep apnea) 11/03/2015    Past Surgical History:  Procedure Laterality Date   AV FISTULA PLACEMENT  09/12/2009   Left arm AVF       Home Medications    Prior to Admission  medications   Medication Sig Start Date End Date Taking? Authorizing Provider  acetaminophen (TYLENOL) 325 MG tablet Take 2 tablets (650 mg total) by mouth every 6 (six) hours as needed for moderate pain or mild pain. 08/06/22  Yes Jhade Berko, Wells Guiles, PA-C  cyclobenzaprine (FEXMID) 7.5 MG tablet Take 1 tablet (7.5 mg total) by mouth 2 (two) times daily as needed for muscle spasms. 08/06/22  Yes Houa Nie, Wells Guiles, PA-C  albuterol (VENTOLIN HFA) 108 (90 Base) MCG/ACT inhaler Inhale 2 puffs into the lungs every 6 (six) hours as needed for wheezing or shortness of breath. 12/01/21   Barb Merino, MD  allopurinol (ZYLOPRIM) 100 MG tablet Take 1 tablet (100 mg total) by mouth daily. 05/28/22   Charlott Rakes, MD  amLODipine (NORVASC) 10 MG tablet Take 1 tablet (10 mg total) by mouth daily. 05/28/22   Charlott Rakes, MD  atorvastatin (LIPITOR) 40 MG tablet Take 1 tablet (40 mg total) by mouth daily. 05/28/22   Charlott Rakes, MD  calcitRIOL (ROCALTROL) 0.5 MCG capsule Take 0.5 mcg by mouth daily. 05/03/22   [provider]  furosemide (LASIX) 40 MG tablet Take 1 tablet (40 mg total) by mouth 2 (two) times daily. 06/07/22   Domenic Polite, MD  isosorbide mononitrate (IMDUR) 30 MG 24 hr tablet Take 1 tablet (30 mg total) by mouth daily. 05/24/22 08/22/22  Axel Filler, MD  labetalol (NORMODYNE) 300 MG tablet Take  1 tablet (300 mg total) by mouth 2 (two) times daily. 05/28/22   Charlott Rakes, MD  levothyroxine (SYNTHROID) 100 MCG tablet Take 1 tablet (100 mcg total) by mouth daily. 05/28/22   Charlott Rakes, MD  mometasone-formoterol (DULERA) 100-5 MCG/ACT AERO Inhale 2 puffs into the lungs 2 (two) times daily. 05/28/22   Charlott Rakes, MD  sodium bicarbonate 650 MG tablet Take 1 tablet (650 mg total) by mouth 2 (two) times daily. 06/07/22   Domenic Polite, MD    Family History Family History  Problem Relation Age of Onset   Hypertension Mother    Diabetes Mother    Colon cancer Neg Hx      Social History Social History   Tobacco Use   Smoking status: Former    Packs/day: 0.10    Types: Cigarettes    Quit date: 04/02/2011    Years since quitting: 11.3   Smokeless tobacco: Never   Tobacco comments:    trying to quit  Vaping Use   Vaping Use: Never used  Substance Use Topics   Alcohol use: Never    Alcohol/week: 0.0 standard drinks of alcohol   Drug use: No     Allergies   Naproxen and Shellfish allergy   Review of Systems Review of Systems Per HPI  Physical Exam Triage Vital Signs ED Triage Vitals  Enc Vitals Group     BP 08/06/22 1706 (!) 159/89     Pulse Rate 08/06/22 1706 70     Resp 08/06/22 1706 12     Temp 08/06/22 1706 98.3 F (36.8 C)     Temp Source 08/06/22 1706 Oral     SpO2 08/06/22 1706 100 %     Weight 08/06/22 1705 198 lb (89.8 kg)     Height 08/06/22 1705 '6\' 1"'$  (1.854 m)     Head Circumference --      Peak Flow --      Pain Score 08/06/22 1703 4     Pain Loc --      Pain Edu? --      Excl. in [place]? --    No data found.  Updated Vital Signs BP (!) 159/89 (BP Location: Right Arm)   Pulse 70   Temp 98.3 F (36.8 C) (Oral)   Resp 12   Ht '6\' 1"'$  (1.854 m)   Wt 198 lb (89.8 kg)   SpO2 100%   BMI 26.12 kg/m   Physical Exam Vitals and nursing note reviewed.  Constitutional:      Comments: Holding head still, moves upper body with neck  HENT:     Mouth/Throat:     Pharynx: Oropharynx is clear.  Eyes:     Conjunctiva/sclera: Conjunctivae normal.     Pupils: Pupils are equal, round, and reactive to light.  Neck:     Thyroid: No thyromegaly.     Trachea: Trachea and phonation normal.  Musculoskeletal:        General: Tenderness present. No swelling.     Cervical back: Tenderness present. No crepitus. No spinous process tenderness.     Comments: Some muscular tenderness bilat. No spinal tenderness. Decreased ROM with L and R motion due to pain. Better ROM with passive motion but pain limits exam  Skin:    Capillary  Refill: Capillary refill takes less than 2 seconds.  Neurological:     Mental Status: He is alert and oriented to person, place, and time.     Comments: Strength 5/5 upper extrem, sensation intact  distally     UC Treatments / Results  Labs (all labs ordered are listed, but only abnormal results are displayed) Labs Reviewed - No data to display  EKG   Radiology No results found.  Procedures Procedures (including critical care time)  Medications Ordered in UC Medications  acetaminophen (TYLENOL) tablet 650 mg (650 mg Oral Given 08/06/22 1759)    Initial Impression / Assessment and Plan / UC Course  I have reviewed the triage vital signs and the nursing notes.  Pertinent labs & imaging results that were available during my care of the patient were reviewed by me and considered in my medical decision making (see chart for details).  Tylenol given with much improvement in pain. Patient able to move neck a lot more. Discussed possible etiology, could be related to computer use but very strict return precautions. Discussed any worsening or persistent symptoms, needs to be evaluated in ED. With improvement in symptoms I feel he is stable for discharge home at this time. Tylenol and muscle relaxer PRN.  Final Clinical Impressions(s) / UC Diagnoses   Final diagnoses:  Neck pain, bilateral     Discharge Instructions      I recommend using tylenol every 4-6 hours for pain. You can try the muscle relaxer twice daily. If it makes you drowsy, take only at bedtime.  Please go directly to the emergency department if symptoms fail to improve or worsen.     ED Prescriptions     Medication Sig Dispense Auth. Provider   acetaminophen (TYLENOL) 325 MG tablet Take 2 tablets (650 mg total) by mouth every 6 (six) hours as needed for moderate pain or mild pain. 30 tablet Teola Felipe, PA-C   cyclobenzaprine (FEXMID) 7.5 MG tablet Take 1 tablet (7.5 mg total) by mouth 2 (two) times  daily as needed for muscle spasms. 12 tablet Ashlinn Hemrick, Wells Guiles, PA-C      PDMP not reviewed this encounter.   Cruzita Lipa, Vernice Jefferson 08/06/22 2101

## 2022-08-06 NOTE — Discharge Instructions (Addendum)
I recommend using tylenol every 4-6 hours for pain. You can try the muscle relaxer twice daily. If it makes you drowsy, take only at bedtime.  Please go directly to the emergency department if symptoms fail to improve or worsen.

## 2022-08-06 NOTE — ED Triage Notes (Signed)
Pt is here for neck pain . Pt states he is unable to turn his head right or left x 2days

## 2022-08-25 ENCOUNTER — Other Ambulatory Visit: Payer: Self-pay | Admitting: Family Medicine

## 2022-08-25 DIAGNOSIS — M1A00X0 Idiopathic chronic gout, unspecified site, without tophus (tophi): Secondary | ICD-10-CM

## 2022-08-28 ENCOUNTER — Other Ambulatory Visit: Payer: Self-pay | Admitting: Family Medicine

## 2022-08-28 DIAGNOSIS — L2089 Other atopic dermatitis: Secondary | ICD-10-CM

## 2022-09-20 ENCOUNTER — Emergency Department (HOSPITAL_COMMUNITY): Payer: Medicaid Other

## 2022-09-20 ENCOUNTER — Other Ambulatory Visit: Payer: Self-pay

## 2022-09-20 ENCOUNTER — Encounter (HOSPITAL_COMMUNITY): Payer: Self-pay | Admitting: Emergency Medicine

## 2022-09-20 ENCOUNTER — Inpatient Hospital Stay (HOSPITAL_COMMUNITY)
Admission: EM | Admit: 2022-09-20 | Discharge: 2022-09-25 | DRG: 291 | Disposition: A | Discharge status: Home / Self Care | Payer: Medicaid Other | Attending: Internal Medicine | Admitting: Internal Medicine

## 2022-09-20 DIAGNOSIS — I3139 Other pericardial effusion (noninflammatory): Secondary | ICD-10-CM | POA: Diagnosis present

## 2022-09-20 DIAGNOSIS — Z79890 Hormone replacement therapy (postmenopausal): Secondary | ICD-10-CM | POA: Diagnosis not present

## 2022-09-20 DIAGNOSIS — D631 Anemia in chronic kidney disease: Secondary | ICD-10-CM | POA: Diagnosis present

## 2022-09-20 DIAGNOSIS — Z886 Allergy status to analgesic agent status: Secondary | ICD-10-CM | POA: Diagnosis not present

## 2022-09-20 DIAGNOSIS — G4733 Obstructive sleep apnea (adult) (pediatric): Secondary | ICD-10-CM | POA: Diagnosis not present

## 2022-09-20 DIAGNOSIS — Z9989 Dependence on other enabling machines and devices: Secondary | ICD-10-CM | POA: Diagnosis not present

## 2022-09-20 DIAGNOSIS — J441 Chronic obstructive pulmonary disease with (acute) exacerbation: Secondary | ICD-10-CM | POA: Diagnosis not present

## 2022-09-20 DIAGNOSIS — Z86010 Personal history of colonic polyps: Secondary | ICD-10-CM

## 2022-09-20 DIAGNOSIS — E8770 Fluid overload, unspecified: Secondary | ICD-10-CM | POA: Diagnosis not present

## 2022-09-20 DIAGNOSIS — N250 Renal osteodystrophy: Secondary | ICD-10-CM | POA: Diagnosis not present

## 2022-09-20 DIAGNOSIS — E785 Hyperlipidemia, unspecified: Secondary | ICD-10-CM | POA: Diagnosis present

## 2022-09-20 DIAGNOSIS — E872 Acidosis, unspecified: Secondary | ICD-10-CM | POA: Diagnosis not present

## 2022-09-20 DIAGNOSIS — Z833 Family history of diabetes mellitus: Secondary | ICD-10-CM | POA: Diagnosis not present

## 2022-09-20 DIAGNOSIS — Z8619 Personal history of other infectious and parasitic diseases: Secondary | ICD-10-CM

## 2022-09-20 DIAGNOSIS — Z91013 Allergy to seafood: Secondary | ICD-10-CM

## 2022-09-20 DIAGNOSIS — M109 Gout, unspecified: Secondary | ICD-10-CM | POA: Diagnosis not present

## 2022-09-20 DIAGNOSIS — J449 Chronic obstructive pulmonary disease, unspecified: Secondary | ICD-10-CM | POA: Diagnosis present

## 2022-09-20 DIAGNOSIS — Z8249 Family history of ischemic heart disease and other diseases of the circulatory system: Secondary | ICD-10-CM

## 2022-09-20 DIAGNOSIS — F419 Anxiety disorder, unspecified: Secondary | ICD-10-CM

## 2022-09-20 DIAGNOSIS — F32A Depression, unspecified: Secondary | ICD-10-CM

## 2022-09-20 DIAGNOSIS — E7800 Pure hypercholesterolemia, unspecified: Secondary | ICD-10-CM

## 2022-09-20 DIAGNOSIS — I132 Hypertensive heart and chronic kidney disease with heart failure and with stage 5 chronic kidney disease, or end stage renal disease: Principal | ICD-10-CM | POA: Diagnosis present

## 2022-09-20 DIAGNOSIS — E039 Hypothyroidism, unspecified: Secondary | ICD-10-CM | POA: Diagnosis not present

## 2022-09-20 DIAGNOSIS — I2510 Atherosclerotic heart disease of native coronary artery without angina pectoris: Secondary | ICD-10-CM | POA: Diagnosis present

## 2022-09-20 DIAGNOSIS — Z79899 Other long term (current) drug therapy: Secondary | ICD-10-CM

## 2022-09-20 DIAGNOSIS — Z7951 Long term (current) use of inhaled steroids: Secondary | ICD-10-CM

## 2022-09-20 DIAGNOSIS — Z7952 Long term (current) use of systemic steroids: Secondary | ICD-10-CM

## 2022-09-20 DIAGNOSIS — J811 Chronic pulmonary edema: Secondary | ICD-10-CM | POA: Diagnosis not present

## 2022-09-20 DIAGNOSIS — M1A00X0 Idiopathic chronic gout, unspecified site, without tophus (tophi): Secondary | ICD-10-CM | POA: Diagnosis not present

## 2022-09-20 DIAGNOSIS — I509 Heart failure, unspecified: Secondary | ICD-10-CM | POA: Diagnosis not present

## 2022-09-20 DIAGNOSIS — Z87891 Personal history of nicotine dependence: Secondary | ICD-10-CM | POA: Diagnosis not present

## 2022-09-20 DIAGNOSIS — N185 Chronic kidney disease, stage 5: Secondary | ICD-10-CM

## 2022-09-20 DIAGNOSIS — I110 Hypertensive heart disease with heart failure: Secondary | ICD-10-CM | POA: Diagnosis not present

## 2022-09-20 DIAGNOSIS — I5033 Acute on chronic diastolic (congestive) heart failure: Secondary | ICD-10-CM | POA: Diagnosis not present

## 2022-09-20 DIAGNOSIS — R0602 Shortness of breath: Secondary | ICD-10-CM | POA: Diagnosis not present

## 2022-09-20 DIAGNOSIS — I10 Essential (primary) hypertension: Secondary | ICD-10-CM

## 2022-09-20 LAB — CBC
HCT: 23.4 % — ABNORMAL LOW (ref 39.0–52.0)
Hemoglobin: 7.9 g/dL — ABNORMAL LOW (ref 13.0–17.0)
MCH: 24.8 pg — ABNORMAL LOW (ref 26.0–34.0)
MCHC: 33.8 g/dL (ref 30.0–36.0)
MCV: 73.4 fL — ABNORMAL LOW (ref 80.0–100.0)
Platelets: 187 10*3/uL (ref 150–400)
RBC: 3.19 MIL/uL — ABNORMAL LOW (ref 4.22–5.81)
RDW: 17.9 % — ABNORMAL HIGH (ref 11.5–15.5)
WBC: 10.7 10*3/uL — ABNORMAL HIGH (ref 4.0–10.5)
nRBC: 0 % (ref 0.0–0.2)

## 2022-09-20 LAB — BASIC METABOLIC PANEL
Anion gap: 10 (ref 5–15)
BUN: 93 mg/dL — ABNORMAL HIGH (ref 6–20)
CO2: 20 mmol/L — ABNORMAL LOW (ref 22–32)
Calcium: 7.4 mg/dL — ABNORMAL LOW (ref 8.9–10.3)
Chloride: 109 mmol/L (ref 98–111)
Creatinine, Ser: 8.28 mg/dL — ABNORMAL HIGH (ref 0.61–1.24)
GFR, Estimated: 7 mL/min — ABNORMAL LOW (ref >60)
Glucose, Bld: 113 mg/dL — ABNORMAL HIGH (ref 70–99)
Potassium: 4.2 mmol/L (ref 3.5–5.1)
Sodium: 139 mmol/L (ref 135–145)

## 2022-09-20 LAB — BRAIN NATRIURETIC PEPTIDE: B Natriuretic Peptide: 1655.8 pg/mL — ABNORMAL HIGH (ref 0.0–100.0)

## 2022-09-20 MED ORDER — FUROSEMIDE 10 MG/ML IJ SOLN: 80.0000 mg | Freq: Two times a day (BID) | INTRAMUSCULAR | Status: DC

## 2022-09-20 MED ORDER — LABETALOL HCL 200 MG PO TABS
300.0000 mg | ORAL_TABLET | Freq: Two times a day (BID) | ORAL | Status: DC
  Administered 2022-09-21 – 2022-09-25 (×10): 300 mg via ORAL
  Filled 2022-09-20 (×5): qty 1
  Filled 2022-09-20 (×2): qty 2
  Filled 2022-09-20 (×3): qty 1

## 2022-09-20 MED ORDER — IPRATROPIUM-ALBUTEROL 0.5-2.5 (3) MG/3ML IN SOLN
3.0000 mL | Freq: Once | RESPIRATORY_TRACT | Status: AC
  Administered 2022-09-20: 3 mL via RESPIRATORY_TRACT
  Filled 2022-09-20: qty 3

## 2022-09-20 MED ORDER — SODIUM CHLORIDE 0.9% FLUSH
3.0000 mL | Freq: Two times a day (BID) | INTRAVENOUS | Status: DC
  Administered 2022-09-21 – 2022-09-25 (×10): 3 mL via INTRAVENOUS

## 2022-09-20 MED ORDER — ACETAMINOPHEN 325 MG PO TABS
650.0000 mg | ORAL_TABLET | ORAL | Status: DC | PRN
  Administered 2022-09-22 – 2022-09-25 (×4): 650 mg via ORAL
  Filled 2022-09-20 (×4): qty 2

## 2022-09-20 MED ORDER — SODIUM BICARBONATE 650 MG PO TABS
650.0000 mg | ORAL_TABLET | Freq: Two times a day (BID) | ORAL | Status: DC
  Administered 2022-09-21 (×3): 650 mg via ORAL
  Filled 2022-09-20 (×3): qty 1

## 2022-09-20 MED ORDER — IPRATROPIUM-ALBUTEROL 0.5-2.5 (3) MG/3ML IN SOLN
3.0000 mL | RESPIRATORY_TRACT | Status: DC | PRN
  Administered 2022-09-21 – 2022-09-25 (×7): 3 mL via RESPIRATORY_TRACT
  Filled 2022-09-20 (×7): qty 3

## 2022-09-20 MED ORDER — ATORVASTATIN CALCIUM 40 MG PO TABS
40.0000 mg | ORAL_TABLET | Freq: Every day | ORAL | Status: DC
  Administered 2022-09-21 – 2022-09-24 (×5): 40 mg via ORAL
  Filled 2022-09-20 (×5): qty 1

## 2022-09-20 MED ORDER — AMLODIPINE BESYLATE 10 MG PO TABS
10.0000 mg | ORAL_TABLET | Freq: Every day | ORAL | Status: DC
  Administered 2022-09-21 – 2022-09-25 (×6): 10 mg via ORAL
  Filled 2022-09-20: qty 2
  Filled 2022-09-20 (×4): qty 1
  Filled 2022-09-20: qty 2

## 2022-09-20 MED ORDER — ALLOPURINOL 100 MG PO TABS
100.0000 mg | ORAL_TABLET | Freq: Every day | ORAL | Status: DC
  Administered 2022-09-21 – 2022-09-25 (×5): 100 mg via ORAL
  Filled 2022-09-20 (×5): qty 1

## 2022-09-20 MED ORDER — SODIUM CHLORIDE 0.9% FLUSH
3.0000 mL | INTRAVENOUS | Status: DC | PRN
  Administered 2022-09-21: 3 mL via INTRAVENOUS

## 2022-09-20 MED ORDER — ISOSORBIDE MONONITRATE ER 30 MG PO TB24
30.0000 mg | ORAL_TABLET | Freq: Every day | ORAL | Status: DC
  Administered 2022-09-21 – 2022-09-25 (×5): 30 mg via ORAL
  Filled 2022-09-20 (×5): qty 1

## 2022-09-20 MED ORDER — HEPARIN SODIUM (PORCINE) 5000 UNIT/ML IJ SOLN
5000.0000 [IU] | Freq: Three times a day (TID) | INTRAMUSCULAR | Status: DC
  Filled 2022-09-20 (×7): qty 1

## 2022-09-20 MED ORDER — LEVOTHYROXINE SODIUM 100 MCG PO TABS
100.0000 ug | ORAL_TABLET | Freq: Every day | ORAL | Status: DC
  Administered 2022-09-21 – 2022-09-24 (×3): 100 ug via ORAL
  Filled 2022-09-20 (×3): qty 1

## 2022-09-20 MED ORDER — FUROSEMIDE 10 MG/ML IJ SOLN
60.0000 mg | Freq: Once | INTRAMUSCULAR | Status: AC
  Administered 2022-09-20: 60 mg via INTRAVENOUS
  Filled 2022-09-20: qty 6

## 2022-09-20 MED ORDER — PREDNISONE 20 MG PO TABS
60.0000 mg | ORAL_TABLET | Freq: Once | ORAL | Status: AC
  Administered 2022-09-20: 60 mg via ORAL
  Filled 2022-09-20: qty 3

## 2022-09-20 MED ORDER — MOMETASONE FURO-FORMOTEROL FUM 100-5 MCG/ACT IN AERO
2.0000 | INHALATION_SPRAY | Freq: Two times a day (BID) | RESPIRATORY_TRACT | Status: DC
  Administered 2022-09-21 – 2022-09-25 (×9): 2 via RESPIRATORY_TRACT
  Filled 2022-09-20: qty 8.8

## 2022-09-20 MED ORDER — SODIUM CHLORIDE 0.9 % IV SOLN: 250.0000 mL | INTRAVENOUS | Status: DC | PRN

## 2022-09-20 MED ORDER — CALCITRIOL 0.5 MCG PO CAPS
0.5000 ug | ORAL_CAPSULE | Freq: Every day | ORAL | Status: DC
  Administered 2022-09-21 – 2022-09-25 (×5): 0.5 ug via ORAL
  Filled 2022-09-20 (×6): qty 1

## 2022-09-20 MED ORDER — ONDANSETRON HCL 4 MG/2ML IJ SOLN: 4.0000 mg | Freq: Four times a day (QID) | INTRAMUSCULAR | Status: DC | PRN

## 2022-09-20 NOTE — ED Triage Notes (Signed)
Patient arrives by POV c/o shortness of breath worsening over the past week. Patient states he has experienced this before and was diagnosed with CHF. Reports taking his lasix. Patient has fistula to left arm but not on dialysis.

## 2022-09-20 NOTE — ED Provider Triage Note (Signed)
Emergency Medicine Provider Triage Evaluation Note  Johnny [name] , a 61 y.o. male  was evaluated in triage.  Pt complains of SOB x 1 week. Hx of CHF, takes lasix and still urinating decent amount. Doesn't feel he's swelling but can't take a full breath.   Review of Systems  Positive: SOB Negative: CP, leg pain or swelling, abd distention  Physical Exam  BP 131/81 (BP Location: Right Arm)   Pulse 78   Temp 98.1 F (36.7 C) (Oral)   Resp 16   Ht '6\' 1"'$  (1.854 m)   Wt 90.7 kg   SpO2 99%   BMI 26.39 kg/m  Gen:   Awake, no distress   Resp:  Normal effort  MSK:   Moves extremities without difficulty  Other:    Medical Decision Making  Medically screening exam initiated at 1:31 PM.  Appropriate orders placed.  Johnny [name] was informed that the remainder of the evaluation will be completed by another provider, this initial triage assessment does not replace that evaluation, and the importance of remaining in the ED until their evaluation is complete.  Workup initiated   Kateri Plummer, PA-C 09/20/22 1333

## 2022-09-20 NOTE — ED Notes (Signed)
ED Provider at bedside. 

## 2022-09-20 NOTE — H&P (Signed)
PCP:   Charlott Rakes, MD   Chief Complaint: Shortness of breath  HPI: This is a pleasant 61 year old male with past medical history of hypertension, hep C, OSA, CKD stage  V, COPD and history of CHF.  Patient has AV fistula in place but no plans currently for hemodialysis as patient with adequate urine output. Per patient over the past few weeks he has noted decreasing exercise endurance.  When he walks he has to take breaks.  The other day he had to push himself because he was late for meeting.  By the end he felt as though he had overdone it and he needed to go to the hospital right then.  He states he was severely short of breath.  He states his dyspnea is interfering with his schoolwork and his sleep.  When he sleeps he has to use 3 pillows and sleep on the particular side.  This has been ongoing the last 2 weeks.  He reports significant weight gain over the past 6 months, he is unable to quantify.  He states his energy level is shot.  He endorses increasing lower extremity edema.  He denies chest pain, fever, chills, nausea, vomiting.  He states he continues to have adequate urine output.  In the ER patient was significant fluid overload and shortness of breath.  Hospitalist have been asked to admit.  Review of Systems:  Positive for: Shortness of breath, fluid overload, lower extremity edema, decreasing exercise endurance, cough [dry], fatigue, weight gain Negative for: Chest pains, fevers, chills  Past Medical History: Past Medical History:  Diagnosis Date   Adenomatous colon polyp    tubular   CHF (congestive heart failure) ([facility])    Chronic kidney disease    COPD (chronic obstructive pulmonary disease) ([facility])    Diverticulosis    External otitis of right ear 11/10/2018   Hepatitis C    Hypertension    OSA (obstructive sleep apnea) 11/03/2015   Strep throat 01/2021   Past Surgical History:  Procedure Laterality Date   AV FISTULA PLACEMENT  09/12/2009   Left arm AVF     Medications: Prior to Admission medications   Medication Sig Start Date End Date Taking? Authorizing Provider  acetaminophen (TYLENOL) 325 MG tablet Take 2 tablets (650 mg total) by mouth every 6 (six) hours as needed for moderate pain or mild pain. 08/06/22   Rising, Wells Guiles, PA-C  albuterol (VENTOLIN HFA) 108 (90 Base) MCG/ACT inhaler Inhale 2 puffs into the lungs every 6 (six) hours as needed for wheezing or shortness of breath. 12/01/21   Barb Merino, MD  allopurinol (ZYLOPRIM) 100 MG tablet Take 1 tablet (100 mg total) by mouth daily. 05/28/22   Charlott Rakes, MD  amLODipine (NORVASC) 10 MG tablet Take 1 tablet (10 mg total) by mouth daily. 05/28/22   Charlott Rakes, MD  atorvastatin (LIPITOR) 40 MG tablet Take 1 tablet (40 mg total) by mouth daily. 05/28/22   Charlott Rakes, MD  calcitRIOL (ROCALTROL) 0.5 MCG capsule Take 0.5 mcg by mouth daily. 05/03/22   [provider]  cyclobenzaprine (FEXMID) 7.5 MG tablet Take 1 tablet (7.5 mg total) by mouth 2 (two) times daily as needed for muscle spasms. 08/06/22   Rising, Wells Guiles, PA-C  furosemide (LASIX) 40 MG tablet Take 1 tablet (40 mg total) by mouth 2 (two) times daily. 06/07/22   Domenic Polite, MD  ipratropium-albuterol (DUONEB) 0.5-2.5 (3) MG/3ML SOLN Inhale 3 mLs into the lungs every 6 (six) hours as needed. 08/28/22  Charlott Rakes, MD  isosorbide mononitrate (IMDUR) 30 MG 24 hr tablet Take 1 tablet (30 mg total) by mouth daily. 05/24/22 08/22/22  Axel Filler, MD  labetalol (NORMODYNE) 300 MG tablet Take 1 tablet (300 mg total) by mouth 2 (two) times daily. 05/28/22   Charlott Rakes, MD  levothyroxine (SYNTHROID) 100 MCG tablet Take 1 tablet (100 mcg total) by mouth daily. 05/28/22   Charlott Rakes, MD  mometasone-formoterol (DULERA) 100-5 MCG/ACT AERO Inhale 2 puffs into the lungs 2 (two) times daily. 05/28/22   Charlott Rakes, MD  predniSONE (DELTASONE) 20 MG tablet TAKE 2 TABLETS BY MOUTH ONCE DAILY WITH  BREAKFAST 08/27/22   Charlott Rakes, MD  sodium bicarbonate 650 MG tablet Take 1 tablet (650 mg total) by mouth 2 (two) times daily. 06/07/22   Domenic Polite, MD  triamcinolone cream (KENALOG) 0.1 % APPLY  CREAM EXTERNALLY TWICE DAILY 08/28/22   Charlott Rakes, MD    Allergies:   Allergies  Allergen Reactions   Naproxen Other (See Comments)    Unknown reaction    Shellfish Allergy Itching    Social History:  reports that he quit smoking about 11 years ago. His smoking use included cigarettes. He smoked an average of .1 packs per day. He has never used smokeless tobacco. He reports that he does not drink alcohol and does not use drugs.  Family History: Family History  Problem Relation Age of Onset   Hypertension Mother    Diabetes Mother    Colon cancer Neg Hx     Physical Exam: Vitals:   09/20/22 1715 09/20/22 1745 09/20/22 2207 09/20/22 2215  BP: (!) 143/87   (!) 165/104  Pulse: 78   88  Resp: 16   (!) 24  Temp:  98.2 F (36.8 C) 98.2 F (36.8 C) 97.9 F (36.6 C)  TempSrc:  Oral Oral Oral  SpO2: 100%   100%  Weight:      Height:        General:  Alert and oriented times three, well developed and nourished, no acute distress Eyes: PERRLA, pink conjunctiva, no scleral icterus ENT: Moist oral mucosa, neck supple, no thyromegaly, + JVD Lungs: clear to ascultation, no wheeze, no crackles, no use of accessory muscles Cardiovascular: regular rate and rhythm, no regurgitation, no gallops, no murmurs. No carotid bruits, no JVD Abdomen: soft, positive BS, non-tender, non-distended, no organomegaly, not an acute abdomen GU: not examined Neuro: CN II - XII grossly intact, sensation intact Musculoskeletal: strength 5/5 all extremities, no clubbing, cyanosis. >3+ B/L pitting edema Skin: no rash, no subcutaneous crepitation, no decubitus Psych: appropriate patient   Labs on Admission:  Recent Labs    09/20/22 1334  NA 139  K 4.2  CL 109  CO2 20*  GLUCOSE 113*  BUN  93*  CREATININE 8.28*  CALCIUM 7.4*    Recent Labs    09/20/22 1334  WBC 10.7*  HGB 7.9*  HCT 23.4*  MCV 73.4*  PLT 187     Radiological Exams on Admission: DG Chest 2 View  Result Date: 09/20/2022 CLINICAL DATA:  Shortness of breath EXAM: CHEST - 2 VIEW COMPARISON:  05/23/2022, 11/28/2021 FINDINGS: Cardiac silhouette is enlarged, increased in diameter from the most recent comparison study of 05/23/2022, although similar in size to 11/28/2021. Pulmonary vascular congestion. Increased interstitial markings bilaterally with more prominent interstitial opacities in the right lower lobe and right upper lobe. No pleural effusion or pneumothorax. IMPRESSION: 1. Findings suggest CHF with interstitial edema. 2. More  prominent interstitial opacities in the right lower lobe and right upper lobe, likely asymmetric edema. Superimposed infection not excluded. 3. Cardiac silhouette has increased in size compared to the most recent chest x-ray. Underlying pericardial effusion is not excluded. Electronically Signed   By: Davina Poke D.O.   On: 09/20/2022 14:08    Assessment/Plan Present on Admission:  Fluid overload -Admit to med telemetry -CHF order set initiated -Strict I's and O's, daily weights -IV Lasix is 80 mg every 12 -Patient with orthopnea and symptoms of CHF but this is likely combination of fluid overload and congestive heart failure.  Most recent echo 11/29/2021.  EF 60 to 65%.  Echo not repeated -Oxygen if needed -Nebulizers as needed   Essential hypertension -Stable, labetalol, Norvasc resumed  CAD -Stable, Imdur, atorvastatin, Norvasc, labetalol resumed   OSA (obstructive sleep apnea) -CPAP ordered curiously   COPD (chronic obstructive pulmonary disease) ([facility]) -Stable, Dulera and nebulizers resumed  Hypothyroidism -Stable, Synthroid resumed  Gout -Stable, allopurinol resumed   CKD (chronic kidney disease) stage 5, GFR less than 15 ml/min ([facility]) -Avoid  nephrotoxic medications.  IV Lasix could worsen creatinine -Nephrology consult in a.m.  -Strict I's and O's -Sodium bicarb and Rocaltrol resumed   Hyperlipidemia -Stable, atorvastatin resumed   Boe Deans 09/20/2022, 10:47 PM

## 2022-09-20 NOTE — ED Provider Notes (Addendum)
[facility] EMERGENCY DEPARTMENT Provider Note   CSN: [id number] Arrival date & time: 09/20/22  1212     History  Chief Complaint  Patient presents with   Shortness of Breath    Johnny [name] is a 61 year old male with a history of CKD, HTN, OSA, HFpEF (EF 60-65% in Jan 2023) who presents to the emergency department for shortness of breath.  The patient states that this has been progressively worsening over the last 1 week.  He reports that he does have a history of COPD, and has been using his albuterol inhaler a bit more at home without relief.  He reports a cough that she started today, but no fever.  He has noticed increased swelling in both of his legs, and has gained a few pounds over the last week as well.  He states that his weight had been down to 180 pounds.  He states that he takes Lasix once a day, missed only 1 dose recently.  He is not on dialysis.  He denies any chest pain or other symptoms.  The history is provided by the patient and medical records.  Shortness of Breath      Home Medications Prior to Admission medications   Medication Sig Start Date End Date Taking? Authorizing Provider  acetaminophen (TYLENOL) 325 MG tablet Take 2 tablets (650 mg total) by mouth every 6 (six) hours as needed for moderate pain or mild pain. 08/06/22  Yes Rising, Wells Guiles, PA-C  albuterol (VENTOLIN HFA) 108 (90 Base) MCG/ACT inhaler Inhale 2 puffs into the lungs every 6 (six) hours as needed for wheezing or shortness of breath. 12/01/21  Yes Ghimire, Dante Gang, MD  allopurinol (ZYLOPRIM) 100 MG tablet Take 1 tablet (100 mg total) by mouth daily. 05/28/22  Yes Charlott Rakes, MD  amLODipine (NORVASC) 10 MG tablet Take 1 tablet (10 mg total) by mouth daily. 05/28/22  Yes Charlott Rakes, MD  atorvastatin (LIPITOR) 40 MG tablet Take 1 tablet (40 mg total) by mouth daily. 05/28/22  Yes Charlott Rakes, MD  calcitRIOL (ROCALTROL) 0.5 MCG capsule Take 0.5 mcg by mouth daily. 05/03/22   Yes [provider]  furosemide (LASIX) 40 MG tablet Take 1 tablet (40 mg total) by mouth 2 (two) times daily. 06/07/22  Yes Domenic Polite, MD  ipratropium-albuterol (DUONEB) 0.5-2.5 (3) MG/3ML SOLN Inhale 3 mLs into the lungs every 6 (six) hours as needed. 08/28/22  Yes Charlott Rakes, MD  isosorbide mononitrate (IMDUR) 30 MG 24 hr tablet Take 1 tablet (30 mg total) by mouth daily. 05/24/22 09/20/22 Yes Axel Filler, MD  labetalol (NORMODYNE) 300 MG tablet Take 1 tablet (300 mg total) by mouth 2 (two) times daily. 05/28/22  Yes Charlott Rakes, MD  levothyroxine (SYNTHROID) 100 MCG tablet Take 1 tablet (100 mcg total) by mouth daily. 05/28/22  Yes Newlin, Charlane Ferretti, MD  mometasone-formoterol (DULERA) 100-5 MCG/ACT AERO Inhale 2 puffs into the lungs 2 (two) times daily. 05/28/22  Yes Newlin, Charlane Ferretti, MD  predniSONE (DELTASONE) 20 MG tablet TAKE 2 TABLETS BY MOUTH ONCE DAILY WITH BREAKFAST 08/27/22  Yes Newlin, Enobong, MD  sodium bicarbonate 650 MG tablet Take 1 tablet (650 mg total) by mouth 2 (two) times daily. 06/07/22  Yes Domenic Polite, MD  triamcinolone cream (KENALOG) 0.1 % APPLY  CREAM EXTERNALLY TWICE DAILY 08/28/22  Yes Newlin, Charlane Ferretti, MD  cyclobenzaprine (FEXMID) 7.5 MG tablet Take 1 tablet (7.5 mg total) by mouth 2 (two) times daily as needed for muscle spasms. Patient not taking:  Reported on 09/20/2022 08/06/22   Rising, Wells Guiles, PA-C      Allergies    Naproxen and Shellfish allergy    Review of Systems   Review of Systems  Respiratory:  Positive for shortness of breath.     Physical Exam Updated Vital Signs BP (!) 165/104 (BP Location: Right Arm)   Pulse 88   Temp 97.9 F (36.6 C) (Oral)   Resp (!) 24   Ht '6\' 1"'$  (1.854 m)   Wt 90.7 kg   SpO2 100%   BMI 26.39 kg/m   Physical Exam Vitals and nursing note reviewed.  Constitutional:      Appearance: He is not toxic-appearing.  HENT:     Head: Normocephalic and atraumatic.  Cardiovascular:     Rate  and Rhythm: Normal rate and regular rhythm.  Pulmonary:     Effort: Tachypnea (Mild) present.     Comments: Speaking in short sentences.  Diffuse fine wheezes. Abdominal:     Palpations: Abdomen is soft.  Musculoskeletal:     Cervical back: Normal range of motion.     Right lower leg: Edema present.     Left lower leg: Edema present.     Comments: 1+ lower extremity edema bilaterally to knee.  No overlying erythema or warmth.  Skin:    General: Skin is warm and dry.  Neurological:     General: No focal deficit present.     Mental Status: He is alert and oriented to person, place, and time.     ED Results / Procedures / Treatments   Labs (all labs ordered are listed, but only abnormal results are displayed) Labs Reviewed  CBC - Abnormal; Notable for the following components:      Result Value   WBC 10.7 (*)    RBC 3.19 (*)    Hemoglobin 7.9 (*)    HCT 23.4 (*)    MCV 73.4 (*)    MCH 24.8 (*)    RDW 17.9 (*)    All other components within normal limits  BASIC METABOLIC PANEL - Abnormal; Notable for the following components:   CO2 20 (*)    Glucose, Bld 113 (*)    BUN 93 (*)    Creatinine, Ser 8.28 (*)    Calcium 7.4 (*)    GFR, Estimated 7 (*)    All other components within normal limits  BRAIN NATRIURETIC PEPTIDE - Abnormal; Notable for the following components:   B Natriuretic Peptide 1,655.8 (*)    All other components within normal limits  URINALYSIS, ROUTINE W REFLEX MICROSCOPIC    EKG None  Radiology DG Chest 2 View  Result Date: 09/20/2022 CLINICAL DATA:  Shortness of breath EXAM: CHEST - 2 VIEW COMPARISON:  05/23/2022, 11/28/2021 FINDINGS: Cardiac silhouette is enlarged, increased in diameter from the most recent comparison study of 05/23/2022, although similar in size to 11/28/2021. Pulmonary vascular congestion. Increased interstitial markings bilaterally with more prominent interstitial opacities in the right lower lobe and right upper lobe. No pleural  effusion or pneumothorax. IMPRESSION: 1. Findings suggest CHF with interstitial edema. 2. More prominent interstitial opacities in the right lower lobe and right upper lobe, likely asymmetric edema. Superimposed infection not excluded. 3. Cardiac silhouette has increased in size compared to the most recent chest x-ray. Underlying pericardial effusion is not excluded. Electronically Signed   By: Davina Poke D.O.   On: 09/20/2022 14:08    Procedures Ultrasound ED Echo  Date/Time: 09/20/2022 10:49 PM  Performed by: Chari Manning,  Oley Balm, MD Authorized by: Elnora Morrison, MD   Procedure details:    Indications: dyspnea     Views: subxiphoid, parasternal long axis view, parasternal short axis view, apical 4 chamber view and IVC view     Images: archived   Findings:    Pericardium comment:  Trace pericardial effusion   LV Function: normal (>50% EF)     RV Diameter: normal   Impression:    Impression comment:  Normal EF. Trace pericardial effusion without evidence of tamponade physiology.     Medications Ordered in ED Medications  allopurinol (ZYLOPRIM) tablet 100 mg (has no administration in time range)  amLODipine (NORVASC) tablet 10 mg (has no administration in time range)  atorvastatin (LIPITOR) tablet 40 mg (has no administration in time range)  calcitRIOL (ROCALTROL) capsule 0.5 mcg (has no administration in time range)  ipratropium-albuterol (DUONEB) 0.5-2.5 (3) MG/3ML nebulizer solution 3 mL (has no administration in time range)  isosorbide mononitrate (IMDUR) 24 hr tablet 30 mg (has no administration in time range)  labetalol (NORMODYNE) tablet 300 mg (has no administration in time range)  levothyroxine (SYNTHROID) tablet 100 mcg (has no administration in time range)  mometasone-formoterol (DULERA) 100-5 MCG/ACT inhaler 2 puff (has no administration in time range)  sodium bicarbonate tablet 650 mg (has no administration in time range)  furosemide (LASIX) injection 60 mg (60 mg  Intravenous Given 09/20/22 2221)  ipratropium-albuterol (DUONEB) 0.5-2.5 (3) MG/3ML nebulizer solution 3 mL (3 mLs Nebulization Given 09/20/22 2219)  predniSONE (DELTASONE) tablet 60 mg (60 mg Oral Given 09/20/22 2229)  ipratropium-albuterol (DUONEB) 0.5-2.5 (3) MG/3ML nebulizer solution 3 mL (3 mLs Nebulization Given 09/20/22 2237)    ED Course/ Medical Decision Making/ A&P                           Medical Decision Making Amount and/or Complexity of Data Reviewed External Data Reviewed: labs and notes. Labs: ordered. Decision-making details documented in ED Course. Radiology: ordered and independent interpretation performed. Decision-making details documented in ED Course. ECG/medicine tests: ordered and independent interpretation performed. Decision-making details documented in ED Course.  Risk Prescription drug management. Decision regarding hospitalization.   Brief Overview TRISTEN LUCE is a 61 y.o. male who presents with shortness of breath as per above.  I have reviewed the nursing documentation for past medical history, family history, and social history and agree.  I have reviewed the patient's vital signs. There are no significant abnormalities.  Initial Differential Diagnoses: I am primarily concerned for CHF exacerbation given increased weight gain and lower extremity edema, as well as fine wheeze which is likely a fluid wheeze.  Given history of COPD and wheezing however, I am concerned for possible COPD exacerbation as well.  Patient does report new cough, but no fever, no focal findings on lung auscultation to suggest pneumonia, however will further evaluate with chest x-ray. The patient has no history of anaphylaxis, no urticaria or edema on exam, and no hypotension to suggest anaphylaxis.  Differential also included ACS, but given patient's lack of history, unremarkable ECG, and absence of chest pain, I feel this is unlikely.  Feel pulmonary embolism unlikely as patient  without hypoxia, tachycardia, history of DVT/PE, recent surgery, hemoptysis, or clinical signs of DVT on exam.  Testing: EKG, CBC, BMP, BNP, chest x-ray have been ordered to evaluate the patient.  Testing Results: Labs revealed: CBC with WBC 10.7, hemoglobin 7.9 which does represent an acute drop from 10.5 if three  months ago.  On further questioning, patient denies any melena, hematochezia, hemoptysis, hematemesis, or other bleeding.  May be secondary to occult GI bleed versus chronic kidney disease.  BMP with increased creatinine to 8.28, baseline around 7.4.  BNP elevated to 1655, up from most recent prior of 163 four months ago.  Imaging revealed: CXR on my independent review as well as review by radiology demonstrates evidence of pulmonary edema with bilateral increased interstitial markings, enlarged cardiac silhouette.  Bedside echocardiogram performed by myself, demonstrating trace pericardial effusion without signs of tamponade physiology, preserved EF.  I interpreted the ECG. It reveals a normal sinus rhythm, 77 bpm.  There is T wave flattening/inversions in leads V4 through V6 which does appear new from prior, but no ST elevations or depressions to suggest acute ischemia.  No high-grade conduction block or arrhythmia.    Patient ordered for 60 mg IV Lasix, DuoNeb's x2 in ED, prednisone 60 mg.  Wheezing did improve after administration of DuoNeb's.  Patient has not required supplemental oxygen, but is mildly tachypneic on exam and speaking in short sentences.  Given evidence of significant volume overload, feel patient warrants admission for further management.  Final Differential: The patient's presentation is most consistent with mixed COPD exacerbation/CHF exacerbation.  Disposition: I believe the patient requires admission for further care and management. The patient was admitted to Hospitalist.      Final Clinical Impression(s) / ED Diagnoses Final diagnoses:  Acute on  chronic congestive heart failure, unspecified heart failure type ([place])  COPD exacerbation [facility])    Rx / DC Orders ED Discharge Orders     None          Renard Matter, MD 09/20/22 2335    Elnora Morrison, MD 09/21/22 0040

## 2022-09-21 ENCOUNTER — Inpatient Hospital Stay (HOSPITAL_COMMUNITY): Payer: Medicaid Other

## 2022-09-21 DIAGNOSIS — E8779 Other fluid overload: Secondary | ICD-10-CM | POA: Diagnosis not present

## 2022-09-21 DIAGNOSIS — E7800 Pure hypercholesterolemia, unspecified: Secondary | ICD-10-CM | POA: Diagnosis not present

## 2022-09-21 DIAGNOSIS — I10 Essential (primary) hypertension: Secondary | ICD-10-CM | POA: Diagnosis not present

## 2022-09-21 DIAGNOSIS — I5033 Acute on chronic diastolic (congestive) heart failure: Secondary | ICD-10-CM

## 2022-09-21 DIAGNOSIS — I132 Hypertensive heart and chronic kidney disease with heart failure and with stage 5 chronic kidney disease, or end stage renal disease: Secondary | ICD-10-CM | POA: Diagnosis not present

## 2022-09-21 DIAGNOSIS — N185 Chronic kidney disease, stage 5: Secondary | ICD-10-CM | POA: Diagnosis not present

## 2022-09-21 DIAGNOSIS — J441 Chronic obstructive pulmonary disease with (acute) exacerbation: Secondary | ICD-10-CM | POA: Diagnosis not present

## 2022-09-21 DIAGNOSIS — R0602 Shortness of breath: Secondary | ICD-10-CM | POA: Diagnosis not present

## 2022-09-21 DIAGNOSIS — G4733 Obstructive sleep apnea (adult) (pediatric): Secondary | ICD-10-CM | POA: Diagnosis not present

## 2022-09-21 DIAGNOSIS — M1A00X0 Idiopathic chronic gout, unspecified site, without tophus (tophi): Secondary | ICD-10-CM | POA: Diagnosis not present

## 2022-09-21 DIAGNOSIS — E039 Hypothyroidism, unspecified: Secondary | ICD-10-CM | POA: Diagnosis not present

## 2022-09-21 DIAGNOSIS — D649 Anemia, unspecified: Secondary | ICD-10-CM | POA: Diagnosis not present

## 2022-09-21 LAB — ECHOCARDIOGRAM COMPLETE
AR max vel: 2.67 cm2
AV Area VTI: 2.86 cm2
AV Area mean vel: 2.5 cm2
AV Mean grad: 7 mmHg
AV Peak grad: 10.5 mmHg
Ao pk vel: 1.62 m/s
Area-P 1/2: 5.16 cm2
Calc EF: 57.2 %
Height: 73 in
MV M vel: 4.53 m/s
MV Peak grad: 81.9 mmHg
S' Lateral: 3.8 cm
Single Plane A2C EF: 59.2 %
Single Plane A4C EF: 51.3 %
Weight: 3200 oz

## 2022-09-21 LAB — CBC WITH DIFFERENTIAL/PLATELET
Abs Immature Granulocytes: 0.15 10*3/uL — ABNORMAL HIGH (ref 0.00–0.07)
Basophils Absolute: 0 10*3/uL (ref 0.0–0.1)
Basophils Relative: 0 %
Eosinophils Absolute: 0.1 10*3/uL (ref 0.0–0.5)
Eosinophils Relative: 1 %
HCT: 25.3 % — ABNORMAL LOW (ref 39.0–52.0)
Hemoglobin: 8.7 g/dL — ABNORMAL LOW (ref 13.0–17.0)
Immature Granulocytes: 1 %
Lymphocytes Relative: 8 %
Lymphs Abs: 0.9 10*3/uL (ref 0.7–4.0)
MCH: 25.2 pg — ABNORMAL LOW (ref 26.0–34.0)
MCHC: 34.4 g/dL (ref 30.0–36.0)
MCV: 73.3 fL — ABNORMAL LOW (ref 80.0–100.0)
Monocytes Absolute: 0.3 10*3/uL (ref 0.1–1.0)
Monocytes Relative: 3 %
Neutro Abs: 9.1 10*3/uL — ABNORMAL HIGH (ref 1.7–7.7)
Neutrophils Relative %: 87 %
Platelets: 201 10*3/uL (ref 150–400)
RBC: 3.45 MIL/uL — ABNORMAL LOW (ref 4.22–5.81)
RDW: 18.4 % — ABNORMAL HIGH (ref 11.5–15.5)
WBC: 10.5 10*3/uL (ref 4.0–10.5)
nRBC: 0 % (ref 0.0–0.2)

## 2022-09-21 LAB — BASIC METABOLIC PANEL
Anion gap: 11 (ref 5–15)
BUN: 95 mg/dL — ABNORMAL HIGH (ref 6–20)
CO2: 19 mmol/L — ABNORMAL LOW (ref 22–32)
Calcium: 8 mg/dL — ABNORMAL LOW (ref 8.9–10.3)
Chloride: 108 mmol/L (ref 98–111)
Creatinine, Ser: 8.2 mg/dL — ABNORMAL HIGH (ref 0.61–1.24)
GFR, Estimated: 7 mL/min — ABNORMAL LOW (ref >60)
Glucose, Bld: 141 mg/dL — ABNORMAL HIGH (ref 70–99)
Potassium: 4.6 mmol/L (ref 3.5–5.1)
Sodium: 138 mmol/L (ref 135–145)

## 2022-09-21 LAB — MAGNESIUM: Magnesium: 1.9 mg/dL (ref 1.7–2.4)

## 2022-09-21 MED ORDER — METOLAZONE 5 MG PO TABS
5.0000 mg | ORAL_TABLET | Freq: Once | ORAL | Status: AC
  Administered 2022-09-21: 5 mg via ORAL
  Filled 2022-09-21 (×2): qty 1

## 2022-09-21 MED ORDER — FUROSEMIDE 10 MG/ML IJ SOLN
80.0000 mg | Freq: Three times a day (TID) | INTRAMUSCULAR | Status: DC
  Administered 2022-09-21 – 2022-09-22 (×2): 80 mg via INTRAVENOUS
  Filled 2022-09-21 (×2): qty 8

## 2022-09-21 MED ORDER — FUROSEMIDE 10 MG/ML IJ SOLN
80.0000 mg | Freq: Two times a day (BID) | INTRAMUSCULAR | Status: DC
  Administered 2022-09-21: 80 mg via INTRAVENOUS
  Filled 2022-09-21: qty 8

## 2022-09-21 NOTE — Assessment & Plan Note (Addendum)
Patient with progressive renal disease, serum cr has been stable at 9 level.   Patient was placed on aggressive diuresis with furosemide IV and received one dose of oral metolazone. Negative fluid balance was achieved -11,929 ml, with significant improvement in his symptoms.  Had IV albumin x2   At the time of his discharge his serum cr is 9.5 with K at 3,6 and serum bicarbonate at 25. BUN 126, anion gap is 17   Transitioned to oral torsemide 100 mg daily.  Functioning left upper extremity fistula, created in 2013   Metabolic bone disease, continue with calcitriol.  Anemia of chronic renal disease continue with EPO.  For metabolic acidosis continue with oral sodium bicarbonate   Follow up renal function and electrolytes as outpatient.

## 2022-09-21 NOTE — Progress Notes (Signed)
  Progress Note   Patient: Johnny [name] VEH:[id number] DOB: 1961/02/21 DOA: 09/20/2022     1 DOS: the patient was seen and examined on 09/21/2022   Brief hospital course: Johnny [name] was admitted to the hospital with the working diagnosis of volume overload.   61 yo male with the past medical history of hypertension, hepatitis C, CKD stage V (AV fistula in place), COPD and heart failure who presented with dyspnea. Patient reported 2 weeks of worsening dyspnea, to the point where he is symptomatic with minimal efforts, positive orthopnea and lower extremity edema. Positive weight gain over last 6 months. On his initial physical examination is blood pressure was 143/87, HR 78, RR 16 and 02 saturation 100%, positive JVD, lungs with no wheezing, or rales, heart with S1 and S2 present and rhythmic, abdomen with no distention, positive lower extremity edema +++ pitting.   NA 138, K 4,2 CL 109 bicarbonate 20, glucose 113 bun 93 cr 8,28  BNP 1,655  Wbc 10.7 hgb 7.9 plt 187   Chest radiograph with mild cardiomegaly, symmetric bilateral interstitial infiltrates with fluid in the right fissure.   EKG 77 bpm, normal axis and normal intervals, sinus rhythm with no significant ST segment, negative T waves V4 to V6.   Patient was placed on high doses of furosemide for diuresis.   Assessment and Plan: * CKD (chronic kidney disease) stage 5, GFR less than 15 ml/min (HCC) Patient continue with volume overload His base serum cr is around high 7 and now is low 8  K 4,6 and bicarbonate 19.  Plan to increase furosemide to 80 mg IV q 8hrs Add one dose of oral metolazone Consult nephrology for progressive renal disease, possible initiation of renal replacement therapy.  Functioning left upper extremity fistula, created in 2013    Acute on chronic diastolic CHF (congestive heart failure) ([place]) Patient continue with volume overload Acute pulmonary edema due to progressive renal failure.  Blood pressure is  150 to 836 mmHg systolic.   Plan to continue diuresis with IV furosemide and oral metolazone   Essential hypertension Continue close blood pressure monitoring Continue with isosorbide, amlodipine and labetalol Diuresis with furosemide and metolazone   Hyperlipidemia Continue with statin therapy   Hypothyroidism Levothyroxine   Gout No acute flare   COPD (chronic obstructive pulmonary disease) (HCC) No clinical signs of exacerbation         Subjective: Patient with improvement in dyspnea but not back to baseline, no chest pain   Physical Exam: Vitals:   09/21/22 1030 09/21/22 1202 09/21/22 1330 09/21/22 1345  BP: (!) 150/86  (!) 138/92 (!) 142/86  Pulse: 83  76 75  Resp: (!) 22  (!) 25 18  Temp:  97.6 F (36.4 C)    TempSrc:  Oral    SpO2: 100%  98% 100%  Weight:      Height:       Neurology awake and alert ENT with mild pallor Cardiovascular with S1 and S2 present and rhythmic, no gallops or murmurs Positive JVD Positive lower extremity edema ++ pitting  Respiratory with expiratory wheezing bilaterally  Abdomen with no distention  Data Reviewed:    Family Communication: no family at the bedside   Disposition: Status is: Inpatient Remains inpatient appropriate because: renal failure and volume overload   Planned Discharge Destination: Home      Author: Tawni Millers, MD 09/21/2022 2:37 PM  For on call review [email or web address].

## 2022-09-21 NOTE — Assessment & Plan Note (Signed)
No clinical signs of exacerbation

## 2022-09-21 NOTE — Hospital Course (Signed)
Johnny [name] was admitted to the hospital with the working diagnosis of volume overload.   61 yo male with the past medical history of hypertension, hepatitis C, CKD stage V (AV fistula in place), COPD and heart failure who presented with dyspnea. Patient reported 2 weeks of worsening dyspnea, to the point where he is symptomatic with minimal efforts, positive orthopnea and lower extremity edema. Positive weight gain over last 6 months. On his initial physical examination is blood pressure was 143/87, HR 78, RR 16 and 02 saturation 100%, positive JVD, lungs with no wheezing, or rales, heart with S1 and S2 present and rhythmic, abdomen with no distention, positive lower extremity edema +++ pitting.   NA 138, K 4,2 CL 109 bicarbonate 20, glucose 113 bun 93 cr 8,28  BNP 1,655  Wbc 10.7 hgb 7.9 plt 187   Chest radiograph with mild cardiomegaly, symmetric bilateral interstitial infiltrates with fluid in the right fissure.   EKG 77 bpm, normal axis and normal intervals, sinus rhythm with no significant ST segment, negative T waves V4 to V6.   Patient was placed on high doses of furosemide for diuresis.   11/19 volume has been improving, but continue with significant volume overload. His BUN continue to rise.  Patient may need to start on renal replacement therapy on this hospitalization.  11/20 volume status continue to improve with diuresis but serum cr not yet stable and continue to have high BUN.

## 2022-09-21 NOTE — Progress Notes (Incomplete)
  Echocardiogram 2D Echocardiogram has been performed.  Johnny [name] 09/21/2022, 9:31 AM

## 2022-09-21 NOTE — Assessment & Plan Note (Signed)
Continue with statin therapy.  ?

## 2022-09-21 NOTE — Progress Notes (Signed)
Heart Failure Navigator Progress Note  Assessed for Heart & Vascular TOC clinic readiness.  Patient does not meet criteria due to CKD V, Scr 8.0.   Navigator will sign off at this time. Earnestine Leys, BSN, RN Heart Failure Transport planner Only

## 2022-09-21 NOTE — Assessment & Plan Note (Signed)
Positive acute gout flare on right foot  Patient will continue taking allopurinol and will add prednisone 20 mg for the next 3 days.  Follow up as outpatient.

## 2022-09-21 NOTE — Assessment & Plan Note (Signed)
Levothyroxine

## 2022-09-21 NOTE — Consult Note (Addendum)
[facility] Nephrology Consultation Note  Requesting MD: Dr. Cathlean Sauer, Riccardo Dubin Reason for consult: CKD5 with fluid overload.   HPI:  Johnny [name] is a 61 y.o. male with history of hypertension, OSA, hep C, COPD, CHF, CKD stage V with baseline serum creatinine level around 7-8, presented with worsening shortness of breath for about a week, seen as a consultation for the evaluation of CKD 5 with fluid overload. The patient has longstanding CKD and follows with Dr. Joylene Grapes at [facility]. He was last seen in 03/2022 when serum creatinine level was 7.61, PTH 245.  Apparently the patient has left upper extremity AV fistula since 2013 and it was never used. On arrival to the ER, the blood pressure was normal, in room air afebrile.  Chest x-ray with CHF and interstitial edema.  The labs showed BUN 93, creatinine level 8.20, potassium 4.2, BNP 1655, hemoglobin 7.9.  He was admitted for CHF exacerbation and treated with IV Lasix.  He already had a urine output of 1000 cc and reportedly his breathing is much better.  He denies nausea, vomiting, dysgeusia, chest pain, headache or dizziness.  Patient said lately he has been receiving Lasix as he was not watching his diet.  He was not restricting his fluid intake as well.  PMHx:   Past Medical History:  Diagnosis Date   Adenomatous colon polyp    tubular   CHF (congestive heart failure) (HCC)    Chronic kidney disease    COPD (chronic obstructive pulmonary disease) ([place])    Diverticulosis    External otitis of right ear 11/10/2018   Hepatitis C    Hypertension    OSA (obstructive sleep apnea) 11/03/2015   Strep throat 01/2021    Past Surgical History:  Procedure Laterality Date   AV FISTULA PLACEMENT  09/12/2009   Left arm AVF    Family Hx:  Family History  Problem Relation Age of Onset   Hypertension Mother    Diabetes Mother    Colon cancer Neg Hx     Social History:  reports that he quit smoking about 11 years ago. His smoking  use included cigarettes. He smoked an average of .1 packs per day. He has never used smokeless tobacco. He reports that he does not drink alcohol and does not use drugs.  Allergies:  Allergies  Allergen Reactions   Naproxen Other (See Comments)    Unknown reaction    Shellfish Allergy Itching    Medications: Prior to Admission medications   Medication Sig Start Date End Date Taking? Authorizing Provider  acetaminophen (TYLENOL) 325 MG tablet Take 2 tablets (650 mg total) by mouth every 6 (six) hours as needed for moderate pain or mild pain. 08/06/22  Yes Rising, Wells Guiles, PA-C  albuterol (VENTOLIN HFA) 108 (90 Base) MCG/ACT inhaler Inhale 2 puffs into the lungs every 6 (six) hours as needed for wheezing or shortness of breath. 12/01/21  Yes Ghimire, Dante Gang, MD  allopurinol (ZYLOPRIM) 100 MG tablet Take 1 tablet (100 mg total) by mouth daily. 05/28/22  Yes Charlott Rakes, MD  amLODipine (NORVASC) 10 MG tablet Take 1 tablet (10 mg total) by mouth daily. 05/28/22  Yes Charlott Rakes, MD  atorvastatin (LIPITOR) 40 MG tablet Take 1 tablet (40 mg total) by mouth daily. 05/28/22  Yes Charlott Rakes, MD  calcitRIOL (ROCALTROL) 0.5 MCG capsule Take 0.5 mcg by mouth daily. 05/03/22  Yes [provider]  furosemide (LASIX) 40 MG tablet Take 1 tablet (40 mg total) by mouth 2 (  two) times daily. 06/07/22  Yes Domenic Polite, MD  ipratropium-albuterol (DUONEB) 0.5-2.5 (3) MG/3ML SOLN Inhale 3 mLs into the lungs every 6 (six) hours as needed. 08/28/22  Yes Charlott Rakes, MD  isosorbide mononitrate (IMDUR) 30 MG 24 hr tablet Take 1 tablet (30 mg total) by mouth daily. 05/24/22 09/20/22 Yes Axel Filler, MD  labetalol (NORMODYNE) 300 MG tablet Take 1 tablet (300 mg total) by mouth 2 (two) times daily. 05/28/22  Yes Charlott Rakes, MD  levothyroxine (SYNTHROID) 100 MCG tablet Take 1 tablet (100 mcg total) by mouth daily. 05/28/22  Yes Newlin, Charlane Ferretti, MD  mometasone-formoterol (DULERA) 100-5  MCG/ACT AERO Inhale 2 puffs into the lungs 2 (two) times daily. 05/28/22  Yes Newlin, Charlane Ferretti, MD  predniSONE (DELTASONE) 20 MG tablet TAKE 2 TABLETS BY MOUTH ONCE DAILY WITH BREAKFAST 08/27/22  Yes Newlin, Enobong, MD  sodium bicarbonate 650 MG tablet Take 1 tablet (650 mg total) by mouth 2 (two) times daily. 06/07/22  Yes Domenic Polite, MD  triamcinolone cream (KENALOG) 0.1 % APPLY  CREAM EXTERNALLY TWICE DAILY 08/28/22  Yes Newlin, Charlane Ferretti, MD  cyclobenzaprine (FEXMID) 7.5 MG tablet Take 1 tablet (7.5 mg total) by mouth 2 (two) times daily as needed for muscle spasms. Patient not taking: Reported on 09/20/2022 08/06/22   Rising, Wells Guiles, PA-C    I have reviewed the patient's current medications.  Labs: Renal Panel: Recent Labs    11/28/21 2104 11/29/21 0252 11/29/21 0536 11/29/21 1104 11/30/21 0447 05/23/22 0759 05/23/22 1045 05/24/22 0341 06/05/22 1852 06/05/22 2355 06/07/22 0508 09/20/22 1334 09/21/22 0347  NA 140  --  140 134* 135 138 137 134* 139 142 132* 139 138  K 5.1  --  5.1 4.9 4.4 3.8 3.9 4.3 4.0 4.2 4.3 4.2 4.6  CL 110  --  110 104 105 104  --  102 110 112* 102 109 108  CO2 21*  --   --  17* 20* 18*  --  17* 19* 15* 18* 20* 19*  GLUCOSE 94  --  165* 165* 113* 101*  --  128* 114* 105* 150* 113* 141*  BUN 42*  --  43* 52* 60* 84*  --  91* 97* 96* 96* 93* 95*  CREATININE 6.04*  --  6.80* 6.03* 6.02* 7.93*  --  8.29* 7.92* 7.43* 7.34* 8.28* 8.20*  CALCIUM 7.8*  --   --  8.2* 7.9* 8.9  --  8.8* 9.0 9.1 8.7* 7.4* 8.0*  MG  --  1.9  --   --   --   --   --   --   --  2.0  --   --  1.9  PHOS  --   --   --   --  4.1  --   --  3.2  --  5.2*  --   --   --   ALBUMIN  --  2.6*  --   --  2.9*  --   --  3.2*  --  3.2*  --   --   --      CBC:    Latest Ref Rng & Units 09/21/2022    3:47 AM 09/20/2022    1:34 PM 06/07/2022    5:08 AM  CBC  WBC 4.0 - 10.5 K/uL 10.5  10.7  28.0   Hemoglobin 13.0 - 17.0 g/dL 8.7  7.9  10.5   Hematocrit 39.0 - 52.0 % 25.3  23.4  30.6   Platelets  150 - 400  K/uL 201  187  230      Anemia Panel:  Recent Labs    11/29/21 1925 05/23/22 0759 06/05/22 1852 06/05/22 2355 06/06/22 0012 06/07/22 0508 09/20/22 1334 09/21/22 0347  HGB  --    < > 10.8* 10.7*  --  10.5* 7.9* 8.7*  MCV  --    < > 76.2* 76.8*  --  74.3* 73.4* 73.3*  FERRITIN 427*  --   --   --  604*  --   --   --   TIBC 293  --   --   --  242*  --   --   --   IRON 51  --   --   --  21*  --   --   --    < > = values in this interval not displayed.    No results for input(s): "AST", "ALT", "ALKPHOS", "BILITOT", "PROT", "ALBUMIN" in the last 168 hours.  Lab Results  Component Value Date   HGBA1C 5.3 05/24/2022    ROS:  Pertinent items noted in HPI and remainder of comprehensive ROS otherwise negative.  Physical Exam: Vitals:   09/21/22 1345 09/21/22 1512  BP: (!) 142/86 (!) 135/93  Pulse: 75 80  Resp: 18 18  Temp:  97.9 F (36.6 C)  SpO2: 100% 99%     General exam: Appears calm and comfortable  Respiratory system: Basal rhonchi, no wheezing and no increased work of breathing.  Cardiovascular system: S1 & S2 heard, RRR.  Bilateral leg pitting edema+. Gastrointestinal system: Abdomen is nondistended, soft and nontender. Normal bowel sounds heard. Central nervous system: Alert and oriented. No focal neurological deficits.  No asterixis Extremities: Symmetric 5 x 5 power.  Edema present, no cyanosis Skin: No rashes, lesions or ulcers Psychiatry: Judgement and insight appear normal. Mood & affect appropriate.  Vascular Access: LUE AVF T/B+  Assessment/Plan:  #CKD stage V with fluid overload, nonoliguric: He presented with shortness of breath and acute pulmonary edema.  The baseline serum creatinine level has been around 7-8 and has left upper extremity fistula since 2013.  He is responding with IV diuretics.  He has no signs or symptoms of uremia.  I agree with continuing IV Lasix and metolazone.  I do not see any urgent need for dialysis at this time.  Once  he is volume optimized, I think we will be able to start dialysis from outpatient.  He has AV fistula matured if needed.  The patient is hesitant to start HD in the hospital as well.  #Acute on chronic diastolic CHF: Echo with EF 55 to 70%, grade 2 diastolic dysfunction.  Responding with IV diuretics.  He is on a furosemide 40 mg twice a day at home.  Shortness of breath is already improving.  #Metabolic acidosis: Continue sodium bicarbonate.  #Hypertension/volume: Continue current antihypertensives include amlodipine, Imdur and diuretics per the volume management.  #Anemia of CKD: Check iron studies in the morning.  May benefit from ESA.  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  Check phosphorus level.  Thank you for the consult.  We will continue to follow.   Kermitt Harjo Tanna Furry 09/21/2022, 3:38 PM  [facility].

## 2022-09-21 NOTE — Assessment & Plan Note (Signed)
Patient continue with volume overload Acute pulmonary edema due to progressive renal failure.  Echocardiogram with preserved LV systolic function with EF 55 to 63%, RV systolic function preserved, small pericardial effusion, no significant valvular disease.   Continue diuresis with IV furosemide.  Had one dose of metolazone yesterday.  Patient may need renal replacement therapy during this hospitalization

## 2022-09-21 NOTE — ED Notes (Signed)
Patient refused heparin injection. Patient informed of the clinical indication for the medication and patient reports "I am a flipper, I flip all night long." Patient ambulates around the room.

## 2022-09-21 NOTE — Assessment & Plan Note (Addendum)
Continue with isosorbide, amlodipine and labetalol Diuresis with torsemide

## 2022-09-22 DIAGNOSIS — I5033 Acute on chronic diastolic (congestive) heart failure: Secondary | ICD-10-CM | POA: Diagnosis not present

## 2022-09-22 DIAGNOSIS — M1A00X0 Idiopathic chronic gout, unspecified site, without tophus (tophi): Secondary | ICD-10-CM | POA: Diagnosis not present

## 2022-09-22 DIAGNOSIS — E7800 Pure hypercholesterolemia, unspecified: Secondary | ICD-10-CM | POA: Diagnosis not present

## 2022-09-22 DIAGNOSIS — J441 Chronic obstructive pulmonary disease with (acute) exacerbation: Secondary | ICD-10-CM | POA: Diagnosis not present

## 2022-09-22 DIAGNOSIS — N185 Chronic kidney disease, stage 5: Secondary | ICD-10-CM | POA: Diagnosis not present

## 2022-09-22 DIAGNOSIS — I132 Hypertensive heart and chronic kidney disease with heart failure and with stage 5 chronic kidney disease, or end stage renal disease: Secondary | ICD-10-CM | POA: Diagnosis not present

## 2022-09-22 DIAGNOSIS — E039 Hypothyroidism, unspecified: Secondary | ICD-10-CM | POA: Diagnosis not present

## 2022-09-22 DIAGNOSIS — I10 Essential (primary) hypertension: Secondary | ICD-10-CM | POA: Diagnosis not present

## 2022-09-22 DIAGNOSIS — R0602 Shortness of breath: Secondary | ICD-10-CM | POA: Diagnosis not present

## 2022-09-22 DIAGNOSIS — D649 Anemia, unspecified: Secondary | ICD-10-CM | POA: Diagnosis not present

## 2022-09-22 DIAGNOSIS — G4733 Obstructive sleep apnea (adult) (pediatric): Secondary | ICD-10-CM | POA: Diagnosis not present

## 2022-09-22 DIAGNOSIS — E8779 Other fluid overload: Secondary | ICD-10-CM | POA: Diagnosis not present

## 2022-09-22 LAB — IRON AND TIBC
Iron: 80 ug/dL (ref 45–182)
Saturation Ratios: 28 % (ref 17.9–39.5)
TIBC: 290 ug/dL (ref 250–450)
UIBC: 210 ug/dL

## 2022-09-22 LAB — RENAL FUNCTION PANEL
Albumin: 2.9 g/dL — ABNORMAL LOW (ref 3.5–5.0)
Anion gap: 14 (ref 5–15)
BUN: 113 mg/dL — ABNORMAL HIGH (ref 6–20)
CO2: 18 mmol/L — ABNORMAL LOW (ref 22–32)
Calcium: 8.1 mg/dL — ABNORMAL LOW (ref 8.9–10.3)
Chloride: 104 mmol/L (ref 98–111)
Creatinine, Ser: 8.69 mg/dL — ABNORMAL HIGH (ref 0.61–1.24)
GFR, Estimated: 6 mL/min — ABNORMAL LOW (ref >60)
Glucose, Bld: 138 mg/dL — ABNORMAL HIGH (ref 70–99)
Phosphorus: 3.7 mg/dL (ref 2.5–4.6)
Potassium: 4.1 mmol/L (ref 3.5–5.1)
Sodium: 136 mmol/L (ref 135–145)

## 2022-09-22 LAB — FERRITIN: Ferritin: 558 ng/mL — ABNORMAL HIGH (ref 24–336)

## 2022-09-22 MED ORDER — DARBEPOETIN ALFA 100 MCG/0.5ML IJ SOSY
100.0000 ug | PREFILLED_SYRINGE | Freq: Once | INTRAMUSCULAR | Status: AC
  Administered 2022-09-22: 100 ug via SUBCUTANEOUS
  Filled 2022-09-22: qty 0.5

## 2022-09-22 MED ORDER — SODIUM BICARBONATE 650 MG PO TABS
1300.0000 mg | ORAL_TABLET | Freq: Two times a day (BID) | ORAL | Status: DC
  Administered 2022-09-22 – 2022-09-25 (×7): 1300 mg via ORAL
  Filled 2022-09-22 (×7): qty 2

## 2022-09-22 MED ORDER — FUROSEMIDE 10 MG/ML IJ SOLN
80.0000 mg | Freq: Two times a day (BID) | INTRAMUSCULAR | Status: DC
  Administered 2022-09-22 – 2022-09-23 (×3): 80 mg via INTRAVENOUS
  Filled 2022-09-22 (×3): qty 8

## 2022-09-22 NOTE — Progress Notes (Signed)
Progress Note   Patient: Johnny [name]:[id number] DOB: 1960/11/30 DOA: 09/20/2022     2 DOS: the patient was seen and examined on 09/22/2022   Brief hospital course: Johnny [name] was admitted to the hospital with the working diagnosis of volume overload.   61 yo male with the past medical history of hypertension, hepatitis C, CKD stage V (AV fistula in place), COPD and heart failure who presented with dyspnea. Patient reported 2 weeks of worsening dyspnea, to the point where he is symptomatic with minimal efforts, positive orthopnea and lower extremity edema. Positive weight gain over last 6 months. On his initial physical examination is blood pressure was 143/87, HR 78, RR 16 and 02 saturation 100%, positive JVD, lungs with no wheezing, or rales, heart with S1 and S2 present and rhythmic, abdomen with no distention, positive lower extremity edema +++ pitting.   NA 138, K 4,2 CL 109 bicarbonate 20, glucose 113 bun 93 cr 8,28  BNP 1,655  Wbc 10.7 hgb 7.9 plt 187   Chest radiograph with mild cardiomegaly, symmetric bilateral interstitial infiltrates with fluid in the right fissure.   EKG 77 bpm, normal axis and normal intervals, sinus rhythm with no significant ST segment, negative T waves V4 to V6.   Patient was placed on high doses of furosemide for diuresis.   Assessment and Plan: * CKD (chronic kidney disease) stage 5, GFR less than 15 ml/min (HCC) Patient continue with volume overload, no signs of uremia.  His base serum cr is around high 7 and now is low 8  Today his urine output is 6,734 ml Systolic blood pressure is 130 to 150   Renal function with serum cr at 8,69 with K at 4,1 and serum bicarbonate at 18, BUN 113.  Anion gap 14   Continue diuresis with furosemide to 80 mg IV q 12hrs 11/17 metolazone Functioning left upper extremity fistula, created in 2013  Patient may need to start renal replacement therapy on this hospitalization if no significant volume improvement  with diuretic therapy.   Metabolic bone disease, continue with calcitriol.  Anemia of chronic renal disease continue with EPO.  For metabolic acidosis continue with oral sodium bicarbonate   Acute on chronic diastolic CHF (congestive heart failure) ([place]) Patient continue with volume overload Acute pulmonary edema due to progressive renal failure.  Echocardiogram with preserved LV systolic function with EF 55 to 19%, RV systolic function preserved, small pericardial effusion, no significant valvular disease.   Continue diuresis with IV furosemide.  Had one dose of metolazone yesterday.  Patient may need renal replacement therapy during this hospitalization   Essential hypertension Continue close blood pressure monitoring Continue with isosorbide, amlodipine and labetalol Diuresis with furosemide.    Hyperlipidemia Continue with statin therapy   Hypothyroidism Levothyroxine   Gout No acute flare   COPD (chronic obstructive pulmonary disease) (HCC) No clinical signs of exacerbation         Subjective: patient with persistent dyspnea, has improved but not back to baseline continue with lower extremity edema   Physical Exam: Vitals:   09/21/22 2353 09/22/22 0502 09/22/22 0723 09/22/22 0834  BP: 135/83 (!) 142/93  (!) 150/88  Pulse: 77 86 86 83  Resp:  '20 16 18  '$ Temp:  97.6 F (36.4 C)  (!) 97.5 F (36.4 C)  TempSrc:  Oral  Oral  SpO2: 100% 99%  100%  Weight:  96.9 kg    Height:       Neurology awake and alert,  no tremors ENT with mild pallor Cardiovascular with S1 and S2 present and rhythmic with no gallops, rubs or murmurs No JVD Positive lower extremity edema ++ Respiratory with bilateral wheezing and rales with no rhonchi Abdomen with no distention  Left upper extremity with palpable thrill  Data Reviewed:    Family Communication: Patient's family at the bedside, sleeping at the time of my evaluation   Disposition: Status is: Inpatient Remains  inpatient appropriate because: worsening renal failure and volume overload   Planned Discharge Destination: Home  Author: Tawni Millers, MD 09/22/2022 10:25 AM  For on call review [email or web address].

## 2022-09-22 NOTE — Progress Notes (Signed)
[name] NEPHROLOGY PROGRESS NOTE  Assessment/ Plan: Pt is a 61 y.o. yo male  with history of hypertension, OSA, hep C, COPD, CHF, CKD stage V with baseline serum creatinine level around 7-8, presented with worsening shortness of breath for about a week, seen as a consultation for the evaluation of CKD 5 with fluid overload. He  follows with Dr. Joylene Grapes at [facility].    # CKD stage V with fluid overload, nonoliguric: He presented with shortness of breath and acute pulmonary edema.  The baseline serum creatinine level has been around 7-8 and has left upper extremity fistula since 2013.  Increased urine output with IV diuretics and metolazone however both BUN and creatinine level trending up.  He still has some shortness of breath and peripheral edema.  I will continue IV Lasix but change to twice a day.  I have frankly discussed with the patient that he is very close to starting dialysis probably in the next 1 to 2 days.  He is hesitant to start dialysis and wanted to watch the next couple of days with Lasix.  He has no signs or symptoms of uremia therefore no urgent indication for HD today. He has AV fistula matured if needed.     #Acute on chronic diastolic CHF: Echo with EF 55 to 32%, grade 2 diastolic dysfunction.  Continue IV diuretics as above.     #Metabolic acidosis: Increase sodium bicarbonate dose.   #Hypertension/volume: Continue current antihypertensives include amlodipine, Imdur and diuretics per the volume management.   #Anemia of CKD: Iron saturation 28 and ferritin 558.  I will order a dose of Aranesp.    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  Monitor calcium and phosphorus level.  Subjective: Seen and examined at bedside.  He had a 3.1 L of urine output.  Reports shortness of breath is better but he still windy and dyspnea on exertion.  Denies nausea, vomiting, chest pain, dysgeusia. Objective Vital signs in last 24 hours: Vitals:   09/21/22 2353 09/22/22 0502 09/22/22 0723  09/22/22 0834  BP: 135/83 (!) 142/93  (!) 150/88  Pulse: 77 86 86 83  Resp:  '20 16 18  '$ Temp:  97.6 F (36.4 C)  (!) 97.5 F (36.4 C)  TempSrc:  Oral  Oral  SpO2: 100% 99%  100%  Weight:  96.9 kg    Height:       Weight change: 6.781 kg  Intake/Output Summary (Last 24 hours) at 09/22/2022 0844 Last data filed at 09/22/2022 0528 Gross per 24 hour  Intake 480 ml  Output 2575 ml  Net -2095 ml       Labs: RENAL PANEL Recent Labs    11/29/21 0252 11/29/21 0536 11/29/21 1104 11/30/21 0447 05/23/22 0759 05/23/22 1045 05/24/22 0341 06/05/22 1852 06/05/22 2355 06/07/22 0508 09/20/22 1334 09/21/22 0347 09/22/22 0040  NA  --    < > 134* 135 138 137 134* 139 142 132* 139 138 136  K  --    < > 4.9 4.4 3.8 3.9 4.3 4.0 4.2 4.3 4.2 4.6 4.1  CL  --    < > 104 105 104  --  102 110 112* 102 109 108 104  CO2  --   --  17* 20* 18*  --  17* 19* 15* 18* 20* 19* 18*  GLUCOSE  --    < > 165* 113* 101*  --  128* 114* 105* 150* 113* 141* 138*  BUN  --    < > 52* 60*  84*  --  91* 97* 96* 96* 93* 95* 113*  CREATININE  --    < > 6.03* 6.02* 7.93*  --  8.29* 7.92* 7.43* 7.34* 8.28* 8.20* 8.69*  CALCIUM  --   --  8.2* 7.9* 8.9  --  8.8* 9.0 9.1 8.7* 7.4* 8.0* 8.1*  MG 1.9  --   --   --   --   --   --   --  2.0  --   --  1.9  --   PHOS  --   --   --  4.1  --   --  3.2  --  5.2*  --   --   --  3.7  ALBUMIN 2.6*  --   --  2.9*  --   --  3.2*  --  3.2*  --   --   --  2.9*   < > = values in this interval not displayed.     Liver Function Tests: Recent Labs  Lab 09/22/22 0040  ALBUMIN 2.9*   No results for input(s): "LIPASE", "AMYLASE" in the last 168 hours. No results for input(s): "AMMONIA" in the last 168 hours. CBC: Recent Labs    11/29/21 1925 05/23/22 0759 06/05/22 1852 06/05/22 2355 06/06/22 0012 06/07/22 0508 09/20/22 1334 09/21/22 0347 09/22/22 0040  HGB  --    < > 10.8* 10.7*  --  10.5* 7.9* 8.7*  --   MCV  --    < > 76.2* 76.8*  --  74.3* 73.4* 73.3*  --   FERRITIN  427*  --   --   --  604*  --   --   --  558*  TIBC 293  --   --   --  242*  --   --   --  290  IRON 51  --   --   --  21*  --   --   --  80   < > = values in this interval not displayed.    Cardiac Enzymes: No results for input(s): "CKTOTAL", "CKMB", "CKMBINDEX", "TROPONINI" in the last 168 hours. CBG: No results for input(s): "GLUCAP" in the last 168 hours.  Iron Studies:  Recent Labs    09/22/22 0040  IRON 80  TIBC 290  FERRITIN 558*   Studies/Results: ECHOCARDIOGRAM COMPLETE  Result Date: 09/21/2022    ECHOCARDIOGRAM REPORT   Patient Name:   [name] Date of Exam: 09/21/2022 Medical Rec #:  [id number]     Height:       73.0 in Accession #:    [id number]    Weight:       200.0 lb Date of Birth:  1961/05/30     BSA:          2.152 m Patient Age:    70 years      BP:           151/90 mmHg Patient Gender: M             HR:           78 bpm. Exam Location:  Inpatient Procedure: 2D Echo Indications:    CHF  History:        Patient has prior history of Echocardiogram examinations, most                 recent 11/29/2021. COPD; Risk Factors:Hypertension.  Sonographer:    Harvie Junior Referring Phys: [place]  1. Left  ventricular ejection fraction, by estimation, is 55 to 60%. The left ventricle has normal function. The left ventricle has no regional wall motion abnormalities. Left ventricular diastolic parameters are consistent with Grade II diastolic dysfunction (pseudonormalization).  2. Right ventricular systolic function is normal. The right ventricular size is mildly enlarged. There is normal pulmonary artery systolic pressure. The estimated right ventricular systolic pressure is 53.6 mmHg.  3. Left atrial size was mild to moderately dilated.  4. A small pericardial effusion is present. The pericardial effusion is circumferential.  5. The mitral valve is grossly normal. Mild mitral valve regurgitation. No evidence of mitral stenosis.  6. The aortic valve is tricuspid.  Aortic valve regurgitation is not visualized. No aortic stenosis is present.  7. The inferior vena cava is normal in size with greater than 50% respiratory variability, suggesting right atrial pressure of 3 mmHg. Comparison(s): No significant change from prior study. FINDINGS  Left Ventricle: Left ventricular ejection fraction, by estimation, is 55 to 60%. The left ventricle has normal function. The left ventricle has no regional wall motion abnormalities. The left ventricular internal cavity size was normal in size. There is  no left ventricular hypertrophy. Left ventricular diastolic parameters are consistent with Grade II diastolic dysfunction (pseudonormalization). Right Ventricle: The right ventricular size is mildly enlarged. No increase in right ventricular wall thickness. Right ventricular systolic function is normal. There is normal pulmonary artery systolic pressure. The tricuspid regurgitant velocity is 2.65  m/s, and with an assumed right atrial pressure of 3 mmHg, the estimated right ventricular systolic pressure is 14.4 mmHg. Left Atrium: Left atrial size was mild to moderately dilated. Right Atrium: Right atrial size was normal in size. Pericardium: A small pericardial effusion is present. The pericardial effusion is circumferential. Mitral Valve: The mitral valve is grossly normal. Mild mitral valve regurgitation. No evidence of mitral valve stenosis. Tricuspid Valve: The tricuspid valve is grossly normal. Tricuspid valve regurgitation is trivial. No evidence of tricuspid stenosis. Aortic Valve: The aortic valve is tricuspid. Aortic valve regurgitation is not visualized. No aortic stenosis is present. Aortic valve mean gradient measures 7.0 mmHg. Aortic valve peak gradient measures 10.5 mmHg. Aortic valve area, by VTI measures 2.86  cm. Pulmonic Valve: The pulmonic valve was grossly normal. Pulmonic valve regurgitation is not visualized. No evidence of pulmonic stenosis. Aorta: The aortic root is  normal in size and structure. Venous: The right lower pulmonary vein is normal. The inferior vena cava is normal in size with greater than 50% respiratory variability, suggesting right atrial pressure of 3 mmHg. IAS/Shunts: There is right bowing of the interatrial septum, suggestive of elevated left atrial pressure. The atrial septum is grossly normal.  LEFT VENTRICLE PLAX 2D LVIDd:         5.90 cm      Diastology LVIDs:         3.80 cm      LV e' medial:    8.16 cm/s LV PW:         1.10 cm      LV E/e' medial:  15.1 LV IVS:        1.00 cm      LV e' lateral:   7.29 cm/s LVOT diam:     2.10 cm      LV E/e' lateral: 16.9 LV SV:         89 LV SV Index:   41 LVOT Area:     3.46 cm  LV Volumes (MOD) LV vol d, MOD A2C:  169.0 ml LV vol d, MOD A4C: 158.0 ml LV vol s, MOD A2C: 68.9 ml LV vol s, MOD A4C: 77.0 ml LV SV MOD A2C:     100.1 ml LV SV MOD A4C:     158.0 ml LV SV MOD BP:      95.8 ml RIGHT VENTRICLE RV Basal diam:  4.50 cm RV Mid diam:    3.20 cm RV S prime:     19.70 cm/s TAPSE (M-mode): 2.9 cm LEFT ATRIUM            Index        RIGHT ATRIUM           Index LA diam:      4.70 cm  2.18 cm/m   RA Area:     19.30 cm LA Vol (A2C): 111.0 ml 51.59 ml/m  RA Volume:   54.30 ml  25.24 ml/m LA Vol (A4C): 69.2 ml  32.16 ml/m  AORTIC VALVE                     PULMONIC VALVE AV Area (Vmax):    2.67 cm      PV Vmax:          1.13 m/s AV Area (Vmean):   2.50 cm      PV Peak grad:     5.1 mmHg AV Area (VTI):     2.86 cm      PR End Diast Vel: 8.29 msec AV Vmax:           162.00 cm/s AV Vmean:          125.000 cm/s AV VTI:            0.311 m AV Peak Grad:      10.5 mmHg AV Mean Grad:      7.0 mmHg LVOT Vmax:         125.00 cm/s LVOT Vmean:        90.400 cm/s LVOT VTI:          0.257 m LVOT/AV VTI ratio: 0.83  AORTA Ao Root diam: 3.20 cm MITRAL VALVE                TRICUSPID VALVE MV Area (PHT): 5.16 cm     TR Peak grad:   28.1 mmHg MV Decel Time: 147 msec     TR Vmax:        265.00 cm/s MR Peak grad: 81.9 mmHg MR Vmax:       452.50 cm/s   SHUNTS MV E velocity: 123.00 cm/s  Systemic VTI:  0.26 m MV A velocity: 60.80 cm/s   Systemic Diam: 2.10 cm MV E/A ratio:  2.02 Eleonore Chiquito MD Electronically signed by Eleonore Chiquito MD Signature Date/Time: 09/21/2022/11:11:33 AM    Final    DG Chest 2 View  Result Date: 09/20/2022 CLINICAL DATA:  Shortness of breath EXAM: CHEST - 2 VIEW COMPARISON:  05/23/2022, 11/28/2021 FINDINGS: Cardiac silhouette is enlarged, increased in diameter from the most recent comparison study of 05/23/2022, although similar in size to 11/28/2021. Pulmonary vascular congestion. Increased interstitial markings bilaterally with more prominent interstitial opacities in the right lower lobe and right upper lobe. No pleural effusion or pneumothorax. IMPRESSION: 1. Findings suggest CHF with interstitial edema. 2. More prominent interstitial opacities in the right lower lobe and right upper lobe, likely asymmetric edema. Superimposed infection not excluded. 3. Cardiac silhouette has increased in size compared to the most recent chest x-ray. Underlying  pericardial effusion is not excluded. Electronically Signed   By: Davina Poke D.O.   On: 09/20/2022 14:08    Medications: Infusions:  sodium chloride      Scheduled Medications:  allopurinol  100 mg Oral Daily   amLODipine  10 mg Oral Daily   atorvastatin  40 mg Oral QHS   calcitRIOL  0.5 mcg Oral Daily   furosemide  80 mg Intravenous Q8H   heparin  5,000 Units Subcutaneous Q8H   isosorbide mononitrate  30 mg Oral Daily   labetalol  300 mg Oral BID   levothyroxine  100 mcg Oral Daily   mometasone-formoterol  2 puff Inhalation BID   sodium bicarbonate  650 mg Oral BID   sodium chloride flush  3 mL Intravenous Q12H    have reviewed scheduled and prn medications.  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

## 2022-09-23 DIAGNOSIS — G4733 Obstructive sleep apnea (adult) (pediatric): Secondary | ICD-10-CM | POA: Diagnosis not present

## 2022-09-23 DIAGNOSIS — E039 Hypothyroidism, unspecified: Secondary | ICD-10-CM | POA: Diagnosis not present

## 2022-09-23 DIAGNOSIS — E8779 Other fluid overload: Secondary | ICD-10-CM | POA: Diagnosis not present

## 2022-09-23 DIAGNOSIS — R0602 Shortness of breath: Secondary | ICD-10-CM | POA: Diagnosis not present

## 2022-09-23 DIAGNOSIS — N185 Chronic kidney disease, stage 5: Secondary | ICD-10-CM | POA: Diagnosis not present

## 2022-09-23 DIAGNOSIS — I10 Essential (primary) hypertension: Secondary | ICD-10-CM | POA: Diagnosis not present

## 2022-09-23 DIAGNOSIS — I5033 Acute on chronic diastolic (congestive) heart failure: Secondary | ICD-10-CM | POA: Diagnosis not present

## 2022-09-23 DIAGNOSIS — E7800 Pure hypercholesterolemia, unspecified: Secondary | ICD-10-CM | POA: Diagnosis not present

## 2022-09-23 DIAGNOSIS — M1A00X0 Idiopathic chronic gout, unspecified site, without tophus (tophi): Secondary | ICD-10-CM | POA: Diagnosis not present

## 2022-09-23 DIAGNOSIS — I132 Hypertensive heart and chronic kidney disease with heart failure and with stage 5 chronic kidney disease, or end stage renal disease: Secondary | ICD-10-CM | POA: Diagnosis not present

## 2022-09-23 DIAGNOSIS — D649 Anemia, unspecified: Secondary | ICD-10-CM | POA: Diagnosis not present

## 2022-09-23 DIAGNOSIS — J441 Chronic obstructive pulmonary disease with (acute) exacerbation: Secondary | ICD-10-CM | POA: Diagnosis not present

## 2022-09-23 LAB — RENAL FUNCTION PANEL
Albumin: 2.7 g/dL — ABNORMAL LOW (ref 3.5–5.0)
Anion gap: 16 — ABNORMAL HIGH (ref 5–15)
BUN: 126 mg/dL — ABNORMAL HIGH (ref 6–20)
CO2: 19 mmol/L — ABNORMAL LOW (ref 22–32)
Calcium: 8.1 mg/dL — ABNORMAL LOW (ref 8.9–10.3)
Chloride: 103 mmol/L (ref 98–111)
Creatinine, Ser: 9 mg/dL — ABNORMAL HIGH (ref 0.61–1.24)
GFR, Estimated: 6 mL/min — ABNORMAL LOW (ref >60)
Glucose, Bld: 114 mg/dL — ABNORMAL HIGH (ref 70–99)
Phosphorus: 3.8 mg/dL (ref 2.5–4.6)
Potassium: 4 mmol/L (ref 3.5–5.1)
Sodium: 138 mmol/L (ref 135–145)

## 2022-09-23 MED ORDER — ALBUMIN HUMAN 25 % IV SOLN
12.5000 g | Freq: Four times a day (QID) | INTRAVENOUS | Status: AC
  Administered 2022-09-23 (×2): 12.5 g via INTRAVENOUS
  Filled 2022-09-23 (×2): qty 50

## 2022-09-23 NOTE — Progress Notes (Addendum)
[name]  Assessment/ Plan: Pt is a 61 y.o. yo male  with history of hypertension, OSA, hep C, COPD, CHF, CKD stage V with baseline serum creatinine level around 7-8, presented with worsening shortness of breath for about a week, seen as a consultation for the evaluation of CKD 5 with fluid overload. He  follows with Dr. Joylene Grapes at [facility].    # CKD stage V with fluid overload, nonoliguric: He presented with shortness of breath and acute pulmonary edema.  The baseline serum creatinine level has been around 7-8 and has left upper extremity fistula since 2013.  He has increased urine output and breathing is much better with IV diuretics however both BUN and creatinine level worsening. He still has some shortness of breath and peripheral edema.  I have discussed about starting dialysis today however, patient wanted to watch 1-2 with diuretics and see the response.  He understands that dialysis is very imminent.  He has no signs or symptoms of uremia therefore no urgent indication for HD today. He has AV fistula matured if needed.  I will order albumin with Lasix today. Strict ins and outs and daily lab monitoring.   #Acute on chronic diastolic CHF: Echo with EF 55 to 38%, grade 2 diastolic dysfunction.  Continue IV diuretics as above.     #Metabolic acidosis: Increased sodium bicarbonate dose.   #Hypertension/volume: Continue current antihypertensives include amlodipine, Imdur and diuretics per the volume management.   #Anemia of CKD: Iron saturation 28 and ferritin 558.  Received Aranesp on 11/18.  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  Monitor calcium and phosphorus level.  Discussed with the primary team.  Subjective: Seen and examined at bedside.  The urine output is 4.4 L.  He reports he is breathing is improving and denies nausea, vomiting, dysgeusia, chest pain.  He has not walked in the hallway yet.  Objective Vital signs in last 24 hours: Vitals:   09/23/22  0100 09/23/22 0530 09/23/22 0812 09/23/22 0918  BP:  (!) 144/83  (!) 139/91  Pulse:  85 86 80  Resp:  20 16   Temp:  98 F (36.7 C)    TempSrc:  Oral    SpO2:  100%    Weight: 95.3 kg     Height:       Weight change: -2.245 kg  Intake/Output Summary (Last 24 hours) at 09/23/2022 0947 Last data filed at 09/23/2022 0945 Gross per 24 hour  Intake 920 ml  Output 4400 ml  Net -3480 ml        Labs: RENAL PANEL Recent Labs    11/29/21 0252 11/29/21 0536 11/30/21 0447 05/23/22 0759 05/23/22 1045 05/24/22 0341 06/05/22 1852 06/05/22 2355 06/07/22 0508 09/20/22 1334 09/21/22 0347 09/22/22 0040 09/23/22 0037  NA  --    < > 135 138 137 134* 139 142 132* 139 138 136 138  K  --    < > 4.4 3.8 3.9 4.3 4.0 4.2 4.3 4.2 4.6 4.1 4.0  CL  --    < > 105 104  --  102 110 112* 102 109 108 104 103  CO2  --    < > 20* 18*  --  17* 19* 15* 18* 20* 19* 18* 19*  GLUCOSE  --    < > 113* 101*  --  128* 114* 105* 150* 113* 141* 138* 114*  BUN  --    < > 60* 84*  --  91* 97* 96* 96* 93* 95*  113* 126*  CREATININE  --    < > 6.02* 7.93*  --  8.29* 7.92* 7.43* 7.34* 8.28* 8.20* 8.69* 9.00*  CALCIUM  --    < > 7.9* 8.9  --  8.8* 9.0 9.1 8.7* 7.4* 8.0* 8.1* 8.1*  MG 1.9  --   --   --   --   --   --  2.0  --   --  1.9  --   --   PHOS  --   --  4.1  --   --  3.2  --  5.2*  --   --   --  3.7 3.8  ALBUMIN 2.6*  --  2.9*  --   --  3.2*  --  3.2*  --   --   --  2.9* 2.7*   < > = values in this interval not displayed.      Liver Function Tests: Recent Labs  Lab 09/22/22 0040 09/23/22 0037  ALBUMIN 2.9* 2.7*    No results for input(s): "LIPASE", "AMYLASE" in the last 168 hours. No results for input(s): "AMMONIA" in the last 168 hours. CBC: Recent Labs    11/29/21 1925 05/23/22 0759 06/05/22 1852 06/05/22 2355 06/06/22 0012 06/07/22 0508 09/20/22 1334 09/21/22 0347 09/22/22 0040  HGB  --    < > 10.8* 10.7*  --  10.5* 7.9* 8.7*  --   MCV  --    < > 76.2* 76.8*  --  74.3* 73.4* 73.3*   --   FERRITIN 427*  --   --   --  604*  --   --   --  558*  TIBC 293  --   --   --  242*  --   --   --  290  IRON 51  --   --   --  21*  --   --   --  80   < > = values in this interval not displayed.     Cardiac Enzymes: No results for input(s): "CKTOTAL", "CKMB", "CKMBINDEX", "TROPONINI" in the last 168 hours. CBG: No results for input(s): "GLUCAP" in the last 168 hours.  Iron Studies:  Recent Labs    09/22/22 0040  IRON 80  TIBC 290  FERRITIN 558*    Studies/Results: No results found.  Medications: Infusions:  sodium chloride      Scheduled Medications:  allopurinol  100 mg Oral Daily   amLODipine  10 mg Oral Daily   atorvastatin  40 mg Oral QHS   calcitRIOL  0.5 mcg Oral Daily   furosemide  80 mg Intravenous BID   heparin  5,000 Units Subcutaneous Q8H   isosorbide mononitrate  30 mg Oral Daily   labetalol  300 mg Oral BID   levothyroxine  100 mcg Oral Daily   mometasone-formoterol  2 puff Inhalation BID   sodium bicarbonate  1,300 mg Oral BID   sodium chloride flush  3 mL Intravenous Q12H    have reviewed scheduled and prn medications.  Physical Exam: General: Pleasant male, sitting on bed, not in distress Heart:RRR, s1s2 nl Lungs: Basal rhonchi Abdomen:soft, Non-tender, non-distended Extremities: Bilateral leg pitting edema+, improving. 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

## 2022-09-23 NOTE — Progress Notes (Signed)
Progress Note   Patient: [name] SNK:[id number] DOB: 1961-10-16 DOA: 09/20/2022     3 DOS: the patient was seen and examined on 09/23/2022   Brief hospital course: Mr. Johnny [name] was admitted to the hospital with the working diagnosis of volume overload.   61 yo male with the past medical history of hypertension, hepatitis C, CKD stage V (AV fistula in place), COPD and heart failure who presented with dyspnea. Patient reported 2 weeks of worsening dyspnea, to the point where he is symptomatic with minimal efforts, positive orthopnea and lower extremity edema. Positive weight gain over last 6 months. On his initial physical examination is blood pressure was 143/87, HR 78, RR 16 and 02 saturation 100%, positive JVD, lungs with no wheezing, or rales, heart with S1 and S2 present and rhythmic, abdomen with no distention, positive lower extremity edema +++ pitting.   NA 138, K 4,2 CL 109 bicarbonate 20, glucose 113 bun 93 cr 8,28  BNP 1,655  Wbc 10.7 hgb 7.9 plt 187   Chest radiograph with mild cardiomegaly, symmetric bilateral interstitial infiltrates with fluid in the right fissure.   EKG 77 bpm, normal axis and normal intervals, sinus rhythm with no significant ST segment, negative T waves V4 to V6.   Patient was placed on high doses of furosemide for diuresis.   11/19 volume has been improving, but continue with significant volume overload. His BUN continue to rise.  Patient may need to start on renal replacement therapy on this hospitalization.   Assessment and Plan: * CKD (chronic kidney disease) stage 5, GFR less than 15 ml/min (HCC) Patient continue with volume overload, no signs of uremia.  His base serum cr is around high 7 and now is low 8  Today his urine output is 9,379 ml Systolic blood pressure is 144 to 139 mmHg   Today renal function with serum cr at 9,0, K is 4,0 and serum bicarbonate at 19, P is 3,8   Continue diuresis with furosemide to 80 mg IV q 12hrs Added 2  doses of albumin IV 11/17 metolazone Functioning left upper extremity fistula, created in 2013  Patient may need to start renal replacement therapy on this hospitalization .    Metabolic bone disease, continue with calcitriol.  Anemia of chronic renal disease continue with EPO.  For metabolic acidosis continue with oral sodium bicarbonate   Acute on chronic diastolic CHF (congestive heart failure) ([place]) Patient continue with volume overload Acute pulmonary edema due to progressive renal failure.  Echocardiogram with preserved LV systolic function with EF 55 to 02%, RV systolic function preserved, small pericardial effusion, no significant valvular disease.   Continue diuresis with IV furosemide.  11/17 one dose of metolazone   Patient may need renal replacement therapy during this hospitalization   Essential hypertension Continue close blood pressure monitoring Continue with isosorbide, amlodipine and labetalol Diuresis with furosemide.    Hyperlipidemia Continue with statin therapy   Hypothyroidism Levothyroxine   Gout No acute flare   COPD (chronic obstructive pulmonary disease) (HCC) No clinical signs of exacerbation         Subjective: Patient with improvement in edema and dyspnea but not back to his baseline, no nausea or vomiting   Physical Exam: Vitals:   09/23/22 0100 09/23/22 0530 09/23/22 0812 09/23/22 0918  BP:  (!) 144/83  (!) 139/91  Pulse:  85 86 80  Resp:  20 16   Temp:  98 F (36.7 C)    TempSrc:  Oral  SpO2:  100%    Weight: 95.3 kg     Height:       Neurology awake and alert ENT with mild pallor Cardiovascular with S1 and S2 present with no gallops or rubs Respiratory with bilateral rales with no wheezing Abdomen with no distention Positive lower extremity edema ++  Data Reviewed:    Family Communication: no family at the bedside   Disposition: Status is: Inpatient Remains inpatient appropriate because: IV diuresis, worsening  renal function   Planned Discharge Destination: Home     Author: Tawni Millers, MD 09/23/2022 1:16 PM  For on call review [email or web address].

## 2022-09-23 NOTE — Plan of Care (Signed)
  Problem: Clinical Measurements: Goal: Will remain free from infection Outcome: Completed/Met   Problem: Skin Integrity: Goal: Risk for impaired skin integrity will decrease Outcome: Completed/Met

## 2022-09-23 NOTE — Plan of Care (Signed)
  Problem: Clinical Measurements: Goal: Respiratory complications will improve Outcome: Completed/Met   Problem: Nutrition: Goal: Adequate nutrition will be maintained Outcome: Completed/Met   Problem: Coping: Goal: Level of anxiety will decrease Outcome: Completed/Met   Problem: Elimination: Goal: Will not experience complications related to urinary retention Outcome: Completed/Met   Problem: Pain Managment: Goal: General experience of comfort will improve Outcome: Completed/Met   Problem: Safety: Goal: Ability to remain free from injury will improve Outcome: Completed/Met

## 2022-09-24 DIAGNOSIS — N185 Chronic kidney disease, stage 5: Secondary | ICD-10-CM | POA: Diagnosis not present

## 2022-09-24 DIAGNOSIS — D649 Anemia, unspecified: Secondary | ICD-10-CM | POA: Diagnosis not present

## 2022-09-24 DIAGNOSIS — E8779 Other fluid overload: Secondary | ICD-10-CM | POA: Diagnosis not present

## 2022-09-24 DIAGNOSIS — M1A00X0 Idiopathic chronic gout, unspecified site, without tophus (tophi): Secondary | ICD-10-CM | POA: Diagnosis not present

## 2022-09-24 DIAGNOSIS — I10 Essential (primary) hypertension: Secondary | ICD-10-CM | POA: Diagnosis not present

## 2022-09-24 DIAGNOSIS — E7800 Pure hypercholesterolemia, unspecified: Secondary | ICD-10-CM | POA: Diagnosis not present

## 2022-09-24 DIAGNOSIS — I132 Hypertensive heart and chronic kidney disease with heart failure and with stage 5 chronic kidney disease, or end stage renal disease: Secondary | ICD-10-CM | POA: Diagnosis not present

## 2022-09-24 DIAGNOSIS — R0602 Shortness of breath: Secondary | ICD-10-CM | POA: Diagnosis not present

## 2022-09-24 DIAGNOSIS — J441 Chronic obstructive pulmonary disease with (acute) exacerbation: Secondary | ICD-10-CM | POA: Diagnosis not present

## 2022-09-24 DIAGNOSIS — G4733 Obstructive sleep apnea (adult) (pediatric): Secondary | ICD-10-CM | POA: Diagnosis not present

## 2022-09-24 DIAGNOSIS — I5033 Acute on chronic diastolic (congestive) heart failure: Secondary | ICD-10-CM | POA: Diagnosis not present

## 2022-09-24 DIAGNOSIS — E039 Hypothyroidism, unspecified: Secondary | ICD-10-CM | POA: Diagnosis not present

## 2022-09-24 LAB — RENAL FUNCTION PANEL
Albumin: 3 g/dL — ABNORMAL LOW (ref 3.5–5.0)
Anion gap: 15 (ref 5–15)
BUN: 128 mg/dL — ABNORMAL HIGH (ref 6–20)
CO2: 22 mmol/L (ref 22–32)
Calcium: 8.5 mg/dL — ABNORMAL LOW (ref 8.9–10.3)
Chloride: 99 mmol/L (ref 98–111)
Creatinine, Ser: 9.35 mg/dL — ABNORMAL HIGH (ref 0.61–1.24)
GFR, Estimated: 6 mL/min — ABNORMAL LOW (ref >60)
Glucose, Bld: 102 mg/dL — ABNORMAL HIGH (ref 70–99)
Phosphorus: 3.8 mg/dL (ref 2.5–4.6)
Potassium: 4 mmol/L (ref 3.5–5.1)
Sodium: 136 mmol/L (ref 135–145)

## 2022-09-24 MED ORDER — TORSEMIDE 100 MG PO TABS
100.0000 mg | ORAL_TABLET | Freq: Every day | ORAL | Status: DC
  Administered 2022-09-24 – 2022-09-25 (×2): 100 mg via ORAL
  Filled 2022-09-24 (×2): qty 1

## 2022-09-24 NOTE — Progress Notes (Signed)
[facility] NEPHROLOGY PROGRESS NOTE  Assessment/ Plan: Pt is a 61 y.o. yo male  with history of hypertension, OSA, hep C, COPD, CHF, CKD stage V with baseline serum creatinine level around 7-8, presented with worsening shortness of breath for about a week, seen as a consultation for the evaluation of CKD 5 with fluid overload. He  follows with Dr. Joylene Grapes at [facility].    # CKD stage V with fluid overload, nonoliguric: Presented with volume overload.  Baseline creatinine 7-8.  Has a left upper extremity AV fistula -Creatinine slightly higher and urine output continues to be good with Lasix -Patient continues to be hesitant about starting dialysis so demonstrates no uremic symptoms -Will decrease diuretic regimen to torsemide 100 mg daily -If creatinine is fairly stable tomorrow I think he could likely discharge -I am that he follow-up with [place] closely in the outpatient setting   #Acute on chronic diastolic CHF: Echo with EF 55 to 73%, grade 2 diastolic dysfunction.  Continue IV diuretics as above.     #Metabolic acidosis: continue oral sodium bicarbonate   #Hypertension/volume: Continue current antihypertensives include amlodipine, Imdur and diuretics per the volume management.   #Anemia of CKD: Iron saturation 28 and ferritin 558.  Received Aranesp on 11/18.  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  Monitor calcium and phosphorus level.  Subjective: Patient states he feels great today.  Urine output continues to be good.  No significant shortness of breath, nausea, vomiting, decreased appetite  Objective Vital signs in last 24 hours: Vitals:   09/23/22 0812 09/23/22 0918 09/23/22 1932 09/24/22 0448  BP:  (!) 139/91 (!) 146/84 (!) 145/89  Pulse: 86 80 80 80  Resp: '16  18 18  '$ Temp:   (!) 97.5 F (36.4 C) 98 F (36.7 C)  TempSrc:   Oral Oral  SpO2:   100% 100%  Weight:    91.9 kg  Height:       Weight change: -3.311 kg  Intake/Output Summary (Last 24 hours) at  09/24/2022 0852 Last data filed at 09/24/2022 2202 Gross per 24 hour  Intake 989.04 ml  Output 4050 ml  Net -3060.96 ml       Labs: RENAL PANEL Recent Labs    11/29/21 0252 11/29/21 0536 11/30/21 0447 05/23/22 0759 05/23/22 1045 05/24/22 0341 06/05/22 1852 06/05/22 2355 06/07/22 0508 09/20/22 1334 09/21/22 0347 09/22/22 0040 09/23/22 0037 09/24/22 0040  NA  --    < > 135 138 137 134* 139 142 132* 139 138 136 138 136  K  --    < > 4.4 3.8 3.9 4.3 4.0 4.2 4.3 4.2 4.6 4.1 4.0 4.0  CL  --    < > 105 104  --  102 110 112* 102 109 108 104 103 99  CO2  --    < > 20* 18*  --  17* 19* 15* 18* 20* 19* 18* 19* 22  GLUCOSE  --    < > 113* 101*  --  128* 114* 105* 150* 113* 141* 138* 114* 102*  BUN  --    < > 60* 84*  --  91* 97* 96* 96* 93* 95* 113* 126* 128*  CREATININE  --    < > 6.02* 7.93*  --  8.29* 7.92* 7.43* 7.34* 8.28* 8.20* 8.69* 9.00* 9.35*  CALCIUM  --    < > 7.9* 8.9  --  8.8* 9.0 9.1 8.7* 7.4* 8.0* 8.1* 8.1* 8.5*  MG 1.9  --   --   --   --   --   --  2.0  --   --  1.9  --   --   --   PHOS  --   --  4.1  --   --  3.2  --  5.2*  --   --   --  3.7 3.8 3.8  ALBUMIN 2.6*  --  2.9*  --   --  3.2*  --  3.2*  --   --   --  2.9* 2.7* 3.0*   < > = values in this interval not displayed.     Liver Function Tests: Recent Labs  Lab 09/22/22 0040 09/23/22 0037 09/24/22 0040  ALBUMIN 2.9* 2.7* 3.0*   No results for input(s): "LIPASE", "AMYLASE" in the last 168 hours. No results for input(s): "AMMONIA" in the last 168 hours. CBC: Recent Labs    11/29/21 1925 05/23/22 0759 06/05/22 1852 06/05/22 2355 06/06/22 0012 06/07/22 0508 09/20/22 1334 09/21/22 0347 09/22/22 0040  HGB  --    < > 10.8* 10.7*  --  10.5* 7.9* 8.7*  --   MCV  --    < > 76.2* 76.8*  --  74.3* 73.4* 73.3*  --   FERRITIN 427*  --   --   --  604*  --   --   --  558*  TIBC 293  --   --   --  242*  --   --   --  290  IRON 51  --   --   --  21*  --   --   --  80   < > = values in this interval not  displayed.    Cardiac Enzymes: No results for input(s): "CKTOTAL", "CKMB", "CKMBINDEX", "TROPONINI" in the last 168 hours. CBG: No results for input(s): "GLUCAP" in the last 168 hours.  Iron Studies:  Recent Labs    09/22/22 0040  IRON 80  TIBC 290  FERRITIN 558*   Studies/Results: No results found.  Medications: Infusions:  sodium chloride      Scheduled Medications:  allopurinol  100 mg Oral Daily   amLODipine  10 mg Oral Daily   atorvastatin  40 mg Oral QHS   calcitRIOL  0.5 mcg Oral Daily   heparin  5,000 Units Subcutaneous Q8H   isosorbide mononitrate  30 mg Oral Daily   labetalol  300 mg Oral BID   levothyroxine  100 mcg Oral Daily   mometasone-formoterol  2 puff Inhalation BID   sodium bicarbonate  1,300 mg Oral BID   sodium chloride flush  3 mL Intravenous Q12H   torsemide  100 mg Oral Daily    have reviewed scheduled and prn medications.  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

## 2022-09-24 NOTE — Progress Notes (Signed)
Pt set up on CPAP with nasal mask.  Pt tolerating well at this time.

## 2022-09-24 NOTE — Progress Notes (Signed)
Progress Note   Patient: Johnny [name] PRF:[id number] DOB: 18-Jul-1961 DOA: 09/20/2022     4 DOS: the patient was seen and examined on 09/24/2022   Brief hospital course: Johnny [name] was admitted to the hospital with the working diagnosis of volume overload.   61 yo male with the past medical history of hypertension, hepatitis C, CKD stage V (AV fistula in place), COPD and heart failure who presented with dyspnea. Patient reported 2 weeks of worsening dyspnea, to the point where he is symptomatic with minimal efforts, positive orthopnea and lower extremity edema. Positive weight gain over last 6 months. On his initial physical examination is blood pressure was 143/87, HR 78, RR 16 and 02 saturation 100%, positive JVD, lungs with no wheezing, or rales, heart with S1 and S2 present and rhythmic, abdomen with no distention, positive lower extremity edema +++ pitting.   NA 138, K 4,2 CL 109 bicarbonate 20, glucose 113 bun 93 cr 8,28  BNP 1,655  Wbc 10.7 hgb 7.9 plt 187   Chest radiograph with mild cardiomegaly, symmetric bilateral interstitial infiltrates with fluid in the right fissure.   EKG 77 bpm, normal axis and normal intervals, sinus rhythm with no significant ST segment, negative T waves V4 to V6.   Patient was placed on high doses of furosemide for diuresis.   11/19 volume has been improving, but continue with significant volume overload. His BUN continue to rise.  Patient may need to start on renal replacement therapy on this hospitalization.  11/20 volume status continue to improve with diuresis but serum cr not yet stable and continue to have high BUN.    Assessment and Plan: * CKD (chronic kidney disease) stage 5, GFR less than 15 ml/min (HCC) Patient continue with volume overload, no signs of uremia.  His base serum cr is around high 7 and now is low 8  Today his urine output is 9,357 ml Systolic blood pressure is 120 to 150 mmHg   Continue elevation of BUN up to 128, K is  4,0 and serum bicarbonate at 22.  Serum cr is 9,35 and P 3,8   Sp 2 doses of albumin IV 11/17 metolazone Transitioned to oral torsemide 100 mg daily.  Functioning left upper extremity fistula, created in 0177    Metabolic bone disease, continue with calcitriol.  Anemia of chronic renal disease continue with EPO.  For metabolic acidosis continue with oral sodium bicarbonate   If serum cr and BUN stable, possible dc home tomorrow with close follow up as outpatient, he would like to delay hemodialysis as much as possible.   Acute on chronic diastolic CHF (congestive heart failure) ([place]) Patient continue with volume overload Acute pulmonary edema due to progressive renal failure.  Echocardiogram with preserved LV systolic function with EF 55 to 93%, RV systolic function preserved, small pericardial effusion, no significant valvular disease.   11/17 one dose of metolazone  His volume continue to improve, now transitioned to oral torsemide.  Will need close follow up as outpatient.   Essential hypertension Continue close blood pressure monitoring Continue with isosorbide, amlodipine and labetalol Diuresis with torsemide   Hyperlipidemia Continue with statin therapy   Hypothyroidism Levothyroxine   Gout No acute flare   COPD (chronic obstructive pulmonary disease) (HCC) No clinical signs of exacerbation         Subjective: Patient continue to have improvement in edema, no dyspnea, no nausea or vomiting, no confusion   Physical Exam: Vitals:   09/24/22 0448 09/24/22 9030  09/24/22 1003 09/24/22 1120  BP: (!) 145/89 (!) 153/82  129/76  Pulse: 80 78  77  Resp: 18   18  Temp: 98 F (36.7 C)   97.9 F (36.6 C)  TempSrc: Oral   Oral  SpO2: 100%  98% 97%  Weight: 91.9 kg     Height:       Neurology awake and alert, no confusion or tremors ENT with mild pallor Cardiovascular with S1 and S2 present and rhythmic with no gallops, rubs or murmurs Respiratory with no  further wheezing, rales or rhonchi Abdomen with no distention Positive lower extremity edema +  Data Reviewed:    Family Communication: no family at the bedside   Disposition: Status is: Inpatient Remains inpatient appropriate because: pending renal function to stabilize   Planned Discharge Destination: Home    Author: Tawni Millers, MD 09/24/2022 12:52 PM  For on call review [email or web address].

## 2022-09-24 NOTE — Consult Note (Signed)
   [facility] Inpatient Consult   09/24/2022  VERNIS EID 1961-05-26 [id number]  Managed Medicaid: [place]   Primary Care Provider:  Charlott Rakes, MD with [facility] and Wellness   Patient in the past with [facility] for chronic disease management services.  Patient has been engaged by a [facility] RN .  Our community based plan of care has focused on disease management and community resource support.  Patient has had [place] for resources in the past for food insecurity.  Patient's current electronic medical record MD progress notes reviewed at this time for ongoing medical treatment and disposition needs ongoing.   Patient discussed in morning progression meeting. Place an appointment follow up card on bedside table.    Plan: Continue to follow for disposition needs and review for ongoing post hospital need.  Updated Inpatient Transition Of Care [TOC] team members to make aware that [place] [facility] following.   Of note, [facility] Care Management services does not replace or interfere with any services that are needed or arranged by inpatient [facility] care management team.   For additional questions or referrals please contact:  Natividad Brood, RN BSN [place]  [phone] business mobile phone Toll free office [phone]  *[place]  [phone] Fax number: [phone] [email or web address]

## 2022-09-25 ENCOUNTER — Other Ambulatory Visit (HOSPITAL_COMMUNITY): Payer: Self-pay

## 2022-09-25 DIAGNOSIS — N185 Chronic kidney disease, stage 5: Secondary | ICD-10-CM | POA: Diagnosis not present

## 2022-09-25 DIAGNOSIS — J441 Chronic obstructive pulmonary disease with (acute) exacerbation: Secondary | ICD-10-CM | POA: Diagnosis not present

## 2022-09-25 DIAGNOSIS — I132 Hypertensive heart and chronic kidney disease with heart failure and with stage 5 chronic kidney disease, or end stage renal disease: Secondary | ICD-10-CM | POA: Diagnosis not present

## 2022-09-25 DIAGNOSIS — M1A00X0 Idiopathic chronic gout, unspecified site, without tophus (tophi): Secondary | ICD-10-CM | POA: Diagnosis not present

## 2022-09-25 DIAGNOSIS — R0602 Shortness of breath: Secondary | ICD-10-CM | POA: Diagnosis not present

## 2022-09-25 DIAGNOSIS — I10 Essential (primary) hypertension: Secondary | ICD-10-CM | POA: Diagnosis not present

## 2022-09-25 DIAGNOSIS — E039 Hypothyroidism, unspecified: Secondary | ICD-10-CM | POA: Diagnosis not present

## 2022-09-25 DIAGNOSIS — E7800 Pure hypercholesterolemia, unspecified: Secondary | ICD-10-CM | POA: Diagnosis not present

## 2022-09-25 DIAGNOSIS — G4733 Obstructive sleep apnea (adult) (pediatric): Secondary | ICD-10-CM | POA: Diagnosis not present

## 2022-09-25 DIAGNOSIS — D649 Anemia, unspecified: Secondary | ICD-10-CM | POA: Diagnosis not present

## 2022-09-25 DIAGNOSIS — I5033 Acute on chronic diastolic (congestive) heart failure: Secondary | ICD-10-CM | POA: Diagnosis not present

## 2022-09-25 DIAGNOSIS — E8779 Other fluid overload: Secondary | ICD-10-CM | POA: Diagnosis not present

## 2022-09-25 LAB — RENAL FUNCTION PANEL
Albumin: 3 g/dL — ABNORMAL LOW (ref 3.5–5.0)
Anion gap: 17 — ABNORMAL HIGH (ref 5–15)
BUN: 126 mg/dL — ABNORMAL HIGH (ref 6–20)
CO2: 25 mmol/L (ref 22–32)
Calcium: 8.5 mg/dL — ABNORMAL LOW (ref 8.9–10.3)
Chloride: 95 mmol/L — ABNORMAL LOW (ref 98–111)
Creatinine, Ser: 9.5 mg/dL — ABNORMAL HIGH (ref 0.61–1.24)
GFR, Estimated: 6 mL/min — ABNORMAL LOW (ref >60)
Glucose, Bld: 101 mg/dL — ABNORMAL HIGH (ref 70–99)
Phosphorus: 4.6 mg/dL (ref 2.5–4.6)
Potassium: 3.6 mmol/L (ref 3.5–5.1)
Sodium: 137 mmol/L (ref 135–145)

## 2022-09-25 MED ORDER — ALLOPURINOL 100 MG PO TABS
100.0000 mg | ORAL_TABLET | Freq: Every day | ORAL | 0 refills | Status: DC
  Filled 2022-09-25: qty 30, 30d supply, fill #0

## 2022-09-25 MED ORDER — OXYCODONE HCL 5 MG PO TABS
5.0000 mg | ORAL_TABLET | Freq: Once | ORAL | Status: DC
  Filled 2022-09-25: qty 1

## 2022-09-25 MED ORDER — PREDNISONE 20 MG PO TABS
20.0000 mg | ORAL_TABLET | Freq: Every day | ORAL | 0 refills | Status: AC
  Filled 2022-09-25: qty 3, 3d supply, fill #0

## 2022-09-25 MED ORDER — TORSEMIDE 100 MG PO TABS
100.0000 mg | ORAL_TABLET | Freq: Every day | ORAL | 0 refills | Status: DC
  Filled 2022-09-25: qty 30, 30d supply, fill #0

## 2022-09-25 MED ORDER — PREDNISONE 20 MG PO TABS
20.0000 mg | ORAL_TABLET | Freq: Every day | ORAL | Status: DC
  Administered 2022-09-25: 20 mg via ORAL
  Filled 2022-09-25: qty 1

## 2022-09-25 NOTE — TOC Progression Note (Signed)
Transition of Care [facility]) - Progression Note    Patient Details  Name: Johnny [name] MRN: [id number] Date of Birth: September 19, 1961  [facility]) CM/SW Contact  Zenon Mayo, RN Phone Number: 09/25/2022, 10:10 AM  Clinical Narrative:    from home, CKD SOB conts on lasix IV, cret still goiing up 9.5, has a fistula that is 59 years old in case needs dialysis, TOC following.        Expected Discharge Plan and Services                                                 Social Determinants of Health (SDOH) Interventions    Readmission Risk Interventions     No data to display

## 2022-09-25 NOTE — TOC Initial Note (Signed)
Transition of Care [facility]) - Initial/Assessment Note    Patient Details  Name: Johnny [name] MRN: [id number] Date of Birth: April 08, 1961  Transition of Care [facility]) CM/SW Contact:    Zenon Mayo, RN Phone Number: 09/25/2022, 1:48 PM  Clinical Narrative:                 Patient is for dc today, friend is at the bedside, she states they will be taking a taxi home , patient states he will be paying for the taxie.   Expected Discharge Plan: Home/Self Care Barriers to Discharge: No Barriers Identified   Patient Goals and CMS Choice Patient states their goals for this hospitalization and ongoing recovery are:: return home   Choice offered to / list presented to : NA  Expected Discharge Plan and Services Expected Discharge Plan: Home/Self Care In-house Referral: NA Discharge Planning Services: CM Consult Post Acute Care Choice: NA Living arrangements for the past 2 months: Single Family Home Expected Discharge Date: 09/25/22               DME Arranged: N/A DME Agency: NA                  Prior Living Arrangements/Services Living arrangements for the past 2 months: Single Family Home Lives with:: Self   Do you feel safe going back to the place where you live?: Yes      Need for Family Participation in Patient Care: Yes (Comment) Care giver support system in place?: Yes (comment)   Criminal Activity/Legal Involvement Pertinent to Current Situation/Hospitalization: No - Comment as needed  Activities of Daily Living      Permission Sought/Granted                  Emotional Assessment Appearance:: Appears stated age Attitude/Demeanor/Rapport: Engaged Affect (typically observed): Appropriate Orientation: : Oriented to Self, Oriented to Place, Oriented to  Time, Oriented to Situation Alcohol / Substance Use: Not Applicable Psych Involvement: No (comment)  Admission diagnosis:  COPD exacerbation ([place]) [J44.1] Fluid overload [E87.70] Acute on chronic  congestive heart failure, unspecified heart failure type ([place]) [I50.9] Patient Active Problem List   Diagnosis Date Noted   Fluid overload 09/20/2022   Group A streptococcal infection 06/05/2022   COPD exacerbation ([place]) 05/23/2022   AKI (acute kidney injury) ([place]) 11/29/2021   Toenail fungus 06/11/2019   Homelessness 06/11/2019   Other atopic dermatitis 06/11/2019   Hypothyroidism 06/11/2019   Eczema of hand 11/10/2018   A-V fistula ([place]) 11/10/2018   Acute on chronic diastolic CHF (congestive heart failure) ([place]) 11/10/2018   Hyperlipidemia 11/10/2018   Anxiety and depression 02/12/2018   History of tobacco use disorder 12/07/2016   Gout 12/07/2016   COPD (chronic obstructive pulmonary disease) ([place]) 06/07/2016   CKD (chronic kidney disease) stage 5, GFR less than 15 ml/min ([place]) 06/07/2016   Essential hypertension 06/07/2016   OSA (obstructive sleep apnea) 11/03/2015   PCP:  Charlott Rakes, MD Pharmacy:   [facility] [place] [place] - [address] [place] ([place]) [place] [address] Phone: [phone] Fax: [place] [address] [place] [address] Phone: [phone] Fax: [phone]     Social Determinants of Health (SDOH) Interventions    Readmission Risk Interventions    09/25/2022    1:43 PM  Readmission Risk Prevention Plan  Transportation Screening Complete  Medication Review (RN Care Manager) Complete  PCP or Specialist appointment  within 3-5 days of discharge Complete  HRI or Home Care Consult Complete  Palliative Care Screening Not [place] Not Applicable

## 2022-09-25 NOTE — Discharge Summary (Signed)
Physician Discharge Summary   Patient: Johnny [name] MRN: [id number] DOB: Jun 02, 1961  Admit date:     09/20/2022  Discharge date: 09/25/22  Discharge Physician: Jimmy Picket Steve Gregg   PCP: Charlott Rakes, MD   Recommendations at discharge:    Patient has been placed on torsemide 100 mg daily and will need close follow up as outpatient, he is very close to initiation of renal replacement therapy. Left upper extremity fistula is functioning. Follow up renal function as outpatient per Nephrology.   Discharge Diagnoses: Principal Problem:   CKD (chronic kidney disease) stage 5, GFR less than 15 ml/min (HCC) Active Problems:   Acute on chronic diastolic CHF (congestive heart failure) (HCC)   Essential hypertension   Hyperlipidemia   Hypothyroidism   Gout   COPD (chronic obstructive pulmonary disease) (HCC)   OSA (obstructive sleep apnea)  Resolved Problems:   * No resolved hospital problems. [facility] Course: Mr. Yale was admitted to the hospital with the working diagnosis of volume overload.   61 yo male with the past medical history of hypertension, hepatitis C, CKD stage V (AV fistula in place), COPD and heart failure who presented with dyspnea. Patient reported 2 weeks of worsening dyspnea, to the point where he is symptomatic with minimal efforts, positive orthopnea and lower extremity edema. Positive weight gain over last 6 months. On his initial physical examination is blood pressure was 143/87, HR 78, RR 16 and 02 saturation 100%, positive JVD, lungs with no wheezing, or rales, heart with S1 and S2 present and rhythmic, abdomen with no distention, positive lower extremity edema +++ pitting.   NA 138, K 4,2 CL 109 bicarbonate 20, glucose 113 bun 93 cr 8,28  BNP 1,655  Wbc 10.7 hgb 7.9 plt 187   Chest radiograph with mild cardiomegaly, symmetric bilateral interstitial infiltrates with fluid in the right fissure.   EKG 77 bpm, normal axis and normal intervals, sinus  rhythm with no significant ST segment, negative T waves V4 to V6.   Patient was placed on high doses of furosemide for diuresis.   11/19 volume has been improving, but continue with significant volume overload. His BUN continue to rise.  Patient may need to start on renal replacement therapy on this hospitalization.  11/20 volume status continue to improve with diuresis but serum cr not yet stable and continue to have high BUN.    Assessment and Plan: * CKD (chronic kidney disease) stage 5, GFR less than 15 ml/min (HCC) Patient with progressive renal disease, serum cr has been stable at 9 level.   Patient was placed on aggressive diuresis with furosemide IV and received one dose of oral metolazone. Negative fluid balance was achieved -11,929 ml, with significant improvement in his symptoms.  Had IV albumin x2   At the time of his discharge his serum cr is 9.5 with K at 3,6 and serum bicarbonate at 25. BUN 126, anion gap is 17   Transitioned to oral torsemide 100 mg daily.  Functioning left upper extremity fistula, created in 5176   Metabolic bone disease, continue with calcitriol.  Anemia of chronic renal disease continue with EPO.  For metabolic acidosis continue with oral sodium bicarbonate   Follow up renal function and electrolytes as outpatient.   Acute on chronic diastolic CHF (congestive heart failure) ([place]) Acute pulmonary edema due to progressive renal failure.  Echocardiogram with preserved LV systolic function with EF 55 to 16%, RV systolic function preserved, small pericardial effusion, no significant valvular disease.  IV furosemide high dose on admission.  11/17 one dose of metolazone  His volume continue to improve, at the time of his discharge he will continue diuresis with oral torsemide.  Continue blood pressure control with amlodipine, labetalol, and isosorbide.   Essential hypertension Continue with isosorbide, amlodipine and labetalol Diuresis with  torsemide   Hyperlipidemia Continue with statin therapy   Hypothyroidism Levothyroxine   Gout Positive acute gout flare on right foot  Patient will continue taking allopurinol and will add prednisone 20 mg for the next 3 days.  Follow up as outpatient.   COPD (chronic obstructive pulmonary disease) (HCC) No clinical signs of exacerbation   OSA (obstructive sleep apnea) Home Cpap.          Consultants: nephrology  Procedures performed: none   Disposition: Home Diet recommendation:  Cardiac diet DISCHARGE MEDICATION: Allergies as of 09/25/2022       Reactions   Naproxen Other (See Comments)   Unknown reaction    Shellfish Allergy Itching        Medication List     STOP taking these medications    cyclobenzaprine 7.5 MG tablet Commonly known as: FEXMID   furosemide 40 MG tablet Commonly known as: LASIX       TAKE these medications    acetaminophen 325 MG tablet Commonly known as: Tylenol Take 2 tablets (650 mg total) by mouth every 6 (six) hours as needed for moderate pain or mild pain.   allopurinol 100 MG tablet Commonly known as: ZYLOPRIM Take 1 tablet (100 mg total) by mouth daily.   amLODipine 10 MG tablet Commonly known as: NORVASC Take 1 tablet (10 mg total) by mouth daily.   atorvastatin 40 MG tablet Commonly known as: LIPITOR Take 1 tablet (40 mg total) by mouth daily.   calcitRIOL 0.5 MCG capsule Commonly known as: ROCALTROL Take 0.5 mcg by mouth daily.   Dulera 100-5 MCG/ACT Aero Generic drug: mometasone-formoterol Inhale 2 puffs into the lungs 2 (two) times daily.   ipratropium-albuterol 0.5-2.5 (3) MG/3ML Soln Commonly known as: DUONEB Inhale 3 mLs into the lungs every 6 (six) hours as needed.   isosorbide mononitrate 30 MG 24 hr tablet Commonly known as: IMDUR Take 1 tablet (30 mg total) by mouth daily.   labetalol 300 MG tablet Commonly known as: NORMODYNE Take 1 tablet (300 mg total) by mouth 2 (two) times  daily.   levothyroxine 100 MCG tablet Commonly known as: SYNTHROID Take 1 tablet (100 mcg total) by mouth daily.   predniSONE 20 MG tablet Commonly known as: DELTASONE Take 1 tablet (20 mg total) by mouth daily with breakfast for 3 days. What changed: See the new instructions.   sodium bicarbonate 650 MG tablet Take 1 tablet (650 mg total) by mouth 2 (two) times daily.   torsemide 100 MG tablet Commonly known as: DEMADEX Take 1 tablet (100 mg total) by mouth daily. Start taking on: September 26, 2022   triamcinolone cream 0.1 % Commonly known as: KENALOG APPLY  CREAM EXTERNALLY TWICE DAILY   Ventolin HFA 108 (90 Base) MCG/ACT inhaler Generic drug: albuterol Inhale 2 puffs into the lungs every 6 (six) hours as needed for wheezing or shortness of breath.        Discharge Exam: Filed Weights   09/23/22 0100 09/24/22 0448 09/25/22 0238  Weight: 95.3 kg 91.9 kg 90.4 kg   BP 119/77   Pulse 84   Temp 98 F (36.7 C) (Oral)   Resp 18   Ht 6'  1" (1.854 m)   Wt 90.4 kg   SpO2 97%   BMI 26.28 kg/m   Patient with no dyspnea or lower extremity edema. Right foot pain   Neurology awake and alert ENT with mild pallor Cardiovascular with S1 and S2 present and rhythmic with no gallops, rubs or murmurs Respiratory with no rales or wheezing, no rhonchi  Abdomen with no distention  No lower extremity edema Right foot forefoot pain to palpation, no local edema or erythema,   Condition at discharge: stable  The results of significant diagnostics from this hospitalization (including imaging, microbiology, ancillary and laboratory) are listed below for reference.   Imaging Studies: ECHOCARDIOGRAM COMPLETE  Result Date: 09/21/2022    ECHOCARDIOGRAM REPORT   Patient Name:   Ustin ELIER ZELLARS Date of Exam: 09/21/2022 Medical Rec #:  [id number]     Height:       73.0 in Accession #:    [id number]    Weight:       200.0 lb Date of Birth:  July 02, 1961     BSA:          2.152 m Patient Age:     9 years      BP:           151/90 mmHg Patient Gender: M             HR:           78 bpm. Exam Location:  Inpatient Procedure: 2D Echo Indications:    CHF  History:        Patient has prior history of Echocardiogram examinations, most                 recent 11/29/2021. COPD; Risk Factors:Hypertension.  Sonographer:    Harvie Junior Referring Phys: [place]  1. Left ventricular ejection fraction, by estimation, is 55 to 60%. The left ventricle has normal function. The left ventricle has no regional wall motion abnormalities. Left ventricular diastolic parameters are consistent with Grade II diastolic dysfunction (pseudonormalization).  2. Right ventricular systolic function is normal. The right ventricular size is mildly enlarged. There is normal pulmonary artery systolic pressure. The estimated right ventricular systolic pressure is 82.9 mmHg.  3. Left atrial size was mild to moderately dilated.  4. A small pericardial effusion is present. The pericardial effusion is circumferential.  5. The mitral valve is grossly normal. Mild mitral valve regurgitation. No evidence of mitral stenosis.  6. The aortic valve is tricuspid. Aortic valve regurgitation is not visualized. No aortic stenosis is present.  7. The inferior vena cava is normal in size with greater than 50% respiratory variability, suggesting right atrial pressure of 3 mmHg. Comparison(s): No significant change from prior study. FINDINGS  Left Ventricle: Left ventricular ejection fraction, by estimation, is 55 to 60%. The left ventricle has normal function. The left ventricle has no regional wall motion abnormalities. The left ventricular internal cavity size was normal in size. There is  no left ventricular hypertrophy. Left ventricular diastolic parameters are consistent with Grade II diastolic dysfunction (pseudonormalization). Right Ventricle: The right ventricular size is mildly enlarged. No increase in right ventricular wall  thickness. Right ventricular systolic function is normal. There is normal pulmonary artery systolic pressure. The tricuspid regurgitant velocity is 2.65  m/s, and with an assumed right atrial pressure of 3 mmHg, the estimated right ventricular systolic pressure is 56.2 mmHg. Left Atrium: Left atrial size was mild to moderately dilated. Right Atrium: Right atrial size was  normal in size. Pericardium: A small pericardial effusion is present. The pericardial effusion is circumferential. Mitral Valve: The mitral valve is grossly normal. Mild mitral valve regurgitation. No evidence of mitral valve stenosis. Tricuspid Valve: The tricuspid valve is grossly normal. Tricuspid valve regurgitation is trivial. No evidence of tricuspid stenosis. Aortic Valve: The aortic valve is tricuspid. Aortic valve regurgitation is not visualized. No aortic stenosis is present. Aortic valve mean gradient measures 7.0 mmHg. Aortic valve peak gradient measures 10.5 mmHg. Aortic valve area, by VTI measures 2.86  cm. Pulmonic Valve: The pulmonic valve was grossly normal. Pulmonic valve regurgitation is not visualized. No evidence of pulmonic stenosis. Aorta: The aortic root is normal in size and structure. Venous: The right lower pulmonary vein is normal. The inferior vena cava is normal in size with greater than 50% respiratory variability, suggesting right atrial pressure of 3 mmHg. IAS/Shunts: There is right bowing of the interatrial septum, suggestive of elevated left atrial pressure. The atrial septum is grossly normal.  LEFT VENTRICLE PLAX 2D LVIDd:         5.90 cm      Diastology LVIDs:         3.80 cm      LV e' medial:    8.16 cm/s LV PW:         1.10 cm      LV E/e' medial:  15.1 LV IVS:        1.00 cm      LV e' lateral:   7.29 cm/s LVOT diam:     2.10 cm      LV E/e' lateral: 16.9 LV SV:         89 LV SV Index:   41 LVOT Area:     3.46 cm  LV Volumes (MOD) LV vol d, MOD A2C: 169.0 ml LV vol d, MOD A4C: 158.0 ml LV vol s, MOD A2C:  68.9 ml LV vol s, MOD A4C: 77.0 ml LV SV MOD A2C:     100.1 ml LV SV MOD A4C:     158.0 ml LV SV MOD BP:      95.8 ml RIGHT VENTRICLE RV Basal diam:  4.50 cm RV Mid diam:    3.20 cm RV S prime:     19.70 cm/s TAPSE (M-mode): 2.9 cm LEFT ATRIUM            Index        RIGHT ATRIUM           Index LA diam:      4.70 cm  2.18 cm/m   RA Area:     19.30 cm LA Vol (A2C): 111.0 ml 51.59 ml/m  RA Volume:   54.30 ml  25.24 ml/m LA Vol (A4C): 69.2 ml  32.16 ml/m  AORTIC VALVE                     PULMONIC VALVE AV Area (Vmax):    2.67 cm      PV Vmax:          1.13 m/s AV Area (Vmean):   2.50 cm      PV Peak grad:     5.1 mmHg AV Area (VTI):     2.86 cm      PR End Diast Vel: 8.29 msec AV Vmax:           162.00 cm/s AV Vmean:          125.000 cm/s AV VTI:  0.311 m AV Peak Grad:      10.5 mmHg AV Mean Grad:      7.0 mmHg LVOT Vmax:         125.00 cm/s LVOT Vmean:        90.400 cm/s LVOT VTI:          0.257 m LVOT/AV VTI ratio: 0.83  AORTA Ao Root diam: 3.20 cm MITRAL VALVE                TRICUSPID VALVE MV Area (PHT): 5.16 cm     TR Peak grad:   28.1 mmHg MV Decel Time: 147 msec     TR Vmax:        265.00 cm/s MR Peak grad: 81.9 mmHg MR Vmax:      452.50 cm/s   SHUNTS MV E velocity: 123.00 cm/s  Systemic VTI:  0.26 m MV A velocity: 60.80 cm/s   Systemic Diam: 2.10 cm MV E/A ratio:  2.02 Eleonore Chiquito MD Electronically signed by Eleonore Chiquito MD Signature Date/Time: 09/21/2022/11:11:33 AM    Final    DG Chest 2 View  Result Date: 09/20/2022 CLINICAL DATA:  Shortness of breath EXAM: CHEST - 2 VIEW COMPARISON:  05/23/2022, 11/28/2021 FINDINGS: Cardiac silhouette is enlarged, increased in diameter from the most recent comparison study of 05/23/2022, although similar in size to 11/28/2021. Pulmonary vascular congestion. Increased interstitial markings bilaterally with more prominent interstitial opacities in the right lower lobe and right upper lobe. No pleural effusion or pneumothorax. IMPRESSION: 1.  Findings suggest CHF with interstitial edema. 2. More prominent interstitial opacities in the right lower lobe and right upper lobe, likely asymmetric edema. Superimposed infection not excluded. 3. Cardiac silhouette has increased in size compared to the most recent chest x-ray. Underlying pericardial effusion is not excluded. Electronically Signed   By: Davina Poke D.O.   On: 09/20/2022 14:08    Microbiology: Results for orders placed or performed during the hospital encounter of 06/05/22  Group A Strep by PCR     Status: Abnormal   Collection Time: 06/05/22  5:31 PM   Specimen: Throat; Sterile Swab  Result Value Ref Range Status   Group A Strep by PCR DETECTED (A) NOT DETECTED Final    Comment: Performed at [facility], [address], [place], [place] [address]  Culture, blood (Routine X 2) w Reflex to ID Panel     Status: None   Collection Time: 06/05/22 11:39 PM   Specimen: BLOOD RIGHT HAND  Result Value Ref Range Status   Specimen Description BLOOD RIGHT HAND  Final   Special Requests   Final    BOTTLES DRAWN AEROBIC AND ANAEROBIC Blood Culture results may not be optimal due to an inadequate volume of blood received in culture bottles   Culture   Final    NO GROWTH 5 DAYS Performed at [facility], [place] [address], [place], [place] [address]    Report Status 06/10/2022 FINAL  Final  Culture, blood (Routine X 2) w Reflex to ID Panel     Status: None   Collection Time: 06/05/22 11:55 PM   Specimen: BLOOD RIGHT FOREARM  Result Value Ref Range Status   Specimen Description BLOOD RIGHT FOREARM  Final   Special Requests   Final    BOTTLES DRAWN AEROBIC AND ANAEROBIC Blood Culture results may not be optimal due to an inadequate volume of blood received in culture bottles   Culture   Final  NO GROWTH 5 DAYS Performed at [facility], [place] [address] [place], [place] [address]    Report Status 06/11/2022 FINAL  Final  MRSA Next Gen by PCR, Nasal     Status:  Abnormal   Collection Time: 06/06/22 12:37 AM   Specimen: Nasal Mucosa; Nasal Swab  Result Value Ref Range Status   MRSA by PCR Next Gen DETECTED (A) NOT DETECTED Final    Comment: RESULT CALLED TO, READ BACK BY AND VERIFIED WITH: RN ERICA SULLIVAN 06/06/22'@1'$ :58 BY TW (NOTE) The GeneXpert MRSA Assay (FDA approved for NASAL specimens only), is one component of a comprehensive MRSA colonization surveillance program. It is not intended to diagnose MRSA infection nor to guide or monitor treatment for MRSA infections. Test performance is not FDA approved in patients less than 62 years old. Performed at [facility], [place] [address] [place], [place] [address]     Labs: CBC: Recent Labs  Lab 09/20/22 1334 09/21/22 0347  WBC 10.7* 10.5  NEUTROABS  --  9.1*  HGB 7.9* 8.7*  HCT 23.4* 25.3*  MCV 73.4* 73.3*  PLT 187 314   Basic Metabolic Panel: Recent Labs  Lab 09/21/22 0347 09/22/22 0040 09/23/22 0037 09/24/22 0040 09/25/22 0609  NA 138 136 138 136 137  K 4.6 4.1 4.0 4.0 3.6  CL 108 104 103 99 95*  CO2 19* 18* 19* 22 25  GLUCOSE 141* 138* 114* 102* 101*  BUN 95* 113* 126* 128* 126*  CREATININE 8.20* 8.69* 9.00* 9.35* 9.50*  CALCIUM 8.0* 8.1* 8.1* 8.5* 8.5*  MG 1.9  --   --   --   --   PHOS  --  3.7 3.8 3.8 4.6   Liver Function Tests: Recent Labs  Lab 09/22/22 0040 09/23/22 0037 09/24/22 0040 09/25/22 0609  ALBUMIN 2.9* 2.7* 3.0* 3.0*   CBG: No results for input(s): "GLUCAP" in the last 168 hours.  Discharge time spent: greater than 30 minutes.  Signed: Tawni Millers, MD [facility] 09/25/2022

## 2022-09-25 NOTE — TOC Transition Note (Signed)
Transition of Care [facility]) - CM/SW Discharge Note   Patient Details  Name: JAYCOB MCCLENTON MRN: [id number] Date of Birth: [birth year]  Transition [facility]) CM/SW Contact:  Zenon Mayo, RN Phone Number: 09/25/2022, 1:50 PM   Clinical Narrative:    Patient is for dc today, friend is at the bedside, she states they will be taking a taxi home , patient states he will be paying for the taxie.      Final next level of care: Home/Self Care Barriers to Discharge: No Barriers Identified      Patient Goals and CMS Choice Patient states their goals for this hospitalization and ongoing recovery are:: return home   Choice offered to / list presented to : NA  Discharge Placement                       Discharge Plan and Services In-house Referral: NA Discharge Planning Services: CM Consult Post Acute Care Choice: NA          DME Arranged: N/A DME Agency: NA                  Social Determinants of Health (SDOH) Interventions     Readmission Risk Interventions    09/25/2022    1:43 PM  Readmission Risk Prevention Plan  Transportation Screening Complete  Medication Review ([place]) Complete  PCP or Specialist appointment within 3-5 days of discharge Complete  HRI or [place] Not Applicable

## 2022-09-25 NOTE — Progress Notes (Signed)
[name] NEPHROLOGY PROGRESS NOTE  Assessment/ Plan: Pt is a 61 y.o. yo male  with history of hypertension, OSA, hep C, COPD, CHF, CKD stage V with baseline serum creatinine level around 7-8, presented with worsening shortness of breath for about a week, seen as a consultation for the evaluation of CKD 5 with fluid overload. He  follows with Dr. Joylene Grapes at [facility]    # CKD stage V with fluid overload, nonoliguric: Presented with volume overload.  Baseline creatinine 7-8.  Has a left upper extremity AV fistula -Creatinine slightly higher and urine output continues to be good with diuretics -BUN is stable for the past several days -Patient continues to be hesitant about starting dialysis so demonstrates no uremic symptoms -Continue torsemide at 100 mg daily and gave strict instructions to hold the medication and call our office if he feels like he is getting dehydrated -I think he is stable for discharge.  I will set up close outpatient follow-up   #Acute on chronic diastolic CHF: Echo with EF 55 to 95%, grade 2 diastolic dysfunction.  Continue IV diuretics as above.     #Metabolic acidosis: continue oral sodium bicarbonate   #Hypertension/volume: Continue current antihypertensives include amlodipine, Imdur and diuretics per the volume management.   #Anemia of CKD: Iron saturation 28 and ferritin 558.  Received Aranesp on 11/18.  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  Monitor calcium and phosphorus level.  Subjective: Patient states he feels great today.  Urine output continues to be good.  No significant shortness of breath, nausea, vomiting, decreased appetite  Objective Vital signs in last 24 hours: Vitals:   09/25/22 0542 09/25/22 0751 09/25/22 0821 09/25/22 0822  BP: (!) 160/103  139/78   Pulse: 82 80  78  Resp: 16 18    Temp: 97.7 F (36.5 C)     TempSrc: Oral     SpO2: 96% 99%    Weight:      Height:       Weight change: -1.588 kg  Intake/Output Summary  (Last 24 hours) at 09/25/2022 0927 Last data filed at 09/25/2022 0753 Gross per 24 hour  Intake 837 ml  Output 1900 ml  Net -1063 ml       Labs: RENAL PANEL Recent Labs    11/29/21 0252 11/29/21 0536 11/30/21 0447 05/23/22 0759 05/24/22 0341 06/05/22 1852 06/05/22 2355 06/07/22 0508 09/20/22 1334 09/21/22 0347 09/22/22 0040 09/23/22 0037 09/24/22 0040 09/25/22 0609  NA  --    < > 135   < > 134* 139 142 132* 139 138 136 138 136 137  K  --    < > 4.4   < > 4.3 4.0 4.2 4.3 4.2 4.6 4.1 4.0 4.0 3.6  CL  --    < > 105   < > 102 110 112* 102 109 108 104 103 99 95*  CO2  --    < > 20*   < > 17* 19* 15* 18* 20* 19* 18* 19* 22 25  GLUCOSE  --    < > 113*   < > 128* 114* 105* 150* 113* 141* 138* 114* 102* 101*  BUN  --    < > 60*   < > 91* 97* 96* 96* 93* 95* 113* 126* 128* 126*  CREATININE  --    < > 6.02*   < > 8.29* 7.92* 7.43* 7.34* 8.28* 8.20* 8.69* 9.00* 9.35* 9.50*  CALCIUM  --    < > 7.9*   < >  8.8* 9.0 9.1 8.7* 7.4* 8.0* 8.1* 8.1* 8.5* 8.5*  MG 1.9  --   --   --   --   --  2.0  --   --  1.9  --   --   --   --   PHOS  --   --  4.1  --  3.2  --  5.2*  --   --   --  3.7 3.8 3.8 4.6  ALBUMIN 2.6*  --  2.9*  --  3.2*  --  3.2*  --   --   --  2.9* 2.7* 3.0* 3.0*   < > = values in this interval not displayed.     Liver Function Tests: Recent Labs  Lab 09/23/22 0037 09/24/22 0040 09/25/22 0609  ALBUMIN 2.7* 3.0* 3.0*   No results for input(s): "LIPASE", "AMYLASE" in the last 168 hours. No results for input(s): "AMMONIA" in the last 168 hours. CBC: Recent Labs    11/29/21 1925 05/23/22 0759 06/05/22 1852 06/05/22 2355 06/06/22 0012 06/07/22 0508 09/20/22 1334 09/21/22 0347 09/22/22 0040  HGB  --    < > 10.8* 10.7*  --  10.5* 7.9* 8.7*  --   MCV  --    < > 76.2* 76.8*  --  74.3* 73.4* 73.3*  --   FERRITIN 427*  --   --   --  604*  --   --   --  558*  TIBC 293  --   --   --  242*  --   --   --  290  IRON 51  --   --   --  21*  --   --   --  80   < > = values  in this interval not displayed.    Cardiac Enzymes: No results for input(s): "CKTOTAL", "CKMB", "CKMBINDEX", "TROPONINI" in the last 168 hours. CBG: No results for input(s): "GLUCAP" in the last 168 hours.  Iron Studies:  No results for input(s): "IRON", "TIBC", "TRANSFERRIN", "FERRITIN" in the last 72 hours.  Studies/Results: No results found.  Medications: Infusions:  sodium chloride      Scheduled Medications:  allopurinol  100 mg Oral Daily   amLODipine  10 mg Oral Daily   atorvastatin  40 mg Oral QHS   calcitRIOL  0.5 mcg Oral Daily   heparin  5,000 Units Subcutaneous Q8H   isosorbide mononitrate  30 mg Oral Daily   labetalol  300 mg Oral BID   levothyroxine  100 mcg Oral Daily   mometasone-formoterol  2 puff Inhalation BID   sodium bicarbonate  1,300 mg Oral BID   sodium chloride flush  3 mL Intravenous Q12H   torsemide  100 mg Oral Daily    have reviewed scheduled and prn medications.  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

## 2022-09-25 NOTE — Assessment & Plan Note (Signed)
Home Cpap.

## 2022-10-01 ENCOUNTER — Telehealth: Payer: Self-pay

## 2022-10-01 NOTE — Telephone Encounter (Signed)
Transition Care Management Unsuccessful Follow-up Telephone Call  Date of discharge and from where:  09/25/2022, [facility]  Attempts:  1st Attempt  Reason for unsuccessful TCM follow-up call:  Voice mail full # [phone].

## 2022-10-02 ENCOUNTER — Other Ambulatory Visit: Payer: Self-pay | Admitting: Family Medicine

## 2022-10-02 ENCOUNTER — Telehealth: Payer: Self-pay

## 2022-10-02 DIAGNOSIS — M1A00X0 Idiopathic chronic gout, unspecified site, without tophus (tophi): Secondary | ICD-10-CM

## 2022-10-02 NOTE — Telephone Encounter (Signed)
Transition Care Management Unsuccessful Follow-up Telephone Call  Date of discharge and from where:  09/25/2022, [facility]  Attempts:  2nd Attempt  Reason for unsuccessful TCM follow-up call:  Voice mail full - [phone]   Need to schedule a hospital follow up appointment

## 2022-10-03 ENCOUNTER — Telehealth: Payer: Self-pay

## 2022-10-03 NOTE — Telephone Encounter (Signed)
Transition Care Management Unsuccessful Follow-up Telephone Call  Date of discharge and from where:  09/25/2022, [facility]  Attempts:  3rd Attempt  Reason for unsuccessful TCM follow-up call:  Voice mail full [phone]    Letter sent to patient requesting he contact [facility] to schedule a follow up appointment as we have not been able to reach him.

## 2022-10-08 ENCOUNTER — Other Ambulatory Visit: Payer: Self-pay | Admitting: Family Medicine

## 2022-10-08 ENCOUNTER — Telehealth: Payer: Self-pay | Admitting: Emergency Medicine

## 2022-10-08 DIAGNOSIS — M1A00X0 Idiopathic chronic gout, unspecified site, without tophus (tophi): Secondary | ICD-10-CM

## 2022-10-08 NOTE — Telephone Encounter (Signed)
Routing to PCP for review.

## 2022-10-08 NOTE — Telephone Encounter (Signed)
Copied from [place] [id number]. Topic: General - Other >> Oct 08, 2022 10:19 AM Leilani Able wrote: Reason for CRM: Pt is wanting a note for his absences in the past couple months for severe gout, pt has not been seen since 7/23 by provider. He is wanting dr notes for the dates of Acute visits. Told pt he would need to obtain form provider seen. Pt still wants to have fu from nurse. Pt also wanting refill of pain meds has been receiving from said providers, meds not on file, told pt needs appt, denies appt. Pt wants fu from Dr Smitty Pluck nurse [phone]

## 2022-10-09 ENCOUNTER — Telehealth: Payer: Medicaid Other | Admitting: Physician Assistant

## 2022-10-09 ENCOUNTER — Ambulatory Visit: Payer: Self-pay | Admitting: *Deleted

## 2022-10-09 DIAGNOSIS — N185 Chronic kidney disease, stage 5: Secondary | ICD-10-CM | POA: Diagnosis not present

## 2022-10-09 DIAGNOSIS — I5033 Acute on chronic diastolic (congestive) heart failure: Secondary | ICD-10-CM | POA: Diagnosis not present

## 2022-10-09 DIAGNOSIS — M1A30X0 Chronic gout due to renal impairment, unspecified site, without tophus (tophi): Secondary | ICD-10-CM

## 2022-10-09 DIAGNOSIS — M1A00X0 Idiopathic chronic gout, unspecified site, without tophus (tophi): Secondary | ICD-10-CM

## 2022-10-09 MED ORDER — PREDNISONE 20 MG PO TABS: 40.0000 mg | ORAL_TABLET | Freq: Every day | ORAL | 0 refills | Status: DC

## 2022-10-09 NOTE — Telephone Encounter (Signed)
He will need an in person office visit to address these.  He cannot just request a note for absences in the last couple of months.  No evidence in chart of Gout visits either.

## 2022-10-09 NOTE — Telephone Encounter (Signed)
  Chief Complaint: gout flare up , knee swelling , requesting medications and note for school due to recent hospitalization Symptoms: bilateral great toe pain, ankle pain, left knee swelling worse, unable to walk. Hops from bed to chair. Requesting medication.  Frequency: since discharge from hospital.09/25/22  Pertinent Negatives: Patient denies chest pain no difficulty breathing, no fever, no calf pain.  Disposition: '[]'$ ED /'[x]'$ Urgent Care (no appt availability in office) / '[]'$ Appointment(In office/virtual)/ '[]'$  [facility]/ '[]'$ Home Care/ '[]'$ Refused Recommended Disposition /'[x]'$ [facility]/ '[]'$  Follow-up with PCP Additional Notes:   Recommended UC/ mobile bus and patient reports he can not walk. Recommended ED. Patient reports he is requesting medication. Reviewed allopurinol and patient reports he got colchicine in hospital and requesting it may work better. Requesting medication note for school due to missing classes. Patient offered UC VV. Patient reports he will also notify PCP via My Chart. Please advise.     Reason for Disposition  SEVERE swelling (e.g., can't move swollen knee at all)  Answer Assessment - Initial Assessment Questions 1. LOCATION: "Where is the swelling located?"  (e.g., left, right, both knees)     Left knee, bilateral pain gout flare up both great toes.  2. ONSET: "When did the swelling start?" "Does it come and go, or is it there all the time?"     After release from hospital admitted 09/20/22 3. SWELLING: "How bad is the swelling?" Or, "How large is it?" (e.g., mild, moderate, severe; size of localized swelling)    - NONE: No joint swelling.   - LOCALIZED: Localized; small area of puffy or swollen skin (e.g., insect bite, skin irritation).   - MILD: Joint looks or feels mildly swollen or puffy.   - MODERATE: Swollen; interferes with normal activities (e.g., work or school); can't move joint normally (bend and straighten completely); may be limping.    - SEVERE: Very swollen; can't move swollen joint at all; limping a lot or unable to walk.     Severe unable to walk due to swollen knee 4. PAIN: "Is there any pain?" If Yes, ask: "How bad is it?" (Scale 1-10; or mild, moderate, severe)   - NONE (0): no pain.   - MILD (1-3): doesn't interfere with normal activities.    - MODERATE (4-7): interferes with normal activities (e.g., work or school) or awakens from sleep, limping.    - SEVERE (8-10): excruciating pain, unable to do any normal activities, unable to walk.      Moderate to severe 5. SETTING: "Has there been any recent work, exercise or other activity that involved that part of the body?"      Recent hospital stay , developed  6. AGGRAVATING FACTORS: "What makes the knee swelling worse?" (e.g., walking, climbing stairs, running)     Reports inability to walk. Hops from bed to chair  7. ASSOCIATED SYMPTOMS: "Is there any pain or redness?"     Pain unable to assess redness due to skin color  8. OTHER SYMPTOMS: "Do you have any other symptoms?" (e.g., chest pain, difficulty breathing, fever, calf pain)     Pain bilateral great toes, left knee swelling pain in ankles  9. PREGNANCY: "Is there any chance you are pregnant?" "When was your last menstrual period?"     na  Protocols used: Knee Swelling-A-AH

## 2022-10-09 NOTE — Patient Instructions (Signed)
Johnny [name], thank you for joining Johnny Rio, PA-C for today's virtual visit.  While this provider is not your primary care provider (PCP), if your PCP is located in our provider database this encounter information will be shared with them immediately following your visit.   [facility] account gives you access to today's visit and all your visits, tests, and labs performed at [facility] " click here if you don't have a [facility] account or go to [email or web address]  Consent: (Patient) Johnny [name] provided verbal consent for this virtual visit at the beginning of the encounter.  Current Medications:  Current Outpatient Medications:    acetaminophen (TYLENOL) 325 MG tablet, Take 2 tablets (650 mg total) by mouth every 6 (six) hours as needed for moderate pain or mild pain., Disp: 30 tablet, Rfl: 1   albuterol (VENTOLIN HFA) 108 (90 Base) MCG/ACT inhaler, Inhale 2 puffs into the lungs every 6 (six) hours as needed for wheezing or shortness of breath., Disp: 18 g, Rfl: 2   allopurinol (ZYLOPRIM) 100 MG tablet, Take 1 tablet (100 mg total) by mouth daily., Disp: 30 tablet, Rfl: 0   amLODipine (NORVASC) 10 MG tablet, Take 1 tablet (10 mg total) by mouth daily., Disp: 30 tablet, Rfl: 6   atorvastatin (LIPITOR) 40 MG tablet, Take 1 tablet (40 mg total) by mouth daily., Disp: 30 tablet, Rfl: 6   calcitRIOL (ROCALTROL) 0.5 MCG capsule, Take 0.5 mcg by mouth daily., Disp: , Rfl:    ipratropium-albuterol (DUONEB) 0.5-2.5 (3) MG/3ML SOLN, Inhale 3 mLs into the lungs every 6 (six) hours as needed., Disp: 90 mL, Rfl: 0   isosorbide mononitrate (IMDUR) 30 MG 24 hr tablet, Take 1 tablet (30 mg total) by mouth daily., Disp: 30 tablet, Rfl: 2   labetalol (NORMODYNE) 300 MG tablet, Take 1 tablet (300 mg total) by mouth 2 (two) times daily., Disp: 60 tablet, Rfl: 6   levothyroxine (SYNTHROID) 100 MCG tablet, Take 1 tablet (100 mcg total) by mouth daily., Disp: 30  tablet, Rfl: 6   mometasone-formoterol (DULERA) 100-5 MCG/ACT AERO, Inhale 2 puffs into the lungs 2 (two) times daily., Disp: 13 g, Rfl: 6   predniSONE (DELTASONE) 20 MG tablet, TAKE 2 TABLETS BY MOUTH ONCE DAILY WITH BREAKFAST, Disp: 6 tablet, Rfl: 0   sodium bicarbonate 650 MG tablet, Take 1 tablet (650 mg total) by mouth 2 (two) times daily., Disp: 60 tablet, Rfl: 0   torsemide (DEMADEX) 100 MG tablet, Take 1 tablet (100 mg total) by mouth daily., Disp: 30 tablet, Rfl: 0   triamcinolone cream (KENALOG) 0.1 %, APPLY  CREAM EXTERNALLY TWICE DAILY, Disp: 45 g, Rfl: 0   Medications ordered in this encounter:  No orders of the defined types were placed in this encounter.    *If you need refills on other medications prior to your next appointment, please contact your pharmacy*  Follow-Up: Call back or seek an in-person evaluation if the symptoms worsen or if the condition fails to improve as anticipated.  [name] [phone]  Other Instructions Call Dr. Smitty Pluck office to schedule a hospital follow-up. They have been trying to set this up with you over past couple of weeks. I did send her a copy of my note and a separate message, but it is your responsibility to also call to get follow-up appointment.  Call your Kidney doctor's office ASAP to reschedule your hospital follow-up. This is extremely important.  For the gout flare,  take the prednisone as directed. Ice the areas if needed. Can take Tylenol OTC sparingly if needed for pain. Continue your allopurinol as directed.   For the knee -- this is likely a combination of some gout symptoms, but a flare of the underlying osteoarthritis in that knee that was visible on prior imaging. Ice and elevate the knee. Consider wearing an ACE wrap to add compression to further help with swelling and pain. You may need referral to Orthopedist or Sports Medicine Provider for this. You can discuss further with Dr. Margarita Rana.   If anything  worsens at all or you note any recurrence of heart failure symptoms (legs swelling, increased chest congestion, shortness of breath, inability to lay flat without getting short-of-breath, etc) or worsening pain, you need ER evaluation ASAP. The same goes if you note any decrease in urine output or darkening urine (amber/brown) before evaluation with your kidney specialist. PLEASE DO NOT DELAY CARE.   If you have been instructed to have an in-person evaluation today at a local Urgent Care facility, please use the link below. It will take you to a list of all of our available [facility], including address, phone number and hours of operation. Please do not delay care.  [facility]  If you or a family member do not have a primary care provider, use the link below to schedule a visit and establish care. When you choose a [facility] primary care physician or advanced practice provider, you gain a long-term partner in health. Find a Primary Care Provider  Learn more about [facility]'s in-office and virtual care options: [facility]

## 2022-10-09 NOTE — Progress Notes (Signed)
Virtual Visit Consent   Johnny [name], you are scheduled for a virtual visit with a [facility] provider today. Just as with appointments in the office, your consent must be obtained to participate. Your consent will be active for this visit and any virtual visit you may have with one of our providers in the next 365 days. If you have a MyChart account, a copy of this consent can be sent to you electronically.  As this is a virtual visit, video technology does not allow for your provider to perform a traditional examination. This may limit your provider's ability to fully assess your condition. If your provider identifies any concerns that need to be evaluated in person or the need to arrange testing (such as labs, EKG, etc.), we will make arrangements to do so. Although advances in technology are sophisticated, we cannot ensure that it will always work on either your end or our end. If the connection with a video visit is poor, the visit may have to be switched to a telephone visit. With either a video or telephone visit, we are not always able to ensure that we have a secure connection.  By engaging in this virtual visit, you consent to the provision of healthcare and authorize for your insurance to be billed (if applicable) for the services provided during this visit. Depending on your insurance coverage, you may receive a charge related to this service.  I need to obtain your verbal consent now. Are you willing to proceed with your visit today? [name] has provided verbal consent on 10/09/2022 for a virtual visit (video or telephone). Johnny [name], [name]  Date: 10/09/2022 4:57 PM  Virtual Visit via Video Note   I, Johnny [name], connected with  Johnny [name]  ([id number], [birth year]) on 10/09/22 at  4:15 PM EST by a video-enabled telemedicine application and verified that I am speaking with the correct person using two identifiers.  Location: Patient: Virtual Visit Location  Patient: Home Provider: Virtual Visit Location Provider: Home Office   I discussed the limitations of evaluation and management by telemedicine and the availability of in person appointments. The patient expressed understanding and agreed to proceed.    History of Present Illness: Johnny [name] is a 61 y.o. who identifies as a male who was assigned male at birth, and is being seen today for continued gout flare of great toes bilaterally with some continued swelling and pain of his left knee. Patient with substantial medical history including COPD, CKD (nearing ESRD with dialysis being contemplated per EMR review), gout, chronic diastolic heart failure with exacerbation in the past month requiring hospitalization, HTN, HLD, hypothyroidism and OSA.  Endorses recent treatment after hospitalization with 3-day course of prednisone which vastly improved symptoms in toes but they have recurrence over the past few days. Has had some persistent swelling of his left knee. Denies fever, chills. Again was hospitalized 11/16 for acute on chronic diastolic heart failure. Hospital course summarized below. For full account, please refer to appropriate EMR records.   11/16 presented to ER with volume overload and 2 weeks of progressive dyspnea to point of dyspnea with minimal exertion. Continually gaining weight for 6 months.  Initial exam revealed BP 1143/87, HR 78, RR 16 and O2 100%, positive JVD, 3+ pitting edema to bilateral lower extremities. Lungs clear. BUN 93/Cr 8.28, BNP 1655, HGB 7.9. CXR obtained and revealed mild cardiomegaly and symmetric bilateral interstitial filtrates. EKG with no acute changes. Started on high  doses of furosemide and admitted.   On day of discharge (11/21) noted to have stabilizing Cr at 9.5. Substantial diuresis with resolution of symptoms. Was started on oral torsemide to take 100 mg daily at home. Also to continue other chronic medications. Was recommended to have close follow-up with  PCP and nephrology. Was noted to have gout in R foot and started on prednisone 20 mg for 3 days in addition to chronic allopurinol. Has not followed up with PCP or Nephrologist.   Denies any recurrent swelling of legs or weight gain. Denies SOB, PND or orthopnea. Is taking all medications as directed. Had appt with Nephrology but unable to get there. Does not have another appt schedule with them or with PCP.   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   Acute on chronic diastolic CHF (congestive heart failure) ([place]) 11/10/2018   Hyperlipidemia 11/10/2018   Anxiety and depression 02/12/2018   History of tobacco use disorder 12/07/2016   Gout 12/07/2016   COPD (chronic obstructive pulmonary disease) ([place]) 06/07/2016   CKD (chronic kidney disease) stage 5, GFR less than 15 ml/min ([place]) 06/07/2016   Essential hypertension 06/07/2016   OSA (obstructive sleep apnea) 11/03/2015    Allergies:  Allergies  Allergen Reactions   Naproxen Other (See Comments)    Unknown reaction    Shellfish Allergy Itching   Medications:  Current Outpatient Medications:    acetaminophen (TYLENOL) 325 MG tablet, Take 2 tablets (650 mg total) by mouth every 6 (six) hours as needed for moderate pain or mild pain., Disp: 30 tablet, Rfl: 1   albuterol (VENTOLIN HFA) 108 (90 Base) MCG/ACT inhaler, Inhale 2 puffs into the lungs every 6 (six) hours as needed for wheezing or shortness of breath., Disp: 18 g, Rfl: 2   allopurinol (ZYLOPRIM) 100 MG tablet, Take 1 tablet (100 mg total) by mouth daily., Disp: 30 tablet, Rfl: 0   amLODipine (NORVASC) 10 MG  tablet, Take 1 tablet (10 mg total) by mouth daily., Disp: 30 tablet, Rfl: 6   atorvastatin (LIPITOR) 40 MG tablet, Take 1 tablet (40 mg total) by mouth daily., Disp: 30 tablet, Rfl: 6   calcitRIOL (ROCALTROL) 0.5 MCG capsule, Take 0.5 mcg by mouth daily., Disp: , Rfl:    ipratropium-albuterol (DUONEB) 0.5-2.5 (3) MG/3ML SOLN, Inhale 3 mLs into the lungs every 6 (six) hours as needed., Disp: 90 mL, Rfl: 0   isosorbide mononitrate (IMDUR) 30 MG 24 hr tablet, Take 1 tablet (30 mg total) by mouth daily., Disp: 30 tablet, Rfl: 2   labetalol (NORMODYNE) 300 MG tablet, Take 1 tablet (300 mg total) by mouth 2 (two) times daily., Disp: 60 tablet, Rfl: 6   levothyroxine (SYNTHROID) 100 MCG tablet, Take 1 tablet (100 mcg total) by mouth daily., Disp: 30 tablet, Rfl: 6   mometasone-formoterol (DULERA) 100-5 MCG/ACT AERO, Inhale 2 puffs into the lungs 2 (two) times daily., Disp: 13 g, Rfl: 6   predniSONE (DELTASONE) 20 MG tablet, TAKE 2 TABLETS BY MOUTH ONCE DAILY WITH BREAKFAST, Disp: 6 tablet, Rfl: 0   sodium bicarbonate 650 MG tablet, Take 1 tablet (650 mg total) by  mouth 2 (two) times daily., Disp: 60 tablet, Rfl: 0   torsemide (DEMADEX) 100 MG tablet, Take 1 tablet (100 mg total) by mouth daily., Disp: 30 tablet, Rfl: 0   triamcinolone cream (KENALOG) 0.1 %, APPLY  CREAM EXTERNALLY TWICE DAILY, Disp: 45 g, Rfl: 0  Observations/Objective: Patient is well-developed, well-nourished in no acute distress.  Resting comfortably at home.  Head is normocephalic, atraumatic.  No labored breathing. Speech is clear and coherent with logical content.  Patient is alert and oriented at baseline.   Assessment and Plan: 1. Chronic gout due to renal impairment without tophus, unspecified site  Suspect also flared by amount of diuretics he is also taking, which unfortunately cannot be avoided giving other issues. Continue allopurinol. Will give one-time course of prednisone for 5 days. He needs Nephrology and PCP  follow-up ASAP. Will message his PCP. He is to call his specialist office to reschedule missed appointment.   2. CKD (chronic kidney disease) stage 5, GFR less than 15 ml/min [facility])  Needs Nephrology follow-up ASAP giving change in creatinine and likely new baseline with need to proceed with dialysis.   3. Acute on chronic diastolic CHF (congestive heart failure) (HCC)  Seems to be at baseline with lack of leg swelling and no respiratory symptoms. Continue current regimen. Needs frequent monitoring and possible Cardiology referral giving other comorbid conditions. He is to follow-up with PCP. Strict ER precautions reviewed.   Follow Up Instructions: I discussed the assessment and treatment plan with the patient. The patient was provided an opportunity to ask questions and all were answered. The patient agreed with the plan and demonstrated an understanding of the instructions.  A copy of instructions were sent to the patient via MyChart unless otherwise noted below.   The patient was advised to call back or seek an in-person evaluation if the symptoms worsen or if the condition fails to improve as anticipated.  Time:  I spent 25 minutes with the patient via telehealth technology discussing the above problems/concerns.    Johnny Rio, PA-C

## 2022-10-09 NOTE — Telephone Encounter (Signed)
Patient called back to report he is still in severe pain from previous NT encounter. No recommendations noted from PCP at this time. Scheduled patient UC VV via my chart for today 10/09/22 at 4:15pm. Recommended cool compress to knees swelling, stop eating red meat at this time. Try to eat cherries to see if decreases gout pain. Recommended to patient if pain worsen he will need to go to ED for evaluation. Patient verbalized understanding . Patient reports he did not go to UC or mobile bus due to no transportation.

## 2022-10-10 ENCOUNTER — Telehealth: Payer: Self-pay | Admitting: Family Medicine

## 2022-10-10 NOTE — Telephone Encounter (Signed)
Noted  

## 2022-10-10 NOTE — Telephone Encounter (Signed)
Patient wanted to inform PCP as per 10/09/2022 video visit with PA under History of Present Illness  paragraph 3 it implies patient has not follow up with PCP or neurologist which is true.  Patient states he would like it noted the reason why because he had a gout flare up and was unable to walk.  Patient states he is calling his neurologist now to schedule appointment and has an hospital follow up scheduled on 11/07/2021.

## 2022-10-10 NOTE — Telephone Encounter (Signed)
Attempted to contact patient and mailbox is currently full.

## 2022-10-19 DIAGNOSIS — I5030 Unspecified diastolic (congestive) heart failure: Secondary | ICD-10-CM | POA: Diagnosis not present

## 2022-10-19 DIAGNOSIS — N2581 Secondary hyperparathyroidism of renal origin: Secondary | ICD-10-CM | POA: Diagnosis not present

## 2022-10-19 DIAGNOSIS — E872 Acidosis, unspecified: Secondary | ICD-10-CM | POA: Diagnosis not present

## 2022-10-19 DIAGNOSIS — I120 Hypertensive chronic kidney disease with stage 5 chronic kidney disease or end stage renal disease: Secondary | ICD-10-CM | POA: Diagnosis not present

## 2022-10-19 DIAGNOSIS — I770 Arteriovenous fistula, acquired: Secondary | ICD-10-CM | POA: Diagnosis not present

## 2022-10-19 DIAGNOSIS — E785 Hyperlipidemia, unspecified: Secondary | ICD-10-CM | POA: Diagnosis not present

## 2022-10-19 DIAGNOSIS — N185 Chronic kidney disease, stage 5: Secondary | ICD-10-CM | POA: Diagnosis not present

## 2022-10-19 DIAGNOSIS — D631 Anemia in chronic kidney disease: Secondary | ICD-10-CM | POA: Diagnosis not present

## 2022-10-22 ENCOUNTER — Other Ambulatory Visit: Payer: Self-pay | Admitting: Family Medicine

## 2022-10-22 DIAGNOSIS — I10 Essential (primary) hypertension: Secondary | ICD-10-CM

## 2022-10-23 ENCOUNTER — Telehealth: Payer: Self-pay | Admitting: Family Medicine

## 2022-10-23 DIAGNOSIS — M1A30X0 Chronic gout due to renal impairment, unspecified site, without tophus (tophi): Secondary | ICD-10-CM

## 2022-10-23 NOTE — Telephone Encounter (Signed)
Requested medication (s) are due for refill today - provider review   Requested medication (s) are on the active medication list -yes  Future visit scheduled -yes  Last refill: 10/09/22 #10  Notes to clinic: non delegated Rx, outside provider  Requested Prescriptions  Pending Prescriptions Disp Refills   predniSONE (DELTASONE) 20 MG tablet [Pharmacy Med Name: predniSONE 20 MG Oral Tablet] 6 tablet 0    Sig: TAKE 2 TABLETS BY MOUTH ONCE DAILY WITH BREAKFAST     Not Delegated - Endocrinology:  Oral Corticosteroids Failed - 10/23/2022  2:22 PM      Failed - This refill cannot be delegated      Failed - Manual Review: Eye exam for IOP if prolonged treatment      Failed - Glucose (serum) in normal range and within 180 days    Glucose, Bld  Date Value Ref Range Status  09/25/2022 101 (H) 70 - 99 mg/dL Final    Comment:    Glucose reference range applies only to samples taken after fasting for at least 8 hours.         Failed - Bone Mineral Density or Dexa Scan completed in the last 2 years      57 - K in normal range and within 180 days    Potassium  Date Value Ref Range Status  09/25/2022 3.6 3.5 - 5.1 mmol/L Final         Passed - Na in normal range and within 180 days    Sodium  Date Value Ref Range Status  09/25/2022 137 135 - 145 mmol/L Final  11/08/2020 135 134 - 144 mmol/L Final         Passed - Last BP in normal range    BP Readings from Last 1 Encounters:  09/25/22 119/77         Passed - Valid encounter within last 6 months    Recent Outpatient Visits           4 months ago Chronic kidney disease (CKD), stage V ([place])   [place], Charlane Ferretti, MD   4 months ago Right foot pain   [place], [place], MD   6 months ago Idiopathic chronic gout without tophus, unspecified site   [place], [place], [name]   7 months ago Mixed simple and  mucopurulent chronic bronchitis [facility])   [place], Vernia Buff, NP   10 months ago Hospital discharge follow-up   [place], MD       Future Appointments             In 2 weeks Thereasa Solo, Dionne Bucy, PA-C [place]               Requested Prescriptions  Pending Prescriptions Disp Refills   predniSONE (DELTASONE) 20 MG tablet [Pharmacy Med Name: predniSONE 20 MG Oral Tablet] 6 tablet 0    Sig: TAKE 2 TABLETS BY MOUTH ONCE DAILY WITH BREAKFAST     Not Delegated - Endocrinology:  Oral Corticosteroids Failed - 10/23/2022  2:22 PM      Failed - This refill cannot be delegated      Failed - Manual Review: Eye exam for IOP if prolonged treatment      Failed - Glucose (serum) in normal range and within 180 days  Glucose, Bld  Date Value Ref Range Status  09/25/2022 101 (H) 70 - 99 mg/dL Final    Comment:    Glucose reference range applies only to samples taken after fasting for at least 8 hours.         Failed - Bone Mineral Density or Dexa Scan completed in the last 2 years      22 - K in normal range and within 180 days    Potassium  Date Value Ref Range Status  09/25/2022 3.6 3.5 - 5.1 mmol/L Final         Passed - Na in normal range and within 180 days    Sodium  Date Value Ref Range Status  09/25/2022 137 135 - 145 mmol/L Final  11/08/2020 135 134 - 144 mmol/L Final         Passed - Last BP in normal range    BP Readings from Last 1 Encounters:  09/25/22 119/77         Passed - Valid encounter within last 6 months    Recent Outpatient Visits           4 months ago Chronic kidney disease (CKD), stage V ([place])   [place], Charlane Ferretti, MD   4 months ago Right foot pain   [place], [place], MD   6 months ago Idiopathic chronic gout without tophus, unspecified site    [place], [name]   7 months ago Mixed simple and mucopurulent chronic bronchitis [facility])   [place], Vernia Buff, NP   10 months ago Hospital discharge follow-up   [place], MD       Future Appointments             In 2 weeks Thereasa Solo, Casimer Bilis [place]

## 2022-10-25 ENCOUNTER — Other Ambulatory Visit: Payer: Self-pay | Admitting: Pharmacist

## 2022-10-25 ENCOUNTER — Other Ambulatory Visit: Payer: Self-pay | Admitting: Family Medicine

## 2022-10-25 DIAGNOSIS — M1A30X0 Chronic gout due to renal impairment, unspecified site, without tophus (tophi): Secondary | ICD-10-CM

## 2022-10-25 MED ORDER — PREDNISONE 20 MG PO TABS: 40.0000 mg | ORAL_TABLET | Freq: Every day | ORAL | 0 refills | Status: DC

## 2022-10-25 NOTE — Telephone Encounter (Signed)
Refill sent.

## 2022-10-25 NOTE — Telephone Encounter (Addendum)
Pt was advised the refill request was denied / he stated he needs this for his Gout flair up / please advise / pts flair up is in his right foot / pt needs thi for the weekend

## 2022-10-30 ENCOUNTER — Telehealth: Payer: Self-pay | Admitting: Family Medicine

## 2022-10-30 NOTE — Telephone Encounter (Unsigned)
Copied from [place] [phone]. Topic: General - Other >> Oct 30, 2022 12:27 PM Johnny [name] wrote: Reason for CRM: Medication Refill - Medication: torsemide [facility]) tablet 100 mg   [[id number]]  Has the patient contacted their pharmacy? Yes.   (Agent: If no, request that the patient contact the pharmacy for the refill. If patient does not wish to contact the pharmacy document the reason why and proceed with request.) (Agent: If yes, when and what did the pharmacy advise?)  Preferred Pharmacy (with phone number or street name): [place] ([place]), [place] - [address] [place]) [place] [address] Phone: [phone] Fax: [phone] Hours: Not open 24 hours   Has the patient been seen for an appointment in the last year OR does the patient have an upcoming appointment? Yes.    Agent: Please be advised that RX refills may take up to 3 business days. We ask that you follow-up with your pharmacy.

## 2022-10-31 NOTE — Telephone Encounter (Addendum)
Pt is calling to follow up on the medication refill for torsemide (DEMADEX) 100 MG tablet. Adviced pt he needs to reach out to Nephrologist per  Reason for Refusal Comment: Please request pt's Nephrologist  Refused By: Tresa Endo, RPH-CPP  Pt declined and stated that he always calls [place], and we help him take care of this. He stated that he has missed his medication for 2-3 days now and is requesting assistance from our office.  Please advise.

## 2022-10-31 NOTE — Telephone Encounter (Signed)
Requested medication (s) are due for refill today: routing for review  Requested medication (s) are on the active medication list: yes  Last refill:  09/25/22  Future visit scheduled: yes  Notes to clinic:  Unable to refill per protocol, last refill by another provider.  Routing for approval.     Requested Prescriptions  Pending Prescriptions Disp Refills   torsemide (DEMADEX) 100 MG tablet 30 tablet 0    Sig: Take 1 tablet (100 mg total) by mouth daily.     Cardiovascular:  Diuretics - Loop Failed - 10/30/2022  5:20 PM      Failed - Ca in normal range and within 180 days    Calcium  Date Value Ref Range Status  09/25/2022 8.5 (L) 8.9 - 10.3 mg/dL Final   Calcium, Ion  Date Value Ref Range Status  05/23/2022 1.18 1.15 - 1.40 mmol/L Final         Failed - Cr in normal range and within 180 days    Creat  Date Value Ref Range Status  12/07/2016 2.78 (H) 0.70 - 1.33 mg/dL Final    Comment:      For patients > or = 59 years of age: The upper reference limit for Creatinine is approximately 13% higher for people identified as African-American.      Creatinine, Ser  Date Value Ref Range Status  09/25/2022 9.50 (H) 0.61 - 1.24 mg/dL Final   Creatinine, Urine  Date Value Ref Range Status  02/22/2009 225.5 mg/dL Final         Failed - Cl in normal range and within 180 days    Chloride  Date Value Ref Range Status  09/25/2022 95 (L) 98 - 111 mmol/L Final         Passed - K in normal range and within 180 days    Potassium  Date Value Ref Range Status  09/25/2022 3.6 3.5 - 5.1 mmol/L Final         Passed - Na in normal range and within 180 days    Sodium  Date Value Ref Range Status  09/25/2022 137 135 - 145 mmol/L Final  11/08/2020 135 134 - 144 mmol/L Final         Passed - Mg Level in normal range and within 180 days    Magnesium  Date Value Ref Range Status  09/21/2022 1.9 1.7 - 2.4 mg/dL Final    Comment:    Performed at [facility], [address], [place], [place] [address]         Passed - Last BP in normal range    BP Readings from Last 1 Encounters:  09/25/22 119/77         Passed - Valid encounter within last 6 months    Recent Outpatient Visits           4 months ago Chronic kidney disease (CKD), stage V ([place])   [place], [place], MD   5 months ago Right foot pain   [place], Charlane Ferretti, MD   7 months ago Idiopathic chronic gout without tophus, unspecified site   [place], [name]   7 months ago Mixed simple and mucopurulent chronic bronchitis [facility])   [place] [name], NP   10 months ago Hospital discharge follow-up   [place],  MD       Future Appointments             In 1 week Thereasa Solo, Casimer Bilis [place]

## 2022-10-31 NOTE — Telephone Encounter (Signed)
Patient informed to contact his kidney doctor.

## 2022-10-31 NOTE — Telephone Encounter (Signed)
Dr. Margarita Rana, I am unable to approve this. A provider outside of my protocol last wrote for this. Are you able to approve? We let the patient know future refills will have to be approved by his Nephrologist.

## 2022-10-31 NOTE — Telephone Encounter (Signed)
I have never filled torsemide prescription for him.  I had filled furosemide for him in the past.  Please have him contact his nephrologist due to this huge dose of Torsemide in the setting of chronic kidney disease.  Thank you

## 2022-11-07 ENCOUNTER — Ambulatory Visit: Payer: Medicaid Other | Attending: Physician Assistant | Admitting: Physician Assistant

## 2022-11-07 ENCOUNTER — Encounter: Payer: Self-pay | Admitting: Physician Assistant

## 2022-11-07 VITALS — BP 141/89 | HR 76 | Wt 194.6 lb

## 2022-11-07 DIAGNOSIS — E7800 Pure hypercholesterolemia, unspecified: Secondary | ICD-10-CM

## 2022-11-07 DIAGNOSIS — N185 Chronic kidney disease, stage 5: Secondary | ICD-10-CM | POA: Diagnosis not present

## 2022-11-07 DIAGNOSIS — M1A00X0 Idiopathic chronic gout, unspecified site, without tophus (tophi): Secondary | ICD-10-CM | POA: Diagnosis not present

## 2022-11-07 DIAGNOSIS — I5032 Chronic diastolic (congestive) heart failure: Secondary | ICD-10-CM | POA: Diagnosis not present

## 2022-11-07 DIAGNOSIS — L0231 Cutaneous abscess of buttock: Secondary | ICD-10-CM

## 2022-11-07 DIAGNOSIS — E039 Hypothyroidism, unspecified: Secondary | ICD-10-CM

## 2022-11-07 DIAGNOSIS — Z09 Encounter for follow-up examination after completed treatment for conditions other than malignant neoplasm: Secondary | ICD-10-CM | POA: Diagnosis not present

## 2022-11-07 DIAGNOSIS — I10 Essential (primary) hypertension: Secondary | ICD-10-CM

## 2022-11-07 DIAGNOSIS — J432 Centrilobular emphysema: Secondary | ICD-10-CM | POA: Diagnosis not present

## 2022-11-07 MED ORDER — LEVOTHYROXINE SODIUM 100 MCG PO TABS: 100.0000 ug | ORAL_TABLET | Freq: Every day | ORAL | 6 refills | Status: DC

## 2022-11-07 MED ORDER — ATORVASTATIN CALCIUM 40 MG PO TABS: 40.0000 mg | ORAL_TABLET | Freq: Every day | ORAL | 6 refills | Status: DC

## 2022-11-07 MED ORDER — ALLOPURINOL 100 MG PO TABS: 100.0000 mg | ORAL_TABLET | Freq: Every day | ORAL | 0 refills | Status: DC

## 2022-11-07 MED ORDER — DULERA 100-5 MCG/ACT IN AERO: 2.0000 | INHALATION_SPRAY | Freq: Two times a day (BID) | RESPIRATORY_TRACT | 6 refills | Status: DC

## 2022-11-07 MED ORDER — TORSEMIDE 100 MG PO TABS: 100.0000 mg | ORAL_TABLET | Freq: Every day | ORAL | 0 refills | Status: DC

## 2022-11-07 MED ORDER — MUPIROCIN 2 % EX OINT: 1.0000 | TOPICAL_OINTMENT | Freq: Two times a day (BID) | CUTANEOUS | 0 refills | Status: DC

## 2022-11-07 MED ORDER — DOXYCYCLINE HYCLATE 100 MG PO TABS: 100.0000 mg | ORAL_TABLET | Freq: Two times a day (BID) | ORAL | 0 refills | Status: DC

## 2022-11-07 MED ORDER — LABETALOL HCL 300 MG PO TABS: 300.0000 mg | ORAL_TABLET | Freq: Two times a day (BID) | ORAL | 0 refills | Status: DC

## 2022-11-07 MED ORDER — ISOSORBIDE MONONITRATE ER 30 MG PO TB24: 30.0000 mg | ORAL_TABLET | Freq: Every day | ORAL | 2 refills | Status: DC

## 2022-11-07 MED ORDER — AMLODIPINE BESYLATE 10 MG PO TABS: 10.0000 mg | ORAL_TABLET | Freq: Every day | ORAL | 6 refills | Status: DC

## 2022-11-07 NOTE — Patient Instructions (Signed)
Skin Abscess  A skin abscess is an infected area of your skin that contains pus and other material. An abscess can happen in any part of your body. Some abscesses break open (rupture) on their own. Most continue to get worse unless they are treated. The infection can spread deeper into the body and into your blood, which can make you feel sick. A skin abscess is caused by germs that enter the skin through a cut or scrape. It can also be caused by blocked oil and sweat glands or infected hair follicles. This condition is usually treated by: Draining the pus. Taking antibiotic medicines. Placing a warm, wet washcloth over the abscess. Follow these instructions at home: Medicines  Take over-the-counter and prescription medicines only as told by your doctor. If you were prescribed an antibiotic medicine, take it as told by your doctor. Do not stop taking the antibiotic even if you start to feel better. Abscess care  If you have an abscess that has not drained, place a warm, clean, wet washcloth over the abscess several times a day. Do this as told by your doctor. Follow instructions from your doctor about how to take care of your abscess. Make sure you: Cover the abscess with a bandage (dressing). Change your bandage or gauze as told by your doctor. Wash your hands with soap and water before you change the bandage or gauze. If you cannot use soap and water, use hand sanitizer. Check your abscess every day for signs that the infection is getting worse. Check for: More redness, swelling, or pain. More fluid or blood. Warmth. More pus or a bad smell. General instructions To avoid spreading the infection: Do not share personal care items, towels, or hot tubs with others. Avoid making skin-to-skin contact with other people. Keep all follow-up visits as told by your doctor. This is important. Contact a doctor if: You have more redness, swelling, or pain around your abscess. You have more fluid  or blood coming from your abscess. Your abscess feels warm when you touch it. You have more pus or a bad smell coming from your abscess. Your muscles ache. You feel sick. Get help right away if: You have very bad (severe) pain. You see red streaks on your skin spreading away from the abscess. You see redness that spreads quickly. You have a fever or chills. Summary A skin abscess is an infected area of your skin that contains pus and other material. The abscess is caused by germs that enter the skin through a cut or scrape. It can also be caused by blocked oil and sweat glands or infected hair follicles. Follow your doctor's instructions on caring for your abscess, taking medicines, preventing infections, and keeping follow-up visits. This information is not intended to replace advice given to you by your health care provider. Make sure you discuss any questions you have with your health care provider. Document Revised: 01/25/2022 Document Reviewed: 07/31/2021 Elsevier Patient Education  2023 Elsevier Inc.  

## 2022-11-07 NOTE — Progress Notes (Signed)
Patient ID: Johnny [name], male   DOB: 05-Nov-1961, 62 y.o.   MRN: [id number]     Johnny [name], is a 62 y.o. male  QHU:[id number]  [id number]  DOB - April 13, 1961  Chief Complaint  Patient presents with   Hospitalization Follow-up       Subjective:   Johnny [name] is a 62 y.o. male here today for a follow up visit After hospitalization 11/16-11/21.  He is feeling fine other than a boil on his L buttocks.  No fever.  Needs RF of meds.  Has not been taking amlodipine.  He sees his kidney doctor regularly.    From discharge summary:  Patient has been placed on torsemide 100 mg daily and will need close follow up as outpatient, he is very close to initiation of renal replacement therapy. Left upper extremity fistula is functioning. Follow up renal function as outpatient per Nephrology.    Discharge Diagnoses: Principal Problem:   CKD (chronic kidney disease) stage 5, GFR less than 15 ml/min (HCC) Active Problems:   Acute on chronic diastolic CHF (congestive heart failure) (HCC)   Essential hypertension   Hyperlipidemia   Hypothyroidism   Gout   COPD (chronic obstructive pulmonary disease) (HCC)   OSA (obstructive sleep apnea)   Hospital Course: Mr. Hendricksen was admitted to the hospital with the working diagnosis of volume overload.    62 yo male with the past medical history of hypertension, hepatitis C, CKD stage V (AV fistula in place), COPD and heart failure who presented with dyspnea. Patient reported 2 weeks of worsening dyspnea, to the point where he is symptomatic with minimal efforts, positive orthopnea and lower extremity edema. Positive weight gain over last 6 months. On his initial physical examination is blood pressure was 143/87, HR 78, RR 16 and 02 saturation 100%, positive JVD, lungs with no wheezing, or rales, heart with S1 and S2 present and rhythmic, abdomen with no distention, positive lower extremity edema +++ pitting.    NA 138, K 4,2 CL 109 bicarbonate 20, glucose  113 bun 93 cr 8,28  BNP 1,655  Wbc 10.7 hgb 7.9 plt 187    Chest radiograph with mild cardiomegaly, symmetric bilateral interstitial infiltrates with fluid in the right fissure.    EKG 77 bpm, normal axis and normal intervals, sinus rhythm with no significant ST segment, negative T waves V4 to V6.    Patient was placed on high doses of furosemide for diuresis.    11/19 volume has been improving, but continue with significant volume overload. His BUN continue to rise.  Patient may need to start on renal replacement therapy on this hospitalization.  11/20 volume status continue to improve with diuresis but serum cr not yet stable and continue to have high BUN.      Assessment and Plan: * CKD (chronic kidney disease) stage 5, GFR less than 15 ml/min (HCC) Patient with progressive renal disease, serum cr has been stable at 9 level.    Patient was placed on aggressive diuresis with furosemide IV and received one dose of oral metolazone. Negative fluid balance was achieved -11,929 ml, with significant improvement in his symptoms.  Had IV albumin x2    At the time of his discharge his serum cr is 9.5 with K at 3,6 and serum bicarbonate at 25. BUN 126, anion gap is 17    Transitioned to oral torsemide 100 mg daily.  Functioning left upper extremity fistula, created in 0174    Metabolic bone disease, continue with  calcitriol.  Anemia of chronic renal disease continue with EPO.  For metabolic acidosis continue with oral sodium bicarbonate    Follow up renal function and electrolytes as outpatient.    Acute on chronic diastolic CHF (congestive heart failure) ([place]) Acute pulmonary edema due to progressive renal failure.   Echocardiogram with preserved LV systolic function with EF 55 to 46%, RV systolic function preserved, small pericardial effusion, no significant valvular disease.    IV furosemide high dose on admission.  11/17 one dose of metolazone  His volume continue to improve, at  the time of his discharge he will continue diuresis with oral torsemide.   Continue blood pressure control with amlodipine, labetalol, and isosorbide.    Essential hypertension Continue with isosorbide, amlodipine and labetalol Diuresis with torsemide    Hyperlipidemia Continue with statin therapy    Hypothyroidism Levothyroxine    Gout Positive acute gout flare on right foot   Patient will continue taking allopurinol and will add prednisone 20 mg for the next 3 days.  Follow up as outpatient.    COPD (chronic obstructive pulmonary disease) (HCC) No clinical signs of exacerbation    OSA (obstructive sleep apnea) Home Cpap.  No problems updated.  ALLERGIES: Allergies  Allergen Reactions   Naproxen Other (See Comments)    Unknown reaction    Shellfish Allergy Itching    PAST MEDICAL HISTORY: Past Medical History:  Diagnosis Date   Adenomatous colon polyp    tubular   CHF (congestive heart failure) (HCC)    Chronic kidney disease    COPD (chronic obstructive pulmonary disease) (HCC)    Diverticulosis    External otitis of right ear 11/10/2018   Hepatitis C    Hypertension    OSA (obstructive sleep apnea) 11/03/2015   Strep throat 01/2021    MEDICATIONS AT HOME: Prior to Admission medications   Medication Sig Start Date End Date Taking? Authorizing Provider  acetaminophen (TYLENOL) 325 MG tablet Take 2 tablets (650 mg total) by mouth every 6 (six) hours as needed for moderate pain or mild pain. 08/06/22  Yes Rising, Wells Guiles, PA-C  albuterol (VENTOLIN HFA) 108 (90 Base) MCG/ACT inhaler Inhale 2 puffs into the lungs every 6 (six) hours as needed for wheezing or shortness of breath. 12/01/21  Yes Ghimire, Dante Gang, MD  calcitRIOL (ROCALTROL) 0.5 MCG capsule Take 0.5 mcg by mouth daily. 05/03/22  Yes [provider]  doxycycline (VIBRA-TABS) 100 MG tablet Take 1 tablet (100 mg total) by mouth 2 (two) times daily. 11/07/22  Yes Malya Cirillo, Dionne Bucy, PA-C   ipratropium-albuterol (DUONEB) 0.5-2.5 (3) MG/3ML SOLN Inhale 3 mLs into the lungs every 6 (six) hours as needed. 08/28/22  Yes Charlott Rakes, MD  mupirocin ointment (BACTROBAN) 2 % Apply 1 Application topically 2 (two) times daily. 11/07/22  Yes Argentina Donovan, PA-C  sodium bicarbonate 650 MG tablet Take 1 tablet (650 mg total) by mouth 2 (two) times daily. 06/07/22  Yes Domenic Polite, MD  triamcinolone cream (KENALOG) 0.1 % APPLY  CREAM EXTERNALLY TWICE DAILY 08/28/22  Yes Charlott Rakes, MD  allopurinol (ZYLOPRIM) 100 MG tablet Take 1 tablet (100 mg total) by mouth daily. 11/07/22 12/07/22  Argentina Donovan, PA-C  amLODipine (NORVASC) 10 MG tablet Take 1 tablet (10 mg total) by mouth daily. 11/07/22   Argentina Donovan, PA-C  atorvastatin (LIPITOR) 40 MG tablet Take 1 tablet (40 mg total) by mouth daily. 11/07/22   Argentina Donovan, PA-C  isosorbide mononitrate (IMDUR) 30 MG 24 hr  tablet Take 1 tablet (30 mg total) by mouth daily. 11/07/22 02/05/23  Argentina Donovan, PA-C  labetalol (NORMODYNE) 300 MG tablet Take 1 tablet (300 mg total) by mouth 2 (two) times daily. 11/07/22   Argentina Donovan, PA-C  levothyroxine (SYNTHROID) 100 MCG tablet Take 1 tablet (100 mcg total) by mouth daily. 11/07/22   Argentina Donovan, PA-C  mometasone-formoterol (DULERA) 100-5 MCG/ACT AERO Inhale 2 puffs into the lungs 2 (two) times daily. 11/07/22   Argentina Donovan, PA-C  torsemide (DEMADEX) 100 MG tablet Take 1 tablet (100 mg total) by mouth daily. 11/07/22 12/07/22  Argentina Donovan, PA-C    ROS: Neg HEENT Neg resp Neg cardiac Neg GI Neg GU Neg MS Neg psych Neg neuro  Objective:   Vitals:   11/07/22 1436  BP: (!) 141/89  Pulse: 76  SpO2: 98%  Weight: 194 lb 9.6 oz (88.3 kg)   Exam General appearance : Awake, alert, not in any distress. Speech Clear. Not toxic looking HEENT: Atraumatic and Normocephalic Neck: Supple, no JVD. No cervical lymphadenopathy.  Chest: Good air entry bilaterally, CTAB.  No  rales/rhonchi/wheezing CVS: S1 S2 regular, no murmurs.  L buttocks-inferior mid aspect-1 cm tender and fluctuant abscess   No induration Extremities: B/L Lower Ext shows no edema, both legs are warm to touch Neurology: Awake alert, and oriented X 3, CN II-XII intact, Non focal Skin: No Rash  Data Review Lab Results  Component Value Date   HGBA1C 5.3 05/24/2022   HGBA1C  02/21/2009    5.3 (NOTE) The ADA recommends the following therapeutic goal for glycemic control related to Hgb A1c measurement: Goal of therapy: <6.5 Hgb A1c  Reference: American Diabetes Association: Clinical Practice Recommendations 2010, Diabetes Care, 2010, 33: (Suppl  1).    Assessment & Plan   1. Essential hypertension Resume amlodipine and continue labetelol - labetalol (NORMODYNE) 300 MG tablet; Take 1 tablet (300 mg total) by mouth 2 (two) times daily.  Dispense: 60 tablet; Refill: 0 - amLODipine (NORVASC) 10 MG tablet; Take 1 tablet (10 mg total) by mouth daily.  Dispense: 30 tablet; Refill: 6  2. Idiopathic chronic gout without tophus, unspecified site - allopurinol (ZYLOPRIM) 100 MG tablet; Take 1 tablet (100 mg total) by mouth daily.  Dispense: 30 tablet; Refill: 0  3. Pure hypercholesterolemia - atorvastatin (LIPITOR) 40 MG tablet; Take 1 tablet (40 mg total) by mouth daily.  Dispense: 30 tablet; Refill: 6  4. Acquired hypothyroidism - Thyroid Panel With TSH - levothyroxine (SYNTHROID) 100 MCG tablet; Take 1 tablet (100 mcg total) by mouth daily.  Dispense: 30 tablet; Refill: 6  5. Centrilobular emphysema ([place]) - mometasone-formoterol (DULERA) 100-5 MCG/ACT AERO; Inhale 2 puffs into the lungs 2 (two) times daily.  Dispense: 13 g; Refill: 6  6. CKD (chronic kidney disease) stage 5, GFR less than 15 ml/min (HCC) Followed by nephrology.  Fistula in place  7. Chronic diastolic congestive heart failure (HCC) - torsemide (DEMADEX) 100 MG tablet; Take 1 tablet (100 mg total) by mouth daily.  Dispense:  30 tablet; Refill: 0 - isosorbide mononitrate (IMDUR) 30 MG 24 hr tablet; Take 1 tablet (30 mg total) by mouth daily.  Dispense: 30 tablet; Refill: 2  8. Hospital discharge follow-up  9. Abscess of buttock - mupirocin ointment (BACTROBAN) 2 %; Apply 1 Application topically 2 (two) times daily.  Dispense: 22 g; Refill: 0 - doxycycline (VIBRA-TABS) 100 MG tablet; Take 1 tablet (100 mg total) by mouth 2 (two) times daily.  Dispense: 20 tablet; Refill: 0    Return in about 3 months (around 02/06/2023) for PCP for chronic conditions.  The patient was given clear instructions to go to ER or return to medical center if symptoms don't improve, worsen or new problems develop. The patient verbalized understanding. The patient was told to call to get lab results if they haven't heard anything in the next week.      Freeman Caldron, PA-C [facility] [place], [place]   11/07/2022, 3:09 PM

## 2022-11-08 LAB — THYROID PANEL WITH TSH
Free Thyroxine Index: 2.5 (ref 1.2–4.9)
T3 Uptake Ratio: 32 % (ref 24–39)
T4, Total: 7.9 ug/dL (ref 4.5–12.0)
TSH: 1.3 u[IU]/mL (ref 0.450–4.500)

## 2022-11-23 ENCOUNTER — Other Ambulatory Visit: Payer: Self-pay | Admitting: Family Medicine

## 2022-11-23 DIAGNOSIS — M1A30X0 Chronic gout due to renal impairment, unspecified site, without tophus (tophi): Secondary | ICD-10-CM

## 2022-12-06 DIAGNOSIS — N2581 Secondary hyperparathyroidism of renal origin: Secondary | ICD-10-CM | POA: Diagnosis not present

## 2022-12-06 DIAGNOSIS — I770 Arteriovenous fistula, acquired: Secondary | ICD-10-CM | POA: Diagnosis not present

## 2022-12-06 DIAGNOSIS — E872 Acidosis, unspecified: Secondary | ICD-10-CM | POA: Diagnosis not present

## 2022-12-06 DIAGNOSIS — D631 Anemia in chronic kidney disease: Secondary | ICD-10-CM | POA: Diagnosis not present

## 2022-12-06 DIAGNOSIS — I5030 Unspecified diastolic (congestive) heart failure: Secondary | ICD-10-CM | POA: Diagnosis not present

## 2022-12-06 DIAGNOSIS — N185 Chronic kidney disease, stage 5: Secondary | ICD-10-CM | POA: Diagnosis not present

## 2022-12-06 DIAGNOSIS — I120 Hypertensive chronic kidney disease with stage 5 chronic kidney disease or end stage renal disease: Secondary | ICD-10-CM | POA: Diagnosis not present

## 2022-12-06 DIAGNOSIS — E785 Hyperlipidemia, unspecified: Secondary | ICD-10-CM | POA: Diagnosis not present

## 2022-12-11 ENCOUNTER — Ambulatory Visit: Payer: Self-pay | Admitting: *Deleted

## 2022-12-11 MED ORDER — COLCHICINE 0.6 MG PO TABS: ORAL_TABLET | ORAL | 1 refills | Status: DC

## 2022-12-11 MED ORDER — PREDNISONE 20 MG PO TABS: 20.0000 mg | ORAL_TABLET | Freq: Every day | ORAL | 0 refills | Status: DC

## 2022-12-11 NOTE — Addendum Note (Signed)
Addended by: Charlott Rakes on: 12/11/2022 06:18 PM   Modules accepted: Orders

## 2022-12-11 NOTE — Telephone Encounter (Signed)
Pt having gout flare up and requests Rx refill   Pt reports that he is having a gout flare up and he needs the Rx to be refilled. Pt not sure of the name of the medication but requests that the Rx refill be sent in to the pharmacy asap. Cb# [phone]     Chief Complaint: Gout Symptoms: Gout type pain right foot, big toe. Frequency: today Pertinent Negatives: Patient denies   Disposition: '[]'$ ED /'[]'$ Urgent Care (no appt availability in office) / '[]'$ Appointment(In office/virtual)/ '[]'$  [place] [facility]/ '[]'$ Home Care/ '[]'$ Refused Recommended Disposition /'[]'$ [place] [facility]/ '[x]'$  Follow-up with PCP Additional Notes:"Same place I get gout in, want to get med before it gets worse."  Requesting refill of Colchicine, appears med has expired, need prescription. Not on current med profile. Please advise.  Reason for Disposition  Pain in the big toe joint  Answer Assessment - Initial Assessment Questions 1. ONSET: "When did the pain start?"      This AM 2. LOCATION: "Where is the pain located?"   (e.g., around nail, entire toe, at foot joint)      Great toe of right foot 3. PAIN: "How bad is the pain?"    (Scale 1-10; or mild, moderate, severe)   -  MILD (1-3): doesn't interfere with normal activities    -  MODERATE (4-7): interferes with normal activities (e.g., work or school) or awakens from sleep, limping    -  SEVERE (8-10): excruciating pain, unable to do any normal activities, unable to walk     Fluctuates. :Like electric pain." 4. APPEARANCE: "What does the toe look like?" (e.g., redness, swelling, bruising, pallor)     Gout 5. CAUSE: "What do you think is causing the toe pain?"     Gout 6. OTHER SYMPTOMS: "Do you have any other symptoms?" (e.g., leg pain, rash, fever, numbness)     Red and warm  Protocols used: Toe Pain-A-AH

## 2022-12-11 NOTE — Telephone Encounter (Signed)
Prescription has been sent to [facility] in [place].

## 2022-12-11 NOTE — Telephone Encounter (Signed)
Routing to PCP for review.

## 2022-12-12 NOTE — Telephone Encounter (Signed)
Pt has been informed of medication being sent.

## 2022-12-14 ENCOUNTER — Ambulatory Visit: Payer: Self-pay

## 2022-12-14 NOTE — Telephone Encounter (Signed)
Voice mailbox is full, unable to leave a message

## 2022-12-17 ENCOUNTER — Telehealth: Payer: Self-pay | Admitting: Emergency Medicine

## 2022-12-17 NOTE — Telephone Encounter (Signed)
Patient has been scheduled for a virtual visit with Dr.Newlin on 12/19/2022 at 130 to discuss the need for the letter.

## 2022-12-17 NOTE — Telephone Encounter (Signed)
Copied from [place] [id number]. Topic: General - Other >> Dec 17, 2022  9:54 AM Everette C wrote: Reason for CRM: The patient has called to request a list of their diagnoses and symptoms and their need for disability assistance with their classes   Please contact the patient further when possible

## 2022-12-19 ENCOUNTER — Telehealth (HOSPITAL_BASED_OUTPATIENT_CLINIC_OR_DEPARTMENT_OTHER): Payer: Medicaid Other | Admitting: Family Medicine

## 2022-12-19 ENCOUNTER — Encounter: Payer: Self-pay | Admitting: Family Medicine

## 2022-12-19 DIAGNOSIS — R41840 Attention and concentration deficit: Secondary | ICD-10-CM

## 2022-12-19 DIAGNOSIS — N185 Chronic kidney disease, stage 5: Secondary | ICD-10-CM

## 2022-12-19 DIAGNOSIS — Z1211 Encounter for screening for malignant neoplasm of colon: Secondary | ICD-10-CM | POA: Diagnosis not present

## 2022-12-19 DIAGNOSIS — I10 Essential (primary) hypertension: Secondary | ICD-10-CM | POA: Diagnosis not present

## 2022-12-19 NOTE — Progress Notes (Signed)
Virtual Visit via Video Note  I connected with Johnny [name], on 12/19/2022 at 1:33 PM by video enabled telemedicine device and verified that I am speaking with the correct person using two identifiers.   Consent: I discussed the limitations, risks, security and privacy concerns of performing an evaluation and management service by telemedicine and the availability of in person appointments. I also discussed with the patient that there may be a patient responsible charge related to this service. The patient expressed understanding and agreed to proceed.   Location of Patient: Home  Location of Provider: Clinic   Persons participating in Telemedicine visit: OTHNIEL URCIUOLI Dr. Margarita Rana     History of Present Illness: Johnny [name] is a 62 y.o. year old male  with  COPD, Gout, stage V chronic kidney disease, hypertension, sleep apnea, HFrEF (EF 60 -65% from 11/2021).     He sates he is currently on disability based on his CKD.  Requests a letter indicating his medical conditions and how they might affect his learning as he is currently enrolled in school.  He thinks he might have ADHD even though he states he has never been diagnosed wih it. States he has a low attention span and has been this way since he was a child.  He would like to undergo workup for this to establish a diagnosis.  He has a kidney donor and is being worked up by [facility] for renal transplant and will require colonoscopy.  He was informed he is anemic and is being scheduled for arthroplasty replacement therapy.  BP was 160/90 and he took an extra pill of his labetalol as he was having headaches.  Review of his blood pressures reveal slightly elevated blood pressure at his last visit a month ago but prior to that blood pressures have been normal. Past Medical History:  Diagnosis Date   Adenomatous colon polyp    tubular   CHF (congestive heart failure) (HCC)    Chronic kidney disease    COPD (chronic  obstructive pulmonary disease) (HCC)    Diverticulosis    External otitis of right ear 11/10/2018   Hepatitis C    Hypertension    OSA (obstructive sleep apnea) 11/03/2015   Strep throat 01/2021   Allergies  Allergen Reactions   Naproxen Other (See Comments)    Unknown reaction    Shellfish Allergy Itching    Current Outpatient Medications on File Prior to Visit  Medication Sig Dispense Refill   acetaminophen (TYLENOL) 325 MG tablet Take 2 tablets (650 mg total) by mouth every 6 (six) hours as needed for moderate pain or mild pain. 30 tablet 1   albuterol (VENTOLIN HFA) 108 (90 Base) MCG/ACT inhaler Inhale 2 puffs into the lungs every 6 (six) hours as needed for wheezing or shortness of breath. 18 g 2   allopurinol (ZYLOPRIM) 100 MG tablet Take 1 tablet (100 mg total) by mouth daily. 30 tablet 0   amLODipine (NORVASC) 10 MG tablet Take 1 tablet (10 mg total) by mouth daily. 30 tablet 6   atorvastatin (LIPITOR) 40 MG tablet Take 1 tablet (40 mg total) by mouth daily. 30 tablet 6   calcitRIOL (ROCALTROL) 0.5 MCG capsule Take 0.5 mcg by mouth daily.     colchicine 0.6 MG tablet Take 2 tabs (1.2 mg) at the onset of a gout flare, may repeat 1 tab (0.6 mg) in 1 hour if symptoms persist 30 tablet 1   doxycycline (VIBRA-TABS) 100 MG tablet  Take 1 tablet (100 mg total) by mouth 2 (two) times daily. 20 tablet 0   ipratropium-albuterol (DUONEB) 0.5-2.5 (3) MG/3ML SOLN Inhale 3 mLs into the lungs every 6 (six) hours as needed. 90 mL 0   isosorbide mononitrate (IMDUR) 30 MG 24 hr tablet Take 1 tablet (30 mg total) by mouth daily. 30 tablet 2   labetalol (NORMODYNE) 300 MG tablet Take 1 tablet (300 mg total) by mouth 2 (two) times daily. 60 tablet 0   levothyroxine (SYNTHROID) 100 MCG tablet Take 1 tablet (100 mcg total) by mouth daily. 30 tablet 6   mometasone-formoterol (DULERA) 100-5 MCG/ACT AERO Inhale 2 puffs into the lungs 2 (two) times daily. 13 g 6   mupirocin ointment (BACTROBAN) 2 % Apply 1  Application topically 2 (two) times daily. 22 g 0   predniSONE (DELTASONE) 20 MG tablet Take 1 tablet (20 mg total) by mouth daily with breakfast. 5 tablet 0   sodium bicarbonate 650 MG tablet Take 1 tablet (650 mg total) by mouth 2 (two) times daily. 60 tablet 0   torsemide (DEMADEX) 100 MG tablet Take 1 tablet (100 mg total) by mouth daily. 30 tablet 0   triamcinolone cream (KENALOG) 0.1 % APPLY  CREAM EXTERNALLY TWICE DAILY 45 g 0   No current facility-administered medications on file prior to visit.    ROS: See HPI  Observations/Objective: Awake, alert, oriented x3 Not in acute distress Normal mood      11/07/2022    2:36 PM 09/25/2022   10:48 AM 09/25/2022    8:22 AM  Vitals with BMI  Weight 194 lbs 10 oz    Systolic [id number]   Diastolic 89 77   Pulse 76 84 78        Latest Ref Rng & Units 09/25/2022    6:09 AM 09/24/2022   12:40 AM 09/23/2022   12:37 AM  CMP  Glucose 70 - 99 mg/dL 101  102  114   BUN 6 - 20 mg/dL 126  128  126   Creatinine 0.61 - 1.24 mg/dL 9.50  9.35  9.00   Sodium 135 - 145 mmol/L 137  136  138   Potassium 3.5 - 5.1 mmol/L 3.6  4.0  4.0   Chloride 98 - 111 mmol/L 95  99  103   CO2 22 - 32 mmol/L 25  22  19   $ Calcium 8.9 - 10.3 mg/dL 8.5  8.5  8.1     Lipid Panel     Component Value Date/Time   CHOL 123 05/24/2022 0341   CHOL 214 (H) 11/10/2018 1104   TRIG 44 05/24/2022 0341   HDL 65 05/24/2022 0341   HDL 96 11/10/2018 1104   CHOLHDL 1.9 05/24/2022 0341   VLDL 9 05/24/2022 0341   LDLCALC 49 05/24/2022 0341   LDLCALC 102 (H) 11/10/2018 1104   LABVLDL 16 11/10/2018 1104    Lab Results  Component Value Date   HGBA1C 5.3 05/24/2022    CBC    Component Value Date/Time   WBC 10.5 09/21/2022 0347   RBC 3.45 (L) 09/21/2022 0347   HGB 8.7 (L) 09/21/2022 0347   HGB 14.2 11/08/2020 1654   HCT 25.3 (L) 09/21/2022 0347   HCT 41.7 11/08/2020 1654   PLT 201 09/21/2022 0347   PLT 183 11/08/2020 1654   MCV 73.3 (L) 09/21/2022 0347    MCV 77 (L) 11/08/2020 1654   MCH 25.2 (L) 09/21/2022 0347   MCHC 34.4 09/21/2022 0347  RDW 18.4 (H) 09/21/2022 0347   RDW 15.8 (H) 11/08/2020 1654   LYMPHSABS 0.9 09/21/2022 0347   LYMPHSABS 2.5 11/08/2020 1654   MONOABS 0.3 09/21/2022 0347   EOSABS 0.1 09/21/2022 0347   EOSABS 0.4 11/08/2020 1654   BASOSABS 0.0 09/21/2022 0347   BASOSABS 0.1 11/08/2020 1654     Assessment and Plan: 1. Attention deficit He would like to be evaluated for ADHD hence referral to psych has been placed. - Ambulatory referral to Psychiatry  2. CKD (chronic kidney disease) stage 5, GFR less than 15 ml/min (HCC) Currently being worked up for renal transplant He is on disability due to his CKD I have provided him a letter as requested  3. Screening for colon cancer - Ambulatory referral to Gastroenterology  4.  Essential hypertension He reports a single elevated blood pressure at home We have discussed appropriate blood pressure measurements and the fact that he needs to repeat blood pressure after single elevation He will continue to keep a blood pressure log and I have advised him to schedule an appointment so this can be reviewed.  If blood pressures remain elevated at home I will go ahead and adjust his antihypertensive regimen.   Follow Up Instructions: Call the clinic to schedule an appointment to follow-up on elevated blood pressure   I discussed the assessment and treatment plan with the patient. The patient was provided an opportunity to ask questions and all were answered. The patient agreed with the plan and demonstrated an understanding of the instructions.   The patient was advised to call back or seek an in-person evaluation if the symptoms worsen or if the condition fails to improve as anticipated.     I provided 17 minutes total of Telehealth time during this encounter including median intraservice time, reviewing previous notes, investigations, ordering medications, medical  decision making, coordinating care and patient verbalized understanding at the end of the visit.     Charlott Rakes, MD, FAAFP. [facility] [place], [place]   12/19/2022, 1:33 PM

## 2022-12-19 NOTE — Patient Instructions (Signed)
Managing Your Hypertension Hypertension, also called high blood pressure, is when the force of the blood pressing against the walls of the arteries is too strong. Arteries are blood vessels that carry blood from your heart throughout your body. Hypertension forces the heart to work harder to pump blood and may cause the arteries to become narrow or stiff. Understanding blood pressure readings A blood pressure reading includes a higher number over a lower number: The first, or top, number is called the systolic pressure. It is a measure of the pressure in your arteries as your heart beats. The second, or bottom number, is called the diastolic pressure. It is a measure of the pressure in your arteries as the heart relaxes. For most people, a normal blood pressure is below 120/80. Your personal target blood pressure may vary depending on your medical conditions, your age, and other factors. Blood pressure is classified into four stages. Based on your blood pressure reading, your health care provider may use the following stages to determine what type of treatment you need, if any. Systolic pressure and diastolic pressure are measured in a unit called millimeters of mercury (mmHg). Normal Systolic pressure: below 120. Diastolic pressure: below 80. Elevated Systolic pressure: 120-129. Diastolic pressure: below 80. Hypertension stage 1 Systolic pressure: 130-139. Diastolic pressure: 80-89. Hypertension stage 2 Systolic pressure: 140 or above. Diastolic pressure: 90 or above. How can this condition affect me? Managing your hypertension is very important. Over time, hypertension can damage the arteries and decrease blood flow to parts of the body, including the brain, heart, and kidneys. Having untreated or uncontrolled hypertension can lead to: A heart attack. A stroke. A weakened blood vessel (aneurysm). Heart failure. Kidney damage. Eye damage. Memory and concentration problems. Vascular  dementia. What actions can I take to manage this condition? Hypertension can be managed by making lifestyle changes and possibly by taking medicines. Your health care provider will help you make a plan to bring your blood pressure within a normal range. You may be referred for counseling on a healthy diet and physical activity. Nutrition  Eat a diet that is high in fiber and potassium, and low in salt (sodium), added sugar, and fat. An example eating plan is called the DASH diet. DASH stands for Dietary Approaches to Stop Hypertension. To eat this way: Eat plenty of fresh fruits and vegetables. Try to fill one-half of your plate at each meal with fruits and vegetables. Eat whole grains, such as whole-wheat pasta, brown rice, or whole-grain bread. Fill about one-fourth of your plate with whole grains. Eat low-fat dairy products. Avoid fatty cuts of meat, processed or cured meats, and poultry with skin. Fill about one-fourth of your plate with lean proteins such as fish, chicken without skin, beans, eggs, and tofu. Avoid pre-made and processed foods. These tend to be higher in sodium, added sugar, and fat. Reduce your daily sodium intake. Many people with hypertension should eat less than 1,500 mg of sodium a day. Lifestyle  Work with your health care provider to maintain a healthy body weight or to lose weight. Ask what an ideal weight is for you. Get at least 30 minutes of exercise that causes your heart to beat faster (aerobic exercise) most days of the week. Activities may include walking, swimming, or biking. Include exercise to strengthen your muscles (resistance exercise), such as weight lifting, as part of your weekly exercise routine. Try to do these types of exercises for 30 minutes at least 3 days a week. Do   not use any products that contain nicotine or tobacco. These products include cigarettes, chewing tobacco, and vaping devices, such as e-cigarettes. If you need help quitting, ask your  health care provider. Control any long-term (chronic) conditions you have, such as high cholesterol or diabetes. Identify your sources of stress and find ways to manage stress. This may include meditation, deep breathing, or making time for fun activities. Alcohol use Do not drink alcohol if: Your health care provider tells you not to drink. You are pregnant, may be pregnant, or are planning to become pregnant. If you drink alcohol: Limit how much you have to: 0-1 drink a day for women. 0-2 drinks a day for men. Know how much alcohol is in your drink. In the [place] one drink equals one 12 oz bottle of beer (355 mL), one 5 oz glass of wine (148 mL), or one 1 oz glass of hard liquor (44 mL). Medicines Your health care provider may prescribe medicine if lifestyle changes are not enough to get your blood pressure under control and if: Your systolic blood pressure is 130 or higher. Your diastolic blood pressure is 80 or higher. Take medicines only as told by your health care provider. Follow the directions carefully. Blood pressure medicines must be taken as told by your health care provider. The medicine does not work as well when you skip doses. Skipping doses also puts you at risk for problems. Monitoring Before you monitor your blood pressure: Do not smoke, drink caffeinated beverages, or exercise within 30 minutes before taking a measurement. Use the bathroom and empty your bladder (urinate). Sit quietly for at least 5 minutes before taking measurements. Monitor your blood pressure at home as told by your health care provider. To do this: Sit with your back straight and supported. Place your feet flat on the floor. Do not cross your legs. Support your arm on a flat surface, such as a table. Make sure your upper arm is at heart level. Each time you measure, take two or three readings one minute apart and record the results. You may also need to have your blood pressure checked regularly by  your health care provider. General information Talk with your health care provider about your diet, exercise habits, and other lifestyle factors that may be contributing to hypertension. Review all the medicines you take with your health care provider because there may be side effects or interactions. Keep all follow-up visits. Your health care provider can help you create and adjust your plan for managing your high blood pressure. Where to find more information National Heart, Lung, and Blood Institute: www.nhlbi.nih.gov American Heart Association: www.heart.org Contact a health care provider if: You think you are having a reaction to medicines you have taken. You have repeated (recurrent) headaches. You feel dizzy. You have swelling in your ankles. You have trouble with your vision. Get help right away if: You develop a severe headache or confusion. You have unusual weakness or numbness, or you feel faint. You have severe pain in your chest or abdomen. You vomit repeatedly. You have trouble breathing. These symptoms may be an emergency. Get help right away. Call 911. Do not wait to see if the symptoms will go away. Do not drive yourself to the hospital. Summary Hypertension is when the force of blood pumping through your arteries is too strong. If this condition is not controlled, it may put you at risk for serious complications. Your personal target blood pressure may vary depending on your medical conditions,   your age, and other factors. For most people, a normal blood pressure is less than 120/80. Hypertension is managed by lifestyle changes, medicines, or both. Lifestyle changes to help manage hypertension include losing weight, eating a healthy, low-sodium diet, exercising more, stopping smoking, and limiting alcohol. This information is not intended to replace advice given to you by your health care provider. Make sure you discuss any questions you have with your health care  provider. Document Revised: 07/06/2021 Document Reviewed: 07/06/2021 Elsevier Patient Education  2023 Elsevier Inc.  

## 2022-12-26 ENCOUNTER — Other Ambulatory Visit (HOSPITAL_COMMUNITY): Payer: Self-pay

## 2022-12-27 ENCOUNTER — Encounter (HOSPITAL_COMMUNITY)
Admission: RE | Admit: 2022-12-27 | Discharge: 2022-12-27 | Disposition: A | Discharge status: Home / Self Care | Payer: Medicaid Other | Source: Ambulatory Visit | Attending: Internal Medicine | Admitting: Internal Medicine

## 2022-12-27 DIAGNOSIS — N189 Chronic kidney disease, unspecified: Secondary | ICD-10-CM | POA: Insufficient documentation

## 2022-12-27 DIAGNOSIS — D631 Anemia in chronic kidney disease: Secondary | ICD-10-CM | POA: Diagnosis present

## 2022-12-27 MED ORDER — SODIUM CHLORIDE 0.9 % IV SOLN
510.0000 mg | INTRAVENOUS | Status: DC
  Administered 2022-12-27: 510 mg via INTRAVENOUS
  Filled 2022-12-27: qty 510

## 2023-01-03 ENCOUNTER — Encounter (HOSPITAL_COMMUNITY)
Admission: RE | Admit: 2023-01-03 | Discharge: 2023-01-03 | Disposition: A | Discharge status: Home / Self Care | Payer: Medicaid Other | Source: Ambulatory Visit | Attending: Internal Medicine | Admitting: Internal Medicine

## 2023-01-03 DIAGNOSIS — N189 Chronic kidney disease, unspecified: Secondary | ICD-10-CM | POA: Diagnosis not present

## 2023-01-03 MED ORDER — SODIUM CHLORIDE 0.9 % IV SOLN
510.0000 mg | INTRAVENOUS | Status: DC
  Administered 2023-01-03: 510 mg via INTRAVENOUS
  Filled 2023-01-03: qty 510

## 2023-01-09 DIAGNOSIS — I120 Hypertensive chronic kidney disease with stage 5 chronic kidney disease or end stage renal disease: Secondary | ICD-10-CM | POA: Diagnosis not present

## 2023-01-09 DIAGNOSIS — E872 Acidosis, unspecified: Secondary | ICD-10-CM | POA: Diagnosis not present

## 2023-01-09 DIAGNOSIS — N185 Chronic kidney disease, stage 5: Secondary | ICD-10-CM | POA: Diagnosis not present

## 2023-01-09 DIAGNOSIS — E785 Hyperlipidemia, unspecified: Secondary | ICD-10-CM | POA: Diagnosis not present

## 2023-01-09 DIAGNOSIS — I770 Arteriovenous fistula, acquired: Secondary | ICD-10-CM | POA: Diagnosis not present

## 2023-01-09 DIAGNOSIS — D631 Anemia in chronic kidney disease: Secondary | ICD-10-CM | POA: Diagnosis not present

## 2023-01-09 DIAGNOSIS — N2581 Secondary hyperparathyroidism of renal origin: Secondary | ICD-10-CM | POA: Diagnosis not present

## 2023-01-09 DIAGNOSIS — I5030 Unspecified diastolic (congestive) heart failure: Secondary | ICD-10-CM | POA: Diagnosis not present

## 2023-01-10 LAB — LAB REPORT - SCANNED: EGFR: 5

## 2023-01-15 ENCOUNTER — Telehealth: Payer: Self-pay | Admitting: Emergency Medicine

## 2023-01-15 NOTE — Telephone Encounter (Signed)
Copied from [place] [phone]. Topic: General - Inquiry >> Jan 15, 2023  8:25 AM Penni Bombard wrote: Reason for CRM: pt called regarding a letter that was written to his school.  The letter is stating his learning situation.  He said he picked up a copy yesterday but the school needs the letter sent directly to the from the office.  Issue of urgency  Please let pt know when this is sent over,  [employer] Dr. Ronnie Doss [email or web address]  [phone]  [phone]

## 2023-01-17 NOTE — Telephone Encounter (Signed)
Letter has been sent over to email that was provided.

## 2023-01-18 NOTE — Telephone Encounter (Signed)
Patient called in about letter for school, I gave him the info that it was sent by email.[name] Kitchen He wants a copy of that in my chart.

## 2023-01-22 ENCOUNTER — Other Ambulatory Visit: Payer: Self-pay | Admitting: Family Medicine

## 2023-01-22 DIAGNOSIS — L2089 Other atopic dermatitis: Secondary | ICD-10-CM

## 2023-01-23 ENCOUNTER — Ambulatory Visit (HOSPITAL_COMMUNITY)
Admission: RE | Admit: 2023-01-23 | Discharge: 2023-01-23 | Disposition: A | Discharge status: Home / Self Care | Payer: Medicaid Other | Source: Ambulatory Visit | Attending: Internal Medicine | Admitting: Internal Medicine

## 2023-01-23 VITALS — BP 122/71 | HR 74 | Temp 97.6°F | Resp 17

## 2023-01-23 DIAGNOSIS — N185 Chronic kidney disease, stage 5: Secondary | ICD-10-CM | POA: Insufficient documentation

## 2023-01-23 LAB — POCT HEMOGLOBIN-HEMACUE: Hemoglobin: 8.2 g/dL — ABNORMAL LOW (ref 13.0–17.0)

## 2023-01-23 MED ORDER — EPOETIN ALFA-EPBX 10000 UNIT/ML IJ SOLN
INTRAMUSCULAR | Status: AC
  Filled 2023-01-23: qty 1

## 2023-01-23 MED ORDER — EPOETIN ALFA-EPBX 10000 UNIT/ML IJ SOLN
10000.0000 [IU] | INTRAMUSCULAR | Status: DC
  Administered 2023-01-23: 10000 [IU] via SUBCUTANEOUS

## 2023-01-24 ENCOUNTER — Other Ambulatory Visit: Payer: Self-pay | Admitting: Family Medicine

## 2023-01-30 ENCOUNTER — Telehealth: Payer: Self-pay | Admitting: Family Medicine

## 2023-01-30 NOTE — Telephone Encounter (Signed)
Copied from [place] [phone]. Topic: General - Other >> Jan 30, 2023 12:32 PM Dominique A wrote: Reason for CRM: Pt is calling to see if he can be evaluated for ADHD. Please call pt back.

## 2023-01-30 NOTE — Telephone Encounter (Signed)
Copied from [place] [phone]. Topic: General - Other >> Jan 30, 2023 12:34 PM Dominique A wrote: Reason for CRM: Pt is wanting to know if his Vitamin D could be checked and want to know if he need to start taking Vitamin D. Pt has an upcoming appt on 02/06/23 and is wanting to know if his Vitamin D could be checked at this appt.  Please advise.

## 2023-01-30 NOTE — Telephone Encounter (Signed)
Noted  

## 2023-01-30 NOTE — Telephone Encounter (Signed)
Can be discussed at upcoming visit.

## 2023-02-05 DIAGNOSIS — Z113 Encounter for screening for infections with a predominantly sexual mode of transmission: Secondary | ICD-10-CM | POA: Diagnosis not present

## 2023-02-05 DIAGNOSIS — Z114 Encounter for screening for human immunodeficiency virus [HIV]: Secondary | ICD-10-CM | POA: Diagnosis not present

## 2023-02-06 ENCOUNTER — Encounter (HOSPITAL_COMMUNITY)
Admission: RE | Admit: 2023-02-06 | Discharge: 2023-02-06 | Disposition: A | Discharge status: Home / Self Care | Payer: Medicaid Other | Source: Ambulatory Visit | Attending: Internal Medicine | Admitting: Internal Medicine

## 2023-02-06 ENCOUNTER — Ambulatory Visit: Payer: Medicaid Other | Attending: Family Medicine | Admitting: Family Medicine

## 2023-02-06 ENCOUNTER — Encounter: Payer: Self-pay | Admitting: Family Medicine

## 2023-02-06 VITALS — BP 125/73 | HR 70 | Ht 73.0 in | Wt 189.4 lb

## 2023-02-06 VITALS — BP 129/75 | HR 60 | Temp 97.5°F | Resp 17

## 2023-02-06 DIAGNOSIS — J432 Centrilobular emphysema: Secondary | ICD-10-CM | POA: Diagnosis not present

## 2023-02-06 DIAGNOSIS — L2089 Other atopic dermatitis: Secondary | ICD-10-CM | POA: Diagnosis not present

## 2023-02-06 DIAGNOSIS — L299 Pruritus, unspecified: Secondary | ICD-10-CM | POA: Diagnosis not present

## 2023-02-06 DIAGNOSIS — E039 Hypothyroidism, unspecified: Secondary | ICD-10-CM | POA: Diagnosis not present

## 2023-02-06 DIAGNOSIS — M1A00X0 Idiopathic chronic gout, unspecified site, without tophus (tophi): Secondary | ICD-10-CM | POA: Diagnosis not present

## 2023-02-06 DIAGNOSIS — I10 Essential (primary) hypertension: Secondary | ICD-10-CM

## 2023-02-06 DIAGNOSIS — N185 Chronic kidney disease, stage 5: Secondary | ICD-10-CM

## 2023-02-06 DIAGNOSIS — Z131 Encounter for screening for diabetes mellitus: Secondary | ICD-10-CM | POA: Diagnosis not present

## 2023-02-06 LAB — RENAL FUNCTION PANEL
Albumin: 3.4 g/dL — ABNORMAL LOW (ref 3.5–5.0)
Anion gap: 19 — ABNORMAL HIGH (ref 5–15)
BUN: 156 mg/dL — ABNORMAL HIGH (ref 6–20)
CO2: 18 mmol/L — ABNORMAL LOW (ref 22–32)
Calcium: 8.6 mg/dL — ABNORMAL LOW (ref 8.9–10.3)
Chloride: 98 mmol/L (ref 98–111)
Creatinine, Ser: 10.12 mg/dL — ABNORMAL HIGH (ref 0.61–1.24)
GFR, Estimated: 5 mL/min — ABNORMAL LOW (ref >60)
Glucose, Bld: 95 mg/dL (ref 70–99)
Phosphorus: 8.9 mg/dL — ABNORMAL HIGH (ref 2.5–4.6)
Potassium: 3 mmol/L — ABNORMAL LOW (ref 3.5–5.1)
Sodium: 135 mmol/L (ref 135–145)

## 2023-02-06 LAB — IRON AND TIBC
Iron: 150 ug/dL (ref 45–182)
Saturation Ratios: 53 % — ABNORMAL HIGH (ref 17.9–39.5)
TIBC: 286 ug/dL (ref 250–450)
UIBC: 136 ug/dL

## 2023-02-06 LAB — FERRITIN: Ferritin: 1021 ng/mL — ABNORMAL HIGH (ref 24–336)

## 2023-02-06 LAB — POCT HEMOGLOBIN-HEMACUE: Hemoglobin: 8.2 g/dL — ABNORMAL LOW (ref 13.0–17.0)

## 2023-02-06 MED ORDER — EPOETIN ALFA-EPBX 10000 UNIT/ML IJ SOLN
10000.0000 [IU] | INTRAMUSCULAR | Status: DC
  Administered 2023-02-06: 10000 [IU] via SUBCUTANEOUS

## 2023-02-06 MED ORDER — EPOETIN ALFA-EPBX 10000 UNIT/ML IJ SOLN
INTRAMUSCULAR | Status: AC
  Filled 2023-02-06: qty 1

## 2023-02-06 MED ORDER — ALLOPURINOL 100 MG PO TABS: 100.0000 mg | ORAL_TABLET | Freq: Every day | ORAL | 1 refills | Status: DC

## 2023-02-06 MED ORDER — LABETALOL HCL 300 MG PO TABS: 300.0000 mg | ORAL_TABLET | Freq: Two times a day (BID) | ORAL | 1 refills | Status: DC

## 2023-02-06 MED ORDER — TRIAMCINOLONE ACETONIDE 0.1 % EX CREA: TOPICAL_CREAM | CUTANEOUS | 1 refills | Status: DC

## 2023-02-06 MED ORDER — HYDROXYZINE HCL 25 MG PO TABS: 25.0000 mg | ORAL_TABLET | Freq: Every evening | ORAL | 1 refills | Status: DC | PRN

## 2023-02-06 NOTE — Patient Instructions (Signed)
Low-Purine Eating Plan A low-purine eating plan involves making food choices to limit your purine intake. Purine is a kind of uric acid. Too much uric acid in your blood can cause certain conditions, such as gout and kidney stones. Eating a low-purine diet may help control these conditions. What are tips for following this plan? Shopping Avoid buying products that contain high-fructose corn syrup. Check for this on food labels. It is commonly found in many processed foods and soft drinks. Be sure to check for it in baked goods such as cookies, canned fruits, and cereals and cereal bars. Avoid buying veal, chicken breast with skin, lamb, and organ meats such as liver. These types of meats tend to have the highest purine content. Choose dairy products. These may lower uric acid levels. Avoid certain types of fish. Not all fish and seafood have high purine content. Examples with high purine content include anchovies, trout, tuna, sardines, and salmon. Avoid buying beverages that contain alcohol, particularly beer and hard liquor. Alcohol can affect the way your body gets rid of uric acid. Meal planning  Learn which foods do or do not affect you. If you find out that a food tends to cause your gout symptoms to flare up, avoid eating that food. You can enjoy foods that do not cause problems. If you have any questions about a food item, talk with your dietitian or health care provider. Reduce the overall amount of meat in your diet. When you do eat meat, choose ones with lower purine content. Include plenty of fruits and vegetables. Although some vegetables may have a high purine content--such as asparagus, mushrooms, spinach, or cauliflower--it has been shown that these do not contribute to uric acid blood levels as much. Consume at least 1 dairy serving a day. This has been shown to decrease uric acid levels. General information If you drink alcohol: Limit how much you have to: 0-1 drink a day for  women who are not pregnant. 0-2 drinks a day for men. Know how much alcohol is in a drink. In the [place] one drink equals one 12 oz bottle of beer (355 mL), one 5 oz glass of wine (148 mL), or one 1 oz glass of hard liquor (44 mL). Drink plenty of water. Try to drink enough to keep your urine pale yellow. Fluids can help remove uric acid from your body. Work with your health care provider and dietitian to develop a plan to achieve or maintain a healthy weight. Losing weight may help reduce uric acid in your blood. What foods are recommended? The following are some types of foods that are good choices when limiting purine intake: Fresh or frozen fruits and vegetables. Whole grains, breads, cereals, and pasta. Rice. Beans, peas, legumes. Nuts and seeds. Dairy products. Fats and oils. The items listed above may not be a complete list. Talk with a dietitian about what dietary choices are best for you. What foods are not recommended? Limit your intake of foods high in purines, including: Beer and other alcohol. Meat-based gravy or sauce. Canned or fresh fish, such as: Anchovies, sardines, herring, salmon, and tuna. Mussels and scallops. Codfish, trout, and haddock. Bacon, veal, chicken breast with skin, and lamb. Organ meats, such as: Liver or kidney. Tripe. Sweetbreads (thymus gland or pancreas). Wild game or goose. Yeast or yeast extract supplements. Drinks sweetened with high-fructose corn syrup, such as soda. Processed foods made with high-fructose corn syrup. The items listed above may not be a complete list of foods   and beverages you should limit. Contact a dietitian for more information. Summary Eating a low-purine diet may help control conditions caused by too much uric acid in the body, such as gout or kidney stones. Choose low-purine foods, limit alcohol, and limit high-fructose corn syrup. You will learn over time which foods do or do not affect you. If you find out that a  food tends to cause your gout symptoms to flare up, avoid eating that food. This information is not intended to replace advice given to you by your health care provider. Make sure you discuss any questions you have with your health care provider. Document Revised: 10/05/2021 Document Reviewed: 10/05/2021 Elsevier Patient Education  2023 Elsevier Inc.  

## 2023-02-06 NOTE — Progress Notes (Signed)
Subjective:  Patient ID: Johnny [name], male    DOB: [birth year]  Age: 62 y.o. MRN: [id number]  CC: Hypertension   HPI Johnny [name] is a 62 y.o. year old male with a history of  COPD, Gout, stage V chronic kidney disease, hypertension, sleep apnea, HFrEF (EF 55-60% from 09/2022).  Interval History:  He has been requesting refills on prednisone quite often due to gout flares which occur once/month.  He has been on allopurinol renal dose for prophylaxis but states he stopped colchicine due to its effect on the kidneys and has been using prednisone instead.  He has not been adherent with a low purine eating plan but states he is working on it.  He Complains of generalized itching which occurs all the time . He has eczematous lesion which are sparsely distributed.  Denies sensation of being bitten by insects. Currently in stage V CKD but is yet to commence hemodialysis.  His emphysema is controlled, he is doing well on his antihypertensive. Also on levothyroxine for hypothyroidism. Past Medical History:  Diagnosis Date   Adenomatous colon polyp    tubular   CHF (congestive heart failure)    Chronic kidney disease    COPD (chronic obstructive pulmonary disease)    Diverticulosis    External otitis of right ear 11/10/2018   Hepatitis C    Hypertension    OSA (obstructive sleep apnea) 11/03/2015   Strep throat 01/2021    Past Surgical History:  Procedure Laterality Date   AV FISTULA PLACEMENT  09/12/2009   Left arm AVF    Family History  Problem Relation Age of Onset   Hypertension Mother    Diabetes Mother    Colon cancer Neg Hx     Social History   Socioeconomic History   Marital status: Divorced    Spouse name: Not on file   Number of children: Not on file   Years of education: 14   Highest education level: Not on file  Occupational History   Occupation: Electronics engineer  Tobacco Use   Smoking status: Former    Packs/day: .1    Types: Cigarettes    Quit date:  04/02/2011    Years since quitting: 11.8   Smokeless tobacco: Never   Tobacco comments:    trying to quit  Vaping Use   Vaping Use: Never used  Substance and Sexual Activity   Alcohol use: Never    Alcohol/week: 0.0 standard drinks of alcohol   Drug use: No   Sexual activity: Not Currently  Other Topics Concern   Not on file  Social History Narrative   Lives at home alone.   Right-handed.    Occasional caffeine use.   Social Determinants of Health   Financial Resource Strain: Not on file  Food Insecurity: Food Insecurity Present (06/13/2022)   Hunger Vital Sign    Worried About Running Out of Food in the Last Year: Sometimes true    Ran Out of Food in the Last Year: Sometimes true  Transportation Needs: Unmet Transportation Needs (06/13/2022)   PRAPARE - Hydrologist (Medical): Yes    Lack of Transportation (Non-Medical): Yes  Physical Activity: Not on file  Stress: Not on file  Social Connections: Not on file    Allergies  Allergen Reactions   Naproxen Other (See Comments)    Unknown reaction    Shellfish Allergy Itching    Outpatient Medications Prior to Visit  Medication Sig Dispense Refill  acetaminophen (TYLENOL) 325 MG tablet Take 2 tablets (650 mg total) by mouth every 6 (six) hours as needed for moderate pain or mild pain. 30 tablet 1   albuterol (VENTOLIN HFA) 108 (90 Base) MCG/ACT inhaler Inhale 2 puffs into the lungs every 6 (six) hours as needed for wheezing or shortness of breath. 18 g 2   amLODipine (NORVASC) 10 MG tablet Take 1 tablet (10 mg total) by mouth daily. 30 tablet 6   atorvastatin (LIPITOR) 40 MG tablet Take 1 tablet (40 mg total) by mouth daily. 30 tablet 6   calcitRIOL (ROCALTROL) 0.5 MCG capsule Take 0.5 mcg by mouth daily.     colchicine 0.6 MG tablet Take 2 tabs (1.2 mg) at the onset of a gout flare, may repeat 1 tab (0.6 mg) in 1 hour if symptoms persist 30 tablet 1   doxycycline (VIBRA-TABS) 100 MG tablet Take 1  tablet (100 mg total) by mouth 2 (two) times daily. 20 tablet 0   ipratropium-albuterol (DUONEB) 0.5-2.5 (3) MG/3ML SOLN Inhale 3 mLs into the lungs every 6 (six) hours as needed. 90 mL 0   levothyroxine (SYNTHROID) 100 MCG tablet Take 1 tablet (100 mcg total) by mouth daily. 30 tablet 6   mometasone-formoterol (DULERA) 100-5 MCG/ACT AERO Inhale 2 puffs into the lungs 2 (two) times daily. 13 g 6   sodium bicarbonate 650 MG tablet Take 1 tablet (650 mg total) by mouth 2 (two) times daily. 60 tablet 0   labetalol (NORMODYNE) 300 MG tablet Take 1 tablet (300 mg total) by mouth 2 (two) times daily. 60 tablet 0   triamcinolone cream (KENALOG) 0.1 % APPLY CREAM EXTERNALLY TWICE DAILY 45 g 0   isosorbide mononitrate (IMDUR) 30 MG 24 hr tablet Take 1 tablet (30 mg total) by mouth daily. 30 tablet 2   mupirocin ointment (BACTROBAN) 2 % Apply 1 Application topically 2 (two) times daily. (Patient not taking: Reported on 02/06/2023) 22 g 0   predniSONE (DELTASONE) 20 MG tablet Take 1 tablet by mouth once daily with breakfast (Patient not taking: Reported on 02/06/2023) 5 tablet 0   torsemide (DEMADEX) 100 MG tablet Take 1 tablet (100 mg total) by mouth daily. 30 tablet 0   allopurinol (ZYLOPRIM) 100 MG tablet Take 1 tablet (100 mg total) by mouth daily. 30 tablet 0   Facility-Administered Medications Prior to Visit  Medication Dose Route Frequency Provider Last Rate Last Admin   epoetin alfa-epbx (RETACRIT) 24401 UNIT/ML injection            epoetin alfa-epbx (RETACRIT) injection 10,000 Units  10,000 Units Subcutaneous Q14 Days Reesa Chew, MD   10,000 Units at 02/06/23 1042     ROS Review of Systems  Constitutional:  Negative for activity change and appetite change.  HENT:  Negative for sinus pressure and sore throat.   Respiratory:  Negative for chest tightness, shortness of breath and wheezing.   Cardiovascular:  Negative for chest pain and palpitations.  Gastrointestinal:  Negative for abdominal  distention, abdominal pain and constipation.  Genitourinary: Negative.   Musculoskeletal: Negative.   Psychiatric/Behavioral:  Negative for behavioral problems and dysphoric mood.     Objective:  BP 125/73   Pulse 70   Ht 6\' 1"  (1.854 m)   Wt 189 lb 6.4 oz (85.9 kg)   SpO2 98%   BMI 24.99 kg/m      02/06/2023    2:41 PM 02/06/2023   10:24 AM 01/23/2023   10:05 AM  BP/Weight  Systolic  BP 0000000 [id number]  Diastolic BP 73 75 71  Wt. (Lbs) 189.4    BMI 24.99 kg/m2        Physical Exam Constitutional:      Appearance: He is well-developed.  Cardiovascular:     Rate and Rhythm: Normal rate.     Heart sounds: Normal heart sounds. No murmur heard. Pulmonary:     Effort: Pulmonary effort is normal.     Breath sounds: Normal breath sounds. No wheezing or rales.  Chest:     Chest wall: No tenderness.  Abdominal:     General: Bowel sounds are normal. There is no distension.     Palpations: Abdomen is soft. There is no mass.     Tenderness: There is no abdominal tenderness.  Musculoskeletal:        General: Normal range of motion.     Right lower leg: No edema.     Left lower leg: No edema.     Comments: Left arm AV fistula  Neurological:     Mental Status: He is alert and oriented to person, place, and time.  Psychiatric:        Mood and Affect: Mood normal.        Latest Ref Rng & Units 02/06/2023   10:31 AM 09/25/2022    6:09 AM 09/24/2022   12:40 AM  CMP  Glucose 70 - 99 mg/dL 95  101  102   BUN 6 - 20 mg/dL 156  126  128   Creatinine 0.61 - 1.24 mg/dL 10.12  9.50  9.35   Sodium 135 - 145 mmol/L 135  137  136   Potassium 3.5 - 5.1 mmol/L 3.0  3.6  4.0   Chloride 98 - 111 mmol/L 98  95  99   CO2 22 - 32 mmol/L 18  25  22    Calcium 8.9 - 10.3 mg/dL 8.6  8.5  8.5     Lipid Panel     Component Value Date/Time   CHOL 123 05/24/2022 0341   CHOL 214 (H) 11/10/2018 1104   TRIG 44 05/24/2022 0341   HDL 65 05/24/2022 0341   HDL 96 11/10/2018 1104   CHOLHDL 1.9  05/24/2022 0341   VLDL 9 05/24/2022 0341   LDLCALC 49 05/24/2022 0341   LDLCALC 102 (H) 11/10/2018 1104    CBC    Component Value Date/Time   WBC 10.5 09/21/2022 0347   RBC 3.45 (L) 09/21/2022 0347   HGB 8.2 (L) 02/06/2023 1038   HGB 14.2 11/08/2020 1654   HCT 25.3 (L) 09/21/2022 0347   HCT 41.7 11/08/2020 1654   PLT 201 09/21/2022 0347   PLT 183 11/08/2020 1654   MCV 73.3 (L) 09/21/2022 0347   MCV 77 (L) 11/08/2020 1654   MCH 25.2 (L) 09/21/2022 0347   MCHC 34.4 09/21/2022 0347   RDW 18.4 (H) 09/21/2022 0347   RDW 15.8 (H) 11/08/2020 1654   LYMPHSABS 0.9 09/21/2022 0347   LYMPHSABS 2.5 11/08/2020 1654   MONOABS 0.3 09/21/2022 0347   EOSABS 0.1 09/21/2022 0347   EOSABS 0.4 11/08/2020 1654   BASOSABS 0.0 09/21/2022 0347   BASOSABS 0.1 11/08/2020 1654    Lab Results  Component Value Date   HGBA1C 5.3 05/24/2022    Lab Results  Component Value Date   TSH 1.300 11/07/2022    Assessment & Plan:  1. Other atopic dermatitis Could explain his pruritus - triamcinolone cream (KENALOG) 0.1 %; APPLY CREAM EXTERNALLY TWICE DAILY  Dispense:  45 g; Refill: 1  2. Idiopathic chronic gout without tophus, unspecified site Uncontrolled with frequent flares due to nonadherence with a low purine eating plan Continue with renal dose of allopurinol for gout prophylaxis Counseled about adverse effects of prednisone including increased risk for diabetes, osteopenia - allopurinol (ZYLOPRIM) 100 MG tablet; Take 1 tablet (100 mg total) by mouth daily.  Dispense: 90 tablet; Refill: 1  3. Essential hypertension Controlled Counseled on blood pressure goal of less than 130/80, low-sodium, DASH diet, medication compliance, 150 minutes of moderate intensity exercise per week. Discussed medication compliance, adverse effects. - labetalol (NORMODYNE) 300 MG tablet; Take 1 tablet (300 mg total) by mouth 2 (two) times daily.  Dispense: 180 tablet; Refill: 1  4. Screening for diabetes mellitus -  Hemoglobin A1c  5. Acquired hypothyroidism Controlled Continue levothyroxine - T4, free - TSH - T3  6. CKD (chronic kidney disease) stage 5, GFR less than 15 ml/min He is yet to begin hemodialysis Follow-up with nephrology  7. Centrilobular emphysema Stable Continue Dulera, SABA  8. Pruritus Symptoms could be secondary to uremia in addition to history of atopic dermatitis No lesions noticeable on exam Triamcinolone prescribed, will add on hydroxyzine - hydrOXYzine (ATARAX) 25 MG tablet; Take 1 tablet (25 mg total) by mouth at bedtime as needed.  Dispense: 30 tablet; Refill: 1    Meds ordered this encounter  Medications   triamcinolone cream (KENALOG) 0.1 %    Sig: APPLY CREAM EXTERNALLY TWICE DAILY    Dispense:  45 g    Refill:  1   allopurinol (ZYLOPRIM) 100 MG tablet    Sig: Take 1 tablet (100 mg total) by mouth daily.    Dispense:  90 tablet    Refill:  1   labetalol (NORMODYNE) 300 MG tablet    Sig: Take 1 tablet (300 mg total) by mouth 2 (two) times daily.    Dispense:  180 tablet    Refill:  1   hydrOXYzine (ATARAX) 25 MG tablet    Sig: Take 1 tablet (25 mg total) by mouth at bedtime as needed.    Dispense:  30 tablet    Refill:  1    Follow-up: Return in about 6 months (around 08/08/2023) for Chronic medical conditions.       Charlott Rakes, MD, FAAFP. [facility] and [place], [place]   02/06/2023, 3:20 PM

## 2023-02-07 ENCOUNTER — Other Ambulatory Visit: Payer: Self-pay | Admitting: Family Medicine

## 2023-02-07 DIAGNOSIS — E039 Hypothyroidism, unspecified: Secondary | ICD-10-CM

## 2023-02-07 LAB — T4, FREE: Free T4: 1.49 ng/dL (ref 0.82–1.77)

## 2023-02-07 LAB — HEMOGLOBIN A1C
Est. average glucose Bld gHb Est-mCnc: 105 mg/dL
Hgb A1c MFr Bld: 5.3 % (ref 4.8–5.6)

## 2023-02-07 LAB — TSH: TSH: 0.049 u[IU]/mL — ABNORMAL LOW (ref 0.450–4.500)

## 2023-02-07 LAB — PTH, INTACT AND CALCIUM
Calcium, Total (PTH): 8.5 mg/dL — ABNORMAL LOW (ref 8.7–10.2)
PTH: 106 pg/mL — ABNORMAL HIGH (ref 15–65)

## 2023-02-07 LAB — T3: T3, Total: 101 ng/dL (ref 71–180)

## 2023-02-07 MED ORDER — LEVOTHYROXINE SODIUM 88 MCG PO TABS: 88.0000 ug | ORAL_TABLET | Freq: Every day | ORAL | 3 refills | Status: DC

## 2023-02-14 DIAGNOSIS — N2581 Secondary hyperparathyroidism of renal origin: Secondary | ICD-10-CM | POA: Diagnosis not present

## 2023-02-14 DIAGNOSIS — I770 Arteriovenous fistula, acquired: Secondary | ICD-10-CM | POA: Diagnosis not present

## 2023-02-14 DIAGNOSIS — I120 Hypertensive chronic kidney disease with stage 5 chronic kidney disease or end stage renal disease: Secondary | ICD-10-CM | POA: Diagnosis not present

## 2023-02-14 DIAGNOSIS — E872 Acidosis, unspecified: Secondary | ICD-10-CM | POA: Diagnosis not present

## 2023-02-14 DIAGNOSIS — D631 Anemia in chronic kidney disease: Secondary | ICD-10-CM | POA: Diagnosis not present

## 2023-02-14 DIAGNOSIS — E785 Hyperlipidemia, unspecified: Secondary | ICD-10-CM | POA: Diagnosis not present

## 2023-02-14 DIAGNOSIS — I5030 Unspecified diastolic (congestive) heart failure: Secondary | ICD-10-CM | POA: Diagnosis not present

## 2023-02-14 DIAGNOSIS — N185 Chronic kidney disease, stage 5: Secondary | ICD-10-CM | POA: Diagnosis not present

## 2023-02-20 ENCOUNTER — Encounter (HOSPITAL_COMMUNITY)
Admission: RE | Admit: 2023-02-20 | Discharge: 2023-02-20 | Disposition: A | Discharge status: Home / Self Care | Payer: Medicaid Other | Source: Ambulatory Visit | Attending: Internal Medicine | Admitting: Internal Medicine

## 2023-02-20 ENCOUNTER — Encounter: Payer: Self-pay | Admitting: Family Medicine

## 2023-02-20 VITALS — BP 138/76 | HR 84 | Temp 97.4°F | Resp 17

## 2023-02-20 DIAGNOSIS — N185 Chronic kidney disease, stage 5: Secondary | ICD-10-CM

## 2023-02-20 LAB — POCT HEMOGLOBIN-HEMACUE: Hemoglobin: 9.6 g/dL — ABNORMAL LOW (ref 13.0–17.0)

## 2023-02-20 MED ORDER — EPOETIN ALFA-EPBX 10000 UNIT/ML IJ SOLN
INTRAMUSCULAR | Status: AC
  Filled 2023-02-20: qty 2

## 2023-02-20 MED ORDER — EPOETIN ALFA-EPBX 10000 UNIT/ML IJ SOLN
20000.0000 [IU] | INTRAMUSCULAR | Status: DC
  Administered 2023-02-20: 20000 [IU] via SUBCUTANEOUS

## 2023-02-22 ENCOUNTER — Encounter: Payer: Self-pay | Admitting: Gastroenterology

## 2023-02-28 ENCOUNTER — Telehealth: Payer: Self-pay | Admitting: *Deleted

## 2023-02-28 NOTE — Telephone Encounter (Signed)
Pt.scheduled as recall colon on 04/05/23,in reviewing PCP note pt. Has a dx of CKD,in her notes he is yet to commence to hemodialysis,please review chart and advise?

## 2023-02-28 NOTE — Telephone Encounter (Signed)
Pt.schedule Wednesday 05/15/23 @ 3 pm with Dr. Alvino Chapel and procedure canceled.pt aware that he has to see Dr. Prior to having procedure done.

## 2023-02-28 NOTE — Telephone Encounter (Signed)
Patient's labs suggest he is nearing the need for dialysis.  Cannot have procedure in the [facility].  Cancel colonoscopy and make new patient clinic visit where we can discuss colon cancer screening options.  Most likely not a candidate for colonoscopy until after stable on dialysis.  - HD

## 2023-03-01 ENCOUNTER — Other Ambulatory Visit: Payer: Self-pay | Admitting: Family Medicine

## 2023-03-05 ENCOUNTER — Other Ambulatory Visit: Payer: Self-pay | Admitting: Family Medicine

## 2023-03-05 DIAGNOSIS — E039 Hypothyroidism, unspecified: Secondary | ICD-10-CM

## 2023-03-05 NOTE — Telephone Encounter (Unsigned)
Copied from CRM [id number]. Topic: General - Other >> Mar 05, 2023 12:00 PM Everette C wrote: Reason for CRM: Medication Refill - Medication: levothyroxine (SYNTHROID) 88 MCG tablet [[id number]]  predniSONE (DELTASONE) 20 MG tablet [[id number]]  Has the patient contacted their pharmacy? Yes.   (Agent: If no, request that the patient contact the pharmacy for the refill. If patient does not wish to contact the pharmacy document the reason why and proceed with request.) (Agent: If yes, when and what did the pharmacy advise?)  Preferred Pharmacy (with phone number or street name): [id number] - [place] ([place] - [address] [place] [address] Phone: [phone] Fax: [phone] Hours: Not open 24 hours   Has the patient been seen for an appointment in the last year OR does the patient have an upcoming appointment? Yes.    Agent: Please be advised that RX refills may take up to 3 business days. We ask that you follow-up with your pharmacy.

## 2023-03-06 ENCOUNTER — Encounter (HOSPITAL_COMMUNITY): Payer: Medicaid Other

## 2023-03-06 NOTE — Telephone Encounter (Signed)
Rx 02/07/23 #30 3RF - too soon Requested Prescriptions  Pending Prescriptions Disp Refills   levothyroxine (SYNTHROID) 88 MCG tablet 30 tablet 3    Sig: Take 1 tablet (88 mcg total) by mouth daily before breakfast.     Endocrinology:  Hypothyroid Agents Failed - 03/05/2023  2:36 PM      Failed - TSH in normal range and within 360 days    TSH  Date Value Ref Range Status  02/06/2023 0.049 (L) 0.450 - 4.500 uIU/mL Final         Passed - Valid encounter within last 12 months    Recent Outpatient Visits           4 weeks ago Screening for diabetes mellitus   [facility] [place], [place], [place]   2 months ago Attention deficit   [facility] [place], Odette Horns, MD   3 months ago CKD (chronic kidney disease) stage 5, GFR less than 15 ml/min [facility])   [place] [facility] [place], [place], [place]   8 months ago Chronic kidney disease (CKD), stage V (HCC)   [facility] [place], [place], [place]   9 months ago Right foot pain   [facility] [place], [place], [place]               predniSONE (DELTASONE) 20 MG tablet 5 tablet 0    Sig: Take 1 tablet (20 mg total) by mouth daily with breakfast.     Not Delegated - Endocrinology:  Oral Corticosteroids Failed - 03/05/2023  2:36 PM      Failed - This refill cannot be delegated      Failed - Manual Review: Eye exam for IOP if prolonged treatment      Failed - K in normal range and within 180 days    Potassium  Date Value Ref Range Status  02/06/2023 3.0 (L) 3.5 - 5.1 mmol/L Final         Failed - Bone Mineral Density or Dexa Scan completed in the last 2 years      Passed - Glucose (serum) in normal range and within 180 days    Glucose, Bld  Date Value Ref Range Status  02/06/2023 95 70 - 99 mg/dL Final    Comment:    Glucose reference range applies only to samples taken after fasting for at  least 8 hours.         Passed - Na in normal range and within 180 days    Sodium  Date Value Ref Range Status  02/06/2023 135 135 - 145 mmol/L Final  11/08/2020 135 134 - 144 mmol/L Final         Passed - Last BP in normal range    BP Readings from Last 1 Encounters:  02/20/23 138/76         Passed - Valid encounter within last 6 months    Recent Outpatient Visits           4 weeks ago Screening for diabetes mellitus   [place] [facility] [place], [place]   2 months ago Attention deficit   [facility] [address], Odette Horns, [place]   3 months ago CKD (chronic kidney disease) stage 5, GFR less than 15 ml/min [facility])    [facility] [place], [place], [place]  8 months ago Chronic kidney disease (CKD), stage V [facility])   [place] [facility] Hoy Register, [place]   9 months ago Right foot pain   [place] [facility] [place] Hoy Register, MD

## 2023-03-06 NOTE — Telephone Encounter (Signed)
Requested medication (s) are due for refill today -no  Requested medication (s) are on the active medication list -no  Future visit scheduled -no  Last refill: 01/24/23 #5  Notes to clinic: non delegated Rx  Requested Prescriptions  Pending Prescriptions Disp Refills   predniSONE (DELTASONE) 20 MG tablet 5 tablet 0    Sig: Take 1 tablet (20 mg total) by mouth daily with breakfast.     Not Delegated - Endocrinology:  Oral Corticosteroids Failed - 03/05/2023  2:36 PM      Failed - This refill cannot be delegated      Failed - Manual Review: Eye exam for IOP if prolonged treatment      Failed - K in normal range and within 180 days    Potassium  Date Value Ref Range Status  02/06/2023 3.0 (L) 3.5 - 5.1 mmol/L Final         Failed - Bone Mineral Density or Dexa Scan completed in the last 2 years      Passed - Glucose (serum) in normal range and within 180 days    Glucose, Bld  Date Value Ref Range Status  02/06/2023 95 70 - 99 mg/dL Final    Comment:    Glucose reference range applies only to samples taken after fasting for at least 8 hours.         Passed - Na in normal range and within 180 days    Sodium  Date Value Ref Range Status  02/06/2023 135 135 - 145 mmol/L Final  11/08/2020 135 134 - 144 mmol/L Final         Passed - Last BP in normal range    BP Readings from Last 1 Encounters:  02/20/23 138/76         Passed - Valid encounter within last 6 months    Recent Outpatient Visits           4 weeks ago Screening for diabetes mellitus   [place] [facility] [place], Odette Horns, MD   2 months ago Attention deficit   [place] [facility] [place], Odette Horns, MD   3 months ago CKD (chronic kidney disease) stage 5, GFR less than 15 ml/min [facility])   [place] [facility] [place], [place], [place]   8 months ago Chronic kidney disease (CKD), stage V [facility])   [place] [facility] [place], [place], [place]   9 months ago Right foot pain   [place] [place], [place], [place]              Refused Prescriptions Disp Refills   levothyroxine (SYNTHROID) 88 MCG tablet 30 tablet 3    Sig: Take 1 tablet (88 mcg total) by mouth daily before breakfast.     Endocrinology:  Hypothyroid Agents Failed - 03/05/2023  2:36 PM      Failed - TSH in normal range and within 360 days    TSH  Date Value Ref Range Status  02/06/2023 0.049 (L) 0.450 - 4.500 uIU/mL Final         Passed - Valid encounter within last 12 months    Recent Outpatient Visits           4 weeks ago Screening for diabetes mellitus   [place] [facility] Hoy Register, MD   2 months ago Attention deficit   [facility] Hoy Register, MD   3 months ago CKD (chronic kidney disease) stage 5, GFR less than 15 ml/min [facility])   [place] [facility] [place], [place], [place]   8 months ago Chronic kidney disease (CKD), stage V [facility])   [place] [place], Odette Horns, MD   9 months ago Right foot pain   [place] [facility] Hoy Register, MD                 Requested Prescriptions  Pending Prescriptions Disp Refills   predniSONE (DELTASONE) 20 MG tablet 5 tablet 0    Sig: Take 1 tablet (20 mg total) by mouth daily with breakfast.     Not Delegated - Endocrinology:  Oral Corticosteroids Failed - 03/05/2023  2:36 PM      Failed - This refill cannot be delegated      Failed - Manual Review: Eye exam for IOP if prolonged treatment      Failed - K in normal range and within 180 days    Potassium  Date Value Ref Range Status  02/06/2023 3.0 (L) 3.5 - 5.1 mmol/L Final         Failed - Bone Mineral Density or Dexa Scan completed in the last 2 years      Passed - Glucose (serum) in normal range and within 180 days     Glucose, Bld  Date Value Ref Range Status  02/06/2023 95 70 - 99 mg/dL Final    Comment:    Glucose reference range applies only to samples taken after fasting for at least 8 hours.         Passed - Na in normal range and within 180 days    Sodium  Date Value Ref Range Status  02/06/2023 135 135 - 145 mmol/L Final  11/08/2020 135 134 - 144 mmol/L Final         Passed - Last BP in normal range    BP Readings from Last 1 Encounters:  02/20/23 138/76         Passed - Valid encounter within last 6 months    Recent Outpatient Visits           4 weeks ago Screening for diabetes mellitus   [place] [facility] [place], Odette Horns, MD   2 months ago Attention deficit   [place] [place], Odette Horns, MD   3 months ago CKD (chronic kidney disease) stage 5, GFR less than 15 ml/min [facility])   [facility] [place], [place], [place]   8 months ago Chronic kidney disease (CKD), stage V [facility])   [place] [facility] [place], [place], [place]   9 months ago Right foot pain   [place] [facility] [place], [place], [place]              Refused Prescriptions Disp Refills   levothyroxine (SYNTHROID) 88 MCG tablet 30 tablet 3    Sig: Take 1 tablet (88 mcg total) by mouth daily before breakfast.     Endocrinology:  Hypothyroid Agents Failed - 03/05/2023  2:36 PM      Failed - TSH in normal range and within 360 days    TSH  Date Value Ref Range Status  02/06/2023 0.049 (L) 0.450 - 4.500 uIU/mL Final         Passed -  Valid encounter within last 12 months    Recent Outpatient Visits           4 weeks ago Screening for diabetes mellitus   [place] [facility] Hoy Register, MD   2 months ago Attention deficit   [place] [facility], Odette Horns, MD   3 months ago CKD (chronic kidney disease) stage 5, GFR  less than 15 ml/min [facility])   [facility] [place], [place], [place]   8 months ago Chronic kidney disease (CKD), stage V [facility])   [facility] Hoy Register, MD   9 months ago Right foot pain   [place] [facility] Hoy Register, MD

## 2023-03-12 ENCOUNTER — Encounter (HOSPITAL_COMMUNITY)
Admission: RE | Admit: 2023-03-12 | Discharge: 2023-03-12 | Disposition: A | Discharge status: Home / Self Care | Payer: Medicaid Other | Source: Ambulatory Visit | Attending: Internal Medicine | Admitting: Internal Medicine

## 2023-03-12 VITALS — BP 118/72 | HR 71 | Temp 97.3°F | Resp 17

## 2023-03-12 DIAGNOSIS — N185 Chronic kidney disease, stage 5: Secondary | ICD-10-CM

## 2023-03-12 LAB — IRON AND TIBC
Iron: 71 ug/dL (ref 45–182)
Saturation Ratios: 27 % (ref 17.9–39.5)
TIBC: 259 ug/dL (ref 250–450)
UIBC: 188 ug/dL

## 2023-03-12 LAB — RENAL FUNCTION PANEL
Albumin: 3.2 g/dL — ABNORMAL LOW (ref 3.5–5.0)
Anion gap: 16 — ABNORMAL HIGH (ref 5–15)
BUN: 111 mg/dL — ABNORMAL HIGH (ref 6–20)
CO2: 19 mmol/L — ABNORMAL LOW (ref 22–32)
Calcium: 6.3 mg/dL — CL (ref 8.9–10.3)
Chloride: 98 mmol/L (ref 98–111)
Creatinine, Ser: 11.22 mg/dL — ABNORMAL HIGH (ref 0.61–1.24)
GFR, Estimated: 5 mL/min — ABNORMAL LOW (ref >60)
Glucose, Bld: 93 mg/dL (ref 70–99)
Phosphorus: 5.2 mg/dL — ABNORMAL HIGH (ref 2.5–4.6)
Potassium: 3.6 mmol/L (ref 3.5–5.1)
Sodium: 133 mmol/L — ABNORMAL LOW (ref 135–145)

## 2023-03-12 LAB — FERRITIN: Ferritin: 633 ng/mL — ABNORMAL HIGH (ref 24–336)

## 2023-03-12 MED ORDER — EPOETIN ALFA-EPBX 10000 UNIT/ML IJ SOLN
INTRAMUSCULAR | Status: AC
  Administered 2023-03-12: 20000 [IU] via SUBCUTANEOUS
  Filled 2023-03-12: qty 2

## 2023-03-12 MED ORDER — EPOETIN ALFA-EPBX 10000 UNIT/ML IJ SOLN: 20000.0000 [IU] | INTRAMUSCULAR | Status: DC

## 2023-03-12 NOTE — Progress Notes (Signed)
Critical labs called to [facility]

## 2023-03-13 LAB — POCT HEMOGLOBIN-HEMACUE: Hemoglobin: 10.4 g/dL — ABNORMAL LOW (ref 13.0–17.0)

## 2023-03-14 LAB — PTH, INTACT AND CALCIUM
Calcium, Total (PTH): 6.4 mg/dL (ref 8.7–10.2)
PTH: 200 pg/mL — ABNORMAL HIGH (ref 15–65)

## 2023-03-15 ENCOUNTER — Other Ambulatory Visit: Payer: Self-pay | Admitting: Family Medicine

## 2023-03-15 ENCOUNTER — Telehealth: Payer: Self-pay | Admitting: Family Medicine

## 2023-03-15 MED ORDER — PREDNISONE 20 MG PO TABS: 20.0000 mg | ORAL_TABLET | Freq: Every day | ORAL | 0 refills | Status: DC

## 2023-03-15 NOTE — Telephone Encounter (Signed)
Prescription sent to Pharmacy.  

## 2023-03-15 NOTE — Telephone Encounter (Signed)
Medication Refill - Medication: predniSONE (DELTASONE) 20 MG tablet   Has the patient contacted their pharmacy? yes    Preferred Pharmacy (with phone number or street name):  [id number] - [place] ([place] - [address]  [address] [place] [address]  Phone: [phone] Fax: [phone]  Hours: Not open 24 hours     Has the patient been seen for an appointment in the last year OR does the patient have an upcoming appointment? Yes.    Agent: Please be advised that RX refills may take up to 3 business days. We ask that you follow-up with your pharmacy.

## 2023-03-15 NOTE — Telephone Encounter (Signed)
Pt is calling to check on the status of his medication. Pt says he has called numerous times and would like to know when he could expect this to be filled. Please advise.   predniSONE (DELTASONE) 20 MG tablet

## 2023-03-15 NOTE — Telephone Encounter (Signed)
Routing to PCP for review. Request is for gout flare.

## 2023-03-15 NOTE — Telephone Encounter (Signed)
Requested medication (s) are due for refill today: -  Requested medication (s) are on the active medication list: yes  Last refill:  01/24/23 #5  Future visit scheduled: no  Notes to clinic:  med not delegated to NT to RF-    Requested Prescriptions  Pending Prescriptions Disp Refills   predniSONE (DELTASONE) 20 MG tablet 5 tablet 0    Sig: Take 1 tablet (20 mg total) by mouth daily with breakfast.     Not Delegated - Endocrinology:  Oral Corticosteroids Failed - 03/15/2023  2:58 PM      Failed - This refill cannot be delegated      Failed - Manual Review: Eye exam for IOP if prolonged treatment      Failed - Na in normal range and within 180 days    Sodium  Date Value Ref Range Status  03/12/2023 133 (L) 135 - 145 mmol/L Final  11/08/2020 135 134 - 144 mmol/L Final         Failed - Bone Mineral Density or Dexa Scan completed in the last 2 years      Passed - Glucose (serum) in normal range and within 180 days    Glucose, Bld  Date Value Ref Range Status  03/12/2023 93 70 - 99 mg/dL Final    Comment:    Glucose reference range applies only to samples taken after fasting for at least 8 hours.         Passed - K in normal range and within 180 days    Potassium  Date Value Ref Range Status  03/12/2023 3.6 3.5 - 5.1 mmol/L Final         Passed - Last BP in normal range    BP Readings from Last 1 Encounters:  03/12/23 118/72         Passed - Valid encounter within last 6 months    Recent Outpatient Visits           1 month ago Screening for diabetes mellitus   [place] [facility] Hoy Register, MD   2 months ago Attention deficit   [place] [facility] [place], Odette Horns, MD   4 months ago CKD (chronic kidney disease) stage 5, GFR less than 15 ml/min [facility])   [place] [facility] [place], [place], [place]   8 months ago Chronic kidney disease (CKD), stage V [facility])   [place] [facility] Hoy Register, MD   9 months ago Right foot pain   [place] [facility] Hoy Register, MD

## 2023-03-18 DIAGNOSIS — N2581 Secondary hyperparathyroidism of renal origin: Secondary | ICD-10-CM | POA: Diagnosis not present

## 2023-03-18 DIAGNOSIS — N185 Chronic kidney disease, stage 5: Secondary | ICD-10-CM | POA: Diagnosis not present

## 2023-03-18 DIAGNOSIS — M109 Gout, unspecified: Secondary | ICD-10-CM | POA: Diagnosis not present

## 2023-03-18 DIAGNOSIS — J449 Chronic obstructive pulmonary disease, unspecified: Secondary | ICD-10-CM | POA: Diagnosis not present

## 2023-03-18 DIAGNOSIS — I120 Hypertensive chronic kidney disease with stage 5 chronic kidney disease or end stage renal disease: Secondary | ICD-10-CM | POA: Diagnosis not present

## 2023-03-18 DIAGNOSIS — D631 Anemia in chronic kidney disease: Secondary | ICD-10-CM | POA: Diagnosis not present

## 2023-03-18 DIAGNOSIS — E872 Acidosis, unspecified: Secondary | ICD-10-CM | POA: Diagnosis not present

## 2023-03-18 NOTE — Telephone Encounter (Signed)
VM currently full.

## 2023-03-19 LAB — RENAL FUNCTION PANEL: EGFR: 5

## 2023-03-20 ENCOUNTER — Encounter (HOSPITAL_COMMUNITY): Payer: Medicaid Other

## 2023-03-21 ENCOUNTER — Telehealth: Payer: Self-pay

## 2023-03-21 DIAGNOSIS — K0889 Other specified disorders of teeth and supporting structures: Secondary | ICD-10-CM

## 2023-03-21 NOTE — Telephone Encounter (Signed)
Copied from CRM [id number]. Topic: Referral - Request for Referral >> Mar 21, 2023  9:57 AM Epimenio Foot F wrote: Has patient seen PCP for this complaint? Yes.   *If NO, is insurance requiring patient see PCP for this issue before PCP can refer them? Referral for which specialty: Dentistry Preferred provider/office: Any office that accepts Medicare. Reason for referral: Teeth issues   Pt would like a follow up call when the referral is put in.

## 2023-03-22 NOTE — Telephone Encounter (Signed)
Mailbox is full.

## 2023-03-22 NOTE — Telephone Encounter (Signed)
Attempted to get more information from patient but phone call was unanswered. Pt is requesting dental referral

## 2023-03-22 NOTE — Telephone Encounter (Signed)
Referral has been placed. 

## 2023-03-22 NOTE — Addendum Note (Signed)
Addended by: Hoy Register on: 03/22/2023 12:53 PM   Modules accepted: Orders

## 2023-03-26 ENCOUNTER — Ambulatory Visit (HOSPITAL_COMMUNITY)
Admission: RE | Admit: 2023-03-26 | Discharge: 2023-03-26 | Disposition: A | Discharge status: Home / Self Care | Payer: Medicaid Other | Source: Ambulatory Visit | Attending: Internal Medicine | Admitting: Internal Medicine

## 2023-03-26 VITALS — BP 127/77 | HR 85 | Temp 97.4°F | Resp 17

## 2023-03-26 DIAGNOSIS — N185 Chronic kidney disease, stage 5: Secondary | ICD-10-CM | POA: Diagnosis not present

## 2023-03-26 LAB — POCT HEMOGLOBIN-HEMACUE: Hemoglobin: 10 g/dL — ABNORMAL LOW (ref 13.0–17.0)

## 2023-03-26 MED ORDER — EPOETIN ALFA-EPBX 10000 UNIT/ML IJ SOLN: 20000.0000 [IU] | INTRAMUSCULAR | Status: DC

## 2023-03-26 MED ORDER — EPOETIN ALFA-EPBX 10000 UNIT/ML IJ SOLN
INTRAMUSCULAR | Status: AC
  Administered 2023-03-26: 20000 [IU] via SUBCUTANEOUS
  Filled 2023-03-26: qty 2

## 2023-04-05 ENCOUNTER — Encounter: Payer: Medicaid Other | Admitting: Gastroenterology

## 2023-04-09 ENCOUNTER — Encounter (HOSPITAL_COMMUNITY): Payer: Medicaid Other

## 2023-04-10 ENCOUNTER — Other Ambulatory Visit: Payer: Self-pay | Admitting: Family Medicine

## 2023-04-10 DIAGNOSIS — L2089 Other atopic dermatitis: Secondary | ICD-10-CM

## 2023-04-11 ENCOUNTER — Ambulatory Visit: Payer: Self-pay

## 2023-04-11 NOTE — Telephone Encounter (Signed)
2nd attempt, called pt, no answer, VM full.

## 2023-04-11 NOTE — Telephone Encounter (Signed)
Pt called, unable to LVM d/t VM full.   Summary: athletes foot?   Pt is calling to see if something could be prescribed for his athletes foot. Pt has been taking over the counter creams and foot powder and it is not working.  Please advise.

## 2023-04-12 ENCOUNTER — Telehealth: Payer: Self-pay | Admitting: Family Medicine

## 2023-04-12 NOTE — Telephone Encounter (Signed)
Noted  

## 2023-04-12 NOTE — Telephone Encounter (Signed)
Routing to PCP for review.

## 2023-04-12 NOTE — Telephone Encounter (Signed)
Patient scheduled VV to Discuss with PCP. Patient aware

## 2023-04-12 NOTE — Telephone Encounter (Signed)
I will address at his upcoming virtual visit.

## 2023-04-12 NOTE — Telephone Encounter (Signed)
Copied from CRM [id number]. Topic: Referral - Request for Referral >> Apr 11, 2023  3:49 PM Dondra Prader A wrote: Has patient seen PCP for this complaint? No. *If NO, is insurance requiring patient see PCP for this issue before PCP can refer them? Referral for which specialty: foot doctor Preferred provider/office: would like for PCP to refer Reason for referral: pt is needing to be seen for his toe nails and pain in certain areas of his feet

## 2023-04-12 NOTE — Telephone Encounter (Signed)
Will address at upcoming virtual visit.

## 2023-04-15 ENCOUNTER — Telehealth (HOSPITAL_BASED_OUTPATIENT_CLINIC_OR_DEPARTMENT_OTHER): Payer: Medicaid Other | Admitting: Family Medicine

## 2023-04-15 DIAGNOSIS — B353 Tinea pedis: Secondary | ICD-10-CM | POA: Diagnosis not present

## 2023-04-15 MED ORDER — TERBINAFINE HCL 1 % EX CREA
1.0000 | TOPICAL_CREAM | Freq: Two times a day (BID) | CUTANEOUS | 1 refills | Status: DC
Start: 2023-04-15 — End: 2023-11-29

## 2023-04-15 NOTE — Progress Notes (Signed)
Virtual Visit via Telephone Note  I connected with Johnny [name], on 04/15/2023 at 9:15 AM by telephone and verified that I am speaking with the correct person using two identifiers.   Consent: I discussed the limitations, risks, security and privacy concerns of performing an evaluation and management service by telephone and the availability of in person appointments. I also discussed with the patient that there may be a patient responsible charge related to this service. The patient expressed understanding and agreed to proceed.   Location of Patient: Out and about  Location of Provider: Clinic   Persons participating in Telemedicine visit: Johnny [name] Dr. Alvis Lemmings     History of Present Illness: Johnny [name] is a 62 y.o. year old male with a history of  COPD, Gout, stage V chronic kidney disease, hypertension, sleep apnea, HFrEF (EF 55-60% from 09/2022).     He would like a Prescription for Athlete's foot as OTC cream have been ineffective. He has itching but no discoloration between his toes. Also requests a referral to podiatry. He also has a history of frequent gout flares. Past Medical History:  Diagnosis Date   Adenomatous colon polyp    tubular   CHF (congestive heart failure) (HCC)    Chronic kidney disease    COPD (chronic obstructive pulmonary disease) (HCC)    Diverticulosis    External otitis of right ear 11/10/2018   Hepatitis C    Hypertension    OSA (obstructive sleep apnea) 11/03/2015   Strep throat 01/2021   Allergies  Allergen Reactions   Naproxen Other (See Comments)    Unknown reaction    Shellfish Allergy Itching    Current Outpatient Medications on File Prior to Visit  Medication Sig Dispense Refill   acetaminophen (TYLENOL) 325 MG tablet Take 2 tablets (650 mg total) by mouth every 6 (six) hours as needed for moderate pain or mild pain. 30 tablet 1   albuterol (VENTOLIN HFA) 108 (90 Base) MCG/ACT inhaler Inhale 2 puffs into the lungs  every 6 (six) hours as needed for wheezing or shortness of breath. 18 g 2   allopurinol (ZYLOPRIM) 100 MG tablet Take 1 tablet (100 mg total) by mouth daily. 90 tablet 1   amLODipine (NORVASC) 10 MG tablet Take 1 tablet (10 mg total) by mouth daily. 30 tablet 6   atorvastatin (LIPITOR) 40 MG tablet Take 1 tablet (40 mg total) by mouth daily. 30 tablet 6   calcitRIOL (ROCALTROL) 0.5 MCG capsule Take 0.5 mcg by mouth daily.     colchicine 0.6 MG tablet Take 2 tabs (1.2 mg) at the onset of a gout flare, may repeat 1 tab (0.6 mg) in 1 hour if symptoms persist 30 tablet 1   doxycycline (VIBRA-TABS) 100 MG tablet Take 1 tablet (100 mg total) by mouth 2 (two) times daily. 20 tablet 0   hydrOXYzine (ATARAX) 25 MG tablet Take 1 tablet (25 mg total) by mouth at bedtime as needed. 30 tablet 1   ipratropium-albuterol (DUONEB) 0.5-2.5 (3) MG/3ML SOLN Inhale 3 mLs into the lungs every 6 (six) hours as needed. 90 mL 0   isosorbide mononitrate (IMDUR) 30 MG 24 hr tablet Take 1 tablet (30 mg total) by mouth daily. 30 tablet 2   labetalol (NORMODYNE) 300 MG tablet Take 1 tablet (300 mg total) by mouth 2 (two) times daily. 180 tablet 1   levothyroxine (SYNTHROID) 88 MCG tablet Take 1 tablet (88 mcg total) by mouth daily before breakfast. 30 tablet  3   mometasone-formoterol (DULERA) 100-5 MCG/ACT AERO Inhale 2 puffs into the lungs 2 (two) times daily. 13 g 6   mupirocin ointment (BACTROBAN) 2 % Apply 1 Application topically 2 (two) times daily. (Patient not taking: Reported on 02/06/2023) 22 g 0   predniSONE (DELTASONE) 20 MG tablet Take 1 tablet (20 mg total) by mouth daily with breakfast. 5 tablet 0   sodium bicarbonate 650 MG tablet Take 1 tablet (650 mg total) by mouth 2 (two) times daily. 60 tablet 0   torsemide (DEMADEX) 100 MG tablet Take 1 tablet (100 mg total) by mouth daily. 30 tablet 0   triamcinolone cream (KENALOG) 0.1 % APPLY  CREAM EXTERNALLY TWICE DAILY 45 g 0   No current facility-administered  medications on file prior to visit.    ROS: See HPI  Observations/Objective: Awake, alert, oriented x3 Not in acute distress Normal mood      Latest Ref Rng & Units 03/12/2023   11:03 AM 02/06/2023   10:31 AM 09/25/2022    6:09 AM  CMP  Glucose 70 - 99 mg/dL 93  95  409   BUN 6 - 20 mg/dL 811  914  782   Creatinine 0.61 - 1.24 mg/dL 95.62  13.08  6.57   Sodium 135 - 145 mmol/L 133  135  137   Potassium 3.5 - 5.1 mmol/L 3.6  3.0  3.6   Chloride 98 - 111 mmol/L 98  98  95   CO2 22 - 32 mmol/L 19  18  25    Calcium 8.9 - 10.3 mg/dL 8.7 - 84.6 mg/dL 6.3    6.4  8.6    8.5  8.5     Lipid Panel     Component Value Date/Time   CHOL 123 05/24/2022 0341   CHOL 214 (H) 11/10/2018 1104   TRIG 44 05/24/2022 0341   HDL 65 05/24/2022 0341   HDL 96 11/10/2018 1104   CHOLHDL 1.9 05/24/2022 0341   VLDL 9 05/24/2022 0341   LDLCALC 49 05/24/2022 0341   LDLCALC 102 (H) 11/10/2018 1104   LABVLDL 16 11/10/2018 1104    Lab Results  Component Value Date   HGBA1C 5.3 02/06/2023    Assessment and Plan: 1. Tinea pedis of both feet Counseled on footcare and ensuring webspaces are dried properly after showers. - Ambulatory referral to Podiatry - terbinafine (LAMISIL AT) 1 % cream; Apply 1 Application topically 2 (two) times daily.  Dispense: 42 g; Refill: 1   Follow Up Instructions: Keep previously scheduled appointment   I discussed the assessment and treatment plan with the patient. The patient was provided an opportunity to ask questions and all were answered. The patient agreed with the plan and demonstrated an understanding of the instructions.   The patient was advised to call back or seek an in-person evaluation if the symptoms worsen or if the condition fails to improve as anticipated.     I provided 11 minutes total of non-face-to-face time during this encounter.   Johnny Register, MD, FAAFP. [facility] [place], [place] [phone]    04/15/2023, 9:15 AM

## 2023-04-16 ENCOUNTER — Encounter (HOSPITAL_COMMUNITY)
Admission: RE | Admit: 2023-04-16 | Discharge: 2023-04-16 | Disposition: A | Discharge status: Home / Self Care | Payer: Medicaid Other | Source: Ambulatory Visit | Attending: Internal Medicine | Admitting: Internal Medicine

## 2023-04-16 VITALS — BP 118/75 | HR 82 | Temp 97.1°F | Resp 17

## 2023-04-16 DIAGNOSIS — N185 Chronic kidney disease, stage 5: Secondary | ICD-10-CM | POA: Diagnosis present

## 2023-04-16 LAB — IRON AND TIBC
Iron: 113 ug/dL (ref 45–182)
Saturation Ratios: 43 % — ABNORMAL HIGH (ref 17.9–39.5)
TIBC: 262 ug/dL (ref 250–450)
UIBC: 149 ug/dL

## 2023-04-16 LAB — RENAL FUNCTION PANEL
Albumin: 3.1 g/dL — ABNORMAL LOW (ref 3.5–5.0)
Anion gap: 13 (ref 5–15)
BUN: 99 mg/dL — ABNORMAL HIGH (ref 6–20)
CO2: 21 mmol/L — ABNORMAL LOW (ref 22–32)
Calcium: 7.7 mg/dL — ABNORMAL LOW (ref 8.9–10.3)
Chloride: 103 mmol/L (ref 98–111)
Creatinine, Ser: 10.25 mg/dL — ABNORMAL HIGH (ref 0.61–1.24)
GFR, Estimated: 5 mL/min — ABNORMAL LOW (ref >60)
Glucose, Bld: 82 mg/dL (ref 70–99)
Phosphorus: 4.4 mg/dL (ref 2.5–4.6)
Potassium: 3.5 mmol/L (ref 3.5–5.1)
Sodium: 137 mmol/L (ref 135–145)

## 2023-04-16 LAB — POCT HEMOGLOBIN-HEMACUE: Hemoglobin: 10.1 g/dL — ABNORMAL LOW (ref 13.0–17.0)

## 2023-04-16 LAB — FERRITIN: Ferritin: 673 ng/mL — ABNORMAL HIGH (ref 24–336)

## 2023-04-16 MED ORDER — EPOETIN ALFA-EPBX 10000 UNIT/ML IJ SOLN
INTRAMUSCULAR | Status: AC
  Filled 2023-04-16: qty 2

## 2023-04-16 MED ORDER — EPOETIN ALFA-EPBX 10000 UNIT/ML IJ SOLN
20000.0000 [IU] | INTRAMUSCULAR | Status: DC
  Administered 2023-04-16: 20000 [IU] via SUBCUTANEOUS

## 2023-04-17 LAB — PTH, INTACT AND CALCIUM
Calcium, Total (PTH): 7.9 mg/dL — ABNORMAL LOW (ref 8.6–10.2)
PTH: 153 pg/mL — ABNORMAL HIGH (ref 15–65)

## 2023-04-19 DIAGNOSIS — N185 Chronic kidney disease, stage 5: Secondary | ICD-10-CM | POA: Diagnosis not present

## 2023-04-19 DIAGNOSIS — N2581 Secondary hyperparathyroidism of renal origin: Secondary | ICD-10-CM | POA: Diagnosis not present

## 2023-04-19 DIAGNOSIS — D631 Anemia in chronic kidney disease: Secondary | ICD-10-CM | POA: Diagnosis not present

## 2023-04-19 DIAGNOSIS — I120 Hypertensive chronic kidney disease with stage 5 chronic kidney disease or end stage renal disease: Secondary | ICD-10-CM | POA: Diagnosis not present

## 2023-04-19 DIAGNOSIS — J449 Chronic obstructive pulmonary disease, unspecified: Secondary | ICD-10-CM | POA: Diagnosis not present

## 2023-04-19 DIAGNOSIS — M109 Gout, unspecified: Secondary | ICD-10-CM | POA: Diagnosis not present

## 2023-04-22 ENCOUNTER — Ambulatory Visit: Payer: Self-pay

## 2023-04-22 ENCOUNTER — Other Ambulatory Visit: Payer: Self-pay | Admitting: Family Medicine

## 2023-04-22 LAB — LAB REPORT - SCANNED: EGFR: 5

## 2023-04-22 NOTE — Telephone Encounter (Signed)
  Chief Complaint: gout  Symptoms: L ankle pain 6/10, swelling and sore to touch, limping d/t gout flare up Frequency: since Last Thursday Pertinent Negatives: NA Disposition: [] ED /[] Urgent Care (no appt availability in office) / [] Appointment(In office/virtual)/ []  [facility]/ [] Home Care/ [x] Refused Recommended Disposition /[]  Mobile Bus/ []  Follow-up with PCP Additional Notes: pt is requesting refill on prednisone for gout flare up. States he never really got over last flare up that pain has been present just tolerable and now getting worse again. No appts until 05/16/23 for OV or VV. Offered to schedule UC appt but pt states he has no transportation. Advised pt I would send message to provider for recommendations.   Reason for Disposition  [1] MODERATE pain (e.g., interferes with normal activities, limping) AND [2] present > 3 days  Answer Assessment - Initial Assessment Questions 1. ONSET: "When did the pain start?"      Last Thursday  2. LOCATION: "Where is the pain located?"      L ankle  3. PAIN: "How bad is the pain?"    (Scale 1-10; or mild, moderate, severe)  - MILD (1-3): doesn't interfere with normal activities.   - MODERATE (4-7): interferes with normal activities (e.g., work or school) or awakens from sleep, limping.   - SEVERE (8-10): excruciating pain, unable to do any normal activities, unable to walk.      6/10 5. CAUSE: "What do you think is causing the foot pain?"     Gout flare up  6. OTHER SYMPTOMS: "Do you have any other symptoms?" (e.g., leg pain, rash, fever, numbness)     Limping, sore to touch, swelling  Protocols used: Foot Pain-A-AH

## 2023-04-22 NOTE — Telephone Encounter (Signed)
Pt said he was in pain and would like to have the rx sent asap.

## 2023-04-23 ENCOUNTER — Encounter (HOSPITAL_COMMUNITY): Payer: Medicaid Other

## 2023-04-23 NOTE — Telephone Encounter (Signed)
Pt is having a gout flare.

## 2023-04-24 ENCOUNTER — Telehealth: Payer: Self-pay

## 2023-04-24 NOTE — Telephone Encounter (Signed)
Copied from CRM [id number]. Topic: Referral - Request for Referral >> Apr 24, 2023 10:28 AM Franchot Heidelberg wrote: Has patient seen PCP for this complaint? No. *If NO, is insurance requiring patient see PCP for this issue before PCP can refer them? Referral for which specialty: Dietician  Preferred provider/office: [facility]  Reason for referral: Seeking diet advice

## 2023-04-24 NOTE — Telephone Encounter (Signed)
Attempted to reach patient and message states that VM is currently full.

## 2023-04-30 ENCOUNTER — Ambulatory Visit (HOSPITAL_COMMUNITY)
Admission: RE | Admit: 2023-04-30 | Discharge: 2023-04-30 | Disposition: A | Discharge status: Home / Self Care | Payer: Medicaid Other | Source: Ambulatory Visit | Attending: Internal Medicine | Admitting: Internal Medicine

## 2023-04-30 VITALS — BP 125/80 | HR 71 | Temp 97.3°F | Resp 17

## 2023-04-30 DIAGNOSIS — N185 Chronic kidney disease, stage 5: Secondary | ICD-10-CM | POA: Insufficient documentation

## 2023-04-30 LAB — POCT HEMOGLOBIN-HEMACUE: Hemoglobin: 9.6 g/dL — ABNORMAL LOW (ref 13.0–17.0)

## 2023-04-30 MED ORDER — EPOETIN ALFA-EPBX 10000 UNIT/ML IJ SOLN
20000.0000 [IU] | INTRAMUSCULAR | Status: DC
  Administered 2023-04-30: 20000 [IU] via SUBCUTANEOUS

## 2023-04-30 MED ORDER — EPOETIN ALFA-EPBX 10000 UNIT/ML IJ SOLN
INTRAMUSCULAR | Status: AC
  Filled 2023-04-30: qty 2

## 2023-05-03 ENCOUNTER — Ambulatory Visit: Payer: Medicaid Other | Admitting: Podiatry

## 2023-05-14 ENCOUNTER — Encounter (HOSPITAL_COMMUNITY): Payer: Medicaid Other

## 2023-05-15 ENCOUNTER — Encounter: Payer: Self-pay | Admitting: Gastroenterology

## 2023-05-15 ENCOUNTER — Ambulatory Visit (INDEPENDENT_AMBULATORY_CARE_PROVIDER_SITE_OTHER): Payer: Medicaid Other | Admitting: Gastroenterology

## 2023-05-15 VITALS — BP 120/60 | HR 80 | Ht 69.75 in | Wt 184.4 lb

## 2023-05-15 DIAGNOSIS — Z86010 Personal history of colonic polyps: Secondary | ICD-10-CM

## 2023-05-15 MED ORDER — PEG 3350-KCL-NA BICARB-NACL 420 G PO SOLR: 4000.0000 mL | Freq: Once | ORAL | 0 refills | Status: AC

## 2023-05-15 NOTE — Progress Notes (Deleted)
[facility] Consult Note:  History: Johnny [name] 05/15/2023  Referring provider: Hoy Register, MD  Reason for consult/chief complaint: No chief complaint on file.   Subjective  HPI: Last colonoscopy for screening with Dr. Arlyce Dice November 2015-2 diminutive tubular adenomas removed. Recently referred back to [place] by primary care for colon polyp surveillance.  Chart review indicated patient with advanced CKD probably nearing dialysis.  Recent [facility] renal transplant clinic phone note suggest this patient might not currently be a candidate for transplant consideration due to results of disease, his functional status and social support.  ***   ROS:  Review of Systems   Past Medical History: Past Medical History:  Diagnosis Date   Adenomatous colon polyp    tubular   CHF (congestive heart failure) (HCC)    Chronic kidney disease    COPD (chronic obstructive pulmonary disease) (HCC)    Diverticulosis    External otitis of right ear 11/10/2018   Hepatitis C    Hypertension    OSA (obstructive sleep apnea) 11/03/2015   Strep throat 01/2021     Past Surgical History: Past Surgical History:  Procedure Laterality Date   AV FISTULA PLACEMENT  09/12/2009   Left arm AVF     Family History: Family History  Problem Relation Age of Onset   Hypertension Mother    Diabetes Mother    Colon cancer Neg Hx     Social History: Social History   Socioeconomic History   Marital status: Divorced    Spouse name: Not on file   Number of children: Not on file   Years of education: 14   Highest education level: Not on file  Occupational History   Occupation: Archivist  Tobacco Use   Smoking status: Former    Packs/day: .1    Types: Cigarettes    Quit date: 04/02/2011    Years since quitting: 12.1   Smokeless tobacco: Never   Tobacco comments:    trying to quit  Vaping Use   Vaping Use: Never used  Substance and Sexual Activity    Alcohol use: Never    Alcohol/week: 0.0 standard drinks of alcohol   Drug use: No   Sexual activity: Not Currently  Other Topics Concern   Not on file  Social History Narrative   Lives at home alone.   Right-handed.    Occasional caffeine use.   Social Determinants of Health   Financial Resource Strain: Not on file  Food Insecurity: Food Insecurity Present (06/13/2022)   Hunger Vital Sign    Worried About Running Out of Food in the Last Year: Sometimes true    Ran Out of Food in the Last Year: Sometimes true  Transportation Needs: Unmet Transportation Needs (06/13/2022)   PRAPARE - Administrator, Civil Service (Medical): Yes    Lack of Transportation (Non-Medical): Yes  Physical Activity: Not on file  Stress: Not on file  Social Connections: Not on file    Allergies: Allergies  Allergen Reactions   Naproxen Other (See Comments)    Unknown reaction    Shellfish Allergy Itching    Outpatient Meds: Current Outpatient Medications  Medication Sig Dispense Refill   acetaminophen (TYLENOL) 325 MG tablet Take 2 tablets (650 mg total) by mouth every 6 (six) hours as needed for moderate pain or mild pain. 30 tablet 1   albuterol (VENTOLIN HFA) 108 (90 Base) MCG/ACT inhaler Inhale 2 puffs into the lungs every 6 (six) hours as needed  for wheezing or shortness of breath. 18 g 2   allopurinol (ZYLOPRIM) 100 MG tablet Take 1 tablet (100 mg total) by mouth daily. 90 tablet 1   amLODipine (NORVASC) 10 MG tablet Take 1 tablet (10 mg total) by mouth daily. 30 tablet 6   atorvastatin (LIPITOR) 40 MG tablet Take 1 tablet (40 mg total) by mouth daily. 30 tablet 6   calcitRIOL (ROCALTROL) 0.5 MCG capsule Take 0.5 mcg by mouth daily.     colchicine 0.6 MG tablet Take 2 tabs (1.2 mg) at the onset of a gout flare, may repeat 1 tab (0.6 mg) in 1 hour if symptoms persist 30 tablet 1   doxycycline (VIBRA-TABS) 100 MG tablet Take 1 tablet (100 mg total) by mouth 2 (two) times daily. 20 tablet  0   hydrOXYzine (ATARAX) 25 MG tablet Take 1 tablet (25 mg total) by mouth at bedtime as needed. 30 tablet 1   ipratropium-albuterol (DUONEB) 0.5-2.5 (3) MG/3ML SOLN Inhale 3 mLs into the lungs every 6 (six) hours as needed. 90 mL 0   isosorbide mononitrate (IMDUR) 30 MG 24 hr tablet Take 1 tablet (30 mg total) by mouth daily. 30 tablet 2   labetalol (NORMODYNE) 300 MG tablet Take 1 tablet (300 mg total) by mouth 2 (two) times daily. 180 tablet 1   levothyroxine (SYNTHROID) 88 MCG tablet Take 1 tablet (88 mcg total) by mouth daily before breakfast. 30 tablet 3   mometasone-formoterol (DULERA) 100-5 MCG/ACT AERO Inhale 2 puffs into the lungs 2 (two) times daily. 13 g 6   mupirocin ointment (BACTROBAN) 2 % Apply 1 Application topically 2 (two) times daily. (Patient not taking: Reported on 02/06/2023) 22 g 0   predniSONE (DELTASONE) 20 MG tablet Take 1 tablet by mouth once daily with breakfast 5 tablet 0   sodium bicarbonate 650 MG tablet Take 1 tablet (650 mg total) by mouth 2 (two) times daily. 60 tablet 0   terbinafine (LAMISIL AT) 1 % cream Apply 1 Application topically 2 (two) times daily. 42 g 1   torsemide (DEMADEX) 100 MG tablet Take 1 tablet (100 mg total) by mouth daily. 30 tablet 0   triamcinolone cream (KENALOG) 0.1 % APPLY  CREAM EXTERNALLY TWICE DAILY 45 g 0   No current facility-administered medications for this visit.      ___________________________________________________________________ Objective   Exam:  There were no vitals taken for this visit. Wt Readings from Last 3 Encounters:  02/06/23 189 lb 6.4 oz (85.9 kg)  12/27/22 194 lb (88 kg)  11/07/22 194 lb 9.6 oz (88.3 kg)    General: ***  Eyes: sclera anicteric, no redness ENT: oral mucosa moist without lesions, no cervical or supraclavicular lymphadenopathy CV: ***, no JVD, no peripheral edema Resp: clear to auscultation bilaterally, normal RR and effort noted GI: soft, *** tenderness, with active bowel sounds. No  guarding or palpable organomegaly noted. Skin; warm and dry, no rash or jaundice noted Neuro: awake, alert and oriented x 3. Normal gross motor function and fluent speech  Labs:     Latest Ref Rng & Units 04/30/2023    8:29 AM 04/16/2023    9:04 AM 03/26/2023   10:54 AM  CBC  Hemoglobin 13.0 - 17.0 g/dL 9.6  16.1  09.6    Last full CBC in this EMR November 2023, normal platelet count     Latest Ref Rng & Units 04/16/2023    9:01 AM 03/12/2023   11:03 AM 02/06/2023   10:31 AM  CMP  Glucose 70 - 99 mg/dL 82  93  95   BUN 6 - 20 mg/dL 99  161  096   Creatinine 0.61 - 1.24 mg/dL 04.54  09.81  19.14   Sodium 135 - 145 mmol/L 137  133  135   Potassium 3.5 - 5.1 mmol/L 3.5  3.6  3.0   Chloride 98 - 111 mmol/L 103  98  98   CO2 22 - 32 mmol/L 21  19  18    Calcium 8.9 - 10.3 mg/dL 8.6 - 78.2 mg/dL 7.7    7.9  6.3    6.4  8.6    8.5    Also recently received EPO injections  Radiologic Studies:  ***  Assessment: No diagnosis found.  ***  Plan:  ***  Thank you for the courtesy of this consult.  Please call me with any questions or concerns.  Charlie Pitter III  CC: Referring provider noted above

## 2023-05-15 NOTE — Progress Notes (Signed)
[facility] Consult Note:  History: Johnny [name] 05/15/2023  Referring provider: Hoy Register, MD  Reason for consult/chief complaint: kidney transplant eval (To discuss having colonoscopy, hx of colon polyps/)   Subjective  HPI: Last colonoscopy for screening with Dr. Arlyce Dice November 2015-2 diminutive tubular adenomas removed. Recently referred back to [place] by primary care for colon polyp surveillance.  Chart review indicated patient with advanced CKD probably nearing dialysis.  Recent [facility] renal transplant clinic phone note suggest this patient might not currently be a candidate for transplant consideration due to results of disease, his functional status and social support.  Today, he reports feeling well overall. Currently no GI complains.   We reviewed and discussed his diagnosis of CKD. He is currently not on dialysis. He states that he is currently not a candidate for kidney transplant.  He reports having a colonoscopy in 2015. We discussed his last lab work and discussed the risks and benefits of preforming a colonoscopy due to his advanced CKD.   he denies diarrhea, constipation, nausea, blood in stool, black stool, vomiting, abdominal pain, bloating, unintentional weight loss, reflux, dysphagia.  ROS:  Review of Systems  Constitutional:  Negative for appetite change and fever.  HENT:  Negative for trouble swallowing.   Respiratory:  Negative for cough and shortness of breath.   Cardiovascular:  Negative for chest pain.  Gastrointestinal:  Negative for abdominal distention, abdominal pain, anal bleeding, blood in stool, constipation, diarrhea, nausea, rectal pain and vomiting.  Genitourinary:  Negative for dysuria.  Musculoskeletal:  Negative for back pain.  Skin:  Negative for rash.  Neurological:  Negative for weakness.  All other systems reviewed and are negative.  Devarion has good functional status, denies chest pain or dyspnea.  He  also makes urine.  Past Medical History: Past Medical History:  Diagnosis Date   Adenomatous colon polyp    tubular   CHF (congestive heart failure) (HCC)    Chronic kidney disease    COPD (chronic obstructive pulmonary disease) (HCC)    Diverticulosis    External otitis of right ear 11/10/2018   Hepatitis C    Hypertension    OSA (obstructive sleep apnea) 11/03/2015   Strep throat 01/2021     Past Surgical History: Past Surgical History:  Procedure Laterality Date   AV FISTULA PLACEMENT  09/12/2009   Left arm AVF   Fistula placed in 2010 as dialysis was expected to be imminent at that time, but he has never needed it.  Family History: Family History  Problem Relation Age of Onset   Hypertension Mother    Diabetes Mother    Colon cancer Neg Hx     Social History: Social History   Socioeconomic History   Marital status: Divorced    Spouse name: Not on file   Number of children: Not on file   Years of education: 14   Highest education level: Not on file  Occupational History   Occupation: Archivist  Tobacco Use   Smoking status: Former    Packs/day: .1    Types: Cigarettes    Quit date: 04/02/2011    Years since quitting: 12.1   Smokeless tobacco: Never   Tobacco comments:    trying to quit  Vaping Use   Vaping Use: Never used  Substance and Sexual Activity   Alcohol use: Never    Alcohol/week: 0.0 standard drinks of alcohol   Drug use: No   Sexual activity: Not Currently  Other  Topics Concern   Not on file  Social History Narrative   Lives at home alone.   Right-handed.    Occasional caffeine use.   Social Determinants of Health   Financial Resource Strain: Not on file  Food Insecurity: Food Insecurity Present (06/13/2022)   Hunger Vital Sign    Worried About Running Out of Food in the Last Year: Sometimes true    Ran Out of Food in the Last Year: Sometimes true  Transportation Needs: Unmet Transportation Needs (06/13/2022)   PRAPARE -  Administrator, Civil Service (Medical): Yes    Lack of Transportation (Non-Medical): Yes  Physical Activity: Not on file  Stress: Not on file  Social Connections: Not on file   Currently 2 semesters away from finishing a degree in addiction counseling  Allergies: Allergies  Allergen Reactions   Naproxen Other (See Comments)    Unknown reaction    Shellfish Allergy Itching    Outpatient Meds: Current Outpatient Medications  Medication Sig Dispense Refill   albuterol (VENTOLIN HFA) 108 (90 Base) MCG/ACT inhaler Inhale 2 puffs into the lungs every 6 (six) hours as needed for wheezing or shortness of breath. 18 g 2   allopurinol (ZYLOPRIM) 100 MG tablet Take 1 tablet (100 mg total) by mouth daily. 90 tablet 1   amLODipine (NORVASC) 10 MG tablet Take 1 tablet (10 mg total) by mouth daily. 30 tablet 6   atorvastatin (LIPITOR) 40 MG tablet Take 1 tablet (40 mg total) by mouth daily. 30 tablet 6   colchicine 0.6 MG tablet Take 2 tabs (1.2 mg) at the onset of a gout flare, may repeat 1 tab (0.6 mg) in 1 hour if symptoms persist 30 tablet 1   ipratropium-albuterol (DUONEB) 0.5-2.5 (3) MG/3ML SOLN Inhale 3 mLs into the lungs every 6 (six) hours as needed. 90 mL 0   labetalol (NORMODYNE) 300 MG tablet Take 1 tablet (300 mg total) by mouth 2 (two) times daily. 180 tablet 1   levothyroxine (SYNTHROID) 88 MCG tablet Take 1 tablet (88 mcg total) by mouth daily before breakfast. 30 tablet 3   mometasone-formoterol (DULERA) 100-5 MCG/ACT AERO Inhale 2 puffs into the lungs 2 (two) times daily. 13 g 6   mupirocin ointment (BACTROBAN) 2 % Apply 1 Application topically 2 (two) times daily. 22 g 0   predniSONE (DELTASONE) 20 MG tablet Take 1 tablet by mouth once daily with breakfast 5 tablet 0   terbinafine (LAMISIL AT) 1 % cream Apply 1 Application topically 2 (two) times daily. 42 g 1   torsemide (DEMADEX) 100 MG tablet Take 1 tablet (100 mg total) by mouth daily. 30 tablet 0   triamcinolone  cream (KENALOG) 0.1 % APPLY  CREAM EXTERNALLY TWICE DAILY 45 g 0   hydrOXYzine (ATARAX) 25 MG tablet Take 1 tablet (25 mg total) by mouth at bedtime as needed. (Patient not taking: Reported on 05/15/2023) 30 tablet 1   No current facility-administered medications for this visit.      ___________________________________________________________________ Objective   Exam:  Ht 5' 9.75" (1.772 m) Comment: height measured without shoes  Wt 184 lb 6 oz (83.6 kg)   BMI 26.65 kg/m  Wt Readings from Last 3 Encounters:  05/15/23 184 lb 6 oz (83.6 kg)  02/06/23 189 lb 6.4 oz (85.9 kg)  12/27/22 194 lb (88 kg)   General: well-appearing   Eyes: sclera anicteric, no redness ENT: oral mucosa moist without lesions, no cervical or supraclavicular lymphadenopathy CV: RRR, no JVD,  no peripheral edema.  Left upper arm AV fistula Resp: clear to auscultation bilaterally, normal RR and effort noted GI: soft, no tenderness, with active bowel sounds. No guarding or palpable organomegaly noted. Skin; warm and dry, no rash or jaundice noted, fistula on upper arm Neuro: awake, alert and oriented x 3. Normal gross motor function and fluent speech   Labs:     Latest Ref Rng & Units 04/30/2023    8:29 AM 04/16/2023    9:04 AM 03/26/2023   10:54 AM  CBC  Hemoglobin 13.0 - 17.0 g/dL 9.6  16.1  09.6    Last full CBC in this EMR November 2023, normal platelet count     Latest Ref Rng & Units 04/16/2023    9:01 AM 03/12/2023   11:03 AM 02/06/2023   10:31 AM  CMP  Glucose 70 - 99 mg/dL 82  93  95   BUN 6 - 20 mg/dL 99  045  409   Creatinine 0.61 - 1.24 mg/dL 81.19  14.78  29.56   Sodium 135 - 145 mmol/L 137  133  135   Potassium 3.5 - 5.1 mmol/L 3.5  3.6  3.0   Chloride 98 - 111 mmol/L 103  98  98   CO2 22 - 32 mmol/L 21  19  18    Calcium 8.9 - 10.3 mg/dL 8.6 - 21.3 mg/dL 7.7    7.9  6.3    6.4  8.6    8.5    Also recently received EPO injections  Radiologic Studies: ECHO 02-10-21 1. Left  ventricular ejection fraction, by estimation, is 55 to 60%. The  left ventricle has normal function. The left ventricle has no regional  wall motion abnormalities. There is moderate asymmetric left ventricular  hypertrophy. Left ventricular  diastolic parameters are consistent with Grade III diastolic dysfunction  (restrictive).   2. Right ventricular systolic function is normal. The right ventricular  size is normal.   3. Left atrial size was moderately dilated.   4. Right atrial size was mild to moderately dilated.   5. The mitral valve is normal in structure. Mild mitral valve  regurgitation.   6. The aortic valve is tricuspid. Aortic valve regurgitation is not  visualized. No aortic stenosis is present.   7. The inferior vena cava is dilated in size with >50% respiratory  variability, suggesting right atrial pressure of 8 mmHg.   Assessment: History of colon polyps  Overdue for surveillance colonoscopy.  Despite his advanced stage of kidney disease, he looks well and has good functional status.  He is still at increased risk for complications of sedation and colonoscopy due to his underlying kidney disease.  He may also be at increased bleeding risk from polypectomy with a markedly elevated BUN. I offered him a colonoscopy, and it must be done in the hospital outpatient endoscopy lab due to his condition.  He was agreeable, and fortunately we had a slot open just 2 weeks from now.  We will use a GoLytely bowel preparation to minimize the chance of volume/electrolyte shifts due to his CKD.  In addition, I will send this note to both his primary care provider but also his nephrologist, Dr. Brunilda Payor for advice on whether DDAVP would be warranted and how to manage that the day of her procedure.  Plan: He was agreeable to colonoscopy after discussion of the logistics, preparation, risks and benefits  The benefits and risks of the planned procedure were described in detail with the  patient or (when appropriate) their health care proxy.  Risks were outlined as including, but not limited to, bleeding, infection, perforation, adverse medication reaction leading to cardiac or pulmonary decompensation, pancreatitis (if ERCP).  The limitation of incomplete mucosal visualization was also discussed.  No guarantees or warranties were given.    Amada Jupiter, MD    [facility]    Thank you for the courtesy of this consult.  Please call me with any questions or concerns.   I,Safa M Kadhim,acting as a scribe for Charlie Pitter III, MD.,have documented all relevant documentation on the behalf of Sherrilyn Rist, MD,as directed by  Sherrilyn Rist, MD while in the presence of Sherrilyn Rist, MD.   Marvis Repress III, MD, have reviewed all documentation for this visit. The documentation on 05/15/23 for the exam, diagnosis, procedures, and orders are all accurate and complete.    CC: Referring provider noted above

## 2023-05-15 NOTE — Patient Instructions (Addendum)
_______________________________________________________  If your blood pressure at your visit was 140/90 or greater, please contact your primary care physician to follow up on this.  _______________________________________________________  If you are age 60 or older, your body mass index should be between 23-30. Your Body mass index is 26.65 kg/m. If this is out of the aforementioned range listed, please consider follow up with your Primary Care Provider.  If you are age 54 or younger, your body mass index should be between 19-25. Your Body mass index is 26.65 kg/m. If this is out of the aformentioned range listed, please consider follow up with your Primary Care Provider.   ________________________________________________________  The [facility] providers would like to encourage you to use [facility] to communicate with providers for non-urgent requests or questions.  Due to long hold times on the telephone, sending your provider a message by [facility] may be a faster and more efficient way to get a response.  Please allow 48 business hours for a response.  Please remember that this is for non-urgent requests.  _______________________________________________________  Bonita Quin have been scheduled for a colonoscopy. Please follow written instructions given to you at your visit today.   Please pick up your prep supplies at the pharmacy within the next 1-3 days.  If you use inhalers (even only as needed), please bring them with you on the day of your procedure.  DO NOT TAKE 7 DAYS PRIOR TO TEST- Trulicity (dulaglutide) Ozempic, Wegovy (semaglutide) Mounjaro (tirzepatide) Bydureon Bcise (exanatide extended release)  DO NOT TAKE 1 DAY PRIOR TO YOUR TEST Rybelsus (semaglutide) Adlyxin (lixisenatide) Victoza (liraglutide) Byetta (exanatide) ___________________________________________________________________________  It was a pleasure to see you today!  Thank you for trusting me with your  gastrointestinal care!

## 2023-05-17 ENCOUNTER — Encounter (HOSPITAL_COMMUNITY): Payer: Self-pay | Admitting: Gastroenterology

## 2023-05-17 NOTE — Progress Notes (Signed)
Attempted to obtain medical history via telephone, unable to reach at this time. HIPAA compliant voicemail message left requesting return call to pre surgical testing department. 

## 2023-05-21 ENCOUNTER — Telehealth: Payer: Self-pay | Admitting: Gastroenterology

## 2023-05-21 ENCOUNTER — Telehealth: Payer: Self-pay | Admitting: Family Medicine

## 2023-05-21 ENCOUNTER — Ambulatory Visit: Payer: Self-pay | Admitting: *Deleted

## 2023-05-21 ENCOUNTER — Other Ambulatory Visit: Payer: Self-pay | Admitting: Family Medicine

## 2023-05-21 MED ORDER — COLCHICINE 0.6 MG PO TABS: ORAL_TABLET | ORAL | 1 refills | Status: DC

## 2023-05-21 NOTE — Telephone Encounter (Signed)
I have sent prescription for colchicine to requested pharmacy.

## 2023-05-21 NOTE — Telephone Encounter (Signed)
Copied from CRM [id number]. Topic: General - Other >> May 21, 2023  9:17 AM Franchot Heidelberg wrote: Reason for CRM: Pt returned call for update on refill, called in request an hour ago.  Wants gout flare up medication sent to [facility] [address]   Best contact: [phone]

## 2023-05-21 NOTE — Telephone Encounter (Signed)
Duplicate message. 

## 2023-05-21 NOTE — Telephone Encounter (Signed)
Pt states that he is having a gout flare.

## 2023-05-21 NOTE — Addendum Note (Signed)
Addended by: Hoy Register on: 05/21/2023 05:16 PM   Modules accepted: Orders

## 2023-05-21 NOTE — Telephone Encounter (Signed)
Reason for Disposition  [1] MODERATE leg swelling (e.g., swelling extends up to knees) AND [2] new-onset or worsening    Gout flare up    Requesting prednisone be called in.  Answer Assessment - Initial Assessment Questions 1. ONSET: "When did the swelling start?" (e.g., minutes, hours, days)     Right leg started swelling Friday.   It's getting worse.    I thought it would get better but it's getting worse.   I have gout. 2. LOCATION: "What part of the leg is swollen?"  "Are both legs swollen or just one leg?"    The swelling started in my left knee and went to left ankle.   Now it's in my right ankle.   My right ankle is so painful I can't walk on it. 3. SEVERITY: "How bad is the swelling?" (e.g., localized; mild, moderate, severe)   - Localized: Small area of swelling localized to one leg.   - MILD pedal edema: Swelling limited to foot and ankle, pitting edema < 1/4 inch (6 mm) deep, rest and elevation eliminate most or all swelling.   - MODERATE edema: Swelling of lower leg to knee, pitting edema > 1/4 inch (6 mm) deep, rest and elevation only partially reduce swelling.   - SEVERE edema: Swelling extends above knee, facial or hand swelling present.      My left knee is swollen like  a grapefruit.        I don't have any prednisone but that's what I usually take.     4. REDNESS: "Does the swelling look red or infected?"     I'm using hot and cold compresses for the pain.   I'm mostly using heating pads.   5. PAIN: "Is the swelling painful to touch?" If Yes, ask: "How painful is it?"   (Scale 1-10; mild, moderate or severe)     It hurts to walk.   I've been in the bed for 4 days.   6. FEVER: "Do you have a fever?" If Yes, ask: "What is it, how was it measured, and when did it start?"      Not asked 7. CAUSE: "What do you think is causing the leg swelling?"     gout 8. MEDICAL HISTORY: "Do you have a history of blood clots (e.g., DVT), cancer, heart failure, kidney disease, or liver  failure?"     Not asked 9. RECURRENT SYMPTOM: "Have you had leg swelling before?" If Yes, ask: "When was the last time?" "What happened that time?"     Yes with gout 10. OTHER SYMPTOMS: "Do you have any other symptoms?" (e.g., chest pain, difficulty breathing)       No 11. PREGNANCY: "Is there any chance you are pregnant?" "When was your last menstrual period?"       N/A  Protocols used: Leg Swelling and Edema-A-AH

## 2023-05-21 NOTE — Telephone Encounter (Signed)
[name], when you are confirming my Johnny [name] long cases for 05/28/2023, this patient needs an additional order placed for some medicine to be administered the day of his procedure.   Because of his chronic kidney disease, he needs an infusion of DDAVP to aid with platelet function and decrease the chance of bleeding if any polyps are removed.  I checked with his nephrologist, and the dose would be 25 micrograms given 30 to 60 minutes before his procedure.  Please check with nurse management in the [facility] end[facility] lab to see if they need those orders placed ahead of time so the medicine can be obtained from the pharmacy that morning.  However, it is important that we give it in the right timeframe that morning.    Thanks   -HD

## 2023-05-21 NOTE — Telephone Encounter (Signed)
Pt is calling in to check the status of his refill predniSONE (DELTASONE) 20 MG tablet [[id number]] . Pt says he has reached out to the pharmacy and they don't have the refill. Pt states he is having a gout flare up and needs the medication sent to [facility].

## 2023-05-21 NOTE — Telephone Encounter (Signed)
  Chief Complaint: gout flare up in left knee and ankle and also in right ankle with swelling in a 3 places.  Requesting prednisone be called in.    "I can't walk or get out so someone will go get the prednisone for me if Dr Alvis Lemmings will call it in for me".    "She's called it in before".Sin Symptoms: Swelling and pain in above listed joints. Frequency: Since last Friday Pertinent Negatives: Patient denies being able to walk due to ankle pain Disposition: [] ED /[] Urgent Care (no appt availability in office) / [] Appointment(In office/virtual)/ []  [place] [facility]/ [] Home Care/ [] Refused Recommended Disposition /[] [place] [facility]/ [x]  Follow-up with PCP Additional Notes: Message sent to Dr. Alvis Lemmings with pt's request for prednisone be sent to [facility].   "She usually calls it in for me".    "I can't get out".

## 2023-05-22 ENCOUNTER — Other Ambulatory Visit: Payer: Self-pay

## 2023-05-22 ENCOUNTER — Encounter (HOSPITAL_COMMUNITY): Payer: Medicaid Other

## 2023-05-22 DIAGNOSIS — N185 Chronic kidney disease, stage 5: Secondary | ICD-10-CM

## 2023-05-22 DIAGNOSIS — Z86010 Personal history of colonic polyps: Secondary | ICD-10-CM

## 2023-05-22 NOTE — Telephone Encounter (Signed)
Pt has been informed of medication being sent. 

## 2023-05-22 NOTE — Telephone Encounter (Signed)
DDVAP infusion order in epic - 25 micrograms given 30 to 60 minutes before his procedure.   Called [facility] endo and spoke with Hughie Closs, Charity fundraiser. Just wanted to confirm that DDVAP order is visible to them in Epic and that they will have medication prior to patient's appt. Rolly Salter, RN will call back to confirm.

## 2023-05-22 NOTE — Telephone Encounter (Signed)
Rolly Salter, RN at [facility] endo confirmed that order is visible. They will have patient come in earlier for his procedure so that order can be released and received from pharmacy.

## 2023-05-22 NOTE — Telephone Encounter (Signed)
Requested medication (s) are due for refill today: -  Requested medication (s) are on the active medication list:yes  Last refill:  04/23/23 #5   Future visit scheduled: no  Notes to clinic:  med not delegated to NT to RF or refuse   Requested Prescriptions  Pending Prescriptions Disp Refills   predniSONE (DELTASONE) 20 MG tablet 5 tablet 0    Sig: Take 1 tablet (20 mg total) by mouth daily with breakfast.     Not Delegated - Endocrinology:  Oral Corticosteroids Failed - 05/22/2023  8:15 AM      Failed - This refill cannot be delegated      Failed - Manual Review: Eye exam for IOP if prolonged treatment      Failed - Bone Mineral Density or Dexa Scan completed in the last 2 years      Passed - Glucose (serum) in normal range and within 180 days    Glucose, Bld  Date Value Ref Range Status  04/16/2023 82 70 - 99 mg/dL Final    Comment:    Glucose reference range applies only to samples taken after fasting for at least 8 hours.         Passed - K in normal range and within 180 days    Potassium  Date Value Ref Range Status  04/16/2023 3.5 3.5 - 5.1 mmol/L Final         Passed - Na in normal range and within 180 days    Sodium  Date Value Ref Range Status  04/16/2023 137 135 - 145 mmol/L Final  11/08/2020 135 134 - 144 mmol/L Final         Passed - Last BP in normal range    BP Readings from Last 1 Encounters:  05/15/23 120/60         Passed - Valid encounter within last 6 months    Recent Outpatient Visits           1 month ago Tinea pedis of both feet   [facility] [place], Odette Horns, MD   3 months ago Screening for diabetes mellitus   [facility] Hoy Register, MD   5 months ago Attention deficit   [facility] [place], Odette Horns, MD   6 months ago CKD (chronic kidney disease) stage 5, GFR less than 15 ml/min [facility])   [place] [facility] [place], [place], [place]   11 months ago Chronic kidney disease (CKD), stage V [facility])   [facility] Hoy Register, MD

## 2023-05-23 ENCOUNTER — Ambulatory Visit: Payer: Self-pay | Admitting: *Deleted

## 2023-05-23 NOTE — Telephone Encounter (Signed)
Summary: vomiting no appetite   Pt called says is vomiting and hasn't eating in 4 days       Reason for Disposition  [1] MILD or MODERATE vomiting AND [2] present > 48 hours (2 days) (Exception: Mild vomiting with associated diarrhea.)  Answer Assessment - Initial Assessment Questions 1. VOMITING SEVERITY: "How many times have you vomited in the past 24 hours?"     - MILD:  1 - 2 times/day    - MODERATE: 3 - 5 times/day, decreased oral intake without significant weight loss or symptoms of dehydration    - SEVERE: 6 or more times/day, vomits everything or nearly everything, with significant weight loss, symptoms of dehydration      Mild- not eating 2. ONSET: "When did the vomiting begin?"      Last night 3. FLUIDS: "What fluids or food have you vomited up today?" "Have you been able to keep any fluids down?"     Can keep water down, some applesauce- but not keeping medication down 4. ABDOMEN PAIN: "Are your having any abdomen pain?" If Yes : "How bad is it and what does it feel like?" (e.g., crampy, dull, intermittent, constant)      no 5. DIARRHEA: "Is there any diarrhea?" If Yes, ask: "How many times today?"      Yes- nothing but liquids for 4 days 6. CONTACTS: "Is there anyone else in the family with the same symptoms?"       no 7. CAUSE: "What do you think is causing your vomiting?"     Gout pain- no appetite  8. HYDRATION STATUS: "Any signs of dehydration?" (e.g., dry mouth [not only dry lips], too weak to stand) "When did you last urinate?"     Slight, just urinated- not clear- not yellow 9. OTHER SYMPTOMS: "Do you have any other symptoms?" (e.g., fever, headache, vertigo, vomiting blood or coffee grounds, recent head injury)     no  Protocols used: Vomiting-A-AH

## 2023-05-23 NOTE — Telephone Encounter (Signed)
noted 

## 2023-05-23 NOTE — Telephone Encounter (Signed)
  Chief Complaint:  no appetite, vomiting Symptoms: patient reports recent gout flare- he has been having pain and has not ben hungry- he states he is drink water and eating applesauce. Patient reports she tried to increase on appetite today- but vomited. Patient is concerned about not being able to keep medications down- although he states he has taken his colchicine on hourly basis. Patient advised to try to increase liquids and build on diet- if unable to do so- he is at risk for dehydration and will need to be seen. Offered appointment in office today- but patient declined due to foot pain- he has to walk.  Frequency: vomiting today Pertinent Negatives: Patient denies abdominal pain, fever, headache, vertigo, vomiting blood or coffee grounds, recent head injury  Disposition: [] ED /[] Urgent Care (no appt availability in office) / [] Appointment(In office/virtual)/ []  [place] [facility]/ [] Home Care/ [x] Refused Recommended Disposition /[] [place] [facility]/ []  Follow-up with PCP Additional Notes: Offered appointment today- patient declined- advised ED if he gets worse

## 2023-05-24 ENCOUNTER — Encounter (HOSPITAL_COMMUNITY): Payer: Self-pay

## 2023-05-24 ENCOUNTER — Telehealth: Payer: Self-pay

## 2023-05-24 ENCOUNTER — Emergency Department (HOSPITAL_COMMUNITY)
Admission: EM | Admit: 2023-05-24 | Discharge: 2023-05-24 | Disposition: A | Discharge status: Home / Self Care | Payer: Medicaid Other | Attending: Emergency Medicine | Admitting: Emergency Medicine

## 2023-05-24 DIAGNOSIS — R110 Nausea: Secondary | ICD-10-CM | POA: Diagnosis not present

## 2023-05-24 DIAGNOSIS — R109 Unspecified abdominal pain: Secondary | ICD-10-CM | POA: Insufficient documentation

## 2023-05-24 DIAGNOSIS — N185 Chronic kidney disease, stage 5: Secondary | ICD-10-CM | POA: Diagnosis not present

## 2023-05-24 DIAGNOSIS — R197 Diarrhea, unspecified: Secondary | ICD-10-CM | POA: Insufficient documentation

## 2023-05-24 DIAGNOSIS — R112 Nausea with vomiting, unspecified: Secondary | ICD-10-CM | POA: Insufficient documentation

## 2023-05-24 DIAGNOSIS — R1111 Vomiting without nausea: Secondary | ICD-10-CM | POA: Diagnosis not present

## 2023-05-24 LAB — CBC
HCT: 32.4 % — ABNORMAL LOW (ref 39.0–52.0)
Hemoglobin: 10.9 g/dL — ABNORMAL LOW (ref 13.0–17.0)
MCH: 23.3 pg — ABNORMAL LOW (ref 26.0–34.0)
MCHC: 33.6 g/dL (ref 30.0–36.0)
MCV: 69.2 fL — ABNORMAL LOW (ref 80.0–100.0)
Platelets: 246 10*3/uL (ref 150–400)
RBC: 4.68 MIL/uL (ref 4.22–5.81)
RDW: 20.1 % — ABNORMAL HIGH (ref 11.5–15.5)
WBC: 8.3 10*3/uL (ref 4.0–10.5)
nRBC: 0 % (ref 0.0–0.2)

## 2023-05-24 LAB — COMPREHENSIVE METABOLIC PANEL
ALT: 24 U/L (ref 0–44)
AST: 31 U/L (ref 15–41)
Albumin: 3.1 g/dL — ABNORMAL LOW (ref 3.5–5.0)
Alkaline Phosphatase: 58 U/L (ref 38–126)
Anion gap: 16 — ABNORMAL HIGH (ref 5–15)
BUN: 136 mg/dL — ABNORMAL HIGH (ref 6–20)
CO2: 17 mmol/L — ABNORMAL LOW (ref 22–32)
Calcium: 7.2 mg/dL — ABNORMAL LOW (ref 8.9–10.3)
Chloride: 98 mmol/L (ref 98–111)
Creatinine, Ser: 11.23 mg/dL — ABNORMAL HIGH (ref 0.61–1.24)
GFR, Estimated: 5 mL/min — ABNORMAL LOW (ref >60)
Glucose, Bld: 86 mg/dL (ref 70–99)
Potassium: 3.4 mmol/L — ABNORMAL LOW (ref 3.5–5.1)
Sodium: 131 mmol/L — ABNORMAL LOW (ref 135–145)
Total Bilirubin: 0.5 mg/dL (ref 0.3–1.2)
Total Protein: 7.7 g/dL (ref 6.5–8.1)

## 2023-05-24 LAB — LIPASE, BLOOD: Lipase: 84 U/L — ABNORMAL HIGH (ref 11–51)

## 2023-05-24 MED ORDER — ONDANSETRON HCL 4 MG PO TABS: 4.0000 mg | ORAL_TABLET | Freq: Four times a day (QID) | ORAL | 0 refills | Status: DC | PRN

## 2023-05-24 MED ORDER — ONDANSETRON 4 MG PO TBDP
4.0000 mg | ORAL_TABLET | Freq: Once | ORAL | Status: AC
  Administered 2023-05-24: 4 mg via ORAL
  Filled 2023-05-24: qty 1

## 2023-05-24 NOTE — ED Triage Notes (Signed)
Pt has been having nausea, vomiting and diarrhea x 2 days. Pt has no abd pain, no chest pain, or any other complaint.   Medic Vitals  122/72 62hr 98%ra 18rr

## 2023-05-24 NOTE — Telephone Encounter (Signed)
Attempted to reach patient to confirm colonoscopy at [facility] on 05/28/23. Pt does not have vm set up at this time. Pt also currently being evaluated in ED for nausea, vomiting, and diarrhea x 2 days.

## 2023-05-24 NOTE — ED Notes (Signed)
Patient given sandwich bag and drink for po challenge.

## 2023-05-24 NOTE — ED Provider Notes (Signed)
[place] EMERGENCY DEPARTMENT AT [facility] Provider Note   CSN: [id number] Arrival date & time: 05/24/23  1405     History Chief Complaint  Patient presents with   Nausea    HPI Johnny [name] is a 62 y.o. male presenting for chief complaint of nausea vomiting diarrhea.  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 intake today.  States that starting last night he had intolerance of his home medications vomiting of nonbilious nonbloody food material and then throughout the day today has had multiple episodes of nonbilious nonbloody diarrhea. Asking for supplementation to assist him in tolerating p.o. intake today.  No acute distress.  Feels that he be able to keep liquids down at this time..   Patient's recorded medical, surgical, social, medication list and allergies were reviewed in the Snapshot window as part of the initial history.   Review of Systems   Review of Systems  Constitutional:  Negative for chills and fever.  HENT:  Negative for ear pain and sore throat.   Eyes:  Negative for pain and visual disturbance.  Respiratory:  Negative for cough and shortness of breath.   Cardiovascular:  Negative for chest pain and palpitations.  Gastrointestinal:  Positive for nausea and vomiting. Negative for abdominal pain.  Genitourinary:  Negative for dysuria and hematuria.  Musculoskeletal:  Negative for arthralgias and back pain.  Skin:  Negative for color change and rash.  Neurological:  Negative for seizures and syncope.  All other systems reviewed and are negative.   Physical Exam Updated Vital Signs BP 131/79   Pulse 69   Temp 97.8 F (36.6 C) (Oral)   Resp 18   SpO2 100%  Physical Exam Vitals and nursing note reviewed.  Constitutional:      General: He is not in acute distress.    Appearance: He is well-developed.  HENT:     Head: Normocephalic and atraumatic.  Eyes:     Conjunctiva/sclera: Conjunctivae normal.   Cardiovascular:     Rate and Rhythm: Normal rate and regular rhythm.     Heart sounds: No murmur heard. Pulmonary:     Effort: Pulmonary effort is normal. No respiratory distress.     Breath sounds: Normal breath sounds.  Abdominal:     Palpations: Abdomen is soft.     Tenderness: There is abdominal tenderness.  Musculoskeletal:        General: No swelling.     Cervical back: Neck supple.  Skin:    General: Skin is warm and dry.     Capillary Refill: Capillary refill takes less than 2 seconds.  Neurological:     Mental Status: He is alert.  Psychiatric:        Mood and Affect: Mood normal.      ED Course/ Medical Decision Making/ A&P    Procedures Procedures   Medications Ordered in ED Medications  ondansetron (ZOFRAN-ODT) disintegrating tablet 4 mg (4 mg Oral Given 05/24/23 21257)   This (85-year-old male presenting with a chief complaint of poor p.o. intake. Extensive renal disease medical comorbidities. Described above but nonbilious nonbloody vomitus and diarrhea consistent with likely gastroenteritis.  His exam is completely benign not consistent with acute interabdominal pathology such as appendicitis cholecystitis or small bowel obstruction at this time.  Screening labs were performed in triage demonstrating continuation of his known CKD without any other focal anemia leukocytosis or acute pathology.  Discussed that patient needs to  follow-up with his nephrologist for longer-term management of his kidney failure but is in no acute distress at this time. As far as his symptoms today, will treat with Zofran trial p.o. intake and plan for reassessment of p.o. rehydration.  Reassessment: Reassessed [name].  Symptoms grossly improved.  He is tolerating p.o. intake with Zofran feels comfortable with ongoing care and management outpatient setting of likely gastroenteritis.  Disposition:  I have considered need for hospitalization, however, considering all of the above,  I believe this patient is stable for discharge at this time.  Patient/family educated about specific return precautions for given chief complaint and symptoms.  Patient/family educated about follow-up with PCP.     Patient/family expressed understanding of return precautions and need for follow-up. Patient spoken to regarding all imaging and laboratory results and appropriate follow up for these results. All education provided in verbal form with additional information in written form. Time was allowed for answering of patient questions. Patient discharged.    Emergency Department Medication Summary:   Medications  ondansetron (ZOFRAN-ODT) disintegrating tablet 4 mg (4 mg Oral Given 05/24/23 2121)    Clinical Impression:  1. Nausea      Discharge   Final Clinical Impression(s) / ED Diagnoses Final diagnoses:  Nausea    Rx / DC Orders ED Discharge Orders          Ordered    ondansetron (ZOFRAN) 4 MG tablet  Every 6 hours PRN        05/24/23 2251              Glyn Ade, MD 05/24/23 2350

## 2023-05-24 NOTE — Telephone Encounter (Signed)
Thank you for the note.  There is only a nursing triage note so far stating patient there for 2 days of N/V/D.  Please check Monday on what occurs with him so we know how to proceed re: his colonoscopy scheduled for 05/28/23.. - HD

## 2023-05-27 ENCOUNTER — Telehealth: Payer: Self-pay

## 2023-05-27 NOTE — Telephone Encounter (Signed)
Colonoscopy scheduled at [facility] on 06/13/23 at 7:30 am. Pt will need to arrive by 6 am with a care partner. Attempted to reach patient by phone to discuss new appt time. Unable to leave a vm at this time.

## 2023-05-27 NOTE — Transitions of Care (Post Inpatient/ED Visit) (Signed)
   05/27/2023  Name: KAYVAN HOEFLING MRN: [id number] DOB: 06/10/1961  Today's TOC FU Call Status:    Attempted to reach the patient regarding the most recent Inpatient/ED visit.  Follow Up Plan: Additional outreach attempts will be made to reach the patient to complete the Transitions of Care (Post Inpatient/ED visit) call.   Abelino Derrick, MHA [facility]  [address] [facility] Social Worker [phone]

## 2023-05-27 NOTE — Telephone Encounter (Signed)
Called to follow up with patient after ER visit last week. Pt states that he was recovering from a Gout flare up then started to have nausea. Pt reports that his nausea has improved some, he has not eaten any solid foods in the last 5 days. He does not feel that he can adequately prep for colonoscopy tomorrow. Pt would like to take some time to get better before proceeding with colonoscopy. Pt is aware that we will add his name back to the hospital wait list and will contact him once an appt becomes available. Pt verbalized understanding and had no concerns at the end of the call.

## 2023-05-27 NOTE — Telephone Encounter (Signed)
There appears to be a slot open on my August 8th AM hospital block.  Please offer it to him with same prep (adjust AM prep start time if needed) Arrive half hour earlier than usual for DDAVP order.  - HD

## 2023-05-28 ENCOUNTER — Encounter (HOSPITAL_COMMUNITY): Payer: Medicaid Other

## 2023-05-28 ENCOUNTER — Encounter (HOSPITAL_COMMUNITY): Admission: RE | Payer: Self-pay | Source: Home / Self Care

## 2023-05-28 ENCOUNTER — Ambulatory Visit (HOSPITAL_COMMUNITY): Admission: RE | Admit: 2023-05-28 | Payer: Medicaid Other | Source: Home / Self Care | Admitting: Gastroenterology

## 2023-05-28 SURGERY — COLONOSCOPY WITH PROPOFOL
Anesthesia: Monitor Anesthesia Care

## 2023-05-28 NOTE — Telephone Encounter (Signed)
Attempted to reach patient again this morning. His vm is currently full, no option to leave a vm.

## 2023-05-31 ENCOUNTER — Encounter (HOSPITAL_COMMUNITY)
Admission: RE | Admit: 2023-05-31 | Discharge: 2023-05-31 | Disposition: A | Discharge status: Home / Self Care | Payer: Medicaid Other | Source: Ambulatory Visit | Attending: Internal Medicine | Admitting: Internal Medicine

## 2023-05-31 VITALS — BP 123/80 | HR 67 | Temp 97.2°F | Resp 17

## 2023-05-31 DIAGNOSIS — N185 Chronic kidney disease, stage 5: Secondary | ICD-10-CM | POA: Diagnosis not present

## 2023-05-31 LAB — RENAL FUNCTION PANEL
Albumin: 3.3 g/dL — ABNORMAL LOW (ref 3.5–5.0)
Anion gap: 17 — ABNORMAL HIGH (ref 5–15)
BUN: 147 mg/dL — ABNORMAL HIGH (ref 6–20)
CO2: 19 mmol/L — ABNORMAL LOW (ref 22–32)
Calcium: 7 mg/dL — ABNORMAL LOW (ref 8.9–10.3)
Chloride: 100 mmol/L (ref 98–111)
Creatinine, Ser: 11.79 mg/dL — ABNORMAL HIGH (ref 0.61–1.24)
GFR, Estimated: 4 mL/min — ABNORMAL LOW (ref >60)
Glucose, Bld: 94 mg/dL (ref 70–99)
Phosphorus: 5.2 mg/dL — ABNORMAL HIGH (ref 2.5–4.6)
Potassium: 3.1 mmol/L — ABNORMAL LOW (ref 3.5–5.1)
Sodium: 136 mmol/L (ref 135–145)

## 2023-05-31 LAB — IRON AND TIBC
Iron: 163 ug/dL (ref 45–182)
Saturation Ratios: 64 % — ABNORMAL HIGH (ref 17.9–39.5)
TIBC: 255 ug/dL (ref 250–450)
UIBC: 92 ug/dL

## 2023-05-31 LAB — FERRITIN: Ferritin: 734 ng/mL — ABNORMAL HIGH (ref 24–336)

## 2023-05-31 LAB — POCT HEMOGLOBIN-HEMACUE: Hemoglobin: 9.9 g/dL — ABNORMAL LOW (ref 13.0–17.0)

## 2023-05-31 MED ORDER — EPOETIN ALFA-EPBX 10000 UNIT/ML IJ SOLN
INTRAMUSCULAR | Status: AC
  Filled 2023-05-31: qty 2

## 2023-05-31 MED ORDER — EPOETIN ALFA-EPBX 10000 UNIT/ML IJ SOLN
20000.0000 [IU] | INTRAMUSCULAR | Status: DC
  Administered 2023-05-31: 20000 [IU] via SUBCUTANEOUS

## 2023-05-31 NOTE — Telephone Encounter (Signed)
3rd attempt to reach patient regarding new colonoscopy appt. Pt's vm is full at this time and I am unable to leave a message.

## 2023-06-03 NOTE — Telephone Encounter (Signed)
Called and spoke with patient regarding new colonoscopy appt. 06/13/23 at 7:30 am, arriving at 6 am with a care partner. Pt requested that updated instructions be sent via MyChart for him to review. Patient will contact his care partner today, if he can't make the appt he will call us and let us know. Otherwise, pt verbalized understanding.   Spoke with Clydie Braun at [employer], DDVAP order is present and it has been noted that patient will need this prior to his colonoscopy.   Updated colonoscopy instructions sent to patient.

## 2023-06-05 ENCOUNTER — Encounter (HOSPITAL_COMMUNITY): Payer: Medicaid Other

## 2023-06-05 DIAGNOSIS — I120 Hypertensive chronic kidney disease with stage 5 chronic kidney disease or end stage renal disease: Secondary | ICD-10-CM | POA: Diagnosis not present

## 2023-06-05 DIAGNOSIS — J449 Chronic obstructive pulmonary disease, unspecified: Secondary | ICD-10-CM | POA: Diagnosis not present

## 2023-06-05 DIAGNOSIS — D631 Anemia in chronic kidney disease: Secondary | ICD-10-CM | POA: Diagnosis not present

## 2023-06-05 DIAGNOSIS — M109 Gout, unspecified: Secondary | ICD-10-CM | POA: Diagnosis not present

## 2023-06-05 DIAGNOSIS — N185 Chronic kidney disease, stage 5: Secondary | ICD-10-CM | POA: Diagnosis not present

## 2023-06-06 LAB — LAB REPORT - SCANNED: EGFR: 5

## 2023-06-10 NOTE — Telephone Encounter (Signed)
Patient is returning your call.  

## 2023-06-10 NOTE — Telephone Encounter (Signed)
06/13/23 [facility] colonoscopy cancelled. Next available hospital date at [facility] with Dr. Myrtie Neither is 08/08/23. Pt will need a PV appt.   Attempted to reach patient. His vm is full at this time.

## 2023-06-10 NOTE — Telephone Encounter (Signed)
Patient called requesting to reschedule his hospital procedure states he could not find his prep instructions and he also no longer has a care partner available.

## 2023-06-10 NOTE — Telephone Encounter (Addendum)
Returned call to patient. He states that he would like to proceed with colonoscopy on 08/08/23 at 7:30 am. Pt will need to arrive by 5:30 am (for DDAVP) with a care partner. Pt has been scheduled for a telephone PV on 07/25/23 at 2 pm, pt knows to expect a call. Pt verbalized understanding and had no concerns at the end of the call.  Appt information mailed and sent to patient via MyChart.

## 2023-06-14 ENCOUNTER — Encounter (HOSPITAL_COMMUNITY)
Admission: RE | Admit: 2023-06-14 | Discharge: 2023-06-14 | Disposition: A | Discharge status: Home / Self Care | Payer: Medicaid Other | Source: Ambulatory Visit | Attending: Internal Medicine | Admitting: Internal Medicine

## 2023-06-14 VITALS — BP 119/77 | HR 72 | Temp 97.1°F | Resp 17

## 2023-06-14 DIAGNOSIS — N185 Chronic kidney disease, stage 5: Secondary | ICD-10-CM | POA: Insufficient documentation

## 2023-06-14 LAB — POCT HEMOGLOBIN-HEMACUE: Hemoglobin: 9.8 g/dL — ABNORMAL LOW (ref 13.0–17.0)

## 2023-06-14 MED ORDER — EPOETIN ALFA-EPBX 10000 UNIT/ML IJ SOLN: 20000.0000 [IU] | INTRAMUSCULAR | Status: DC

## 2023-06-14 MED ORDER — EPOETIN ALFA-EPBX 10000 UNIT/ML IJ SOLN
INTRAMUSCULAR | Status: AC
  Administered 2023-06-14: 20000 [IU] via SUBCUTANEOUS
  Filled 2023-06-14: qty 2

## 2023-06-21 IMAGING — CR DG CHEST 1V
1 series · 1 of 1 positions shown · non-contrast
Comparison: Chest radiograph dated 09/03/2016

CLINICAL DATA: Shortness of breath.

EXAM:
CHEST  1 VIEW

[chest pa]
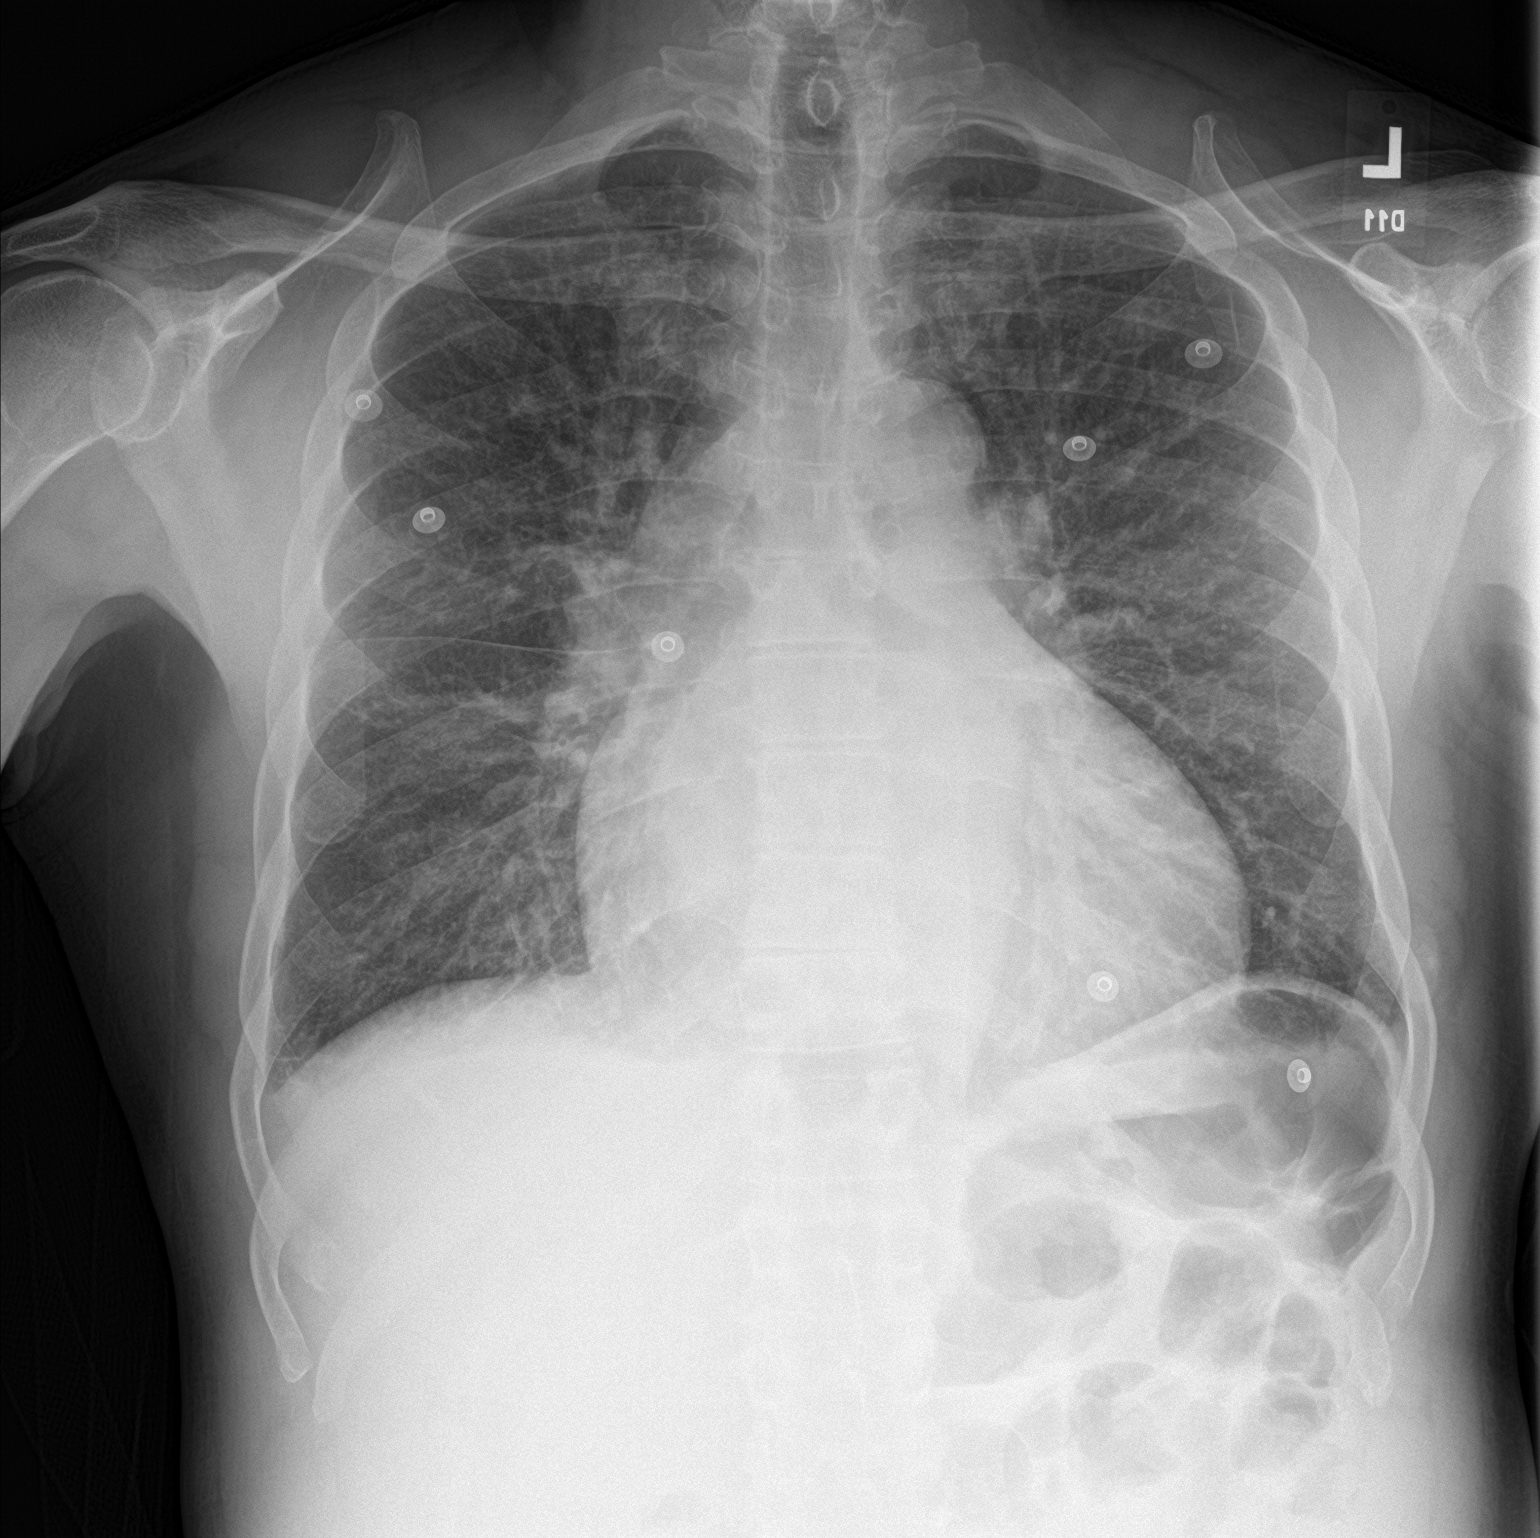

[1 of 1 positions shown; findings below may reference images not displayed]

FINDINGS: There is cardiomegaly with vascular congestion and mild edema. No
focal consolidation, pleural effusion or pneumothorax. No acute
osseous pathology.
IMPRESSION: Cardiomegaly with vascular congestion and mild edema.

## 2023-06-28 ENCOUNTER — Ambulatory Visit (HOSPITAL_COMMUNITY)
Admission: RE | Admit: 2023-06-28 | Discharge: 2023-06-28 | Disposition: A | Discharge status: Home / Self Care | Payer: Medicaid Other | Source: Ambulatory Visit | Attending: Internal Medicine | Admitting: Internal Medicine

## 2023-06-28 VITALS — BP 125/80 | HR 66 | Temp 97.3°F | Resp 17

## 2023-06-28 DIAGNOSIS — N185 Chronic kidney disease, stage 5: Secondary | ICD-10-CM | POA: Diagnosis present

## 2023-06-28 LAB — CBC WITH DIFFERENTIAL/PLATELET
Abs Immature Granulocytes: 0.01 10*3/uL (ref 0.00–0.07)
Basophils Absolute: 0.1 10*3/uL (ref 0.0–0.1)
Basophils Relative: 1 %
Eosinophils Absolute: 0.6 10*3/uL — ABNORMAL HIGH (ref 0.0–0.5)
Eosinophils Relative: 10 %
HCT: 32.1 % — ABNORMAL LOW (ref 39.0–52.0)
Hemoglobin: 10.4 g/dL — ABNORMAL LOW (ref 13.0–17.0)
Immature Granulocytes: 0 %
Lymphocytes Relative: 36 %
Lymphs Abs: 2 10*3/uL (ref 0.7–4.0)
MCH: 23.1 pg — ABNORMAL LOW (ref 26.0–34.0)
MCHC: 32.4 g/dL (ref 30.0–36.0)
MCV: 71.2 fL — ABNORMAL LOW (ref 80.0–100.0)
Monocytes Absolute: 0.8 10*3/uL (ref 0.1–1.0)
Monocytes Relative: 15 %
Neutro Abs: 2.1 10*3/uL (ref 1.7–7.7)
Neutrophils Relative %: 38 %
Platelets: 146 10*3/uL — ABNORMAL LOW (ref 150–400)
RBC: 4.51 MIL/uL (ref 4.22–5.81)
RDW: 21 % — ABNORMAL HIGH (ref 11.5–15.5)
WBC: 5.5 10*3/uL (ref 4.0–10.5)
nRBC: 0 % (ref 0.0–0.2)

## 2023-06-28 LAB — IRON AND TIBC
Iron: 111 ug/dL (ref 45–182)
Saturation Ratios: 46 % — ABNORMAL HIGH (ref 17.9–39.5)
TIBC: 244 ug/dL — ABNORMAL LOW (ref 250–450)
UIBC: 133 ug/dL

## 2023-06-28 LAB — RENAL FUNCTION PANEL
Albumin: 3.3 g/dL — ABNORMAL LOW (ref 3.5–5.0)
Anion gap: 19 — ABNORMAL HIGH (ref 5–15)
BUN: 96 mg/dL — ABNORMAL HIGH (ref 6–20)
CO2: 19 mmol/L — ABNORMAL LOW (ref 22–32)
Calcium: 7.2 mg/dL — ABNORMAL LOW (ref 8.9–10.3)
Chloride: 95 mmol/L — ABNORMAL LOW (ref 98–111)
Creatinine, Ser: 11.12 mg/dL — ABNORMAL HIGH (ref 0.61–1.24)
GFR, Estimated: 5 mL/min — ABNORMAL LOW (ref >60)
Glucose, Bld: 113 mg/dL — ABNORMAL HIGH (ref 70–99)
Phosphorus: 5.4 mg/dL — ABNORMAL HIGH (ref 2.5–4.6)
Potassium: 3.4 mmol/L — ABNORMAL LOW (ref 3.5–5.1)
Sodium: 133 mmol/L — ABNORMAL LOW (ref 135–145)

## 2023-06-28 LAB — FERRITIN: Ferritin: 556 ng/mL — ABNORMAL HIGH (ref 24–336)

## 2023-06-28 LAB — POCT HEMOGLOBIN-HEMACUE: Hemoglobin: 10.7 g/dL — ABNORMAL LOW (ref 13.0–17.0)

## 2023-06-28 MED ORDER — EPOETIN ALFA-EPBX 10000 UNIT/ML IJ SOLN
INTRAMUSCULAR | Status: AC
  Filled 2023-06-28: qty 2

## 2023-06-28 MED ORDER — EPOETIN ALFA-EPBX 10000 UNIT/ML IJ SOLN
20000.0000 [IU] | INTRAMUSCULAR | Status: DC
  Administered 2023-06-28: 20000 [IU] via SUBCUTANEOUS

## 2023-06-29 LAB — PTH, INTACT AND CALCIUM
Calcium, Total (PTH): 7.1 mg/dL — ABNORMAL LOW (ref 8.6–10.2)
PTH: 174 pg/mL — ABNORMAL HIGH (ref 15–65)

## 2023-06-29 LAB — HEPATITIS B SURFACE ANTIBODY, QUANTITATIVE: Hep B S AB Quant (Post): <3.5 m[IU]/mL — ABNORMAL LOW

## 2023-07-07 ENCOUNTER — Encounter (HOSPITAL_COMMUNITY): Payer: Self-pay

## 2023-07-08 ENCOUNTER — Other Ambulatory Visit: Payer: Self-pay | Admitting: Family Medicine

## 2023-07-08 DIAGNOSIS — L2089 Other atopic dermatitis: Secondary | ICD-10-CM

## 2023-07-08 DIAGNOSIS — E039 Hypothyroidism, unspecified: Secondary | ICD-10-CM

## 2023-07-12 ENCOUNTER — Encounter (HOSPITAL_COMMUNITY)
Admission: RE | Admit: 2023-07-12 | Discharge: 2023-07-12 | Disposition: A | Discharge status: Home / Self Care | Payer: MEDICAID | Source: Ambulatory Visit | Attending: Internal Medicine | Admitting: Internal Medicine

## 2023-07-12 VITALS — BP 140/86 | HR 66 | Temp 97.4°F | Resp 16

## 2023-07-12 DIAGNOSIS — N185 Chronic kidney disease, stage 5: Secondary | ICD-10-CM | POA: Insufficient documentation

## 2023-07-12 LAB — POCT HEMOGLOBIN-HEMACUE: Hemoglobin: 10.1 g/dL — ABNORMAL LOW (ref 13.0–17.0)

## 2023-07-12 MED ORDER — EPOETIN ALFA-EPBX 10000 UNIT/ML IJ SOLN: 20000.0000 [IU] | INTRAMUSCULAR | Status: DC

## 2023-07-12 MED ORDER — EPOETIN ALFA-EPBX 10000 UNIT/ML IJ SOLN
INTRAMUSCULAR | Status: AC
  Administered 2023-07-12: 20000 [IU] via SUBCUTANEOUS
  Filled 2023-07-12: qty 2

## 2023-07-12 NOTE — Progress Notes (Signed)
This encounter was created in error - please disregard.

## 2023-07-22 ENCOUNTER — Encounter (HOSPITAL_COMMUNITY): Payer: Self-pay

## 2023-07-25 ENCOUNTER — Ambulatory Visit (AMBULATORY_SURGERY_CENTER): Payer: MEDICAID | Admitting: *Deleted

## 2023-07-25 VITALS — Ht 69.0 in | Wt 180.0 lb

## 2023-07-25 DIAGNOSIS — Z86010 Personal history of colonic polyps: Secondary | ICD-10-CM

## 2023-07-25 MED ORDER — PEG 3350-KCL-NA BICARB-NACL 420 G PO SOLR
4000.0000 mL | Freq: Once | ORAL | 0 refills | Status: AC
Start: 2023-07-25 — End: 2023-07-25

## 2023-07-25 NOTE — Progress Notes (Signed)
Pt's name and DOB verified at the beginning of the pre-visit.  Pt denies any difficulty with ambulating,sitting, laying down or rolling side to side Gave both [facility] main # and MD on call # prior to instructions.  No egg or soy allergy known to patient  No issues known to pt with past sedation with any surgeries or procedures Pt denies having issues being intubated Pt has no issues moving head neck or swallowing No FH of Malignant Hyperthermia Pt is not on diet pills Pt is not on home 02  Pt is not on blood thinners  Pt denies issues with constipation  Pt is not on dialysis Pt denise any abnormal heart rhythms  Pt denies any upcoming cardiac testing Pt encouraged to use to use Singlecare or Goodrx to reduce cost  Patient's chart reviewed by Cathlyn Parsons CNRA prior to pre-visit and patient appropriate for the [facility].  Pre-visit completed and red dot placed by patient's name on their procedure day (on provider's schedule).  . Visit by phone Pt states weight is 180 lb Instructed pt why it is important to and  to call if they have any changes in health or new medications. Directed them to the # given and on instructions.   Pt states they will.  Instructions reviewed with pt and pt states understanding. Instructed to review again prior to procedure. Pt states they will.  Instructions sent by mail with coupon and by my chart

## 2023-07-26 ENCOUNTER — Encounter (HOSPITAL_COMMUNITY)
Admission: RE | Admit: 2023-07-26 | Discharge: 2023-07-26 | Disposition: A | Discharge status: Home / Self Care | Payer: MEDICAID | Source: Ambulatory Visit | Attending: Internal Medicine | Admitting: Internal Medicine

## 2023-07-26 VITALS — BP 119/82 | HR 75 | Temp 97.4°F | Resp 17

## 2023-07-26 DIAGNOSIS — N185 Chronic kidney disease, stage 5: Secondary | ICD-10-CM | POA: Diagnosis not present

## 2023-07-26 LAB — RENAL FUNCTION PANEL
Albumin: 3.3 g/dL — ABNORMAL LOW (ref 3.5–5.0)
Anion gap: 14 (ref 5–15)
BUN: 119 mg/dL — ABNORMAL HIGH (ref 6–20)
CO2: 20 mmol/L — ABNORMAL LOW (ref 22–32)
Calcium: 7.3 mg/dL — ABNORMAL LOW (ref 8.9–10.3)
Chloride: 100 mmol/L (ref 98–111)
Creatinine, Ser: 11.3 mg/dL — ABNORMAL HIGH (ref 0.61–1.24)
GFR, Estimated: 5 mL/min — ABNORMAL LOW (ref >60)
Glucose, Bld: 82 mg/dL (ref 70–99)
Phosphorus: 4.7 mg/dL — ABNORMAL HIGH (ref 2.5–4.6)
Potassium: 3.9 mmol/L (ref 3.5–5.1)
Sodium: 134 mmol/L — ABNORMAL LOW (ref 135–145)

## 2023-07-26 LAB — FERRITIN: Ferritin: 526 ng/mL — ABNORMAL HIGH (ref 24–336)

## 2023-07-26 LAB — IRON AND TIBC
Iron: 80 ug/dL (ref 45–182)
Saturation Ratios: 31 % (ref 17.9–39.5)
TIBC: 256 ug/dL (ref 250–450)
UIBC: 176 ug/dL

## 2023-07-26 LAB — POCT HEMOGLOBIN-HEMACUE: Hemoglobin: 10.2 g/dL — ABNORMAL LOW (ref 13.0–17.0)

## 2023-07-26 MED ORDER — EPOETIN ALFA-EPBX 10000 UNIT/ML IJ SOLN
INTRAMUSCULAR | Status: AC
  Filled 2023-07-26: qty 2

## 2023-07-26 MED ORDER — EPOETIN ALFA-EPBX 10000 UNIT/ML IJ SOLN
20000.0000 [IU] | INTRAMUSCULAR | Status: DC
  Administered 2023-07-26: 20000 [IU] via SUBCUTANEOUS

## 2023-07-29 LAB — PTH, INTACT AND CALCIUM
Calcium, Total (PTH): 7.5 mg/dL — ABNORMAL LOW (ref 8.6–10.2)
PTH: 187 pg/mL — ABNORMAL HIGH (ref 15–65)

## 2023-08-01 ENCOUNTER — Encounter (HOSPITAL_COMMUNITY): Payer: Self-pay | Admitting: Gastroenterology

## 2023-08-07 ENCOUNTER — Encounter (HOSPITAL_COMMUNITY): Payer: Self-pay | Admitting: Certified Registered"

## 2023-08-07 ENCOUNTER — Telehealth: Payer: Self-pay | Admitting: Gastroenterology

## 2023-08-07 NOTE — Anesthesia Preprocedure Evaluation (Signed)
Anesthesia Evaluation    Reviewed: Allergy & Precautions, Patient's Chart, lab work & pertinent test results  Airway        Dental   Pulmonary sleep apnea , COPD, former smoker          Cardiovascular hypertension, Pt. on medications +CHF    09/2022 Echo 1. Left ventricular ejection fraction, by estimation, is 55 to 60% . The left ventricle has normal function. The left ventricle has no regional wall motion abnormalities. Left ventricular diastolic parameters are consistent with Grade II diastolic dysfunction ( pseudonormalization) .  2. Right ventricular systolic function is normal. The right ventricular size is mildly enlarged. There is normal pulmonary artery systolic pressure. The estimated right ventricular systolic pressure is 31. 1 mmHg.  3. Left atrial size was mild to moderately dilated.  4. A small pericardial effusion is present. The pericardial effusion is circumferential. 5. The mitral valve is grossly normal. Mild mitral valve regurgitation. No evidence of mitral stenosis.  6. The aortic valve is tricuspid. Aortic valve regurgitation is not visualized. No aortic stenosis is present.  7. The inferior vena cava is normal in size with greater than 50% respiratory variability, suggesting right atrial pressure of 3 mmHg.   Neuro/Psych    GI/Hepatic ,,,(+) Hepatitis - (hx), C  Endo/Other  Hypothyroidism    Renal/GU CRFRenal disease     Musculoskeletal   Abdominal   Peds  Hematology  (+) Blood dyscrasia, anemia Pt requires DDAVP   Anesthesia Other Findings All: Heparin, Naproxen  Reproductive/Obstetrics                              Anesthesia Physical Anesthesia Plan  ASA: 3  Anesthesia Plan: MAC   Post-op Pain Management: Minimal or no pain anticipated   Induction:   PONV Risk Score and Plan: Treatment may vary due to age or medical condition and Propofol infusion  Airway  Management Planned:   Additional Equipment: None  Intra-op Plan:   Post-operative Plan:   Informed Consent:      Dental advisory given  Plan Discussed with:   Anesthesia Plan Comments: (Hx of Colon polyps  for colonoscopy)         Anesthesia Quick Evaluation

## 2023-08-07 NOTE — Telephone Encounter (Signed)
Patient scheduled for colonoscopy at [facility] tomorrow. Started prep and not tolerating it due to nausea. Discussed options of Zofran with continued prep or Miralax, and he'd like to try Miralax prep instead. Provided with instructions for Miralax prep. Will attempt that this evening instead. If unable to tolerate, to call back or call [facility] endo in the AM.   All questions answered and appreciative of phone call and instructions.

## 2023-08-08 ENCOUNTER — Ambulatory Visit (HOSPITAL_COMMUNITY): Admission: RE | Admit: 2023-08-08 | Payer: MEDICAID | Source: Home / Self Care | Admitting: Gastroenterology

## 2023-08-08 ENCOUNTER — Other Ambulatory Visit: Payer: Self-pay

## 2023-08-08 SURGERY — COLONOSCOPY WITH PROPOFOL
Anesthesia: Monitor Anesthesia Care

## 2023-08-08 NOTE — Telephone Encounter (Signed)
Patient called on call again this AM. States he was never able to go out and get the Miralax and could not retrial the prescribed prep due to ongoing nausea. He politely requested to cancel today's colonoscopy and reschedule to another date with an alternate prep and Rx for antiemetics. Will forward to Dr. Myrtie Neither for awareness and assistance in rescheduling.

## 2023-08-08 NOTE — Telephone Encounter (Signed)
Kaira,  Please see the text stream below that this patient, who was scheduled for a surveillance colonoscopy with me at [facility] long this morning, had to cancel because he was unable to tolerate bowel preparation.  Please reschedule him to my next available hospital date, and ensure the following:  Bowel preparation with Dulcolax and MiraLAX (per our standard instructions)  Ondansetron 4 mg tablet, prescribe 2 tablets so that he can take one tablet 30 minutes before beginning each of the prep doses.  He cannot be the first or the last procedure of the block because he needs to receive 25 micrograms of DDAVP 30 to 60 minutes prior to his procedure.  He therefore needs to show up 90 minutes prior to his procedure time, and orders for that medication need to be included with his preprocedure orders and scheduling instructions for the endoscopy department.   Thank you  Ellwood Dense, MD

## 2023-08-09 ENCOUNTER — Ambulatory Visit (HOSPITAL_COMMUNITY)
Admission: RE | Admit: 2023-08-09 | Discharge: 2023-08-09 | Disposition: A | Discharge status: Home / Self Care | Payer: MEDICAID | Source: Ambulatory Visit | Attending: Internal Medicine | Admitting: Internal Medicine

## 2023-08-09 VITALS — BP 140/87 | HR 75 | Temp 97.6°F | Resp 16

## 2023-08-09 DIAGNOSIS — N185 Chronic kidney disease, stage 5: Secondary | ICD-10-CM | POA: Insufficient documentation

## 2023-08-09 LAB — RENAL FUNCTION PANEL
Albumin: 3.3 g/dL — ABNORMAL LOW (ref 3.5–5.0)
Anion gap: 15 (ref 5–15)
BUN: 135 mg/dL — ABNORMAL HIGH (ref 6–20)
CO2: 19 mmol/L — ABNORMAL LOW (ref 22–32)
Calcium: 7.2 mg/dL — ABNORMAL LOW (ref 8.9–10.3)
Chloride: 100 mmol/L (ref 98–111)
Creatinine, Ser: 10.8 mg/dL — ABNORMAL HIGH (ref 0.61–1.24)
GFR, Estimated: 5 mL/min — ABNORMAL LOW (ref >60)
Glucose, Bld: 84 mg/dL (ref 70–99)
Phosphorus: 5.3 mg/dL — ABNORMAL HIGH (ref 2.5–4.6)
Potassium: 4.4 mmol/L (ref 3.5–5.1)
Sodium: 134 mmol/L — ABNORMAL LOW (ref 135–145)

## 2023-08-09 LAB — POCT HEMOGLOBIN-HEMACUE: Hemoglobin: 10 g/dL — ABNORMAL LOW (ref 13.0–17.0)

## 2023-08-09 LAB — URIC ACID: Uric Acid, Serum: 10.3 mg/dL — ABNORMAL HIGH (ref 3.7–8.6)

## 2023-08-09 MED ORDER — EPOETIN ALFA-EPBX 10000 UNIT/ML IJ SOLN: 20000.0000 [IU] | INTRAMUSCULAR | Status: DC

## 2023-08-09 MED ORDER — EPOETIN ALFA-EPBX 10000 UNIT/ML IJ SOLN
INTRAMUSCULAR | Status: AC
  Administered 2023-08-09: 20000 [IU] via SUBCUTANEOUS
  Filled 2023-08-09: qty 2

## 2023-08-09 NOTE — Telephone Encounter (Signed)
VM box full unable to leave message. Rescheduled colon at [facility] for 09/19/23 at 9:30 am. Previous orders are still in place which include DDAVP to be administered 30 - 60 minutes prior to procedure. Will confirm date/time with patient & will send zofran prescription and updated instructions.

## 2023-08-12 ENCOUNTER — Other Ambulatory Visit: Payer: Self-pay | Admitting: Physician Assistant

## 2023-08-12 DIAGNOSIS — E7800 Pure hypercholesterolemia, unspecified: Secondary | ICD-10-CM

## 2023-08-12 LAB — PTH, INTACT AND CALCIUM
Calcium, Total (PTH): 7.3 mg/dL — ABNORMAL LOW (ref 8.6–10.2)
PTH: 171 pg/mL — ABNORMAL HIGH (ref 15–65)

## 2023-08-16 NOTE — Telephone Encounter (Signed)
Unable to reach patient, VM box full.

## 2023-08-22 NOTE — Telephone Encounter (Signed)
Unable to reach patient, VM box full.

## 2023-08-23 ENCOUNTER — Inpatient Hospital Stay (HOSPITAL_COMMUNITY): Admission: RE | Admit: 2023-08-23 | Payer: MEDICAID | Source: Ambulatory Visit

## 2023-08-23 NOTE — Telephone Encounter (Signed)
Unable to reach patient, VM box full. Letter sent to patient.

## 2023-08-28 ENCOUNTER — Encounter (HOSPITAL_COMMUNITY)
Admission: RE | Admit: 2023-08-28 | Discharge: 2023-08-28 | Disposition: A | Discharge status: Home / Self Care | Payer: MEDICAID | Source: Ambulatory Visit | Attending: Internal Medicine | Admitting: Internal Medicine

## 2023-08-28 VITALS — BP 132/78 | HR 61 | Temp 97.0°F | Resp 16

## 2023-08-28 DIAGNOSIS — N185 Chronic kidney disease, stage 5: Secondary | ICD-10-CM | POA: Diagnosis present

## 2023-08-28 LAB — POCT HEMOGLOBIN-HEMACUE: Hemoglobin: 9.2 g/dL — ABNORMAL LOW (ref 13.0–17.0)

## 2023-08-28 LAB — IRON AND TIBC
Iron: 157 ug/dL (ref 45–182)
Saturation Ratios: 59 % — ABNORMAL HIGH (ref 17.9–39.5)
TIBC: 265 ug/dL (ref 250–450)
UIBC: 108 ug/dL

## 2023-08-28 LAB — FERRITIN: Ferritin: 462 ng/mL — ABNORMAL HIGH (ref 24–336)

## 2023-08-28 MED ORDER — EPOETIN ALFA-EPBX 10000 UNIT/ML IJ SOLN
20000.0000 [IU] | INTRAMUSCULAR | Status: DC
  Administered 2023-08-28: 20000 [IU] via SUBCUTANEOUS

## 2023-08-28 MED ORDER — EPOETIN ALFA-EPBX 10000 UNIT/ML IJ SOLN
INTRAMUSCULAR | Status: AC
  Filled 2023-08-28: qty 2

## 2023-08-29 NOTE — Telephone Encounter (Signed)
Unable to reach patient, VM box full. Letter mailed home.

## 2023-09-05 NOTE — Telephone Encounter (Signed)
Attempted to reach alternate contact listed, however his VM box is full.

## 2023-09-06 ENCOUNTER — Encounter (HOSPITAL_COMMUNITY): Payer: MEDICAID

## 2023-09-06 NOTE — Telephone Encounter (Signed)
Cancelled hospital procedure.

## 2023-09-06 NOTE — Telephone Encounter (Signed)
Thank you for the note.  I appreciate you documenting your extensive efforts to reach him for procedure scheduling.  I believe he has been given sufficient information and attempt to contact us, and at this point he can contact us when and if he feels ready to undergo the procedure.  If he does contact us, he should then be put on our waiting list.  Ellwood Dense, MD

## 2023-09-11 ENCOUNTER — Encounter (HOSPITAL_COMMUNITY): Payer: MEDICAID

## 2023-09-16 ENCOUNTER — Other Ambulatory Visit: Payer: Self-pay | Admitting: Physician Assistant

## 2023-09-16 ENCOUNTER — Other Ambulatory Visit: Payer: Self-pay | Admitting: Family Medicine

## 2023-09-16 DIAGNOSIS — E7800 Pure hypercholesterolemia, unspecified: Secondary | ICD-10-CM

## 2023-09-16 DIAGNOSIS — I10 Essential (primary) hypertension: Secondary | ICD-10-CM

## 2023-09-17 NOTE — Telephone Encounter (Signed)
Requested medication (s) are due for refill today: yes  Requested medication (s) are on the active medication list: yes  Last refill:  08/12/23 #30  Future visit scheduled:no  Notes to clinic:  overdue labs- sent pt message via MyChart   Requested Prescriptions  Pending Prescriptions Disp Refills   atorvastatin (LIPITOR) 40 MG tablet [Pharmacy Med Name: Atorvastatin Calcium 40 MG Oral Tablet] 30 tablet 0    Sig: TAKE 1 TABLET BY MOUTH ONCE DAILY. MUST HAVE OFFICE VISIT FOR REFILLS     Cardiovascular:  Antilipid - Statins Failed - 09/16/2023 11:02 AM      Failed - Lipid Panel in normal range within the last 12 months    Cholesterol, Total  Date Value Ref Range Status  11/10/2018 214 (H) 100 - 199 mg/dL Final   Cholesterol  Date Value Ref Range Status  05/24/2022 123 0 - 200 mg/dL Final   LDL Calculated  Date Value Ref Range Status  11/10/2018 102 (H) 0 - 99 mg/dL Final   LDL Cholesterol  Date Value Ref Range Status  05/24/2022 49 0 - 99 mg/dL Final    Comment:           Total Cholesterol/HDL:CHD Risk Coronary Heart Disease Risk Table                     Men   Women  1/2 Average Risk   3.4   3.3  Average Risk       5.0   4.4  2 X Average Risk   9.6   7.1  3 X Average Risk  23.4   11.0        Use the calculated Patient Ratio above and the CHD Risk Table to determine the patient's CHD Risk.        ATP III CLASSIFICATION (LDL):  <100     mg/dL   Optimal  696-295  mg/dL   Near or Above                    Optimal  130-159  mg/dL   Borderline  284-132  mg/dL   High  >440     mg/dL   Very High Performed at [facility], [address] [place], [place] [address]    HDL  Date Value Ref Range Status  05/24/2022 65 >40 mg/dL Final  53/66/4403 96 >47 mg/dL Final   Triglycerides  Date Value Ref Range Status  05/24/2022 44 <150 mg/dL Final         Passed - Patient is not pregnant      Passed - Valid encounter within last 12 months    Recent Outpatient  Visits           5 months ago Tinea pedis of both feet   [place] [facility] - A Dept Of [place]. [facility] Hoy Register, MD   7 months ago Screening for diabetes mellitus   [place] Comm Health [place] - A Dept Of [place]. [facility] Hoy Register, MD   9 months ago Attention deficit   [place] Comm Health Bradd[place] - A Dept Of [place]. [facility] Alvis Lemmings, Odette Horns, MD   10 months ago CKD (chronic kidney disease) stage 5, GFR less than 15 ml/min (HCC)   [place] Comm Health Merry Proud - A Dept Of [place]. [facility] [place], [place], [place]   1 year ago Chronic kidney  disease (CKD), stage V (HCC)   [place] Comm [facility] Wellnss - A Dept Of [place]. [facility] Hoy Register, MD

## 2023-09-17 NOTE — Telephone Encounter (Signed)
OV needed for additional refills, 30 day supply given until OV can be made.  Requested Prescriptions  Pending Prescriptions Disp Refills   amLODipine (NORVASC) 10 MG tablet [Pharmacy Med Name: amLODIPine Besylate 10 MG Oral Tablet] 30 tablet 0    Sig: Take 1 tablet by mouth once daily     Cardiovascular: Calcium Channel Blockers 2 Failed - 09/16/2023 11:02 AM      Failed - Valid encounter within last 6 months    Recent Outpatient Visits           5 months ago Tinea pedis of both feet   [place] [facility] Wellnss - A Dept Of [place]. [facility] Hoy Register, MD   7 months ago Screening for diabetes mellitus   [place] Comm Health [place] - A Dept Of [place]. [facility] Hoy Register, MD   9 months ago Attention deficit   [place] Comm Health Ch[place] - A Dept Of [place]. [facility] Alvis Lemmings, Odette Horns, MD   10 months ago CKD (chronic kidney disease) stage 5, GFR less than 15 ml/min (HCC)   [place] Comm Health Merry Proud - A Dept Of [place]. [facility] [place], Marzella Schlein, [place]   1 year ago Chronic kidney disease (CKD), stage V [facility])   [place] Comm Health Merry Proud - A Dept Of . [facility], Odette Horns, MD              Passed - Last BP in normal range    BP Readings from Last 1 Encounters:  08/28/23 132/78         Passed - Last Heart Rate in normal range    Pulse Readings from Last 1 Encounters:  08/28/23 61

## 2023-09-19 ENCOUNTER — Encounter (HOSPITAL_COMMUNITY): Payer: Self-pay

## 2023-09-19 ENCOUNTER — Ambulatory Visit (HOSPITAL_COMMUNITY): Admit: 2023-09-19 | Payer: MEDICAID | Admitting: Gastroenterology

## 2023-09-19 SURGERY — COLONOSCOPY WITH PROPOFOL
Anesthesia: Monitor Anesthesia Care

## 2023-09-25 ENCOUNTER — Encounter (HOSPITAL_COMMUNITY): Payer: MEDICAID

## 2023-09-27 ENCOUNTER — Ambulatory Visit (HOSPITAL_COMMUNITY)
Admission: RE | Admit: 2023-09-27 | Discharge: 2023-09-27 | Disposition: A | Discharge status: Home / Self Care | Payer: MEDICAID | Source: Ambulatory Visit | Attending: Internal Medicine | Admitting: Internal Medicine

## 2023-09-27 VITALS — BP 127/85 | HR 76 | Temp 97.3°F | Resp 16

## 2023-09-27 DIAGNOSIS — N185 Chronic kidney disease, stage 5: Secondary | ICD-10-CM | POA: Diagnosis present

## 2023-09-27 LAB — RENAL FUNCTION PANEL
Albumin: 3.3 g/dL — ABNORMAL LOW (ref 3.5–5.0)
Anion gap: 18 — ABNORMAL HIGH (ref 5–15)
BUN: 138 mg/dL — ABNORMAL HIGH (ref 6–20)
CO2: 18 mmol/L — ABNORMAL LOW (ref 22–32)
Calcium: 6.5 mg/dL — ABNORMAL LOW (ref 8.9–10.3)
Chloride: 102 mmol/L (ref 98–111)
Creatinine, Ser: 13.88 mg/dL — ABNORMAL HIGH (ref 0.61–1.24)
GFR, Estimated: 4 mL/min — ABNORMAL LOW (ref >60)
Glucose, Bld: 87 mg/dL (ref 70–99)
Phosphorus: 5.7 mg/dL — ABNORMAL HIGH (ref 2.5–4.6)
Potassium: 3.7 mmol/L (ref 3.5–5.1)
Sodium: 138 mmol/L (ref 135–145)

## 2023-09-27 LAB — FERRITIN: Ferritin: 613 ng/mL — ABNORMAL HIGH (ref 24–336)

## 2023-09-27 LAB — POCT HEMOGLOBIN-HEMACUE: Hemoglobin: 9 g/dL — ABNORMAL LOW (ref 13.0–17.0)

## 2023-09-27 LAB — IRON AND TIBC
Iron: 197 ug/dL — ABNORMAL HIGH (ref 45–182)
Saturation Ratios: 83 % — ABNORMAL HIGH (ref 17.9–39.5)
TIBC: 238 ug/dL — ABNORMAL LOW (ref 250–450)
UIBC: 41 ug/dL

## 2023-09-27 MED ORDER — EPOETIN ALFA-EPBX 10000 UNIT/ML IJ SOLN
INTRAMUSCULAR | Status: AC
  Filled 2023-09-27: qty 2

## 2023-09-27 MED ORDER — EPOETIN ALFA-EPBX 10000 UNIT/ML IJ SOLN
20000.0000 [IU] | INTRAMUSCULAR | Status: DC
Start: 2023-09-27 — End: 2024-10-09
  Administered 2023-09-27: 20000 [IU] via SUBCUTANEOUS

## 2023-09-30 LAB — PTH, INTACT AND CALCIUM
Calcium, Total (PTH): 6.4 mg/dL — CL (ref 8.6–10.2)
PTH: 209 pg/mL — ABNORMAL HIGH (ref 15–65)

## 2023-10-09 LAB — LAB REPORT - SCANNED: EGFR: 4

## 2023-10-11 ENCOUNTER — Inpatient Hospital Stay (HOSPITAL_COMMUNITY)
Admission: RE | Admit: 2023-10-11 | Discharge: 2023-10-11 | Disposition: A | Discharge status: Home / Self Care | Payer: MEDICAID | Source: Ambulatory Visit | Attending: Internal Medicine | Admitting: Internal Medicine

## 2023-10-11 ENCOUNTER — Other Ambulatory Visit: Payer: Self-pay | Admitting: Family Medicine

## 2023-10-11 ENCOUNTER — Encounter (HOSPITAL_COMMUNITY): Payer: Self-pay

## 2023-10-11 DIAGNOSIS — L2089 Other atopic dermatitis: Secondary | ICD-10-CM

## 2023-10-14 NOTE — Telephone Encounter (Signed)
Requested medication (s) are due for refill today: yes  Requested medication (s) are on the active medication list: yes  Last refill:  colchicine: 05/21/23 #30 1 RF                triamcinolone cream: 07/09/23 45 grams  Future visit scheduled: no  Notes to clinic:  sent pt a MyChart message and advised need to make appointment for refills   Requested Prescriptions  Pending Prescriptions Disp Refills   colchicine 0.6 MG tablet [Pharmacy Med Name: Colchicine 0.6 MG Oral Tablet] 30 tablet 0    Sig: TAKE 2 TABLETS BY MOUTH AT THE ONSET OF A GOUT FLARE. MAY REPEAT 1 TABLET IN 1 HOUR IF SYMPTOMS PERSIST     Endocrinology:  Gout Agents - colchicine Failed - 10/11/2023  5:35 PM      Failed - Cr in normal range and within 360 days    Creat  Date Value Ref Range Status  12/07/2016 2.78 (H) 0.70 - 1.33 mg/dL Final    Comment:      For patients > or = 60 years of age: The upper reference limit for Creatinine is approximately 13% higher for people identified as African-American.      Creatinine, Ser  Date Value Ref Range Status  09/27/2023 13.88 (H) 0.61 - 1.24 mg/dL Final   Creatinine, Urine  Date Value Ref Range Status  02/22/2009 225.5 mg/dL Final         Passed - ALT in normal range and within 360 days    ALT  Date Value Ref Range Status  05/24/2023 24 0 - 44 U/L Final  03/21/2016 28 9 - 46 U/L Final         Passed - AST in normal range and within 360 days    AST  Date Value Ref Range Status  05/24/2023 31 15 - 41 U/L Final         Passed - Valid encounter within last 12 months    Recent Outpatient Visits           6 months ago Tinea pedis of both feet   [place] [facility] - A Dept Of [name]. [facility] Hoy Register, MD   8 months ago Screening for diabetes mellitus   [place] Comm Health [place] - A Dept Of [place]. [facility] Hoy Register, MD   9 months ago Attention deficit   [place] Comm Health Sun[place] - A Dept Of  [place]. [facility] Hoy Register, MD   11 months ago CKD (chronic kidney disease) stage 5, GFR less than 15 ml/min (HCC)   [place] Comm Health Merry Proud - A Dept Of [place]. [facility] [place], Marzella Schlein, [place]   1 year ago Chronic kidney disease (CKD), stage V [facility])   [place] Comm Health Merry Proud - A Dept Of [place]. [facility] [place], Odette Horns, MD              Passed - CBC within normal limits and completed in the last 12 months    WBC  Date Value Ref Range Status  06/28/2023 5.5 4.0 - 10.5 K/uL Final   RBC  Date Value Ref Range Status  06/28/2023 4.51 4.22 - 5.81 MIL/uL Final   Hemoglobin  Date Value Ref Range Status  09/27/2023 9.0 (L) 13.0 - 17.0 g/dL Final  16/08/9603 54.0 13.0 - 17.7 g/dL Final   HCT  Date Value Ref  Range Status  06/28/2023 32.1 (L) 39.0 - 52.0 % Final   Hematocrit  Date Value Ref Range Status  11/08/2020 41.7 37.5 - 51.0 % Final   MCHC  Date Value Ref Range Status  06/28/2023 32.4 30.0 - 36.0 g/dL Final   [facility]  Date Value Ref Range Status  06/28/2023 23.1 (L) 26.0 - 34.0 pg Final   MCV  Date Value Ref Range Status  06/28/2023 71.2 (L) 80.0 - 100.0 fL Final  11/08/2020 77 (L) 79 - 97 fL Final   No results found for: "PLTCOUNTKUC", "LABPLAT", "POCPLA" RDW  Date Value Ref Range Status  06/28/2023 21.0 (H) 11.5 - 15.5 % Final  11/08/2020 15.8 (H) 11.6 - 15.4 % Final          triamcinolone cream (KENALOG) 0.1 % [Pharmacy Med Name: Triamcinolone Acetonide 0.1 % External Cream] 45 g 0    Sig: APPLY  CREAM EXTERNALLY TWICE DAILY . APPOINTMENT REQUIRED FOR FUTURE REFILLS     Not Delegated - Dermatology:  Corticosteroids Failed - 10/11/2023  5:35 PM      Failed - This refill cannot be delegated      Passed - Valid encounter within last 12 months    Recent Outpatient Visits           6 months ago Tinea pedis of both feet   [place] Comm [facility] Welln[facility] - A Dept Of [place]. [facility]  Hoy Register, MD   8 months ago Screening for diabetes mellitus   [place] Comm Health [place] - A Dept Of [place]. [facility] Hoy Register, MD   9 months ago Attention deficit   [place] Comm Health Grand[place] - A Dept Of [place]. [facility] Hoy Register, MD   11 months ago CKD (chronic kidney disease) stage 5, GFR less than 15 ml/min (HCC)   [place] Comm Health Merry Proud - A Dept Of [place]. [facility] [place], Marzella Schlein, [place]   1 year ago Chronic kidney disease (CKD), stage V [facility])    Comm Health Merry Proud - A Dept Of . [facility] Hoy Register, MD

## 2023-10-15 ENCOUNTER — Other Ambulatory Visit: Payer: Self-pay | Admitting: Family Medicine

## 2023-10-15 DIAGNOSIS — E7800 Pure hypercholesterolemia, unspecified: Secondary | ICD-10-CM

## 2023-10-22 ENCOUNTER — Other Ambulatory Visit: Payer: Self-pay | Admitting: Family Medicine

## 2023-10-22 DIAGNOSIS — I10 Essential (primary) hypertension: Secondary | ICD-10-CM

## 2023-10-22 DIAGNOSIS — L2089 Other atopic dermatitis: Secondary | ICD-10-CM

## 2023-10-23 ENCOUNTER — Other Ambulatory Visit: Payer: Self-pay | Admitting: Family Medicine

## 2023-10-23 DIAGNOSIS — I10 Essential (primary) hypertension: Secondary | ICD-10-CM

## 2023-10-23 DIAGNOSIS — L2089 Other atopic dermatitis: Secondary | ICD-10-CM

## 2023-10-23 DIAGNOSIS — E7800 Pure hypercholesterolemia, unspecified: Secondary | ICD-10-CM

## 2023-10-23 MED ORDER — ATORVASTATIN CALCIUM 40 MG PO TABS: 40.0000 mg | ORAL_TABLET | Freq: Every day | ORAL | 0 refills | Status: DC

## 2023-10-23 MED ORDER — TRIAMCINOLONE ACETONIDE 0.1 % EX CREA: TOPICAL_CREAM | CUTANEOUS | 0 refills | Status: DC

## 2023-10-23 MED ORDER — AMLODIPINE BESYLATE 10 MG PO TABS: 10.0000 mg | ORAL_TABLET | Freq: Every day | ORAL | 0 refills | Status: DC

## 2023-10-23 NOTE — Telephone Encounter (Signed)
Medication Refill -  Most Recent Primary Care Visit:  Provider: Hoy Register  Department: [facility]  Visit Type: OFFICE VISIT  Date: 02/06/2023  Medication: amLODipine (NORVASC) 10 MG tablet and triamcinolone cream (KENALOG) 0.1 %   Has the patient contacted their pharmacy? Yes Call provider for refills  Is this the correct pharmacy for this prescription? Yes  This is the patient's preferred pharmacy:  [id number] - [place] ([place]), [place] - [address] [place] [address] Phone: [phone] Fax: [phone]   Has the prescription been filled recently? Yes  Is the patient out of the medication? Yes  Has the patient been seen for an appointment in the last year OR does the patient have an upcoming appointment? Yes  Can we respond through MyChart? No  Agent: Please be advised that Rx refills may take up to 3 business days. We ask that you follow-up with your pharmacy.  Patient has been out of medication for 3 days

## 2023-10-23 NOTE — Telephone Encounter (Signed)
Requested medication (s) are due for refill today - yes  Requested medication (s) are on the active medication list -yes  Future visit scheduled -yes  Last refill: 07/09/23 45g  Notes to clinic: non delegated Rx  Requested Prescriptions  Pending Prescriptions Disp Refills   triamcinolone cream (KENALOG) 0.1 % 45 g 0    Sig: APPLY CREAM EXTERNALLY TWICE DAILY     Not Delegated - Dermatology:  Corticosteroids Failed - 10/23/2023  1:44 PM      Failed - This refill cannot be delegated      Passed - Valid encounter within last 12 months    Recent Outpatient Visits           6 months ago Tinea pedis of both feet   [place] [employer] Comm Health Wellnss - A Dept Of [place]. [facility] Hoy Register, MD   8 months ago Screening for diabetes mellitus   [place] Comm Health [place] - A Dept Of [place]. [facility] Hoy Register, MD   10 months ago Attention deficit   [place] Comm Health Icehouse [place] - A Dept Of [place]. [facility] Hoy Register, MD   11 months ago CKD (chronic kidney disease) stage 5, GFR less than 15 ml/min (HCC)   [place] Comm Health Merry Proud - A Dept Of [place]. [facility] [place], Marzella Schlein, [place]   1 year ago Chronic kidney disease (CKD), stage V [facility])   [place] Comm Health Merry Proud - A Dept Of [place]. [facility] Hoy Register, MD       Future Appointments             In 4 weeks Sharon Seller, [place] Organ [facility] Health Comm Health Merry Proud - A Dept Of Eligha Bridegroom. [facility]            Signed Prescriptions Disp Refills   amLODipine (NORVASC) 10 MG tablet 30 tablet 0    Sig: Take 1 tablet (10 mg total) by mouth daily.     Cardiovascular: Calcium Channel Blockers 2 Failed - 10/23/2023  1:44 PM      Failed - Valid encounter within last 6 months    Recent Outpatient Visits           6 months ago Tinea pedis of both feet   [place] Comm Health Wellnss - A Dept Of [place]. [facility] Hoy Register, MD   8 months ago Screening for diabetes mellitus   [place] Comm Health [place] - A Dept Of [place]. [facility] Hoy Register, MD   10 months ago Attention deficit   [place] Comm Health Wil[place] - A Dept Of [place]. [facility] Hoy Register, MD   11 months ago CKD (chronic kidney disease) stage 5, GFR less than 15 ml/min (HCC)   [place] Comm Health Merry Proud - A Dept Of [place]. [facility] [place], Marzella Schlein, [place]   1 year ago Chronic kidney disease (CKD), stage V [facility])   [place] Comm Health Merry Proud - A Dept Of . [facility] Hoy Register, MD       Future Appointments             In 4 weeks Sharon Seller, [place] Organ [facility] Health Comm Health Merry Proud - A Dept Of Eligha Bridegroom. [facility]            Passed - Last BP  in normal range    BP Readings from Last 1 Encounters:  09/27/23 127/85         Passed - Last Heart Rate in normal range    Pulse Readings from Last 1 Encounters:  09/27/23 76            Requested Prescriptions  Pending Prescriptions Disp Refills   triamcinolone cream (KENALOG) 0.1 % 45 g 0    Sig: APPLY CREAM EXTERNALLY TWICE DAILY     Not Delegated - Dermatology:  Corticosteroids Failed - 10/23/2023  1:44 PM      Failed - This refill cannot be delegated      Passed - Valid encounter within last 12 months    Recent Outpatient Visits           6 months ago Tinea pedis of both feet   [place] Comm Health Wellnss - A Dept Of [place]. [facility] Hoy Register, MD   8 months ago Screening for diabetes mellitus   [place] Comm Health [place] - A Dept Of [place]. [facility] Hoy Register, MD   10 months ago Attention deficit   [place] Comm Health Brook[place] - A Dept Of [place]. [place]. [facility] Hoy Register, MD   11 months ago CKD (chronic kidney disease) stage 5, GFR less than 15 ml/min (HCC)   [place]  Comm Health Merry Proud - A Dept Of [place]. [facility] [place], Marzella Schlein, [place]   1 year ago Chronic kidney disease (CKD), stage V [facility])   [place] Comm Health Merry Proud - A Dept Of [place]. [facility] Hoy Register, MD       Future Appointments             In 4 weeks Sharon Seller, [place] Organ [facility] Health Comm Health Merry Proud - A Dept Of Eligha Bridegroom. [facility]            Signed Prescriptions Disp Refills   amLODipine (NORVASC) 10 MG tablet 30 tablet 0    Sig: Take 1 tablet (10 mg total) by mouth daily.     Cardiovascular: Calcium Channel Blockers 2 Failed - 10/23/2023  1:44 PM      Failed - Valid encounter within last 6 months    Recent Outpatient Visits           6 months ago Tinea pedis of both feet   [place] Comm Health Wellnss - A Dept Of [place]. [facility] Hoy Register, MD   8 months ago Screening for diabetes mellitus   [place] Comm Health [place] - A Dept Of [place]. [facility] Hoy Register, MD   10 months ago Attention deficit   [place] Comm Health [place] - A Dept Of [place]. [facility] Hoy Register, MD   11 months ago CKD (chronic kidney disease) stage 5, GFR less than 15 ml/min (HCC)   [place] Comm Health Merry Proud - A Dept Of [place]. [facility] [place], Marzella Schlein, [place]   1 year ago Chronic kidney disease (CKD), stage V [place])    Comm Health Merry Proud - A Dept Of [place]. [facility] Hoy Register, MD       Future Appointments             In 4 weeks Sharon Seller, [place] Organ [facility] Health Comm Health Merry Proud - A Dept Of Eligha Bridegroom. [facility]  Passed - Last BP in normal range    BP Readings from Last 1 Encounters:  09/27/23 127/85         Passed - Last Heart Rate in normal range    Pulse Readings from Last 1 Encounters:  09/27/23 76

## 2023-10-23 NOTE — Telephone Encounter (Signed)
Requested medication (s) are due for refill today: yes  Requested medication (s) are on the active medication list: yes  Last refill:  09/17/23 #30  Future visit scheduled: yes  Notes to clinic:  overdue lab work. Called pt appt made.    Requested Prescriptions  Pending Prescriptions Disp Refills   atorvastatin (LIPITOR) 40 MG tablet 30 tablet 0     Cardiovascular:  Antilipid - Statins Failed - 10/23/2023  1:33 PM      Failed - Lipid Panel in normal range within the last 12 months    Cholesterol, Total  Date Value Ref Range Status  11/10/2018 214 (H) 100 - 199 mg/dL Final   Cholesterol  Date Value Ref Range Status  05/24/2022 123 0 - 200 mg/dL Final   LDL Calculated  Date Value Ref Range Status  11/10/2018 102 (H) 0 - 99 mg/dL Final   LDL Cholesterol  Date Value Ref Range Status  05/24/2022 49 0 - 99 mg/dL Final    Comment:           Total Cholesterol/HDL:CHD Risk Coronary Heart Disease Risk Table                     Men   Women  1/2 Average Risk   3.4   3.3  Average Risk       5.0   4.4  2 X Average Risk   9.6   7.1  3 X Average Risk  23.4   11.0        Use the calculated Patient Ratio above and the CHD Risk Table to determine the patient's CHD Risk.        ATP III CLASSIFICATION (LDL):  <100     mg/dL   Optimal  578-469  mg/dL   Near or Above                    Optimal  130-159  mg/dL   Borderline  629-528  mg/dL   High  >413     mg/dL   Very High Performed at [facility], [address], [place], [place] [address]    HDL  Date Value Ref Range Status  05/24/2022 65 >40 mg/dL Final  02/72/5366 96 >44 mg/dL Final   Triglycerides  Date Value Ref Range Status  05/24/2022 44 <150 mg/dL Final         Passed - Patient is not pregnant      Passed - Valid encounter within last 12 months    Recent Outpatient Visits           6 months ago Tinea pedis of both feet   [place] Comm Health Wellnss - A Dept Of [place]. [facility]  Hoy Register, MD   8 months ago Screening for diabetes mellitus   [place] Comm Health Bre[place]en[place] - A Dept Of [place]. [facility] Hoy Register, MD   10 months ago Attention deficit   [place] Comm Health Huron - A Dept Of [place]. [facility] Hoy Register, MD   11 months ago CKD (chronic kidney disease) stage 5, GFR less than 15 ml/min (HCC)   [place] Comm Health Merry Proud - A Dept Of [place]. [facility], Marzella Schlein, [place]   1 year ago Chronic kidney disease (CKD), stage V [facility])   [place] Comm Health Merry Proud - A Dept Of [place]. [facility] [place], [place],  MD       Future Appointments             In 4 weeks McClung, [place] Organ [place] Comm Health [place] - A Dept Of Eligha Bridegroom. [facility]

## 2023-10-23 NOTE — Telephone Encounter (Signed)
Appointment scheduled 11/19/22- will give RF Requested Prescriptions  Pending Prescriptions Disp Refills   amLODipine (NORVASC) 10 MG tablet 30 tablet 0    Sig: Take 1 tablet (10 mg total) by mouth daily.     Cardiovascular: Calcium Channel Blockers 2 Failed - 10/23/2023  1:36 PM      Failed - Valid encounter within last 6 months    Recent Outpatient Visits           6 months ago Tinea pedis of both feet   [place] Comm Health Wellnss - A Dept Of [place]. [facility] Hoy Register, MD   8 months ago Screening for diabetes mellitus   [place] Comm Health [place] - A Dept Of [place]. [facility] Hoy Register, MD   10 months ago Attention deficit   [place] Comm Health Mil[place] - A Dept Of [place]. [facility] Hoy Register, MD   11 months ago CKD (chronic kidney disease) stage 5, GFR less than 15 ml/min (HCC)   [place] Comm Health Merry Proud - A Dept Of [place]. [facility] [place], Marzella Schlein, [place]   1 year ago Chronic kidney disease (CKD), stage V [facility])   [place] Comm Health Merry Proud - A Dept Of [place].  [place]. [facility] Hoy Register, MD       Future Appointments             In 4 weeks Sharon Seller, [place] Organ [facility] Health Comm Health Merry Proud - A Dept Of Eligha Bridegroom. [facility]            Passed - Last BP in normal range    BP Readings from Last 1 Encounters:  09/27/23 127/85         Passed - Last Heart Rate in normal range    Pulse Readings from Last 1 Encounters:  09/27/23 76          triamcinolone cream (KENALOG) 0.1 % 45 g 0    Sig: APPLY CREAM EXTERNALLY TWICE DAILY     Not Delegated - Dermatology:  Corticosteroids Failed - 10/23/2023  1:36 PM      Failed - This refill cannot be delegated      Passed - Valid encounter within last 12 months    Recent Outpatient Visits           6 months ago Tinea pedis of both feet   [place] Comm Health Wellnss - A Dept Of [place]. [facility] Hoy Register, MD   8 months ago Screening for diabetes mellitus   [place] Comm Health U[place] - A Dept Of [place]. [facility] Hoy Register, MD   10 months ago Attention deficit   [place] Comm Health [place] - A Dept Of [place]. [facility] Hoy Register, MD   11 months ago CKD (chronic kidney disease) stage 5, GFR less than 15 ml/min (HCC)   [place] Comm Health Merry Proud - A Dept Of [place]. [facility] [place], Marzella Schlein, [place]   1 year ago Chronic kidney disease (CKD), stage V [facility])    Comm Health Merry Proud - A Dept Of [place]. [facility] Hoy Register, MD       Future Appointments             In 4 weeks Sharon Seller, [place] Organ [facility] Health Comm Health Merry Proud - A Dept Of Eligha Bridegroom. [facility]

## 2023-10-23 NOTE — Telephone Encounter (Signed)
Patient called, no answer, mailbox is full, sent MyChart message to call the office to schedule OV in order to receive refills.

## 2023-10-23 NOTE — Telephone Encounter (Signed)
Medication Refill -  Most Recent Primary Care Visit:  Provider: Hoy Register  Department: [facility]  Visit Type: OFFICE VISIT  Date: 02/06/2023  Medication: atorvastatin (LIPITOR) 40 MG tablet   Has the patient contacted their pharmacy? Yes (Agent: If no, request that the patient contact the pharmacy for the refill. If patient does not wish to contact the pharmacy document the reason why and proceed with request.) (Agent: If yes, when and what did the pharmacy advise?)  Is this the correct pharmacy for this prescription? Yes If no, delete pharmacy and type the correct one.  This is the patient's preferred pharmacy:  [id number] - [place] - [address] [place] ([place]) [place] [address] Phone: [phone] Fax: [phone]   Has the prescription been filled recently? Yes  Is the patient out of the medication? Yes  Has the patient been seen for an appointment in the last year OR does the patient have an upcoming appointment? Yes  Can we respond through MyChart? Yes  Agent: Please be advised that Rx refills may take up to 3 business days. We ask that you follow-up with your pharmacy.

## 2023-10-25 ENCOUNTER — Encounter (HOSPITAL_COMMUNITY)
Admission: RE | Admit: 2023-10-25 | Discharge: 2023-10-25 | Disposition: A | Discharge status: Home / Self Care | Payer: MEDICAID | Source: Ambulatory Visit | Attending: Internal Medicine | Admitting: Internal Medicine

## 2023-10-25 ENCOUNTER — Encounter (HOSPITAL_COMMUNITY): Payer: MEDICAID

## 2023-10-25 VITALS — BP 140/87 | HR 81 | Temp 97.4°F | Resp 17

## 2023-10-25 DIAGNOSIS — N185 Chronic kidney disease, stage 5: Secondary | ICD-10-CM | POA: Insufficient documentation

## 2023-10-25 LAB — RENAL FUNCTION PANEL
Albumin: 3.3 g/dL — ABNORMAL LOW (ref 3.5–5.0)
Anion gap: 14 (ref 5–15)
BUN: 114 mg/dL — ABNORMAL HIGH (ref 6–20)
CO2: 20 mmol/L — ABNORMAL LOW (ref 22–32)
Calcium: 6.8 mg/dL — ABNORMAL LOW (ref 8.9–10.3)
Chloride: 101 mmol/L (ref 98–111)
Creatinine, Ser: 13.85 mg/dL — ABNORMAL HIGH (ref 0.61–1.24)
GFR, Estimated: 4 mL/min — ABNORMAL LOW (ref >60)
Glucose, Bld: 93 mg/dL (ref 70–99)
Phosphorus: 6 mg/dL — ABNORMAL HIGH (ref 2.5–4.6)
Potassium: 3.7 mmol/L (ref 3.5–5.1)
Sodium: 135 mmol/L (ref 135–145)

## 2023-10-25 LAB — IRON AND TIBC
Iron: 115 ug/dL (ref 45–182)
Saturation Ratios: 46 % — ABNORMAL HIGH (ref 17.9–39.5)
TIBC: 249 ug/dL — ABNORMAL LOW (ref 250–450)
UIBC: 134 ug/dL

## 2023-10-25 LAB — FERRITIN: Ferritin: 667 ng/mL — ABNORMAL HIGH (ref 24–336)

## 2023-10-25 LAB — POCT HEMOGLOBIN-HEMACUE: Hemoglobin: 7.3 g/dL — ABNORMAL LOW (ref 13.0–17.0)

## 2023-10-25 MED ORDER — EPOETIN ALFA-EPBX 40000 UNIT/ML IJ SOLN
INTRAMUSCULAR | Status: AC
  Filled 2023-10-25: qty 1

## 2023-10-25 MED ORDER — EPOETIN ALFA-EPBX 40000 UNIT/ML IJ SOLN
30000.0000 [IU] | INTRAMUSCULAR | Status: DC
Start: 2023-10-25 — End: 2024-11-06
  Administered 2023-10-25: 30000 [IU] via SUBCUTANEOUS

## 2023-10-25 NOTE — Progress Notes (Signed)
Pt here for retacrit injection after missing last appt.  HGB 7.3 via hemocue.  Fatigue is only complaint, no s/s of bleeding, chest pain or shortness of breath.  Dr Valentino Nose' office notified.  Instructed to proceed with current orders

## 2023-10-27 LAB — PTH, INTACT AND CALCIUM
Calcium, Total (PTH): 6.7 mg/dL — CL (ref 8.6–10.2)
PTH: 160 pg/mL — ABNORMAL HIGH (ref 15–65)

## 2023-11-01 ENCOUNTER — Other Ambulatory Visit: Payer: Self-pay | Admitting: Family Medicine

## 2023-11-01 DIAGNOSIS — I10 Essential (primary) hypertension: Secondary | ICD-10-CM

## 2023-11-08 ENCOUNTER — Other Ambulatory Visit: Payer: Self-pay

## 2023-11-08 ENCOUNTER — Ambulatory Visit (HOSPITAL_COMMUNITY)
Admission: RE | Admit: 2023-11-08 | Discharge: 2023-11-08 | Disposition: A | Discharge status: Home / Self Care | Payer: MEDICAID | Attending: Nephrology | Admitting: Nephrology

## 2023-11-08 ENCOUNTER — Encounter (HOSPITAL_COMMUNITY)
Admission: RE | Disposition: A | Discharge status: Home / Self Care | Payer: Self-pay | Source: Home / Self Care | Attending: Nephrology

## 2023-11-08 ENCOUNTER — Encounter (HOSPITAL_COMMUNITY)
Admission: RE | Admit: 2023-11-08 | Discharge: 2023-11-08 | Disposition: A | Discharge status: Home / Self Care | Payer: MEDICAID | Source: Ambulatory Visit | Attending: Internal Medicine | Admitting: Internal Medicine

## 2023-11-08 VITALS — BP 120/80 | HR 74 | Temp 97.5°F | Resp 16

## 2023-11-08 DIAGNOSIS — N185 Chronic kidney disease, stage 5: Secondary | ICD-10-CM | POA: Diagnosis not present

## 2023-11-08 DIAGNOSIS — J449 Chronic obstructive pulmonary disease, unspecified: Secondary | ICD-10-CM | POA: Insufficient documentation

## 2023-11-08 DIAGNOSIS — D631 Anemia in chronic kidney disease: Secondary | ICD-10-CM | POA: Diagnosis not present

## 2023-11-08 DIAGNOSIS — I5032 Chronic diastolic (congestive) heart failure: Secondary | ICD-10-CM | POA: Insufficient documentation

## 2023-11-08 DIAGNOSIS — I132 Hypertensive heart and chronic kidney disease with heart failure and with stage 5 chronic kidney disease, or end stage renal disease: Secondary | ICD-10-CM | POA: Diagnosis present

## 2023-11-08 DIAGNOSIS — Z992 Dependence on renal dialysis: Secondary | ICD-10-CM | POA: Diagnosis not present

## 2023-11-08 DIAGNOSIS — Z87891 Personal history of nicotine dependence: Secondary | ICD-10-CM | POA: Diagnosis not present

## 2023-11-08 DIAGNOSIS — N186 End stage renal disease: Secondary | ICD-10-CM | POA: Diagnosis not present

## 2023-11-08 DIAGNOSIS — G4733 Obstructive sleep apnea (adult) (pediatric): Secondary | ICD-10-CM | POA: Insufficient documentation

## 2023-11-08 HISTORY — PX: A/V FISTULAGRAM: CATH118298

## 2023-11-08 SURGERY — A/V FISTULAGRAM
Anesthesia: LOCAL

## 2023-11-08 MED ORDER — SODIUM CHLORIDE 0.9 % IV SOLN: INTRAVENOUS | Status: DC

## 2023-11-08 MED ORDER — LIDOCAINE HCL (PF) 1 % IJ SOLN
INTRAMUSCULAR | Status: DC | PRN
  Administered 2023-11-08: 3 mL

## 2023-11-08 MED ORDER — EPOETIN ALFA-EPBX 40000 UNIT/ML IJ SOLN
30000.0000 [IU] | INTRAMUSCULAR | Status: DC
  Administered 2023-11-08: 30000 [IU] via SUBCUTANEOUS

## 2023-11-08 MED ORDER — EPOETIN ALFA-EPBX 40000 UNIT/ML IJ SOLN
INTRAMUSCULAR | Status: AC
  Filled 2023-11-08: qty 1

## 2023-11-08 MED ORDER — LIDOCAINE HCL (PF) 1 % IJ SOLN
INTRAMUSCULAR | Status: AC
Start: 2023-11-08 — End: ?
  Filled 2023-11-08: qty 30

## 2023-11-08 MED ORDER — IODIXANOL 320 MG/ML IV SOLN
INTRAVENOUS | Status: DC | PRN
  Administered 2023-11-08: 5 mL

## 2023-11-08 SURGICAL SUPPLY — 6 items
BAG SNAP BAND KOVER 36X36 (MISCELLANEOUS) ×2 IMPLANT
COVER DOME SNAP 22 D (MISCELLANEOUS) ×2 IMPLANT
KIT MICROPUNCTURE NIT STIFF (SHEATH) IMPLANT
PROTECTION STATION PRESSURIZED (MISCELLANEOUS) ×1
STATION PROTECTION PRESSURIZED (MISCELLANEOUS) IMPLANT
TRAY PV CATH (CUSTOM PROCEDURE TRAY) ×2 IMPLANT

## 2023-11-08 NOTE — H&P (Signed)
 Johnny [name] is an 63 y.o. male.   Chief Complaint: Fistula evaluation HPI:  63 year old man with past medical history significant for hypertension, hepatitis C infection, obstructive sleep apnea, COPD, HFpEF and chronic kidney disease stage V who is nearing the need to start dialysis.  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  He denies any prior endovascular procedure to this access.  He denies any constitutional complaints including fever and chills.  He denies any chest pain or shortness of breath and reports a recent mild reduction in appetite without nausea, vomiting or diarrhea.  The procedure was explained to him and he is willing to proceed.  Past Medical History:  Diagnosis Date   Adenomatous colon polyp    tubular   Anemia    Asthma    as a child   CHF (congestive heart failure) (HCC)    Chronic kidney disease    COPD (chronic obstructive pulmonary disease) (HCC)    Diverticulosis    External otitis of right ear 11/10/2018   Gout    Hepatitis C    HLD (hyperlipidemia)    Hypertension    OSA (obstructive sleep apnea) 11/03/2015   Sleep apnea    Strep throat 01/2021   Thyroid  disorder     Past Surgical History:  Procedure Laterality Date   AV FISTULA PLACEMENT  09/12/2009   Left arm AVF    Family History  Problem Relation Age of Onset   Hypertension Mother    Diabetes Mother    Heart disease Mother    Alzheimer's disease Mother    Arthritis Mother    Anemia Father    Kidney disease Brother    Colon cancer Neg Hx    Colon polyps Neg Hx    Esophageal cancer Neg Hx    Rectal cancer Neg Hx    Stomach cancer Neg Hx    Social History:  reports that he quit smoking about 12 years ago. His smoking use included cigarettes. He has never used smokeless tobacco. He reports that he does not drink alcohol and does not use drugs.  Allergies:  Allergies   Allergen Reactions   Heparin      Pt reports he's not sure what type of reaction   Naproxen Other (See Comments)    Unknown reaction    Shellfish Allergy Itching    Medications Prior to Admission  Medication Sig Dispense Refill   albuterol  (VENTOLIN  HFA) 108 (90 Base) MCG/ACT inhaler Inhale 2 puffs into the lungs every 6 (six) hours as needed for wheezing or shortness of breath. 18 g 2   allopurinol  (ZYLOPRIM ) 100 MG tablet Take 1 tablet (100 mg total) by mouth daily. 90 tablet 1   amLODipine  (NORVASC ) 10 MG tablet Take 1 tablet (10 mg total) by mouth daily. 30 tablet 0   atorvastatin  (LIPITOR) 40 MG tablet Take 1 tablet (40 mg total) by mouth daily. 30 tablet 0   colchicine  0.6 MG tablet TAKE 2 TABLETS BY MOUTH AT THE ONSET OF A GOUT FLARE. MAY REPEAT 1 TABLET IN 1 HOUR IF SYMPTOMS PERSIST 30 tablet 0   hydrOXYzine  (ATARAX ) 25 MG tablet Take 1 tablet (25 mg total) by mouth at bedtime as needed. (Patient not taking: Reported on 07/25/2023) 30 tablet 1   ipratropium-albuterol  (DUONEB) 0.5-2.5 (3) MG/3ML SOLN Inhale 3 mLs into the lungs every 6 (six) hours as needed. 90 mL 0  labetalol  (NORMODYNE ) 300 MG tablet Take 1 tablet by mouth twice daily 60 tablet 0   levothyroxine  (SYNTHROID ) 88 MCG tablet Take 1 tablet (88 mcg total) by mouth daily before breakfast. Please make lab appointment. Levels were off in April and has not followed-up. 30 tablet 0   mometasone -formoterol  (DULERA ) 100-5 MCG/ACT AERO Inhale 2 puffs into the lungs 2 (two) times daily. 13 g 6   mupirocin  ointment (BACTROBAN ) 2 % Apply 1 Application topically 2 (two) times daily. 22 g 0   ondansetron  (ZOFRAN ) 4 MG tablet Take 1 tablet (4 mg total) by mouth every 6 (six) hours as needed for nausea or vomiting. 20 tablet 0   predniSONE  (DELTASONE ) 20 MG tablet Take 1 tablet by mouth once daily with breakfast 5 tablet 0   terbinafine  (LAMISIL  AT) 1 % cream Apply 1 Application topically 2 (two) times daily. 42 g 1   torsemide   (DEMADEX ) 100 MG tablet Take 1 tablet (100 mg total) by mouth daily. 30 tablet 0   triamcinolone  cream (KENALOG ) 0.1 % APPLY CREAM EXTERNALLY TWICE DAILY 45 g 0    No results found for this or any previous visit (from the past 48 hours). No results found.  Review of Systems  All other systems reviewed and are negative.   Blood pressure (!) 180/158, resp. rate 14, weight 81.6 kg, SpO2 99%. Physical Exam Vitals reviewed.  Constitutional:      Appearance: Normal appearance.  HENT:     Head: Normocephalic and atraumatic.     Right Ear: External ear normal.     Left Ear: External ear normal.     Nose: Nose normal.     Mouth/Throat:     Mouth: Mucous membranes are dry.     Pharynx: Oropharynx is clear.  Eyes:     General: No scleral icterus.    Extraocular Movements: Extraocular movements intact.     Conjunctiva/sclera: Conjunctivae normal.  Cardiovascular:     Rate and Rhythm: Normal rate and regular rhythm.     Pulses: Normal pulses.     Heart sounds: Normal heart sounds.  Pulmonary:     Effort: Pulmonary effort is normal.     Breath sounds: Normal breath sounds.  Abdominal:     General: Abdomen is flat. Bowel sounds are normal.     Palpations: Abdomen is soft.     Tenderness: There is no guarding.  Musculoskeletal:     Cervical back: Normal range of motion and neck supple.     Right lower leg: No edema.     Left lower leg: No edema.     Comments: Left brachiocephalic fistula, well-developed with at least 2 crisscrossing superficial cutaneous veins that are possibly collaterals.  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      Assessment/Plan 1.  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  Will perform a fistulogram to evaluate for  any problems amenable to endovascular intervention. 2.  Chronic kidney disease stage V: Appears to be developing subtle uremic signs and symptoms and nearing need to start dialysis. 3.  Hypertension: Blood pressure elevated at today's visit, will monitor with sedation during the procedure.  Resume antihypertensive therapy upon discharge. 4.  Anemia: He is getting ESA therapy for anemia of chronic kidney disease  at short stay.  Treatment to be transitioned to dialysis unit protocol upon initiating dialysis.  Gordy MARLA Blanch, MD 11/08/2023, 12:31 PM

## 2023-11-08 NOTE — Progress Notes (Signed)
 Suture removed from left arm. Site dressed with gauze and transparent dressing. No bleeding form site.

## 2023-11-08 NOTE — Progress Notes (Signed)
 Notified Amber with [facility] that HGB 7.3, they had just increased dose and patient stated he does not see any blood but is little winded at times and tired. Per [facility] we can proceed with current orders.

## 2023-11-08 NOTE — Discharge Instructions (Signed)
 Discharge patient at [facility] after removing access site suture.

## 2023-11-08 NOTE — Op Note (Signed)
 DATE OF SERVICE: 11/08/2023  PATIENT:  Johnny [name]  63 y.o. male  PRE-OPERATIVE DIAGNOSIS:  CKD 5  POST-OPERATIVE DIAGNOSIS:  Same  PROCEDURE:   Left arm fistulagram  SURGEON:   Gordy Blanch, MD  ASSISTANT:  Debby SAILOR. Magda, MD  ANESTHESIA:   local  ESTIMATED BLOOD LOSS: minimal  LOCAL MEDICATIONS USED:  LIDOCAINE    COUNTS: confirmed correct.  PATIENT DISPOSITION:  PACU - hemodynamically stable.   Delay start of Pharmacological VTE agent (>24hrs) due to surgical blood loss or risk of bleeding: no  INDICATION FOR PROCEDURE: Johnny [name] is a 63 y.o. male with CKD V. He had a fistula created many years ago. After careful discussion of risks, benefits, and alternatives the patient was offered fistulogram to evaluate if the fistula is ready to use.  The patient understood and wished to proceed.  OPERATIVE FINDINGS: widely patent fistula; no central venous stenosis; no stenosis of fistula in the shoulder. Large sidebranch in the arm that contributes to drainage.  DESCRIPTION OF PROCEDURE: After identification of the patient in the pre-operative holding area, the patient was transferred to the operating room. The patient was positioned supine on the operating room table. The groins was prepped and draped in standard fashion. A surgical pause was performed confirming correct patient, procedure, and operative location.  Micropuncture access was obtained in the left arm fistula. Micro sheath was placed using seldinger technique. Fistulagram was performed in stations. See above for details. After adequate imaging was obtained the sheath was removed and a stitch placed about the access with good hemostasis.   Upon completion of the case instrument and sharps counts were confirmed correct. The patient was transferred to the PACU in good condition. I was present for all portions of the procedure.  Debby SAILOR. Magda, MD [facility] Phone Number:  [phone] 11/08/2023 1:22 PM

## 2023-11-11 ENCOUNTER — Encounter (HOSPITAL_COMMUNITY): Payer: Self-pay | Admitting: Vascular Surgery

## 2023-11-11 LAB — POCT HEMOGLOBIN-HEMACUE: Hemoglobin: 7.3 g/dL — ABNORMAL LOW (ref 13.0–17.0)

## 2023-11-15 ENCOUNTER — Telehealth: Payer: Self-pay

## 2023-11-15 NOTE — Telephone Encounter (Signed)
 Copied from CRM [id number]. Topic: Referral - Question >> Nov 15, 2023 12:04 PM Marlow Baars wrote: Reason for CRM: Sherrie with [facility] called in to let the provider know they received a referral for Palliative Care for the patient and they will follow.

## 2023-11-18 NOTE — Telephone Encounter (Signed)
 FYI

## 2023-11-20 ENCOUNTER — Encounter: Payer: Self-pay | Admitting: Physician Assistant

## 2023-11-20 ENCOUNTER — Ambulatory Visit: Payer: MEDICAID | Attending: Physician Assistant | Admitting: Physician Assistant

## 2023-11-20 VITALS — BP 125/77 | HR 71 | Wt 185.6 lb

## 2023-11-20 DIAGNOSIS — E039 Hypothyroidism, unspecified: Secondary | ICD-10-CM | POA: Diagnosis not present

## 2023-11-20 DIAGNOSIS — Z139 Encounter for screening, unspecified: Secondary | ICD-10-CM | POA: Diagnosis not present

## 2023-11-20 DIAGNOSIS — E7800 Pure hypercholesterolemia, unspecified: Secondary | ICD-10-CM | POA: Diagnosis not present

## 2023-11-20 DIAGNOSIS — I10 Essential (primary) hypertension: Secondary | ICD-10-CM

## 2023-11-20 DIAGNOSIS — N186 End stage renal disease: Secondary | ICD-10-CM

## 2023-11-20 MED ORDER — ATORVASTATIN CALCIUM 40 MG PO TABS: 40.0000 mg | ORAL_TABLET | Freq: Every day | ORAL | 0 refills | Status: DC

## 2023-11-20 MED ORDER — LEVOTHYROXINE SODIUM 88 MCG PO TABS: 88.0000 ug | ORAL_TABLET | Freq: Every day | ORAL | 0 refills | Status: DC

## 2023-11-20 MED ORDER — AMLODIPINE BESYLATE 10 MG PO TABS: 10.0000 mg | ORAL_TABLET | Freq: Every day | ORAL | 0 refills | Status: DC

## 2023-11-20 NOTE — Patient Instructions (Signed)
 Call your kidney doctor so you can sign up for and try dialysis per our discussion

## 2023-11-20 NOTE — Progress Notes (Signed)
 Patient ID: Johnny [name], male   DOB: Jan 22, 1961, 63 y.o.   MRN: [id number]     Johnny [name], is a 63 y.o. male  VFI:[id number]  [id number]  DOB - 1961/03/11  Chief Complaint  Patient presents with   Medical Management of Chronic Issues       Subjective:   Johnny [name] is a 62 y.o. male here today for a follow up visit and med RF.  After recent fistula assessment 11/08/2023 and will start dialysis soon.  Prior to today's visit he has been refusing/declining dialysis.  But, after lengthy discussion, he says he is now willing to try it.  No new issues or concerns.  Fistula is patent.  Needs TSH checked.  Last level about 9 months ago.    From fistula assessment Assessment/Plan 1.  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  Will perform a fistulogram to evaluate for any problems amenable to endovascular intervention. 2.  Chronic kidney disease stage V: Appears to be developing subtle uremic signs and symptoms and nearing need to start dialysis. 3.  Hypertension: Blood pressure elevated at today's visit, will monitor with sedation during the procedure.  Resume antihypertensive therapy upon discharge. 4.  Anemia: He is getting ESA therapy for anemia of chronic kidney disease at short stay.  Treatment to be transitioned to dialysis unit protocol upon initiating dialysis.      No problems updated.  ALLERGIES: Allergies  Allergen Reactions   Heparin      Pt reports he's not sure what type of reaction   Naproxen Other (See Comments)    Unknown reaction    Shellfish Allergy Itching    PAST MEDICAL HISTORY: Past Medical History:  Diagnosis Date   Adenomatous colon polyp    tubular   Anemia    Asthma    as a child   CHF (congestive heart failure) (HCC)    Chronic kidney disease    COPD (chronic obstructive pulmonary disease) (HCC)     Diverticulosis    External otitis of right ear 11/10/2018   Gout    Hepatitis C    HLD (hyperlipidemia)    Hypertension    OSA (obstructive sleep apnea) 11/03/2015   Sleep apnea    Strep throat 01/2021   Thyroid  disorder     MEDICATIONS AT HOME: Prior to Admission medications   Medication Sig Start Date End Date Taking? Authorizing Provider  albuterol  (VENTOLIN  HFA) 108 (90 Base) MCG/ACT inhaler Inhale 2 puffs into the lungs every 6 (six) hours as needed for wheezing or shortness of breath. 12/01/21  Yes Ghimire, Letty Raya, MD  allopurinol  (ZYLOPRIM ) 100 MG tablet Take 1 tablet (100 mg total) by mouth daily. 02/06/23  Yes Newlin, Enobong, MD  colchicine  0.6 MG tablet TAKE 2 TABLETS BY MOUTH AT THE ONSET OF A GOUT FLARE. MAY REPEAT 1 TABLET IN 1 HOUR IF SYMPTOMS PERSIST 10/15/23  Yes Joaquin Mulberry, MD  hydrOXYzine  (ATARAX ) 25 MG tablet Take 1 tablet (25 mg total) by mouth at bedtime as needed. 02/06/23  Yes Newlin, Lavelle Posey, MD  ipratropium-albuterol  (DUONEB) 0.5-2.5 (3) MG/3ML SOLN Inhale 3 mLs into the lungs every 6 (six) hours as needed. 08/28/22  Yes Newlin, Enobong, MD  labetalol  (NORMODYNE ) 300 MG tablet Take 1 tablet by mouth twice daily 11/01/23  Yes Newlin, Enobong, MD  mometasone -formoterol  (DULERA ) 100-5 MCG/ACT AERO Inhale 2 puffs into the lungs 2 (two) times  daily. 11/07/22  Yes Hassie Lint, PA-C  mupirocin  ointment (BACTROBAN ) 2 % Apply 1 Application topically 2 (two) times daily. 11/07/22  Yes Hassie Lint, PA-C  ondansetron  (ZOFRAN ) 4 MG tablet Take 1 tablet (4 mg total) by mouth every 6 (six) hours as needed for nausea or vomiting. 05/24/23  Yes Onetha Bile, MD  predniSONE  (DELTASONE ) 20 MG tablet Take 1 tablet by mouth once daily with breakfast 04/23/23  Yes Newlin, Enobong, MD  terbinafine  (LAMISIL  AT) 1 % cream Apply 1 Application topically 2 (two) times daily. 04/15/23  Yes Newlin, Enobong, MD  triamcinolone  cream (KENALOG ) 0.1 % APPLY CREAM EXTERNALLY TWICE DAILY  10/23/23  Yes Newlin, Enobong, MD  amLODipine  (NORVASC ) 10 MG tablet Take 1 tablet (10 mg total) by mouth daily. 11/20/23   Hassie Lint, PA-C  atorvastatin  (LIPITOR) 40 MG tablet Take 1 tablet (40 mg total) by mouth daily. 11/20/23   Jeronda Don M, PA-C  levothyroxine  (SYNTHROID ) 88 MCG tablet Take 1 tablet (88 mcg total) by mouth daily before breakfast. Please make lab appointment. Levels were off in April and has not followed-up. 11/20/23   Hassie Lint, PA-C  torsemide  (DEMADEX ) 100 MG tablet Take 1 tablet (100 mg total) by mouth daily. 11/07/22 05/15/23  Hassie Lint, PA-C    ROS: Neg HEENT Neg resp Neg cardiac Neg GI Neg GU Neg MS Neg psych Neg neuro  Objective:   Vitals:   11/20/23 1515  BP: 125/77  Pulse: 71  SpO2: 99%  Weight: 185 lb 9.6 oz (84.2 kg)   Exam General appearance : Awake, alert, not in any distress. Speech Clear. Not toxic looking HEENT: Atraumatic and Normocephalic Neck: Supple, no JVD. No cervical lymphadenopathy.  Chest: Good air entry bilaterally, CTAB.  No rales/rhonchi/wheezing CVS: S1 S2 regular, no murmurs.  Extremities: B/L Lower Ext shows no edema, both legs are warm to touch Neurology: Awake alert, and oriented X 3, CN II-XII intact, Non focal Skin: No Rash  Data Review Lab Results  Component Value Date   HGBA1C 5.3 02/06/2023   HGBA1C 5.3 05/24/2022   HGBA1C  02/21/2009    5.3 (NOTE) The ADA recommends the following therapeutic goal for glycemic control related to Hgb A1c measurement: Goal of therapy: <6.5 Hgb A1c  Reference: American Diabetes Association: Clinical Practice Recommendations 2010, Diabetes Care, 2010, 33: (Suppl  1).    Assessment & Plan   1. Essential hypertension controlled - amLODipine  (NORVASC ) 10 MG tablet; Take 1 tablet (10 mg total) by mouth daily.  Dispense: 30 tablet; Refill: 0  2. Pure hypercholesterolemia - atorvastatin  (LIPITOR) 40 MG tablet; Take 1 tablet (40 mg total) by mouth daily.   Dispense: 30 tablet; Refill: 0  3. Acquired hypothyroidism Will assess - levothyroxine  (SYNTHROID ) 88 MCG tablet; Take 1 tablet (88 mcg total) by mouth daily before breakfast. Please make lab appointment. Levels were off in April and has not followed-up.  Dispense: 30 tablet; Refill: 0 - Thyroid  Panel With TSH  4. Encounter for screening involving social determinants of health (SDoH) (Primary) - AMB Referral VBCI Care Management  5. ESRD (end stage renal disease) [facility]) He has agreed he will reach out to Dr Cindra Cree and schedule starting dialysis.  We discussed dialysis and the many benefits of it at length.  Spent >30 mins face to face on this topic alone.      Return in about 3 months (around 02/18/2024) for PCP for chronic conditions-Newlin.  The patient was given clear instructions  to go to ER or return to medical center if symptoms don't improve, worsen or new problems develop. The patient verbalized understanding. The patient was told to call to get lab results if they haven't heard anything in the next week.      Dulce Gibbs, PA-C [facility] [place], [place] [phone]   11/20/2023, 4:17 PM

## 2023-11-21 ENCOUNTER — Other Ambulatory Visit: Payer: Self-pay | Admitting: Physician Assistant

## 2023-11-21 DIAGNOSIS — E039 Hypothyroidism, unspecified: Secondary | ICD-10-CM

## 2023-11-21 LAB — THYROID PANEL WITH TSH
Free Thyroxine Index: 2.8 (ref 1.2–4.9)
T3 Uptake Ratio: 31 % (ref 24–39)
T4, Total: 9 ug/dL (ref 4.5–12.0)
TSH: 4.74 u[IU]/mL — ABNORMAL HIGH (ref 0.450–4.500)

## 2023-11-21 MED ORDER — LEVOTHYROXINE SODIUM 88 MCG PO TABS: 88.0000 ug | ORAL_TABLET | Freq: Every day | ORAL | 1 refills | Status: DC

## 2023-11-22 ENCOUNTER — Telehealth: Payer: Self-pay

## 2023-11-22 ENCOUNTER — Encounter (HOSPITAL_COMMUNITY): Payer: MEDICAID

## 2023-11-22 NOTE — Telephone Encounter (Signed)
-----   Message from Georgian Co sent at 11/21/2023  8:43 AM EST ----- Thyroid levels are much improved.  The levothyroxine is the correct dose for now.  I sent you a new 90 prescription (I only sent 30 day yesterday).  Please call Dr Valentino Nose to get an appointment to see about getting on the dialysis schedule as we discussed.  Thanks, Georgian Co, PA-C

## 2023-11-22 NOTE — Telephone Encounter (Signed)
Pt was called and no vm was left due to mailbox being full. Information has been sent to nurse pool.  

## 2023-11-26 ENCOUNTER — Other Ambulatory Visit: Payer: Self-pay | Admitting: Licensed Clinical Social Worker

## 2023-11-26 ENCOUNTER — Telehealth: Payer: Self-pay

## 2023-11-26 NOTE — Telephone Encounter (Signed)
Pt was called and scheduled a mychart video visit to discuss nebulizer machine

## 2023-11-26 NOTE — Patient Instructions (Signed)
Johnny [name] ,   The [facility] Managed Care Team is available to provide assistance to you with your healthcare needs at no cost and as a benefit of your [facility] Health plan. I'm sorry I was unable to reach you today for our scheduled appointment. Our care guide will call you to reschedule our telephone appointment. Please call me at the number below. I am available to be of assistance to you regarding your healthcare needs. .   Thank you,   Dickie La, BSW, MSW, LCSW Licensed Clinical Social Worker [employer]   [email or web address] Direct Dial: [phone]

## 2023-11-26 NOTE — Telephone Encounter (Signed)
Copied from CRM [id number]. Topic: General - Inquiry >> Nov 26, 2023 12:13 PM Clide Dales wrote: Patient states that his nebulizer has stopped working and would like to know if he can get a new one. Please advise.

## 2023-11-26 NOTE — Patient Outreach (Signed)
  Medicaid Managed Care   Unsuccessful Attempt Note   11/26/2023 Name: Johnny [name] MRN: [id number] DOB: Dec 23, 1960  Referred by: Hoy Register, MD Reason for referral : No chief complaint on file.   An unsuccessful telephone outreach was attempted today. The patient was referred to the case management team for assistance with care management and care coordination.    Follow Up Plan: The Managed Medicaid care management team will reach out to the patient again over the next 30 days.   Dickie La, BSW, MSW, LCSW Licensed Clinical Social Worker [employer]   [email or web address] Direct Dial: [phone]

## 2023-11-27 ENCOUNTER — Telehealth: Payer: Self-pay | Admitting: Licensed Clinical Social Worker

## 2023-11-27 ENCOUNTER — Encounter: Payer: Self-pay | Admitting: Family Medicine

## 2023-11-27 ENCOUNTER — Telehealth (HOSPITAL_BASED_OUTPATIENT_CLINIC_OR_DEPARTMENT_OTHER): Payer: MEDICAID | Admitting: Family Medicine

## 2023-11-27 DIAGNOSIS — N186 End stage renal disease: Secondary | ICD-10-CM

## 2023-11-27 DIAGNOSIS — J432 Centrilobular emphysema: Secondary | ICD-10-CM

## 2023-11-27 MED ORDER — MISC. DEVICES MISC: 0 refills | Status: DC

## 2023-11-27 MED ORDER — IPRATROPIUM-ALBUTEROL 0.5-2.5 (3) MG/3ML IN SOLN: 3.0000 mL | Freq: Four times a day (QID) | RESPIRATORY_TRACT | 3 refills | Status: DC | PRN

## 2023-11-27 NOTE — Patient Outreach (Signed)
Care Coordination  11/27/2023  Johnny [name] 13-Feb-1961 [id number]  [facility] LCSW had missed call from patient yesterday during after office hours and attempted to outreach patient in order to reschedule missed appointment from yesterday but was not successful. [facility] LCSW was unable to leave a voice message. [facility] LCSW routed encounter to [facility] Scheduling Care Guide.   Dickie La, BSW, MSW, LCSW Licensed Clinical Social Worker [employer]   [facility] [email or web address] Direct Dial: [phone]

## 2023-11-27 NOTE — Progress Notes (Signed)
Virtual Visit via Video Note  I connected with Johnny [name], on 11/27/2023 at 9:28 AM by video enabled telemedicine device and verified that I am speaking with the correct person using two identifiers.   Consent: I discussed the limitations, risks, security and privacy concerns of performing an evaluation and management service by telemedicine and the availability of in person appointments. I also discussed with the patient that there may be a patient responsible charge related to this service. The patient expressed understanding and agreed to proceed.   Location of Patient: Home  Location of Provider: Clinic   Persons participating in Telemedicine visit: Johnny [name] Dr. Alvis Lemmings     History of Present Illness: Johnny [name] is a 63 y.o. year old male  with a history of  COPD, Gout, stage V chronic kidney disease, hypertension, sleep apnea, HFrEF (EF 55-60% from 09/2022).    Discussed the use of AI scribe software for clinical note transcription with the patient, who gave verbal consent to proceed.  He presents today for documentation to obtain a new nebulizer machine. He reports that he does not use the nebulizer regularly, but recently felt the need to use it and discovered it was not working. He uses the nebulizer during COPD flares. He has been experiencing some difficulty breathing at night and has been using his inhaler more frequently, which is not typical for him. He has Dulera for prevention, but has not been using it recently.  With regards to his CKD, he has been advised to start dialysis to , but has opted out at this time as he does not feel unwell. He is considering other options to help 'cleanse his blood'. He sees his nephrologist approximately every month and a half. He was previously receiving erythropoietin injections, but these have been discontinued and he is unsure why.     Past Medical History:  Diagnosis Date   Adenomatous colon polyp    tubular   Anemia     Asthma    as a child   CHF (congestive heart failure) (HCC)    Chronic kidney disease    COPD (chronic obstructive pulmonary disease) (HCC)    Diverticulosis    External otitis of right ear 11/10/2018   Gout    Hepatitis C    HLD (hyperlipidemia)    Hypertension    OSA (obstructive sleep apnea) 11/03/2015   Sleep apnea    Strep throat 01/2021   Thyroid disorder    Allergies  Allergen Reactions   Heparin     Pt reports he's not sure what type of reaction   Naproxen Other (See Comments)    Unknown reaction    Shellfish Allergy Itching    Current Outpatient Medications on File Prior to Visit  Medication Sig Dispense Refill   albuterol (VENTOLIN HFA) 108 (90 Base) MCG/ACT inhaler Inhale 2 puffs into the lungs every 6 (six) hours as needed for wheezing or shortness of breath. 18 g 2   allopurinol (ZYLOPRIM) 100 MG tablet Take 1 tablet (100 mg total) by mouth daily. 90 tablet 1   amLODipine (NORVASC) 10 MG tablet Take 1 tablet (10 mg total) by mouth daily. 30 tablet 0   atorvastatin (LIPITOR) 40 MG tablet Take 1 tablet (40 mg total) by mouth daily. 30 tablet 0   colchicine 0.6 MG tablet TAKE 2 TABLETS BY MOUTH AT THE ONSET OF A GOUT FLARE. MAY REPEAT 1 TABLET IN 1 HOUR IF SYMPTOMS PERSIST 30 tablet 0  hydrOXYzine (ATARAX) 25 MG tablet Take 1 tablet (25 mg total) by mouth at bedtime as needed. 30 tablet 1   ipratropium-albuterol (DUONEB) 0.5-2.5 (3) MG/3ML SOLN Inhale 3 mLs into the lungs every 6 (six) hours as needed. 90 mL 0   labetalol (NORMODYNE) 300 MG tablet Take 1 tablet by mouth twice daily 60 tablet 0   levothyroxine (SYNTHROID) 88 MCG tablet Take 1 tablet (88 mcg total) by mouth daily before breakfast. 90 tablet 1   mometasone-formoterol (DULERA) 100-5 MCG/ACT AERO Inhale 2 puffs into the lungs 2 (two) times daily. 13 g 6   mupirocin ointment (BACTROBAN) 2 % Apply 1 Application topically 2 (two) times daily. 22 g 0   ondansetron (ZOFRAN) 4 MG tablet Take 1 tablet (4 mg  total) by mouth every 6 (six) hours as needed for nausea or vomiting. 20 tablet 0   terbinafine (LAMISIL AT) 1 % cream Apply 1 Application topically 2 (two) times daily. 42 g 1   torsemide (DEMADEX) 100 MG tablet Take 1 tablet (100 mg total) by mouth daily. 30 tablet 0   triamcinolone cream (KENALOG) 0.1 % APPLY CREAM EXTERNALLY TWICE DAILY 45 g 0   No current facility-administered medications on file prior to visit.    ROS: See HPI  Observations/Objective: Awake, alert, oriented x3 Not in acute distress Normal mood      Latest Ref Rng & Units 10/25/2023    9:16 AM 09/27/2023    8:05 AM 08/09/2023    8:39 AM  CMP  Glucose 70 - 99 mg/dL 93  87  84   BUN 6 - 20 mg/dL 161  096  045   Creatinine 0.61 - 1.24 mg/dL 40.98  11.91  47.82   Sodium 135 - 145 mmol/L 135  138  134   Potassium 3.5 - 5.1 mmol/L 3.7  3.7  4.4   Chloride 98 - 111 mmol/L 101  102  100   CO2 22 - 32 mmol/L 20  18  19    Calcium 8.9 - 10.3 mg/dL 8.6 - 95.6 mg/dL 6.8    6.7  6.5    6.4  7.2    7.3     Lipid Panel     Component Value Date/Time   CHOL 123 05/24/2022 0341   CHOL 214 (H) 11/10/2018 1104   TRIG 44 05/24/2022 0341   HDL 65 05/24/2022 0341   HDL 96 11/10/2018 1104   CHOLHDL 1.9 05/24/2022 0341   VLDL 9 05/24/2022 0341   LDLCALC 49 05/24/2022 0341   LDLCALC 102 (H) 11/10/2018 1104   LABVLDL 16 11/10/2018 1104    Lab Results  Component Value Date   HGBA1C 5.3 02/06/2023     Assessment and Plan:     COPD/ Emphysema Increased shortness of breath at night. Nebulizer machine not working. Not currently using Dulera. -Order nebulizer machine from medical equipment company. -Prescribe Albuterol nebulizer solution to [employer]. -Advise to resume Dulera for prevention of COPD flares.  Chronic Kidney Disease/ ESRD Patient opted out of starting dialysis despite nephrologist's recommendation. Erythropoietin injections discontinued. -Continue monitoring with nephrologist, next  appointment scheduled for this Friday. -Discuss erythropoietin injections with nephrologist during next visit.        Follow Up Instructions: Keep previously scheduled appointment   I discussed the assessment and treatment plan with the patient. The patient was provided an opportunity to ask questions and all were answered. The patient agreed with the plan and demonstrated an understanding of the instructions.  The patient was advised to call back or seek an in-person evaluation if the symptoms worsen or if the condition fails to improve as anticipated.     I provided 11 minutes total of Telehealth time during this encounter including median intraservice time, reviewing previous notes, investigations, ordering medications, medical decision making, coordinating care and patient verbalized understanding at the end of the visit.     Hoy Register, MD, FAAFP. [facility] [place], [place] [phone]   11/27/2023, 9:28 AM

## 2023-11-28 ENCOUNTER — Other Ambulatory Visit: Payer: Self-pay

## 2023-11-28 ENCOUNTER — Encounter (HOSPITAL_COMMUNITY): Payer: Self-pay | Admitting: Emergency Medicine

## 2023-11-28 ENCOUNTER — Inpatient Hospital Stay (HOSPITAL_COMMUNITY)
Admission: EM | Admit: 2023-11-28 | Discharge: 2023-12-03 | DRG: 291 | Disposition: A | Discharge status: Home / Self Care | Payer: MEDICAID | Attending: Internal Medicine | Admitting: Internal Medicine

## 2023-11-28 ENCOUNTER — Emergency Department (HOSPITAL_COMMUNITY): Payer: MEDICAID

## 2023-11-28 DIAGNOSIS — Z79890 Hormone replacement therapy (postmenopausal): Secondary | ICD-10-CM

## 2023-11-28 DIAGNOSIS — I5033 Acute on chronic diastolic (congestive) heart failure: Principal | ICD-10-CM | POA: Diagnosis present

## 2023-11-28 DIAGNOSIS — J449 Chronic obstructive pulmonary disease, unspecified: Secondary | ICD-10-CM | POA: Diagnosis present

## 2023-11-28 DIAGNOSIS — I132 Hypertensive heart and chronic kidney disease with heart failure and with stage 5 chronic kidney disease, or end stage renal disease: Principal | ICD-10-CM | POA: Diagnosis present

## 2023-11-28 DIAGNOSIS — Z833 Family history of diabetes mellitus: Secondary | ICD-10-CM

## 2023-11-28 DIAGNOSIS — G4733 Obstructive sleep apnea (adult) (pediatric): Secondary | ICD-10-CM | POA: Diagnosis present

## 2023-11-28 DIAGNOSIS — I5030 Unspecified diastolic (congestive) heart failure: Secondary | ICD-10-CM

## 2023-11-28 DIAGNOSIS — D649 Anemia, unspecified: Secondary | ICD-10-CM

## 2023-11-28 DIAGNOSIS — Z888 Allergy status to other drugs, medicaments and biological substances status: Secondary | ICD-10-CM

## 2023-11-28 DIAGNOSIS — Z7951 Long term (current) use of inhaled steroids: Secondary | ICD-10-CM

## 2023-11-28 DIAGNOSIS — I5031 Acute diastolic (congestive) heart failure: Secondary | ICD-10-CM

## 2023-11-28 DIAGNOSIS — D631 Anemia in chronic kidney disease: Secondary | ICD-10-CM | POA: Diagnosis present

## 2023-11-28 DIAGNOSIS — I10 Essential (primary) hypertension: Secondary | ICD-10-CM | POA: Diagnosis present

## 2023-11-28 DIAGNOSIS — E875 Hyperkalemia: Secondary | ICD-10-CM | POA: Diagnosis present

## 2023-11-28 DIAGNOSIS — E785 Hyperlipidemia, unspecified: Secondary | ICD-10-CM | POA: Diagnosis present

## 2023-11-28 DIAGNOSIS — F419 Anxiety disorder, unspecified: Secondary | ICD-10-CM | POA: Diagnosis present

## 2023-11-28 DIAGNOSIS — Z79899 Other long term (current) drug therapy: Secondary | ICD-10-CM

## 2023-11-28 DIAGNOSIS — N186 End stage renal disease: Secondary | ICD-10-CM | POA: Diagnosis present

## 2023-11-28 DIAGNOSIS — F32A Depression, unspecified: Secondary | ICD-10-CM | POA: Diagnosis present

## 2023-11-28 DIAGNOSIS — Z91013 Allergy to seafood: Secondary | ICD-10-CM

## 2023-11-28 DIAGNOSIS — Z820 Family history of epilepsy and other diseases of the nervous system: Secondary | ICD-10-CM

## 2023-11-28 DIAGNOSIS — Z886 Allergy status to analgesic agent status: Secondary | ICD-10-CM

## 2023-11-28 DIAGNOSIS — J4489 Other specified chronic obstructive pulmonary disease: Secondary | ICD-10-CM | POA: Diagnosis present

## 2023-11-28 DIAGNOSIS — Z992 Dependence on renal dialysis: Secondary | ICD-10-CM

## 2023-11-28 DIAGNOSIS — N250 Renal osteodystrophy: Secondary | ICD-10-CM | POA: Diagnosis present

## 2023-11-28 DIAGNOSIS — M109 Gout, unspecified: Secondary | ICD-10-CM | POA: Diagnosis present

## 2023-11-28 DIAGNOSIS — Z91158 Patient's noncompliance with renal dialysis for other reason: Secondary | ICD-10-CM

## 2023-11-28 DIAGNOSIS — Z8261 Family history of arthritis: Secondary | ICD-10-CM

## 2023-11-28 DIAGNOSIS — Z860101 Personal history of adenomatous and serrated colon polyps: Secondary | ICD-10-CM

## 2023-11-28 DIAGNOSIS — N185 Chronic kidney disease, stage 5: Secondary | ICD-10-CM | POA: Diagnosis present

## 2023-11-28 DIAGNOSIS — Z8249 Family history of ischemic heart disease and other diseases of the circulatory system: Secondary | ICD-10-CM

## 2023-11-28 DIAGNOSIS — Z87891 Personal history of nicotine dependence: Secondary | ICD-10-CM

## 2023-11-28 DIAGNOSIS — Z841 Family history of disorders of kidney and ureter: Secondary | ICD-10-CM

## 2023-11-28 DIAGNOSIS — I4891 Unspecified atrial fibrillation: Secondary | ICD-10-CM | POA: Diagnosis not present

## 2023-11-28 DIAGNOSIS — E039 Hypothyroidism, unspecified: Secondary | ICD-10-CM | POA: Diagnosis present

## 2023-11-28 NOTE — ED Triage Notes (Signed)
  Patient comes in with SOB that has been going on for about 3 days.  Patient has hx of CHF and stage 3 CKD.  Patient states this has happened before and he required diuresis.  Patient denies dyspnea but states it takes more effort when lying down.  Lung sounds clear in triage.

## 2023-11-29 ENCOUNTER — Inpatient Hospital Stay (HOSPITAL_COMMUNITY): Payer: MEDICAID

## 2023-11-29 DIAGNOSIS — Z87891 Personal history of nicotine dependence: Secondary | ICD-10-CM | POA: Diagnosis not present

## 2023-11-29 DIAGNOSIS — Z8249 Family history of ischemic heart disease and other diseases of the circulatory system: Secondary | ICD-10-CM | POA: Diagnosis not present

## 2023-11-29 DIAGNOSIS — N250 Renal osteodystrophy: Secondary | ICD-10-CM | POA: Diagnosis present

## 2023-11-29 DIAGNOSIS — M1A30X0 Chronic gout due to renal impairment, unspecified site, without tophus (tophi): Secondary | ICD-10-CM

## 2023-11-29 DIAGNOSIS — Z7951 Long term (current) use of inhaled steroids: Secondary | ICD-10-CM | POA: Diagnosis not present

## 2023-11-29 DIAGNOSIS — N186 End stage renal disease: Secondary | ICD-10-CM | POA: Diagnosis present

## 2023-11-29 DIAGNOSIS — G4733 Obstructive sleep apnea (adult) (pediatric): Secondary | ICD-10-CM | POA: Diagnosis present

## 2023-11-29 DIAGNOSIS — D631 Anemia in chronic kidney disease: Secondary | ICD-10-CM | POA: Diagnosis present

## 2023-11-29 DIAGNOSIS — D649 Anemia, unspecified: Secondary | ICD-10-CM | POA: Diagnosis not present

## 2023-11-29 DIAGNOSIS — E785 Hyperlipidemia, unspecified: Secondary | ICD-10-CM | POA: Diagnosis present

## 2023-11-29 DIAGNOSIS — Z860101 Personal history of adenomatous and serrated colon polyps: Secondary | ICD-10-CM | POA: Diagnosis not present

## 2023-11-29 DIAGNOSIS — I5031 Acute diastolic (congestive) heart failure: Secondary | ICD-10-CM

## 2023-11-29 DIAGNOSIS — F419 Anxiety disorder, unspecified: Secondary | ICD-10-CM | POA: Diagnosis present

## 2023-11-29 DIAGNOSIS — E875 Hyperkalemia: Secondary | ICD-10-CM | POA: Diagnosis present

## 2023-11-29 DIAGNOSIS — I5030 Unspecified diastolic (congestive) heart failure: Secondary | ICD-10-CM

## 2023-11-29 DIAGNOSIS — I10 Essential (primary) hypertension: Secondary | ICD-10-CM | POA: Diagnosis not present

## 2023-11-29 DIAGNOSIS — Z992 Dependence on renal dialysis: Secondary | ICD-10-CM | POA: Diagnosis not present

## 2023-11-29 DIAGNOSIS — Z841 Family history of disorders of kidney and ureter: Secondary | ICD-10-CM | POA: Diagnosis not present

## 2023-11-29 DIAGNOSIS — I4891 Unspecified atrial fibrillation: Secondary | ICD-10-CM | POA: Diagnosis not present

## 2023-11-29 DIAGNOSIS — E039 Hypothyroidism, unspecified: Secondary | ICD-10-CM | POA: Diagnosis present

## 2023-11-29 DIAGNOSIS — J4489 Other specified chronic obstructive pulmonary disease: Secondary | ICD-10-CM | POA: Diagnosis present

## 2023-11-29 DIAGNOSIS — I5033 Acute on chronic diastolic (congestive) heart failure: Secondary | ICD-10-CM | POA: Diagnosis not present

## 2023-11-29 DIAGNOSIS — M109 Gout, unspecified: Secondary | ICD-10-CM | POA: Diagnosis present

## 2023-11-29 DIAGNOSIS — I132 Hypertensive heart and chronic kidney disease with heart failure and with stage 5 chronic kidney disease, or end stage renal disease: Secondary | ICD-10-CM | POA: Diagnosis present

## 2023-11-29 DIAGNOSIS — Z820 Family history of epilepsy and other diseases of the nervous system: Secondary | ICD-10-CM | POA: Diagnosis not present

## 2023-11-29 DIAGNOSIS — Z91013 Allergy to seafood: Secondary | ICD-10-CM | POA: Diagnosis not present

## 2023-11-29 DIAGNOSIS — Z79890 Hormone replacement therapy (postmenopausal): Secondary | ICD-10-CM | POA: Diagnosis not present

## 2023-11-29 DIAGNOSIS — Z8261 Family history of arthritis: Secondary | ICD-10-CM | POA: Diagnosis not present

## 2023-11-29 DIAGNOSIS — Z833 Family history of diabetes mellitus: Secondary | ICD-10-CM | POA: Diagnosis not present

## 2023-11-29 LAB — BRAIN NATRIURETIC PEPTIDE: B Natriuretic Peptide: 2733.3 pg/mL — ABNORMAL HIGH (ref 0.0–100.0)

## 2023-11-29 LAB — IRON AND TIBC
Iron: 58 ug/dL (ref 45–182)
Saturation Ratios: 21 % (ref 17.9–39.5)
TIBC: 273 ug/dL (ref 250–450)
UIBC: 215 ug/dL

## 2023-11-29 LAB — CBC WITH DIFFERENTIAL/PLATELET
Abs Immature Granulocytes: 0.03 10*3/uL (ref 0.00–0.07)
Abs Immature Granulocytes: 0 10*3/uL (ref 0.00–0.07)
Basophils Absolute: 0 10*3/uL (ref 0.0–0.1)
Basophils Absolute: 0.1 10*3/uL (ref 0.0–0.1)
Basophils Relative: 1 %
Basophils Relative: 1 %
Eosinophils Absolute: 0.3 10*3/uL (ref 0.0–0.5)
Eosinophils Absolute: 0.3 10*3/uL (ref 0.0–0.5)
Eosinophils Relative: 4 %
Eosinophils Relative: 3 %
HCT: 19.9 % — ABNORMAL LOW (ref 39.0–52.0)
HCT: 21.8 % — ABNORMAL LOW (ref 39.0–52.0)
Hemoglobin: 6.5 g/dL — CL (ref 13.0–17.0)
Hemoglobin: 7.3 g/dL — ABNORMAL LOW (ref 13.0–17.0)
Immature Granulocytes: 0 %
Lymphocytes Relative: 21 %
Lymphocytes Relative: 8 %
Lymphs Abs: 1.5 10*3/uL (ref 0.7–4.0)
Lymphs Abs: 0.7 10*3/uL (ref 0.7–4.0)
MCH: 23.1 pg — ABNORMAL LOW (ref 26.0–34.0)
MCH: 24.3 pg — ABNORMAL LOW (ref 26.0–34.0)
MCHC: 32.7 g/dL (ref 30.0–36.0)
MCHC: 33.5 g/dL (ref 30.0–36.0)
MCV: 70.8 fL — ABNORMAL LOW (ref 80.0–100.0)
MCV: 72.7 fL — ABNORMAL LOW (ref 80.0–100.0)
Monocytes Absolute: 0.6 10*3/uL (ref 0.1–1.0)
Monocytes Absolute: 0.2 10*3/uL (ref 0.1–1.0)
Monocytes Relative: 9 %
Monocytes Relative: 2 %
Neutro Abs: 4.6 10*3/uL (ref 1.7–7.7)
Neutro Abs: 7.6 10*3/uL (ref 1.7–7.7)
Neutrophils Relative %: 65 %
Neutrophils Relative %: 86 %
Platelets: 141 10*3/uL — ABNORMAL LOW (ref 150–400)
Platelets: 135 10*3/uL — ABNORMAL LOW (ref 150–400)
RBC: 2.81 MIL/uL — ABNORMAL LOW (ref 4.22–5.81)
RBC: 3 MIL/uL — ABNORMAL LOW (ref 4.22–5.81)
RDW: 22.1 % — ABNORMAL HIGH (ref 11.5–15.5)
RDW: 22.8 % — ABNORMAL HIGH (ref 11.5–15.5)
WBC: 7.1 10*3/uL (ref 4.0–10.5)
WBC: 8.8 10*3/uL (ref 4.0–10.5)
nRBC: 0 % (ref 0.0–0.2)
nRBC: 0 % (ref 0.0–0.2)
nRBC: 0 /100{WBCs}

## 2023-11-29 LAB — HEPATITIS B SURFACE ANTIGEN: Hepatitis B Surface Ag: NONREACTIVE

## 2023-11-29 LAB — HIV ANTIBODY (ROUTINE TESTING W REFLEX): HIV Screen 4th Generation wRfx: NONREACTIVE

## 2023-11-29 LAB — COMPREHENSIVE METABOLIC PANEL
ALT: 18 U/L (ref 0–44)
AST: 24 U/L (ref 15–41)
Albumin: 3.3 g/dL — ABNORMAL LOW (ref 3.5–5.0)
Alkaline Phosphatase: 59 U/L (ref 38–126)
Anion gap: 12 (ref 5–15)
BUN: 103 mg/dL — ABNORMAL HIGH (ref 6–20)
CO2: 22 mmol/L (ref 22–32)
Calcium: 7.9 mg/dL — ABNORMAL LOW (ref 8.9–10.3)
Chloride: 102 mmol/L (ref 98–111)
Creatinine, Ser: 14.9 mg/dL — ABNORMAL HIGH (ref 0.61–1.24)
GFR, Estimated: 3 mL/min — ABNORMAL LOW (ref >60)
Glucose, Bld: 100 mg/dL — ABNORMAL HIGH (ref 70–99)
Potassium: 5.4 mmol/L — ABNORMAL HIGH (ref 3.5–5.1)
Sodium: 136 mmol/L (ref 135–145)
Total Bilirubin: 0.9 mg/dL (ref 0.0–1.2)
Total Protein: 7.2 g/dL (ref 6.5–8.1)

## 2023-11-29 LAB — ECHOCARDIOGRAM COMPLETE
AR max vel: 3.14 cm2
AV Peak grad: 9 mm[Hg]
Ao pk vel: 1.5 m/s
Area-P 1/2: 3.89 cm2
Height: 72 in
Weight: 2880 [oz_av]

## 2023-11-29 LAB — ABO/RH: ABO/RH(D): O POS

## 2023-11-29 LAB — RETIC PANEL
Immature Retic Fract: 6.3 % (ref 2.3–15.9)
RBC.: 1.19 MIL/uL — ABNORMAL LOW (ref 4.22–5.81)
Retic Count, Absolute: 7.4 10*3/uL — ABNORMAL LOW (ref 19.0–186.0)
Retic Ct Pct: 0.6 % (ref 0.4–3.1)
Reticulocyte Hemoglobin: 27.3 pg — ABNORMAL LOW (ref >27.9)

## 2023-11-29 LAB — POC OCCULT BLOOD, ED: Fecal Occult Blood: NEGATIVE

## 2023-11-29 LAB — PREPARE RBC (CROSSMATCH)

## 2023-11-29 LAB — MAGNESIUM: Magnesium: 1.9 mg/dL (ref 1.7–2.4)

## 2023-11-29 LAB — VITAMIN B12: Vitamin B-12: 626 pg/mL (ref 180–914)

## 2023-11-29 LAB — FOLATE: Folate: 15.5 ng/mL (ref >5.9)

## 2023-11-29 MED ORDER — LABETALOL HCL 200 MG PO TABS
300.0000 mg | ORAL_TABLET | Freq: Two times a day (BID) | ORAL | Status: DC
  Administered 2023-11-29 – 2023-12-03 (×8): 300 mg via ORAL
  Filled 2023-11-29: qty 2
  Filled 2023-11-29 (×7): qty 1

## 2023-11-29 MED ORDER — ACETAMINOPHEN 650 MG RE SUPP: 650.0000 mg | Freq: Four times a day (QID) | RECTAL | Status: DC | PRN

## 2023-11-29 MED ORDER — AMLODIPINE BESYLATE 10 MG PO TABS
10.0000 mg | ORAL_TABLET | Freq: Every day | ORAL | Status: DC
  Administered 2023-11-29 – 2023-12-02 (×4): 10 mg via ORAL
  Filled 2023-11-29 (×3): qty 1
  Filled 2023-11-29: qty 2

## 2023-11-29 MED ORDER — FUROSEMIDE 10 MG/ML IJ SOLN
80.0000 mg | Freq: Once | INTRAMUSCULAR | Status: AC
  Administered 2023-11-29: 80 mg via INTRAVENOUS
  Filled 2023-11-29: qty 8

## 2023-11-29 MED ORDER — LEVOTHYROXINE SODIUM 100 MCG PO TABS
100.0000 ug | ORAL_TABLET | Freq: Every day | ORAL | Status: DC
  Administered 2023-11-29 – 2023-12-03 (×5): 100 ug via ORAL
  Filled 2023-11-29 (×6): qty 1

## 2023-11-29 MED ORDER — CHLORHEXIDINE GLUCONATE CLOTH 2 % EX PADS
6.0000 | MEDICATED_PAD | Freq: Every day | CUTANEOUS | Status: DC
  Administered 2023-11-29 – 2023-12-03 (×5): 6 via TOPICAL

## 2023-11-29 MED ORDER — ACETAMINOPHEN 325 MG PO TABS
650.0000 mg | ORAL_TABLET | Freq: Four times a day (QID) | ORAL | Status: DC | PRN
  Filled 2023-11-29: qty 2

## 2023-11-29 MED ORDER — SODIUM CHLORIDE 0.9% IV SOLUTION: Freq: Once | INTRAVENOUS | Status: AC

## 2023-11-29 MED ORDER — MOMETASONE FURO-FORMOTEROL FUM 100-5 MCG/ACT IN AERO
2.0000 | INHALATION_SPRAY | Freq: Two times a day (BID) | RESPIRATORY_TRACT | Status: DC
  Administered 2023-11-29 – 2023-12-03 (×7): 2 via RESPIRATORY_TRACT
  Filled 2023-11-29: qty 8.8

## 2023-11-29 MED ORDER — ALLOPURINOL 100 MG PO TABS
100.0000 mg | ORAL_TABLET | Freq: Every day | ORAL | Status: DC
  Administered 2023-11-29 – 2023-12-03 (×5): 100 mg via ORAL
  Filled 2023-11-29 (×5): qty 1

## 2023-11-29 MED ORDER — ATORVASTATIN CALCIUM 40 MG PO TABS
40.0000 mg | ORAL_TABLET | Freq: Every day | ORAL | Status: DC
  Administered 2023-11-29 – 2023-12-03 (×5): 40 mg via ORAL
  Filled 2023-11-29 (×5): qty 1

## 2023-11-29 MED ORDER — MOMETASONE FURO-FORMOTEROL FUM 100-5 MCG/ACT IN AERO
2.0000 | INHALATION_SPRAY | Freq: Two times a day (BID) | RESPIRATORY_TRACT | Status: DC
  Filled 2023-11-29: qty 8.8

## 2023-11-29 MED ORDER — SODIUM BICARBONATE 650 MG PO TABS
1300.0000 mg | ORAL_TABLET | Freq: Two times a day (BID) | ORAL | Status: DC
  Administered 2023-11-29 – 2023-12-03 (×8): 1300 mg via ORAL
  Filled 2023-11-29 (×8): qty 2

## 2023-11-29 MED ORDER — FUROSEMIDE 10 MG/ML IJ SOLN
80.0000 mg | Freq: Two times a day (BID) | INTRAMUSCULAR | Status: DC
Start: 2023-11-29 — End: 2023-12-03
  Administered 2023-11-29 – 2023-12-03 (×7): 80 mg via INTRAVENOUS
  Filled 2023-11-29 (×8): qty 8

## 2023-11-29 NOTE — H&P (Addendum)
PCP:   Hoy Register, MD   Chief Complaint:  Shortness of breath  HPI: This is a 63 year old male with past medical history of CKD stage V [not on hemodialysis.  AV graft in place for ~ 20 years per patient], HFpEF, COPD, OSA, gout, HTN, HLD.  He presents to the ER with complaints of increasing shortness of breath and extreme tenderness over the past 3 days.  It has been ongoing but significantly worse recently.  He decided to come to the ER.  He denies the extremity edema.  He denies chest pain.  He states he has a cough but this is mild.  He reports good urine output.  In the ER vital stable.  Creatinine 14.9 [baseline], BNP 2,733 [baseline 1655 done 09/2022], hemoglobin 6.5, platelets 141, MCV 70.8.  Fecal occult blood negative.  CXR cardiomegaly with vascular congestion and diffuse interstitial groundglass opacity  Review of Systems:  Per HPI  Past Medical History: Past Medical History:  Diagnosis Date   Adenomatous colon polyp    tubular   Anemia    Asthma    as a child   CHF (congestive heart failure) (HCC)    Chronic kidney disease    COPD (chronic obstructive pulmonary disease) (HCC)    Diverticulosis    External otitis of right ear 11/10/2018   Gout    Hepatitis C    HLD (hyperlipidemia)    Hypertension    OSA (obstructive sleep apnea) 11/03/2015   Sleep apnea    Strep throat 01/2021   Thyroid disorder    Past Surgical History:  Procedure Laterality Date   A/V FISTULAGRAM N/A 11/08/2023   Procedure: A/V Fistulagram;  Surgeon: Leonie Douglas, MD;  Location: [facility];  Service: Vascular;  Laterality: N/A;   AV FISTULA PLACEMENT  09/12/2009   Left arm AVF    Medications: Prior to Admission medications   Medication Sig Start Date End Date Taking? Authorizing Provider  albuterol (VENTOLIN HFA) 108 (90 Base) MCG/ACT inhaler Inhale 2 puffs into the lungs every 6 (six) hours as needed for wheezing or shortness of breath. 12/01/21  Yes Ghimire, Lyndel Safe, MD   allopurinol (ZYLOPRIM) 100 MG tablet Take 1 tablet (100 mg total) by mouth daily. 02/06/23  Yes Hoy Register, MD  amLODipine (NORVASC) 10 MG tablet Take 1 tablet (10 mg total) by mouth daily. 11/20/23  Yes Georgian Co M, PA-C  atorvastatin (LIPITOR) 40 MG tablet Take 1 tablet (40 mg total) by mouth daily. 11/20/23  Yes McClung, Marzella Schlein, PA-C  colchicine 0.6 MG tablet TAKE 2 TABLETS BY MOUTH AT THE ONSET OF A GOUT FLARE. MAY REPEAT 1 TABLET IN 1 HOUR IF SYMPTOMS PERSIST 10/15/23  Yes Newlin, Enobong, MD  ipratropium-albuterol (DUONEB) 0.5-2.5 (3) MG/3ML SOLN Inhale 3 mLs into the lungs every 6 (six) hours as needed. 11/27/23  Yes Hoy Register, MD  labetalol (NORMODYNE) 300 MG tablet Take 1 tablet by mouth twice daily 11/01/23  Yes Newlin, Enobong, MD  levothyroxine (SYNTHROID) 100 MCG tablet Take 100 mcg by mouth daily. 10/15/23  Yes [provider]  mometasone-formoterol (DULERA) 100-5 MCG/ACT AERO Inhale 2 puffs into the lungs 2 (two) times daily. 11/07/22  Yes Anders Simmonds, PA-C  mupirocin ointment (BACTROBAN) 2 % Apply 1 Application topically 2 (two) times daily. 11/07/22  Yes Anders Simmonds, PA-C  sodium bicarbonate 650 MG tablet Take 1,300 mg by mouth 2 (two) times daily. 09/06/23  Yes [provider]  torsemide (DEMADEX) 100 MG  tablet Take 1 tablet (100 mg total) by mouth daily. 11/07/22 11/29/23 Yes McClung, Marzella Schlein, PA-C  triamcinolone cream (KENALOG) 0.1 % APPLY CREAM EXTERNALLY TWICE DAILY 10/23/23  Yes Hoy Register, MD    Allergies:   Allergies  Allergen Reactions   Heparin     Pt reports he's not sure what type of reaction   Naproxen Other (See Comments)    Unknown reaction    Shellfish Allergy Itching and Swelling    Social History:  reports that he quit smoking about 12 years ago. His smoking use included cigarettes. He has never used smokeless tobacco. He reports that he does not drink alcohol and does not use drugs.  Family History: Family  History  Problem Relation Age of Onset   Hypertension Mother    Diabetes Mother    Heart disease Mother    Alzheimer's disease Mother    Arthritis Mother    Anemia Father    Kidney disease Brother    Colon cancer Neg Hx    Colon polyps Neg Hx    Esophageal cancer Neg Hx    Rectal cancer Neg Hx    Stomach cancer Neg Hx     Physical Exam: Vitals:   11/29/23 0400 11/29/23 0415 11/29/23 0430 11/29/23 0441  BP: (!) 157/95 (!) 170/105 (!) 161/101   Pulse: 80 91 100 89  Resp: 19 (!) 22 (!) 21 (!) 22  Temp:      TempSrc:      SpO2: 100% 99% 97% 99%  Weight:      Height:        General: A and O x 3, well developed and nourished, no acute distress Eyes: PERRLA, pink conjunctiva, no scleral icterus ENT: Moist oral mucosa, neck supple, no thyromegaly Lungs: Mild wheeze, no wheeze, no crackles, no use of accessory muscles Cardiovascular: regular rate and rhythm, no regurgitation, no gallops, no murmurs.  Positive JVD Abdomen: soft, positive BS, NTND, no organomegaly, not an acute abdomen GU: not examined Neuro: CN II - XII grossly intact, Musculoskeletal: Moves all extremities. LLE  2+ pitting edema, RLE 1+.  AV fistula LUE, positive bruit Skin: no rash, no subcutaneous crepitation, no decubitus Psych: appropriate patient   Labs on Admission:  Recent Labs    11/28/23 2351  NA 136  K 5.4*  CL 102  CO2 22  GLUCOSE 100*  BUN 103*  CREATININE 14.90*  CALCIUM 7.9*   Recent Labs    11/28/23 2351  AST 24  ALT 18  ALKPHOS 59  BILITOT 0.9  PROT 7.2  ALBUMIN 3.3*    Recent Labs    11/28/23 2351  WBC 7.1  NEUTROABS 4.6  HGB 6.5*  HCT 19.9*  MCV 70.8*  PLT 141*    Radiological Exams on Admission: DG Chest 2 View Result Date: 11/29/2023 CLINICAL DATA:  Shortness of breath EXAM: CHEST - 2 VIEW COMPARISON:  09/20/2022 FINDINGS: Cardiomegaly with vascular congestion and diffuse interstitial and ground-glass opacity favored to represent pulmonary edema. Small  bilateral effusions. No pneumothorax. Globular cardiac configuration. IMPRESSION: Cardiomegaly with vascular congestion and diffuse interstitial and ground-glass opacity favored to represent pulmonary edema. Small bilateral effusions. Globular cardiac configuration, cannot exclude pericardial effusion Electronically Signed   By: Jasmine Pang M.D.   On: 11/29/2023 00:15    Assessment/Plan Present on Admission:  Acute HFpEF/fluid overload -CHF order set initiated -IV Lasix 80 mg every 12 -Strict I's and O's -Daily weights -Patient currently on room air -Repeat echo ordered -Patient  with little urine output with 80 mg IV Lasix.   Anemia due to chronic kidney disease -Transfuse 1 unit PRBC in ER.  Fecal occult negative -Anemia panel ordered.   -Patient's anemia may have precipitated patient's pulmonary edema.  May benefit from additional transfusion   CKD (chronic kidney disease) stage 5, GFR less than 15 ml/min (HCC) -Bicarb resumed, at baseline -Nephrology consult placed, Dr. Juel Burrow aware -Patient aware that if IV Lasix fails, may need hemodialysis.  Patient has refused hemodialysis previously   COPD (chronic obstructive pulmonary disease) (HCC) -Dulera resumed..  Nebulizers ordered   Essential hypertension -Norvasc, labetalol resumed   Gout -Allopurinol resumed   Hyperlipidemia -Atorvastatin resumed   Hypothyroidism -Levothyroxine resumed   OSA (obstructive sleep apnea) -CPAP ordered   Anxiety  -  Donn Wilmot 11/29/2023, 4:52 AM

## 2023-11-29 NOTE — Progress Notes (Addendum)
Requested to see pt for out-pt HD at d/c. Attempted to call pt's cell phone but there was no answer and unable to leave a message. Text sent to pt requesting a return call. Will assist with out-pt HD arrangements at d/c.   Olivia Canter Renal Navigator [phone]  Addendum at 3:54 pm: Met with pt at bedside. Introduced self and explained role. Pt lives in [place] and toured TCU at [place] in the past. Pt interested in [facility] [facility] at d/c. Contacted TCU staff and was advised program is currently full. Referral submitted to [facility] for in-center HD clinic placement. Pt states he plans to contact transportation through insurance to see if they can transport pt to HD appts at d/c. Will assist as needed.

## 2023-11-29 NOTE — ED Provider Notes (Signed)
[place] EMERGENCY DEPARTMENT AT [facility] Provider Note   CSN: [id number] Arrival date & time: 11/28/23  2329     History  Chief Complaint  Patient presents with   Shortness of Breath    Johnny [name] is a 63 y.o. male.  Patient presents to the emergency department complaining of 3 days of worsening shortness of breath.  He endorses orthopnea and increased lower extremity swelling.  He denies chest pain, abdominal pain, dark or tarry stools, hematemesis.  Past medical history seen for hypertension, COPD, stage V CKD not on dialysis, chronic diastolic CHF   Shortness of Breath      Home Medications Prior to Admission medications   Medication Sig Start Date End Date Taking? Authorizing Provider  albuterol (VENTOLIN HFA) 108 (90 Base) MCG/ACT inhaler Inhale 2 puffs into the lungs every 6 (six) hours as needed for wheezing or shortness of breath. 12/01/21  Yes Ghimire, Lyndel Safe, MD  allopurinol (ZYLOPRIM) 100 MG tablet Take 1 tablet (100 mg total) by mouth daily. 02/06/23  Yes Hoy Register, MD  amLODipine (NORVASC) 10 MG tablet Take 1 tablet (10 mg total) by mouth daily. 11/20/23  Yes Georgian Co M, PA-C  atorvastatin (LIPITOR) 40 MG tablet Take 1 tablet (40 mg total) by mouth daily. 11/20/23  Yes McClung, Marzella Schlein, PA-C  colchicine 0.6 MG tablet TAKE 2 TABLETS BY MOUTH AT THE ONSET OF A GOUT FLARE. MAY REPEAT 1 TABLET IN 1 HOUR IF SYMPTOMS PERSIST 10/15/23  Yes Newlin, Enobong, MD  ipratropium-albuterol (DUONEB) 0.5-2.5 (3) MG/3ML SOLN Inhale 3 mLs into the lungs every 6 (six) hours as needed. 11/27/23  Yes Hoy Register, MD  labetalol (NORMODYNE) 300 MG tablet Take 1 tablet by mouth twice daily 11/01/23  Yes Newlin, Enobong, MD  levothyroxine (SYNTHROID) 100 MCG tablet Take 100 mcg by mouth daily. 10/15/23  Yes [provider]  mometasone-formoterol (DULERA) 100-5 MCG/ACT AERO Inhale 2 puffs into the lungs 2 (two) times daily. 11/07/22  Yes Anders Simmonds,  PA-C  mupirocin ointment (BACTROBAN) 2 % Apply 1 Application topically 2 (two) times daily. 11/07/22  Yes Anders Simmonds, PA-C  sodium bicarbonate 650 MG tablet Take 1,300 mg by mouth 2 (two) times daily. 09/06/23  Yes [provider]  torsemide (DEMADEX) 100 MG tablet Take 1 tablet (100 mg total) by mouth daily. 11/07/22 05/15/23  Anders Simmonds, PA-C  triamcinolone cream (KENALOG) 0.1 % APPLY CREAM EXTERNALLY TWICE DAILY 10/23/23   Hoy Register, MD      Allergies    Heparin, Naproxen, and Shellfish allergy    Review of Systems   Review of Systems  Respiratory:  Positive for shortness of breath.     Physical Exam Updated Vital Signs BP (!) 149/91   Pulse 77   Temp 98.2 F (36.8 C)   Resp 17   Ht 6' (1.829 m)   Wt 81.6 kg   SpO2 99%   BMI 24.41 kg/m  Physical Exam Vitals and nursing note reviewed.  Constitutional:      General: He is not in acute distress.    Appearance: He is well-developed.  HENT:     Head: Normocephalic and atraumatic.  Eyes:     Conjunctiva/sclera: Conjunctivae normal.  Cardiovascular:     Rate and Rhythm: Normal rate and regular rhythm.     Heart sounds: No murmur heard. Pulmonary:     Effort: Pulmonary effort is normal. No respiratory distress.     Breath sounds: Rales present.  Abdominal:     Palpations: Abdomen is soft.     Tenderness: There is no abdominal tenderness.  Genitourinary:    Rectum: Guaiac result negative.  Musculoskeletal:        General: No swelling.     Cervical back: Neck supple.     Right lower leg: Edema present.     Left lower leg: Edema present.  Skin:    General: Skin is warm and dry.     Capillary Refill: Capillary refill takes less than 2 seconds.  Neurological:     Mental Status: He is alert.  Psychiatric:        Mood and Affect: Mood normal.     ED Results / Procedures / Treatments   Labs (all labs ordered are listed, but only abnormal results are displayed) Labs Reviewed  CBC WITH  DIFFERENTIAL/PLATELET - Abnormal; Notable for the following components:      Result Value   RBC 2.81 (*)    Hemoglobin 6.5 (*)    HCT 19.9 (*)    MCV 70.8 (*)    MCH 23.1 (*)    RDW 22.1 (*)    Platelets 141 (*)    All other components within normal limits  COMPREHENSIVE METABOLIC PANEL - Abnormal; Notable for the following components:   Potassium 5.4 (*)    Glucose, Bld 100 (*)    BUN 103 (*)    Creatinine, Ser 14.90 (*)    Calcium 7.9 (*)    Albumin 3.3 (*)    GFR, Estimated 3 (*)    All other components within normal limits  BRAIN NATRIURETIC PEPTIDE - Abnormal; Notable for the following components:   B Natriuretic Peptide 2,733.3 (*)    All other components within normal limits  IRON AND TIBC  VITAMIN B12  FOLATE  RETIC PANEL  CBC WITH DIFFERENTIAL/PLATELET  MAGNESIUM  POC OCCULT BLOOD, ED  TYPE AND SCREEN  PREPARE RBC (CROSSMATCH)  ABO/RH    EKG EKG Interpretation Date/Time:  Thursday November 28 2023 23:39:33 EST Ventricular Rate:  83 PR Interval:  178 QRS Duration:  86 QT Interval:  386 QTC Calculation: 453 R Axis:   62  Text Interpretation: Normal sinus rhythm T wave abnormality, consider lateral ischemia Abnormal ECG When compared with ECG of 20-Sep-2022 13:35, PREVIOUS ECG IS PRESENT Confirmed by Tanda Rockers ([id number]) on 11/29/2023 1:32:57 AM  Radiology DG Chest 2 View Result Date: 11/29/2023 CLINICAL DATA:  Shortness of breath EXAM: CHEST - 2 VIEW COMPARISON:  09/20/2022 FINDINGS: Cardiomegaly with vascular congestion and diffuse interstitial and ground-glass opacity favored to represent pulmonary edema. Small bilateral effusions. No pneumothorax. Globular cardiac configuration. IMPRESSION: Cardiomegaly with vascular congestion and diffuse interstitial and ground-glass opacity favored to represent pulmonary edema. Small bilateral effusions. Globular cardiac configuration, cannot exclude pericardial effusion Electronically Signed   By: Jasmine Pang M.D.   On:  11/29/2023 00:15    Procedures .Critical Care  Performed by: Darrick Grinder, PA-C Authorized by: Darrick Grinder, PA-C   Critical care provider statement:    Critical care time (minutes):  40   Critical care was necessary to treat or prevent imminent or life-threatening deterioration of the following conditions:  Circulatory failure (Symptomatic anemia requiring transfusion)   Critical care was time spent personally by me on the following activities:  Development of treatment plan with patient or surrogate, discussions with consultants, evaluation of patient's response to treatment, examination of patient, ordering and review of laboratory studies, ordering and review of radiographic studies,  ordering and performing treatments and interventions, pulse oximetry, re-evaluation of patient's condition and review of old charts   Care discussed with: admitting provider       Medications Ordered in ED Medications  furosemide (LASIX) injection 80 mg (has no administration in time range)  furosemide (LASIX) injection 80 mg (80 mg Intravenous Given 11/29/23 0158)  0.9 %  sodium chloride infusion (Manually program via Guardrails IV Fluids) ( Intravenous New Bag/Given 11/29/23 0159)    ED Course/ Medical Decision Making/ A&P                                 Medical Decision Making Amount and/or Complexity of Data Reviewed Labs: ordered. Radiology: ordered.  Risk Prescription drug management. Decision regarding hospitalization.   This patient presents to the ED for concern of shortness of breath, this involves an extensive number of treatment options, and is a complaint that carries with it a high risk of complications and morbidity.  The differential diagnosis includes CHF exacerbation, symptomatic anemia, pneumonia, others   Co morbidities that complicate the patient evaluation  Stage V CKD, diastolic CHF   Additional history obtained:  Additional history obtained from  nursing External records from outside source obtained and reviewed including internal medicine notes   Lab Tests:  I Ordered, and personally interpreted labs.  The pertinent results include: BNP 2733.3, hemoglobin 6.5, negative fecal occult test, creatinine 14.9 grossly at baseline   Imaging Studies ordered:  I ordered imaging studies including chest x-ray. I independently visualized and interpreted imaging which showed  Cardiomegaly with vascular congestion and diffuse interstitial and  ground-glass opacity favored to represent pulmonary edema. Small  bilateral effusions.    Globular cardiac configuration, cannot exclude pericardial effusion   I agree with the radiologist interpretation   Cardiac Monitoring: / EKG:  The patient was maintained on a cardiac monitor.  I personally viewed and interpreted the cardiac monitored which showed an underlying rhythm of: sinus rhythm   Consultations Obtained:  I requested consultation with the hospitalist, Dr.Crosley and discussed lab and imaging findings as well as pertinent plan - they recommend: admission   Problem List / ED Course / Critical interventions / Medication management   I ordered medication including Lasix for fluid overload, 1 unit PRBC for symptomatic anemia Reevaluation of the patient after these medicines showed that the patient improved I have reviewed the patients home medicines and have made adjustments as needed    Test / Admission - Considered:  Patient with evidence of acute on chronic CHF exacerbation, also concern for possible symptomatic anemia.  Fecal occult blood test negative.  Patient's baseline hemoglobin is in the mid sevens but tonight he is at 6.5.  Did order 1 unit PRBC along with Lasix for diuresis.  Patient states that he has chosen to not proceed with dialysis.  Feel patient would benefit from admission for continued evaluation and management.         Final Clinical Impression(s) / ED  Diagnoses Final diagnoses:  Acute on chronic diastolic CHF (congestive heart failure) (HCC)  Symptomatic anemia    Rx / DC Orders ED Discharge Orders     None         Pamala Duffel 11/29/23 0302    Sloan Leiter, DO 11/29/23 [phone]

## 2023-11-29 NOTE — Progress Notes (Signed)
Heart Failure Navigator Progress Note  Assessed for Heart & Vascular TOC clinic readiness.  Patient does not meet criteria due to ESRD plan to start hemodialysis.   Navigator will sign off at this time.   Rhae Hammock, BSN, Scientist, clinical (histocompatibility and immunogenetics) Only

## 2023-11-29 NOTE — Progress Notes (Signed)
Received patient in bed to unit.  Alert and oriented.  Informed consent signed and in chart.   TX duration:2.5 hours Patient tolerated well.  Transported back to the room  Alert, without acute distress.  Hand-off given to patient's nurse.   Access used: AVF Access issues: none  Total UF removed:  Medication(s) given: none Post HD VS: see table below Post HD weight: 82.5kg    11/29/23 2001  Vitals  Temp 98.2 F (36.8 C)  Temp Source Oral  BP (!) 144/88  MAP (mmHg) 104  BP Location Right Arm  BP Method Automatic  Patient Position (if appropriate) Lying  Pulse Rate (!) 112  Pulse Rate Source Monitor  ECG Heart Rate (!) 115  Resp (!) 27  Oxygen Therapy  SpO2 100 %  O2 Device Room Air  Patient Activity (if Appropriate) In bed  Pulse Oximetry Type Continuous  During Treatment Monitoring  Blood Flow Rate (mL/min) 0 mL/min  Arterial Pressure (mmHg) -0.8 mmHg  Venous Pressure (mmHg) -1.21 mmHg  TMP (mmHg) 23.23 mmHg  Ultrafiltration Rate (mL/min) 1093 mL/min  Dialysate Flow Rate (mL/min) 300 ml/min  Dialysate Potassium Concentration 2  Dialysate Calcium Concentration 2.5  Duration of HD Treatment -hour(s) 2.5 hour(s)  Cumulative Fluid Removed (mL) per Treatment  2000.19  Post Treatment  Dialyzer Clearance Clear  Hemodialysis Intake (mL) 0 mL  Liters Processed 37.5  Fluid Removed (mL) 2000 mL  Tolerated HD Treatment Yes  Post-Hemodialysis Comments goal met  AVG/AVF Arterial Site Held (minutes) 5 minutes  AVG/AVF Venous Site Held (minutes) 5 minutes  Fistula / Graft Left Upper arm Arteriovenous fistula  Placement Date/Time: 06/07/16 0413   Placed prior to admission: Yes  Orientation: Left  Access Location: Upper arm  Access Type: Arteriovenous fistula  Site Condition No complications  Fistula / Graft Assessment Present;Thrill;Bruit;Aneurysm present  Status Deaccessed;Flushed;Patent      [facility]

## 2023-11-29 NOTE — Plan of Care (Signed)
Care plan reviewed.

## 2023-11-29 NOTE — Progress Notes (Addendum)
Brief same day note:  Patient is a 63 year old male with history of CKD stage IV not on dialysis, AV graft in place, HFpEF, COPD, OSA, gout, hypertension, hyperlipidemia who presented to the ED with complaint of severe shortness of breath, cough.  On presentation creatinine was 14.9, BNP was 2733, hemoglobin was 6.5.  FOBT negative.  Chest x-ray with vascular congestion and diffuse interstitial groundglass opacity.  Suspected from pulm overload.  Nephrology consulted for possible plan for initiating dialysis.  Patient seen and examined at bedside today.  He was comfortable lying on bed.  He was on room air.  He says he feels better after he got blood transfusion.  Denied any shortness of breath during my evaluation.  No cough.  Assessment and plan:  Dyspnea: Currently on room air.  Chest imaging shows features of volume overload, vascular congestion.  Has lower extremity edema .    CKD stage V: Not on dialysis.  Nephrology consulted.  Potassium 5.4, creatinine 14.9, elevated BNP.  Has AV graft on LUE.  He has previously refused hemodialysis.  Now agreed on dialysis .Plan to start on dialysis.  Social worker consulted for arranging outpatient dialysis facility on discharge  Normocytic anemia: No evidence of acute blood loss.  Fecal occult blood negative.  Iron level optimal.  Likely this is associated CKD.  Ordered a unit of PRBC.  HFpEF: Last echo showed EF of 55-60% grade 2 diastolic dysfunction.Appears volume overloaded now.  On torsemide 100 mg daily at home.  New echo pending  COPD: Continue bronchodilators as needed, Dulera resumed  Hypertension: Continue Norvasc, labetalol.  Blood pressure up.  Continue to try to the medications  Gout: Continue allopurinol  Hyperlipidemia: Continue Lipitor  Hypothyroidism: On levothyroxine  OSA: CPAP ordered

## 2023-11-29 NOTE — Consult Note (Signed)
Reason for Consult: Renal failure Referring Physician: Dr. Si Raider  Chief Complaint: This of breath  Assessment/Plan: CKD5 by Dr. Valentino Nose last seen by Dr. Signe Colt.  Patient has been told numerous times he needs to be on dialysis and actually finally went to [facility] unit for a tour recently but declined to start.  Counseled him at length that he really needs to start, creatinine is even higher than baseline and he did not have edema during his last clinic visit.  He has 2+ edema in lower extremities now as well as vascular congestion and certainly needs to be started on dialysis.  Agreeable. On bicarbonate 2 tablets twice daily at home. -Dialysis today and tomorrow low efficiency to decrease the risk of dialysis disequilibrium. -Will initiate clip process  Additional recommendations - Dose all meds for creatinine clearance < 10 ml/min  - Unless absolutely necessary, no MRIs with gadolinium.  - Prefer needle sticks in the dorsum of the hands or wrists.  No blood pressure measurements in right arm. - If blood transfusion is requested during hemodialysis sessions, please alert [place] prior to the session.   Avoid nephrotoxic medications including NSAIDs and iodinated intravenous contrast exposure unless the latter is absolutely indicated.  Preferred narcotic agents for pain control are hydromorphone, fentanyl, and methadone. Morphine should not be used. Avoid Baclofen and avoid oral sodium phosphate and magnesium citrate based laxatives / bowel preps. Continue strict Input and Output monitoring. Will monitor the patient closely with you and intervene or adjust therapy as indicated by changes in clinical status/labs   HFpEF EF 60 to 65% -usually on torsemide 100 mg daily at home Renal osteodystrophy -calcitriol 0.5 mg 2 tablets daily.  Currently on a binder.  Will check a phosphorus level and almost certainly will need to start a binder Anemia - Retacrit 30,000 units every 2 weeks. -Check iron  panel, ESA if needed Hypertension on amlodipine 10 mg, labetalol 300 mg twice daily COPD -breathing treatments per primary team OSA Hypothyroidism -on Synthroid    HPI: Johnny [name] is an 63 y.o. male with a history of substance abuse, hyperlipidemia, hypertension, HCV, HFpEF, COPD, OSA, gout, CKD 5 followed by Dr. Valentino Nose but last seen by Dr. Signe Colt.  Patient had agreed to start dialysis but changed his mind and refused subsequently.  He was just having a very difficult time making up his mind and was last seen on November 14, 2023 at [facility] office.  He was also referred to palliative care at his request.  He is normally on torsemide 100 mg daily and was actually referred to [facility] was on undergoing transplant evaluation.  Patient presenting with increasing shortness of breath and tenderness over extremities for the past few days.  Patient denies any chest pain, tightness, pressure.  He also denies any dysuria, hematuria or obstructive like symptoms.  Emergency room visit creatinine was 14.9 and BNP of 2733 with a hemoglobin of 6.5.  Chest x-ray showed vascular congestion.  ROS Pertinent items are noted in HPI.  Chemistry and CBC: Creat  Date/Time Value Ref Range Status  12/07/2016 10:06 AM 2.78 (H) 0.70 - 1.33 mg/dL Final    Comment:      For patients > or = 61 years of age: The upper reference limit for Creatinine is approximately 13% higher for people identified as African-American.      Creatinine, Ser  Date/Time Value Ref Range Status  11/28/2023 11:51 PM 14.90 (H) 0.61 - 1.24 mg/dL Final  29/52/8413  09:16 AM 13.85 (H) 0.61 - 1.24 mg/dL Final  16/08/9603 54:09 AM 13.88 (H) 0.61 - 1.24 mg/dL Final  81/19/1478 29:56 AM 10.80 (H) 0.61 - 1.24 mg/dL Final  21/30/8657 84:69 AM 11.30 (H) 0.61 - 1.24 mg/dL Final  62/95/2841 32:44 AM 11.12 (H) 0.61 - 1.24 mg/dL Final  11/07/7251 66:44 AM 11.79 (H) 0.61 - 1.24 mg/dL Final  03/47/4259 56:38 PM 11.23 (H) 0.61 - 1.24 mg/dL Final   75/64/3329 51:88 AM 10.25 (H) 0.61 - 1.24 mg/dL Final  41/66/0630 16:01 AM 11.22 (H) 0.61 - 1.24 mg/dL Final  09/32/3557 32:20 AM 10.12 (H) 0.61 - 1.24 mg/dL Final  25/42/7062 37:62 AM 9.50 (H) 0.61 - 1.24 mg/dL Final  83/15/1761 60:73 AM 9.35 (H) 0.61 - 1.24 mg/dL Final  71/04/2693 85:46 AM 9.00 (H) 0.61 - 1.24 mg/dL Final  27/01/5008 38:18 AM 8.69 (H) 0.61 - 1.24 mg/dL Final  29/93/7169 67:89 AM 8.20 (H) 0.61 - 1.24 mg/dL Final  38/08/1750 02:58 PM 8.28 (H) 0.61 - 1.24 mg/dL Final  52/77/8242 35:36 AM 7.34 (H) 0.61 - 1.24 mg/dL Final  14/43/1540 08:67 PM 7.43 (H) 0.61 - 1.24 mg/dL Final  61/95/0932 67:12 PM 7.92 (H) 0.61 - 1.24 mg/dL Final  45/80/9983 38:25 AM 8.29 (H) 0.61 - 1.24 mg/dL Final  05/39/7673 41:93 AM 7.93 (H) 0.61 - 1.24 mg/dL Final  79/12/4095 35:32 AM 6.02 (H) 0.61 - 1.24 mg/dL Final  99/24/2683 41:96 AM 6.03 (H) 0.61 - 1.24 mg/dL Final  22/29/7989 21:19 AM 6.80 (H) 0.61 - 1.24 mg/dL Final  41/74/0814 48:18 PM 6.04 (H) 0.61 - 1.24 mg/dL Final  56/31/4970 26:37 AM 4.74 (H) 0.61 - 1.24 mg/dL Final  85/88/5027 74:12 PM 5.39 (H) 0.76 - 1.27 mg/dL Final  87/86/7672 09:47 PM 3.41 (H) 0.76 - 1.27 mg/dL Final  09/62/8366 29:47 PM 4.26 (H) 0.76 - 1.27 mg/dL Final  65/46/5035 46:56 AM 3.37 (H) 0.76 - 1.27 mg/dL Final  81/27/5170 01:74 AM 2.91 (H) 0.76 - 1.27 mg/dL Final  94/49/6759 16:38 PM 3.04 (H) 0.76 - 1.27 mg/dL Final  46/65/9935 70:17 AM 2.32 (H) 0.61 - 1.24 mg/dL Final  79/39/0300 92:33 AM 2.12 (H) 0.61 - 1.24 mg/dL Final  00/76/2263 33:54 PM 2.22 (H) 0.61 - 1.24 mg/dL Final  56/25/6389 37:34 PM 3.21 (H) 0.61 - 1.24 mg/dL Final  28/76/8115 72:62 PM 2.62 (H) 0.50 - 1.35 mg/dL Final  03/55/9741 63:84 PM 3.4 (H) 0.4 - 1.5 mg/dL Final  53/64/6803 21:22 AM 4.46 (H) 0.4 - 1.5 mg/dL Final  48/25/0037 04:88 AM 4.14 (H) 0.4 - 1.5 mg/dL Final  89/16/9450 38:88 AM 3.89 (H) 0.4 - 1.5 mg/dL Final  28/00/3491 79:15 AM 4.19 (H) 0.4 - 1.5 mg/dL Final  05/69/7948 01:65 PM 4.14 (H)  0.4 - 1.5 mg/dL Final  53/74/8270 78:67 PM 4.23 (H) 0.4 - 1.5 mg/dL Final  54/49/2010 07:12 PM 4.7 (H) 0.4 - 1.5 mg/dL Final   Recent Labs  Lab 11/28/23 2351  NA 136  K 5.4*  CL 102  CO2 22  GLUCOSE 100*  BUN 103*  CREATININE 14.90*  CALCIUM 7.9*   Recent Labs  Lab 11/28/23 2351  WBC 7.1  NEUTROABS 4.6  HGB 6.5*  HCT 19.9*  MCV 70.8*  PLT 141*   Liver Function Tests: Recent Labs  Lab 11/28/23 2351  AST 24  ALT 18  ALKPHOS 59  BILITOT 0.9  PROT 7.2  ALBUMIN 3.3*   No results for input(s): "LIPASE", "AMYLASE" in the last 168 hours. No results for input(s): "AMMONIA"  in the last 168 hours. Cardiac Enzymes: No results for input(s): "CKTOTAL", "CKMB", "CKMBINDEX", "TROPONINI" in the last 168 hours. Iron Studies:  Recent Labs    11/29/23 0138  IRON 58  TIBC 273   PT/INR: @LABRCNTIP (inr:5)  Xrays/Other Studies: ) Results for orders placed or performed during the hospital encounter of 11/28/23 (from the past 48 hours)  CBC with Differential     Status: Abnormal   Collection Time: 11/28/23 11:51 PM  Result Value Ref Range   WBC 7.1 4.0 - 10.5 K/uL   RBC 2.81 (L) 4.22 - 5.81 MIL/uL   Hemoglobin 6.5 (LL) 13.0 - 17.0 g/dL    Comment: REPEATED TO VERIFY Reticulocyte Hemoglobin testing may be clinically indicated, consider ordering this additional test [id number] THIS CRITICAL RESULT HAS VERIFIED AND BEEN CALLED TO W. HICKS RN BY MARY ALAMANO ON 01 24 2025 AT 0021, AND HAS BEEN READ BACK.     HCT 19.9 (L) 39.0 - 52.0 %   MCV 70.8 (L) 80.0 - 100.0 fL   MCH 23.1 (L) 26.0 - 34.0 pg   MCHC 32.7 30.0 - 36.0 g/dL   RDW 54.0 (H) 98.1 - 19.1 %   Platelets 141 (L) 150 - 400 K/uL    Comment: REPEATED TO VERIFY   nRBC 0.0 0.0 - 0.2 %   Neutrophils Relative % 65 %   Neutro Abs 4.6 1.7 - 7.7 K/uL   Lymphocytes Relative 21 %   Lymphs Abs 1.5 0.7 - 4.0 K/uL   Monocytes Relative 9 %   Monocytes Absolute 0.6 0.1 - 1.0 K/uL   Eosinophils Relative 4 %   Eosinophils  Absolute 0.3 0.0 - 0.5 K/uL   Basophils Relative 1 %   Basophils Absolute 0.0 0.0 - 0.1 K/uL   WBC Morphology MORPHOLOGY UNREMARKABLE    RBC Morphology MORPHOLOGY UNREMARKABLE    Smear Review MORPHOLOGY UNREMARKABLE    Immature Granulocytes 0 %   Abs Immature Granulocytes 0.03 0.00 - 0.07 K/uL    Comment: Performed at [facility], [address], [place], [place] [address]  Comprehensive metabolic panel     Status: Abnormal   Collection Time: 11/28/23 11:51 PM  Result Value Ref Range   Sodium 136 135 - 145 mmol/L   Potassium 5.4 (H) 3.5 - 5.1 mmol/L   Chloride 102 98 - 111 mmol/L   CO2 22 22 - 32 mmol/L   Glucose, Bld 100 (H) 70 - 99 mg/dL    Comment: Glucose reference range applies only to samples taken after fasting for at least 8 hours.   BUN 103 (H) 6 - 20 mg/dL   Creatinine, Ser 56.21 (H) 0.61 - 1.24 mg/dL   Calcium 7.9 (L) 8.9 - 10.3 mg/dL   Total Protein 7.2 6.5 - 8.1 g/dL   Albumin 3.3 (L) 3.5 - 5.0 g/dL   AST 24 15 - 41 U/L   ALT 18 0 - 44 U/L   Alkaline Phosphatase 59 38 - 126 U/L   Total Bilirubin 0.9 0.0 - 1.2 mg/dL   GFR, Estimated 3 (L) >60 mL/min    Comment: (NOTE) Calculated using the CKD-EPI Creatinine Equation (2021)    Anion gap 12 5 - 15    Comment: Performed at [facility], [address], [place], [place] [address]  Brain natriuretic peptide     Status: Abnormal   Collection Time: 11/28/23 11:51 PM  Result Value Ref Range   B Natriuretic Peptide 2,733.3 (H) 0.0 - 100.0 pg/mL  Comment: Performed at [facility], [address] [place], [place] [address]  ABO/Rh     Status: None   Collection Time: 11/28/23 11:51 PM  Result Value Ref Range   ABO/RH(D)      O POS Performed at [facility], [address] [place], [place] [address]   Prepare RBC (crossmatch)     Status: None   Collection Time: 11/29/23  1:34 AM  Result Value Ref Range   Order Confirmation      ORDER PROCESSED BY BLOOD BANK Performed at [facility], [address], [place], [place] [address]   Type and screen [facility]     Status: None (Preliminary result)   Collection Time: 11/29/23  1:38 AM  Result Value Ref Range   ABO/RH(D) O POS    Antibody Screen NEG    Sample Expiration 12/02/2023,2359    Unit Number [id number]    Blood Component Type RBC LR PHER2    Unit division 00    Status of Unit ISSUED    Transfusion Status OK TO TRANSFUSE    Crossmatch Result      Compatible Performed at [facility], [address], [place], [place] [address]   Iron and TIBC     Status: None   Collection Time: 11/29/23  1:38 AM  Result Value Ref Range   Iron 58 45 - 182 ug/dL   TIBC 469 629 - 528 ug/dL   Saturation Ratios 21 17.9 - 39.5 %   UIBC 215 ug/dL    Comment: Performed at [facility], [address] [place], [place] [address]  Vitamin B12     Status: None   Collection Time: 11/29/23  1:38 AM  Result Value Ref Range   Vitamin B-12 626 180 - 914 pg/mL    Comment: (NOTE) This assay is not validated for testing neonatal or myeloproliferative syndrome specimens for Vitamin B12 levels. Performed at [facility], [address], [place], [place] [address]   Folate     Status: None   Collection Time: 11/29/23  1:38 AM  Result Value Ref Range   Folate 15.5 >5.9 ng/mL    Comment: Performed at [facility], [address], [place], [place] [address]  Retic Panel     Status: Abnormal   Collection Time: 11/29/23  1:38 AM  Result Value Ref Range   Retic Ct Pct 0.6 0.4 - 3.1 %   RBC. 1.19 (L) 4.22 - 5.81 MIL/uL   Retic Count, Absolute 7.4 (L) 19.0 - 186.0 K/uL   Immature Retic Fract 6.3 2.3 - 15.9 %   Reticulocyte Hemoglobin 27.3 (L) >27.9 pg    Comment:        A RET-He < 28 pg is an indication of iron-deficient or iron- insufficient erythropoiesis. Patients with thalassemia may also have a decreased RET-He result unrelated to iron availability.     If this patient has  chronic kidney disease and does not have a hemoglobinopathy he/she meets criteria for iron deficiency per the 2016 NICE guidelines. Refer to specific guidelines to determine the appropriate thresholds for treating CKD- associated iron deficiency. TSAT and ferritin should be used in patients with hemoglobinopathies (e.g. thalassemia). Performed at [facility], [address], [place], [place] [address]   Magnesium     Status: None   Collection Time: 11/29/23  1:38 AM  Result Value Ref Range   Magnesium  1.9 1.7 - 2.4 mg/dL    Comment: Performed at [facility], [address], [place], [place] [address]  POC occult blood, ED Provider will collect     Status: None   Collection Time: 11/29/23  1:51 AM  Result Value Ref Range   Fecal Occult Blood Negative    DG Chest 2 View Result Date: 11/29/2023 CLINICAL DATA:  Shortness of breath EXAM: CHEST - 2 VIEW COMPARISON:  09/20/2022 FINDINGS: Cardiomegaly with vascular congestion and diffuse interstitial and ground-glass opacity favored to represent pulmonary edema. Small bilateral effusions. No pneumothorax. Globular cardiac configuration. IMPRESSION: Cardiomegaly with vascular congestion and diffuse interstitial and ground-glass opacity favored to represent pulmonary edema. Small bilateral effusions. Globular cardiac configuration, cannot exclude pericardial effusion Electronically Signed   By: Jasmine Pang M.D.   On: 11/29/2023 00:15    PMH:   Past Medical History:  Diagnosis Date   Adenomatous colon polyp    tubular   Anemia    Asthma    as a child   CHF (congestive heart failure) (HCC)    Chronic kidney disease    COPD (chronic obstructive pulmonary disease) (HCC)    Diverticulosis    External otitis of right ear 11/10/2018   Gout    Hepatitis C    HLD (hyperlipidemia)    Hypertension    OSA (obstructive sleep apnea) 11/03/2015   Sleep apnea    Strep throat 01/2021   Thyroid disorder     PSH:   Past  Surgical History:  Procedure Laterality Date   A/V FISTULAGRAM N/A 11/08/2023   Procedure: A/V Fistulagram;  Surgeon: Leonie Douglas, MD;  Location: [facility] INVASIVE CV LAB;  Service: Vascular;  Laterality: N/A;   AV FISTULA PLACEMENT  09/12/2009   Left arm AVF    Allergies:  Allergies  Allergen Reactions   Heparin     Pt reports he's not sure what type of reaction   Naproxen Other (See Comments)    Unknown reaction    Shellfish Allergy Itching and Swelling    Medications:   Prior to Admission medications   Medication Sig Start Date End Date Taking? Authorizing Provider  albuterol (VENTOLIN HFA) 108 (90 Base) MCG/ACT inhaler Inhale 2 puffs into the lungs every 6 (six) hours as needed for wheezing or shortness of breath. 12/01/21  Yes Ghimire, Lyndel Safe, MD  allopurinol (ZYLOPRIM) 100 MG tablet Take 1 tablet (100 mg total) by mouth daily. 02/06/23  Yes Hoy Register, MD  amLODipine (NORVASC) 10 MG tablet Take 1 tablet (10 mg total) by mouth daily. 11/20/23  Yes Georgian Co M, PA-C  atorvastatin (LIPITOR) 40 MG tablet Take 1 tablet (40 mg total) by mouth daily. 11/20/23  Yes McClung, Marzella Schlein, PA-C  colchicine 0.6 MG tablet TAKE 2 TABLETS BY MOUTH AT THE ONSET OF A GOUT FLARE. MAY REPEAT 1 TABLET IN 1 HOUR IF SYMPTOMS PERSIST 10/15/23  Yes Newlin, Enobong, MD  ipratropium-albuterol (DUONEB) 0.5-2.5 (3) MG/3ML SOLN Inhale 3 mLs into the lungs every 6 (six) hours as needed. 11/27/23  Yes Hoy Register, MD  labetalol (NORMODYNE) 300 MG tablet Take 1 tablet by mouth twice daily 11/01/23  Yes Newlin, Enobong, MD  levothyroxine (SYNTHROID) 100 MCG tablet Take 100 mcg by mouth daily. 10/15/23  Yes [provider]  mometasone-formoterol (DULERA) 100-5 MCG/ACT AERO Inhale 2 puffs into the lungs 2 (two) times daily. 11/07/22  Yes Anders Simmonds, PA-C  mupirocin ointment (BACTROBAN) 2 % Apply 1 Application topically 2 (two)  times daily. 11/07/22  Yes Anders Simmonds, PA-C  sodium bicarbonate 650  MG tablet Take 1,300 mg by mouth 2 (two) times daily. 09/06/23  Yes [provider]  torsemide (DEMADEX) 100 MG tablet Take 1 tablet (100 mg total) by mouth daily. 11/07/22 11/29/23 Yes McClung, Marzella Schlein, PA-C  triamcinolone cream (KENALOG) 0.1 % APPLY CREAM EXTERNALLY TWICE DAILY 10/23/23  Yes Hoy Register, MD    Discontinued Meds:   Medications Discontinued During This Encounter  Medication Reason   hydrOXYzine (ATARAX) 25 MG tablet Completed Course   levothyroxine (SYNTHROID) 88 MCG tablet Dose change   Misc. Devices MISC    ondansetron (ZOFRAN) 4 MG tablet Completed Course   terbinafine (LAMISIL AT) 1 % cream Completed Course   mometasone-formoterol (DULERA) 100-5 MCG/ACT inhaler 2 puff     Social History:  reports that he quit smoking about 12 years ago. His smoking use included cigarettes. He has never used smokeless tobacco. He reports that he does not drink alcohol and does not use drugs.  Family History:   Family History  Problem Relation Age of Onset   Hypertension Mother    Diabetes Mother    Heart disease Mother    Alzheimer's disease Mother    Arthritis Mother    Anemia Father    Kidney disease Brother    Colon cancer Neg Hx    Colon polyps Neg Hx    Esophageal cancer Neg Hx    Rectal cancer Neg Hx    Stomach cancer Neg Hx     Blood pressure (!) 161/107, pulse 88, temperature 97.8 F (36.6 C), temperature source Temporal, resp. rate (!) 27, height 6' (1.829 m), weight 81.6 kg, SpO2 97%. General: mild increased respiratory rate, alert and oriented x 3 HEENT: No conjunctival pallor or scleral icterus or oral ulcers Respiratory: Rales at the bases CV: Regular rate and rhythm GI: Soft nondistended nontender positive bowel sounds no rebound or guarding Ext 1+ bilateral lower extremity edema Skn: No rashes Vascular Access: Left AV fistula aneurysmal but positive bruit and thrill      Ragen Laver, Len Blalock, MD 11/29/2023, 7:51 AM

## 2023-11-30 DIAGNOSIS — I5031 Acute diastolic (congestive) heart failure: Secondary | ICD-10-CM | POA: Diagnosis not present

## 2023-11-30 LAB — TYPE AND SCREEN
ABO/RH(D): O POS
Antibody Screen: NEGATIVE
Unit division: 0

## 2023-11-30 LAB — CBC
HCT: 22 % — ABNORMAL LOW (ref 39.0–52.0)
Hemoglobin: 7.3 g/dL — ABNORMAL LOW (ref 13.0–17.0)
MCH: 23.6 pg — ABNORMAL LOW (ref 26.0–34.0)
MCHC: 33.2 g/dL (ref 30.0–36.0)
MCV: 71.2 fL — ABNORMAL LOW (ref 80.0–100.0)
Platelets: 135 10*3/uL — ABNORMAL LOW (ref 150–400)
RBC: 3.09 MIL/uL — ABNORMAL LOW (ref 4.22–5.81)
RDW: 22.3 % — ABNORMAL HIGH (ref 11.5–15.5)
WBC: 7 10*3/uL (ref 4.0–10.5)
nRBC: 0 % (ref 0.0–0.2)

## 2023-11-30 LAB — HEPATITIS B SURFACE ANTIBODY, QUANTITATIVE: Hep B S AB Quant (Post): 14.6 m[IU]/mL

## 2023-11-30 LAB — BPAM RBC
Blood Product Expiration Date: 202502152359
ISSUE DATE / TIME: 202501240334
Unit Type and Rh: 5100

## 2023-11-30 LAB — BASIC METABOLIC PANEL
Anion gap: 12 (ref 5–15)
BUN: 69 mg/dL — ABNORMAL HIGH (ref 6–20)
CO2: 23 mmol/L (ref 22–32)
Calcium: 8.1 mg/dL — ABNORMAL LOW (ref 8.9–10.3)
Chloride: 100 mmol/L (ref 98–111)
Creatinine, Ser: 10.42 mg/dL — ABNORMAL HIGH (ref 0.61–1.24)
GFR, Estimated: 5 mL/min — ABNORMAL LOW (ref >60)
Glucose, Bld: 85 mg/dL (ref 70–99)
Potassium: 4.1 mmol/L (ref 3.5–5.1)
Sodium: 135 mmol/L (ref 135–145)

## 2023-11-30 LAB — PHOSPHORUS: Phosphorus: 4.9 mg/dL — ABNORMAL HIGH (ref 2.5–4.6)

## 2023-11-30 LAB — FERRITIN: Ferritin: 583 ng/mL — ABNORMAL HIGH (ref 24–336)

## 2023-11-30 MED ORDER — COLCHICINE 0.6 MG PO TABS
0.6000 mg | ORAL_TABLET | Freq: Once | ORAL | Status: AC
  Administered 2023-11-30: 0.6 mg via ORAL
  Filled 2023-11-30: qty 1

## 2023-11-30 MED ORDER — COLCHICINE 0.6 MG PO TABS
0.6000 mg | ORAL_TABLET | Freq: Once | ORAL | Status: AC | PRN
  Administered 2023-11-30: 0.6 mg via ORAL
  Filled 2023-11-30: qty 1

## 2023-11-30 MED ORDER — GUAIFENESIN ER 600 MG PO TB12
600.0000 mg | ORAL_TABLET | Freq: Two times a day (BID) | ORAL | Status: DC
  Administered 2023-11-30 – 2023-12-03 (×6): 600 mg via ORAL
  Filled 2023-11-30 (×6): qty 1

## 2023-11-30 MED ORDER — TRIAMCINOLONE ACETONIDE 0.1 % EX CREA
TOPICAL_CREAM | Freq: Two times a day (BID) | CUTANEOUS | Status: DC
  Filled 2023-11-30 (×2): qty 15

## 2023-11-30 MED ORDER — BENZONATATE 100 MG PO CAPS
200.0000 mg | ORAL_CAPSULE | Freq: Three times a day (TID) | ORAL | Status: DC | PRN
  Administered 2023-11-30: 200 mg via ORAL
  Filled 2023-11-30: qty 2

## 2023-11-30 MED ORDER — SODIUM CHLORIDE 0.9 % IV SOLN
100.0000 mg | INTRAVENOUS | Status: DC
  Administered 2023-12-02: 100 mg via INTRAVENOUS
  Filled 2023-11-30: qty 5

## 2023-11-30 NOTE — Plan of Care (Signed)

## 2023-11-30 NOTE — Progress Notes (Signed)
[facility] night cross cover note:  Per patient request, I have resumed home colchicine for patient's concern regarding acute gout flare.     Newton Pigg, DO Hospitalist

## 2023-11-30 NOTE — Progress Notes (Signed)
   11/30/23 1822  Vitals  Temp 98.6 F (37 C)  Pulse Rate 74  Resp 20  BP (!) 140/72  SpO2 100 %  O2 Device Room Air  Weight 79.5 kg  Type of Weight Post-Dialysis  Oxygen Therapy  Patient Activity (if Appropriate) In bed  Pulse Oximetry Type Continuous  Oximetry Probe Site Changed No  Post Treatment  Dialyzer Clearance Lightly streaked  Hemodialysis Intake (mL) 0 mL  Liters Processed 36  Fluid Removed (mL) 2400 mL  Tolerated HD Treatment Yes  AVG/AVF Arterial Site Held (minutes) 7 minutes  AVG/AVF Venous Site Held (minutes) 7 minutes   Received patient in bed to unit.  Alert and oriented.  Informed consent signed and in chart.   TX duration:3.0 HOURS  Patient tolerated well.  Transported back to the room  Alert, without acute distress.  Hand-off given to patient's nurse.   Access used: L AVF   Total UF removed: Medication(s) given: See MAR    Laqueta Due, RN [facility] Dialysis Unit

## 2023-11-30 NOTE — Progress Notes (Signed)
Johnny [name] is an 63 y.o. male with a history of substance abuse, HLD, HTN, HCV, HFpEF, COPD, OSA, gout, CKD 5 followed by Dr. Valentino Nose but last seen by Dr. Signe Colt.  Patient had agreed to start dialysis but changed his mind and refused subsequently.  Normally on torsemide 100 mg daily and was actually referred to [facility] was on undergoing transplant evaluation.  Here with SOB. In the ED Cr 14.9 and BNP of 2733 with a hemoglobin of 6.5.  Chest x-ray showed vascular congestion.   Assessment/Plan: CKD5 by Dr. Valentino Nose last seen by Dr. Signe Colt.  Patient has been told numerous times he needs to be on dialysis and actually finally went to [facility] unit for a tour recently but declined to start.  Counseled him at length that he really needs to start, creatinine is even higher than baseline and he did not have edema during his last clinic visit.  He has 2+ edema in lower extremities now as well as vascular congestion and certainly needs to be started on dialysis.  Agreeable. On bicarbonate 2 tablets twice daily at home. -Tolerated 1st dialysis yesterday and on for a 3-hour low efficiency treatment today to decrease the risk of dialysis disequilibrium.  Plan MWF regimen -clip process initiated   Additional recommendations - Dose all meds for creatinine clearance < 10 ml/min  - Unless absolutely necessary, no MRIs with gadolinium.  - Prefer needle sticks in the dorsum of the hands or wrists.  No blood pressure measurements in right arm. - If blood transfusion is requested during hemodialysis sessions, please alert [place] prior to the session.    Avoid nephrotoxic medications including NSAIDs and iodinated intravenous contrast exposure unless the latter is absolutely indicated.  Preferred narcotic agents for pain control are hydromorphone, fentanyl, and methadone. Morphine should not be used. Avoid Baclofen and avoid oral sodium phosphate and magnesium citrate based laxatives / bowel preps. Continue strict Input and  Output monitoring. Will monitor the patient closely with you and intervene or adjust therapy as indicated by changes in clinical status/labs    HFpEF EF 60 to 65% -usually on torsemide 100 mg daily at home Renal osteodystrophy -calcitriol 0.5 mg 2 tablets daily.  Currently not on a binder.  Will check a phosphorus level and almost certainly will need to start a binder Anemia - Retacrit 30,000 units every 2 weeks. -TSAT 21%, no ferritin -> will load with 500mg  of iv iron 1st Hypertension on amlodipine 10 mg, labetalol 300 mg twice daily COPD -breathing treatments per primary team OSA Hypothyroidism -on Synthroid  Subjective:  Breathing is better, elevated dialysis yesterday.  Denies fever chills nausea vomiting chest pain.   Chemistry and CBC: Creat  Date/Time Value Ref Range Status  12/07/2016 10:06 AM 2.78 (H) 0.70 - 1.33 mg/dL Final    Comment:      For patients > or = 61 years of age: The upper reference limit for Creatinine is approximately 13% higher for people identified as African-American.      Creatinine, Ser  Date/Time Value Ref Range Status  11/30/2023 02:45 AM 10.42 (H) 0.61 - 1.24 mg/dL Final  16/08/9603 54:09 PM 14.90 (H) 0.61 - 1.24 mg/dL Final  81/19/1478 29:56 AM 13.85 (H) 0.61 - 1.24 mg/dL Final  21/30/8657 84:69 AM 13.88 (H) 0.61 - 1.24 mg/dL Final  62/95/2841 32:44 AM 10.80 (H) 0.61 - 1.24 mg/dL Final  11/07/7251 66:44 AM 11.30 (H) 0.61 - 1.24 mg/dL Final  03/47/4259 56:38 AM 11.12 (H) 0.61 -  1.24 mg/dL Final  16/08/9603 54:09 AM 11.79 (H) 0.61 - 1.24 mg/dL Final  81/19/1478 29:56 PM 11.23 (H) 0.61 - 1.24 mg/dL Final  21/30/8657 84:69 AM 10.25 (H) 0.61 - 1.24 mg/dL Final  62/95/2841 32:44 AM 11.22 (H) 0.61 - 1.24 mg/dL Final  11/07/7251 66:44 AM 10.12 (H) 0.61 - 1.24 mg/dL Final  03/47/4259 56:38 AM 9.50 (H) 0.61 - 1.24 mg/dL Final  75/64/3329 51:88 AM 9.35 (H) 0.61 - 1.24 mg/dL Final  41/66/0630 16:01 AM 9.00 (H) 0.61 - 1.24 mg/dL Final  09/32/3557  32:20 AM 8.69 (H) 0.61 - 1.24 mg/dL Final  25/42/7062 37:62 AM 8.20 (H) 0.61 - 1.24 mg/dL Final  83/15/1761 60:73 PM 8.28 (H) 0.61 - 1.24 mg/dL Final  71/04/2693 85:46 AM 7.34 (H) 0.61 - 1.24 mg/dL Final  27/01/5008 38:18 PM 7.43 (H) 0.61 - 1.24 mg/dL Final  29/93/7169 67:89 PM 7.92 (H) 0.61 - 1.24 mg/dL Final  38/08/1750 02:58 AM 8.29 (H) 0.61 - 1.24 mg/dL Final  52/77/8242 35:36 AM 7.93 (H) 0.61 - 1.24 mg/dL Final  14/43/1540 08:67 AM 6.02 (H) 0.61 - 1.24 mg/dL Final  61/95/0932 67:12 AM 6.03 (H) 0.61 - 1.24 mg/dL Final  45/80/9983 38:25 AM 6.80 (H) 0.61 - 1.24 mg/dL Final  05/39/7673 41:93 PM 6.04 (H) 0.61 - 1.24 mg/dL Final  79/12/4095 35:32 AM 4.74 (H) 0.61 - 1.24 mg/dL Final  99/24/2683 41:96 PM 5.39 (H) 0.76 - 1.27 mg/dL Final  22/29/7989 21:19 PM 3.41 (H) 0.76 - 1.27 mg/dL Final  41/74/0814 48:18 PM 4.26 (H) 0.76 - 1.27 mg/dL Final  56/31/4970 26:37 AM 3.37 (H) 0.76 - 1.27 mg/dL Final  85/88/5027 74:12 AM 2.91 (H) 0.76 - 1.27 mg/dL Final  87/86/7672 09:47 PM 3.04 (H) 0.76 - 1.27 mg/dL Final  09/62/8366 29:47 AM 2.32 (H) 0.61 - 1.24 mg/dL Final  65/46/5035 46:56 AM 2.12 (H) 0.61 - 1.24 mg/dL Final  81/27/5170 01:74 PM 2.22 (H) 0.61 - 1.24 mg/dL Final  94/49/6759 16:38 PM 3.21 (H) 0.61 - 1.24 mg/dL Final  46/65/9935 70:17 PM 2.62 (H) 0.50 - 1.35 mg/dL Final  79/39/0300 92:33 PM 3.4 (H) 0.4 - 1.5 mg/dL Final  00/76/2263 33:54 AM 4.46 (H) 0.4 - 1.5 mg/dL Final  56/25/6389 37:34 AM 4.14 (H) 0.4 - 1.5 mg/dL Final  28/76/8115 72:62 AM 3.89 (H) 0.4 - 1.5 mg/dL Final  03/55/9741 63:84 AM 4.19 (H) 0.4 - 1.5 mg/dL Final  53/64/6803 21:22 PM 4.14 (H) 0.4 - 1.5 mg/dL Final  48/25/0037 04:88 PM 4.23 (H) 0.4 - 1.5 mg/dL Final  89/16/9450 38:88 PM 4.7 (H) 0.4 - 1.5 mg/dL Final   Recent Labs  Lab 11/28/23 2351 11/30/23 0245  NA 136 135  K 5.4* 4.1  CL 102 100  CO2 22 23  GLUCOSE 100* 85  BUN 103* 69*  CREATININE 14.90* 10.42*  CALCIUM 7.9* 8.1*   Recent Labs  Lab 11/28/23 2351  11/29/23 0843 11/30/23 0245  WBC 7.1 8.8 7.0  NEUTROABS 4.6 7.6  --   HGB 6.5* 7.3* 7.3*  HCT 19.9* 21.8* 22.0*  MCV 70.8* 72.7* 71.2*  PLT 141* 135* 135*   Liver Function Tests: Recent Labs  Lab 11/28/23 2351  AST 24  ALT 18  ALKPHOS 59  BILITOT 0.9  PROT 7.2  ALBUMIN 3.3*   No results for input(s): "LIPASE", "AMYLASE" in the last 168 hours. No results for input(s): "AMMONIA" in the last 168 hours. Cardiac Enzymes: No results for input(s): "CKTOTAL", "CKMB", "CKMBINDEX", "TROPONINI" in the last 168 hours. Iron Studies:  Recent Labs    11/29/23 0138  IRON 58  TIBC 273   PT/INR: @LABRCNTIP (inr:5)  Xrays/Other Studies: ) Results for orders placed or performed during the hospital encounter of 11/28/23 (from the past 48 hours)  CBC with Differential     Status: Abnormal   Collection Time: 11/28/23 11:51 PM  Result Value Ref Range   WBC 7.1 4.0 - 10.5 K/uL   RBC 2.81 (L) 4.22 - 5.81 MIL/uL   Hemoglobin 6.5 (LL) 13.0 - 17.0 g/dL    Comment: REPEATED TO VERIFY Reticulocyte Hemoglobin testing may be clinically indicated, consider ordering this additional test [id number] THIS CRITICAL RESULT HAS VERIFIED AND BEEN CALLED TO W. HICKS RN BY MARY ALAMANO ON 01 24 2025 AT 0021, AND HAS BEEN READ BACK.     HCT 19.9 (L) 39.0 - 52.0 %   MCV 70.8 (L) 80.0 - 100.0 fL   MCH 23.1 (L) 26.0 - 34.0 pg   MCHC 32.7 30.0 - 36.0 g/dL   RDW 54.0 (H) 98.1 - 19.1 %   Platelets 141 (L) 150 - 400 K/uL    Comment: REPEATED TO VERIFY   nRBC 0.0 0.0 - 0.2 %   Neutrophils Relative % 65 %   Neutro Abs 4.6 1.7 - 7.7 K/uL   Lymphocytes Relative 21 %   Lymphs Abs 1.5 0.7 - 4.0 K/uL   Monocytes Relative 9 %   Monocytes Absolute 0.6 0.1 - 1.0 K/uL   Eosinophils Relative 4 %   Eosinophils Absolute 0.3 0.0 - 0.5 K/uL   Basophils Relative 1 %   Basophils Absolute 0.0 0.0 - 0.1 K/uL   WBC Morphology MORPHOLOGY UNREMARKABLE    RBC Morphology MORPHOLOGY UNREMARKABLE    Smear Review MORPHOLOGY  UNREMARKABLE    Immature Granulocytes 0 %   Abs Immature Granulocytes 0.03 0.00 - 0.07 K/uL    Comment: Performed at [facility], [address], [place], [place] [address]  Comprehensive metabolic panel     Status: Abnormal   Collection Time: 11/28/23 11:51 PM  Result Value Ref Range   Sodium 136 135 - 145 mmol/L   Potassium 5.4 (H) 3.5 - 5.1 mmol/L   Chloride 102 98 - 111 mmol/L   CO2 22 22 - 32 mmol/L   Glucose, Bld 100 (H) 70 - 99 mg/dL    Comment: Glucose reference range applies only to samples taken after fasting for at least 8 hours.   BUN 103 (H) 6 - 20 mg/dL   Creatinine, Ser 56.21 (H) 0.61 - 1.24 mg/dL   Calcium 7.9 (L) 8.9 - 10.3 mg/dL   Total Protein 7.2 6.5 - 8.1 g/dL   Albumin 3.3 (L) 3.5 - 5.0 g/dL   AST 24 15 - 41 U/L   ALT 18 0 - 44 U/L   Alkaline Phosphatase 59 38 - 126 U/L   Total Bilirubin 0.9 0.0 - 1.2 mg/dL   GFR, Estimated 3 (L) >60 mL/min    Comment: (NOTE) Calculated using the CKD-EPI Creatinine Equation (2021)    Anion gap 12 5 - 15    Comment: Performed at [facility], [address], [place], [place] [address]  Brain natriuretic peptide     Status: Abnormal   Collection Time: 11/28/23 11:51 PM  Result Value Ref Range   B Natriuretic Peptide 2,733.3 (H) 0.0 - 100.0 pg/mL    Comment: Performed at [facility], [address], [place], [place] [address]  ABO/Rh     Status:  None   Collection Time: 11/28/23 11:51 PM  Result Value Ref Range   ABO/RH(D)      O POS Performed at [facility], [address] [place], [place] [address]   Prepare RBC (crossmatch)     Status: None   Collection Time: 11/29/23  1:34 AM  Result Value Ref Range   Order Confirmation      ORDER PROCESSED BY BLOOD BANK Performed at [facility], [address] [place], [place] [address]   Type and screen [facility]     Status: None   Collection Time: 11/29/23  1:38 AM  Result Value Ref Range   ABO/RH(D) O POS    Antibody  Screen NEG    Sample Expiration 12/02/2023,2359    Unit Number [id number]    Blood Component Type RBC LR PHER2    Unit division 00    Status of Unit ISSUED,FINAL    Transfusion Status OK TO TRANSFUSE    Crossmatch Result      Compatible Performed at [facility], [address], [place], [place] [address]   Iron and TIBC     Status: None   Collection Time: 11/29/23  1:38 AM  Result Value Ref Range   Iron 58 45 - 182 ug/dL   TIBC 865 784 - 696 ug/dL   Saturation Ratios 21 17.9 - 39.5 %   UIBC 215 ug/dL    Comment: Performed at [facility], [address] [place], [place] [address]  Vitamin B12     Status: None   Collection Time: 11/29/23  1:38 AM  Result Value Ref Range   Vitamin B-12 626 180 - 914 pg/mL    Comment: (NOTE) This assay is not validated for testing neonatal or myeloproliferative syndrome specimens for Vitamin B12 levels. Performed at [facility], [address] [place], [place] [address]   Folate     Status: None   Collection Time: 11/29/23  1:38 AM  Result Value Ref Range   Folate 15.5 >5.9 ng/mL    Comment: Performed at [facility], [address] [place], [place] [address]  Retic Panel     Status: Abnormal   Collection Time: 11/29/23  1:38 AM  Result Value Ref Range   Retic Ct Pct 0.6 0.4 - 3.1 %   RBC. 1.19 (L) 4.22 - 5.81 MIL/uL   Retic Count, Absolute 7.4 (L) 19.0 - 186.0 K/uL   Immature Retic Fract 6.3 2.3 - 15.9 %   Reticulocyte Hemoglobin 27.3 (L) >27.9 pg    Comment:        A RET-He < 28 pg is an indication of iron-deficient or iron- insufficient erythropoiesis. Patients with thalassemia may also have a decreased RET-He result unrelated to iron availability.     If this patient has chronic kidney disease and does not have a hemoglobinopathy he/she meets criteria for iron deficiency per the 2016 NICE guidelines. Refer to specific guidelines to determine the appropriate thresholds for treating  CKD- associated iron deficiency. TSAT and ferritin should be used in patients with hemoglobinopathies (e.g. thalassemia). Performed at [facility], [address], [place], [place] [address]   Magnesium     Status: None   Collection Time: 11/29/23  1:38 AM  Result Value Ref Range   Magnesium 1.9 1.7 - 2.4 mg/dL    Comment: Performed at [facility], [address], [place], [place] [address]  HIV Antibody (routine testing w rflx)     Status: None   Collection Time: 11/29/23  1:38 AM  Result Value Ref Range   HIV Screen 4th Generation wRfx Non Reactive Non Reactive    Comment: Performed at [facility], [address], [place], [place] [address]  POC occult blood, ED Provider will collect     Status: None   Collection Time: 11/29/23  1:51 AM  Result Value Ref Range   Fecal Occult Blood Negative   CBC with Differential/Platelet     Status: Abnormal   Collection Time: 11/29/23  8:43 AM  Result Value Ref Range   WBC 8.8 4.0 - 10.5 K/uL   RBC 3.00 (L) 4.22 - 5.81 MIL/uL   Hemoglobin 7.3 (L) 13.0 - 17.0 g/dL    Comment: Reticulocyte Hemoglobin testing may be clinically indicated, consider ordering this additional test [id number]    HCT 21.8 (L) 39.0 - 52.0 %   MCV 72.7 (L) 80.0 - 100.0 fL   MCH 24.3 (L) 26.0 - 34.0 pg   MCHC 33.5 30.0 - 36.0 g/dL   RDW 72.5 (H) 36.6 - 44.0 %   Platelets 135 (L) 150 - 400 K/uL    Comment: REPEATED TO VERIFY   nRBC 0.0 0.0 - 0.2 %   Neutrophils Relative % 86 %   Neutro Abs 7.6 1.7 - 7.7 K/uL   Lymphocytes Relative 8 %   Lymphs Abs 0.7 0.7 - 4.0 K/uL   Monocytes Relative 2 %   Monocytes Absolute 0.2 0.1 - 1.0 K/uL   Eosinophils Relative 3 %   Eosinophils Absolute 0.3 0.0 - 0.5 K/uL   Basophils Relative 1 %   Basophils Absolute 0.1 0.0 - 0.1 K/uL   WBC Morphology See Note     Comment: Morphology unremarkable   Smear Review See Note     Comment: Normal Platelet Morphology   nRBC 0 0 /100 WBC   Abs Immature Granulocytes  0.00 0.00 - 0.07 K/uL   Schistocytes PRESENT     Comment: Performed at [facility], [address], [place], [place] [address]  Hepatitis B surface antibody,quantitative     Status: None   Collection Time: 11/29/23 11:15 AM  Result Value Ref Range   Hep B S AB Quant (Post) 14.6 Immunity>10 mIU/mL    Comment: (NOTE)  Status of Immunity                     Anti-HBs Level  ------------------                     -------------- Inconsistent with Immunity                  0.0 - 10.0 Consistent with Immunity                         >10.0 Performed At: [facility] [address] [place], [place] [id number] Jolene Schimke MD EP:[id number]   Hepatitis B surface antigen     Status: None   Collection Time: 11/29/23 11:15 AM  Result Value Ref Range   Hepatitis B Surface Ag NON REACTIVE NON REACTIVE    Comment: Performed at [facility], [address], [place], [place] [address]  Basic metabolic panel     Status: Abnormal   Collection Time: 11/30/23  2:45 AM  Result Value Ref Range   Sodium 135 135 - 145  mmol/L   Potassium 4.1 3.5 - 5.1 mmol/L   Chloride 100 98 - 111 mmol/L   CO2 23 22 - 32 mmol/L   Glucose, Bld 85 70 - 99 mg/dL    Comment: Glucose reference range applies only to samples taken after fasting for at least 8 hours.   BUN 69 (H) 6 - 20 mg/dL   Creatinine, Ser 40.98 (H) 0.61 - 1.24 mg/dL   Calcium 8.1 (L) 8.9 - 10.3 mg/dL   GFR, Estimated 5 (L) >60 mL/min    Comment: (NOTE) Calculated using the CKD-EPI Creatinine Equation (2021)    Anion gap 12 5 - 15    Comment: Performed at [facility], [address] [place], [place] [address]  CBC     Status: Abnormal   Collection Time: 11/30/23  2:45 AM  Result Value Ref Range   WBC 7.0 4.0 - 10.5 K/uL   RBC 3.09 (L) 4.22 - 5.81 MIL/uL   Hemoglobin 7.3 (L) 13.0 - 17.0 g/dL    Comment: Reticulocyte Hemoglobin testing may be clinically indicated, consider ordering this additional test [id number]    HCT  22.0 (L) 39.0 - 52.0 %   MCV 71.2 (L) 80.0 - 100.0 fL   MCH 23.6 (L) 26.0 - 34.0 pg   MCHC 33.2 30.0 - 36.0 g/dL   RDW 30.8 (H) 65.7 - 84.6 %   Platelets 135 (L) 150 - 400 K/uL    Comment: REPEATED TO VERIFY   nRBC 0.0 0.0 - 0.2 %    Comment: Performed at [facility], [address] [place], [place] [address]   ECHOCARDIOGRAM COMPLETE Result Date: 11/29/2023    ECHOCARDIOGRAM REPORT   Patient Name:   Johnny [name] Date of Exam: 11/29/2023 Medical Rec #:  [id number]     Height:       72.0 in Accession #:    [id number]    Weight:       180.0 lb Date of Birth:  01-24-1961     BSA:          2.037 m Patient Age:    62 years      BP:           141/97 mmHg Patient Gender: M             HR:           80 bpm. Exam Location:  Inpatient Procedure: 2D Echo, Cardiac Doppler and Color Doppler Indications:    CHF-Acute Diastolic I50.31  History:        Patient has prior history of Echocardiogram examinations, most                 recent 09/21/2022. CHF, COPD; Risk Factors:Hypertension,                 Diabetes and Sleep Apnea.  Sonographer:    Darlys Gales Referring Phys: [id number] DEBBY CROSLEY IMPRESSIONS  1. Left ventricular ejection fraction, by estimation, is 60 to 65%. The left ventricle has normal function. The left ventricle has no regional wall motion abnormalities. The left ventricular internal cavity size was mildly dilated. Left ventricular diastolic parameters are consistent with Grade II diastolic dysfunction (pseudonormalization).  2. Right ventricular systolic function is normal. The right ventricular size is normal. There is normal pulmonary artery systolic pressure.  3. Left atrial size was severely dilated.  4. Right atrial size was moderately dilated.  5. The mitral valve is normal in structure. Mild mitral  valve regurgitation. No evidence of mitral stenosis.  6. The aortic valve is tricuspid. Aortic valve regurgitation is not visualized. No aortic stenosis is present.  7. The inferior vena cava is  normal in size with greater than 50% respiratory variability, suggesting right atrial pressure of 3 mmHg. FINDINGS  Left Ventricle: Left ventricular ejection fraction, by estimation, is 60 to 65%. The left ventricle has normal function. The left ventricle has no regional wall motion abnormalities. The left ventricular internal cavity size was mildly dilated. There is  no left ventricular hypertrophy. Left ventricular diastolic parameters are consistent with Grade II diastolic dysfunction (pseudonormalization). Right Ventricle: The right ventricular size is normal. No increase in right ventricular wall thickness. Right ventricular systolic function is normal. There is normal pulmonary artery systolic pressure. The tricuspid regurgitant velocity is 1.02 m/s, and  with an assumed right atrial pressure of 15 mmHg, the estimated right ventricular systolic pressure is 19.2 mmHg. Left Atrium: Left atrial size was severely dilated. Right Atrium: Right atrial size was moderately dilated. Pericardium: There is no evidence of pericardial effusion. Mitral Valve: The mitral valve is normal in structure. Mild mitral valve regurgitation. No evidence of mitral valve stenosis. Tricuspid Valve: The tricuspid valve is normal in structure. Tricuspid valve regurgitation is mild . No evidence of tricuspid stenosis. Aortic Valve: The aortic valve is tricuspid. Aortic valve regurgitation is not visualized. No aortic stenosis is present. Aortic valve peak gradient measures 9.0 mmHg. Pulmonic Valve: The pulmonic valve was normal in structure. Pulmonic valve regurgitation is not visualized. No evidence of pulmonic stenosis. Aorta: The aortic root is normal in size and structure. Venous: The inferior vena cava is normal in size with greater than 50% respiratory variability, suggesting right atrial pressure of 3 mmHg. IAS/Shunts: No atrial level shunt detected by color flow Doppler.  LEFT VENTRICLE PLAX 2D LVIDd:         6.10 cm   Diastology LV  PW:         2.20 cm   LV e' medial:    7.51 cm/s LV IVS:        0.60 cm   LV E/e' medial:  13.4 LVOT diam:     2.00 cm   LV e' lateral:   8.49 cm/s LV SV:         98        LV E/e' lateral: 11.9 LV SV Index:   48 LVOT Area:     3.14 cm  RIGHT VENTRICLE             IVC RV S prime:     11.60 cm/s  IVC diam: 2.10 cm TAPSE (M-mode): 2.5 cm LEFT ATRIUM             Index        RIGHT ATRIUM           Index LA Vol (A2C):   99.7 ml 48.94 ml/m  RA Area:     23.10 cm LA Vol (A4C):   97.1 ml 47.66 ml/m  RA Volume:   70.50 ml  34.61 ml/m LA Biplane Vol: 99.0 ml 48.59 ml/m  AORTIC VALVE AV Area (Vmax): 3.14 cm AV Vmax:        150.00 cm/s AV Peak Grad:   9.0 mmHg LVOT Vmax:      150.00 cm/s LVOT Vmean:     107.000 cm/s LVOT VTI:       0.311 m MITRAL VALVE  TRICUSPID VALVE MV Area (PHT): 3.89 cm     TR Peak grad:   4.2 mmHg MV Decel Time: 195 msec     TR Vmax:        102.00 cm/s MV E velocity: 101.00 cm/s MV A velocity: 61.80 cm/s   SHUNTS MV E/A ratio:  1.63         Systemic VTI:  0.31 m                             Systemic Diam: 2.00 cm Chilton Si MD Electronically signed by Chilton Si MD Signature Date/Time: 11/29/2023/4:33:39 PM    Final    DG Chest 2 View Result Date: 11/29/2023 CLINICAL DATA:  Shortness of breath EXAM: CHEST - 2 VIEW COMPARISON:  09/20/2022 FINDINGS: Cardiomegaly with vascular congestion and diffuse interstitial and ground-glass opacity favored to represent pulmonary edema. Small bilateral effusions. No pneumothorax. Globular cardiac configuration. IMPRESSION: Cardiomegaly with vascular congestion and diffuse interstitial and ground-glass opacity favored to represent pulmonary edema. Small bilateral effusions. Globular cardiac configuration, cannot exclude pericardial effusion Electronically Signed   By: Jasmine Pang M.D.   On: 11/29/2023 00:15    PMH:   Past Medical History:  Diagnosis Date   Adenomatous colon polyp    tubular   Anemia    Asthma    as a child    CHF (congestive heart failure) (HCC)    Chronic kidney disease    COPD (chronic obstructive pulmonary disease) (HCC)    Diverticulosis    External otitis of right ear 11/10/2018   Gout    Hepatitis C    HLD (hyperlipidemia)    Hypertension    OSA (obstructive sleep apnea) 11/03/2015   Sleep apnea    Strep throat 01/2021   Thyroid disorder     PSH:   Past Surgical History:  Procedure Laterality Date   A/V FISTULAGRAM N/A 11/08/2023   Procedure: A/V Fistulagram;  Surgeon: Leonie Douglas, MD;  Location: [facility] INVASIVE CV LAB;  Service: Vascular;  Laterality: N/A;   AV FISTULA PLACEMENT  09/12/2009   Left arm AVF    Allergies:  Allergies  Allergen Reactions   Heparin     Pt reports he's not sure what type of reaction   Naproxen Other (See Comments)    Unknown reaction    Shellfish Allergy Itching and Swelling    Medications:   Prior to Admission medications   Medication Sig Start Date End Date Taking? Authorizing Provider  albuterol (VENTOLIN HFA) 108 (90 Base) MCG/ACT inhaler Inhale 2 puffs into the lungs every 6 (six) hours as needed for wheezing or shortness of breath. 12/01/21  Yes Ghimire, Lyndel Safe, MD  allopurinol (ZYLOPRIM) 100 MG tablet Take 1 tablet (100 mg total) by mouth daily. 02/06/23  Yes Hoy Register, MD  amLODipine (NORVASC) 10 MG tablet Take 1 tablet (10 mg total) by mouth daily. 11/20/23  Yes Georgian Co M, PA-C  atorvastatin (LIPITOR) 40 MG tablet Take 1 tablet (40 mg total) by mouth daily. 11/20/23  Yes McClung, Marzella Schlein, PA-C  colchicine 0.6 MG tablet TAKE 2 TABLETS BY MOUTH AT THE ONSET OF A GOUT FLARE. MAY REPEAT 1 TABLET IN 1 HOUR IF SYMPTOMS PERSIST 10/15/23  Yes Newlin, Enobong, MD  ipratropium-albuterol (DUONEB) 0.5-2.5 (3) MG/3ML SOLN Inhale 3 mLs into the lungs every 6 (six) hours as needed. 11/27/23  Yes Hoy Register, MD  labetalol (NORMODYNE) 300 MG tablet Take 1 tablet  by mouth twice daily 11/01/23  Yes Newlin, Odette Horns, MD  levothyroxine  (SYNTHROID) 100 MCG tablet Take 100 mcg by mouth daily. 10/15/23  Yes [provider]  mometasone-formoterol (DULERA) 100-5 MCG/ACT AERO Inhale 2 puffs into the lungs 2 (two) times daily. 11/07/22  Yes Anders Simmonds, PA-C  mupirocin ointment (BACTROBAN) 2 % Apply 1 Application topically 2 (two) times daily. 11/07/22  Yes Anders Simmonds, PA-C  sodium bicarbonate 650 MG tablet Take 1,300 mg by mouth 2 (two) times daily. 09/06/23  Yes [provider]  torsemide (DEMADEX) 100 MG tablet Take 1 tablet (100 mg total) by mouth daily. 11/07/22 11/29/23 Yes McClung, Marzella Schlein, PA-C  triamcinolone cream (KENALOG) 0.1 % APPLY CREAM EXTERNALLY TWICE DAILY 10/23/23  Yes Hoy Register, MD    Discontinued Meds:   Medications Discontinued During This Encounter  Medication Reason   hydrOXYzine (ATARAX) 25 MG tablet Completed Course   levothyroxine (SYNTHROID) 88 MCG tablet Dose change   Misc. Devices MISC    ondansetron (ZOFRAN) 4 MG tablet Completed Course   terbinafine (LAMISIL AT) 1 % cream Completed Course   mometasone-formoterol (DULERA) 100-5 MCG/ACT inhaler 2 puff     Social History:  reports that he quit smoking about 12 years ago. His smoking use included cigarettes. He has never used smokeless tobacco. He reports that he does not drink alcohol and does not use drugs.  Family History:   Family History  Problem Relation Age of Onset   Hypertension Mother    Diabetes Mother    Heart disease Mother    Alzheimer's disease Mother    Arthritis Mother    Anemia Father    Kidney disease Brother    Colon cancer Neg Hx    Colon polyps Neg Hx    Esophageal cancer Neg Hx    Rectal cancer Neg Hx    Stomach cancer Neg Hx     Blood pressure 129/85, pulse 79, temperature 98.5 F (36.9 C), temperature source Oral, resp. rate 20, height 6' (1.829 m), weight 81.9 kg, SpO2 94%. Physical Exam: General: mild increased respiratory rate, alert and oriented x 3 HEENT: No conjunctival pallor  or scleral icterus or oral ulcers Respiratory: Rales at the bases CV: Regular rate and rhythm GI: Soft nondistended nontender positive bowel sounds no rebound or guarding Ext 1+ bilateral lower extremity edema Skn: No rashes Vascular Access: Left AV fistula aneurysmal but positive bruit and thrill     Michall Noffke, Len Blalock, MD 11/30/2023, 11:56 AM

## 2023-11-30 NOTE — Progress Notes (Signed)
[facility] night cross cover note:   I was notified by RN that the patient is complaining of a productive cough and requesting medication for this.  I subsequently ordered scheduled guaifenesin, prn Tessalon Perles, flutter valve, and incentive spirometry.     Newton Pigg, DO Hospitalist

## 2023-11-30 NOTE — Plan of Care (Signed)

## 2023-11-30 NOTE — Progress Notes (Signed)
PROGRESS NOTE  Johnny [name]  ZOX:[id number] DOB: 1961-09-10 DOA: 11/28/2023 PCP: Hoy Register, MD   Brief Narrative: Patient is a 63 year old male with history of CKD stage IV not on dialysis, AV graft in place, HFpEF, COPD, OSA, gout, hypertension, hyperlipidemia who presented to the ED with complaint of severe shortness of breath, cough.  On presentation creatinine was 14.9, BNP was 2733, hemoglobin was 6.5.  FOBT negative.  Chest x-ray with vascular congestion and diffuse interstitial groundglass opacity.  Suspected from pulm overload.  Nephrology consulted, started on serial dialysis.  Pending outpatient dialysis facility arrangement  Assessment & Plan:  Principal Problem:   Acute heart failure with preserved ejection fraction (HFpEF) (HCC) Active Problems:   CKD (chronic kidney disease) stage 5, GFR less than 15 ml/min (HCC)   Essential hypertension   Hyperlipidemia   Hypothyroidism   Gout   COPD (chronic obstructive pulmonary disease) (HCC)   Anxiety and depression   OSA (obstructive sleep apnea)   Anemia due to chronic kidney disease    Dyspnea: Currently on room air.  Chest imaging showed features of volume overload, vascular congestion,lower extremity edema .  Significantly improved now  CKD stage V: Not on dialysis.  Nephrology consulted.  Potassium 5.4, creatinine 14.9, elevated BNP.  Has AV graft on LUE.  He has previously refused hemodialysis.  Now agreed on dialysis .Started  on serial dialysis.  Social worker consulted for arranging outpatient dialysis facility on discharge  Normocytic anemia: No evidence of acute blood loss.  Fecal occult blood negative.  Iron level optimal.  Likely this is associated CKD.  Given a unit of PRBC.  Currently hemoglobin is stable in the range of 7.  HFpEF: Last echo showed EF of 55-60% grade 2 diastolic dysfunction.  On torsemide 100 mg daily at home.  Appeared volume overloaded on presentation but not now.  Echo showed EF of 60 to  65%, grade 2 diastolic dysfunction.  Volume management per dialysis  COPD: Continue bronchodilators as needed, Dulera resumed.  Currently on room air  Hypertension: Continue Norvasc, labetalol.  Blood pressure stable today  Gout: Continue allopurinol  Hyperlipidemia: Continue Lipitor  Hypothyroidism: On levothyroxine  OSA: CPAP ordered        DVT prophylaxis:SCDs Start: 11/29/23 0457     Code Status: Full Code  Family Communication: None at the bedside  Patient status: In patient  Patient is from : Home  Anticipated discharge to: Home  Estimated DC date: After nephrology clearance, outpatient dialysis arrangement   Consultants: Nephro  Procedures: Dialysis  Antimicrobials:  Anti-infectives (From admission, onward)    None       Subjective: Patient seen and examined the bedside today.  He appears very comfortable.  Lying in bed.  On room air.  Volume status significantly improved today.  Second dialysis session planned for today.  No new complaints Objective: Vitals:   11/30/23 0323 11/30/23 0455 11/30/23 0818 11/30/23 0943  BP:  139/85 122/73 129/85  Pulse:   80 79  Resp:  18 20   Temp:  98.3 F (36.8 C) 98.5 F (36.9 C)   TempSrc:  Oral Oral   SpO2:   94%   Weight: 81.9 kg     Height:        Intake/Output Summary (Last 24 hours) at 11/30/2023 1058 Last data filed at 11/30/2023 0458 Gross per 24 hour  Intake --  Output 2500 ml  Net -2500 ml   Filed Weights   11/29/23 1635 11/29/23  1955 11/30/23 0323  Weight: 84 kg 82.5 kg 81.9 kg    Examination:  General exam: Overall comfortable, not in distress HEENT: PERRL Respiratory system:  no wheezes or crackles  Cardiovascular system: S1 & S2 heard, RRR.  Gastrointestinal system: Abdomen is nondistended, soft and nontender. Central nervous system: Alert and oriented Extremities: No edema, no clubbing ,no cyanosis, AV fistula on the left upper extremity Skin: No rashes, no ulcers,no icterus      Data Reviewed: I have personally reviewed following labs and imaging studies  CBC: Recent Labs  Lab 11/28/23 2351 11/29/23 0843 11/30/23 0245  WBC 7.1 8.8 7.0  NEUTROABS 4.6 7.6  --   HGB 6.5* 7.3* 7.3*  HCT 19.9* 21.8* 22.0*  MCV 70.8* 72.7* 71.2*  PLT 141* 135* 135*   Basic Metabolic Panel: Recent Labs  Lab 11/28/23 2351 11/29/23 0138 11/30/23 0245  NA 136  --  135  K 5.4*  --  4.1  CL 102  --  100  CO2 22  --  23  GLUCOSE 100*  --  85  BUN 103*  --  69*  CREATININE 14.90*  --  10.42*  CALCIUM 7.9*  --  8.1*  MG  --  1.9  --      No results found for this or any previous visit (from the past 240 hours).   Radiology Studies: ECHOCARDIOGRAM COMPLETE Result Date: 11/29/2023    ECHOCARDIOGRAM REPORT   Patient Name:   Johnny [name] Date of Exam: 11/29/2023 Medical Rec #:  [id number]     Height:       72.0 in Accession #:    [id number]    Weight:       180.0 lb Date of Birth:  [birth year]     BSA:          2.037 m Patient Age:    62 years      BP:           141/97 mmHg Patient Gender: M             HR:           80 bpm. Exam Location:  Inpatient Procedure: 2D Echo, Cardiac Doppler and Color Doppler Indications:    CHF-Acute Diastolic I50.31  History:        Patient has prior history of Echocardiogram examinations, most                 recent 09/21/2022. CHF, COPD; Risk Factors:Hypertension,                 Diabetes and Sleep Apnea.  Sonographer:    Darlys Gales Referring Phys: [phone] DEBBY CROSLEY IMPRESSIONS  1. Left ventricular ejection fraction, by estimation, is 60 to 65%. The left ventricle has normal function. The left ventricle has no regional wall motion abnormalities. The left ventricular internal cavity size was mildly dilated. Left ventricular diastolic parameters are consistent with Grade II diastolic dysfunction (pseudonormalization).  2. Right ventricular systolic function is normal. The right ventricular size is normal. There is normal pulmonary artery systolic  pressure.  3. Left atrial size was severely dilated.  4. Right atrial size was moderately dilated.  5. The mitral valve is normal in structure. Mild mitral valve regurgitation. No evidence of mitral stenosis.  6. The aortic valve is tricuspid. Aortic valve regurgitation is not visualized. No aortic stenosis is present.  7. The inferior vena cava is normal in size with greater than 50% respiratory variability, suggesting  right atrial pressure of 3 mmHg. FINDINGS  Left Ventricle: Left ventricular ejection fraction, by estimation, is 60 to 65%. The left ventricle has normal function. The left ventricle has no regional wall motion abnormalities. The left ventricular internal cavity size was mildly dilated. There is  no left ventricular hypertrophy. Left ventricular diastolic parameters are consistent with Grade II diastolic dysfunction (pseudonormalization). Right Ventricle: The right ventricular size is normal. No increase in right ventricular wall thickness. Right ventricular systolic function is normal. There is normal pulmonary artery systolic pressure. The tricuspid regurgitant velocity is 1.02 m/s, and  with an assumed right atrial pressure of 15 mmHg, the estimated right ventricular systolic pressure is 19.2 mmHg. Left Atrium: Left atrial size was severely dilated. Right Atrium: Right atrial size was moderately dilated. Pericardium: There is no evidence of pericardial effusion. Mitral Valve: The mitral valve is normal in structure. Mild mitral valve regurgitation. No evidence of mitral valve stenosis. Tricuspid Valve: The tricuspid valve is normal in structure. Tricuspid valve regurgitation is mild . No evidence of tricuspid stenosis. Aortic Valve: The aortic valve is tricuspid. Aortic valve regurgitation is not visualized. No aortic stenosis is present. Aortic valve peak gradient measures 9.0 mmHg. Pulmonic Valve: The pulmonic valve was normal in structure. Pulmonic valve regurgitation is not visualized. No  evidence of pulmonic stenosis. Aorta: The aortic root is normal in size and structure. Venous: The inferior vena cava is normal in size with greater than 50% respiratory variability, suggesting right atrial pressure of 3 mmHg. IAS/Shunts: No atrial level shunt detected by color flow Doppler.  LEFT VENTRICLE PLAX 2D LVIDd:         6.10 cm   Diastology LV PW:         2.20 cm   LV e' medial:    7.51 cm/s LV IVS:        0.60 cm   LV E/e' medial:  13.4 LVOT diam:     2.00 cm   LV e' lateral:   8.49 cm/s LV SV:         98        LV E/e' lateral: 11.9 LV SV Index:   48 LVOT Area:     3.14 cm  RIGHT VENTRICLE             IVC RV S prime:     11.60 cm/s  IVC diam: 2.10 cm TAPSE (M-mode): 2.5 cm LEFT ATRIUM             Index        RIGHT ATRIUM           Index LA Vol (A2C):   99.7 ml 48.94 ml/m  RA Area:     23.10 cm LA Vol (A4C):   97.1 ml 47.66 ml/m  RA Volume:   70.50 ml  34.61 ml/m LA Biplane Vol: 99.0 ml 48.59 ml/m  AORTIC VALVE AV Area (Vmax): 3.14 cm AV Vmax:        150.00 cm/s AV Peak Grad:   9.0 mmHg LVOT Vmax:      150.00 cm/s LVOT Vmean:     107.000 cm/s LVOT VTI:       0.311 m MITRAL VALVE                TRICUSPID VALVE MV Area (PHT): 3.89 cm     TR Peak grad:   4.2 mmHg MV Decel Time: 195 msec     TR Vmax:  102.00 cm/s MV E velocity: 101.00 cm/s MV A velocity: 61.80 cm/s   SHUNTS MV E/A ratio:  1.63         Systemic VTI:  0.31 m                             Systemic Diam: 2.00 cm Chilton Si MD Electronically signed by Chilton Si MD Signature Date/Time: 11/29/2023/4:33:39 PM    Final    DG Chest 2 View Result Date: 11/29/2023 CLINICAL DATA:  Shortness of breath EXAM: CHEST - 2 VIEW COMPARISON:  09/20/2022 FINDINGS: Cardiomegaly with vascular congestion and diffuse interstitial and ground-glass opacity favored to represent pulmonary edema. Small bilateral effusions. No pneumothorax. Globular cardiac configuration. IMPRESSION: Cardiomegaly with vascular congestion and diffuse interstitial  and ground-glass opacity favored to represent pulmonary edema. Small bilateral effusions. Globular cardiac configuration, cannot exclude pericardial effusion Electronically Signed   By: Jasmine Pang M.D.   On: 11/29/2023 00:15    Scheduled Meds:  allopurinol  100 mg Oral Daily   amLODipine  10 mg Oral Daily   atorvastatin  40 mg Oral Daily   Chlorhexidine Gluconate Cloth  6 each Topical Q0600   furosemide  80 mg Intravenous BID   guaiFENesin  600 mg Oral BID   labetalol  300 mg Oral BID   levothyroxine  100 mcg Oral Daily   mometasone-formoterol  2 puff Inhalation BID   sodium bicarbonate  1,300 mg Oral BID   triamcinolone cream   Topical BID   Continuous Infusions:   LOS: 1 day   Burnadette Pop, MD [facility] P1/25/2025, 10:58 AM

## 2023-12-01 ENCOUNTER — Inpatient Hospital Stay (HOSPITAL_COMMUNITY): Payer: MEDICAID

## 2023-12-01 DIAGNOSIS — I5031 Acute diastolic (congestive) heart failure: Secondary | ICD-10-CM | POA: Diagnosis not present

## 2023-12-01 LAB — BASIC METABOLIC PANEL
Anion gap: 11 (ref 5–15)
BUN: 53 mg/dL — ABNORMAL HIGH (ref 6–20)
CO2: 26 mmol/L (ref 22–32)
Calcium: 7.9 mg/dL — ABNORMAL LOW (ref 8.9–10.3)
Chloride: 99 mmol/L (ref 98–111)
Creatinine, Ser: 8.09 mg/dL — ABNORMAL HIGH (ref 0.61–1.24)
GFR, Estimated: 7 mL/min — ABNORMAL LOW (ref >60)
Glucose, Bld: 89 mg/dL (ref 70–99)
Potassium: 4.2 mmol/L (ref 3.5–5.1)
Sodium: 136 mmol/L (ref 135–145)

## 2023-12-01 LAB — MAGNESIUM: Magnesium: 1.7 mg/dL (ref 1.7–2.4)

## 2023-12-01 MED ORDER — MAGNESIUM SULFATE 2 GM/50ML IV SOLN
2.0000 g | Freq: Once | INTRAVENOUS | Status: AC
  Administered 2023-12-01: 2 g via INTRAVENOUS
  Filled 2023-12-01: qty 50

## 2023-12-01 MED ORDER — CHLORHEXIDINE GLUCONATE CLOTH 2 % EX PADS
6.0000 | MEDICATED_PAD | Freq: Every day | CUTANEOUS | Status: DC
Start: 2023-12-02 — End: 2023-12-03
  Administered 2023-12-02 – 2023-12-03 (×2): 6 via TOPICAL

## 2023-12-01 NOTE — Progress Notes (Addendum)
PROGRESS NOTE  Johnny [name]  ZOX:[id number] DOB: 1961/04/11 DOA: 11/28/2023 PCP: Hoy Register, MD   Brief Narrative: Patient is a 63 year old male with history of CKD stage IV not on dialysis, AV graft in place, HFpEF, COPD, OSA, gout, hypertension, hyperlipidemia who presented to the ED with complaint of severe shortness of breath, cough.  On presentation creatinine was 14.9, BNP was 2733, hemoglobin was 6.5.  FOBT negative.  Chest x-ray with vascular congestion and diffuse interstitial groundglass opacity.  Suspected from pulm overload.  Nephrology consulted, started on serial dialysis.  Pending outpatient dialysis facility arrangement  Assessment & Plan:  Principal Problem:   Acute heart failure with preserved ejection fraction (HFpEF) (HCC) Active Problems:   CKD (chronic kidney disease) stage 5, GFR less than 15 ml/min (HCC)   Essential hypertension   Hyperlipidemia   Hypothyroidism   Gout   COPD (chronic obstructive pulmonary disease) (HCC)   Anxiety and depression   OSA (obstructive sleep apnea)   Anemia due to chronic kidney disease    Dyspnea: Currently on room air.  Chest imaging showed features of volume overload, vascular congestion,lower extremity edema .  Significantly improved now  CKD stage V: Not on dialysis.  Nephrology consulted.  Potassium 5.4, creatinine 14.9, elevated BNP.  Has AV graft on LUE.  He has previously refused hemodialysis.  Now agreed on dialysis .Started  on serial dialysis.  Social worker consulted for arranging outpatient dialysis facility on discharge  Normocytic anemia: No evidence of acute blood loss.  Fecal occult blood negative.  Iron level optimal.  Likely this is associated CKD.  Given a unit of PRBC.  Currently hemoglobin is stable in the range of 7.  On IV iron  HFpEF: Last echo showed EF of 55-60% grade 2 diastolic dysfunction.  On torsemide 100 mg daily at home.  Appeared volume overloaded on presentation but not now.  Echo showed EF  of 60 to 65%, grade 2 diastolic dysfunction.  Volume management per dialysis.On lasix iv   COPD: Continue bronchodilators as needed, Dulera resumed.  Currently on room air  Hypertension: Continue Norvasc, labetalol.  Blood pressure stable today  Gout: Continue allopurinol, added colchicine  Hyperlipidemia: Continue Lipitor  Hypothyroidism: On levothyroxine  OSA: CPAP ordered        DVT prophylaxis:SCDs Start: 11/29/23 0457     Code Status: Full Code  Family Communication: None at the bedside  Patient status: In patient  Patient is from : Home  Anticipated discharge to: Home  Estimated DC date: After nephrology clearance, outpatient dialysis arrangement   Consultants: Nephro  Procedures: Dialysis  Antimicrobials:  Anti-infectives (From admission, onward)    None       Subjective: Patient seen and examined at bedside today.  Comfortable.  No new complaints.  He had some pain on the right feet but already better during my evaluation.  Objective: Vitals:   12/01/23 0045 12/01/23 0424 12/01/23 0753 12/01/23 0831  BP: 133/89 127/74 130/78   Pulse: 80 87 81 80  Resp: 18 18 20 19   Temp: 98.6 F (37 C) 98.9 F (37.2 C) 98.7 F (37.1 C)   TempSrc: Oral Oral Oral   SpO2: 97% 96% 98% 98%  Weight:  78.2 kg    Height:        Intake/Output Summary (Last 24 hours) at 12/01/2023 1012 Last data filed at 12/01/2023 0755 Gross per 24 hour  Intake 1200 ml  Output 2925 ml  Net -1725 ml   [employer]  11/30/23 0323 11/30/23 1822 12/01/23 0424  Weight: 81.9 kg 79.5 kg 78.2 kg    Examination:  General exam: Overall comfortable, not in distress HEENT: PERRL Respiratory system:  no wheezes or crackles  Cardiovascular system: S1 & S2 heard, RRR.  Gastrointestinal system: Abdomen is nondistended, soft and nontender. Central nervous system: Alert and oriented Extremities: No edema, no clubbing ,no cyanosis, AV fistula on the left upper extremity Skin: No  rashes, no ulcers,no icterus     Data Reviewed: I have personally reviewed following labs and imaging studies  CBC: Recent Labs  Lab 11/28/23 2351 11/29/23 0843 11/30/23 0245  WBC 7.1 8.8 7.0  NEUTROABS 4.6 7.6  --   HGB 6.5* 7.3* 7.3*  HCT 19.9* 21.8* 22.0*  MCV 70.8* 72.7* 71.2*  PLT 141* 135* 135*   Basic Metabolic Panel: Recent Labs  Lab 11/28/23 2351 11/29/23 0138 11/30/23 0245 11/30/23 1336 12/01/23 0227  NA 136  --  135  --  136  K 5.4*  --  4.1  --  4.2  CL 102  --  100  --  99  CO2 22  --  23  --  26  GLUCOSE 100*  --  85  --  89  BUN 103*  --  69*  --  53*  CREATININE 14.90*  --  10.42*  --  8.09*  CALCIUM 7.9*  --  8.1*  --  7.9*  MG  --  1.9  --   --   --   PHOS  --   --   --  4.9*  --      No results found for this or any previous visit (from the past 240 hours).   Radiology Studies: ECHOCARDIOGRAM COMPLETE Result Date: 11/29/2023    ECHOCARDIOGRAM REPORT   Patient Name:   Johnny [name] Date of Exam: 11/29/2023 Medical Rec #:  [id number]     Height:       72.0 in Accession #:    [id number]    Weight:       180.0 lb Date of Birth:  1961/10/07     BSA:          2.037 m Patient Age:    62 years      BP:           141/97 mmHg Patient Gender: M             HR:           80 bpm. Exam Location:  Inpatient Procedure: 2D Echo, Cardiac Doppler and Color Doppler Indications:    CHF-Acute Diastolic I50.31  History:        Patient has prior history of Echocardiogram examinations, most                 recent 09/21/2022. CHF, COPD; Risk Factors:Hypertension,                 Diabetes and Sleep Apnea.  Sonographer:    Darlys Gales Referring Phys: [phone] DEBBY CROSLEY IMPRESSIONS  1. Left ventricular ejection fraction, by estimation, is 60 to 65%. The left ventricle has normal function. The left ventricle has no regional wall motion abnormalities. The left ventricular internal cavity size was mildly dilated. Left ventricular diastolic parameters are consistent with Grade II  diastolic dysfunction (pseudonormalization).  2. Right ventricular systolic function is normal. The right ventricular size is normal. There is normal pulmonary artery systolic pressure.  3. Left atrial size was severely dilated.  4. Right atrial size was moderately dilated.  5. The mitral valve is normal in structure. Mild mitral valve regurgitation. No evidence of mitral stenosis.  6. The aortic valve is tricuspid. Aortic valve regurgitation is not visualized. No aortic stenosis is present.  7. The inferior vena cava is normal in size with greater than 50% respiratory variability, suggesting right atrial pressure of 3 mmHg. FINDINGS  Left Ventricle: Left ventricular ejection fraction, by estimation, is 60 to 65%. The left ventricle has normal function. The left ventricle has no regional wall motion abnormalities. The left ventricular internal cavity size was mildly dilated. There is  no left ventricular hypertrophy. Left ventricular diastolic parameters are consistent with Grade II diastolic dysfunction (pseudonormalization). Right Ventricle: The right ventricular size is normal. No increase in right ventricular wall thickness. Right ventricular systolic function is normal. There is normal pulmonary artery systolic pressure. The tricuspid regurgitant velocity is 1.02 m/s, and  with an assumed right atrial pressure of 15 mmHg, the estimated right ventricular systolic pressure is 19.2 mmHg. Left Atrium: Left atrial size was severely dilated. Right Atrium: Right atrial size was moderately dilated. Pericardium: There is no evidence of pericardial effusion. Mitral Valve: The mitral valve is normal in structure. Mild mitral valve regurgitation. No evidence of mitral valve stenosis. Tricuspid Valve: The tricuspid valve is normal in structure. Tricuspid valve regurgitation is mild . No evidence of tricuspid stenosis. Aortic Valve: The aortic valve is tricuspid. Aortic valve regurgitation is not visualized. No aortic  stenosis is present. Aortic valve peak gradient measures 9.0 mmHg. Pulmonic Valve: The pulmonic valve was normal in structure. Pulmonic valve regurgitation is not visualized. No evidence of pulmonic stenosis. Aorta: The aortic root is normal in size and structure. Venous: The inferior vena cava is normal in size with greater than 50% respiratory variability, suggesting right atrial pressure of 3 mmHg. IAS/Shunts: No atrial level shunt detected by color flow Doppler.  LEFT VENTRICLE PLAX 2D LVIDd:         6.10 cm   Diastology LV PW:         2.20 cm   LV e' medial:    7.51 cm/s LV IVS:        0.60 cm   LV E/e' medial:  13.4 LVOT diam:     2.00 cm   LV e' lateral:   8.49 cm/s LV SV:         98        LV E/e' lateral: 11.9 LV SV Index:   48 LVOT Area:     3.14 cm  RIGHT VENTRICLE             IVC RV S prime:     11.60 cm/s  IVC diam: 2.10 cm TAPSE (M-mode): 2.5 cm LEFT ATRIUM             Index        RIGHT ATRIUM           Index LA Vol (A2C):   99.7 ml 48.94 ml/m  RA Area:     23.10 cm LA Vol (A4C):   97.1 ml 47.66 ml/m  RA Volume:   70.50 ml  34.61 ml/m LA Biplane Vol: 99.0 ml 48.59 ml/m  AORTIC VALVE AV Area (Vmax): 3.14 cm AV Vmax:        150.00 cm/s AV Peak Grad:   9.0 mmHg LVOT Vmax:      150.00 cm/s LVOT Vmean:     107.000 cm/s LVOT VTI:  0.311 m MITRAL VALVE                TRICUSPID VALVE MV Area (PHT): 3.89 cm     TR Peak grad:   4.2 mmHg MV Decel Time: 195 msec     TR Vmax:        102.00 cm/s MV E velocity: 101.00 cm/s MV A velocity: 61.80 cm/s   SHUNTS MV E/A ratio:  1.63         Systemic VTI:  0.31 m                             Systemic Diam: 2.00 cm Chilton Si MD Electronically signed by Chilton Si MD Signature Date/Time: 11/29/2023/4:33:39 PM    Final     Scheduled Meds:  allopurinol  100 mg Oral Daily   amLODipine  10 mg Oral Daily   atorvastatin  40 mg Oral Daily   Chlorhexidine Gluconate Cloth  6 each Topical Q0600   furosemide  80 mg Intravenous BID   guaiFENesin  600 mg  Oral BID   labetalol  300 mg Oral BID   levothyroxine  100 mcg Oral Daily   mometasone-formoterol  2 puff Inhalation BID   sodium bicarbonate  1,300 mg Oral BID   triamcinolone cream   Topical BID   Continuous Infusions:  [START ON 12/02/2023] iron sucrose       LOS: 2 days   Burnadette Pop, MD [facility] P1/26/2025, 10:12 AM

## 2023-12-01 NOTE — Progress Notes (Signed)
Johnny [name] is an 63 y.o. male with a history of substance abuse, HLD, HTN, HCV, HFpEF, COPD, OSA, gout, CKD 5 followed by Dr. Valentino Nose but last seen by Dr. Signe Colt.  Patient had agreed to start dialysis but changed his mind and refused subsequently.  Normally on torsemide 100 mg daily and was actually referred to [facility] was on undergoing transplant evaluation.  Here with SOB. In the ED Cr 14.9 and BNP of 2733 with a hemoglobin of 6.5.  Chest x-ray showed vascular congestion.   Assessment/Plan: CKD5 by Dr. Valentino Nose last seen by Dr. Signe Colt.  Patient has been told numerous times he needs to be on dialysis and actually finally went to [facility] unit for a tour recently but declined to start.  Counseled him at length that he really needs to start, creatinine is even higher than baseline and he did not have edema during his last clinic visit.  He has 2+ edema in lower extremities now as well as vascular congestion and certainly needs to be started on dialysis.  Agreeable. On bicarbonate 2 tablets twice daily at home. -Tolerated 1st dialysis Fri nad #2 Sat 3-hour low efficiency treatment to decrease the risk of dialysis disequilibrium.  Plan MWF regimen with next HD on Monday. -CLIP process already initiated   Additional recommendations - Dose all meds for creatinine clearance < 10 ml/min  - Unless absolutely necessary, no MRIs with gadolinium.  - Prefer needle sticks in the dorsum of the hands or wrists.  No blood pressure measurements in right arm. - If blood transfusion is requested during hemodialysis sessions, please alert [place] prior to the session.    Avoid nephrotoxic medications including NSAIDs and iodinated intravenous contrast exposure unless the latter is absolutely indicated.  Preferred narcotic agents for pain control are hydromorphone, fentanyl, and methadone. Morphine should not be used. Avoid Baclofen and avoid oral sodium phosphate and magnesium citrate based laxatives / bowel preps.  Continue strict Input and Output monitoring. Will monitor the patient closely with you and intervene or adjust therapy as indicated by changes in clinical status/labs    HFpEF EF 60 to 65% -usually on torsemide 100 mg daily at home Renal osteodystrophy -calcitriol 0.5 mg 2 tablets daily.  Currently not on a binder.  Will check a phosphorus level and almost certainly will need to start a binder Anemia - Retacrit 30,000 units every 2 weeks. -TSAT 21%, no ferritin -> will load with 500mg  of iv iron 1st Hypertension on amlodipine 10 mg, labetalol 300 mg twice daily COPD -breathing treatments per primary team OSA Hypothyroidism -on Synthroid  Subjective:  Breathing is markedly improved.     Denies fever chills nausea vomiting chest pain.   Chemistry and CBC: Creat  Date/Time Value Ref Range Status  12/07/2016 10:06 AM 2.78 (H) 0.70 - 1.33 mg/dL Final    Comment:      For patients > or = 61 years of age: The upper reference limit for Creatinine is approximately 13% higher for people identified as African-American.      Creatinine, Ser  Date/Time Value Ref Range Status  12/01/2023 02:27 AM 8.09 (H) 0.61 - 1.24 mg/dL Final  16/08/9603 54:09 AM 10.42 (H) 0.61 - 1.24 mg/dL Final  81/19/1478 29:56 PM 14.90 (H) 0.61 - 1.24 mg/dL Final  21/30/8657 84:69 AM 13.85 (H) 0.61 - 1.24 mg/dL Final  62/95/2841 32:44 AM 13.88 (H) 0.61 - 1.24 mg/dL Final  11/07/7251 66:44 AM 10.80 (H) 0.61 - 1.24 mg/dL Final  03/47/4259  09:21 AM 11.30 (H) 0.61 - 1.24 mg/dL Final  40/98/1191 47:82 AM 11.12 (H) 0.61 - 1.24 mg/dL Final  95/62/1308 65:78 AM 11.79 (H) 0.61 - 1.24 mg/dL Final  46/96/2952 84:13 PM 11.23 (H) 0.61 - 1.24 mg/dL Final  24/40/1027 25:36 AM 10.25 (H) 0.61 - 1.24 mg/dL Final  64/40/3474 25:95 AM 11.22 (H) 0.61 - 1.24 mg/dL Final  63/87/5643 32:95 AM 10.12 (H) 0.61 - 1.24 mg/dL Final  18/84/1660 63:01 AM 9.50 (H) 0.61 - 1.24 mg/dL Final  60/08/9322 55:73 AM 9.35 (H) 0.61 - 1.24 mg/dL Final   22/12/5425 06:23 AM 9.00 (H) 0.61 - 1.24 mg/dL Final  76/28/3151 76:16 AM 8.69 (H) 0.61 - 1.24 mg/dL Final  07/37/1062 69:48 AM 8.20 (H) 0.61 - 1.24 mg/dL Final  54/62/7035 00:93 PM 8.28 (H) 0.61 - 1.24 mg/dL Final  81/82/9937 16:96 AM 7.34 (H) 0.61 - 1.24 mg/dL Final  78/93/8101 75:10 PM 7.43 (H) 0.61 - 1.24 mg/dL Final  25/85/2778 24:23 PM 7.92 (H) 0.61 - 1.24 mg/dL Final  53/61/4431 54:00 AM 8.29 (H) 0.61 - 1.24 mg/dL Final  86/76/1950 93:26 AM 7.93 (H) 0.61 - 1.24 mg/dL Final  71/24/5809 98:33 AM 6.02 (H) 0.61 - 1.24 mg/dL Final  82/50/5397 67:34 AM 6.03 (H) 0.61 - 1.24 mg/dL Final  19/37/9024 09:73 AM 6.80 (H) 0.61 - 1.24 mg/dL Final  53/29/9242 68:34 PM 6.04 (H) 0.61 - 1.24 mg/dL Final  19/62/2297 98:92 AM 4.74 (H) 0.61 - 1.24 mg/dL Final  11/94/1740 81:44 PM 5.39 (H) 0.76 - 1.27 mg/dL Final  81/85/6314 97:02 PM 3.41 (H) 0.76 - 1.27 mg/dL Final  63/78/5885 02:77 PM 4.26 (H) 0.76 - 1.27 mg/dL Final  41/28/7867 67:20 AM 3.37 (H) 0.76 - 1.27 mg/dL Final  94/70/9628 36:62 AM 2.91 (H) 0.76 - 1.27 mg/dL Final  94/76/5465 03:54 PM 3.04 (H) 0.76 - 1.27 mg/dL Final  65/68/1275 17:00 AM 2.32 (H) 0.61 - 1.24 mg/dL Final  17/49/4496 75:91 AM 2.12 (H) 0.61 - 1.24 mg/dL Final  63/84/6659 93:57 PM 2.22 (H) 0.61 - 1.24 mg/dL Final  01/77/9390 30:09 PM 3.21 (H) 0.61 - 1.24 mg/dL Final  23/30/0762 26:33 PM 2.62 (H) 0.50 - 1.35 mg/dL Final  35/45/6256 38:93 PM 3.4 (H) 0.4 - 1.5 mg/dL Final  73/42/8768 11:57 AM 4.46 (H) 0.4 - 1.5 mg/dL Final  26/20/3559 74:16 AM 4.14 (H) 0.4 - 1.5 mg/dL Final  38/45/3646 80:32 AM 3.89 (H) 0.4 - 1.5 mg/dL Final  10/28/8249 03:70 AM 4.19 (H) 0.4 - 1.5 mg/dL Final  48/88/9169 45:03 PM 4.14 (H) 0.4 - 1.5 mg/dL Final  88/82/8003 49:17 PM 4.23 (H) 0.4 - 1.5 mg/dL Final  91/50/5697 94:80 PM 4.7 (H) 0.4 - 1.5 mg/dL Final   Recent Labs  Lab 11/28/23 2351 11/30/23 0245 11/30/23 1336 12/01/23 0227  NA 136 135  --  136  K 5.4* 4.1  --  4.2  CL 102 100  --  99  CO2  22 23  --  26  GLUCOSE 100* 85  --  89  BUN 103* 69*  --  53*  CREATININE 14.90* 10.42*  --  8.09*  CALCIUM 7.9* 8.1*  --  7.9*  PHOS  --   --  4.9*  --    Recent Labs  Lab 11/28/23 2351 11/29/23 0843 11/30/23 0245  WBC 7.1 8.8 7.0  NEUTROABS 4.6 7.6  --   HGB 6.5* 7.3* 7.3*  HCT 19.9* 21.8* 22.0*  MCV 70.8* 72.7* 71.2*  PLT 141* 135* 135*   Liver Function  Tests: Recent Labs  Lab 11/28/23 2351  AST 24  ALT 18  ALKPHOS 59  BILITOT 0.9  PROT 7.2  ALBUMIN 3.3*   No results for input(s): "LIPASE", "AMYLASE" in the last 168 hours. No results for input(s): "AMMONIA" in the last 168 hours. Cardiac Enzymes: No results for input(s): "CKTOTAL", "CKMB", "CKMBINDEX", "TROPONINI" in the last 168 hours. Iron Studies:  Recent Labs    11/29/23 0138 11/30/23 1336  IRON 58  --   TIBC 273  --   FERRITIN  --  583*   PT/INR: @LABRCNTIP (inr:5)  Xrays/Other Studies: ) Results for orders placed or performed during the hospital encounter of 11/28/23 (from the past 48 hours)  Basic metabolic panel     Status: Abnormal   Collection Time: 11/30/23  2:45 AM  Result Value Ref Range   Sodium 135 135 - 145 mmol/L   Potassium 4.1 3.5 - 5.1 mmol/L   Chloride 100 98 - 111 mmol/L   CO2 23 22 - 32 mmol/L   Glucose, Bld 85 70 - 99 mg/dL    Comment: Glucose reference range applies only to samples taken after fasting for at least 8 hours.   BUN 69 (H) 6 - 20 mg/dL   Creatinine, Ser 82.95 (H) 0.61 - 1.24 mg/dL   Calcium 8.1 (L) 8.9 - 10.3 mg/dL   GFR, Estimated 5 (L) >60 mL/min    Comment: (NOTE) Calculated using the CKD-EPI Creatinine Equation (2021)    Anion gap 12 5 - 15    Comment: Performed at [facility], [address], [place], [place] [address]  CBC     Status: Abnormal   Collection Time: 11/30/23  2:45 AM  Result Value Ref Range   WBC 7.0 4.0 - 10.5 K/uL   RBC 3.09 (L) 4.22 - 5.81 MIL/uL   Hemoglobin 7.3 (L) 13.0 - 17.0 g/dL    Comment: Reticulocyte Hemoglobin  testing may be clinically indicated, consider ordering this additional test [id number]    HCT 22.0 (L) 39.0 - 52.0 %   MCV 71.2 (L) 80.0 - 100.0 fL   MCH 23.6 (L) 26.0 - 34.0 pg   MCHC 33.2 30.0 - 36.0 g/dL   RDW 62.9 (H) 52.8 - 41.3 %   Platelets 135 (L) 150 - 400 K/uL    Comment: REPEATED TO VERIFY   nRBC 0.0 0.0 - 0.2 %    Comment: Performed at [facility], [address], [place], [place] [address]  Ferritin     Status: Abnormal   Collection Time: 11/30/23  1:36 PM  Result Value Ref Range   Ferritin 583 (H) 24 - 336 ng/mL    Comment: Performed at [facility], [address], [place], [place] [address]  Phosphorus     Status: Abnormal   Collection Time: 11/30/23  1:36 PM  Result Value Ref Range   Phosphorus 4.9 (H) 2.5 - 4.6 mg/dL    Comment: Performed at [facility], [address], [place], [place] [address]  Basic metabolic panel     Status: Abnormal   Collection Time: 12/01/23  2:27 AM  Result Value Ref Range   Sodium 136 135 - 145 mmol/L   Potassium 4.2 3.5 - 5.1 mmol/L   Chloride 99 98 - 111 mmol/L   CO2 26 22 - 32 mmol/L   Glucose, Bld 89 70 - 99 mg/dL    Comment: Glucose reference range applies only to samples taken after fasting for at  least 8 hours.   BUN 53 (H) 6 - 20 mg/dL   Creatinine, Ser 6.04 (H) 0.61 - 1.24 mg/dL   Calcium 7.9 (L) 8.9 - 10.3 mg/dL   GFR, Estimated 7 (L) >60 mL/min    Comment: (NOTE) Calculated using the CKD-EPI Creatinine Equation (2021)    Anion gap 11 5 - 15    Comment: Performed at [facility], [address], [place], [place] [address]  Magnesium     Status: None   Collection Time: 12/01/23  2:27 AM  Result Value Ref Range   Magnesium 1.7 1.7 - 2.4 mg/dL    Comment: Performed at [facility], [address] [place], [place] [address]   DG CHEST PORT 1 VIEW Result Date: 12/01/2023 CLINICAL DATA:  Shortness of breath EXAM: PORTABLE CHEST 1 VIEW COMPARISON:  11/28/2023 FINDINGS: Cardiomegaly.  Pulmonary vascular prominence without overt edema. No acute osseous findings. IMPRESSION: Cardiomegaly with pulmonary vascular prominence but without overt edema. No focal airspace opacity. Electronically Signed   By: Jearld Lesch M.D.   On: 12/01/2023 10:17    PMH:   Past Medical History:  Diagnosis Date   Adenomatous colon polyp    tubular   Anemia    Asthma    as a child   CHF (congestive heart failure) (HCC)    Chronic kidney disease    COPD (chronic obstructive pulmonary disease) (HCC)    Diverticulosis    External otitis of right ear 11/10/2018   Gout    Hepatitis C    HLD (hyperlipidemia)    Hypertension    OSA (obstructive sleep apnea) 11/03/2015   Sleep apnea    Strep throat 01/2021   Thyroid disorder     PSH:   Past Surgical History:  Procedure Laterality Date   A/V FISTULAGRAM N/A 11/08/2023   Procedure: A/V Fistulagram;  Surgeon: Leonie Douglas, MD;  Location: [facility] INVASIVE CV LAB;  Service: Vascular;  Laterality: N/A;   AV FISTULA PLACEMENT  09/12/2009   Left arm AVF    Allergies:  Allergies  Allergen Reactions   Heparin     Pt reports he's not sure what type of reaction   Naproxen Other (See Comments)    Unknown reaction    Shellfish Allergy Itching and Swelling    Medications:   Prior to Admission medications   Medication Sig Start Date End Date Taking? Authorizing Provider  albuterol (VENTOLIN HFA) 108 (90 Base) MCG/ACT inhaler Inhale 2 puffs into the lungs every 6 (six) hours as needed for wheezing or shortness of breath. 12/01/21  Yes Ghimire, Lyndel Safe, MD  allopurinol (ZYLOPRIM) 100 MG tablet Take 1 tablet (100 mg total) by mouth daily. 02/06/23  Yes Hoy Register, MD  amLODipine (NORVASC) 10 MG tablet Take 1 tablet (10 mg total) by mouth daily. 11/20/23  Yes Georgian Co M, PA-C  atorvastatin (LIPITOR) 40 MG tablet Take 1 tablet (40 mg total) by mouth daily. 11/20/23  Yes McClung, Marzella Schlein, PA-C  colchicine 0.6 MG tablet TAKE 2 TABLETS BY MOUTH AT  THE ONSET OF A GOUT FLARE. MAY REPEAT 1 TABLET IN 1 HOUR IF SYMPTOMS PERSIST 10/15/23  Yes Newlin, Enobong, MD  ipratropium-albuterol (DUONEB) 0.5-2.5 (3) MG/3ML SOLN Inhale 3 mLs into the lungs every 6 (six) hours as needed. 11/27/23  Yes Hoy Register, MD  labetalol (NORMODYNE) 300 MG tablet Take 1 tablet by mouth twice daily 11/01/23  Yes Newlin, Enobong, MD  levothyroxine (SYNTHROID) 100 MCG tablet Take 100  mcg by mouth daily. 10/15/23  Yes [provider]  mometasone-formoterol (DULERA) 100-5 MCG/ACT AERO Inhale 2 puffs into the lungs 2 (two) times daily. 11/07/22  Yes Anders Simmonds, PA-C  mupirocin ointment (BACTROBAN) 2 % Apply 1 Application topically 2 (two) times daily. 11/07/22  Yes Anders Simmonds, PA-C  sodium bicarbonate 650 MG tablet Take 1,300 mg by mouth 2 (two) times daily. 09/06/23  Yes [provider]  torsemide (DEMADEX) 100 MG tablet Take 1 tablet (100 mg total) by mouth daily. 11/07/22 11/29/23 Yes McClung, Marzella Schlein, PA-C  triamcinolone cream (KENALOG) 0.1 % APPLY CREAM EXTERNALLY TWICE DAILY 10/23/23  Yes Hoy Register, MD    Discontinued Meds:   Medications Discontinued During This Encounter  Medication Reason   hydrOXYzine (ATARAX) 25 MG tablet Completed Course   levothyroxine (SYNTHROID) 88 MCG tablet Dose change   Misc. Devices MISC    ondansetron (ZOFRAN) 4 MG tablet Completed Course   terbinafine (LAMISIL AT) 1 % cream Completed Course   mometasone-formoterol (DULERA) 100-5 MCG/ACT inhaler 2 puff     Social History:  reports that he quit smoking about 12 years ago. His smoking use included cigarettes. He has never used smokeless tobacco. He reports that he does not drink alcohol and does not use drugs.  Family History:   Family History  Problem Relation Age of Onset   Hypertension Mother    Diabetes Mother    Heart disease Mother    Alzheimer's disease Mother    Arthritis Mother    Anemia Father    Kidney disease Brother    Colon  cancer Neg Hx    Colon polyps Neg Hx    Esophageal cancer Neg Hx    Rectal cancer Neg Hx    Stomach cancer Neg Hx     Blood pressure 119/81, pulse 76, temperature 98.5 F (36.9 C), temperature source Oral, resp. rate 20, height 6' (1.829 m), weight 78.2 kg, SpO2 98%. Physical Exam: General: mild increased respiratory rate, alert and oriented x 3 HEENT: No conjunctival pallor or scleral icterus or oral ulcers Respiratory: Rales at the bases CV: Regular rate and rhythm GI: Soft nondistended nontender positive bowel sounds no rebound or guarding Ext 1+ bilateral lower extremity edema Skn: No rashes Vascular Access: Left AV fistula aneurysmal but positive bruit and thrill     Shantil Vallejo, Len Blalock, MD 12/01/2023, 12:23 PM

## 2023-12-02 DIAGNOSIS — I5031 Acute diastolic (congestive) heart failure: Secondary | ICD-10-CM | POA: Diagnosis not present

## 2023-12-02 LAB — BASIC METABOLIC PANEL
Anion gap: 17 — ABNORMAL HIGH (ref 5–15)
BUN: 64 mg/dL — ABNORMAL HIGH (ref 6–20)
CO2: 21 mmol/L — ABNORMAL LOW (ref 22–32)
Calcium: 7.8 mg/dL — ABNORMAL LOW (ref 8.9–10.3)
Chloride: 98 mmol/L (ref 98–111)
Creatinine, Ser: 9.27 mg/dL — ABNORMAL HIGH (ref 0.61–1.24)
GFR, Estimated: 6 mL/min — ABNORMAL LOW (ref >60)
Glucose, Bld: 131 mg/dL — ABNORMAL HIGH (ref 70–99)
Potassium: 4.1 mmol/L (ref 3.5–5.1)
Sodium: 136 mmol/L (ref 135–145)

## 2023-12-02 LAB — CBC
HCT: 26.1 % — ABNORMAL LOW (ref 39.0–52.0)
Hemoglobin: 8.6 g/dL — ABNORMAL LOW (ref 13.0–17.0)
MCH: 23.8 pg — ABNORMAL LOW (ref 26.0–34.0)
MCHC: 33 g/dL (ref 30.0–36.0)
MCV: 72.1 fL — ABNORMAL LOW (ref 80.0–100.0)
Platelets: 158 10*3/uL (ref 150–400)
RBC: 3.62 MIL/uL — ABNORMAL LOW (ref 4.22–5.81)
RDW: 22.6 % — ABNORMAL HIGH (ref 11.5–15.5)
WBC: 7.4 10*3/uL (ref 4.0–10.5)
nRBC: 0 % (ref 0.0–0.2)

## 2023-12-02 LAB — PARATHYROID HORMONE, INTACT (NO CA): PTH: 70 pg/mL — ABNORMAL HIGH (ref 15–65)

## 2023-12-02 MED ORDER — LIDOCAINE HCL (PF) 1 % IJ SOLN: 5.0000 mL | INTRAMUSCULAR | Status: DC | PRN

## 2023-12-02 MED ORDER — DILTIAZEM HCL-DEXTROSE 125-5 MG/125ML-% IV SOLN (PREMIX): 5.0000 mg/h | INTRAVENOUS | Status: DC

## 2023-12-02 MED ORDER — LIDOCAINE-PRILOCAINE 2.5-2.5 % EX CREA: 1.0000 | TOPICAL_CREAM | CUTANEOUS | Status: DC | PRN

## 2023-12-02 MED ORDER — ALTEPLASE 2 MG IJ SOLR
2.0000 mg | Freq: Once | INTRAMUSCULAR | Status: DC | PRN
Start: 2023-12-02 — End: 2023-12-02

## 2023-12-02 MED ORDER — ANTICOAGULANT SODIUM CITRATE 4% (200MG/5ML) IV SOLN: 5.0000 mL | Status: DC | PRN

## 2023-12-02 MED ORDER — DILTIAZEM LOAD VIA INFUSION: 10.0000 mg | Freq: Once | INTRAVENOUS | Status: DC

## 2023-12-02 MED ORDER — PENTAFLUOROPROP-TETRAFLUOROETH EX AERO: 1.0000 | INHALATION_SPRAY | CUTANEOUS | Status: DC | PRN

## 2023-12-02 NOTE — Progress Notes (Signed)
Mobility Specialist Progress Note:   12/02/23 1440  Mobility  Activity Ambulated with assistance in hallway  Level of Assistance Contact guard assist, steadying assist  Assistive Device None  Distance Ambulated (ft) 300 ft  Activity Response Tolerated well  Mobility Referral Yes  Mobility visit 1 Mobility  Mobility Specialist Start Time (ACUTE ONLY) 1440  Mobility Specialist Stop Time (ACUTE ONLY) 1500  Mobility Specialist Time Calculation (min) (ACUTE ONLY) 20 min   Pt agreeable to mobility session. Required only minG assist for safety during ambulation with no AD use. SpO2 95% on RA with ambulation, pt with minor SOB. Back in bed with all needs met.  Addison Lank Mobility Specialist Please contact via SecureChat or  Rehab office at [phone]

## 2023-12-02 NOTE — Progress Notes (Signed)
   12/02/23 1200  Vitals  Pulse Rate 72  Resp 18  BP (!) 142/92  SpO2 100 %  O2 Device Room Air  Oxygen Therapy  Patient Activity (if Appropriate) In bed  Pulse Oximetry Type Continuous  Oximetry Probe Site Changed No  Post Treatment  Dialyzer Clearance Lightly streaked  Liters Processed 63  Fluid Removed (mL) 2000 mL  Tolerated HD Treatment Yes  AVG/AVF Arterial Site Held (minutes) 5 minutes  AVG/AVF Venous Site Held (minutes) 5 minutes   Received patient in bed to unit.  Alert and oriented.  Informed consent signed and in chart.   TX duration: 3 hours  Patient tolerated well.  Transported back to the room  Alert, without acute distress.  Hand-off given to patient's nurse.   Access used: LAVF Access issues: None  Total UF removed: 2000 Medication(s) given: Iron    Mar Daring, LPN  [facility]

## 2023-12-02 NOTE — Progress Notes (Addendum)
Contacted [facility] admissions to request an update on pt's referral. Awaiting a response. Will assist as needed.   Olivia Canter Renal Navigator [phone]  Addendum at 3:42 pm: Pt's referral is still pending with [facility] admissions. Spoke to pt via phone to explain that [facility] and [facility] are full. Pt advised that pt's referral is being reviewed by [facility]. Pt agreeable to this possible option. Will await final determination from clinic.

## 2023-12-02 NOTE — Progress Notes (Signed)
   12/02/23 1245  Assess: if the MEWS score is Yellow or Red  Were vital signs accurate and taken at a resting state? Yes  Does the patient meet 2 or more of the SIRS criteria? No  MEWS guidelines implemented  No, previously yellow, continue vital signs every 4 hours   EKG done and md made aware no new orders. Patient asymptomatic will monitor

## 2023-12-02 NOTE — Progress Notes (Signed)
   12/02/23 0700  Machine Checks  Dialysate Acid Bath Lot Number [id number] (NO 3K 2.5 Ca BATHS AVAILABLE, PT WILL RUN ON 2k 2.5 CA PER DR. COLADONATO)

## 2023-12-02 NOTE — Plan of Care (Signed)
Care plan reviewed.

## 2023-12-02 NOTE — Progress Notes (Addendum)
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  On presentation creatinine was 14.9, BNP was 2733, hemoglobin was 6.5.  FOBT negative.  Chest x-ray with vascular congestion and diffuse interstitial groundglass opacity.  Suspected from pulm overload.  Nephrology consulted, started on serial dialysis.  Pending outpatient dialysis facility arrangement  Assessment & Plan:  Principal Problem:   Acute heart failure with preserved ejection fraction (HFpEF) (HCC) Active Problems:   CKD (chronic kidney disease) stage 5, GFR less than 15 ml/min (HCC)   Essential hypertension   Hyperlipidemia   Hypothyroidism   Gout   COPD (chronic obstructive pulmonary disease) (HCC)   Anxiety and depression   OSA (obstructive sleep apnea)   Anemia due to chronic kidney disease    Dyspnea: Currently on room air.  Chest imaging showed features of volume overload, vascular congestion,lower extremity edema .  Significantly improved now  CKD stage V: Not on dialysis.  Nephrology consulted.  Potassium 5.4, creatinine 14.9, elevated BNP.  Has AV graft on LUE.  He has previously refused hemodialysis.  Now agreed on dialysis .Started  on serial dialysis.  Social worker consulted for arranging outpatient dialysis facility on discharge.CLIP pending.  Continue sodium bicarb  Normocytic anemia: No evidence of acute blood loss.  Fecal occult blood negative.  Iron level optimal.  Likely this is associated CKD.  Given a unit of PRBC.  Currently hemoglobin is stable in the range of 7.  Given IV iron  HFpEF: Last echo showed EF of 55-60% grade 2 diastolic dysfunction.  On torsemide 100 mg daily at home.  Appeared volume overloaded on  presentation but not now.  Echo showed EF of 60 to 65%, grade 2 diastolic dysfunction.  Volume management per dialysis.On lasix iv   COPD: Continue bronchodilators as needed, Dulera resumed.  Currently on room air  Hypertension: Continue Norvasc, labetalol.  Slightly hypertensive  this morning  Gout: Continue allopurinol, added colchicine  Hyperlipidemia: Continue Lipitor  Hypothyroidism: On levothyroxine  OSA: CPAP ordered  Addendum: I was reported that the patient went into brief Afib but now spontaneously converted to NSR.Continue monitoring on tele        DVT prophylaxis:SCDs Start: 11/29/23 0457     Code Status: Full Code  Family Communication: None at the bedside  Patient status: In patient  Patient is from : Home  Anticipated discharge to: Home  Estimated DC date: After nephrology clearance, outpatient dialysis arrangement   Consultants: Nephro  Procedures: Dialysis  Antimicrobials:  Anti-infectives (From admission, onward)    None       Subjective: Patient seen and examined at bedside today.  He was on dialysis yet.  Very comfortable.  Denies any new complaints  Objective: Vitals:   12/02/23 1030 12/02/23 1100 12/02/23 1116 12/02/23 1124  BP: (!) 140/93 (!) 159/97 (!) 145/87 (!) 157/87  Pulse: 72 70 (!) 102 72  Resp: (!) 21 (!) 26 (!) 21 (!) 24  Temp:   98 F (36.7 C)   TempSrc:      SpO2: 100% 100% 100% 100%  Weight:   78.2 kg   Height:        Intake/Output Summary (Last 24 hours) at 12/02/2023 1143 Last data filed  at 12/02/2023 0500 Gross per 24 hour  Intake 297 ml  Output 825 ml  Net -528 ml   Filed Weights   12/02/23 0519 12/02/23 0756 12/02/23 1116  Weight: 79.2 kg 81 kg 78.2 kg    Examination:   General exam: Overall comfortable, not in distress HEENT: PERRL Respiratory system:  no wheezes or crackles  Cardiovascular system: S1 & S2 heard, RRR.  Gastrointestinal system: Abdomen is nondistended, soft and  nontender. Central nervous system: Alert and oriented Extremities: No edema, no clubbing ,no cyanosis, AV fistula  on the left upper extremity Skin: No rashes, no ulcers,no icterus      Data Reviewed: I have personally reviewed following labs and imaging studies  CBC: Recent Labs  Lab 11/28/23 2351 11/29/23 0843 11/30/23 0245 12/02/23 0239  WBC 7.1 8.8 7.0 7.4  NEUTROABS 4.6 7.6  --   --   HGB 6.5* 7.3* 7.3* 8.6*  HCT 19.9* 21.8* 22.0* 26.1*  MCV 70.8* 72.7* 71.2* 72.1*  PLT 141* 135* 135* 158   Basic Metabolic Panel: Recent Labs  Lab 11/28/23 2351 11/29/23 0138 11/30/23 0245 11/30/23 1336 12/01/23 0227 12/02/23 0239  NA 136  --  135  --  136 136  K 5.4*  --  4.1  --  4.2 4.1  CL 102  --  100  --  99 98  CO2 22  --  23  --  26 21*  GLUCOSE 100*  --  85  --  89 131*  BUN 103*  --  69*  --  53* 64*  CREATININE 14.90*  --  10.42*  --  8.09* 9.27*  CALCIUM 7.9*  --  8.1*  --  7.9* 7.8*  MG  --  1.9  --   --  1.7  --   PHOS  --   --   --  4.9*  --   --      No results found for this or any previous visit (from the past 240 hours).   Radiology Studies: DG CHEST PORT 1 VIEW Result Date: 12/01/2023 CLINICAL DATA:  Shortness of breath EXAM: PORTABLE CHEST 1 VIEW COMPARISON:  11/28/2023 FINDINGS: Cardiomegaly. Pulmonary vascular prominence without overt edema. No acute osseous findings. IMPRESSION: Cardiomegaly with pulmonary vascular prominence but without overt edema. No focal airspace opacity. Electronically Signed   By: Jearld Lesch M.D.   On: 12/01/2023 10:17    Scheduled Meds:  allopurinol  100 mg Oral Daily   amLODipine  10 mg Oral Daily   atorvastatin  40 mg Oral Daily   Chlorhexidine Gluconate Cloth  6 each Topical Q0600   Chlorhexidine Gluconate Cloth  6 each Topical Q0600   furosemide  80 mg Intravenous BID   guaiFENesin  600 mg Oral BID   labetalol  300 mg Oral BID   levothyroxine  100 mcg Oral Daily   mometasone-formoterol  2 puff Inhalation BID    sodium bicarbonate  1,300 mg Oral BID   triamcinolone cream   Topical BID   Continuous Infusions:  anticoagulant sodium citrate     iron sucrose 100 mg (12/02/23 0947)     LOS: 3 days   Burnadette Pop, MD [facility] P1/27/2025, 11:43 AM

## 2023-12-02 NOTE — Procedures (Signed)
I was present at this dialysis session. I have reviewed the session itself and made appropriate changes.   Vital signs in last 24 hours:  Temp:  [97.6 F (36.4 C)-98.7 F (37.1 C)] 98.7 F (37.1 C) (01/27 0754) Pulse Rate:  [74-83] 80 (01/27 0805) Resp:  [18-33] 33 (01/27 0805) BP: (118-157)/(75-92) 157/92 (01/27 0805) SpO2:  [96 %-100 %] 100 % (01/27 0805) Weight:  [79.2 kg-81 kg] 81 kg (01/27 0756) Weight change: -0.347 kg Filed Weights   12/01/23 0424 12/02/23 0519 12/02/23 0756  Weight: 78.2 kg 79.2 kg 81 kg    Recent Labs  Lab 11/30/23 1336 12/01/23 0227 12/02/23 0239  NA  --    < > 136  K  --    < > 4.1  CL  --    < > 98  CO2  --    < > 21*  GLUCOSE  --    < > 131*  BUN  --    < > 64*  CREATININE  --    < > 9.27*  CALCIUM  --    < > 7.8*  PHOS 4.9*  --   --    < > = values in this interval not displayed.    Recent Labs  Lab 11/28/23 2351 11/29/23 0843 11/30/23 0245 12/02/23 0239  WBC 7.1 8.8 7.0 7.4  NEUTROABS 4.6 7.6  --   --   HGB 6.5* 7.3* 7.3* 8.6*  HCT 19.9* 21.8* 22.0* 26.1*  MCV 70.8* 72.7* 71.2* 72.1*  PLT 141* 135* 135* 158    Scheduled Meds:  allopurinol  100 mg Oral Daily   amLODipine  10 mg Oral Daily   atorvastatin  40 mg Oral Daily   Chlorhexidine Gluconate Cloth  6 each Topical Q0600   Chlorhexidine Gluconate Cloth  6 each Topical Q0600   furosemide  80 mg Intravenous BID   guaiFENesin  600 mg Oral BID   labetalol  300 mg Oral BID   levothyroxine  100 mcg Oral Daily   mometasone-formoterol  2 puff Inhalation BID   sodium bicarbonate  1,300 mg Oral BID   triamcinolone cream   Topical BID   Continuous Infusions:  anticoagulant sodium citrate     iron sucrose     PRN Meds:.acetaminophen **OR** acetaminophen, alteplase, anticoagulant sodium citrate, benzonatate, lidocaine (PF), lidocaine-prilocaine, pentafluoroprop-tetrafluoroeth   Irena Cords,  MD 12/02/2023, 8:26 AM

## 2023-12-03 DIAGNOSIS — I5031 Acute diastolic (congestive) heart failure: Secondary | ICD-10-CM | POA: Diagnosis not present

## 2023-12-03 LAB — BASIC METABOLIC PANEL
Anion gap: 12 (ref 5–15)
BUN: 45 mg/dL — ABNORMAL HIGH (ref 6–20)
CO2: 28 mmol/L (ref 22–32)
Calcium: 7.6 mg/dL — ABNORMAL LOW (ref 8.9–10.3)
Chloride: 98 mmol/L (ref 98–111)
Creatinine, Ser: 7.01 mg/dL — ABNORMAL HIGH (ref 0.61–1.24)
GFR, Estimated: 8 mL/min — ABNORMAL LOW (ref >60)
Glucose, Bld: 121 mg/dL — ABNORMAL HIGH (ref 70–99)
Potassium: 3.6 mmol/L (ref 3.5–5.1)
Sodium: 138 mmol/L (ref 135–145)

## 2023-12-03 MED ORDER — RENA-VITE PO TABS: 1.0000 | ORAL_TABLET | Freq: Every day | ORAL | Status: DC

## 2023-12-03 MED ORDER — DARBEPOETIN ALFA 100 MCG/0.5ML IJ SOSY
100.0000 ug | PREFILLED_SYRINGE | INTRAMUSCULAR | Status: DC
  Filled 2023-12-03: qty 0.5

## 2023-12-03 NOTE — Plan of Care (Signed)
Problem: Education: Goal: Knowledge of General Education information will improve Description: Including pain rating scale, medication(s)/side effects and non-pharmacologic comfort measures Outcome: Adequate for Discharge   Problem: Health Behavior/Discharge Planning: Goal: Ability to manage health-related needs will improve Outcome: Adequate for Discharge   Problem: Clinical Measurements: Goal: Ability to maintain clinical measurements within normal limits will improve Outcome: Adequate for Discharge Goal: Will remain free from infection Outcome: Adequate for Discharge Goal: Diagnostic test results will improve Outcome: Adequate for Discharge Goal: Respiratory complications will improve Outcome: Adequate for Discharge Goal: Cardiovascular complication will be avoided Outcome: Adequate for Discharge   Problem: Activity: Goal: Risk for activity intolerance will decrease Outcome: Adequate for Discharge   Problem: Nutrition: Goal: Adequate nutrition will be maintained Outcome: Adequate for Discharge   Problem: Coping: Goal: Level of anxiety will decrease Outcome: Adequate for Discharge   Problem: Elimination: Goal: Will not experience complications related to bowel motility Outcome: Adequate for Discharge Goal: Will not experience complications related to urinary retention Outcome: Adequate for Discharge   Problem: Pain Managment: Goal: General experience of comfort will improve and/or be controlled Outcome: Adequate for Discharge   Problem: Safety: Goal: Ability to remain free from injury will improve Outcome: Adequate for Discharge   Problem: Skin Integrity: Goal: Risk for impaired skin integrity will decrease Outcome: Adequate for Discharge   Problem: Education: Goal: Knowledge of disease and its progression will improve Outcome: Adequate for Discharge Goal: Individualized Educational Video(s) Outcome: Adequate for Discharge   Problem: Fluid Volume: Goal:  Compliance with measures to maintain balanced fluid volume will improve Outcome: Adequate for Discharge   Problem: Health Behavior/Discharge Planning: Goal: Ability to manage health-related needs will improve Outcome: Adequate for Discharge   Problem: Nutritional: Goal: Ability to make healthy dietary choices will improve Outcome: Adequate for Discharge   Problem: Clinical Measurements: Goal: Complications related to the disease process, condition or treatment will be avoided or minimized Outcome: Adequate for Discharge   Problem: Increased Nutrient Needs (NI-5.1) Goal: Food and/or nutrient delivery Description: Individualized approach for food/nutrient provision. Outcome: Adequate for Discharge   Problem: Education: Goal: Knowledge of General Education information will improve Description: Including pain rating scale, medication(s)/side effects and non-pharmacologic comfort measures Outcome: Adequate for Discharge   Problem: Health Behavior/Discharge Planning: Goal: Ability to manage health-related needs will improve Outcome: Adequate for Discharge   Problem: Clinical Measurements: Goal: Ability to maintain clinical measurements within normal limits will improve Outcome: Adequate for Discharge Goal: Will remain free from infection Outcome: Adequate for Discharge Goal: Diagnostic test results will improve Outcome: Adequate for Discharge Goal: Respiratory complications will improve Outcome: Adequate for Discharge Goal: Cardiovascular complication will be avoided Outcome: Adequate for Discharge   Problem: Activity: Goal: Risk for activity intolerance will decrease Outcome: Adequate for Discharge   Problem: Nutrition: Goal: Adequate nutrition will be maintained Outcome: Adequate for Discharge   Problem: Coping: Goal: Level of anxiety will decrease Outcome: Adequate for Discharge   Problem: Elimination: Goal: Will not experience complications related to bowel  motility Outcome: Adequate for Discharge Goal: Will not experience complications related to urinary retention Outcome: Adequate for Discharge   Problem: Pain Managment: Goal: General experience of comfort will improve and/or be controlled Outcome: Adequate for Discharge   Problem: Safety: Goal: Ability to remain free from injury will improve Outcome: Adequate for Discharge   Problem: Skin Integrity: Goal: Risk for impaired skin integrity will decrease Outcome: Adequate for Discharge   Problem: Education: Goal: Knowledge of disease and its progression will improve Outcome:  Adequate for Discharge Goal: Individualized Educational Video(s) Outcome: Adequate for Discharge   Problem: Fluid Volume: Goal: Compliance with measures to maintain balanced fluid volume will improve Outcome: Adequate for Discharge   Problem: Health Behavior/Discharge Planning: Goal: Ability to manage health-related needs will improve Outcome: Adequate for Discharge   Problem: Nutritional: Goal: Ability to make healthy dietary choices will improve Outcome: Adequate for Discharge   Problem: Clinical Measurements: Goal: Complications related to the disease process, condition or treatment will be avoided or minimized Outcome: Adequate for Discharge   Problem: Increased Nutrient Needs (NI-5.1) Goal: Food and/or nutrient delivery Description: Individualized approach for food/nutrient provision. Outcome: Adequate for Discharge

## 2023-12-03 NOTE — Progress Notes (Signed)
12/03/2023  Johnny [name] DOB: [birth year] MRN: [id number]   RIDER WAIVER AND RELEASE OF LIABILITY  For the purposes of helping with transportation needs, [facility] partners with outside transportation providers (taxi companies, [place], Catering manager.) to give [employer] patients or other approved people the choice of on-demand rides [employer]") to our buildings for non-emergency visits.  By using [employer], I, the person signing this document, on behalf of myself and/or any legal minors (in my care using the [employer]), agree:  Science writer given to me are supplied by independent, outside transportation providers who do not work for, or have any affiliation with, [employer]. [place] is not a transportation company. [facility] has no control over the quality or safety of the rides I get using [employer]. [facility] has no control over whether any outside ride will happen on time or not. [facility] gives no guarantee on the reliability, quality, safety, or availability on any rides, or that no mistakes will happen. I know and accept that traveling by vehicle (car, truck, SVU, Zenaida Niece, bus, taxi, etc.) has risks of serious injuries such as disability, being paralyzed, and death. I know and agree the risk of using [employer] is mine alone, and not [employer]. Transport Services are provided "as is" and as are available. The transportation providers are in charge for all inspections and care of the vehicles used to provide these rides. I agree not to take legal action against [facility], its agents, employees, officers, directors, representatives, insurers, attorneys, assigns, successors, subsidiaries, and affiliates at any time for any reasons related directly or indirectly to using [employer]. I also agree not to take legal action against [facility] or its affiliates for any injury, death, or damage to property caused by or related to using  [employer]. I have read this Waiver and Release of Liability, and I understand the terms used in it and their legal meaning. This Waiver is freely and voluntarily given with the understanding that my right (or any legal minors) to legal action against  relating to [employer] is knowingly given up to use these services.   I attest that I read the Ride Waiver and Release of Liability to Johnny [name], gave Mr. Epps the opportunity to ask questions and answered the questions asked (if any). I affirm that Johnny [name] then provided consent for assistance with transportation.

## 2023-12-03 NOTE — Progress Notes (Signed)
Mobility Specialist Progress Note:   12/03/23 1130  Mobility  Level of Assistance Standby assist, set-up cues, supervision of patient - no hands on  Assistive Device None  Distance Ambulated (ft) 350 ft  Activity Response Tolerated well  Mobility Referral Yes  Mobility visit 1 Mobility  Mobility Specialist Start Time (ACUTE ONLY) 1130  Mobility Specialist Stop Time (ACUTE ONLY) 1140  Mobility Specialist Time Calculation (min) (ACUTE ONLY) 10 min   Pt agreeable to mobility session. Required no physical assistance throughout ambulation. VSS on RA, however pt with audible SOB. Encouraged frequent ambulation, pt back in bed with all needs met.   Addison Lank Mobility Specialist Please contact via SecureChat or  Rehab office at [phone]

## 2023-12-03 NOTE — TOC Transition Note (Signed)
Transition of Care [facility]) - Discharge Note   Patient Details  Name: Johnny [name] MRN: [id number] Date of Birth: April 28, 1961  Transition of Care [facility]) CM/SW Contact:  Leone Haven, RN Phone Number: 12/03/2023, 2:23 PM   Clinical Narrative:    For dc home, , NCM assisted with cab voucher and PCS form.   Final next level of care: Home/Self Care Barriers to Discharge: No Barriers Identified   Patient Goals and CMS Choice Patient states their goals for this hospitalization and ongoing recovery are:: return home   Choice offered to / list presented to : NA      Discharge Placement                       Discharge Plan and Services Additional resources added to the After Visit Summary for   In-house Referral: NA Discharge Planning Services: CM Consult Post Acute Care Choice: NA          DME Arranged: N/A DME Agency: NA       HH Arranged: NA          Social Drivers of Health (SDOH) Interventions SDOH Screenings   Food Insecurity: No Food Insecurity (11/29/2023)  Housing: Low Risk  (11/29/2023)  Transportation Needs: Unmet Transportation Needs (11/29/2023)  Utilities: Not At Risk (11/29/2023)  Alcohol Screen: Low Risk  (11/20/2023)  Depression (PHQ2-9): Low Risk  (02/06/2023)  Financial Resource Strain: Low Risk  (11/20/2023)  Social Connections: Moderately Integrated (11/20/2023)  Stress: Stress Concern Present (11/20/2023)  Tobacco Use: Medium Risk (11/28/2023)  Health Literacy: Adequate Health Literacy (11/20/2023)     Readmission Risk Interventions    09/25/2022    1:43 PM  Readmission Risk Prevention Plan  Transportation Screening Complete  Medication Review (RN Care Manager) Complete  PCP or Specialist appointment within 3-5 days of discharge Complete  HRI or Home Care Consult Complete  SW Recovery Care/Counseling Consult --  Palliative Care Screening Not Applicable  Skilled Nursing Facility Not Applicable

## 2023-12-03 NOTE — Progress Notes (Signed)
Explained discharge instructions to patient. Reviewed follow up appointment and next medication administration times. Also reviewed education. Patient verbalized having an understanding for instructions given. All belongings are in the patient's possession. IV and telemetry were removed. CCMD was notified. No other needs verbalized. Transporting downstairs to the discharge lounge to await his taxi.

## 2023-12-03 NOTE — TOC Initial Note (Signed)
Transition of Care [facility]) - Initial/Assessment Note    Patient Details  Name: Johnny [name] MRN: [id number] Date of Birth: 09-16-1961  Transition of Care [facility]) CM/SW Contact:    Leone Haven, RN Phone Number: 12/03/2023, 2:21 PM  Clinical Narrative:                 From home alone, indep, has PCP , [facility] and insurance on file, states has no HH services in place at this time, has neb machine at home.  States will need cab voucher at dc,and friend  is support system, states gets medications from [facility].  Pta self ambulatory. His friend states they would like to have PCS form to take to PCP office for [facility] services.     Expected Discharge Plan: Home/Self Care Barriers to Discharge: No Barriers Identified   Patient Goals and CMS Choice Patient states their goals for this hospitalization and ongoing recovery are:: return home   Choice offered to / list presented to : NA      Expected Discharge Plan and Services In-house Referral: NA Discharge Planning Services: CM Consult Post Acute Care Choice: NA Living arrangements for the past 2 months: Single Family Home Expected Discharge Date: 12/03/23               DME Arranged: N/A DME Agency: NA       HH Arranged: NA          Prior Living Arrangements/Services Living arrangements for the past 2 months: Single Family Home Lives with:: Self Patient language and need for interpreter reviewed:: Yes Do you feel safe going back to the place where you live?: Yes      Need for Family Participation in Patient Care: No (Comment) Care giver support system in place?: Yes (comment) Current home services: DME (neb machine) Criminal Activity/Legal Involvement Pertinent to Current Situation/Hospitalization: No - Comment as needed  Activities of Daily Living   ADL Screening (condition at time of admission) Independently performs ADLs?: Yes (appropriate for developmental age) Is the patient deaf or have difficulty hearing?:  No Does the patient have difficulty seeing, even when wearing glasses/contacts?: No Does the patient have difficulty concentrating, remembering, or making decisions?: No  Permission Sought/Granted Permission sought to share information with : Case Manager Permission granted to share information with : Yes, Verbal Permission Granted              Emotional Assessment Appearance:: Appears stated age Attitude/Demeanor/Rapport: Engaged Affect (typically observed): Appropriate Orientation: : Oriented to Self, Oriented to Place, Oriented to  Time, Oriented to Situation   Psych Involvement: No (comment)  Admission diagnosis:  Acute on chronic diastolic CHF (congestive heart failure) (HCC) [I50.33] Symptomatic anemia [D64.9] Acute heart failure with preserved ejection fraction (HFpEF) (HCC) [I50.31] Patient Active Problem List   Diagnosis Date Noted   Acute heart failure with preserved ejection fraction (HFpEF) (HCC) 11/29/2023   Anemia due to chronic kidney disease 11/29/2023   Fluid overload 09/20/2022   Group A streptococcal infection 06/05/2022   COPD exacerbation (HCC) 05/23/2022   AKI (acute kidney injury) (HCC) 11/29/2021   Toenail fungus 06/11/2019   Homelessness 06/11/2019   Other atopic dermatitis 06/11/2019   Hypothyroidism 06/11/2019   Eczema of hand 11/10/2018   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 ([place]), [place] - [address] [place] ([place])  [place] [address] Phone: [phone] Fax: [phone]     Social Drivers of Health (SDOH) Social History: SDOH Screenings   Food Insecurity: No Food Insecurity (11/29/2023)  Housing: Low Risk  (11/29/2023)  Transportation Needs: Unmet Transportation Needs (11/29/2023)  Utilities: Not At Risk (11/29/2023)  Alcohol Screen: Low Risk  (11/20/2023)  Depression (PHQ2-9): Low Risk  (02/06/2023)  Financial Resource Strain: Low Risk  (11/20/2023)  Social Connections: Moderately Integrated (11/20/2023)  Stress: Stress Concern Present (11/20/2023)  Tobacco Use: Medium Risk (11/28/2023)  Health Literacy: Adequate Health Literacy (11/20/2023)   SDOH Interventions: Transportation Interventions: Inpatient TOC, Other (Comment) (Pt already has transportation resources) Stress Interventions: Inpatient TOC   Readmission Risk Interventions    09/25/2022    1:43 PM  Readmission Risk Prevention Plan  Transportation Screening Complete  Medication Review (RN Care Manager) Complete  PCP or Specialist appointment within 3-5 days of discharge Complete  HRI or Home Care Consult Complete  SW Recovery Care/Counseling Consult --  Palliative Care Screening Not Applicable  Skilled Nursing Facility Not Applicable

## 2023-12-03 NOTE — Discharge Summary (Signed)
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 On presentation creatinine was 14.9, BNP was 2733, hemoglobin was 6.5. FOBT negative. Chest x-ray with vascular congestion and diffuse interstitial groundglass opacity. Suspected from pulm overload. Nephrology consulted, started on serial dialysis. Completed  outpatient dialysis facility arrangement , being discharged today.  He will start dialysis as an outpatient on TTS schedule.  Following problems were addressed during the hospitalization:  Dyspnea: Currently on room air.  Chest imaging showed features of volume overload, vascular congestion,lower extremity edema .  Significantly improved now   CKD stage V now ESRD: Not on dialysis.  Nephrology consulted. Has AV graft on LUE.  He has previously refused hemodialysis.  Now agreed on dialysis .Started  on serial dialysis.  Social worker consulted for arranging outpatient dialysis facility on discharge.CLIP completed.  Continue sodium bicarb   Normocytic anemia: No evidence of acute blood loss.  Fecal occult blood negative.  Iron level optimal.  Likely this is associated CKD.  Given a unit of PRBC.  Currently hemoglobin is stable in the range of 7-8.  Given IV iron   HFpEF: Last echo showed EF of 55-60% grade 2 diastolic dysfunction.  On torsemide 100 mg daily at home.  Appeared volume overloaded on presentation but not now.  Echo showed EF of 60 to 65%, grade 2 diastolic dysfunction.  Volume management per dialysis.    COPD: Continue bronchodilators as needed, Dulera resumed.  Currently on room air   Hypertension: Continue labetalol.     Gout: Continue allopurinol, as needed colchicine   Hyperlipidemia: Continue Lipitor   Hypothyroidism: On levothyroxine   OSA: CPAP to be continued    Discharge Diagnoses:  Principal Problem:   Acute heart failure with preserved ejection fraction (HFpEF) (HCC) Active Problems:   CKD (chronic kidney disease) stage 5, GFR less than 15 ml/min (HCC)   Essential hypertension   Hyperlipidemia   Hypothyroidism   Gout   COPD (chronic obstructive pulmonary disease) (HCC)   Anxiety and depression   OSA (obstructive sleep apnea)   Anemia due to chronic kidney disease    Discharge Instructions  Discharge Instructions     Diet - low sodium heart healthy   Complete by: As directed    Discharge instructions   Complete by: As directed    1)Take your medications as instructed 2)Continue outpatient dialysis 3)Follow up with your PCP and nephrologist   Increase activity slowly   Complete by: As directed       Allergies as of 12/03/2023       Reactions   Heparin    Pt reports he's not sure what type of reaction   Naproxen Other (See Comments)   Unknown reaction    Shellfish Allergy Itching, Swelling        Medication List     STOP taking these medications    amLODipine 10 MG tablet Commonly known as: NORVASC       TAKE these medications    allopurinol 100 MG tablet Commonly known as: ZYLOPRIM Take 1 tablet (100 mg total) by mouth daily.  atorvastatin 40 MG tablet Commonly known as: LIPITOR Take 1 tablet (40 mg total) by mouth daily.   colchicine 0.6 MG tablet TAKE 2 TABLETS BY MOUTH AT THE ONSET OF A GOUT FLARE. MAY REPEAT 1 TABLET IN 1 HOUR IF SYMPTOMS PERSIST   Dulera 100-5 MCG/ACT Aero Generic drug: mometasone-formoterol Inhale 2 puffs into the lungs 2 (two) times daily.   ipratropium-albuterol 0.5-2.5 (3) MG/3ML Soln Commonly  known as: DUONEB Inhale 3 mLs into the lungs every 6 (six) hours as needed.   labetalol 300 MG tablet Commonly known as: NORMODYNE Take 1 tablet by mouth twice daily   levothyroxine 100 MCG tablet Commonly known as: SYNTHROID Take 100 mcg by mouth daily.   mupirocin ointment 2 % Commonly known as: BACTROBAN Apply 1 Application topically 2 (two) times daily.   sodium bicarbonate 650 MG tablet Take 1,300 mg by mouth 2 (two) times daily.   torsemide 100 MG tablet Commonly known as: DEMADEX Take 1 tablet (100 mg total) by mouth daily.   triamcinolone cream 0.1 % Commonly known as: KENALOG APPLY CREAM EXTERNALLY TWICE DAILY   Ventolin HFA 108 (90 Base) MCG/ACT inhaler Generic drug: albuterol Inhale 2 puffs into the lungs every 6 (six) hours as needed for wheezing or shortness of breath.        Follow-up Information     Center, [facility]. Go on 12/05/2023.   Why: Schedule is Tuesday, Thursday, Saturday with 6:00 am chair time.  Please arrive at 5:45 am for HD appointments.  Please go to clinic on Wednesday, Jan 29 to complete paperwork prior to first treatment on Thursday. Please come 8:30 am-1:30 pm or 2:30 pm- 4:30 pm. Contact information: [address] [place] [address] [phone]         Hoy Register, MD. Schedule an appointment as soon as possible for a visit in 1 week(s).   Specialty: Family Medicine Contact information: [address] [place] [address] [place] [address] [phone]                Allergies  Allergen Reactions   Heparin     Pt reports he's not sure what type of reaction   Naproxen Other (See Comments)    Unknown reaction    Shellfish Allergy Itching and Swelling    Consultations: Nephrology   Procedures/Studies: DG CHEST PORT 1 VIEW Result Date: 12/01/2023 CLINICAL DATA:  Shortness of breath EXAM: PORTABLE CHEST 1 VIEW COMPARISON:  11/28/2023 FINDINGS: Cardiomegaly. Pulmonary vascular prominence  without overt edema. No acute osseous findings. IMPRESSION: Cardiomegaly with pulmonary vascular prominence but without overt edema. No focal airspace opacity. Electronically Signed   By: Jearld Lesch M.D.   On: 12/01/2023 10:17   ECHOCARDIOGRAM COMPLETE Result Date: 11/29/2023    ECHOCARDIOGRAM REPORT   Patient Name:   Sueo DEONTEZ KLINKE Date of Exam: 11/29/2023 Medical Rec #:  [id number]     Height:       72.0 in Accession #:    [id number]    Weight:       180.0 lb Date of Birth:  [birth year]     BSA:          2.037 m Patient Age:    62 years      BP:           141/97 mmHg Patient Gender: M             HR:           80 bpm. Exam  Location:  Inpatient Procedure: 2D Echo, Cardiac Doppler and Color Doppler Indications:    CHF-Acute Diastolic I50.31  History:        Patient has prior history of Echocardiogram examinations, most                 recent 09/21/2022. CHF, COPD; Risk Factors:Hypertension,                 Diabetes and Sleep Apnea.  Sonographer:    Darlys Gales Referring Phys: [phone] DEBBY CROSLEY IMPRESSIONS  1. Left ventricular ejection fraction, by estimation, is 60 to 65%. The left ventricle has normal function. The left ventricle has no regional wall motion abnormalities. The left ventricular internal cavity size was mildly dilated. Left ventricular diastolic parameters are consistent with Grade II diastolic dysfunction (pseudonormalization).  2. Right ventricular systolic function is normal. The right ventricular size is normal. There is normal pulmonary artery systolic pressure.  3. Left atrial size was severely dilated.  4. Right atrial size was moderately dilated.  5. The mitral valve is normal in structure. Mild mitral valve regurgitation. No evidence of mitral stenosis.  6. The aortic valve is tricuspid. Aortic valve regurgitation is not visualized. No aortic stenosis is present.  7. The inferior vena cava is normal in size with greater than 50% respiratory variability, suggesting right atrial pressure of  3 mmHg. FINDINGS  Left Ventricle: Left ventricular ejection fraction, by estimation, is 60 to 65%. The left ventricle has normal function. The left ventricle has no regional wall motion abnormalities. The left ventricular internal cavity size was mildly dilated. There is  no left ventricular hypertrophy. Left ventricular diastolic parameters are consistent with Grade II diastolic dysfunction (pseudonormalization). Right Ventricle: The right ventricular size is normal. No increase in right ventricular wall thickness. Right ventricular systolic function is normal. There is normal pulmonary artery systolic pressure. The tricuspid regurgitant velocity is 1.02 m/s, and  with an assumed right atrial pressure of 15 mmHg, the estimated right ventricular systolic pressure is 19.2 mmHg. Left Atrium: Left atrial size was severely dilated. Right Atrium: Right atrial size was moderately dilated. Pericardium: There is no evidence of pericardial effusion. Mitral Valve: The mitral valve is normal in structure. Mild mitral valve regurgitation. No evidence of mitral valve stenosis. Tricuspid Valve: The tricuspid valve is normal in structure. Tricuspid valve regurgitation is mild . No evidence of tricuspid stenosis. Aortic Valve: The aortic valve is tricuspid. Aortic valve regurgitation is not visualized. No aortic stenosis is present. Aortic valve peak gradient measures 9.0 mmHg. Pulmonic Valve: The pulmonic valve was normal in structure. Pulmonic valve regurgitation is not visualized. No evidence of pulmonic stenosis. Aorta: The aortic root is normal in size and structure. Venous: The inferior vena cava is normal in size with greater than 50% respiratory variability, suggesting right atrial pressure of 3 mmHg. IAS/Shunts: No atrial level shunt detected by color flow Doppler.  LEFT VENTRICLE PLAX 2D LVIDd:         6.10 cm   Diastology LV PW:         2.20 cm   LV e' medial:    7.51 cm/s LV IVS:        0.60 cm   LV E/e' medial:  13.4  LVOT diam:     2.00 cm   LV e' lateral:   8.49 cm/s LV SV:         98        LV E/e' lateral: 11.9 LV SV Index:  48 LVOT Area:     3.14 cm  RIGHT VENTRICLE             IVC RV S prime:     11.60 cm/s  IVC diam: 2.10 cm TAPSE (M-mode): 2.5 cm LEFT ATRIUM             Index        RIGHT ATRIUM           Index LA Vol (A2C):   99.7 ml 48.94 ml/m  RA Area:     23.10 cm LA Vol (A4C):   97.1 ml 47.66 ml/m  RA Volume:   70.50 ml  34.61 ml/m LA Biplane Vol: 99.0 ml 48.59 ml/m  AORTIC VALVE AV Area (Vmax): 3.14 cm AV Vmax:        150.00 cm/s AV Peak Grad:   9.0 mmHg LVOT Vmax:      150.00 cm/s LVOT Vmean:     107.000 cm/s LVOT VTI:       0.311 m MITRAL VALVE                TRICUSPID VALVE MV Area (PHT): 3.89 cm     TR Peak grad:   4.2 mmHg MV Decel Time: 195 msec     TR Vmax:        102.00 cm/s MV E velocity: 101.00 cm/s MV A velocity: 61.80 cm/s   SHUNTS MV E/A ratio:  1.63         Systemic VTI:  0.31 m                             Systemic Diam: 2.00 cm Chilton Si MD Electronically signed by Chilton Si MD Signature Date/Time: 11/29/2023/4:33:39 PM    Final    DG Chest 2 View Result Date: 11/29/2023 CLINICAL DATA:  Shortness of breath EXAM: CHEST - 2 VIEW COMPARISON:  09/20/2022 FINDINGS: Cardiomegaly with vascular congestion and diffuse interstitial and ground-glass opacity favored to represent pulmonary edema. Small bilateral effusions. No pneumothorax. Globular cardiac configuration. IMPRESSION: Cardiomegaly with vascular congestion and diffuse interstitial and ground-glass opacity favored to represent pulmonary edema. Small bilateral effusions. Globular cardiac configuration, cannot exclude pericardial effusion Electronically Signed   By: Jasmine Pang M.D.   On: 11/29/2023 00:15   PERIPHERAL VASCULAR CATHETERIZATION Result Date: 11/08/2023 DATE OF SERVICE: 11/08/2023  PATIENT:  Ignacia Palma  63 y.o. male  PRE-OPERATIVE DIAGNOSIS:  CKD 5  POST-OPERATIVE DIAGNOSIS:  Same  PROCEDURE:  Left arm  fistulagram  SURGEON:  Zetta Bills, MD  ASSISTANT: Rande Brunt. Lenell Antu, MD  ANESTHESIA:   local  ESTIMATED BLOOD LOSS: minimal  LOCAL MEDICATIONS USED:  LIDOCAINE  COUNTS: confirmed correct.  PATIENT DISPOSITION:  PACU - hemodynamically stable.  Delay start of Pharmacological VTE agent (>24hrs) due to surgical blood loss or risk of bleeding: no  INDICATION FOR PROCEDURE: AARSH FRISTOE is a 62 y.o. male with CKD V. He had a fistula created many years ago. After careful discussion of risks, benefits, and alternatives the patient was offered fistulogram to evaluate if the fistula is ready to use.  The patient understood and wished to proceed.  OPERATIVE FINDINGS: widely patent fistula; no central venous stenosis; no stenosis of fistula in the shoulder. Large sidebranch in the arm that contributes to drainage.  DESCRIPTION OF PROCEDURE: After identification of the patient in the pre-operative holding area, the patient was transferred to the operating room. The patient was  positioned supine on the operating room table. The groins was prepped and draped in standard fashion. A surgical pause was performed confirming correct patient, procedure, and operative location.  Micropuncture access was obtained in the left arm fistula. Micro sheath was placed using seldinger technique. Fistulagram was performed in stations. See above for details. After adequate imaging was obtained the sheath was removed and a stitch placed about the access with good hemostasis.  Upon completion of the case instrument and sharps counts were confirmed correct. The patient was transferred to the PACU in good condition. I was present for all portions of the procedure.  Rande Brunt. Lenell Antu, MD [facility] Phone Number: [phone] 11/08/2023 1:22 PM      Subjective: Patient seen and examined at bedside today.  Very comfortable.  Lying in bed.  Denies new complaints.  Medically stable for discharge to home  today  Discharge Exam: Vitals:   12/03/23 0807 12/03/23 1148  BP:  (!) 129/90  Pulse: 80 80  Resp: 19 18  Temp:  98.1 F (36.7 C)  SpO2: 100% 99%   Vitals:   12/03/23 0430 12/03/23 0722 12/03/23 0807 12/03/23 1148  BP: (!) 139/94 137/85  (!) 129/90  Pulse: 82 83 80 80  Resp:  18 19 18   Temp: 98.7 F (37.1 C) 98.4 F (36.9 C)  98.1 F (36.7 C)  TempSrc: Oral Oral  Oral  SpO2: 100% 93% 100% 99%  Weight: 83.1 kg     Height:        General: Pt is alert, awake, not in acute distress Cardiovascular: RRR, S1/S2 +, no rubs, no gallops Respiratory: CTA bilaterally, no wheezing, no rhonchi Abdominal: Soft, NT, ND, bowel sounds + Extremities: no edema, no cyanosis, AV fistula on the left upper extremity    The results of significant diagnostics from this hospitalization (including imaging, microbiology, ancillary and laboratory) are listed below for reference.     Microbiology: No results found for this or any previous visit (from the past 240 hours).   Labs: BNP (last 3 results) Recent Labs    11/28/23 2351  BNP 2,733.3*   Basic Metabolic Panel: Recent Labs  Lab 11/28/23 2351 11/29/23 0138 11/30/23 0245 11/30/23 1336 12/01/23 0227 12/02/23 0239 12/03/23 0232  NA 136  --  135  --  136 136 138  K 5.4*  --  4.1  --  4.2 4.1 3.6  CL 102  --  100  --  99 98 98  CO2 22  --  23  --  26 21* 28  GLUCOSE 100*  --  85  --  89 131* 121*  BUN 103*  --  69*  --  53* 64* 45*  CREATININE 14.90*  --  10.42*  --  8.09* 9.27* 7.01*  CALCIUM 7.9*  --  8.1*  --  7.9* 7.8* 7.6*  MG  --  1.9  --   --  1.7  --   --   PHOS  --   --   --  4.9*  --   --   --    Liver Function Tests: Recent Labs  Lab 11/28/23 2351  AST 24  ALT 18  ALKPHOS 59  BILITOT 0.9  PROT 7.2  ALBUMIN 3.3*   No results for input(s): "LIPASE", "AMYLASE" in the last 168 hours. No results for input(s): "AMMONIA" in the last 168 hours. CBC: Recent Labs  Lab 11/28/23 2351 11/29/23 0843 11/30/23 0245  12/02/23 0239  WBC 7.1 8.8 7.0 7.4  NEUTROABS 4.6 7.6  --   --   HGB 6.5* 7.3* 7.3* 8.6*  HCT 19.9* 21.8* 22.0* 26.1*  MCV 70.8* 72.7* 71.2* 72.1*  PLT 141* 135* 135* 158   Cardiac Enzymes: No results for input(s): "CKTOTAL", "CKMB", "CKMBINDEX", "TROPONINI" in the last 168 hours. BNP: Invalid input(s): "POCBNP" CBG: No results for input(s): "GLUCAP" in the last 168 hours. D-Dimer No results for input(s): "DDIMER" in the last 72 hours. Hgb A1c No results for input(s): "HGBA1C" in the last 72 hours. Lipid Profile No results for input(s): "CHOL", "HDL", "LDLCALC", "TRIG", "CHOLHDL", "LDLDIRECT" in the last 72 hours. Thyroid function studies No results for input(s): "TSH", "T4TOTAL", "T3FREE", "THYROIDAB" in the last 72 hours.  Invalid input(s): "FREET3" Anemia work up No results for input(s): "VITAMINB12", "FOLATE", "FERRITIN", "TIBC", "IRON", "RETICCTPCT" in the last 72 hours. Urinalysis    Component Value Date/Time   COLORURINE YELLOW 11/29/2021 0501   APPEARANCEUR CLEAR 11/29/2021 0501   LABSPEC 1.012 11/29/2021 0501   PHURINE 5.0 11/29/2021 0501   GLUCOSEU 50 (A) 11/29/2021 0501   HGBUR SMALL (A) 11/29/2021 0501   BILIRUBINUR NEGATIVE 11/29/2021 0501   BILIRUBINUR negative 04/10/2019 1634   KETONESUR NEGATIVE 11/29/2021 0501   PROTEINUR 100 (A) 11/29/2021 0501   UROBILINOGEN 1.0 04/10/2019 1634   UROBILINOGEN 4.0 (H) 06/02/2018 2054   NITRITE NEGATIVE 11/29/2021 0501   LEUKOCYTESUR NEGATIVE 11/29/2021 0501   Sepsis Labs Recent Labs  Lab 11/28/23 2351 11/29/23 0843 11/30/23 0245 12/02/23 0239  WBC 7.1 8.8 7.0 7.4   Microbiology No results found for this or any previous visit (from the past 240 hours).  Please note: You were cared for by a hospitalist during your hospital stay. Once you are discharged, your primary care physician will handle any further medical issues. Please note that NO REFILLS for any discharge medications will be authorized once you are  discharged, as it is imperative that you return to your primary care physician (or establish a relationship with a primary care physician if you do not have one) for your post hospital discharge needs so that they can reassess your need for medications and monitor your lab values.    Time coordinating discharge: 40 minutes  SIGNED:   Burnadette Pop, MD  [facility] 12/03/2023, 1:54 PM Pager [phone]  If 7PM-7AM, please contact night-coverage [email or web address] Password [email or web address]

## 2023-12-03 NOTE — Progress Notes (Signed)
Initial Nutrition Assessment  DOCUMENTATION CODES:   Not applicable  INTERVENTION:  Continue with current diet Multi vitamin Diet education for dialysis verbal and handouts provided.    NUTRITION DIAGNOSIS:   Increased nutrient needs related to acute illness as evidenced by estimated needs.    GOAL:   Patient will meet greater than or equal to 90% of their needs    MONITOR:   PO intake  REASON FOR ASSESSMENT:   Consult Diet education, Assessment of nutrition requirement/status (New ESRD)  ASSESSMENT:  63 y.o. M presented to ED from home with complaints of increasing SOB, and extreme tenderness over the past 3 days. Admitting with acute heart failure with preserved ejection fraction. PMH; CKD Stg IV not on dialysis, AV graft in place, HFpEF, COPD, OSA, gout HTN, HLD. Patient alert and orientated to name date and time. Was very receptive to education at time of RD visit. He reports that he has not had any weight loss or appetite changes states that the food has been good. RD verbally educated Patient on nutrition and dialysis along with providing educational hand out and video upon discharge from hospital.    Hospital weight history: 12/03/23 0430 83.1 kg 183.2 lbs  12/02/23 1116 78.2 kg 172.4 lbs  12/02/23 0756 81 kg 178.57 lbs  12/02/23 0519 79.2 kg 174.5 lbs  12/01/23 0424 78.2 kg 172.3 lbs  11/30/23 1822 79.5 kg 175.27 lbs  11/30/23 0323 81.9 kg 180.56 lbs  11/29/23 1955 82.5 kg 181.88 lbs  11/29/23 1635 84 kg 185.19 lbs  11/29/23 0749 81.6 kg 180 lbs  11/28/23 2344 81.6 kg 180 lbs      Average Meal Intake: 25-100: 89% intake x 8 recorded meals  Nutritionally Relevant Medications: Scheduled Meds:  allopurinol  100 mg Oral Daily   atorvastatin  40 mg Oral Daily   furosemide  80 mg Intravenous BID   levothyroxine  100 mcg Oral Daily   sodium bicarbonate  1,300 mg Oral BID    Labs Reviewed: CBG ranges from 131-121 mg/dL over the last 24 hours      NUTRITION - FOCUSED PHYSICAL EXAM:  Flowsheet Row Most Recent Value  Orbital Region No depletion  Upper Arm Region No depletion  Thoracic and Lumbar Region No depletion  Buccal Region No depletion  Temple Region No depletion  Clavicle Bone Region No depletion  Clavicle and Acromion Bone Region No depletion  Scapular Bone Region No depletion  Dorsal Hand No depletion  Patellar Region No depletion  Anterior Thigh Region No depletion  Posterior Calf Region No depletion  Edema (RD Assessment) Mild  Hair Reviewed  Eyes Reviewed  Mouth Reviewed  Skin Reviewed  Nails Reviewed       Diet Order:   Diet Order             Diet Heart Room service appropriate? Yes; Fluid consistency: Thin  Diet effective now                   EDUCATION NEEDS:   Education needs have been addressed  Skin:  Skin Assessment: Reviewed RN Assessment  Last BM:  11/30/23  Height:   Ht Readings from Last 1 Encounters:  11/28/23 6' (1.829 m)    Weight:   Wt Readings from Last 1 Encounters:  12/03/23 83.1 kg    Ideal Body Weight:     BMI:  Body mass index is 24.85 kg/m.  Estimated Nutritional Needs:   Kcal:  2200-2500 kcal  Protein:  110-135  g  Fluid:  Urine out put + 1L    Jamelle Haring RDN, LDN Clinical Dietitian   If unable to reach, please contact "RD Inpatient" secure chat group between 8 am-4 pm daily"

## 2023-12-03 NOTE — Progress Notes (Addendum)
Patient ID: Johnny [name], male   DOB: May 27, 1961, 62 y.o.   MRN: [id number] S: No new complaints and feels well. O:BP 137/85 (BP Location: Right Arm)   Pulse 80   Temp 98.4 F (36.9 C) (Oral)   Resp 19   Ht 6' (1.829 m)   Wt 83.1 kg   SpO2 100%   BMI 24.85 kg/m   Intake/Output Summary (Last 24 hours) at 12/03/2023 1102 Last data filed at 12/03/2023 0819 Gross per 24 hour  Intake 420 ml  Output 3550 ml  Net -3130 ml   Intake/Output: I/O last 3 completed shifts: In: 180 [P.O.:180] Out: 3600 [Urine:1600; Other:2000]  Intake/Output this shift:  Total I/O In: 240 [P.O.:240] Out: 250 [Urine:250] Weight change: 1.847 kg Gen: NAD CVS: RRR Resp: CTA Abd: +_BS, soft, NT/ND Ext: no edema, LUE AVF +T/B  Recent Labs  Lab 11/28/23 2351 11/30/23 0245 11/30/23 1336 12/01/23 0227 12/02/23 0239 12/03/23 0232  NA 136 135  --  136 136 138  K 5.4* 4.1  --  4.2 4.1 3.6  CL 102 100  --  99 98 98  CO2 22 23  --  26 21* 28  GLUCOSE 100* 85  --  89 131* 121*  BUN 103* 69*  --  53* 64* 45*  CREATININE 14.90* 10.42*  --  8.09* 9.27* 7.01*  ALBUMIN 3.3*  --   --   --   --   --   CALCIUM 7.9* 8.1*  --  7.9* 7.8* 7.6*  PHOS  --   --  4.9*  --   --   --   AST 24  --   --   --   --   --   ALT 18  --   --   --   --   --    Liver Function Tests: Recent Labs  Lab 11/28/23 2351  AST 24  ALT 18  ALKPHOS 59  BILITOT 0.9  PROT 7.2  ALBUMIN 3.3*   No results for input(s): "LIPASE", "AMYLASE" in the last 168 hours. No results for input(s): "AMMONIA" in the last 168 hours. CBC: Recent Labs  Lab 11/28/23 2351 11/29/23 0843 11/30/23 0245 12/02/23 0239  WBC 7.1 8.8 7.0 7.4  NEUTROABS 4.6 7.6  --   --   HGB 6.5* 7.3* 7.3* 8.6*  HCT 19.9* 21.8* 22.0* 26.1*  MCV 70.8* 72.7* 71.2* 72.1*  PLT 141* 135* 135* 158   Cardiac Enzymes: No results for input(s): "CKTOTAL", "CKMB", "CKMBINDEX", "TROPONINI" in the last 168 hours. CBG: No results for input(s): "GLUCAP" in the last 168  hours.  Iron Studies:  Recent Labs    11/30/23 1336  FERRITIN 583*   Studies/Results: No results found.  allopurinol  100 mg Oral Daily   atorvastatin  40 mg Oral Daily   Chlorhexidine Gluconate Cloth  6 each Topical Q0600   Chlorhexidine Gluconate Cloth  6 each Topical Q0600   furosemide  80 mg Intravenous BID   guaiFENesin  600 mg Oral BID   labetalol  300 mg Oral BID   levothyroxine  100 mcg Oral Daily   mometasone-formoterol  2 puff Inhalation BID   sodium bicarbonate  1,300 mg Oral BID   triamcinolone cream   Topical BID    BMET    Component Value Date/Time   NA 138 12/03/2023 0232   NA 135 11/08/2020 1654   K 3.6 12/03/2023 0232   CL 98 12/03/2023 0232   CO2 28 12/03/2023 0232  GLUCOSE 121 (H) 12/03/2023 0232   BUN 45 (H) 12/03/2023 0232   BUN 42 (H) 11/08/2020 1654   CREATININE 7.01 (H) 12/03/2023 0232   CREATININE 2.78 (H) 12/07/2016 1006   CALCIUM 7.6 (L) 12/03/2023 0232   CALCIUM 6.7 (LL) 10/25/2023 0916   GFRNONAA 8 (L) 12/03/2023 0232   GFRNONAA 25 (L) 12/07/2016 1006   GFRAA 13 (L) 11/08/2020 1654   GFRAA 29 (L) 12/07/2016 1006   CBC    Component Value Date/Time   WBC 7.4 12/02/2023 0239   RBC 3.62 (L) 12/02/2023 0239   HGB 8.6 (L) 12/02/2023 0239   HGB 14.2 11/08/2020 1654   HCT 26.1 (L) 12/02/2023 0239   HCT 41.7 11/08/2020 1654   PLT 158 12/02/2023 0239   PLT 183 11/08/2020 1654   MCV 72.1 (L) 12/02/2023 0239   MCV 77 (L) 11/08/2020 1654   MCH 23.8 (L) 12/02/2023 0239   MCHC 33.0 12/02/2023 0239   RDW 22.6 (H) 12/02/2023 0239   RDW 15.8 (H) 11/08/2020 1654   LYMPHSABS 0.7 11/29/2023 0843   LYMPHSABS 2.5 11/08/2020 1654   MONOABS 0.2 11/29/2023 0843   EOSABS 0.3 11/29/2023 0843   EOSABS 0.4 11/08/2020 1654   BASOSABS 0.1 11/29/2023 0843   BASOSABS 0.1 11/08/2020 1654    Johnny [name] is an 63 y.o. male with a history of substance abuse, HLD, HTN, HCV, HFpEF, COPD, OSA, gout, CKD 5 followed by Dr. Valentino Nose but last seen by Dr. Signe Colt.   Patient had agreed to start dialysis but changed his mind and refused subsequently.  Normally on torsemide 100 mg daily and was actually referred to [facility] was on undergoing transplant evaluation.  Here with SOB. In the ED Cr 14.9 and BNP of 2733 with a hemoglobin of 6.5.  Chest x-ray showed vascular congestion.    Assessment/Plan: CKD5 by Dr. Valentino Nose last seen by Dr. Signe Colt.  Patient has been told numerous times he needs to be on dialysis and actually finally went to [facility] unit for a tour recently but declined to start.  Counseled him at length that he really needs to start, creatinine is even higher than baseline and he did not have edema during his last clinic visit.  He has 2+ edema in lower extremities now as well as vascular congestion and certainly needs to be started on dialysis.  Agreeable. On bicarbonate 2 tablets twice daily at home. -Tolerated 1st dialysis Fri nad #2 Sat 3-hour low efficiency treatment to decrease the risk of dialysis disequilibrium.  Had 3rd treatment on 12/02/23 and Plan MWF. -CLIP process already initiated and awaiting acceptance   Additional recommendations - Dose all meds for creatinine clearance < 10 ml/min  - Unless absolutely necessary, no MRIs with gadolinium.  - Prefer needle sticks in the dorsum of the hands or wrists.  No blood pressure measurements in right arm. - If blood transfusion is requested during hemodialysis sessions, please alert [place] prior to the session.    Avoid nephrotoxic medications including NSAIDs and iodinated intravenous contrast exposure unless the latter is absolutely indicated.  Preferred narcotic agents for pain control are hydromorphone, fentanyl, and methadone. Morphine should not be used. Avoid Baclofen and avoid oral sodium phosphate and magnesium citrate based laxatives / bowel preps. Continue strict Input and Output monitoring. Will monitor the patient closely with you and intervene or adjust therapy as indicated by changes in  clinical status/labs    HFpEF EF 60 to 65% -usually on torsemide 100 mg daily at  home Renal osteodystrophy -calcitriol 0.5 mg 2 tablets daily.  Currently not on a binder.  Will check a phosphorus level and almost certainly will need to start a binder Anemia - Retacrit 30,000 units every 2 weeks.  Will switch to Aranesp and follow Hgb and iron stores. -TSAT 21%, no ferritin -> will load with 500mg  of iv iron 1st Hypertension on amlodipine 10 mg, labetalol 300 mg twice daily COPD -breathing treatments per primary team OSA Hypothyroidism -on Synthroid Disposition - he has been accepted to [facility] on TTS schedule and will go to outpatient HD on Thursday 12/05/23.  Irena Cords, MD [employer] [phone]

## 2023-12-03 NOTE — Progress Notes (Addendum)
Pt has been accepted at [facility] on TTS 6:00 am chair time. Pt can start on Thursday and will need to arrive at 5:45 am. Clinic has requested that pt come to clinic tomorrow to complete paperwork prior to treatment on Thursday. Pt can go to clinic 8:30 am to 1:30 pm or 2:30 pm to 4:30 pm. Met with pt at bedside to discuss details. Pt agreeable to plans and schedule letter provided. Arrangements added to AVS as well. Update provided to attending, nephrologist, RN CM, and pt's RN. Contacted renal PA regarding clinic's need for orders. Will assist as needed.   Olivia Canter Renal Navigator [phone]  Addendum at 2:33 pm: Pt to d/c today. Contacted [facility] to be advised pt will d/c today and to try to come to clinic tomorrow for paperwork. Renal PA made aware of d/c today as well so orders can be sent to clinic.

## 2023-12-03 NOTE — Progress Notes (Signed)
[facility] Patient Discharge Orders- [facility] CLINIC: [id number]  Patient's name: DEVLIN BRINK Admit/DC Dates: 11/28/2023 - 12/03/2023  Discharge Diagnoses: Sob / vol overload    CKD stage V now ESRD   Aranesp: Given: yes   Date and amount of last dose:  Last Hgb: 7.3 PRBC's Given: 1 Date/# of units: 11/29/23 ESA dose for discharge: mircera 100 mcg IV q 2 weeks  IV Iron dose at discharge: 100 mg  q hd  till 02/.07/25  Heparin change: 0  ALLERGY  EDW Change:  to establish  New EDW: 79.5  [place]ath Change: no  Access intervention/Change: no Details:  Hectorol/Calcitriol change: no  Discharge Labs: Calcium7.9 Phosphorus 6.0 Albumin 3.3 K+ 3.6  IV Antibiotics: no Details:  On Coumadin?: no Last INR: Next INR: Managed By:   OTHER/APPTS/LAB ORDERS:    D/C Meds to be reconciled by nurse after every discharge.  Completed By:   Reviewed by: MD:______ RN_______

## 2023-12-04 ENCOUNTER — Telehealth: Payer: Self-pay

## 2023-12-04 NOTE — Transitions of Care (Post Inpatient/ED Visit) (Signed)
   12/04/2023  Name: Johnny [name] MRN: [id number] DOB: June 28, 1961  Today's TOC FU Call Status: Today's TOC FU Call Status:: Unsuccessful Call (1st Attempt) Unsuccessful Call (1st Attempt) Date: 12/04/23  Attempted to reach the patient regarding the most recent Inpatient/ED visit.  Follow Up Plan: Additional outreach attempts will be made to reach the patient to complete the Transitions of Care (Post Inpatient/ED visit) call.   He has an appointment with Georgian Co, PA at [facility] on 12/18/2023.   Signature  Robyne Peers, RN

## 2023-12-04 NOTE — Transitions of Care (Post Inpatient/ED Visit) (Signed)
   12/04/2023  Name: Johnny [name] MRN: [id number] DOB: 29-May-1961  Today's TOC FU Call Status: Today's TOC FU Call Status:: Unsuccessful Call (1st Attempt) Unsuccessful Call (1st Attempt) Date: 12/04/23  Attempted to reach the patient regarding the most recent Inpatient/ED visit.  Follow Up Plan: Additional outreach attempts will be made to reach the patient to complete the Transitions of Care (Post Inpatient/ED visit) call.   [name], RN, BA, [facility], CRRN [facility] Coordinator, Transition of Care Ph # [phone]

## 2023-12-05 ENCOUNTER — Telehealth: Payer: Self-pay

## 2023-12-05 NOTE — Telephone Encounter (Signed)
Call received from Denise/ [facility]. She explained that it was their error that the patient did not receive dialysis this morning  They have talked to him and he will be coming tomorrow, 12/06/2023 and Saturday 12/07/2023 so he will not miss a dialysis session this week.

## 2023-12-05 NOTE — Transitions of Care (Post Inpatient/ED Visit) (Signed)
12/05/2023  Name: Johnny [name] MRN: [id number] DOB: 1961-09-18  Today's TOC FU Call Status: Today's TOC FU Call Status:: Successful TOC FU Call Completed Unsuccessful Call (1st Attempt) Date: 12/04/23 [facility] FU Call Complete Date: 12/05/23 Patient's Name and Date of Birth confirmed.  Transition Care Management Follow-up Telephone Call Date of Discharge: 12/03/23 Discharge Facility: [facility]) Type of Discharge: Inpatient Admission Primary Inpatient Discharge Diagnosis:: acute heart failure with presered ejection fraction How have you been since you were released from the hospital?: Better Any questions or concerns?: No  Items Reviewed: Did you receive and understand the discharge instructions provided?: Yes Medications obtained,verified, and reconciled?: Partial Review Completed Reason for Partial Mediation Review: he said he has all medications, nothing is new and he he understands that he is to stop taking the amlodipine.  He said he doesn't ahve any questions about the medications and did not need to review the med list.  He stated he does not have a nebulizer but got a call about delivery. He was not sure what DME company called but he said he will call them back. Any new allergies since your discharge?: No Dietary orders reviewed?: Yes Type of Diet Ordered:: heart healthy low sodium Do you have support at home?: No (He said he lives alone and has no help.)  Medications Reviewed Today: Medications Reviewed Today   Medications were not reviewed in this encounter     Home Care and Equipment/Supplies: Were Home Health Services Ordered?: No Any new equipment or medical supplies ordered?: No  Functional Questionnaire: Do you need assistance with bathing/showering or dressing?: No Do you need assistance with meal preparation?: No Do you need assistance with eating?: No Do you have difficulty maintaining continence: No Do you need assistance with getting out of bed/getting  out of a chair/moving?: Yes (He said he has a cane to use if needed) Do you have difficulty managing or taking your medications?: No  Follow up appointments reviewed: PCP Follow-up appointment confirmed?: Yes Date of PCP follow-up appointment?: 12/18/23 Follow-up Provider: Corene Cornea, PA  He understands that this appointment is on a non -dialysis day Specialist Hospital Follow-up appointment confirmed?: Yes Date of Specialist follow-up appointment?: 12/06/23 Follow-Up Specialty Provider:: He was supposed to start dialysis today and went there but he said they would not see him because he did not complete his paperwork.  he said he is going back tomorrow and will finish the paperwork and start dialysis. Do you need transportation to your follow-up appointment?: No (he said he uses [facility]) Do you understand care options if your condition(s) worsen?: Yes-patient verbalized understanding  I called [facility] : [phone] and spoke to Bradford, charge nurse about patient's dialysis schedule.  She said she just got to work and he was gone prior to her arrival.  I explained what he told me about not signing all of the paperwork and being told to come back tomorrow to complete it and to start dialysis. Angelique Blonder said that she will need to speak to their director because she is not sure why he would not have started dialysis today.  He just needs to sign the consent, not complete all of the paperwork prior to starting dialysis. She then said she did not see him on the schedule for tomorrow. I explained to her that his AVS states his HD schedule is T/T S with 0600 chair time.  Angelique Blonder said she will look into it and get back to me with  any update.     SIGNATURE Robyne Peers, RN

## 2023-12-06 ENCOUNTER — Telehealth: Payer: Self-pay

## 2023-12-06 NOTE — Telephone Encounter (Signed)
Copied from CRM [id number]. Topic: General - Other >> Dec 06, 2023  3:13 PM Fonda Kinder J wrote: Reason for CRM:  Pt states he needs to get into with the lady that helps pts find housing. He said he is unsure of her name but would like for her to get in touch with him if possible Callback# [phone]

## 2023-12-06 NOTE — Transitions of Care (Post Inpatient/ED Visit) (Signed)
   12/06/2023  Name: Johnny [name] MRN: [id number] DOB: 1961-02-06  Today's TOC FU Call Status: Today's TOC FU Call Status:: Unsuccessful Call (3rd Attempt) Unsuccessful Call (3rd Attempt) Date: 12/06/23  Attempted to reach the patient regarding the most recent Inpatient/ED visit.  Follow Up Plan: No further outreach attempts will be made at this time. We have been unable to contact the patient.  A successful TOC follow up was completed by [facility] RN Robyne Peers on 12/05/23.  [name], RN, BA, [facility], CRRN [facility] Coordinator, Transition of Care Ph # [phone]

## 2023-12-07 ENCOUNTER — Other Ambulatory Visit: Payer: Self-pay | Admitting: Family Medicine

## 2023-12-07 DIAGNOSIS — I10 Essential (primary) hypertension: Secondary | ICD-10-CM

## 2023-12-09 NOTE — Telephone Encounter (Signed)
Copied from CRM [id number]. Topic: Clinical - Medical Advice >> Dec 09, 2023  8:08 AM [name] wrote: Reason for CRM: Patient states he had some undesirable reaction to his dialysis, and wants to speak with Dr Alvis Lemmings about this. Patient states that he was told he could get some spray would numb the area used for the needles and would like a prescription for it. His next about is in 2 weeks. Callback number is [phone]

## 2023-12-16 ENCOUNTER — Emergency Department (HOSPITAL_COMMUNITY): Payer: MEDICAID

## 2023-12-16 ENCOUNTER — Encounter (HOSPITAL_COMMUNITY): Payer: Self-pay | Admitting: Internal Medicine

## 2023-12-16 ENCOUNTER — Other Ambulatory Visit: Payer: Self-pay

## 2023-12-16 ENCOUNTER — Inpatient Hospital Stay (HOSPITAL_COMMUNITY)
Admission: EM | Admit: 2023-12-16 | Discharge: 2023-12-18 | DRG: 640 | Disposition: A | Discharge status: Home / Self Care | Payer: MEDICAID | Attending: Internal Medicine | Admitting: Internal Medicine

## 2023-12-16 DIAGNOSIS — D649 Anemia, unspecified: Principal | ICD-10-CM | POA: Diagnosis present

## 2023-12-16 DIAGNOSIS — Z992 Dependence on renal dialysis: Secondary | ICD-10-CM

## 2023-12-16 DIAGNOSIS — Z8249 Family history of ischemic heart disease and other diseases of the circulatory system: Secondary | ICD-10-CM

## 2023-12-16 DIAGNOSIS — Z820 Family history of epilepsy and other diseases of the nervous system: Secondary | ICD-10-CM

## 2023-12-16 DIAGNOSIS — Z7951 Long term (current) use of inhaled steroids: Secondary | ICD-10-CM

## 2023-12-16 DIAGNOSIS — J441 Chronic obstructive pulmonary disease with (acute) exacerbation: Secondary | ICD-10-CM | POA: Diagnosis not present

## 2023-12-16 DIAGNOSIS — M1A30X0 Chronic gout due to renal impairment, unspecified site, without tophus (tophi): Secondary | ICD-10-CM

## 2023-12-16 DIAGNOSIS — Z87891 Personal history of nicotine dependence: Secondary | ICD-10-CM

## 2023-12-16 DIAGNOSIS — D631 Anemia in chronic kidney disease: Secondary | ICD-10-CM | POA: Diagnosis present

## 2023-12-16 DIAGNOSIS — Z79890 Hormone replacement therapy (postmenopausal): Secondary | ICD-10-CM

## 2023-12-16 DIAGNOSIS — I132 Hypertensive heart and chronic kidney disease with heart failure and with stage 5 chronic kidney disease, or end stage renal disease: Secondary | ICD-10-CM | POA: Diagnosis present

## 2023-12-16 DIAGNOSIS — J449 Chronic obstructive pulmonary disease, unspecified: Secondary | ICD-10-CM | POA: Diagnosis present

## 2023-12-16 DIAGNOSIS — Z832 Family history of diseases of the blood and blood-forming organs and certain disorders involving the immune mechanism: Secondary | ICD-10-CM

## 2023-12-16 DIAGNOSIS — J4489 Other specified chronic obstructive pulmonary disease: Secondary | ICD-10-CM | POA: Diagnosis present

## 2023-12-16 DIAGNOSIS — Z860101 Personal history of adenomatous and serrated colon polyps: Secondary | ICD-10-CM

## 2023-12-16 DIAGNOSIS — N186 End stage renal disease: Secondary | ICD-10-CM | POA: Diagnosis not present

## 2023-12-16 DIAGNOSIS — Z91013 Allergy to seafood: Secondary | ICD-10-CM

## 2023-12-16 DIAGNOSIS — M109 Gout, unspecified: Secondary | ICD-10-CM | POA: Diagnosis present

## 2023-12-16 DIAGNOSIS — M1A00X0 Idiopathic chronic gout, unspecified site, without tophus (tophi): Secondary | ICD-10-CM

## 2023-12-16 DIAGNOSIS — I160 Hypertensive urgency: Secondary | ICD-10-CM | POA: Diagnosis not present

## 2023-12-16 DIAGNOSIS — Z5189 Encounter for other specified aftercare: Secondary | ICD-10-CM

## 2023-12-16 DIAGNOSIS — Z79899 Other long term (current) drug therapy: Secondary | ICD-10-CM

## 2023-12-16 DIAGNOSIS — Z886 Allergy status to analgesic agent status: Secondary | ICD-10-CM

## 2023-12-16 DIAGNOSIS — Z841 Family history of disorders of kidney and ureter: Secondary | ICD-10-CM

## 2023-12-16 DIAGNOSIS — Z1152 Encounter for screening for COVID-19: Secondary | ICD-10-CM

## 2023-12-16 DIAGNOSIS — G4733 Obstructive sleep apnea (adult) (pediatric): Secondary | ICD-10-CM | POA: Diagnosis present

## 2023-12-16 DIAGNOSIS — E7800 Pure hypercholesterolemia, unspecified: Secondary | ICD-10-CM

## 2023-12-16 DIAGNOSIS — Z8261 Family history of arthritis: Secondary | ICD-10-CM

## 2023-12-16 DIAGNOSIS — R0902 Hypoxemia: Secondary | ICD-10-CM | POA: Diagnosis present

## 2023-12-16 DIAGNOSIS — E039 Hypothyroidism, unspecified: Secondary | ICD-10-CM | POA: Diagnosis present

## 2023-12-16 DIAGNOSIS — E785 Hyperlipidemia, unspecified: Secondary | ICD-10-CM | POA: Diagnosis present

## 2023-12-16 DIAGNOSIS — Z833 Family history of diabetes mellitus: Secondary | ICD-10-CM

## 2023-12-16 DIAGNOSIS — E8770 Fluid overload, unspecified: Principal | ICD-10-CM | POA: Diagnosis present

## 2023-12-16 DIAGNOSIS — I5032 Chronic diastolic (congestive) heart failure: Secondary | ICD-10-CM | POA: Diagnosis present

## 2023-12-16 DIAGNOSIS — N2581 Secondary hyperparathyroidism of renal origin: Secondary | ICD-10-CM | POA: Diagnosis present

## 2023-12-16 DIAGNOSIS — Z888 Allergy status to other drugs, medicaments and biological substances status: Secondary | ICD-10-CM

## 2023-12-16 LAB — CBC
HCT: 18.4 % — ABNORMAL LOW (ref 39.0–52.0)
Hemoglobin: 6 g/dL — CL (ref 13.0–17.0)
MCH: 24.3 pg — ABNORMAL LOW (ref 26.0–34.0)
MCHC: 32.6 g/dL (ref 30.0–36.0)
MCV: 74.5 fL — ABNORMAL LOW (ref 80.0–100.0)
Platelets: 133 10*3/uL — ABNORMAL LOW (ref 150–400)
RBC: 2.47 MIL/uL — ABNORMAL LOW (ref 4.22–5.81)
RDW: 23.9 % — ABNORMAL HIGH (ref 11.5–15.5)
WBC: 9.2 10*3/uL (ref 4.0–10.5)
nRBC: 0 % (ref 0.0–0.2)

## 2023-12-16 LAB — BASIC METABOLIC PANEL
Anion gap: 15 (ref 5–15)
BUN: 41 mg/dL — ABNORMAL HIGH (ref 6–20)
CO2: 25 mmol/L (ref 22–32)
Calcium: 8 mg/dL — ABNORMAL LOW (ref 8.9–10.3)
Chloride: 96 mmol/L — ABNORMAL LOW (ref 98–111)
Creatinine, Ser: 7.83 mg/dL — ABNORMAL HIGH (ref 0.61–1.24)
GFR, Estimated: 7 mL/min — ABNORMAL LOW (ref >60)
Glucose, Bld: 115 mg/dL — ABNORMAL HIGH (ref 70–99)
Potassium: 3.5 mmol/L (ref 3.5–5.1)
Sodium: 136 mmol/L (ref 135–145)

## 2023-12-16 LAB — RESP PANEL BY RT-PCR (RSV, FLU A&B, COVID)  RVPGX2
Influenza A by PCR: NEGATIVE
Influenza B by PCR: NEGATIVE
Resp Syncytial Virus by PCR: NEGATIVE
SARS Coronavirus 2 by RT PCR: NEGATIVE

## 2023-12-16 LAB — PREPARE RBC (CROSSMATCH)

## 2023-12-16 LAB — POC OCCULT BLOOD, ED: Fecal Occult Bld: NEGATIVE

## 2023-12-16 MED ORDER — ATORVASTATIN CALCIUM 40 MG PO TABS
40.0000 mg | ORAL_TABLET | Freq: Every day | ORAL | Status: DC
  Administered 2023-12-16 – 2023-12-18 (×3): 40 mg via ORAL
  Filled 2023-12-16 (×3): qty 1

## 2023-12-16 MED ORDER — MOMETASONE FURO-FORMOTEROL FUM 100-5 MCG/ACT IN AERO
2.0000 | INHALATION_SPRAY | Freq: Two times a day (BID) | RESPIRATORY_TRACT | Status: DC
  Filled 2023-12-16: qty 8.8

## 2023-12-16 MED ORDER — ACETAMINOPHEN 650 MG RE SUPP: 650.0000 mg | Freq: Four times a day (QID) | RECTAL | Status: DC | PRN

## 2023-12-16 MED ORDER — LEVOTHYROXINE SODIUM 100 MCG PO TABS
100.0000 ug | ORAL_TABLET | Freq: Every day | ORAL | Status: DC
  Administered 2023-12-17 – 2023-12-18 (×2): 100 ug via ORAL
  Filled 2023-12-16 (×2): qty 1

## 2023-12-16 MED ORDER — ALLOPURINOL 100 MG PO TABS
100.0000 mg | ORAL_TABLET | Freq: Every day | ORAL | Status: DC
  Administered 2023-12-16 – 2023-12-18 (×3): 100 mg via ORAL
  Filled 2023-12-16 (×3): qty 1

## 2023-12-16 MED ORDER — HYDRALAZINE HCL 20 MG/ML IJ SOLN: 10.0000 mg | INTRAMUSCULAR | Status: DC | PRN

## 2023-12-16 MED ORDER — ACETAMINOPHEN 325 MG PO TABS: 650.0000 mg | ORAL_TABLET | Freq: Four times a day (QID) | ORAL | Status: DC | PRN

## 2023-12-16 MED ORDER — IPRATROPIUM-ALBUTEROL 0.5-2.5 (3) MG/3ML IN SOLN
3.0000 mL | Freq: Four times a day (QID) | RESPIRATORY_TRACT | Status: DC
  Administered 2023-12-16 – 2023-12-17 (×2): 3 mL via RESPIRATORY_TRACT
  Filled 2023-12-16 (×2): qty 3

## 2023-12-16 MED ORDER — LABETALOL HCL 300 MG PO TABS
300.0000 mg | ORAL_TABLET | Freq: Two times a day (BID) | ORAL | Status: DC
  Administered 2023-12-16 – 2023-12-18 (×4): 300 mg via ORAL
  Filled 2023-12-16 (×7): qty 1

## 2023-12-16 MED ORDER — TORSEMIDE 20 MG PO TABS
100.0000 mg | ORAL_TABLET | Freq: Every day | ORAL | Status: DC
  Administered 2023-12-16 – 2023-12-18 (×3): 100 mg via ORAL
  Filled 2023-12-16 (×3): qty 5

## 2023-12-16 MED ORDER — SODIUM CHLORIDE 0.9% IV SOLUTION: Freq: Once | INTRAVENOUS | Status: AC

## 2023-12-16 MED ORDER — SODIUM CHLORIDE 0.9% FLUSH
3.0000 mL | Freq: Two times a day (BID) | INTRAVENOUS | Status: DC
  Administered 2023-12-16 – 2023-12-18 (×5): 3 mL via INTRAVENOUS

## 2023-12-16 MED ORDER — MOMETASONE FURO-FORMOTEROL FUM 100-5 MCG/ACT IN AERO
2.0000 | INHALATION_SPRAY | Freq: Two times a day (BID) | RESPIRATORY_TRACT | Status: DC
  Administered 2023-12-17 – 2023-12-18 (×3): 2 via RESPIRATORY_TRACT
  Filled 2023-12-16: qty 8.8

## 2023-12-16 MED ORDER — ALBUTEROL SULFATE (2.5 MG/3ML) 0.083% IN NEBU
2.5000 mg | INHALATION_SOLUTION | Freq: Four times a day (QID) | RESPIRATORY_TRACT | Status: DC | PRN
  Administered 2023-12-16 (×2): 2.5 mg via RESPIRATORY_TRACT
  Filled 2023-12-16 (×2): qty 3

## 2023-12-16 NOTE — H&P (Signed)
 History and Physical    Patient: Johnny [name]:[id number] DOB: 1961-10-19 DOA: 12/16/2023 DOS: the patient was seen and examined on 12/16/2023 PCP: Joaquin Mulberry, MD  Patient coming from: Home  Chief Complaint:  Chief Complaint  Patient presents with   Shortness of Breath   Cough   Fatigue   HPI: Johnny [name] is a 63 y.o. male with medical history significant of HTN, HLD, ESRD, HFpEF, COPD, hypothyroidism, OSA, gout, presents with complaints of fatigue   and shortness of breath.  Patient reports that he has been experiencing a cough and difficulty breathing for the past 3 days, which has disrupted his sleep.  He is concerned that his blood counts may be low contributing to his symptoms as he is also felt very tired.  Denies having any significant fever, chills, or blood in his stools.  He did report that during his last dialysis session they had taken a significant amount blood.  He recently started dialysis during last hospitalization from 1/23-1/24 after having a fistula in place for nearly 20 years prior to being started on hemodialysis.  During his hospitalization stay he was transferred out of PRBCs while in the emergency department and had a negative fecal occult.  He is currently on a Tuesday, Thursday, and Saturday schedule.  Patient reported having significant amount of blood drawn during his last dialysis session he has also reported experiencing headaches but he attributes this to his blood pressures being elevated.  He reports a history of smoking but quit over 15 years ago.  On admission into the emergency department patient was noted to be afebrile with respirations 16-31, blood pressures elevated up to 156/89, and all other vital signs maintained.  Labs significant for hemoglobin 6, platelets 133, potassium 3.5, BUN 41, creatinine 7.83, and calcium  8.  Stool guaiac was noted to be negative.  Patient typed and screened and ordered to a packed red blood cells.  Chest x-ray  revealed findings favoring congestive heart failure with pulmonary edema more pronounced than prior exam from 1/26.  Review of Systems: As mentioned in the history of present illness. All other systems reviewed and are negative. Past Medical History:  Diagnosis Date   Adenomatous colon polyp    tubular   Anemia    Asthma    as a child   CHF (congestive heart failure) (HCC)    Chronic kidney disease    COPD (chronic obstructive pulmonary disease) (HCC)    Diverticulosis    External otitis of right ear 11/10/2018   Gout    Hepatitis C    HLD (hyperlipidemia)    Hypertension    OSA (obstructive sleep apnea) 11/03/2015   Sleep apnea    Strep throat 01/2021   Thyroid  disorder    Past Surgical History:  Procedure Laterality Date   A/V FISTULAGRAM N/A 11/08/2023   Procedure: A/V Fistulagram;  Surgeon: Carlene Che, MD;  Location: [facility] INVASIVE CV LAB;  Service: Vascular;  Laterality: N/A;   AV FISTULA PLACEMENT  09/12/2009   Left arm AVF   Social History:  reports that he quit smoking about 12 years ago. His smoking use included cigarettes. He has never used smokeless tobacco. He reports that he does not drink alcohol and does not use drugs.  Allergies  Allergen Reactions   Heparin      Pt reports he's not sure what type of reaction   Naproxen Other (See Comments)    Unknown reaction    Shellfish Allergy Itching and  Swelling    Family History  Problem Relation Age of Onset   Hypertension Mother    Diabetes Mother    Heart disease Mother    Alzheimer's disease Mother    Arthritis Mother    Anemia Father    Kidney disease Brother    Colon cancer Neg Hx    Colon polyps Neg Hx    Esophageal cancer Neg Hx    Rectal cancer Neg Hx    Stomach cancer Neg Hx     Prior to Admission medications   Medication Sig Start Date End Date Taking? Authorizing Provider  albuterol  (VENTOLIN  HFA) 108 (90 Base) MCG/ACT inhaler Inhale 2 puffs into the lungs every 6 (six) hours as needed for  wheezing or shortness of breath. 12/01/21   Vada Garibaldi, MD  allopurinol  (ZYLOPRIM ) 100 MG tablet Take 1 tablet (100 mg total) by mouth daily. 02/06/23   Newlin, Enobong, MD  atorvastatin  (LIPITOR) 40 MG tablet Take 1 tablet (40 mg total) by mouth daily. 11/20/23   McClung, Angela M, PA-C  colchicine  0.6 MG tablet TAKE 2 TABLETS BY MOUTH AT THE ONSET OF A GOUT FLARE. MAY REPEAT 1 TABLET IN 1 HOUR IF SYMPTOMS PERSIST 10/15/23   Newlin, Enobong, MD  ipratropium-albuterol  (DUONEB) 0.5-2.5 (3) MG/3ML SOLN Inhale 3 mLs into the lungs every 6 (six) hours as needed. 11/27/23   Newlin, Enobong, MD  labetalol  (NORMODYNE ) 300 MG tablet Take 1 tablet by mouth twice daily 12/09/23   Newlin, Enobong, MD  levothyroxine  (SYNTHROID ) 100 MCG tablet Take 100 mcg by mouth daily. 10/15/23   [provider]  mometasone -formoterol  (DULERA ) 100-5 MCG/ACT AERO Inhale 2 puffs into the lungs 2 (two) times daily. 11/07/22   Hassie Lint, PA-C  mupirocin  ointment (BACTROBAN ) 2 % Apply 1 Application topically 2 (two) times daily. 11/07/22   Hassie Lint, PA-C  sodium bicarbonate  650 MG tablet Take 1,300 mg by mouth 2 (two) times daily. 09/06/23   [provider]  torsemide  (DEMADEX ) 100 MG tablet Take 1 tablet (100 mg total) by mouth daily. 11/07/22 11/29/23  Hassie Lint, PA-C  triamcinolone  cream (KENALOG ) 0.1 % APPLY CREAM EXTERNALLY TWICE DAILY 10/23/23   Joaquin Mulberry, MD    Physical Exam: Vitals:   12/16/23 1106 12/16/23 1230  BP: (!) 156/89 (!) 151/95  Pulse: 80 75  Resp: 16 (!) 31  Temp: 98.2 F (36.8 C)   SpO2: 100% 97%    Constitutional: Older adult male who appears to be in no acute distress Eyes: PERRL, lids and conjunctivae normal ENMT: Mucous membranes are moist.   Neck: normal, supple,   Respiratory: Decreased overall aeration with expiratory wheezes appreciated in both lung fields. Cardiovascular: Regular rate and rhythm.  Edwina Gram close on left upper extremity AV fistula present  with palpable thrill. Abdomen: no tenderness, no masses palpated. No hepatosplenomegaly. Bowel sounds positive.  Musculoskeletal: no clubbing / cyanosis. No joint deformity upper and lower extremities. Good ROM, no contractures. Normal muscle tone.  Skin: no rashes, lesions, ulcers. No induration Neurologic: CN 2-12 grossly intact. Sensation intact, DTR normal. Strength 5/5 in all 4.  Psychiatric: Normal judgment and insight. Alert and oriented x 3. Normal mood.   Data Reviewed:  EKG reveals sinus rhythm at 81 bpm with occasional PVCs, and QTc 466.  Reviewed labs, imaging, and pertinent records as documented.  Assessment and Plan:  Symptomatic anemia Patient presents with complaints of fatigue and weakness.  Hemoglobin 6, but previously had been 8.6 when checked on  12/02/2023.  Stool guaiacs were noted to be negative.  Patient had reported significant amount of blood removed during dialysis.  He was typed and screened and ordered 2 units of packed red blood cells. -Admit to the medical telemetry bed -Continue with transfusion of 2 units of packed red blood cells. -Recheck H&H posttransfusion  ESRD on HD Patient just recently started dialysis last month.  Labs noted potassium 3.5, CO2 25, BUN 41, creatinine 7.83, and anion gap 15. -Dr. Jearldine Mina of nephrology consulted, recommends discharge if possible after blood transfusion otherwise they will see if still needing to have hemodialysis in am  Hypertensive urgency On admission blood pressures elevated up to 178/108. -Continue labetalol  and torsemide  -Hydralazine  IV as needed  COPD On physical exam patient noted to have some expiratory wheezes.  Currently O2 saturations maintained on room air. -Continue Dulera  -DuoNebs   Hyperlipidemia -Continue atorvastatin   Hypothyroidism TSH 4.740 when checked on 11/19/2022. -Check TSH -Continue levothyroxine   Gout  -Continue allopurinol    DVT prophylaxis: SCDs Advance Care Planning:   Code  Status: Full Code    Consults: Nephrology  Family Communication: Attempted to call family, but voicemail is full.  Severity of Illness: The appropriate patient status for this patient is OBSERVATION. Observation status is judged to be reasonable and necessary in order to provide the required intensity of service to ensure the patient's safety. The patient's presenting symptoms, physical exam findings, and initial radiographic and laboratory data in the context of their medical condition is felt to place them at decreased risk for further clinical deterioration. Furthermore, it is anticipated that the patient will be medically stable for discharge from the hospital within 2 midnights of admission.   Author: Lena Qualia, MD 12/16/2023 1:16 PM  For on call review [email or web address].

## 2023-12-16 NOTE — ED Notes (Signed)
 Pt coming from imaging

## 2023-12-16 NOTE — ED Notes (Signed)
 This RN received a paper copy informed consent for blood transfusion and/or products.

## 2023-12-16 NOTE — Telephone Encounter (Signed)
 Copied from CRM [id number]. Topic: General - Other >> Dec 16, 2023  8:32 AM Dondra Prader A wrote: Reason for CRM: Kye with [employer] wanted patient PCP to know the order that was sent to them for his nebulizer they had to cancel due to not being able to get in touch with the patient to deliver it.

## 2023-12-16 NOTE — ED Provider Notes (Signed)
[facility] EMERGENC[facility] DEPARTMENT AT [facility] Provider Note   CSN: [id number] Arrival date & time: 12/16/23  1041     History  Chief Complaint  Patient presents with   Shortness of Breath   Cough   Fatigue    Johnny [name] is a 63 y.o. male.  Patient complains of feeling like he is anemic.  Patient states that he has had to have a blood transfusion in the past and he felt the same way.  Patient reports he feels short of breath with walking.  Patient denies any fever or chills he has not had any cough he has not had any congestion.  Patient denies any chest pain.  Patient has a past medical history of renal failure.  Patient states that he just began dialysis last week.  He did have dialysis on Saturday.  Patient reports that he thinks he is anemic because there was an episode with a blood draw at dialysis where he thinks he lost more blood than he should have.  Patient denies any black or tarry looking school stool he has not had any vomiting no diarrhea  The history is provided by the patient. No language interpreter was used.  Shortness of Breath Associated symptoms: cough   Cough Associated symptoms: shortness of breath        Home Medications Prior to Admission medications   Medication Sig Start Date End Date Taking? Authorizing Provider  albuterol (VENTOLIN HFA) 108 (90 Base) MCG/ACT inhaler Inhale 2 puffs into the lungs every 6 (six) hours as needed for wheezing or shortness of breath. 12/01/21   Dorcas Carrow, MD  allopurinol (ZYLOPRIM) 100 MG tablet Take 1 tablet (100 mg total) by mouth daily. 02/06/23   Hoy Register, MD  atorvastatin (LIPITOR) 40 MG tablet Take 1 tablet (40 mg total) by mouth daily. 11/20/23   Anders Simmonds, PA-C  colchicine 0.6 MG tablet TAKE 2 TABLETS BY MOUTH AT THE ONSET OF A GOUT FLARE. MAY REPEAT 1 TABLET IN 1 HOUR IF SYMPTOMS PERSIST 10/15/23   Newlin, Odette Horns, MD  ipratropium-albuterol (DUONEB) 0.5-2.5 (3) MG/3ML SOLN Inhale 3 mLs  into the lungs every 6 (six) hours as needed. 11/27/23   Hoy Register, MD  labetalol (NORMODYNE) 300 MG tablet Take 1 tablet by mouth twice daily 12/09/23   Hoy Register, MD  levothyroxine (SYNTHROID) 100 MCG tablet Take 100 mcg by mouth daily. 10/15/23   [provider]  mometasone-formoterol (DULERA) 100-5 MCG/ACT AERO Inhale 2 puffs into the lungs 2 (two) times daily. 11/07/22   Anders Simmonds, PA-C  mupirocin ointment (BACTROBAN) 2 % Apply 1 Application topically 2 (two) times daily. 11/07/22   Anders Simmonds, PA-C  sodium bicarbonate 650 MG tablet Take 1,300 mg by mouth 2 (two) times daily. 09/06/23   [provider]  torsemide (DEMADEX) 100 MG tablet Take 1 tablet (100 mg total) by mouth daily. 11/07/22 11/29/23  Anders Simmonds, PA-C  triamcinolone cream (KENALOG) 0.1 % APPLY CREAM EXTERNALLY TWICE DAILY 10/23/23   Hoy Register, MD      Allergies    Heparin, Naproxen, and Shellfish allergy    Review of Systems   Review of Systems  Respiratory:  Positive for cough and shortness of breath.   All other systems reviewed and are negative.   Physical Exam Updated Vital Signs BP (!) 151/95   Pulse 75   Temp 98.2 F (36.8 C)   Resp (!) 31   SpO2 97%  Physical  Exam Vitals and nursing note reviewed.  Constitutional:      Appearance: He is well-developed.  HENT:     Head: Normocephalic.  Cardiovascular:     Rate and Rhythm: Normal rate and regular rhythm.  Pulmonary:     Effort: Pulmonary effort is normal.     Breath sounds: Wheezing and rhonchi present. No decreased breath sounds.  Abdominal:     General: There is no distension.     Palpations: Abdomen is soft.  Musculoskeletal:        General: Normal range of motion.     Cervical back: Normal range of motion.  Skin:    General: Skin is warm.  Neurological:     General: No focal deficit present.     Mental Status: He is alert and oriented to person, place, and time.  Psychiatric:        Mood and  Affect: Mood normal.     ED Results / Procedures / Treatments   Labs (all labs ordered are listed, but only abnormal results are displayed) Labs Reviewed  BASIC METABOLIC PANEL - Abnormal; Notable for the following components:      Result Value   Chloride 96 (*)    Glucose, Bld 115 (*)    BUN 41 (*)    Creatinine, Ser 7.83 (*)    Calcium 8.0 (*)    GFR, Estimated 7 (*)    All other components within normal limits  CBC - Abnormal; Notable for the following components:   RBC 2.47 (*)    Hemoglobin 6.0 (*)    HCT 18.4 (*)    MCV 74.5 (*)    MCH 24.3 (*)    RDW 23.9 (*)    Platelets 133 (*)    All other components within normal limits  PREPARE RBC (CROSSMATCH)  TYPE AND SCREEN    EKG EKG Interpretation Date/Time:  Monday December 16 2023 11:22:27 EST Ventricular Rate:  81 PR Interval:  180 QRS Duration:  90 QT Interval:  402 QTC Calculation: 466 R Axis:   47  Text Interpretation: Sinus rhythm with occasional Premature ventricular complexes Nonspecific T wave abnormality Prolonged QT Abnormal ECG When compared with ECG of 02-Dec-2023 14:30, increased ectopy now Confirmed by Meridee Score [phone]) on 12/16/2023 11:25:33 AM  Radiology No results found.  Procedures .Critical Care  Performed by: Elson Areas, PA-C Authorized by: Elson Areas, PA-C   Critical care provider statement:    Critical care time (minutes):  30   Critical care start time:  12/16/2023 11:30 AM   Critical care end time:  12/16/2023 1:11 PM   Critical care time was exclusive of:  Separately billable procedures and treating other patients   Critical care was necessary to treat or prevent imminent or life-threatening deterioration of the following conditions:  Renal failure   Critical care was time spent personally by me on the following activities:  Blood draw for specimens, discussions with primary provider, discussions with consultants, obtaining history from patient or surrogate,  interpretation of cardiac output measurements, examination of patient, review of old charts, pulse oximetry, re-evaluation of patient's condition, ordering and review of radiographic studies and ordering and review of laboratory studies   Care discussed with: admitting provider       Medications Ordered in ED Medications  0.9 %  sodium chloride infusion (Manually program via Guardrails IV Fluids) (has no administration in time range)    ED Course/ Medical Decision Making/ A&P  Medical Decision Making Patient is concerned that he is anemic and needs blood  Amount and/or Complexity of Data Reviewed External Data Reviewed: notes.    Details: Previous notes reviewed Labs: ordered. Decision-making details documented in ED Course.    Details: Labs ordered reviewed and interpreted patient's hemoglobin is 6.0.  Hemoccult is negative Discussion of management or test interpretation with external provider(s): Hospitalist consulted for admission Nephrology consulted as patient may need dialysis  Risk Prescription drug management.           Final Clinical Impression(s) / ED Diagnoses Final diagnoses:  Symptomatic anemia  Encounter for blood transfusion    Rx / DC Orders ED Discharge Orders     None         Elson Areas, PA-C 12/16/23 1312    Royanne Foots, DO 12/23/23 [phone]

## 2023-12-16 NOTE — Plan of Care (Signed)

## 2023-12-16 NOTE — Progress Notes (Signed)
   12/16/23 1559  SDOH Interventions  Transportation Interventions Inpatient TOC   Resources placed on AVS

## 2023-12-16 NOTE — Telephone Encounter (Signed)
 Attempt to call patient to notify of this message. Unable to reach. No answer and voicemail not set up.

## 2023-12-16 NOTE — ED Provider Triage Note (Signed)
Emergency Medicine Provider Triage Evaluation Note  MONTRAVIOUS WEIGELT , a 63 y.o. male  was evaluated in triage.  Pt complains of shortness of breath.  Pt worried that he is anemic   Review of Systems  Positive: Short of breath and weak  Negative: fever  Physical Exam  BP (!) 156/89 (BP Location: Right Arm)   Pulse 80   Temp 98.2 F (36.8 C)   Resp 16   SpO2 100%  Gen:   Awake, no distress   Resp:  Normal effort  MSK:   Moves extremities without difficulty  Other:    Medical Decision Making  Medically screening exam initiated at 11:25 AM.  Appropriate orders placed.  KABIR BRANNOCK was informed that the remainder of the evaluation will be completed by another provider, this initial triage assessment does not replace that evaluation, and the importance of remaining in the ED until their evaluation is complete.     Elson Areas, [place] 12/16/23 1126

## 2023-12-16 NOTE — ED Triage Notes (Signed)
 Pt. Stated, I've had a cough for about a week ago and then I started having SOB  a couple of days . I  am aniemic so I might need some blood cause Im very tired. I just started dialysis 2 weeks.

## 2023-12-17 DIAGNOSIS — N2581 Secondary hyperparathyroidism of renal origin: Secondary | ICD-10-CM | POA: Diagnosis present

## 2023-12-17 DIAGNOSIS — Z79899 Other long term (current) drug therapy: Secondary | ICD-10-CM | POA: Diagnosis not present

## 2023-12-17 DIAGNOSIS — R0902 Hypoxemia: Secondary | ICD-10-CM | POA: Diagnosis present

## 2023-12-17 DIAGNOSIS — Z833 Family history of diabetes mellitus: Secondary | ICD-10-CM | POA: Diagnosis not present

## 2023-12-17 DIAGNOSIS — Z886 Allergy status to analgesic agent status: Secondary | ICD-10-CM | POA: Diagnosis not present

## 2023-12-17 DIAGNOSIS — M109 Gout, unspecified: Secondary | ICD-10-CM | POA: Diagnosis present

## 2023-12-17 DIAGNOSIS — N186 End stage renal disease: Secondary | ICD-10-CM | POA: Diagnosis present

## 2023-12-17 DIAGNOSIS — Z1152 Encounter for screening for COVID-19: Secondary | ICD-10-CM | POA: Diagnosis not present

## 2023-12-17 DIAGNOSIS — Z87891 Personal history of nicotine dependence: Secondary | ICD-10-CM | POA: Diagnosis not present

## 2023-12-17 DIAGNOSIS — E8770 Fluid overload, unspecified: Secondary | ICD-10-CM | POA: Diagnosis present

## 2023-12-17 DIAGNOSIS — I5032 Chronic diastolic (congestive) heart failure: Secondary | ICD-10-CM | POA: Diagnosis present

## 2023-12-17 DIAGNOSIS — Z7951 Long term (current) use of inhaled steroids: Secondary | ICD-10-CM | POA: Diagnosis not present

## 2023-12-17 DIAGNOSIS — Z91013 Allergy to seafood: Secondary | ICD-10-CM | POA: Diagnosis not present

## 2023-12-17 DIAGNOSIS — G4733 Obstructive sleep apnea (adult) (pediatric): Secondary | ICD-10-CM | POA: Diagnosis present

## 2023-12-17 DIAGNOSIS — Z992 Dependence on renal dialysis: Secondary | ICD-10-CM | POA: Diagnosis not present

## 2023-12-17 DIAGNOSIS — I160 Hypertensive urgency: Secondary | ICD-10-CM | POA: Diagnosis present

## 2023-12-17 DIAGNOSIS — E785 Hyperlipidemia, unspecified: Secondary | ICD-10-CM | POA: Diagnosis present

## 2023-12-17 DIAGNOSIS — J4489 Other specified chronic obstructive pulmonary disease: Secondary | ICD-10-CM | POA: Diagnosis present

## 2023-12-17 DIAGNOSIS — Z8249 Family history of ischemic heart disease and other diseases of the circulatory system: Secondary | ICD-10-CM | POA: Diagnosis not present

## 2023-12-17 DIAGNOSIS — Z79890 Hormone replacement therapy (postmenopausal): Secondary | ICD-10-CM | POA: Diagnosis not present

## 2023-12-17 DIAGNOSIS — D631 Anemia in chronic kidney disease: Secondary | ICD-10-CM | POA: Diagnosis present

## 2023-12-17 DIAGNOSIS — Z888 Allergy status to other drugs, medicaments and biological substances status: Secondary | ICD-10-CM | POA: Diagnosis not present

## 2023-12-17 DIAGNOSIS — I132 Hypertensive heart and chronic kidney disease with heart failure and with stage 5 chronic kidney disease, or end stage renal disease: Secondary | ICD-10-CM | POA: Diagnosis present

## 2023-12-17 DIAGNOSIS — D649 Anemia, unspecified: Secondary | ICD-10-CM | POA: Diagnosis not present

## 2023-12-17 DIAGNOSIS — E039 Hypothyroidism, unspecified: Secondary | ICD-10-CM | POA: Diagnosis present

## 2023-12-17 LAB — TYPE AND SCREEN
ABO/RH(D): O POS
Antibody Screen: NEGATIVE
Unit division: 0
Unit division: 0

## 2023-12-17 LAB — CBC
HCT: 23.8 % — ABNORMAL LOW (ref 39.0–52.0)
HCT: 22.2 % — ABNORMAL LOW (ref 39.0–52.0)
Hemoglobin: 7.9 g/dL — ABNORMAL LOW (ref 13.0–17.0)
Hemoglobin: 7.5 g/dL — ABNORMAL LOW (ref 13.0–17.0)
MCH: 25.4 pg — ABNORMAL LOW (ref 26.0–34.0)
MCH: 25.6 pg — ABNORMAL LOW (ref 26.0–34.0)
MCHC: 33.2 g/dL (ref 30.0–36.0)
MCHC: 33.8 g/dL (ref 30.0–36.0)
MCV: 76.5 fL — ABNORMAL LOW (ref 80.0–100.0)
MCV: 75.8 fL — ABNORMAL LOW (ref 80.0–100.0)
Platelets: 137 10*3/uL — ABNORMAL LOW (ref 150–400)
Platelets: 132 10*3/uL — ABNORMAL LOW (ref 150–400)
RBC: 3.11 MIL/uL — ABNORMAL LOW (ref 4.22–5.81)
RBC: 2.93 MIL/uL — ABNORMAL LOW (ref 4.22–5.81)
RDW: 22.7 % — ABNORMAL HIGH (ref 11.5–15.5)
RDW: 22.8 % — ABNORMAL HIGH (ref 11.5–15.5)
WBC: 10.9 10*3/uL — ABNORMAL HIGH (ref 4.0–10.5)
WBC: 10.3 10*3/uL (ref 4.0–10.5)
nRBC: 0 % (ref 0.0–0.2)
nRBC: 0 % (ref 0.0–0.2)

## 2023-12-17 LAB — BPAM RBC
Blood Product Expiration Date: 202503112359
Blood Product Expiration Date: 202503112359
ISSUE DATE / TIME: 202502101749
ISSUE DATE / TIME: 202502102033
Unit Type and Rh: 5100
Unit Type and Rh: 5100

## 2023-12-17 LAB — RENAL FUNCTION PANEL
Albumin: 3.1 g/dL — ABNORMAL LOW (ref 3.5–5.0)
Albumin: 2.9 g/dL — ABNORMAL LOW (ref 3.5–5.0)
Anion gap: 13 (ref 5–15)
Anion gap: 14 (ref 5–15)
BUN: 50 mg/dL — ABNORMAL HIGH (ref 6–20)
BUN: 52 mg/dL — ABNORMAL HIGH (ref 6–20)
CO2: 26 mmol/L (ref 22–32)
CO2: 25 mmol/L (ref 22–32)
Calcium: 8.5 mg/dL — ABNORMAL LOW (ref 8.9–10.3)
Calcium: 8.3 mg/dL — ABNORMAL LOW (ref 8.9–10.3)
Chloride: 98 mmol/L (ref 98–111)
Chloride: 95 mmol/L — ABNORMAL LOW (ref 98–111)
Creatinine, Ser: 9.06 mg/dL — ABNORMAL HIGH (ref 0.61–1.24)
Creatinine, Ser: 9.15 mg/dL — ABNORMAL HIGH (ref 0.61–1.24)
GFR, Estimated: 6 mL/min — ABNORMAL LOW (ref >60)
GFR, Estimated: 6 mL/min — ABNORMAL LOW (ref >60)
Glucose, Bld: 110 mg/dL — ABNORMAL HIGH (ref 70–99)
Glucose, Bld: 101 mg/dL — ABNORMAL HIGH (ref 70–99)
Phosphorus: 4.3 mg/dL (ref 2.5–4.6)
Phosphorus: 4.3 mg/dL (ref 2.5–4.6)
Potassium: 3.8 mmol/L (ref 3.5–5.1)
Potassium: 3.7 mmol/L (ref 3.5–5.1)
Sodium: 137 mmol/L (ref 135–145)
Sodium: 134 mmol/L — ABNORMAL LOW (ref 135–145)

## 2023-12-17 LAB — HEPATITIS B SURFACE ANTIGEN: Hepatitis B Surface Ag: NONREACTIVE

## 2023-12-17 MED ORDER — LIDOCAINE HCL (PF) 1 % IJ SOLN: 5.0000 mL | INTRAMUSCULAR | Status: DC | PRN

## 2023-12-17 MED ORDER — LIDOCAINE-PRILOCAINE 2.5-2.5 % EX CREA: 1.0000 | TOPICAL_CREAM | CUTANEOUS | Status: DC | PRN

## 2023-12-17 MED ORDER — ALTEPLASE 2 MG IJ SOLR: 2.0000 mg | Freq: Once | INTRAMUSCULAR | Status: DC | PRN

## 2023-12-17 MED ORDER — CHLORHEXIDINE GLUCONATE CLOTH 2 % EX PADS
6.0000 | MEDICATED_PAD | Freq: Every day | CUTANEOUS | Status: DC
  Administered 2023-12-17 – 2023-12-18 (×2): 6 via TOPICAL

## 2023-12-17 MED ORDER — IPRATROPIUM-ALBUTEROL 0.5-2.5 (3) MG/3ML IN SOLN
3.0000 mL | Freq: Three times a day (TID) | RESPIRATORY_TRACT | Status: DC
  Administered 2023-12-17 – 2023-12-18 (×3): 3 mL via RESPIRATORY_TRACT
  Filled 2023-12-17 (×3): qty 3

## 2023-12-17 MED ORDER — ALPRAZOLAM 0.25 MG PO TABS
0.2500 mg | ORAL_TABLET | Freq: Once | ORAL | Status: AC
  Administered 2023-12-17: 0.25 mg via ORAL
  Filled 2023-12-17: qty 1

## 2023-12-17 MED ORDER — PENTAFLUOROPROP-TETRAFLUOROETH EX AERO: 1.0000 | INHALATION_SPRAY | CUTANEOUS | Status: DC | PRN

## 2023-12-17 NOTE — TOC Initial Note (Addendum)
Transition of Care (TOC) - Initial/Assessment Note   Spoke to patient at bedside. Patient from home alone. Confirmed address and telephone number. Patient will need cab voucher home at discharge.   Patient does not have home oxygen.   Patient reported Dr Baxter Flattery office ordered a NEB machine for him , however everytime the company calls to deliver the NEB machine he is in the hospital. Patient unsure of the name of the company. Patient  consented for NCM to call Dr Baxter Flattery office to see which company NEB machine was ordered through and ask the company to deliver it here to him at the hospital. NCM called Dr Baxter Flattery nurse to call NCM back. Nurse called back. They have been leaving messages for patient but he has not called back. He has appointment 12/17/23. CHW aware patient is currently an inpatient at [facility] Per Erskine Squibb at [facility] patient has had multiple NEB machines ordered and lost them . They do not have a NEB machine there for him. If needed he will need another order and submit to insurance to see if insurance will cover another NEB machine . Ordered NEB with [facility] [employer], with Marthann Schiller they will check insurance.   Patient's appointment rescheduled for January 02, 2024 at 2:10pm  pm  Patient Details  Name: Johnny [name] MRN: [id number] Date of Birth: 1961-04-21  Transition of Care [facility] CM/SW Contact:    Kingsley Plan, RN Phone Number: 12/17/2023, 1:04 PM  Clinical Narrative:                   Expected Discharge Plan: Home/Self Care Barriers to Discharge: Continued Medical Work up   Patient Goals and CMS Choice Patient states their goals for this hospitalization and ongoing recovery are:: to return to home   Choice offered to / list presented to : NA      Expected Discharge Plan and Services In-house Referral: NA Discharge Planning Services: CM Consult   Living arrangements for the past 2 months: Apartment                 DME Arranged:  (see note)         HH Arranged:  NA          Prior Living Arrangements/Services Living arrangements for the past 2 months: Apartment Lives with:: Self Patient language and need for interpreter reviewed:: Yes Do you feel safe going back to the place where you live?: Yes      Need for Family Participation in Patient Care: No (Comment) Care giver support system in place?: Yes (comment)   Criminal Activity/Legal Involvement Pertinent to Current Situation/Hospitalization: No - Comment as needed  Activities of Daily Living   ADL Screening (condition at time of admission) Independently performs ADLs?: Yes (appropriate for developmental age) Is the patient deaf or have difficulty hearing?: No Does the patient have difficulty seeing, even when wearing glasses/contacts?: No Does the patient have difficulty concentrating, remembering, or making decisions?: No  Permission Sought/Granted   Permission granted to share information with : Yes, Verbal Permission Granted     Permission granted to share info w AGENCY: [facility]        Emotional Assessment Appearance:: Appears stated age Attitude/Demeanor/Rapport: Engaged Affect (typically observed): Appropriate Orientation: : Oriented to Self, Oriented to Place, Oriented to  Time, Oriented to Situation Alcohol / Substance Use: Not Applicable Psych Involvement: No (comment)  Admission diagnosis:  Encounter for blood transfusion [Z51.89] Symptomatic anemia [D64.9] Patient Active Problem List  Diagnosis Date Noted   Symptomatic anemia 12/16/2023   Hypertensive urgency 12/16/2023   Acute heart failure with preserved ejection fraction (HFpEF) (HCC) 11/29/2023   Anemia due to chronic kidney disease 11/29/2023   Fluid overload 09/20/2022   Group A streptococcal infection 06/05/2022   COPD exacerbation (HCC) 05/23/2022   AKI (acute kidney injury) (HCC) 11/29/2021   Toenail fungus 06/11/2019   Homelessness 06/11/2019   Other atopic dermatitis  06/11/2019   Hypothyroidism 06/11/2019   Eczema of hand 11/10/2018   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 ([place]), [place] - [address] [place] ([place]) [place] [address] Phone: [phone] Fax: [phone]  [facility] [address] [place] [address] Phone: [phone] Fax: [phone]     Social Drivers of Health (SDOH) Social History: SDOH Screenings   Food Insecurity: No Food Insecurity (12/16/2023)  Housing: Low Risk  (12/16/2023)  Transportation Needs: Unmet Transportation Needs (12/16/2023)  Utilities: Not At Risk (12/16/2023)  Alcohol Screen: Low Risk  (11/20/2023)  Depression (PHQ2-9): Low Risk  (02/06/2023)  Financial Resource Strain: Low Risk  (11/20/2023)  Social Connections: Moderately Integrated (11/20/2023)  Stress: Stress Concern Present (11/20/2023)  Tobacco Use: Medium Risk (12/16/2023)  Health Literacy: Adequate Health Literacy (11/20/2023)   SDOH Interventions: Transportation Interventions: Inpatient TOC   Readmission Risk Interventions    09/25/2022    1:43 PM  Readmission Risk Prevention Plan  Transportation Screening Complete  Medication Review (RN Care Manager) Complete  PCP or Specialist appointment within 3-5 days of discharge Complete  HRI or Home Care Consult Complete  SW Recovery Care/Counseling Consult --  Palliative Care Screening Not Applicable  Skilled  Nursing Facility Not Applicable

## 2023-12-17 NOTE — Telephone Encounter (Signed)
 Copied from CRM [id number]. Topic: Clinical - Prescription Issue >> Dec 17, 2023  1:02 PM Patsy Lager T wrote: Reason for CRM: Herbert Seta Nurse Case Manager from [facility] called to find out where the Nebulizer device was ordered from as they have been trying to deliver to patient at home but he has been in the hospital every time they've tried. Please f/u with Heather at [phone] so they can get the device delivered to the hospital before he leaves

## 2023-12-17 NOTE — Progress Notes (Signed)
Received patient in bed to unit.  Alert and oriented.  Informed consent signed and in chart.   TX duration: 3.5 hours  Patient tolerated well. During session put 1L oxygen Nasal Canula for comfort per patient's request. Transported back to the room  Alert, without acute distress.  Hand-off given to patient's nurse.   Access used: Left Fistula Upper ARm Access issues: none  Total UF removed: 2L Medication(s) given: none   12/17/23 1858  Vitals  Temp 98.9 F (37.2 C)  Temp Source Oral  BP (!) 176/98  MAP (mmHg) 120  Pulse Rate 81  ECG Heart Rate 81  Resp (!) 21  Oxygen Therapy  SpO2 100 %  O2 Device Room Air  During Treatment Monitoring  Duration of HD Treatment -hour(s) 3.5 hour(s)  HD Safety Checks Performed Yes  Intra-Hemodialysis Comments Tx completed;Tolerated well  Dialysis Fluid Bolus Normal Saline  Bolus Amount (mL) 300 mL  Post Treatment  Dialyzer Clearance Lightly streaked  Liters Processed 73.5  Fluid Removed (mL) 2000 mL  Tolerated HD Treatment Yes  AVG/AVF Arterial Site Held (minutes) 7 minutes  AVG/AVF Venous Site Held (minutes) 7 minutes  Fistula / Graft Left Upper arm Arteriovenous fistula  Placement Date/Time: 06/07/16 0413   Placed prior to admission: Yes  Orientation: Left  Access Location: Upper arm  Access Type: Arteriovenous fistula  Status Deaccessed     Stacie Glaze LPN [facility]

## 2023-12-17 NOTE — Consult Note (Addendum)
[facility] Renal Consultation Note    Indication for Consultation:  Management of ESRD/hemodialysis; anemia, hypertension/volume and secondary hyperparathyroidism  ZOX:WRUEAV, Enobong, MD  HPI: Johnny [name] is a 63 y.o. 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 Patient was previously admitted 11/27/22-12/03/23 for volume overload with progression to ESRD. HD was started on 11/29/23 and he then started outpatient hemodialysis MWF at [facility]. He has a aneurysmal, but functional, L AVF that was placed around 20 years ago. Patient presents to the ED for symptomatic anemia.  Reviewed outpatient hemodialysis records. Noted his Hgb from 2/8 was 6.1. Patient reported feeling SOB for the past 3 days which prompted him to come into the ED for further evaluation. Patient received 2 units PRBCs yesterday for Hgb of 6. Hgb trended up to 7.9 but currently at 7.5. Noted occult stool is negative. Seen and examined patient at bedside this morning. He reports feeling short-winded but alittle better after getting the transfusion yesterday. Currently on O2 via . CXR suggested CHF/pulmonary edema. Renal labs not terrible this AM. He denies CP, palpitations, dizziness, and N/V. Plan for HD today per his usual schedule.   Past Medical History:  Diagnosis Date   Adenomatous colon polyp    tubular   Anemia    Asthma    as a child   CHF (congestive heart failure) (HCC)    Chronic kidney disease    COPD (chronic obstructive pulmonary disease) (HCC)    Diverticulosis    External otitis of right ear 11/10/2018   Gout    Hepatitis C    HLD (hyperlipidemia)    Hypertension    OSA (obstructive sleep apnea) 11/03/2015   Sleep apnea    Strep throat 01/2021   Thyroid disorder    Past Surgical History:  Procedure Laterality Date   A/V FISTULAGRAM N/A 11/08/2023   Procedure: A/V  Fistulagram;  Surgeon: Leonie Douglas, MD;  Location: [facility];  Service: Vascular;  Laterality: N/A;   AV FISTULA PLACEMENT  09/12/2009   Left arm AVF   Family History  Problem Relation Age of Onset   Hypertension Mother    Diabetes Mother    Heart disease Mother    Alzheimer's disease Mother    Arthritis Mother    Anemia Father    Kidney disease Brother    Colon cancer Neg Hx    Colon polyps Neg Hx    Esophageal cancer Neg Hx    Rectal cancer Neg Hx    Stomach cancer Neg Hx    Social History:  reports that he quit smoking about 12 years ago. His smoking use included cigarettes. He has never used smokeless tobacco. He reports that he does not drink alcohol and does not use drugs. Allergies  Allergen Reactions   Heparin Other (See Comments)    Pt reports he's not sure what type of reaction   Naproxen Other (See Comments)    Unknown reaction    Shellfish Allergy Itching and Swelling   Prior to Admission medications   Medication Sig Start Date End Date Taking? Authorizing Provider  albuterol (VENTOLIN HFA) 108 (90 Base) MCG/ACT inhaler Inhale 2 puffs into the lungs every 6 (six) hours as needed for wheezing or shortness of breath. Patient taking differently: Inhale 1 puff into the lungs every 6 (six) hours as needed for wheezing or shortness of breath.  12/01/21  Yes Dorcas Carrow, MD  atorvastatin (LIPITOR) 40 MG tablet Take 1 tablet (40 mg total) by mouth daily. 11/20/23  Yes McClung, Marzella Schlein, PA-C  colchicine 0.6 MG tablet TAKE 2 TABLETS BY MOUTH AT THE ONSET OF A GOUT FLARE. MAY REPEAT 1 TABLET IN 1 HOUR IF SYMPTOMS PERSIST 10/15/23  Yes Hoy Register, MD  labetalol (NORMODYNE) 300 MG tablet Take 1 tablet by mouth twice daily 12/09/23  Yes Newlin, Enobong, MD  levothyroxine (SYNTHROID) 100 MCG tablet Take 100 mcg by mouth daily. 10/15/23  Yes [provider]  mometasone-formoterol (DULERA) 100-5 MCG/ACT AERO Inhale 2 puffs into the lungs 2 (two) times daily.  11/07/22  Yes Anders Simmonds, PA-C  mupirocin ointment (BACTROBAN) 2 % Apply 1 Application topically 2 (two) times daily. Patient taking differently: Apply 1 Application topically as needed (rash on hand). 11/07/22  Yes Anders Simmonds, PA-C  sodium bicarbonate 650 MG tablet Take 650 mg by mouth 2 (two) times daily. 09/06/23  Yes [provider]  torsemide (DEMADEX) 100 MG tablet Take 1 tablet (100 mg total) by mouth daily. 11/07/22 12/17/23 Yes McClung, Marzella Schlein, PA-C  triamcinolone cream (KENALOG) 0.1 % APPLY CREAM EXTERNALLY TWICE DAILY Patient taking differently: Apply 1 Application topically as needed (rash on hand). 10/23/23  Yes Hoy Register, MD  allopurinol (ZYLOPRIM) 100 MG tablet Take 1 tablet (100 mg total) by mouth daily. Patient not taking: Reported on 12/17/2023 02/06/23   Hoy Register, MD  amLODipine (NORVASC) 10 MG tablet Take 10 mg by mouth daily. Patient not taking: Reported on 12/17/2023    [provider]  ipratropium-albuterol (DUONEB) 0.5-2.5 (3) MG/3ML SOLN Inhale 3 mLs into the lungs every 6 (six) hours as needed. Patient not taking: Reported on 12/17/2023 11/27/23   Hoy Register, MD   Current Facility-Administered Medications  Medication Dose Route Frequency Provider Last Rate Last Admin   acetaminophen (TYLENOL) tablet 650 mg  650 mg Oral Q6H PRN Clydie Braun, MD       Or   acetaminophen (TYLENOL) suppository 650 mg  650 mg Rectal Q6H PRN Madelyn Flavors A, MD       albuterol (PROVENTIL) (2.5 MG/3ML) 0.083% nebulizer solution 2.5 mg  2.5 mg Nebulization Q6H PRN Madelyn Flavors A, MD   2.5 mg at 12/16/23 2352   allopurinol (ZYLOPRIM) tablet 100 mg  100 mg Oral Daily Smith, Rondell A, MD   100 mg at 12/17/23 0913   alteplase (CATHFLO ACTIVASE) injection 2 mg  2 mg Intracatheter Once PRN Berenda Morale, NP       atorvastatin (LIPITOR) tablet 40 mg  40 mg Oral Daily Smith, Rondell A, MD   40 mg at 12/17/23 0912   Chlorhexidine Gluconate Cloth 2 %  PADS 6 each  6 each Topical Q0600 Berenda Morale, NP   6 each at 12/17/23 0454   hydrALAZINE (APRESOLINE) injection 10 mg  10 mg Intravenous Q4H PRN Smith, Rondell A, MD       ipratropium-albuterol (DUONEB) 0.5-2.5 (3) MG/3ML nebulizer solution 3 mL  3 mL Nebulization TID Pokhrel, Laxman, MD       labetalol (NORMODYNE) tablet 300 mg  300 mg Oral BID Katrinka Blazing, Rondell A, MD   300 mg at 12/17/23 0913   levothyroxine (SYNTHROID) tablet 100 mcg  100 mcg Oral QAC breakfast Madelyn Flavors A, MD   100 mcg at 12/17/23 0603   lidocaine (PF) (XYLOCAINE) 1 % injection 5 mL  5 mL Intradermal PRN Salome Holmes  E, NP       lidocaine-prilocaine (EMLA) cream 1 Application  1 Application Topical PRN Berenda Morale, NP       mometasone-formoterol (DULERA) 100-5 MCG/ACT inhaler 2 puff  2 puff Inhalation BID Madelyn Flavors A, MD   2 puff at 12/17/23 0815   pentafluoroprop-tetrafluoroeth (GEBAUERS) aerosol 1 Application  1 Application Topical PRN Berenda Morale, NP       sodium chloride flush (NS) 0.9 % injection 3 mL  3 mL Intravenous Q12H Katrinka Blazing, Rondell A, MD   3 mL at 12/17/23 0917   torsemide (DEMADEX) tablet 100 mg  100 mg Oral Daily Madelyn Flavors A, MD   100 mg at 12/17/23 0913   Labs: Basic Metabolic Panel: Recent Labs  Lab 12/16/23 1122 12/17/23 0603 12/17/23 1129  NA 136 137 134*  K 3.5 3.8 3.7  CL 96* 98 95*  CO2 25 26 25   GLUCOSE 115* 110* 101*  BUN 41* 50* 52*  CREATININE 7.83* 9.06* 9.15*  CALCIUM 8.0* 8.5* 8.3*  PHOS  --  4.3 4.3   Liver Function Tests: Recent Labs  Lab 12/17/23 0603 12/17/23 1129  ALBUMIN 3.1* 2.9*   No results for input(s): "LIPASE", "AMYLASE" in the last 168 hours. No results for input(s): "AMMONIA" in the last 168 hours. CBC: Recent Labs  Lab 12/16/23 1122 12/17/23 0603 12/17/23 1129  WBC 9.2 10.9* 10.3  HGB 6.0* 7.9* 7.5*  HCT 18.4* 23.8* 22.2*  MCV 74.5* 76.5* 75.8*  PLT 133* 137* 132*   Cardiac Enzymes: No results for input(s):  "CKTOTAL", "CKMB", "CKMBINDEX", "TROPONINI" in the last 168 hours. CBG: No results for input(s): "GLUCAP" in the last 168 hours. Iron Studies: No results for input(s): "IRON", "TIBC", "TRANSFERRIN", "FERRITIN" in the last 72 hours. Studies/Results: DG Chest 2 View Result Date: 12/16/2023 CLINICAL DATA:  Shortness of breath.  Cough. EXAM: CHEST - 2 VIEW COMPARISON:  12/01/2023. FINDINGS: There are increased bronchovascular markings with cephalization of the upper lung zone vessels. Findings favor congestive heart failure/pulmonary edema, more pronounced than the recent prior exam from 12/01/2023. Bilateral lung fields are otherwise clear. No acute consolidation or lung collapse. There is subtle blunting of bilateral posterior costophrenic angles, suggesting trace pleural effusions. Stable mildly enlarged cardio-mediastinal silhouette. No acute osseous abnormalities. The soft tissues are within normal limits. IMPRESSION: *Findings favor congestive heart failure/pulmonary edema, more pronounced than the recent prior exam from 12/01/2023. Electronically Signed   By: Jules Schick M.D.   On: 12/16/2023 13:27    ROS: All others negative except those listed in HPI   Physical Exam: Vitals:   12/17/23 0411 12/17/23 0814 12/17/23 0815 12/17/23 0820  BP: (!) 153/96   (!) 158/100  Pulse: 81   71  Resp: 18   18  Temp: 97.6 F (36.4 C)     TempSrc: Oral     SpO2: 100% 99% 100% 100%     General: Awake, alert, NAD Head: Sclera not icteric MMM Lungs: Auscultated fine rales lower bases; No wheeze or rhonchi. Breathing is unlabored. Heart: RRR. No murmur, rubs or gallops.  Abdomen: soft and non-tender  Lower extremities: no LE edema Neuro: AAOx3. Moves all extremities spontaneously. Dialysis Access: L AVF (aneurysmal) (+) B/T  Dialysis Orders:  TTS - [facility] 4hrs, BFR 300, DFR 500,  EDW 79.1kg, 2K/ 2Ca Heparin None ordered Mircera 75 mcg q2wks - last 2/8 On Fe load (last  dose to be given 12/17/23)  Assessment/Plan: Symptomatic anemia - Hgb 6.1  from 2/8 at his outpatient HD center. Hgb 6 at admit. S/p 2 units PRBCs. Occult stool (-). Hgb now 7.5. ESA just given on 2/8. Plan to raise dose at discharge. Monitor trend. ESRD -  on HD TTS. HD today per his usual schedule. Hypertension/volume  - CXR suggest CHF/pulmonary edema. Blood pressures elevated. Push UF as tolerated for now.  Anemia of CKD - See above Secondary Hyperparathyroidism - CorrCa and phos okay.  Nutrition - Renal diet with fluid restriction Dispo - Okay for discharge from renal standpoint if patient feels okay after today's treatment. Plan to adjust ESA in outpatient.  Salome Holmes, NP [facility] 12/17/2023, 12:46 PM

## 2023-12-17 NOTE — Hospital Course (Addendum)
Johnny [name] is a 63 y.o. male with medical history significant of hypertension, hyperlipidemia, history of end stage renal disease on hemodialysis, heart failure with preserved ejection pressure, COPD, hypothyroidism, obstructive sleep apnea, gout presented to hospital with fatigue and shortness of breath for the last 3 days with cough but no fever or chills., He recently started dialysis during last hospitalization from 1/23-1/24 after having a fistula in place for nearly 20 years prior to being started on hemodialysis.  Received 2 units of packed RBC at that time he was currently on Tuesday, Thursday, and Saturday schedule of hemodialysis..  In the ED, patient was afebrile but tachypneic with elevated blood pressure.  Labs were significant for hemoglobin of 6.0.  Platelet 137. Stool guaiac was noted to be negative.  Chest x-ray revealed findings favoring congestive heart failure with pulmonary edema more pronounced than prior exam from 1/26.  Patient was given 2 units of packed RBC and was admitted to the  hospital for further evaluation and treatment.  Symptomatic anemia With fatigue and weakness.  Hemoglobin was 6.0 on presentation but is still guaiac negative.  Received 2 units of packed RBC.    Hypoxia and dyspnea and shortness of breath.  Likely secondary to fluid overload.  Received units of packed RBC.  Will be going for hemodialysis today.  Will continue to wean oxygen as able.  ESRD on HD Recently started on dialysis.  Will receive hemodialysis again today.  Hypertensive urgency Blood pressure elevated up to 178/108.  Continue labetalol and torsemide.  COPD -Continue Dulera, DuoNebs    Hyperlipidemia Continue atorvastatin   Hypothyroidism TSH 4.740 when checked on 11/19/2022.  Continue Synthroid.   Gout  -Continue allopurinol

## 2023-12-17 NOTE — Progress Notes (Addendum)
PROGRESS NOTE  Johnny [name] IRS:[id number] DOB: [birth year] DOA: 12/16/2023 PCP: Hoy Register, MD   LOS: 0 days   Brief narrative:   Johnny [name] is a 63 y.o. male with medical history significant of hypertension, hyperlipidemia, history of end stage renal disease on hemodialysis, heart failure with preserved ejection pressure, COPD, hypothyroidism, obstructive sleep apnea, gout presented to hospital with fatigue and shortness of breath for the last 3 days with cough but no fever or chills., He recently started dialysis during last hospitalization from 1/23-1/24 after having a fistula in place for nearly 20 years prior to being started on hemodialysis.  Received 2 units of packed RBC at that time he was currently on Tuesday, Thursday, and Saturday schedule of hemodialysis..  In the ED, patient was afebrile but tachypneic with elevated blood pressure.  Labs were significant for hemoglobin of 6.0.  Platelet 137. Stool guaiac was noted to be negative.  Chest x-ray revealed findings favoring congestive heart failure with pulmonary edema more pronounced than prior exam from 1/26.  Patient was given 2 units of packed RBC and was admitted to the  hospital for further evaluation and treatment.   Assessment/Plan: Principal Problem:   Symptomatic anemia Active Problems:   ESRD on dialysis (HCC)   Hypertensive urgency   COPD (chronic obstructive pulmonary disease) (HCC)   Hyperlipidemia   Hypothyroidism   Gout  Symptomatic anemia With fatigue and weakness.  Hemoglobin was 6.0 on presentation but is still guaiac negative.  Received 2 units of packed RBC. Still complains of Breath and dyspnea.  Hypoxia , dyspnea and shortness of breath.  Likely secondary to fluid overload.  Received 3 units of packed RBC.  Will be going for hemodialysis today.  Will continue to wean oxygen as able.  ESRD on HD Recently started on dialysis.  Will receive hemodialysis again today.  Hypertensive urgency Blood  pressure elevated up to 178/108.  Continue labetalol and torsemide.  COPD -Continue Dulera, DuoNebs    Hyperlipidemia Continue atorvastatin   Hypothyroidism TSH 4.740 when checked on 11/19/2022.  Continue Synthroid.   Gout  -Continue allopurinol  DVT prophylaxis: SCDs Start: 12/16/23 1338   Disposition: Likely home in 1 to 2 days.  Status is: Inpatient Remains inpatient appropriate because: Hypoxia, volume overload, symptomatic anemia, pending clinical improvement,    Code Status:     Code Status: Full Code  Family Communication: None at bedside  Consultants: Nephrology  Procedures: Hemodialysis PRBC transfusion  Anti-infectives:  None  Anti-infectives (From admission, onward)    None        Subjective: Today, patient was seen and examined at bedside.  Patient complains of shortness of breath dyspnea congestion.  Denies any chest pain, nausea or vomiting.  Feels anxious  Objective: Vitals:   12/17/23 1517 12/17/23 1530  BP: (!) 173/99 (!) 174/99  Pulse: 87 85  Resp: (!) 35 (!) 36  Temp:    SpO2: 99% 96%    Intake/Output Summary (Last 24 hours) at 12/17/2023 1535 Last data filed at 12/17/2023 1300 Gross per 24 hour  Intake 1281.5 ml  Output 200 ml  Net 1081.5 ml   Filed Weights   12/17/23 1507  Weight: 82.3 kg   Body mass index is 24.61 kg/m.   Physical Exam: GENERAL: Patient is alert awake and oriented.  Mildly anxious, on nasal cannula oxygen HENT: No scleral pallor or icterus. Pupils equally reactive to light. Oral mucosa is moist NECK: is supple, no gross swelling noted. CHEST: .  Diminished breath sounds bilaterally. CVS: S1 and S2 heard, no murmur. Regular rate and rhythm.  ABDOMEN: Soft, non-tender, bowel sounds are present. EXTREMITIES: No edema. CNS: Cranial nerves are intact. No focal motor deficits. SKIN: warm and dry without rashes.  Data Review: I have personally reviewed the following laboratory data and  studies,  CBC: Recent Labs  Lab 12/16/23 1122 12/17/23 0603 12/17/23 1129  WBC 9.2 10.9* 10.3  HGB 6.0* 7.9* 7.5*  HCT 18.4* 23.8* 22.2*  MCV 74.5* 76.5* 75.8*  PLT 133* 137* 132*   Basic Metabolic Panel: Recent Labs  Lab 12/16/23 1122 12/17/23 0603 12/17/23 1129  NA 136 137 134*  K 3.5 3.8 3.7  CL 96* 98 95*  CO2 25 26 25   GLUCOSE 115* 110* 101*  BUN 41* 50* 52*  CREATININE 7.83* 9.06* 9.15*  CALCIUM 8.0* 8.5* 8.3*  PHOS  --  4.3 4.3   Liver Function Tests: Recent Labs  Lab 12/17/23 0603 12/17/23 1129  ALBUMIN 3.1* 2.9*   No results for input(s): "LIPASE", "AMYLASE" in the last 168 hours. No results for input(s): "AMMONIA" in the last 168 hours. Cardiac Enzymes: No results for input(s): "CKTOTAL", "CKMB", "CKMBINDEX", "TROPONINI" in the last 168 hours. BNP (last 3 results) Recent Labs    11/28/23 2351  BNP 2,733.3*    ProBNP (last 3 results) No results for input(s): "PROBNP" in the last 8760 hours.  CBG: No results for input(s): "GLUCAP" in the last 168 hours. Recent Results (from the past 240 hours)  Resp panel by RT-PCR (RSV, Flu A&B, Covid) Anterior Nasal Swab     Status: None   Collection Time: 12/16/23  1:53 PM   Specimen: Anterior Nasal Swab  Result Value Ref Range Status   SARS Coronavirus 2 by RT PCR NEGATIVE NEGATIVE Final   Influenza A by PCR NEGATIVE NEGATIVE Final   Influenza B by PCR NEGATIVE NEGATIVE Final    Comment: (NOTE) The Xpert Xpress SARS-CoV-2/FLU/RSV plus assay is intended as an aid in the diagnosis of influenza from Nasopharyngeal swab specimens and should not be used as a sole basis for treatment. Nasal washings and aspirates are unacceptable for Xpert Xpress SARS-CoV-2/FLU/RSV testing.  Fact Sheet for Patients: BloggerCourse.com  Fact Sheet for Healthcare Providers: SeriousBroker.it  This test is not yet approved or cleared by the [place] FDA and has been  authorized for detection and/or diagnosis of SARS-CoV-2 by FDA under an Emergency Use Authorization (EUA). This EUA will remain in effect (meaning this test can be used) for the duration of the COVID-19 declaration under Section 564(b)(1) of the Act, 21 U.S.C. section 360bbb-3(b)(1), unless the authorization is terminated or revoked.     Resp Syncytial Virus by PCR NEGATIVE NEGATIVE Final    Comment: (NOTE) Fact Sheet for Patients: BloggerCourse.com  Fact Sheet for Healthcare Providers: SeriousBroker.it  This test is not yet approved or cleared by the [place] FDA and has been authorized for detection and/or diagnosis of SARS-CoV-2 by FDA under an Emergency Use Authorization (EUA). This EUA will remain in effect (meaning this test can be used) for the duration of the COVID-19 declaration under Section 564(b)(1) of the Act, 21 U.S.C. section 360bbb-3(b)(1), unless the authorization is terminated or revoked.  Performed at [facility], [address] [place], [place] [address]      Studies: DG Chest 2 View Result Date: 12/16/2023 CLINICAL DATA:  Shortness of breath.  Cough. EXAM: CHEST - 2 VIEW COMPARISON:  12/01/2023. FINDINGS: There are increased bronchovascular  markings with cephalization of the upper lung zone vessels. Findings favor congestive heart failure/pulmonary edema, more pronounced than the recent prior exam from 12/01/2023. Bilateral lung fields are otherwise clear. No acute consolidation or lung collapse. There is subtle blunting of bilateral posterior costophrenic angles, suggesting trace pleural effusions. Stable mildly enlarged cardio-mediastinal silhouette. No acute osseous abnormalities. The soft tissues are within normal limits. IMPRESSION: *Findings favor congestive heart failure/pulmonary edema, more pronounced than the recent prior exam from 12/01/2023. Electronically Signed   By: Jules Schick M.D.    On: 12/16/2023 13:27      Johnny Das, MD  [facility] 12/17/2023  If 7PM-7AM, please contact night-coverage

## 2023-12-17 NOTE — Plan of Care (Signed)
Problem: Activity: Goal: Risk for activity intolerance will decrease Outcome: Progressing   Problem: Nutrition: Goal: Adequate nutrition will be maintained Outcome: Progressing   Problem: Elimination: Goal: Will not experience complications related to bowel motility Outcome: Progressing   Problem: Pain Managment: Goal: General experience of comfort will improve and/or be controlled Outcome: Progressing   Problem: Safety: Goal: Ability to remain free from injury will improve Outcome: Progressing   Problem: Skin Integrity: Goal: Risk for impaired skin integrity will decrease Outcome: Progressing

## 2023-12-17 NOTE — Telephone Encounter (Signed)
Called Heather on phone number listed.  Informed that his nebulizer machine was not at [facility]. Informed that he may have had it sent as a DME  at some point to [facility].   Herbert Seta stated she would have the hospitalist send over a nebulizer machine.   Advised that he may or may not qualify if he had lost them previously.    Rescheduled his HFU for 01/02/2024 at 2:10 pm.

## 2023-12-17 NOTE — Progress Notes (Signed)
Pt receives out-pt HD at [facility] on TTS with 6:00 am chair time. Will assist as needed.   Olivia Canter Renal Navigator [phone]

## 2023-12-18 ENCOUNTER — Other Ambulatory Visit (HOSPITAL_COMMUNITY): Payer: Self-pay | Admitting: Internal Medicine

## 2023-12-18 ENCOUNTER — Inpatient Hospital Stay: Payer: MEDICAID | Admitting: Physician Assistant

## 2023-12-18 ENCOUNTER — Encounter (HOSPITAL_COMMUNITY): Payer: Self-pay

## 2023-12-18 ENCOUNTER — Other Ambulatory Visit (HOSPITAL_COMMUNITY): Payer: Self-pay

## 2023-12-18 DIAGNOSIS — D649 Anemia, unspecified: Secondary | ICD-10-CM | POA: Diagnosis not present

## 2023-12-18 LAB — HEPATITIS B SURFACE ANTIBODY, QUANTITATIVE: Hep B S AB Quant (Post): 4.8 m[IU]/mL — ABNORMAL LOW

## 2023-12-18 LAB — RENAL FUNCTION PANEL
Albumin: 3 g/dL — ABNORMAL LOW (ref 3.5–5.0)
Anion gap: 12 (ref 5–15)
BUN: 36 mg/dL — ABNORMAL HIGH (ref 6–20)
CO2: 26 mmol/L (ref 22–32)
Calcium: 8.4 mg/dL — ABNORMAL LOW (ref 8.9–10.3)
Chloride: 96 mmol/L — ABNORMAL LOW (ref 98–111)
Creatinine, Ser: 6.29 mg/dL — ABNORMAL HIGH (ref 0.61–1.24)
GFR, Estimated: 9 mL/min — ABNORMAL LOW (ref >60)
Glucose, Bld: 98 mg/dL (ref 70–99)
Phosphorus: 3.3 mg/dL (ref 2.5–4.6)
Potassium: 3.6 mmol/L (ref 3.5–5.1)
Sodium: 134 mmol/L — ABNORMAL LOW (ref 135–145)

## 2023-12-18 MED ORDER — AMLODIPINE BESYLATE 10 MG PO TABS: 10.0000 mg | ORAL_TABLET | Freq: Every day | ORAL | Status: DC

## 2023-12-18 MED ORDER — GUAIFENESIN-DM 100-10 MG/5ML PO SYRP
5.0000 mL | ORAL_SOLUTION | ORAL | Status: DC | PRN
Start: 2023-12-18 — End: 2023-12-18
  Administered 2023-12-18: 5 mL via ORAL
  Filled 2023-12-18: qty 5

## 2023-12-18 MED ORDER — ALLOPURINOL 100 MG PO TABS
100.0000 mg | ORAL_TABLET | Freq: Every day | ORAL | 1 refills | Status: DC
  Filled 2023-12-18: qty 90, 90d supply, fill #0

## 2023-12-18 MED ORDER — IPRATROPIUM-ALBUTEROL 0.5-2.5 (3) MG/3ML IN SOLN
3.0000 mL | Freq: Four times a day (QID) | RESPIRATORY_TRACT | 3 refills | Status: AC | PRN
  Filled 2023-12-18 (×2): qty 90, 8d supply, fill #0

## 2023-12-18 MED ORDER — TORSEMIDE 100 MG PO TABS
100.0000 mg | ORAL_TABLET | Freq: Every day | ORAL | 0 refills | Status: DC
  Filled 2023-12-18: qty 30, 30d supply, fill #0

## 2023-12-18 NOTE — Progress Notes (Signed)
D/C order noted. Contacted [facility] to be advised of pt's d/c today and that pt should resume care tomorrow.   Olivia Canter Renal Navigator [phone]

## 2023-12-18 NOTE — Progress Notes (Signed)
[facility] Progress Note   Subjective:    Seen and examined patient at bedside. He reports feeling better after HD yesterday. Noted net UF 2L. Discussed with Dr. Marisue Humble and Hospitalist. Okay for discharge from a renal standpoint. Plan to restart Amlodipine 10mg  at bedtime and will raise his ESA dose for outpatient dialysis.  Objective Vitals:   12/17/23 2029 12/17/23 2042 12/18/23 0451 12/18/23 0821  BP: (!) 164/99  (!) 155/85 (!) 156/98  Pulse: 87  81 83  Resp: 20   18  Temp: 98.2 F (36.8 C)  98.3 F (36.8 C) 97.9 F (36.6 C)  TempSrc: Oral  Oral Oral  SpO2: 97% 98% 99% 99%  Weight:       Physical Exam General: Awake, alert, NAD, on RA Head: Sclera not icteric MMM Lungs: Clear bilaterally; No wheeze or rhonchi. Breathing is unlabored. Heart: RRR. No murmur, rubs or gallops.  Abdomen: soft and non-tender  Lower extremities: no LE edema Neuro: AAOx3. Moves all extremities spontaneously. Dialysis Access: L AVF (aneurysmal) (+) B/T  Filed Weights   12/17/23 1507 12/17/23 1934  Weight: 82.3 kg 80.2 kg    Intake/Output Summary (Last 24 hours) at 12/18/2023 1136 Last data filed at 12/18/2023 0900 Gross per 24 hour  Intake 240 ml  Output 2000 ml  Net -1760 ml    Additional Objective Labs: Basic Metabolic Panel: Recent Labs  Lab 12/17/23 0603 12/17/23 1129 12/18/23 0630  NA 137 134* 134*  K 3.8 3.7 3.6  CL 98 95* 96*  CO2 26 25 26   GLUCOSE 110* 101* 98  BUN 50* 52* 36*  CREATININE 9.06* 9.15* 6.29*  CALCIUM 8.5* 8.3* 8.4*  PHOS 4.3 4.3 3.3   Liver Function Tests: Recent Labs  Lab 12/17/23 0603 12/17/23 1129 12/18/23 0630  ALBUMIN 3.1* 2.9* 3.0*   No results for input(s): "LIPASE", "AMYLASE" in the last 168 hours. CBC: Recent Labs  Lab 12/16/23 1122 12/17/23 0603 12/17/23 1129  WBC 9.2 10.9* 10.3  HGB 6.0* 7.9* 7.5*  HCT 18.4* 23.8* 22.2*  MCV 74.5* 76.5* 75.8*  PLT 133* 137* 132*   Blood Culture    Component Value Date/Time    SDES BLOOD RIGHT FOREARM 06/05/2022 2355   SPECREQUEST  06/05/2022 2355    BOTTLES DRAWN AEROBIC AND ANAEROBIC Blood Culture results may not be optimal due to an inadequate volume of blood received in culture bottles   CULT  06/05/2022 2355    NO GROWTH 5 DAYS Performed at [facility], [address], [place], [place] [address]    REPTSTATUS 06/11/2022 FINAL 06/05/2022 2355    Cardiac Enzymes: No results for input(s): "CKTOTAL", "CKMB", "CKMBINDEX", "TROPONINI" in the last 168 hours. CBG: No results for input(s): "GLUCAP" in the last 168 hours. Iron Studies: No results for input(s): "IRON", "TIBC", "TRANSFERRIN", "FERRITIN" in the last 72 hours. Lab Results  Component Value Date   INR 1.03 10/25/2015   INR 1.0 02/21/2009   Studies/Results: No results found.  Medications:   allopurinol  100 mg Oral Daily   amLODipine  10 mg Oral QHS   atorvastatin  40 mg Oral Daily   Chlorhexidine Gluconate Cloth  6 each Topical Q0600   ipratropium-albuterol  3 mL Nebulization TID   labetalol  300 mg Oral BID   levothyroxine  100 mcg Oral QAC breakfast   mometasone-formoterol  2 puff Inhalation BID   sodium chloride flush  3 mL Intravenous Q12H   torsemide  100 mg Oral Daily  Dialysis Orders: TTS - [facility] 4hrs, BFR 300, DFR 500,  EDW 79.1kg, 2K/ 2Ca Heparin None ordered Mircera 75 mcg q2wks - last 2/8 On Fe load (last dose to be given 12/17/23)  Assessment/Plan: Symptomatic anemia - Hgb 6.1 from 2/8 at his outpatient HD center. Hgb 6 at admit. S/p 2 units PRBCs. Occult stool (-). Hgb now 7.5. ESA just given on 2/8. Plan to raise dose at discharge in the outpatient setting. Monitor trend. ESRD -  on HD TTS. Next HD 2/13 in outpatient. Hypertension/volume  - CXR suggest Anemia of CKD - See above Secondary Hyperparathyroidism - CorrCa and phos okay.  Nutrition - Renal diet with fluid restriction Dispo - Okay for discharge from renal standpoint. Plan to  adjust his ESA in outpatient and restart his Amlodipine to help with his BP.  Salome Holmes, NP  [facility] 12/18/2023,11:36 AM  LOS: 1 day

## 2023-12-18 NOTE — Discharge Summary (Signed)
Physician Discharge Summary  [name] YNW:[id number] DOB: October 24, 1961 DOA: 12/16/2023  PCP: Hoy Register, MD  Admit date: 12/16/2023 Discharge date: 12/18/2023  Admitted From: Home  Discharge disposition: Home  Recommendations for Outpatient Follow-Up:   Follow up with your primary care provider in one week.  Check CBC, BMP, magnesium in the next visit Please consider sleep study as outpatient to assess for sleep apnea.  Discharge Diagnosis:   Principal Problem:   Symptomatic anemia Active Problems:   ESRD on dialysis [facility])   Hypertensive urgency   COPD (chronic obstructive pulmonary disease) (HCC)   Hyperlipidemia   Hypothyroidism   Gout   Discharge Condition: Improved.  Diet recommendation: Low sodium, heart healthy.   Wound care: None.  Code status: Full.   History of Present Illness:   Johnny [name] is a 63 y.o. male with medical history significant of hypertension, hyperlipidemia, history of end stage renal disease on hemodialysis, heart failure with preserved ejection pressure, COPD, hypothyroidism, obstructive sleep apnea, gout presented to hospital with fatigue and shortness of breath for the last 3 days with cough but no fever or chills., He recently started dialysis during last hospitalization from 1/23-1/24 after having a fistula in place for nearly 20 years prior to being started on hemodialysis.  Received 2 units of packed RBC at that time he was currently on Tuesday, Thursday, and Saturday schedule of hemodialysis..  In the ED, patient was afebrile but tachypneic with elevated blood pressure.  Labs were significant for hemoglobin of 6.0.  Platelet 137. Stool guaiac was noted to be negative.  Chest x-ray revealed findings favoring congestive heart failure with pulmonary edema more pronounced than prior exam from 1/26.  Patient was given 2 units of packed RBC and was admitted to the  hospital for further evaluation and treatment.    Hospital Course:    Following conditions were addressed during hospitalization as listed below,  Symptomatic anemia With fatigue and weakness.  Hemoglobin was 6.0 on presentation but stool guaiac negative.  Received 2 units of packed RBC.  Still complains of mild dyspnea on lying down but okay with ambulation.   Hypoxia , dyspnea and shortness of breath.   Chronic diastolic heart failure.   Likely secondary to fluid overload.  Received 2 units of packed RBC.  Hemodialysis x 2 in the hospital.  At this time, patient has been seen by nephrology and okay for disposition home.  Patient will need to get ESA as outpatient.  Received first dose on 12/14/23.  Patient was also advised to follow-up with his primary care provider to discuss about obstructive sleep apnea assessment as outpatient.  ESRD on HD Recently started on dialysis.  Hemodialysis during hospitalization.   Hypertensive urgency  Continue labetalol and torsemide.  Will resume amlodipine on discharge.   COPD -Continue Dulera, DuoNebs    Hyperlipidemia Continue atorvastatin   Hypothyroidism TSH 4.740 when checked on 11/19/2022.  Continue Synthroid.   Gout  Continue allopurinol   Disposition.  At this time, patient is stable for disposition home with outpatient PCP and nephrology follow-up.  Medical Consultants:   None.  Procedures:    Hemodialysis Subjective:   Today, patient was seen and examined at bedside.  Feels little short of breath especially lying down but okay with ambulation.  Discharge Exam:   Vitals:   12/18/23 0451 12/18/23 0821  BP: (!) 155/85 (!) 156/98  Pulse: 81 83  Resp:  18  Temp: 98.3 F (36.8 C) 97.9 F (36.6  C)  SpO2: 99% 99%   Vitals:   12/17/23 2029 12/17/23 2042 12/18/23 0451 12/18/23 0821  BP: (!) 164/99  (!) 155/85 (!) 156/98  Pulse: 87  81 83  Resp: 20   18  Temp: 98.2 F (36.8 C)  98.3 F (36.8 C) 97.9 F (36.6 C)  TempSrc: Oral  Oral Oral  SpO2: 97% 98% 99% 99%  Weight:         General: Alert awake, not in obvious distress, mildly anxious, on nasal cannula oxygen HENT: pupils equally reacting to light, mild pallor noted.  Oral mucosa is moist.  Chest:    Diminished breath sounds bilaterally.  CVS: S1 &S2 heard. No murmur.  Regular rate and rhythm. Abdomen: Soft, nontender, nondistended.  Bowel sounds are heard.   Extremities: No cyanosis, clubbing or edema.  Peripheral pulses are palpable. Psych: Alert, awake and oriented, normal mood CNS:  No cranial nerve deficits.  Power equal in all extremities.   Skin: Warm and dry.  No rashes noted.  The results of significant diagnostics from this hospitalization (including imaging, microbiology, ancillary and laboratory) are listed below for reference.     Diagnostic Studies:   DG Chest 2 View Result Date: 12/16/2023 CLINICAL DATA:  Shortness of breath.  Cough. EXAM: CHEST - 2 VIEW COMPARISON:  12/01/2023. FINDINGS: There are increased bronchovascular markings with cephalization of the upper lung zone vessels. Findings favor congestive heart failure/pulmonary edema, more pronounced than the recent prior exam from 12/01/2023. Bilateral lung fields are otherwise clear. No acute consolidation or lung collapse. There is subtle blunting of bilateral posterior costophrenic angles, suggesting trace pleural effusions. Stable mildly enlarged cardio-mediastinal silhouette. No acute osseous abnormalities. The soft tissues are within normal limits. IMPRESSION: *Findings favor congestive heart failure/pulmonary edema, more pronounced than the recent prior exam from 12/01/2023. Electronically Signed   By: Jules Schick M.D.   On: 12/16/2023 13:27     Labs:   Basic Metabolic Panel: Recent Labs  Lab 12/16/23 1122 12/17/23 0603 12/17/23 1129 12/18/23 0630  NA 136 137 134* 134*  K 3.5 3.8 3.7 3.6  CL 96* 98 95* 96*  CO2 25 26 25 26   GLUCOSE 115* 110* 101* 98  BUN 41* 50* 52* 36*  CREATININE 7.83* 9.06* 9.15* 6.29*  CALCIUM  8.0* 8.5* 8.3* 8.4*  PHOS  --  4.3 4.3 3.3   GFR Estimated Creatinine Clearance: 13.7 mL/min (A) (by C-G formula based on SCr of 6.29 mg/dL (H)). Liver Function Tests: Recent Labs  Lab 12/17/23 0603 12/17/23 1129 12/18/23 0630  ALBUMIN 3.1* 2.9* 3.0*   No results for input(s): "LIPASE", "AMYLASE" in the last 168 hours. No results for input(s): "AMMONIA" in the last 168 hours. Coagulation profile No results for input(s): "INR", "PROTIME" in the last 168 hours.  CBC: Recent Labs  Lab 12/16/23 1122 12/17/23 0603 12/17/23 1129  WBC 9.2 10.9* 10.3  HGB 6.0* 7.9* 7.5*  HCT 18.4* 23.8* 22.2*  MCV 74.5* 76.5* 75.8*  PLT 133* 137* 132*   Cardiac Enzymes: No results for input(s): "CKTOTAL", "CKMB", "CKMBINDEX", "TROPONINI" in the last 168 hours. BNP: Invalid input(s): "POCBNP" CBG: No results for input(s): "GLUCAP" in the last 168 hours. D-Dimer No results for input(s): "DDIMER" in the last 72 hours. Hgb A1c No results for input(s): "HGBA1C" in the last 72 hours. Lipid Profile No results for input(s): "CHOL", "HDL", "LDLCALC", "TRIG", "CHOLHDL", "LDLDIRECT" in the last 72 hours. Thyroid function studies No results for input(s): "TSH", "T4TOTAL", "T3FREE", "THYROIDAB" in the  last 72 hours.  Invalid input(s): "FREET3" Anemia work up No results for input(s): "VITAMINB12", "FOLATE", "FERRITIN", "TIBC", "IRON", "RETICCTPCT" in the last 72 hours. Microbiology Recent Results (from the past 240 hours)  Resp panel by RT-PCR (RSV, Flu A&B, Covid) Anterior Nasal Swab     Status: None   Collection Time: 12/16/23  1:53 PM   Specimen: Anterior Nasal Swab  Result Value Ref Range Status   SARS Coronavirus 2 by RT PCR NEGATIVE NEGATIVE Final   Influenza A by PCR NEGATIVE NEGATIVE Final   Influenza B by PCR NEGATIVE NEGATIVE Final    Comment: (NOTE) The Xpert Xpress SARS-CoV-2/FLU/RSV plus assay is intended as an aid in the diagnosis of influenza from Nasopharyngeal swab specimens  and should not be used as a sole basis for treatment. Nasal washings and aspirates are unacceptable for Xpert Xpress SARS-CoV-2/FLU/RSV testing.  Fact Sheet for Patients: BloggerCourse.com  Fact Sheet for Healthcare Providers: SeriousBroker.it  This test is not yet approved or cleared by the [place] FDA and has been authorized for detection and/or diagnosis of SARS-CoV-2 by FDA under an Emergency Use Authorization (EUA). This EUA will remain in effect (meaning this test can be used) for the duration of the COVID-19 declaration under Section 564(b)(1) of the Act, 21 U.S.C. section 360bbb-3(b)(1), unless the authorization is terminated or revoked.     Resp Syncytial Virus by PCR NEGATIVE NEGATIVE Final    Comment: (NOTE) Fact Sheet for Patients: BloggerCourse.com  Fact Sheet for Healthcare Providers: SeriousBroker.it  This test is not yet approved or cleared by the [place] FDA and has been authorized for detection and/or diagnosis of SARS-CoV-2 by FDA under an Emergency Use Authorization (EUA). This EUA will remain in effect (meaning this test can be used) for the duration of the COVID-19 declaration under Section 564(b)(1) of the Act, 21 U.S.C. section 360bbb-3(b)(1), unless the authorization is terminated or revoked.  Performed at [facility], [address] [place], [place] [address]      Discharge Instructions:   Discharge Instructions     Diet - low sodium heart healthy   Complete by: As directed    Fluid restriction of 1200 mL/day.   Discharge instructions   Complete by: As directed    Follow-up with your primary care provider in 1 week.  Discuss about getting checked up for sleep apnea. Check blood work at that time.  Seek medical attention for worsening symptoms.  Do not overexert.  Follow-up with nephrology as outpatient.   Increase activity  slowly   Complete by: As directed       Allergies as of 12/18/2023       Reactions   Heparin Other (See Comments)   Pt reports he's not sure what type of reaction   Naproxen Other (See Comments)   Unknown reaction    Shellfish Allergy Itching, Swelling        Medication List     TAKE these medications    allopurinol 100 MG tablet Commonly known as: ZYLOPRIM Take 1 tablet (100 mg total) by mouth daily.   amLODipine 10 MG tablet Commonly known as: NORVASC Take 10 mg by mouth daily.   atorvastatin 40 MG tablet Commonly known as: LIPITOR Take 1 tablet (40 mg total) by mouth daily.   colchicine 0.6 MG tablet TAKE 2 TABLETS BY MOUTH AT THE ONSET OF A GOUT FLARE. MAY REPEAT 1 TABLET IN 1 HOUR IF SYMPTOMS PERSIST   Dulera 100-5 MCG/ACT Aero Generic drug: mometasone-formoterol  Inhale 2 puffs into the lungs 2 (two) times daily.   ipratropium-albuterol 0.5-2.5 (3) MG/3ML Soln Commonly known as: DUONEB Inhale 3 mLs into the lungs every 6 (six) hours as needed.   labetalol 300 MG tablet Commonly known as: NORMODYNE Take 1 tablet by mouth twice daily   levothyroxine 100 MCG tablet Commonly known as: SYNTHROID Take 100 mcg by mouth daily.   mupirocin ointment 2 % Commonly known as: BACTROBAN Apply 1 Application topically 2 (two) times daily. What changed:  when to take this reasons to take this   sodium bicarbonate 650 MG tablet Take 650 mg by mouth 2 (two) times daily.   torsemide 100 MG tablet Commonly known as: DEMADEX Take 1 tablet (100 mg total) by mouth daily.   triamcinolone cream 0.1 % Commonly known as: KENALOG APPLY CREAM EXTERNALLY TWICE DAILY What changed:  how much to take how to take this when to take this reasons to take this additional instructions   Ventolin HFA 108 (90 Base) MCG/ACT inhaler Generic drug: albuterol Inhale 2 puffs into the lungs every 6 (six) hours as needed for wheezing or shortness of breath. What changed: how much to  take               Durable Medical Equipment  (From admission, onward)           Start     Ordered   12/17/23 1418  For home use only DME Nebulizer machine  Once       Question Answer Comment  Patient needs a nebulizer to treat with the following condition COPD (chronic obstructive pulmonary disease) (HCC)   Length of Need Lifetime   Additional equipment included Administration kit   Additional equipment included Filter      12/17/23 1420            Follow-up Information     Medicaid Transportation Follow up.   Contact information: phone [phone]        McClung, Angela M, [place] Follow up.   Specialty: Family Medicine Why: January 02, 2024 at 2:10 pm Contact information: [address] [place] [address] [phone]                  Time coordinating discharge: 39 minutes  Signed:  Salsabeel Gorelick  [facility] 12/18/2023, 12:02 PM

## 2023-12-18 NOTE — Plan of Care (Signed)

## 2023-12-18 NOTE — Progress Notes (Signed)
Right upper arm piv dcd, site unremarkable. Primary RN reviewed dc instructions with patient.  All belongings given to patient.

## 2023-12-18 NOTE — Plan of Care (Signed)
  Problem: Health Behavior/Discharge Planning: Goal: Ability to manage health-related needs will improve Outcome: Completed/Met   Problem: Clinical Measurements: Goal: Ability to maintain clinical measurements within normal limits will improve Outcome: Completed/Met Goal: Will remain free from infection Outcome: Completed/Met Goal: Diagnostic test results will improve Outcome: Completed/Met Goal: Respiratory complications will improve Outcome: Completed/Met Goal: Cardiovascular complication will be avoided Outcome: Completed/Met   Problem: Safety: Goal: Ability to remain free from injury will improve Outcome: Completed/Met   Problem: Skin Integrity: Goal: Risk for impaired skin integrity will decrease Outcome: Completed/Met

## 2023-12-18 NOTE — TOC Progression Note (Signed)
Transition of Care (TOC) - Progression Note   NEB machine delivered to patient's room  Patient Details  Name: Johnny [name] MRN: [id number] Date of Birth: 1961/04/30  Transition of Care [facility]) CM/SW Contact  Merisa Julio, Adria Devon, RN Phone Number: 12/18/2023, 10:20 AM  Clinical Narrative:       Expected Discharge Plan: Home/Self Care Barriers to Discharge: Continued Medical Work up  Expected Discharge Plan and Services In-house Referral: NA Discharge Planning Services: CM Consult   Living arrangements for the past 2 months: Apartment                 DME Arranged:  (see note)         HH Arranged: NA           Social Determinants of Health (SDOH) Interventions SDOH Screenings   Food Insecurity: No Food Insecurity (12/16/2023)  Housing: Low Risk  (12/16/2023)  Transportation Needs: Unmet Transportation Needs (12/16/2023)  Utilities: Not At Risk (12/16/2023)  Alcohol Screen: Low Risk  (11/20/2023)  Depression (PHQ2-9): Low Risk  (02/06/2023)  Financial Resource Strain: Low Risk  (11/20/2023)  Social Connections: Moderately Integrated (11/20/2023)  Stress: Stress Concern Present (11/20/2023)  Tobacco Use: Medium Risk (12/16/2023)  Health Literacy: Adequate Health Literacy (11/20/2023)    Readmission Risk Interventions    09/25/2022    1:43 PM  Readmission Risk Prevention Plan  Transportation Screening Complete  Medication Review (RN Care Manager) Complete  PCP or Specialist appointment within 3-5 days of discharge Complete  HRI or Home Care Consult Complete  SW Recovery Care/Counseling Consult --  Palliative Care Screening Not Applicable  Skilled Nursing Facility Not Applicable

## 2023-12-19 ENCOUNTER — Telehealth: Payer: Self-pay

## 2023-12-19 NOTE — Discharge Planning (Signed)
[facility] Patient Discharge Orders- [facility] CLINIC: [facility]  Patient's name: BURT PIATEK Admit/DC Dates: 12/16/2023 - 12/18/2023  Discharge Diagnoses: Symptomatic anemia - Hgb 6 at admit. S/p 2 units PRBCs. ESA raised in outpatient. See below  Chronic diastolic heart failure/dyspnea - CxR here showed CHF. EDW lowered. If still having dyspnea, challenge by 0.5kg and adjust EDW further if needed HTN Urgency - Continue Labetalol and Torsemide. Re-started Amlodipine 10mg  at bedtime. Possible OSA - it was advised to him to f/u with his PCP for a sleep study  Aranesp: Given: No     Last Hgb: 7.5 PRBC's Given: Yes Date/# of units: 2 units PRBCs given on 2/10 and 2/11 ESA dose for discharge: Raise mircera to 200 mcg IV q 2 weeks  IV Iron dose at discharge: Continue to follow Venofer protocol  Heparin change: No  EDW Change: Yes New EDW: Lower to 78.5. If he is still having dyspnea, challenge by 0.5kg and adjust EDW further if needed.  Bath Change: No  Access intervention/Change: No   Hectorol/Calcitriol change: Start calcitriol with HD per protocol  Discharge Labs: Calcium 8.4 Phosphorus 3.3 Albumin 3.0 K+ 3.6  IV Antibiotics: No   On Coumadin?: No    OTHER/APPTS/LAB ORDERS:  Not sure why his BFR is only at 300 at [facility]. Please raise to 350 at next HD (12/19/23) then to 400. When the BFR is at 400, raise DFR at 600. Noted PO sodium bicarb on dc med list. Advise him to stop this medication. No longer needed now that he's on dialysis We restarted his Amlodipine 10mg  at bedtime to help with his HTN. I educated him on ensuring he takes this daily at night. Ensure he is doing this     D/C Meds to be reconciled by nurse after every discharge.  Completed By: Salome Holmes, NP   Reviewed by: MD:______ RN_______

## 2023-12-19 NOTE — Transitions of Care (Post Inpatient/ED Visit) (Signed)
   12/19/2023  Name: Johnny [name] MRN: [id number] DOB: 08/12/1961  Today's TOC FU Call Status: Today's TOC FU Call Status:: Unsuccessful Call (1st Attempt) Unsuccessful Call (1st Attempt) Date: 12/19/23 TOC FU Call Complete Date:  (ERROR)  Attempted to reach the patient regarding the most recent Inpatient/ED visit. No answer, patient was to resume HD today at [facility].  Last GFR prior to discharge was 9.   Follow Up Plan: Additional outreach attempts will be made to reach the patient to complete the Transitions of Care (Post Inpatient/ED visit) call.    Gabriel Cirri MSN, RN RN Case Sales executive Health  [facility] Office Hours Wed/Thur  8:00 am-6:00 pm Direct Dial: [phone] Main Phone [phone]  Fax: [phone] [email or web address]

## 2023-12-20 ENCOUNTER — Telehealth: Payer: Self-pay

## 2023-12-20 NOTE — Progress Notes (Signed)
Complex Care Management Note Care Guide Note  12/20/2023 Name: Johnny [name] MRN: [id number] DOB: Feb 28, 1961   Complex Care Management Outreach Attempts: An unsuccessful telephone outreach was attempted today to offer the patient information about available complex care management services.  Follow Up Plan:  Additional outreach attempts will be made to offer the patient complex care management information and services.   Encounter Outcome:  No Answer  Leilan Bochenek [name]  [facility] Guide Direct Dial: [phone]  Fax: [phone] Website: [email or web address]

## 2023-12-20 NOTE — TOC Transition Note (Signed)
Transition of Care - Initial Contact from Inpatient Facility  Date of discharge: 12/18/23 Date of contact: 12/20/23 Method: Attempted Phone Spoke to: No Answer  Patient contacted to discuss transition of care from recent inpatient hospitalization but he didn't answer the phone. Unable to leave a voicemail because his mailbox was full.   Patient received HD on 2/13. Next HD 2/15 at [facility].  Salome Holmes, NP

## 2023-12-20 NOTE — Transitions of Care (Post Inpatient/ED Visit) (Signed)
   12/20/2023  Name: Johnny [name] MRN: [id number] DOB: 04-11-1961  Today's TOC FU Call Status: Today's TOC FU Call Status:: Unsuccessful Call (2nd Attempt) Unsuccessful Call (2nd Attempt) Date: 12/20/23  Attempted to reach the patient regarding the most recent Inpatient/ED visit.  Follow Up Plan: Additional outreach attempts will be made to reach the patient to complete the Transitions of Care (Post Inpatient/ED visit) call.   [name], RN, BA, [facility], CRRN [facility] Coordinator, Transition of Care Ph # [phone]

## 2023-12-23 ENCOUNTER — Telehealth: Payer: Self-pay

## 2023-12-23 NOTE — Progress Notes (Signed)
Complex Care Management Note Care Guide Note  12/23/2023 Name: Johnny [name] MRN: [id number] DOB: Jun 13, 1961  Johnny [name] is a 63 y.o. year old male who is a primary care patient of Hoy Register, MD . The community resource team was consulted for assistance with  housing instability, financial strain.  SDOH screenings and interventions completed:  Yes  Social Drivers of Health From This Encounter   Food Insecurity: No Food Insecurity (12/23/2023)   Hunger Vital Sign    Worried About Running Out of Food in the Last Year: Never true    Ran Out of Food in the Last Year: Never true  Housing: Low Risk  (12/23/2023)   Housing Stability Vital Sign    Unable to Pay for Housing in the Last Year: No    Number of Times Moved in the Last Year: 0    Homeless in the Last Year: No  Financial Resource Strain: Low Risk  (12/23/2023)   Overall Financial Resource Strain (CARDIA)    Difficulty of Paying Living Expenses: Not very hard  Transportation Needs: Unmet Transportation Needs (12/23/2023)   PRAPARE - Administrator, Civil Service (Medical): Yes    Lack of Transportation (Non-Medical): No  Utilities: Not At Risk (12/23/2023)   Utilities    Threatened with loss of utilities: No    SDOH Interventions Today    Flowsheet Row Most Recent Value  SDOH Interventions   Transportation Interventions Other (Comment)  [[employer] Medicaid Care Manager Program assists patients with transportation needs.]        Care guide performed the following interventions: Spoke to patient about [facility]. Patient was unaware that this program was part of his coverage. They assist with housing options, transportation and financial help. Confirmed email address to send brochure patient has the contact number.  Follow Up Plan:  No further follow up planned at this time. The patient has been provided with needed resources.  Encounter Outcome:  Patient Visit Completed  [name]  [name] Guide Direct Dial: [phone]  Fax: [phone] Website: [email or web address]

## 2023-12-23 NOTE — Transitions of Care (Post Inpatient/ED Visit) (Signed)
   12/23/2023  Name: FORREST JAROSZEWSKI MRN: [id number] DOB: [birth year]  Today's TOC FU Call Status: Today's TOC FU Call Status:: Unsuccessful Call (3rd Attempt) Unsuccessful Call (3rd Attempt) Date: 12/23/23  Attempted to reach the patient regarding the most recent Inpatient/ED visit.  Follow Up Plan: No further outreach attempts will be made at this time. We have been unable to contact the patient.  [name], RN, BA, [facility], CRRN [facility] Coordinator, Transition of Care Ph # [phone]

## 2023-12-28 ENCOUNTER — Other Ambulatory Visit: Payer: Self-pay | Admitting: Physician Assistant

## 2023-12-28 DIAGNOSIS — E7800 Pure hypercholesterolemia, unspecified: Secondary | ICD-10-CM

## 2023-12-31 ENCOUNTER — Telehealth: Payer: Self-pay

## 2023-12-31 NOTE — Telephone Encounter (Signed)
 He was not hospitalized for 1 month.  His hospitalization dates are 11/28/23 - 12/03/23 and 12/16/2023 - 12/18/2023  I can write a note to cover those dates.

## 2023-12-31 NOTE — Telephone Encounter (Signed)
 Attempted to contact patient and mailbox is currently full.

## 2023-12-31 NOTE — Telephone Encounter (Signed)
 Does patient need a visit to discuss letter. Per epic he has had 2 hospital admissions.  Copied from CRM [id number]. Topic: General - Other >> Dec 30, 2023  4:59 PM Kristie Cowman wrote: Reason for CRM: Patient was in the hospital recently and needs a note for your school excusing you from classes for the dates he was hospitalized (approximately one month).   Best contact number - [phone]

## 2024-01-01 NOTE — Progress Notes (Unsigned)
 Patient ID: Johnny [name], male   DOB: 07/15/1961, 64 y.o.   MRN: [id number]   Johnny [name], is a 63 y.o. male  Johnny [name]  Johnny [name]  DOB - August 28, 1961  Chief Complaint  Patient presents with   Hospitalization Follow-up    Left nipple pain Check iron levels Discuss Torsemide       Subjective:   Johnny [name] is a 63 y.o. male here today for a follow up visit after hospitalization with symptomatic anemia complicated by other conditions.  He has been refusing dialysis until his last visit when I was able to explain to him the benefits of it.  He is now doing dialysis T,Th, Saturday.  He says he feels better than he has in 30 years.  He is thrilled and can't believe how much better he is feeling.  His appetite is better.  He is getting around better.  Breathing is improving.  No edema.    He is asking about someone who was helping him with housing information so Erskine Squibb is meeting with him as well today.  He is currently at [facility].  He has noticed a lump under his L nipple.  At times it is tender but he has noticed it getting larger the last couple months  Hospitalization 2/10-2/10/2024 Follow up with your primary care provider in one week.  Check CBC, BMP, magnesium in the next visit Please consider sleep study as outpatient to assess for sleep apnea.  Principal Problem:   Symptomatic anemia Active Problems:   ESRD on dialysis (HCC)   Hypertensive urgency   COPD (chronic obstructive pulmonary disease) (HCC)   Hyperlipidemia   Hypothyroidism   Gout  Johnny [name] is a 63 y.o. male with medical history significant of hypertension, hyperlipidemia, history of end stage renal disease on hemodialysis, heart failure with preserved ejection pressure, COPD, hypothyroidism, obstructive sleep apnea, gout presented to hospital with fatigue and shortness of breath for the last 3 days with cough but no fever or chills., He recently started dialysis during last hospitalization from  1/23-1/24 after having a fistula in place for nearly 20 years prior to being started on hemodialysis.  Received 2 units of packed RBC at that time he was currently on Tuesday, Thursday, and Saturday schedule of hemodialysis..  In the ED, patient was afebrile but tachypneic with elevated blood pressure.  Labs were significant for hemoglobin of 6.0.  Platelet 137. Stool guaiac was noted to be negative.  Chest x-ray revealed findings favoring congestive heart failure with pulmonary edema more pronounced than prior exam from 1/26.  Patient was given 2 units of packed RBC and was admitted to the  hospital for further evaluation and treatment.   Symptomatic anemia With fatigue and weakness.  Hemoglobin was 6.0 on presentation but stool guaiac negative.  Received 2 units of packed RBC.  Still complains of mild dyspnea on lying down but okay with ambulation.   Hypoxia , dyspnea and shortness of breath.   Chronic diastolic heart failure.   Likely secondary to fluid overload.  Received 2 units of packed RBC.  Hemodialysis x 2 in the hospital.  At this time, patient has been seen by nephrology and okay for disposition home.  Patient will need to get ESA as outpatient.  Received first dose on 12/14/23.  Patient was also advised to follow-up with his primary care provider to discuss about obstructive sleep apnea assessment as outpatient.   ESRD on HD Recently started on dialysis.  Hemodialysis during hospitalization.  Hypertensive urgency  Continue labetalol and torsemide.  Will resume amlodipine on discharge.   COPD -Continue Dulera, DuoNebs    Hyperlipidemia Continue atorvastatin   Hypothyroidism TSH 4.740 when checked on 11/19/2022.  Continue Synthroid.   Gout  Continue allopurinol    Disposition.  At this time, patient is stable for disposition home with outpatient PCP and nephrology follow-up. No problems updated.  ALLERGIES: Allergies  Allergen Reactions   Heparin Other (See Comments)    Pt  reports he's not sure what type of reaction   Naproxen Other (See Comments)    Unknown reaction    Shellfish Allergy Itching and Swelling    PAST MEDICAL HISTORY: Past Medical History:  Diagnosis Date   Adenomatous colon polyp    tubular   Anemia    Asthma    as a child   CHF (congestive heart failure) (HCC)    Chronic kidney disease    COPD (chronic obstructive pulmonary disease) (HCC)    Diverticulosis    External otitis of right ear 11/10/2018   Gout    Hepatitis C    HLD (hyperlipidemia)    Hypertension    OSA (obstructive sleep apnea) 11/03/2015   Sleep apnea    Strep throat 01/2021   Thyroid disorder     MEDICATIONS AT HOME: Prior to Admission medications   Medication Sig Start Date End Date Taking? Authorizing Provider  albuterol (VENTOLIN HFA) 108 (90 Base) MCG/ACT inhaler Inhale 2 puffs into the lungs every 6 (six) hours as needed for wheezing or shortness of breath. Patient taking differently: Inhale 1 puff into the lungs every 6 (six) hours as needed for wheezing or shortness of breath. 12/01/21  Yes Dorcas Carrow, MD  allopurinol (ZYLOPRIM) 100 MG tablet Take 1 tablet (100 mg total) by mouth daily. 12/18/23  Yes Pokhrel, Laxman, MD  colchicine 0.6 MG tablet TAKE 2 TABLETS BY MOUTH AT THE ONSET OF A GOUT FLARE. MAY REPEAT 1 TABLET IN 1 HOUR IF SYMPTOMS PERSIST 10/15/23  Yes Newlin, Enobong, MD  ipratropium-albuterol (DUONEB) 0.5-2.5 (3) MG/3ML SOLN Inhale 3 mLs into the lungs every 6 (six) hours as needed. 12/18/23  Yes Pokhrel, [name], MD  labetalol (NORMODYNE) 300 MG tablet Take 1 tablet by mouth twice daily 12/09/23  Yes Newlin, Enobong, MD  levothyroxine (SYNTHROID) 100 MCG tablet Take 100 mcg by mouth daily. 10/15/23  Yes [provider]  mometasone-formoterol (DULERA) 100-5 MCG/ACT AERO Inhale 2 puffs into the lungs 2 (two) times daily. 11/07/22  Yes Anders Simmonds, PA-C  mupirocin ointment (BACTROBAN) 2 % Apply 1 Application topically 2 (two) times  daily. Patient taking differently: Apply 1 Application topically as needed (rash on hand). 11/07/22  Yes Anders Simmonds, PA-C  sodium bicarbonate 650 MG tablet Take 650 mg by mouth 2 (two) times daily. 09/06/23  Yes [provider]  triamcinolone cream (KENALOG) 0.1 % APPLY CREAM EXTERNALLY TWICE DAILY Patient taking differently: Apply 1 Application topically as needed (rash on hand). 10/23/23  Yes Hoy Register, MD  amLODipine (NORVASC) 10 MG tablet Take 1 tablet (10 mg total) by mouth daily. 01/02/24   Anders Simmonds, PA-C  atorvastatin (LIPITOR) 40 MG tablet Take 1 tablet (40 mg total) by mouth daily. 01/02/24   Anders Simmonds, PA-C  torsemide (DEMADEX) 100 MG tablet Take 1 tablet (100 mg total) by mouth daily. 01/02/24 06/30/24  Anders Simmonds, PA-C    ROS: Neg HEENT Neg resp Neg cardiac Neg GI Neg GU Neg MS Neg  psych Neg neuro  Objective:   Vitals:   01/02/24 1425  BP: (!) 142/79  Pulse: 74  SpO2: 100%  Weight: 171 lb 3.2 oz (77.7 kg)  Height: 6' (1.829 m)   Exam  BP recheck manual by me 128/68 General appearance : Awake, alert, not in any distress. Speech Clear. Not toxic looking  he actually looks great compared to last visit HEENT: Atraumatic and Normocephalic Neck: Supple, no JVD. No cervical lymphadenopathy.  Chest: Good air entry bilaterally, CTAB.  No rales/rhonchi/wheezing CVS: S1 S2 regular, no murmurs.  Chest: R breast without lump or mass.  L breast there is a subareolar mass a 5x4cm that is firm but not fixed.  Mildly tender.  More firm than lipoma.  No puncta.  No erythema or sign of it being a boil Extremities: B/L Lower Ext shows no edema, both legs are warm to touch Neurology: Awake alert, and oriented X 3, CN II-XII intact, Non focal Skin: No Rash  Data Review Lab Results  Component Value Date   HGBA1C 5.3 02/06/2023   HGBA1C 5.3 05/24/2022   HGBA1C  02/21/2009    5.3 (NOTE) The ADA recommends the following therapeutic goal for  glycemic control related to Hgb A1c measurement: Goal of therapy: <6.5 Hgb A1c  Reference: American Diabetes Association: Clinical Practice Recommendations 2010, Diabetes Care, 2010, 33: (Suppl  1).    Assessment & Plan   1. Pure hypercholesterolemia continue - atorvastatin (LIPITOR) 40 MG tablet; Take 1 tablet (40 mg total) by mouth daily.  Dispense: 90 tablet; Refill: 1  2. Chronic diastolic congestive heart failure (HCC) - torsemide (DEMADEX) 100 MG tablet; Take 1 tablet (100 mg total) by mouth daily.  Dispense: 90 tablet; Refill: 1  3. Acquired hypothyroidism (Primary) On synthroid  4. ESRD on hemodialysis (HCC) T, Th, Sat-he has needed this for a long time.  I congratulated him on making the decision and he is already feeling so much better!!!  5. Anemia of chronic disease Will assess s/p transfusion - CBC with Differential - Iron, TIBC and Ferritin Panel  6. Essential hypertension controlled - amLODipine (NORVASC) 10 MG tablet; Take 1 tablet (10 mg total) by mouth daily.  Dispense: 90 tablet; Refill: 1  7. Subareolar mass of left breast ?  Possible lipoma but seems firmer than that.  Does not appear like a boil or cyst.  R/o mass with imaging - MM 3D DIAGNOSTIC MAMMOGRAM UNILATERAL LEFT BREAST; Future    Return in about 3 months (around 03/31/2024) for PCP for chronic conditions-Newlin.  The patient was given clear instructions to go to ER or return to medical center if symptoms don't improve, worsen or new problems develop. The patient verbalized understanding. The patient was told to call to get lab results if they haven't heard anything in the next week.      Georgian Co, PA-C [facility] [place], [place] [phone]   01/02/2024, 5:11 PM

## 2024-01-01 NOTE — Telephone Encounter (Signed)
 Pt is agreeable to letter being written.

## 2024-01-01 NOTE — Telephone Encounter (Signed)
 Pt called back and verbalized understanding of notes/date parameters.

## 2024-01-02 ENCOUNTER — Telehealth: Payer: Self-pay

## 2024-01-02 ENCOUNTER — Ambulatory Visit: Payer: MEDICAID | Attending: Physician Assistant | Admitting: Physician Assistant

## 2024-01-02 VITALS — BP 128/68 | HR 74 | Ht 72.0 in | Wt 171.2 lb

## 2024-01-02 DIAGNOSIS — E039 Hypothyroidism, unspecified: Secondary | ICD-10-CM

## 2024-01-02 DIAGNOSIS — E7800 Pure hypercholesterolemia, unspecified: Secondary | ICD-10-CM | POA: Diagnosis not present

## 2024-01-02 DIAGNOSIS — N186 End stage renal disease: Secondary | ICD-10-CM | POA: Diagnosis not present

## 2024-01-02 DIAGNOSIS — Z992 Dependence on renal dialysis: Secondary | ICD-10-CM

## 2024-01-02 DIAGNOSIS — I10 Essential (primary) hypertension: Secondary | ICD-10-CM

## 2024-01-02 DIAGNOSIS — D638 Anemia in other chronic diseases classified elsewhere: Secondary | ICD-10-CM

## 2024-01-02 DIAGNOSIS — I5032 Chronic diastolic (congestive) heart failure: Secondary | ICD-10-CM | POA: Diagnosis not present

## 2024-01-02 DIAGNOSIS — N6342 Unspecified lump in left breast, subareolar: Secondary | ICD-10-CM

## 2024-01-02 MED ORDER — ATORVASTATIN CALCIUM 40 MG PO TABS: 40.0000 mg | ORAL_TABLET | Freq: Every day | ORAL | 1 refills | Status: AC

## 2024-01-02 MED ORDER — AMLODIPINE BESYLATE 10 MG PO TABS: 10.0000 mg | ORAL_TABLET | Freq: Every day | ORAL | 1 refills | Status: DC

## 2024-01-02 MED ORDER — TORSEMIDE 100 MG PO TABS: 100.0000 mg | ORAL_TABLET | Freq: Every day | ORAL | 1 refills | Status: DC

## 2024-01-02 NOTE — Telephone Encounter (Signed)
 Letter has been done

## 2024-01-02 NOTE — Telephone Encounter (Signed)
 At the request of Georgian Co, PA, I met with the patient when he was in the office.  He explained that he is working with [employer] [place] to address and " eviction."  He could not clarify if he has received an official eviction notice or  if he has a court date.  He could only say that he has received a letter that references eviction.  He explained that the reason for  the " eviction" is that he had individuals stay with him, more than one,  unannounced, that were banned from the property. In the meantime he is looking for another place to live.   He went on to say that he was talking with a caseworker about housing resources.   Per Epic, Millicent Adams, Care Guide/ [facility] has addressed this with him and referred him to [facility].  I asked him if he had contacted [facility] and he said yes, and they told him to call [facility].  He said he spoke to someone at [facility] and is waiting for a call back. I explained that they may be able to help him with housing and he said he was not aware. I told him to call [facility] back in a week if he has not heard from them.   He said he understood and would call them back.   He also informed me that he is almost finished with his degree from [employer].  He said he was supposed to be done in May but still has some coursework to complete.  I commended him on this accomplishment.  He stated he is very proud of what he has done and is not giving up.

## 2024-01-02 NOTE — Telephone Encounter (Signed)
 Patient has been informed.

## 2024-01-02 NOTE — Patient Instructions (Signed)
 Johnny [name]-Population Health by Tressie Ellis [phone]

## 2024-01-03 LAB — IRON,TIBC AND FERRITIN PANEL
Ferritin: 802 ng/mL — ABNORMAL HIGH (ref 30–400)
Iron Saturation: 33 % (ref 15–55)
Iron: 80 ug/dL (ref 38–169)
Total Iron Binding Capacity: 239 ug/dL — ABNORMAL LOW (ref 250–450)
UIBC: 159 ug/dL (ref 111–343)

## 2024-01-03 LAB — CBC WITH DIFFERENTIAL/PLATELET
Basophils Absolute: 0.1 10*3/uL (ref 0.0–0.2)
Basos: 1 %
EOS (ABSOLUTE): 0.3 10*3/uL (ref 0.0–0.4)
Eos: 4 %
Hematocrit: 27.3 % — ABNORMAL LOW (ref 37.5–51.0)
Hemoglobin: 9 g/dL — ABNORMAL LOW (ref 13.0–17.7)
Immature Grans (Abs): 0 10*3/uL (ref 0.0–0.1)
Immature Granulocytes: 0 %
Lymphocytes Absolute: 1.3 10*3/uL (ref 0.7–3.1)
Lymphs: 18 %
MCH: 26.4 pg — ABNORMAL LOW (ref 26.6–33.0)
MCHC: 33 g/dL (ref 31.5–35.7)
MCV: 80 fL (ref 79–97)
Monocytes Absolute: 0.8 10*3/uL (ref 0.1–0.9)
Monocytes: 10 %
Neutrophils Absolute: 4.9 10*3/uL (ref 1.4–7.0)
Neutrophils: 67 %
Platelets: 107 10*3/uL — ABNORMAL LOW (ref 150–450)
RBC: 3.41 x10E6/uL — ABNORMAL LOW (ref 4.14–5.80)
RDW: 22.6 % — ABNORMAL HIGH (ref 11.6–15.4)
WBC: 7.4 10*3/uL (ref 3.4–10.8)

## 2024-01-06 ENCOUNTER — Encounter: Payer: Self-pay | Admitting: Physician Assistant

## 2024-01-06 ENCOUNTER — Telehealth: Payer: Self-pay

## 2024-01-06 NOTE — Telephone Encounter (Signed)
 Pt was called and no vm was left due to mailbox being full. Information has been sent to nurse pool.

## 2024-01-06 NOTE — Telephone Encounter (Signed)
-----   Message from Georgian Co sent at 01/06/2024 11:33 AM EST ----- Your hemoglobin is improved at 9.0.  your platelets are still low. Continue taking iron tablet daily.  Thanks, Georgian Co, PA-C

## 2024-01-07 ENCOUNTER — Emergency Department (HOSPITAL_COMMUNITY): Payer: MEDICAID

## 2024-01-07 ENCOUNTER — Other Ambulatory Visit: Payer: Self-pay

## 2024-01-07 ENCOUNTER — Encounter (HOSPITAL_COMMUNITY): Payer: Self-pay

## 2024-01-07 ENCOUNTER — Emergency Department (HOSPITAL_COMMUNITY)
Admission: EM | Admit: 2024-01-07 | Discharge: 2024-01-07 | Disposition: A | Discharge status: Home / Self Care | Payer: MEDICAID | Attending: Emergency Medicine | Admitting: Emergency Medicine

## 2024-01-07 ENCOUNTER — Other Ambulatory Visit: Payer: Self-pay | Admitting: Physician Assistant

## 2024-01-07 DIAGNOSIS — Z992 Dependence on renal dialysis: Secondary | ICD-10-CM | POA: Diagnosis not present

## 2024-01-07 DIAGNOSIS — I132 Hypertensive heart and chronic kidney disease with heart failure and with stage 5 chronic kidney disease, or end stage renal disease: Secondary | ICD-10-CM | POA: Insufficient documentation

## 2024-01-07 DIAGNOSIS — R0602 Shortness of breath: Secondary | ICD-10-CM | POA: Diagnosis not present

## 2024-01-07 DIAGNOSIS — E039 Hypothyroidism, unspecified: Secondary | ICD-10-CM | POA: Insufficient documentation

## 2024-01-07 DIAGNOSIS — R0781 Pleurodynia: Secondary | ICD-10-CM | POA: Insufficient documentation

## 2024-01-07 DIAGNOSIS — J449 Chronic obstructive pulmonary disease, unspecified: Secondary | ICD-10-CM | POA: Insufficient documentation

## 2024-01-07 DIAGNOSIS — N6342 Unspecified lump in left breast, subareolar: Secondary | ICD-10-CM

## 2024-01-07 DIAGNOSIS — N186 End stage renal disease: Secondary | ICD-10-CM | POA: Diagnosis not present

## 2024-01-07 DIAGNOSIS — Z79899 Other long term (current) drug therapy: Secondary | ICD-10-CM | POA: Insufficient documentation

## 2024-01-07 DIAGNOSIS — R079 Chest pain, unspecified: Secondary | ICD-10-CM | POA: Diagnosis present

## 2024-01-07 DIAGNOSIS — I509 Heart failure, unspecified: Secondary | ICD-10-CM | POA: Diagnosis not present

## 2024-01-07 LAB — BASIC METABOLIC PANEL
Anion gap: 14 (ref 5–15)
BUN: 30 mg/dL — ABNORMAL HIGH (ref 6–20)
CO2: 29 mmol/L (ref 22–32)
Calcium: 8.4 mg/dL — ABNORMAL LOW (ref 8.9–10.3)
Chloride: 95 mmol/L — ABNORMAL LOW (ref 98–111)
Creatinine, Ser: 5.09 mg/dL — ABNORMAL HIGH (ref 0.61–1.24)
GFR, Estimated: 12 mL/min — ABNORMAL LOW (ref >60)
Glucose, Bld: 131 mg/dL — ABNORMAL HIGH (ref 70–99)
Potassium: 3.7 mmol/L (ref 3.5–5.1)
Sodium: 138 mmol/L (ref 135–145)

## 2024-01-07 LAB — CBC
HCT: 26.4 % — ABNORMAL LOW (ref 39.0–52.0)
Hemoglobin: 8.8 g/dL — ABNORMAL LOW (ref 13.0–17.0)
MCH: 26.2 pg (ref 26.0–34.0)
MCHC: 33.3 g/dL (ref 30.0–36.0)
MCV: 78.6 fL — ABNORMAL LOW (ref 80.0–100.0)
Platelets: 149 10*3/uL — ABNORMAL LOW (ref 150–400)
RBC: 3.36 MIL/uL — ABNORMAL LOW (ref 4.22–5.81)
RDW: 21.7 % — ABNORMAL HIGH (ref 11.5–15.5)
WBC: 12.4 10*3/uL — ABNORMAL HIGH (ref 4.0–10.5)
nRBC: 0 % (ref 0.0–0.2)

## 2024-01-07 LAB — TROPONIN I (HIGH SENSITIVITY)
Troponin I (High Sensitivity): 29 ng/L — ABNORMAL HIGH (ref <18)
Troponin I (High Sensitivity): 34 ng/L — ABNORMAL HIGH (ref <18)

## 2024-01-07 LAB — RESP PANEL BY RT-PCR (RSV, FLU A&B, COVID)  RVPGX2
Influenza A by PCR: NEGATIVE
Influenza B by PCR: NEGATIVE
Resp Syncytial Virus by PCR: NEGATIVE
SARS Coronavirus 2 by RT PCR: NEGATIVE

## 2024-01-07 MED ORDER — HYDROCODONE-ACETAMINOPHEN 5-325 MG PO TABS
1.0000 | ORAL_TABLET | Freq: Once | ORAL | Status: AC
  Administered 2024-01-07: 1 via ORAL
  Filled 2024-01-07: qty 1

## 2024-01-07 MED ORDER — ALBUTEROL SULFATE HFA 108 (90 BASE) MCG/ACT IN AERS
2.0000 | INHALATION_SPRAY | RESPIRATORY_TRACT | Status: DC | PRN
  Administered 2024-01-07: 2 via RESPIRATORY_TRACT
  Filled 2024-01-07: qty 6.7

## 2024-01-07 NOTE — Discharge Instructions (Addendum)
 The blood work today looks pretty good.  No signs of pneumonia on your x-ray.  It is okay to use your nebulizer as needed at home.  If you start having fever, coughing up stuff or you use the nebulizer and it does not help return to the emergency room.  Continue your regular scheduled dialysis on Thursday.

## 2024-01-07 NOTE — ED Triage Notes (Addendum)
 [place] [facility] [place] [facility]OB x 3 hours, sudden onset. Also reports CP. Reports worse with exertion and deep breathing.  HX COPD, CHF, had dialysis this morning.   No medications given by EMS  Last VS: 148/90, 18RR, CBG 175 p 60, 100%RA

## 2024-01-07 NOTE — ED Provider Notes (Signed)
 [place] EMERGENCY DEPARTMENT AT [facility] Provider Note   CSN: [id number] Arrival date & time: 01/07/24  1404     History  Chief Complaint  Patient presents with   Shortness of Breath   Chest Pain    Johnny [name] is a 63 y.o. male.  Pt is a 63 y.o. male with medical history significant of hypertension, hyperlipidemia, history of end stage renal disease on hemodialysis Tu/Th/S, heart failure with preserved ejection pressure, COPD, hypothyroidism, obstructive sleep apnea, gout who is presenting today with complaint of chest pain and shortness of breath.  Patient reports it started while he was at dialysis today.  He is also complaining of a sore throat that goes down into his chest.  He has had a mild cough but denies any productive cough.  He does report that the chest pain is worse with exertion and deep breathing.  He has not tried his inhaler today.  He is not aware of having a fever.  He also complains of a little bit of abdominal pain as well but no nausea or vomiting.  Denies ever having symptoms like this in the past.  He was admitted a little less than a month ago for shortness of breath at that time was found to have symptomatic anemia but no chest pain at that time.  The history is provided by the patient.  Shortness of Breath Associated symptoms: chest pain   Chest Pain Associated symptoms: shortness of breath        Home Medications Prior to Admission medications   Medication Sig Start Date End Date Taking? Authorizing Provider  albuterol (VENTOLIN HFA) 108 (90 Base) MCG/ACT inhaler Inhale 2 puffs into the lungs every 6 (six) hours as needed for wheezing or shortness of breath. Patient taking differently: Inhale 1 puff into the lungs every 6 (six) hours as needed for wheezing or shortness of breath. 12/01/21   Dorcas Carrow, MD  allopurinol (ZYLOPRIM) 100 MG tablet Take 1 tablet (100 mg total) by mouth daily. 12/18/23   Pokhrel, [name], MD  amLODipine  (NORVASC) 10 MG tablet Take 1 tablet (10 mg total) by mouth daily. 01/02/24   Anders Simmonds, PA-C  atorvastatin (LIPITOR) 40 MG tablet Take 1 tablet (40 mg total) by mouth daily. 01/02/24   Anders Simmonds, PA-C  colchicine 0.6 MG tablet TAKE 2 TABLETS BY MOUTH AT THE ONSET OF A GOUT FLARE. MAY REPEAT 1 TABLET IN 1 HOUR IF SYMPTOMS PERSIST 10/15/23   Newlin, Odette Horns, MD  ipratropium-albuterol (DUONEB) 0.5-2.5 (3) MG/3ML SOLN Inhale 3 mLs into the lungs every 6 (six) hours as needed. 12/18/23   Pokhrel, [name], MD  labetalol (NORMODYNE) 300 MG tablet Take 1 tablet by mouth twice daily 12/09/23   Hoy Register, MD  levothyroxine (SYNTHROID) 100 MCG tablet Take 100 mcg by mouth daily. 10/15/23   [provider]  mometasone-formoterol (DULERA) 100-5 MCG/ACT AERO Inhale 2 puffs into the lungs 2 (two) times daily. 11/07/22   Anders Simmonds, PA-C  mupirocin ointment (BACTROBAN) 2 % Apply 1 Application topically 2 (two) times daily. Patient taking differently: Apply 1 Application topically as needed (rash on hand). 11/07/22   Anders Simmonds, PA-C  sodium bicarbonate 650 MG tablet Take 650 mg by mouth 2 (two) times daily. 09/06/23   [provider]  torsemide (DEMADEX) 100 MG tablet Take 1 tablet (100 mg total) by mouth daily. 01/02/24 06/30/24  Anders Simmonds, PA-C  triamcinolone cream (KENALOG) 0.1 % APPLY  CREAM EXTERNALLY TWICE DAILY Patient taking differently: Apply 1 Application topically as needed (rash on hand). 10/23/23   Hoy Register, MD      Allergies    Heparin, Naproxen, and Shellfish allergy    Review of Systems   Review of Systems  Respiratory:  Positive for shortness of breath.   Cardiovascular:  Positive for chest pain.    Physical Exam Updated Vital Signs BP 132/86   Pulse 83   Temp 99.8 F (37.7 C)   Resp 17   Ht 6' (1.829 m)   Wt 77.6 kg   SpO2 97%   BMI 23.19 kg/m  Physical Exam Vitals and nursing note reviewed.  Constitutional:       General: He is not in acute distress.    Appearance: He is well-developed.  HENT:     Head: Normocephalic and atraumatic.  Eyes:     Conjunctiva/sclera: Conjunctivae normal.     Pupils: Pupils are equal, round, and reactive to light.  Cardiovascular:     Rate and Rhythm: Normal rate and regular rhythm.     Heart sounds: No murmur heard. Pulmonary:     Effort: Pulmonary effort is normal. No respiratory distress.     Breath sounds: Examination of the right-lower field reveals rales. Rales present. No wheezing.  Chest:     Chest wall: Tenderness present.  Abdominal:     General: There is no distension.     Palpations: Abdomen is soft.     Tenderness: There is no abdominal tenderness. There is no guarding or rebound.  Musculoskeletal:        General: No tenderness. Normal range of motion.     Cervical back: Normal range of motion and neck supple.  Skin:    General: Skin is warm and dry.     Findings: No erythema or rash.  Neurological:     Mental Status: He is alert and oriented to person, place, and time.  Psychiatric:        Behavior: Behavior normal.     ED Results / Procedures / Treatments   Labs (all labs ordered are listed, but only abnormal results are displayed) Labs Reviewed  BASIC METABOLIC PANEL - Abnormal; Notable for the following components:      Result Value   Chloride 95 (*)    Glucose, Bld 131 (*)    BUN 30 (*)    Creatinine, Ser 5.09 (*)    Calcium 8.4 (*)    GFR, Estimated 12 (*)    All other components within normal limits  CBC - Abnormal; Notable for the following components:   WBC 12.4 (*)    RBC 3.36 (*)    Hemoglobin 8.8 (*)    HCT 26.4 (*)    MCV 78.6 (*)    RDW 21.7 (*)    Platelets 149 (*)    All other components within normal limits  TROPONIN I (HIGH SENSITIVITY) - Abnormal; Notable for the following components:   Troponin I (High Sensitivity) 29 (*)    All other components within normal limits  TROPONIN I (HIGH SENSITIVITY) - Abnormal;  Notable for the following components:   Troponin I (High Sensitivity) 34 (*)    All other components within normal limits  RESP PANEL BY RT-PCR (RSV, FLU A&B, COVID)  RVPGX2    EKG None ED ECG REPORT   Date: 01/07/2024  Rate: 81  Rhythm: normal sinus rhythm  QRS Axis: normal  Intervals: normal  ST/T Wave abnormalities: normal  Conduction Disutrbances:none  Narrative Interpretation:   Old EKG Reviewed: unchanged  I have personally reviewed the EKG tracing and agree with the computerized printout as noted.  Radiology DG Chest 2 View Result Date: 01/07/2024 CLINICAL DATA:  Chest pain and shortness of breath. EXAM: CHEST - 2 VIEW COMPARISON:  December 16, 2023 FINDINGS: There is stable moderate severity cardiac silhouette enlargement. Mild calcification of the aortic arch is seen. There is stable prominence of the central pulmonary vasculature with mild diffusely increased interstitial lung markings noted. No pleural effusion or pneumothorax is identified. The visualized skeletal structures are unremarkable. IMPRESSION: Stable cardiomegaly with mild pulmonary vascular congestion and mild interstitial edema. Electronically Signed   By: Aram Candela M.D.   On: 01/07/2024 17:29    Procedures Procedures    Medications Ordered in ED Medications  albuterol (VENTOLIN HFA) 108 (90 Base) MCG/ACT inhaler 2 puff (2 puffs Inhalation Given 01/07/24 1739)  HYDROcodone-acetaminophen (NORCO/VICODIN) 5-325 MG per tablet 1 tablet (1 tablet Oral Given 01/07/24 1738)    ED Course/ Medical Decision Making/ A&P                                 Medical Decision Making Amount and/or Complexity of Data Reviewed External Data Reviewed: notes. Labs: ordered. Decision-making details documented in ED Course. Radiology: ordered and independent interpretation performed. Decision-making details documented in ED Course. ECG/medicine tests: ordered and independent interpretation performed. Decision-making  details documented in ED Course.  Risk Prescription drug management.   Pt with multiple medical problems and comorbidities and presenting today with a complaint that caries a high risk for morbidity and mortality.  Here today with complaint of chest pain, throat pain and shortness of breath that started about 3 hours ago.  He reports it started while he was at dialysis but has persisted.  He reports he cannot lay down because it seems to make the pain worse.  It is worse with taking a deep breath as well.  He does report a cough but denies any fever.  Concern for viral etiology, pneumonia, PE, fluid overload with a history of CHF.  Also possibility for GI symptoms.  Patient's vital signs are normal and exam is reassuring.  No significant findings for fluid overload today.  Will give patient pain control and try his inhaler.  I independently interpreted patient's labs and EKG.  EKG without acute changes.  CBC shows leukocytosis of 12.4, hemoglobin of 8.8 which is similar to last month after he was transfused, CMP with findings consistent with end-stage renal disease but no potassium abnormalities, troponin is 29 which looks similar to prior. I have independently visualized and interpreted pt's images today.  Chest x-ray did show cardiomegaly with some mild pulmonary edema.  Which is similar to prior.  6:09 PM Repeat troponin is 34 without significant change.  On reevaluation after inhaler and a pain pill patient reports he feels a lot better.  He does have a nebulizer at home he can use.  Low suspicion for blood clot at this time or pneumonia.  Low suspicion that this is ACS.  At this time feel that patient can be discharged home to continue using his nebulizer but was given return precautions.  He and his family member are comfortable with this plan.  Encouraged continuing his regular scheduled dialysis as well.          Final Clinical Impression(s) / ED Diagnoses Final diagnoses:  Pleuritic  chest pain  SOB (shortness of breath)    Rx / DC Orders ED Discharge Orders     None         Gwyneth Sprout, MD 01/07/24 1811

## 2024-01-17 ENCOUNTER — Other Ambulatory Visit: Payer: Self-pay | Admitting: Family Medicine

## 2024-02-12 ENCOUNTER — Other Ambulatory Visit: Payer: MEDICAID

## 2024-03-16 ENCOUNTER — Emergency Department (HOSPITAL_COMMUNITY)
Admission: EM | Admit: 2024-03-16 | Discharge: 2024-03-17 | Disposition: A | Discharge status: Home / Self Care | Payer: MEDICAID | Attending: Emergency Medicine | Admitting: Emergency Medicine

## 2024-03-16 ENCOUNTER — Other Ambulatory Visit: Payer: Self-pay

## 2024-03-16 ENCOUNTER — Emergency Department (HOSPITAL_COMMUNITY): Payer: MEDICAID

## 2024-03-16 DIAGNOSIS — Z992 Dependence on renal dialysis: Secondary | ICD-10-CM | POA: Insufficient documentation

## 2024-03-16 DIAGNOSIS — J449 Chronic obstructive pulmonary disease, unspecified: Secondary | ICD-10-CM | POA: Insufficient documentation

## 2024-03-16 DIAGNOSIS — R0602 Shortness of breath: Secondary | ICD-10-CM | POA: Diagnosis present

## 2024-03-16 DIAGNOSIS — I509 Heart failure, unspecified: Secondary | ICD-10-CM | POA: Diagnosis not present

## 2024-03-16 DIAGNOSIS — Z79899 Other long term (current) drug therapy: Secondary | ICD-10-CM | POA: Insufficient documentation

## 2024-03-16 DIAGNOSIS — N186 End stage renal disease: Secondary | ICD-10-CM | POA: Diagnosis not present

## 2024-03-16 LAB — CBC
HCT: 30.5 % — ABNORMAL LOW (ref 39.0–52.0)
Hemoglobin: 10.4 g/dL — ABNORMAL LOW (ref 13.0–17.0)
MCH: 26.3 pg (ref 26.0–34.0)
MCHC: 34.1 g/dL (ref 30.0–36.0)
MCV: 77 fL — ABNORMAL LOW (ref 80.0–100.0)
Platelets: 132 10*3/uL — ABNORMAL LOW (ref 150–400)
RBC: 3.96 MIL/uL — ABNORMAL LOW (ref 4.22–5.81)
RDW: 17.8 % — ABNORMAL HIGH (ref 11.5–15.5)
WBC: 9.9 10*3/uL (ref 4.0–10.5)
nRBC: 0.3 % — ABNORMAL HIGH (ref 0.0–0.2)

## 2024-03-16 LAB — COMPREHENSIVE METABOLIC PANEL WITH GFR
ALT: 78 U/L — ABNORMAL HIGH (ref 0–44)
AST: 45 U/L — ABNORMAL HIGH (ref 15–41)
Albumin: 3.3 g/dL — ABNORMAL LOW (ref 3.5–5.0)
Alkaline Phosphatase: 70 U/L (ref 38–126)
Anion gap: 17 — ABNORMAL HIGH (ref 5–15)
BUN: 78 mg/dL — ABNORMAL HIGH (ref 6–20)
CO2: 24 mmol/L (ref 22–32)
Calcium: 8.8 mg/dL — ABNORMAL LOW (ref 8.9–10.3)
Chloride: 96 mmol/L — ABNORMAL LOW (ref 98–111)
Creatinine, Ser: 10.98 mg/dL — ABNORMAL HIGH (ref 0.61–1.24)
GFR, Estimated: 5 mL/min — ABNORMAL LOW (ref >60)
Glucose, Bld: 140 mg/dL — ABNORMAL HIGH (ref 70–99)
Potassium: 4.6 mmol/L (ref 3.5–5.1)
Sodium: 137 mmol/L (ref 135–145)
Total Bilirubin: 0.6 mg/dL (ref 0.0–1.2)
Total Protein: 7.9 g/dL (ref 6.5–8.1)

## 2024-03-16 LAB — TROPONIN I (HIGH SENSITIVITY): Troponin I (High Sensitivity): 29 ng/L — ABNORMAL HIGH (ref <18)

## 2024-03-16 LAB — BRAIN NATRIURETIC PEPTIDE: B Natriuretic Peptide: 182.2 pg/mL — ABNORMAL HIGH (ref 0.0–100.0)

## 2024-03-16 NOTE — ED Provider Notes (Signed)
 [place] EMERGENCY DEPARTMENT AT [facility] Provider Note   CSN: [id number] Arrival date & time: 03/16/24  2010     History {Add pertinent medical, surgical, social history, OB history to HPI:1} Chief Complaint  Patient presents with   Shortness of Breath    Johnny [name] is a 63 y.o. male.  HPI     Home Medications Prior to Admission medications   Medication Sig Start Date End Date Taking? Authorizing Provider  albuterol  (VENTOLIN  HFA) 108 (90 Base) MCG/ACT inhaler Inhale 2 puffs into the lungs every 6 (six) hours as needed for wheezing or shortness of breath. Patient taking differently: Inhale 1 puff into the lungs every 6 (six) hours as needed for wheezing or shortness of breath. 12/01/21   Vada Garibaldi, MD  allopurinol  (ZYLOPRIM ) 100 MG tablet Take 1 tablet (100 mg total) by mouth daily. 12/18/23   Pokhrel, Laxman, MD  amLODipine  (NORVASC ) 10 MG tablet Take 1 tablet (10 mg total) by mouth daily. 01/02/24   Hassie Lint, PA-C  atorvastatin  (LIPITOR) 40 MG tablet Take 1 tablet (40 mg total) by mouth daily. 01/02/24   McClung, Stan Eans, PA-C  colchicine  0.6 MG tablet TAKE 2 TABLETS BY MOUTH AT THE ONSET OF A GOUT FLARE. MAY REPEAT 1 TABLET IN 1 HOUR IF SYMPTOMS PERSIST 01/17/24   Newlin, Enobong, MD  ipratropium-albuterol  (DUONEB) 0.5-2.5 (3) MG/3ML SOLN Inhale 3 mLs into the lungs every 6 (six) hours as needed. 12/18/23   Pokhrel, Laxman, MD  labetalol  (NORMODYNE ) 300 MG tablet Take 1 tablet by mouth twice daily 12/09/23   Newlin, Enobong, MD  levothyroxine  (SYNTHROID ) 100 MCG tablet Take 100 mcg by mouth daily. 10/15/23   [provider]  mometasone -formoterol  (DULERA ) 100-5 MCG/ACT AERO Inhale 2 puffs into the lungs 2 (two) times daily. 11/07/22   Hassie Lint, PA-C  mupirocin  ointment (BACTROBAN ) 2 % Apply 1 Application topically 2 (two) times daily. Patient taking differently: Apply 1 Application topically as needed (rash on hand). 11/07/22   Hassie Lint, PA-C  sodium bicarbonate  650 MG tablet Take 650 mg by mouth 2 (two) times daily. 09/06/23   [provider]  torsemide  (DEMADEX ) 100 MG tablet Take 1 tablet (100 mg total) by mouth daily. 01/02/24 06/30/24  McClung, Angela M, PA-C  triamcinolone  cream (KENALOG ) 0.1 % APPLY CREAM EXTERNALLY TWICE DAILY Patient taking differently: Apply 1 Application topically as needed (rash on hand). 10/23/23   Newlin, Enobong, MD      Allergies    Heparin , Naproxen, and Shellfish allergy    Review of Systems   Review of Systems  Physical Exam Updated Vital Signs BP 127/82   Pulse 87   Temp 98.7 F (37.1 C)   Resp (!) 22   Wt 77.6 kg   SpO2 100%   BMI 23.20 kg/m  Physical Exam  ED Results / Procedures / Treatments   Labs (all labs ordered are listed, but only abnormal results are displayed) Labs Reviewed  CBC - Abnormal; Notable for the following components:      Result Value   RBC 3.96 (*)    Hemoglobin 10.4 (*)    HCT 30.5 (*)    MCV 77.0 (*)    RDW 17.8 (*)    Platelets 132 (*)    nRBC 0.3 (*)    All other components within normal limits  BRAIN NATRIURETIC PEPTIDE - Abnormal; Notable for the following components:   B Natriuretic Peptide 182.2 (*)  All other components within normal limits  COMPREHENSIVE METABOLIC PANEL WITH GFR - Abnormal; Notable for the following components:   Chloride 96 (*)    Glucose, Bld 140 (*)    BUN 78 (*)    Creatinine, Ser 10.98 (*)    Calcium  8.8 (*)    Albumin  3.3 (*)    AST 45 (*)    ALT 78 (*)    GFR, Estimated 5 (*)    Anion gap 17 (*)    All other components within normal limits  TROPONIN I (HIGH SENSITIVITY) - Abnormal; Notable for the following components:   Troponin I (High Sensitivity) 29 (*)    All other components within normal limits  TROPONIN I (HIGH SENSITIVITY)    EKG None  Radiology DG Chest 2 View Result Date: 03/16/2024 CLINICAL DATA:  Shortness of breath EXAM: CHEST - 2 VIEW COMPARISON:  01/07/2024  FINDINGS: Cardiac shadow is enlarged similar to that seen on the prior exam. Mild vascular congestion is again seen. No sizable effusion or edema is noted. No bony abnormality is seen. IMPRESSION: Stable cardiomegaly and mild vascular congestion. Electronically Signed   By: Violeta Grey M.D.   On: 03/16/2024 21:16    Procedures Procedures  {Document cardiac monitor, telemetry assessment procedure when appropriate:1}  Medications Ordered in ED Medications - No data to display  ED Course/ Medical Decision Making/ A&P   {   Click here for ABCD2, HEART and other calculatorsREFRESH Note before signing :1}                              Medical Decision Making Amount and/or Complexity of Data Reviewed Labs: ordered. Radiology: ordered.   ***  {Document critical care time when appropriate:1} {Document review of labs and clinical decision tools ie heart score, Chads2Vasc2 etc:1}  {Document your independent review of radiology images, and any outside records:1} {Document your discussion with family members, caretakers, and with consultants:1} {Document social determinants of health affecting pt's care:1} {Document your decision making why or why not admission, treatments were needed:1} Final Clinical Impression(s) / ED Diagnoses Final diagnoses:  None    Rx / DC Orders ED Discharge Orders     None

## 2024-03-16 NOTE — ED Triage Notes (Signed)
 Pt states he has consumed to many fluids and thinks he overloaded himself

## 2024-03-16 NOTE — Discharge Instructions (Signed)
 You were seen for your shortness of breath in the emergency department.   At home, please go to dialysis this week.    Follow-up with your primary doctor in 2-3 days regarding your visit.    Return immediately to the emergency department if you experience any of the following: Difficulty breathing, or any other concerning symptoms.    Thank you for visiting our Emergency Department. It was a pleasure taking care of you today.

## 2024-03-16 NOTE — ED Triage Notes (Signed)
 BIB EMS from home. SOB. Dialysis pt per ems crackles in lower lobes. T, TH Sat dialysis and has not missed any treatment. Had duoneb and home with no relief.   EMS VS 118/80 76 hr 100% RA

## 2024-03-16 NOTE — ED Provider Triage Note (Signed)
 Emergency Medicine Provider Triage Evaluation Note  Johnny [name] , a 63 y.o. male  was evaluated in triage.  Pt complains of shortness of breath and chest pain that started earlier today.  States he drank too much water and now feels that he is on his lungs.  He is on dialysis.  Last completed session was this past Saturday.  He is due for dialysis in the morning..  Review of Systems  Positive:  Negative:   Physical Exam  BP 111/77   Pulse 76   Temp 98.7 F (37.1 C)   Resp 14   Wt 77.6 kg   SpO2 98%   BMI 23.20 kg/m  Gen:   Awake, no distress   Resp:  Normal effort, bibasilar Rales without wheezes MSK:   Moves extremities without difficulty  Other:    Medical Decision Making  Medically screening exam initiated at 8:37 PM.  Appropriate orders placed.  DELLA SZAKACS was informed that the remainder of the evaluation will be completed by another provider, this initial triage assessment does not replace that evaluation, and the importance of remaining in the ED until their evaluation is complete.     Felicie Horning, PA-C 03/16/24 2043

## 2024-03-23 ENCOUNTER — Other Ambulatory Visit: Payer: Self-pay | Admitting: Physician Assistant

## 2024-03-23 ENCOUNTER — Telehealth: Payer: Self-pay

## 2024-03-23 DIAGNOSIS — N6342 Unspecified lump in left breast, subareolar: Secondary | ICD-10-CM

## 2024-03-23 NOTE — Telephone Encounter (Signed)
 Copied from CRM [id number]. Topic: Appointments - Scheduling Inquiry for Clinic >> Mar 23, 2024 12:43 PM DeAngela L wrote: Reason for CRM: Patient missed appt on 02/12/24 and would like to get rescheduled with whom the appt was with and the patient is not sure where to reschedule and would like to ask if Dr Adan Holms would know who the appt was with  Patient [phone] ([phone]) Patient would like to speak with Dr Newlin if possible he has additional questions

## 2024-03-23 NOTE — Telephone Encounter (Signed)
 Called patient, Patient did not answer. Unable to leave voicemail due to mailbox being full.

## 2024-03-23 NOTE — Telephone Encounter (Signed)
 Patient is calling back.

## 2024-03-23 NOTE — Telephone Encounter (Signed)
 Noted

## 2024-03-23 NOTE — Telephone Encounter (Signed)
 Called & spoke to the patient. Verified name & DOB. Patient did not have an appointment in our office. Patient had an appointment 02/12/2024 at [id number]. Gave the patient the office phone # to call and reschedule the appointment. Patient acknowledged and had no further questions.

## 2024-03-31 ENCOUNTER — Ambulatory Visit: Payer: Self-pay

## 2024-03-31 NOTE — Telephone Encounter (Signed)
 Copied from CRM [id number]. Topic: Clinical - Red Word Triage >> Mar 31, 2024  1:30 PM Rennis Case wrote: Red Word that prompted transfer to Nurse Triage: Pt had hot sweats on Sunday. Felt like something in his chest was moving, pt had lightheadeness.   Chief Complaint: labile BP, symptomatic Symptoms: elevated BP 155-160/101 then dialysis and BP 109/60, BP been getting to 90-100 systolic lately, having to skip BP med doses, intermittent lightheadedness, intermittent sweating, intermittent nausea, intermittent palpitations "like a little earthquake," irregular heartbeats, SOB this morning requiring breathing treatment Frequency: continual Pertinent Negatives: Patient denies chest pain, numbness, weakness Disposition: [] 911 / [] ED /[] Urgent Care (no appt availability in office) / [] Appointment(In office/virtual)/ []  [facility]/ [] Home Care/ [x] Refused Recommended Disposition /[] [facility]/ []  Follow-up with PCP Additional Notes: Pt reporting that he has hx of high BP but his BP has been running low for the past couple weeks, then on Sunday he had sweating and lightheadedness to the point of feeling faint, has had intermittent nausea and heart palpitations, irregular/abnormal heartbeat, and had SOB this morning requiring a breathing treatment which he only does when "feel like something is going on." Pt reporting that his BP was elevated 155-160/101 before dialysis today then after dialysis BP 109/60 with lightheadedness. Advised pt get examined in hospital asap, call 911 or have another adult drive him to ED immediately. Pt verbalized understanding, stated "if not able to go today," he will go tomorrow first-thing. Advised strongly pt go asap, especially if any worsening. Pt verbalized understanding. Please advise.  Reason for Disposition  Sounds like a life-threatening emergency to the triager  Answer Assessment - Initial Assessment Questions 1. BLOOD PRESSURE: "What is the blood  pressure?" "Did you take at least two measurements 5 minutes apart?"     BP for last 2 weeks been running low, stopped taking my BP meds, one day did take 1/4 of med and when did dialysis, dropped it too low again, high BP 155-160/101 when went to dialysis, no med then end of session 109/60. Not gotten lower than that in last couple weeks, lowest it's gotten 90-100 top number maybe at one time 4. HISTORY: "Do you have a history of high blood pressure?"     yes 5. MEDICINES: "Are you taking any medicines for blood pressure?" "Have you missed any doses recently?"     Been stopping meds due to BP being too low to take it, usually dialysis has him wait til he gets home to take his BP med to give it a chance to come back up but not even been able to do that 6. OTHER SYMPTOMS: "Do you have any symptoms?" (e.g., blurred vision, chest pain, difficulty breathing, headache, weakness)     Sunday got extremely hot, then got to church and had to turn on fan, beading sweat, had to sit down with the mic because felt like was going to pass out, had to lay down and drink water Nausea few days ago 1x Felt like something in chest was moving like a little earthquake Abnormal beats for period of time, sometimes beats irregularly Semi respiratory issues, this morning before went in did breathing treatment only do if feel like something going on Lightheaded after dialysis when BP was low  Protocols used: Blood Pressure - High-A-AH

## 2024-03-31 NOTE — Telephone Encounter (Signed)
Disposition noted.

## 2024-04-02 ENCOUNTER — Ambulatory Visit: Payer: Self-pay

## 2024-04-02 ENCOUNTER — Other Ambulatory Visit: Payer: Self-pay

## 2024-04-02 ENCOUNTER — Inpatient Hospital Stay (HOSPITAL_COMMUNITY)
Admission: EM | Admit: 2024-04-02 | Discharge: 2024-04-03 | DRG: 308 | Disposition: A | Discharge status: Home / Self Care | Payer: MEDICAID | Attending: Internal Medicine | Admitting: Internal Medicine

## 2024-04-02 ENCOUNTER — Emergency Department (HOSPITAL_COMMUNITY): Payer: MEDICAID

## 2024-04-02 ENCOUNTER — Telehealth: Payer: Self-pay

## 2024-04-02 ENCOUNTER — Encounter (HOSPITAL_COMMUNITY): Payer: Self-pay

## 2024-04-02 DIAGNOSIS — I483 Typical atrial flutter: Secondary | ICD-10-CM | POA: Diagnosis present

## 2024-04-02 DIAGNOSIS — Z820 Family history of epilepsy and other diseases of the nervous system: Secondary | ICD-10-CM | POA: Diagnosis not present

## 2024-04-02 DIAGNOSIS — Z8261 Family history of arthritis: Secondary | ICD-10-CM

## 2024-04-02 DIAGNOSIS — Z841 Family history of disorders of kidney and ureter: Secondary | ICD-10-CM | POA: Diagnosis not present

## 2024-04-02 DIAGNOSIS — M109 Gout, unspecified: Secondary | ICD-10-CM | POA: Diagnosis present

## 2024-04-02 DIAGNOSIS — I10 Essential (primary) hypertension: Secondary | ICD-10-CM | POA: Diagnosis present

## 2024-04-02 DIAGNOSIS — D6869 Other thrombophilia: Secondary | ICD-10-CM | POA: Diagnosis present

## 2024-04-02 DIAGNOSIS — D631 Anemia in chronic kidney disease: Secondary | ICD-10-CM | POA: Diagnosis present

## 2024-04-02 DIAGNOSIS — I132 Hypertensive heart and chronic kidney disease with heart failure and with stage 5 chronic kidney disease, or end stage renal disease: Secondary | ICD-10-CM | POA: Diagnosis present

## 2024-04-02 DIAGNOSIS — I5032 Chronic diastolic (congestive) heart failure: Secondary | ICD-10-CM | POA: Diagnosis present

## 2024-04-02 DIAGNOSIS — I4892 Unspecified atrial flutter: Secondary | ICD-10-CM | POA: Diagnosis present

## 2024-04-02 DIAGNOSIS — E039 Hypothyroidism, unspecified: Secondary | ICD-10-CM | POA: Diagnosis present

## 2024-04-02 DIAGNOSIS — Z79899 Other long term (current) drug therapy: Secondary | ICD-10-CM

## 2024-04-02 DIAGNOSIS — Z91013 Allergy to seafood: Secondary | ICD-10-CM

## 2024-04-02 DIAGNOSIS — Z992 Dependence on renal dialysis: Secondary | ICD-10-CM

## 2024-04-02 DIAGNOSIS — Z79890 Hormone replacement therapy (postmenopausal): Secondary | ICD-10-CM | POA: Diagnosis not present

## 2024-04-02 DIAGNOSIS — N186 End stage renal disease: Secondary | ICD-10-CM | POA: Diagnosis present

## 2024-04-02 DIAGNOSIS — Z860101 Personal history of adenomatous and serrated colon polyps: Secondary | ICD-10-CM

## 2024-04-02 DIAGNOSIS — J449 Chronic obstructive pulmonary disease, unspecified: Secondary | ICD-10-CM | POA: Diagnosis present

## 2024-04-02 DIAGNOSIS — Z87891 Personal history of nicotine dependence: Secondary | ICD-10-CM | POA: Diagnosis not present

## 2024-04-02 DIAGNOSIS — G4733 Obstructive sleep apnea (adult) (pediatric): Secondary | ICD-10-CM | POA: Diagnosis present

## 2024-04-02 DIAGNOSIS — I5030 Unspecified diastolic (congestive) heart failure: Secondary | ICD-10-CM | POA: Diagnosis present

## 2024-04-02 DIAGNOSIS — Z833 Family history of diabetes mellitus: Secondary | ICD-10-CM

## 2024-04-02 DIAGNOSIS — Z8249 Family history of ischemic heart disease and other diseases of the circulatory system: Secondary | ICD-10-CM

## 2024-04-02 DIAGNOSIS — Z886 Allergy status to analgesic agent status: Secondary | ICD-10-CM

## 2024-04-02 DIAGNOSIS — Z7951 Long term (current) use of inhaled steroids: Secondary | ICD-10-CM

## 2024-04-02 DIAGNOSIS — I4510 Unspecified right bundle-branch block: Secondary | ICD-10-CM | POA: Diagnosis present

## 2024-04-02 DIAGNOSIS — E785 Hyperlipidemia, unspecified: Secondary | ICD-10-CM | POA: Diagnosis present

## 2024-04-02 LAB — COMPREHENSIVE METABOLIC PANEL WITH GFR
ALT: 16 U/L (ref 0–44)
AST: 20 U/L (ref 15–41)
Albumin: 3 g/dL — ABNORMAL LOW (ref 3.5–5.0)
Alkaline Phosphatase: 61 U/L (ref 38–126)
Anion gap: 12 (ref 5–15)
BUN: 19 mg/dL (ref 8–23)
CO2: 32 mmol/L (ref 22–32)
Calcium: 8.3 mg/dL — ABNORMAL LOW (ref 8.9–10.3)
Chloride: 97 mmol/L — ABNORMAL LOW (ref 98–111)
Creatinine, Ser: 4.68 mg/dL — ABNORMAL HIGH (ref 0.61–1.24)
GFR, Estimated: 13 mL/min — ABNORMAL LOW (ref >60)
Glucose, Bld: 108 mg/dL — ABNORMAL HIGH (ref 70–99)
Potassium: 3.5 mmol/L (ref 3.5–5.1)
Sodium: 141 mmol/L (ref 135–145)
Total Bilirubin: 0.7 mg/dL (ref 0.0–1.2)
Total Protein: 8.4 g/dL — ABNORMAL HIGH (ref 6.5–8.1)

## 2024-04-02 LAB — CBC WITH DIFFERENTIAL/PLATELET
Abs Immature Granulocytes: 0.03 10*3/uL (ref 0.00–0.07)
Basophils Absolute: 0.1 10*3/uL (ref 0.0–0.1)
Basophils Relative: 1 %
Eosinophils Absolute: 0.2 10*3/uL (ref 0.0–0.5)
Eosinophils Relative: 2 %
HCT: 33.6 % — ABNORMAL LOW (ref 39.0–52.0)
Hemoglobin: 11.3 g/dL — ABNORMAL LOW (ref 13.0–17.0)
Immature Granulocytes: 0 %
Lymphocytes Relative: 18 %
Lymphs Abs: 1.5 10*3/uL (ref 0.7–4.0)
MCH: 26.3 pg (ref 26.0–34.0)
MCHC: 33.6 g/dL (ref 30.0–36.0)
MCV: 78.1 fL — ABNORMAL LOW (ref 80.0–100.0)
Monocytes Absolute: 1.1 10*3/uL — ABNORMAL HIGH (ref 0.1–1.0)
Monocytes Relative: 13 %
Neutro Abs: 5.5 10*3/uL (ref 1.7–7.7)
Neutrophils Relative %: 66 %
Platelets: 225 10*3/uL (ref 150–400)
RBC: 4.3 MIL/uL (ref 4.22–5.81)
RDW: 18.7 % — ABNORMAL HIGH (ref 11.5–15.5)
WBC: 8.4 10*3/uL (ref 4.0–10.5)
nRBC: 0 % (ref 0.0–0.2)

## 2024-04-02 LAB — MAGNESIUM: Magnesium: 2 mg/dL (ref 1.7–2.4)

## 2024-04-02 LAB — TSH: TSH: 1.546 u[IU]/mL (ref 0.350–4.500)

## 2024-04-02 LAB — TROPONIN I (HIGH SENSITIVITY)
Troponin I (High Sensitivity): 40 ng/L — ABNORMAL HIGH (ref <18)
Troponin I (High Sensitivity): 39 ng/L — ABNORMAL HIGH (ref <18)

## 2024-04-02 LAB — BRAIN NATRIURETIC PEPTIDE: B Natriuretic Peptide: 1951.6 pg/mL — ABNORMAL HIGH (ref 0.0–100.0)

## 2024-04-02 MED ORDER — TORSEMIDE 20 MG PO TABS: 100.0000 mg | ORAL_TABLET | Freq: Every day | ORAL | Status: DC

## 2024-04-02 MED ORDER — IPRATROPIUM-ALBUTEROL 0.5-2.5 (3) MG/3ML IN SOLN
3.0000 mL | Freq: Four times a day (QID) | RESPIRATORY_TRACT | Status: DC | PRN
  Administered 2024-04-03: 3 mL via RESPIRATORY_TRACT
  Filled 2024-04-02: qty 3

## 2024-04-02 MED ORDER — ALLOPURINOL 100 MG PO TABS: 100.0000 mg | ORAL_TABLET | ORAL | Status: DC

## 2024-04-02 MED ORDER — LEVOTHYROXINE SODIUM 88 MCG PO TABS
88.0000 ug | ORAL_TABLET | Freq: Every day | ORAL | Status: DC
  Administered 2024-04-03: 88 ug via ORAL
  Filled 2024-04-02: qty 1

## 2024-04-02 MED ORDER — TRIAMCINOLONE ACETONIDE 0.1 % EX CREA: 1.0000 | TOPICAL_CREAM | CUTANEOUS | Status: DC | PRN

## 2024-04-02 MED ORDER — POTASSIUM CHLORIDE CRYS ER 20 MEQ PO TBCR
40.0000 meq | EXTENDED_RELEASE_TABLET | Freq: Once | ORAL | Status: AC
  Administered 2024-04-02: 40 meq via ORAL
  Filled 2024-04-02: qty 2

## 2024-04-02 MED ORDER — DILTIAZEM HCL-DEXTROSE 125-5 MG/125ML-% IV SOLN (PREMIX)
5.0000 mg/h | INTRAVENOUS | Status: AC
  Administered 2024-04-02: 10 mg/h via INTRAVENOUS
  Administered 2024-04-02: 5 mg/h via INTRAVENOUS
  Filled 2024-04-02 (×2): qty 125

## 2024-04-02 MED ORDER — LEVOTHYROXINE SODIUM 88 MCG PO TABS: 88.0000 ug | ORAL_TABLET | Freq: Every day | ORAL | Status: DC

## 2024-04-02 MED ORDER — DILTIAZEM LOAD VIA INFUSION
15.0000 mg | Freq: Once | INTRAVENOUS | Status: AC
  Administered 2024-04-02: 15 mg via INTRAVENOUS
  Filled 2024-04-02: qty 15

## 2024-04-02 MED ORDER — FLUTICASONE FUROATE-VILANTEROL 100-25 MCG/ACT IN AEPB
1.0000 | INHALATION_SPRAY | Freq: Every day | RESPIRATORY_TRACT | Status: DC
  Filled 2024-04-02: qty 28

## 2024-04-02 MED ORDER — POLYETHYLENE GLYCOL 3350 17 G PO PACK: 17.0000 g | PACK | Freq: Every day | ORAL | Status: DC | PRN

## 2024-04-02 MED ORDER — ATORVASTATIN CALCIUM 40 MG PO TABS
40.0000 mg | ORAL_TABLET | Freq: Every day | ORAL | Status: DC
  Administered 2024-04-03: 40 mg via ORAL
  Filled 2024-04-02: qty 1

## 2024-04-02 MED ORDER — TORSEMIDE 20 MG PO TABS
100.0000 mg | ORAL_TABLET | Freq: Every day | ORAL | Status: DC
  Administered 2024-04-02 – 2024-04-03 (×2): 100 mg via ORAL
  Filled 2024-04-02 (×2): qty 5

## 2024-04-02 NOTE — ED Notes (Signed)
 CCMD notified via telephone.

## 2024-04-02 NOTE — Telephone Encounter (Signed)
  Chief Complaint: High HR Symptoms: HR 140, episodes of sweating and not feeling well Frequency: since Sunday Pertinent Negatives: Patient denies CP and SOB Disposition: [x]ED /[]Urgent Care (no appt availability in office) / []Appointment(In office/virtual)/ [] [facility]/ []Home Care/ []Refused Recommended Disposition /[][facility]/ [] Follow-up with PCP Additional Notes: patient calling from dialysis with reports of HR of 140-patient states he does feel like his HR is high. Patient reports feeling poorly the last couple of days. Patient reports sudden episodes of sweating. Denies chest pain or shortness of breath at this time. Patient recommended to the ED for evaluation. Patient verbalized understanding-patient did ask if he could get a call from PCP. Instructed note would be sent over to office but no time was given for call back. All questions answered.    Copied from CRM #[id number]. Topic: Clinical - Red Word Triage >> Apr 02, 2024  9:38 AM Jakeisha S wrote: Red Word that prompted transfer to Nurse Triage: Blood pressure: 135/104 Heart rate: 140 Patient is currently at Dialysis Reason for Disposition  [1] Heart beating very rapidly (e.g., > 140 / minute) AND [2] present now  (Exception: During exercise.)  Answer Assessment - Initial Assessment Questions 1. DESCRIPTION: "Please describe your heart rate or heartbeat that you are having" (e.g., fast/slow, regular/irregular, skipped or extra beats, "palpitations")     Fast heart rate 2. ONSET: "When did it start?" (Minutes, hours or days)      Started a few days ago 3. DURATION: "How long does it last" (e.g., seconds, minutes, hours)      4. PATTERN "Does it come and go, or has it been constant since it started?"  "Does it get worse with exertion?"   "Are you feeling it now?"     constant 5. TAP: "Using your hand, can you tap out what you are feeling on a chair or table in front of you, so that I can hear?" (Note:  not all patients can do this)       N/A 6. HEART RATE: "Can you tell me your heart rate?" "How many beats in 15 seconds?"  (Note: not all patients can do this)       14 0-patient  7. RECURRENT SYMPTOM: "Have you ever had this before?" If Yes, ask: "When was the last time?" and "What happened that time?"      Going on for the last couple of days 8. CAUSE: "What do you think is causing the palpitations?"     Unsure 9. CARDIAC HISTORY: "Do you have any history of heart disease?" (e.g., heart attack, angina, bypass surgery, angioplasty, arrhythmia)      CHF 10. OTHER SYMPTOMS: "Do you have any other symptoms?" (e.g., dizziness, chest pain, sweating, difficulty breathing)       Sweating at times  Protocols used: Heart Rate and Heartbeat Questions-A-AH

## 2024-04-02 NOTE — Telephone Encounter (Signed)
-----   Message from Josselin S sent at 04/02/2024 11:07 AM EDT ----- Pt returning your call. Pt states he was at dialysis. Pt asking to please return call when possible.

## 2024-04-02 NOTE — Telephone Encounter (Signed)
 Patient call was returned and he states that he is currently in the ED.

## 2024-04-02 NOTE — ED Provider Notes (Signed)
 [facility] EM[facility]MENT AT [facility] Provider Note  CSN: [id number] Arrival date & time: 04/02/24 1151  Chief Complaint(s) Tachycardia  HPI Johnny [name] is a 63 y.o. male history of COPD, CHF, end-stage renal disease on dialysis presenting to the emergency department with palpitations.  Reports that he developed palpitations and fast heart rate since Sunday, not sure if it comes and goes or not.  He reports that he was feeling a little bit more weak than normal but otherwise no chest pain, shortness of breath, leg swelling, abdominal pain.  Has been compliant with dialysis, last 1 today and had full session, they advised he should come to the ER given his tachycardia.   Past Medical History Past Medical History:  Diagnosis Date   Adenomatous colon polyp    tubular   Anemia    Asthma    as a child   CHF (congestive heart failure) (HCC)    Chronic kidney disease    COPD (chronic obstructive pulmonary disease) (HCC)    Diverticulosis    External otitis of right ear 11/10/2018   Gout    Hepatitis C    HLD (hyperlipidemia)    Hypertension    OSA (obstructive sleep apnea) 11/03/2015   Sleep apnea    Strep throat 01/2021   Thyroid  disorder    Patient Active Problem List   Diagnosis Date Noted   Atrial flutter with rapid ventricular response (HCC) 04/02/2024   Symptomatic anemia 12/16/2023   Hypertensive urgency 12/16/2023   Acute heart failure with preserved ejection fraction (HFpEF) (HCC) 11/29/2023   Anemia due to chronic kidney disease 11/29/2023   Fluid overload 09/20/2022   Group A streptococcal infection 06/05/2022   COPD exacerbation (HCC) 05/23/2022   AKI (acute kidney injury) (HCC) 11/29/2021   Toenail fungus 06/11/2019   Homelessness 06/11/2019   Other atopic dermatitis 06/11/2019   Hypothyroidism 06/11/2019   Eczema of hand 11/10/2018   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 Authorizing Provider  albuterol  (VENTOLIN  HFA) 108 (90 Base) MCG/ACT inhaler Inhale 2 puffs into the lungs every 6 (six) hours as needed for wheezing or shortness of breath. Patient taking differently: Inhale 1 puff into the lungs every 6 (six) hours as needed for wheezing or shortness of breath. 12/01/21  Yes Ghimire, Letty Raya, MD  atorvastatin  (LIPITOR) 40 MG tablet Take 1 tablet (40 mg total) by mouth daily. 01/02/24  Yes McClung, Stan Eans, PA-C  colchicine  0.6 MG tablet TAKE 2 TABLETS BY MOUTH AT THE ONSET OF A GOUT FLARE. MAY REPEAT 1 TABLET IN 1 HOUR IF SYMPTOMS PERSIST 01/17/24  Yes Newlin, Enobong, MD  ipratropium-albuterol  (DUONEB) 0.5-2.5 (3) MG/3ML SOLN Inhale 3 mLs into the lungs every 6 (six) hours as needed. 12/18/23  Yes Pokhrel, Laxman, MD  levothyroxine  (SYNTHROID ) 88 MCG tablet Take 88 mcg by mouth daily.   Yes [provider]  torsemide  (DEMADEX ) 100 MG tablet Take 1 tablet (100 mg total) by mouth  daily. 01/02/24 06/30/24 Yes McClung, Stan Eans, PA-C  triamcinolone  cream (KENALOG ) 0.1 % APPLY CREAM EXTERNALLY TWICE DAILY Patient taking differently: Apply 1 Application topically as needed (rash on hand). 10/23/23  Yes Newlin, Enobong, MD  amLODipine  (NORVASC ) 10 MG tablet Take 1 tablet (10 mg total) by mouth daily. Patient not taking: Reported on 04/02/2024 01/02/24   Hassie Lint, PA-C  labetalol  (NORMODYNE ) 300 MG tablet Take 1 tablet by mouth twice daily Patient not taking: Reported on  04/02/2024 12/09/23   Newlin, Enobong, MD                                                                                                                                    Past Surgical History Past Surgical History:  Procedure Laterality Date   A/V FISTULAGRAM N/A 11/08/2023   Procedure: A/V Fistulagram;  Surgeon: Carlene Che, MD;  Location: [facility] INVASIVE CV LAB;  Service: Vascular;  Laterality: N/A;   AV FISTULA PLACEMENT  09/12/2009   Left arm AVF   Family History Family History  Problem Relation Age of Onset   Hypertension Mother    Diabetes Mother    Heart disease Mother    Alzheimer's disease Mother    Arthritis Mother    Anemia Father    Kidney disease Brother    Colon cancer Neg Hx    Colon polyps Neg Hx    Esophageal cancer Neg Hx    Rectal cancer Neg Hx    Stomach cancer Neg Hx     Social History Social History   Tobacco Use   Smoking status: Former    Current packs/day: 0.00    Types: Cigarettes    Quit date: 04/02/2011    Years since quitting: 13.0   Smokeless tobacco: Never   Tobacco comments:    trying to quit  Vaping Use   Vaping status: Never Used  Substance Use Topics   Alcohol use: Never    Alcohol/week: 0.0 standard drinks of alcohol   Drug use: No   Allergies Aleve [naproxen], Heparin , and Shellfish allergy  Review of Systems Review of Systems  All other systems reviewed and are negative.   Physical Exam Vital Signs  I have reviewed the triage vital signs BP 124/86   Pulse 74   Temp 98.6 F (37 C) (Oral)   Resp (!) 25   SpO2 100%  Physical Exam Vitals and nursing note reviewed.  Constitutional:      General: He is not in acute distress.    Appearance: Normal appearance.  HENT:     Mouth/Throat:     Mouth: Mucous membranes are moist.  Eyes:     Conjunctiva/sclera: Conjunctivae normal.  Cardiovascular:     Rate and Rhythm: Regular rhythm. Tachycardia present.  Pulmonary:     Effort: Pulmonary effort is normal. No respiratory  distress.     Breath sounds: Normal breath sounds.  Abdominal:  General: Abdomen is flat.     Palpations: Abdomen is soft.     Tenderness: There is no abdominal tenderness.  Musculoskeletal:     Right lower leg: No edema.     Left lower leg: No edema.  Skin:    General: Skin is warm and dry.     Capillary Refill: Capillary refill takes less than 2 seconds.  Neurological:     Mental Status: He is alert and oriented to person, place, and time. Mental status is at baseline.  Psychiatric:        Mood and Affect: Mood normal.        Behavior: Behavior normal.     ED Results and Treatments Labs (all labs ordered are listed, but only abnormal results are displayed) Labs Reviewed  COMPREHENSIVE METABOLIC PANEL WITH GFR - Abnormal; Notable for the following components:      Result Value   Chloride 97 (*)    Glucose, Bld 108 (*)    Creatinine, Ser 4.68 (*)    Calcium  8.3 (*)    Total Protein 8.4 (*)    Albumin  3.0 (*)    GFR, Estimated 13 (*)    All other components within normal limits  CBC WITH DIFFERENTIAL/PLATELET - Abnormal; Notable for the following components:   Hemoglobin 11.3 (*)    HCT 33.6 (*)    MCV 78.1 (*)    RDW 18.7 (*)    Monocytes Absolute 1.1 (*)    All other components within normal limits  TROPONIN I (HIGH SENSITIVITY) - Abnormal; Notable for the following components:   Troponin I (High Sensitivity) 40 (*)    All other components within normal limits  TROPONIN I (HIGH SENSITIVITY) - Abnormal; Notable for the following components:   Troponin I (High Sensitivity) 39 (*)    All other components within normal limits  MAGNESIUM   TSH  BRAIN NATRIURETIC PEPTIDE                                                                                                                          Radiology DG Chest Portable 1 View Result Date: 04/02/2024 CLINICAL DATA:  Fatigue EXAM: PORTABLE CHEST 1 VIEW COMPARISON:  03/16/2024, 12/16/2023, 09/20/2022 FINDINGS:  Cardiomegaly with globular cardiac enlargement. Mild central vascular congestion. Probable small pleural effusions. Mild atelectasis at the left base. IMPRESSION: Cardiomegaly with mild central vascular congestion and probable small pleural effusions. Globular cardiac enlargement which may be due to multi chamber enlargement with pericardial effusion not excluded Electronically Signed   By: Esmeralda Hedge M.D.   On: 04/02/2024 15:35    Pertinent labs & imaging results that were available during my care of the patient were reviewed by me and considered in my medical decision making (see MDM for details).  Medications Ordered in ED Medications  diltiazem  (CARDIZEM ) 1 mg/mL load via infusion 15 mg (15 mg Intravenous Bolus from Bag 04/02/24 1338)    And  diltiazem  (CARDIZEM ) 125 mg in dextrose  5%  125 mL (1 mg/mL) infusion (12.5 mg/hr Intravenous Rate/Dose Change 04/02/24 1557)  polyethylene glycol (MIRALAX / GLYCOLAX) packet 17 g (has no administration in time range)  atorvastatin  (LIPITOR) tablet 40 mg (has no administration in time range)  ipratropium-albuterol  (DUONEB) 0.5-2.5 (3) MG/3ML nebulizer solution 3 mL (has no administration in time range)  triamcinolone  cream (KENALOG ) 0.1 % cream 1 Application (has no administration in time range)  torsemide  (DEMADEX ) tablet 100 mg (has no administration in time range)  levothyroxine  (SYNTHROID ) tablet 88 mcg (has no administration in time range)                                                                                                                                     Procedures .Critical Care  Performed by: Mordecai Applebaum, MD Authorized by: Mordecai Applebaum, MD   Critical care provider statement:    Critical care time (minutes):  30   Critical care was necessary to treat or prevent imminent or life-threatening deterioration of the following conditions:  Cardiac failure   Critical care was time spent personally by me on the following  activities:  Development of treatment plan with patient or surrogate, discussions with consultants, evaluation of patient's response to treatment, examination of patient, ordering and review of laboratory studies, ordering and review of radiographic studies, ordering and performing treatments and interventions, pulse oximetry, re-evaluation of patient's condition and review of old charts   Care discussed with: admitting provider     (including critical care time)  Medical Decision Making / ED Course   MDM:  63 year old presenting to the emergency department with palpitations.  Patient is overall quite well-appearing.  His EKG does appear to show atrial flutter with variable rate.  Patient reports symptoms seem to have started on Sunday.  Unfortunately outside window for cardioversion as patient is not on chronic anticoagulation.  Given his complicated medical history with history of CHF, COPD, dialysis dependent, think he will benefit from inpatient admission.  Patient started on diltiazem  drip with improvement.  He denies any fevers or chills, chest pain, shortness of breath, or any other underlying condition to suggest specific trigger of his atrial flutter.  Received diltiazem  and feels much better afterwards.  Patient has been admitted to the internal medicine service      Additional history obtained: -Additional history obtained from ems -External records from outside source obtained and reviewed including: Chart review including previous notes, labs, imaging, consultation notes including prior notes    Lab Tests: -I ordered, reviewed, and interpreted labs.   The pertinent results include:   Labs Reviewed  COMPREHENSIVE METABOLIC PANEL WITH GFR - Abnormal; Notable for the following components:      Result Value   Chloride 97 (*)    Glucose, Bld 108 (*)    Creatinine, Ser 4.68 (*)    Calcium  8.3 (*)    Total Protein 8.4 (*)    Albumin   3.0 (*)    GFR, Estimated 13 (*)    All  other components within normal limits  CBC WITH DIFFERENTIAL/PLATELET - Abnormal; Notable for the following components:   Hemoglobin 11.3 (*)    HCT 33.6 (*)    MCV 78.1 (*)    RDW 18.7 (*)    Monocytes Absolute 1.1 (*)    All other components within normal limits  TROPONIN I (HIGH SENSITIVITY) - Abnormal; Notable for the following components:   Troponin I (High Sensitivity) 40 (*)    All other components within normal limits  TROPONIN I (HIGH SENSITIVITY) - Abnormal; Notable for the following components:   Troponin I (High Sensitivity) 39 (*)    All other components within normal limits  MAGNESIUM   TSH  BRAIN NATRIURETIC PEPTIDE    Notable for mild troponin elevation likely demand related   EKG   EKG Interpretation Date/Time:  Thursday Apr 02 2024 12:34:08 EDT Ventricular Rate:  139 PR Interval:  148 QRS Duration:  160 QT Interval:  373 QTC Calculation: 568 R Axis:   48  Text Interpretation: Atrial flutter with varied AV block, Right bundle branch block Probable left ventricular hypertrophy Prolonged QT interval Confirmed by Hiawatha Lout ([id number]) on 04/02/2024 5:01:41 PM         Medicines ordered and prescription drug management: Meds ordered this encounter  Medications   AND Linked Order Group    diltiazem  (CARDIZEM ) 1 mg/mL load via infusion 15 mg    diltiazem  (CARDIZEM ) 125 mg in dextrose  5% 125 mL (1 mg/mL) infusion   polyethylene glycol (MIRALAX / GLYCOLAX) packet 17 g   atorvastatin  (LIPITOR) tablet 40 mg   ipratropium-albuterol  (DUONEB) 0.5-2.5 (3) MG/3ML nebulizer solution 3 mL   DISCONTD: levothyroxine  (SYNTHROID ) tablet 88 mcg   triamcinolone  cream (KENALOG ) 0.1 % cream 1 Application   torsemide  (DEMADEX ) tablet 100 mg   levothyroxine  (SYNTHROID ) tablet 88 mcg    -I have reviewed the patients home medicines and have made adjustments as needed  Cardiac Monitoring: The patient was maintained on a cardiac monitor.  I personally viewed and interpreted  the cardiac monitored which showed an underlying rhythm of: atrial flutter   Reevaluation: After the interventions noted above, I reevaluated the patient and found that their symptoms have improved  Co morbidities that complicate the patient evaluation  Past Medical History:  Diagnosis Date   Adenomatous colon polyp    tubular   Anemia    Asthma    as a child   CHF (congestive heart failure) (HCC)    Chronic kidney disease    COPD (chronic obstructive pulmonary disease) (HCC)    Diverticulosis    External otitis of right ear 11/10/2018   Gout    Hepatitis C    HLD (hyperlipidemia)    Hypertension    OSA (obstructive sleep apnea) 11/03/2015   Sleep apnea    Strep throat 01/2021   Thyroid  disorder       Dispostion: Disposition decision including need for hospitalization was considered, and patient admitted to the hospital.    Final Clinical Impression(s) / ED Diagnoses Final diagnoses:  Atrial flutter with rapid ventricular response (HCC)     This chart was dictated using voice recognition software.  Despite best efforts to proofread,  errors can occur which can change the documentation meaning.    Mordecai Applebaum, MD 04/02/24 [phone]

## 2024-04-02 NOTE — Telephone Encounter (Signed)
 Call returned to patient he is currently in the ED.

## 2024-04-02 NOTE — ED Notes (Signed)
 Admitting team at bedside.

## 2024-04-02 NOTE — ED Triage Notes (Signed)
 BIB EMS   From dialysis   Reports tachycardia since Sunday, denied c/p and SHOB.  EMS reports HR in 140s   Stopped taking amlodipine  and labetalol  about a week ago, pt thought his b/p was too low.   Aox4, GCS 15, pt can ambulate on his own  Lives at home

## 2024-04-02 NOTE — H&P (Addendum)
 Date: 04/02/2024               Patient Name:  Johnny [name] MRN: [id number]  DOB: August 28, 1961 Age / Sex: 63 y.o., male   PCP: Joaquin Mulberry, MD         Medical Service: Internal Medicine Teaching Service         Attending Physician: Dr. Broadus Canes, Whitney Hams, MD      First Contact: Dr. Ronni Colace, DO Pager [phone]    Second Contact: Dr. Cleven Dallas, DO          After Hours (After 5p/  First Contact Pager: [phone]  weekends / holidays): Second Contact Pager: [phone]   SUBJECTIVE   Chief Complaint: Heart racing  History of Present Illness: [name] is a 62 year old male with history of of ESRD on HD TTS through [facility]), COPD, HTN, HFpEF (11/2023), OSA not on CPAP, HLD, hypothyroidism, gout, and eczema who presents to ED with feelings of his heart racing.  He first began noticing this approximately 5 years ago, and it has increased in frequency in recent weeks.  A few days ago, he was at church and began to experience an increased heart rate with associated lightheadedness, diaphoresis, and presyncope. No LOC/chest pain. The symptoms abated after ~ an hour.  During HD today he began to experience similar symptoms prompting ED visit.  He did complete HD.  He endorses a mild dry cough for the past week.  Denies headache, visual changes, fever, chills, chest pain, sick contacts, shortness of breath, orthopnea/PND, abdominal pain, dysuria, change in bowel habits.  He has been compliant with HD and all medications with the exception of discontinuing amlodipine  and labetalol  approximately 2 weeks ago after he checked blood pressure at home and it was 90/70.  Medications:  Current Outpatient Medications  Medication Instructions   albuterol  (VENTOLIN  HFA) 108 (90 Base) MCG/ACT inhaler 2 puffs, Inhalation, Every 6 hours PRN   amLODipine  (NORVASC ) 10 mg, Oral, Daily   atorvastatin  (LIPITOR) 40 mg, Oral, Daily   colchicine  0.6 MG tablet TAKE 2 TABLETS BY MOUTH AT THE ONSET  OF A GOUT FLARE. MAY REPEAT 1 TABLET IN 1 HOUR IF SYMPTOMS PERSIST   ipratropium-albuterol  (DUONEB) 0.5-2.5 (3) MG/3ML SOLN 3 mLs, Inhalation, Every 6 hours PRN   labetalol  (NORMODYNE ) 300 mg, Oral, 2 times daily   levothyroxine  (SYNTHROID ) 88 mcg, Oral, Daily   torsemide  (DEMADEX ) 100 mg, Oral, Daily   triamcinolone  cream (KENALOG ) 0.1 % APPLY CREAM EXTERNALLY TWICE DAILY  Dulera  100-5 mcg 2 puffs BID  Past Medical History Past Medical History:  Diagnosis Date   Adenomatous colon polyp    tubular   Anemia    Asthma    as a child   CHF (congestive heart failure) (HCC)    Chronic kidney disease    COPD (chronic obstructive pulmonary disease) (HCC)    Diverticulosis    External otitis of right ear 11/10/2018   Gout    Hepatitis C    HLD (hyperlipidemia)    Hypertension    OSA (obstructive sleep apnea) 11/03/2015   Sleep apnea    Strep throat 01/2021   Thyroid  disorder     Past Surgical History Past Surgical History:  Procedure Laterality Date   A/V FISTULAGRAM N/A 11/08/2023   Procedure: A/V Fistulagram;  Surgeon: Carlene Che, MD;  Location: [facility];  Service: Vascular;  Laterality: N/A;   AV FISTULA PLACEMENT  09/12/2009   Left arm AVF  Social:  Lives With: himself at home Level of Function: Independent PCP: Joaquin Mulberry, MD Substances: No current tobacco, alcohol, drugs. 15-pack-year history but quit 15 years ago. Alcohol/cocaine use 20+ years ago.  Family History:  Family History  Problem Relation Age of Onset   Hypertension Mother    Diabetes Mother    Heart disease Mother    Alzheimer's disease Mother    Arthritis Mother    Anemia Father    Kidney disease Brother    Colon cancer Neg Hx    Colon polyps Neg Hx    Esophageal cancer Neg Hx    Rectal cancer Neg Hx    Stomach cancer Neg Hx     Allergies: Allergies as of 04/02/2024 - Review Complete 04/02/2024  Allergen Reaction Noted   Aleve [naproxen] Other (See Comments) 05/02/2011    Heparin  Other (See Comments) 05/10/2011   Shellfish allergy Itching and Swelling 06/06/2022    Review of Systems: A complete ROS was negative except as per HPI.   OBJECTIVE:   Physical Exam: Blood pressure (!) 140/99, pulse (!) 52, temperature 98.6 F (37 C), temperature source Oral, resp. rate (!) 25, SpO2 100%.   Constitutional: well-appearing, lying in bed, in no acute distress HENT: normocephalic atraumatic, mucous membranes moist Eyes: conjunctiva non-erythematous Cardiovascular: irregular rate and rhythm, no m/r/g, no JVD, no LEE, 2+ DP pulses Pulmonary/Chest: normal work of breathing on room air, mild wheezing, crackles at bilateral bases Abdominal: soft, non-tender, non-distended MSK: normal bulk and tone Neurological: alert & oriented Skin: warm and dry Psych: normal mood and behavior   Labs: CBC    Component Value Date/Time   WBC 8.4 04/02/2024 1306   RBC 4.30 04/02/2024 1306   HGB 11.3 (L) 04/02/2024 1306   HGB 9.0 (L) 01/02/2024 1603   HCT 33.6 (L) 04/02/2024 1306   HCT 27.3 (L) 01/02/2024 1603   PLT 225 04/02/2024 1306   PLT 107 (L) 01/02/2024 1603   MCV 78.1 (L) 04/02/2024 1306   MCV 80 01/02/2024 1603   MCH 26.3 04/02/2024 1306   MCHC 33.6 04/02/2024 1306   RDW 18.7 (H) 04/02/2024 1306   RDW 22.6 (H) 01/02/2024 1603   LYMPHSABS 1.5 04/02/2024 1306   LYMPHSABS 1.3 01/02/2024 1603   MONOABS 1.1 (H) 04/02/2024 1306   EOSABS 0.2 04/02/2024 1306   EOSABS 0.3 01/02/2024 1603   BASOSABS 0.1 04/02/2024 1306   BASOSABS 0.1 01/02/2024 1603     CMP     Component Value Date/Time   NA 141 04/02/2024 1306   NA 135 11/08/2020 1654   K 3.5 04/02/2024 1306   CL 97 (L) 04/02/2024 1306   CO2 32 04/02/2024 1306   GLUCOSE 108 (H) 04/02/2024 1306   BUN 19 04/02/2024 1306   BUN 42 (H) 11/08/2020 1654   CREATININE 4.68 (H) 04/02/2024 1306   CREATININE 2.78 (H) 12/07/2016 1006   CALCIUM  8.3 (L) 04/02/2024 1306   CALCIUM  6.7 (LL) 10/25/2023 0916   PROT 8.4  (H) 04/02/2024 1306   PROT 7.5 11/08/2020 1654   ALBUMIN  3.0 (L) 04/02/2024 1306   ALBUMIN  3.9 11/08/2020 1654   AST 20 04/02/2024 1306   ALT 16 04/02/2024 1306   ALT 28 03/21/2016 1659   ALKPHOS 61 04/02/2024 1306   BILITOT 0.7 04/02/2024 1306   BILITOT 0.3 11/08/2020 1654   GFRNONAA 13 (L) 04/02/2024 1306   GFRNONAA 25 (L) 12/07/2016 1006   GFRAA 13 (L) 11/08/2020 1654   GFRAA 29 (L) 12/07/2016 1006  Imaging:  EKG: personally reviewed my interpretation is atrial flutter with variable AV block , LVH, prolonged Qtc (568)  ASSESSMENT & PLAN:   Assessment & Plan by Problem: Principal Problem:   Atrial flutter with rapid ventricular response (HCC) Active Problems:   ESRD on dialysis (HCC)   OSA (obstructive sleep apnea)   COPD (chronic obstructive pulmonary disease) (HCC)   Essential hypertension   Gout   Hyperlipidemia   Heart failure with preserved ejection fraction (HCC)   Anemia due to chronic kidney disease   TRAVARIS KOSH is a 63 y.o.male with history of of ESRD on HD TTS through [facility]), COPD, HTN, HFpEF (11/2023), OSA not on CPAP, HLD, hypothyroidism, gout, and eczema who presents to ED with feelings of his heart racing. Admitted for atrial flutter with RVR  Atrial Flutter with RVR Presented with HR in 140's. Started on Diltiazem  infusion in ED. HDS with improvement in HR to low 100's and currently asymptomatic. CHADSVASc is 2. HAS-BLED is 2. Given ESRD, limited anticoagulation options (Coumadin). Cardiology consulted and will see him in the morning. Unclear etiology and this appears to have started years ago with acute worsening in symptoms. No infectious signs/symptoms. Hypothyroidism controlled based on TSH and T4 11/2023. He does have untreated OSA from intolerance to CPAP.  -Continue Diltiazem  drip  -Check TTE, TSH, BNP -Follow-up Cardiology recommendations -Monitor telemetry  ESRD on dialysis [facility]) Complaint with outpatient HD TTS including  today. No edema on physical exam but CXR showed central vascular congestion with probable small pleural effusions. Labs look good. -Trend RFP -Consult to nephrology if he needs inpatient HD  HFpEF (60-65% with Grade II DD) As noted above, some signs of volume overload on CXR and lung auscultation. -Check BNP and TTE -Continue home Torsemide  100 mg daily, consider additional diuresis  COPD Mild wheezing on exam. No other signs of exacerbation.  -Continue at home Dulera  equivalent and prn Duonebs  Gout Not in acute flare. Will continue Allopurinol  on new renally-adjusted dose. -Allopurinol  100 mg after HD (TTS)  Anemia due to Chronic Kidney Disease Hgb at or better than baseline. Iron  studies from 12/2023 look good. -Monitor CBC  HTN Has been off Amlodipine  10 mg and Labetalol  300 mg BID for two weeks. Will hold for now given stable BP and active management of above. -Follow-up Cardiology recommendations  OSA not on CPAP Confirmed with sleep study. Unable to tolerate CPAP and been off it for 1.5 years. This could be contributing to atrial flutter. -Outpatient Pulmonology referral   HLD Recent Lipid panel looks good.  -Continue home Lipitor  Diet: Renal VTE: SCDs IVF: None, Code: Full  Dispo: Admit patient to Inpatient with expected length of stay greater than 2 midnights.  Signed: Ronni Colace, DO Internal Medicine Resident PGY-1 04/02/2024, 5:16 PM

## 2024-04-02 NOTE — ED Notes (Signed)
 Pt resting comfortably at this time and reports feeling much better.   Heart rate and blood pressure within goals at this time with Diltiazem  infusing at 12.5.   Pt sitting upright watching TV and using laptop.

## 2024-04-02 NOTE — Telephone Encounter (Signed)
Attempted to contact patient and VM is currently full.

## 2024-04-03 ENCOUNTER — Other Ambulatory Visit (HOSPITAL_COMMUNITY): Payer: Self-pay

## 2024-04-03 ENCOUNTER — Encounter (HOSPITAL_COMMUNITY): Payer: Self-pay

## 2024-04-03 ENCOUNTER — Inpatient Hospital Stay (HOSPITAL_COMMUNITY): Payer: MEDICAID

## 2024-04-03 DIAGNOSIS — I4892 Unspecified atrial flutter: Principal | ICD-10-CM

## 2024-04-03 LAB — CBC
HCT: 31.9 % — ABNORMAL LOW (ref 39.0–52.0)
Hemoglobin: 10.9 g/dL — ABNORMAL LOW (ref 13.0–17.0)
MCH: 26.5 pg (ref 26.0–34.0)
MCHC: 34.2 g/dL (ref 30.0–36.0)
MCV: 77.6 fL — ABNORMAL LOW (ref 80.0–100.0)
Platelets: 223 10*3/uL (ref 150–400)
RBC: 4.11 MIL/uL — ABNORMAL LOW (ref 4.22–5.81)
RDW: 18.6 % — ABNORMAL HIGH (ref 11.5–15.5)
WBC: 10.1 10*3/uL (ref 4.0–10.5)
nRBC: 0 % (ref 0.0–0.2)

## 2024-04-03 LAB — RENAL FUNCTION PANEL
Albumin: 2.8 g/dL — ABNORMAL LOW (ref 3.5–5.0)
Anion gap: 11 (ref 5–15)
BUN: 39 mg/dL — ABNORMAL HIGH (ref 8–23)
CO2: 28 mmol/L (ref 22–32)
Calcium: 8.4 mg/dL — ABNORMAL LOW (ref 8.9–10.3)
Chloride: 97 mmol/L — ABNORMAL LOW (ref 98–111)
Creatinine, Ser: 6.86 mg/dL — ABNORMAL HIGH (ref 0.61–1.24)
GFR, Estimated: 8 mL/min — ABNORMAL LOW (ref >60)
Glucose, Bld: 105 mg/dL — ABNORMAL HIGH (ref 70–99)
Phosphorus: 3.6 mg/dL (ref 2.5–4.6)
Potassium: 4.5 mmol/L (ref 3.5–5.1)
Sodium: 136 mmol/L (ref 135–145)

## 2024-04-03 LAB — ECHOCARDIOGRAM COMPLETE
AR max vel: 2.91 cm2
AV Area VTI: 2.82 cm2
AV Area mean vel: 2.64 cm2
AV Mean grad: 6.5 mmHg
AV Peak grad: 12.3 mmHg
Ao pk vel: 1.76 m/s
Area-P 1/2: 3.99 cm2
Height: 73 in
MV M vel: 4.34 m/s
MV Peak grad: 75.3 mmHg
S' Lateral: 4.1 cm
Weight: 2772.8 [oz_av]

## 2024-04-03 LAB — MAGNESIUM: Magnesium: 2 mg/dL (ref 1.7–2.4)

## 2024-04-03 MED ORDER — DILTIAZEM HCL ER COATED BEADS 120 MG PO CP24
120.0000 mg | ORAL_CAPSULE | Freq: Every day | ORAL | 0 refills | Status: DC
  Filled 2024-04-03: qty 30, 30d supply, fill #0

## 2024-04-03 MED ORDER — GUAIFENESIN ER 600 MG PO TB12
1200.0000 mg | ORAL_TABLET | Freq: Two times a day (BID) | ORAL | 0 refills | Status: AC
  Filled 2024-04-03: qty 30, 8d supply, fill #0

## 2024-04-03 MED ORDER — DILTIAZEM HCL 60 MG PO TABS
60.0000 mg | ORAL_TABLET | Freq: Four times a day (QID) | ORAL | Status: DC
  Administered 2024-04-03: 60 mg via ORAL
  Filled 2024-04-03: qty 1

## 2024-04-03 MED ORDER — APIXABAN 5 MG PO TABS
5.0000 mg | ORAL_TABLET | Freq: Two times a day (BID) | ORAL | Status: DC
  Administered 2024-04-03: 5 mg via ORAL
  Filled 2024-04-03: qty 1

## 2024-04-03 MED ORDER — DILTIAZEM HCL 60 MG PO TABS
60.0000 mg | ORAL_TABLET | Freq: Once | ORAL | Status: AC
  Administered 2024-04-03: 60 mg via ORAL
  Filled 2024-04-03: qty 1

## 2024-04-03 MED ORDER — ALLOPURINOL 100 MG PO TABS
100.0000 mg | ORAL_TABLET | ORAL | 0 refills | Status: AC
  Filled 2024-04-03: qty 15, 35d supply, fill #0

## 2024-04-03 MED ORDER — MOMETASONE FURO-FORMOTEROL FUM 100-5 MCG/ACT IN AERO: 2.0000 | INHALATION_SPRAY | Freq: Two times a day (BID) | RESPIRATORY_TRACT | Status: AC

## 2024-04-03 MED ORDER — DILTIAZEM HCL 60 MG PO TABS: 60.0000 mg | ORAL_TABLET | Freq: Two times a day (BID) | ORAL | Status: DC

## 2024-04-03 MED ORDER — GUAIFENESIN ER 600 MG PO TB12
1200.0000 mg | ORAL_TABLET | Freq: Two times a day (BID) | ORAL | Status: DC
  Administered 2024-04-03: 1200 mg via ORAL
  Filled 2024-04-03: qty 2

## 2024-04-03 MED ORDER — DILTIAZEM HCL ER COATED BEADS 120 MG PO CP24: 120.0000 mg | ORAL_CAPSULE | Freq: Every day | ORAL | Status: DC

## 2024-04-03 MED ORDER — APIXABAN 5 MG PO TABS
5.0000 mg | ORAL_TABLET | Freq: Two times a day (BID) | ORAL | 0 refills | Status: DC
  Filled 2024-04-03: qty 60, 30d supply, fill #0

## 2024-04-03 NOTE — Progress Notes (Signed)
 Discharge instructions (including medications) discussed with and copy provided to patient and he verbalized understanding.  PIV x 1 removed and patient dressed himself. TOC meds to picked up on the way out.

## 2024-04-03 NOTE — Progress Notes (Signed)
*  PRELIMINARY RESULTS* Echocardiogram 2D Echocardiogram has been performed.  Johnny [name] 04/03/2024, 2:39 PM

## 2024-04-03 NOTE — Discharge Instructions (Addendum)
 Johnny [name],  You were recently admitted to [facility] for atrial flutter which is an abnormal heart rhythm. We treated you with a medication that controls your heart rate and treats high blood pressure. For this reason we are stopping your blood pressure medications and you should discuss this change with your primary care provider. The cardiology team is setting up follow up for you in their clinic as well. We also started a blood thinning medication called Eliquis or Apixaban which lowers your risk of stroke due to your atrial flutter.   Continue taking your home medications with the following changes  Start taking Diltiazem  120 mg daily staring tomorrow (04/04/2024) Apixaban 5 mg twice a day  Stop taking Amlodipine   Labetalol   You should seek further medical care if you have any return of your symptoms or bleeding that will not stop.  We recommend that you see your primary care doctor in about a week to make sure that you continue to improve. We are so glad that you are feeling better.  Sincerely, Cleven Dallas, DO

## 2024-04-03 NOTE — Progress Notes (Signed)
 Subjective Denies SOB, presyncope, chest pain/palpitations. Cough a productive as of this AM. Yellowish green sputum. No orthopnea/PND.  Physical exam Blood pressure 131/85, pulse 67, temperature 98.3 F (36.8 C), temperature source Oral, resp. rate 16, height 6\' 1"  (1.854 m), weight 78.6 kg, SpO2 99%.  Constitutional: well-appearing, lying in bed, in no acute distress Cardiovascular: irregular rate and rhythm, systolic murmur (radiation from AV fisulata?), also sounds like a friction rub, mild JVD, no LEE Pulmonary/Chest: normal work of breathing on room air, mild wheezing, crackles at bilateral bases Abdominal: distended but soft, no TTP MSK: normal bulk and tone Neurological: alert & oriented Skin: warm and dry Psych: normal mood and behavior  Weight change:    Intake/Output Summary (Last 24 hours) at 04/03/2024 1149 Last data filed at 04/03/2024 0602 Gross per 24 hour  Intake 182.53 ml  Output 400 ml  Net -217.47 ml   Net IO Since Admission: -217.47 mL [04/03/24 1149]  Labs, images, and other studies    Latest Ref Rng & Units 04/03/2024    4:11 AM 04/02/2024    1:06 PM 03/16/2024    9:25 PM  CBC  WBC 4.0 - 10.5 K/uL 10.1  8.4  9.9   Hemoglobin 13.0 - 17.0 g/dL 16.1  09.6  04.5   Hematocrit 39.0 - 52.0 % 31.9  33.6  30.5   Platelets 150 - 400 K/uL 223  225  132        Latest Ref Rng & Units 04/03/2024    4:11 AM 04/02/2024    1:06 PM 03/16/2024    9:25 PM  BMP  Glucose 70 - 99 mg/dL 409  811  914   BUN 8 - 23 mg/dL 39  19  78   Creatinine 0.61 - 1.24 mg/dL 7.82  9.56  21.30   Sodium 135 - 145 mmol/L 136  141  137   Potassium 3.5 - 5.1 mmol/L 4.5  3.5  4.6   Chloride 98 - 111 mmol/L 97  97  96   CO2 22 - 32 mmol/L 28  32  24   Calcium  8.9 - 10.3 mg/dL 8.4  8.3  8.8      Assessment and plan Hospital day 1  JOSHVA LABRECK is a 63 y.o.male with history of of ESRD on HD TTS through [facility]), COPD, HTN, HFpEF (11/2023), OSA not on  CPAP, HLD, hypothyroidism, gout, and eczema who presents to ED with feelings of his heart racing. Admitted for atrial flutter with RVR   Atrial Flutter with RVR Cardiology consulted, appreciate recommendations. HR much improved and he has been transitioned to po Cardizem  60 mg QID. Will assess tolerance and plan accordingly (also waiting for Cardiology to weight in). TSH normal. TTE is pending -Follow-up Cardiology recommendations -Monitor telemetry   ESRD on dialysis (HCC) RFP is stable. Will consult Nephrology if plan is to stay tonight for inpatient HD per TTS. -Trend RFP    HFpEF (60-65% with Grade II DD) BNP elevated at 1,900. He has been resumed on home Torsemide . No overt signs of hypervolemia. Will await TTE and continue home Torsemide  100 mg daily for now.    COPD Wheezing has improved. Now has a productive cough, but I don't believe this to be an exacerbation. Will support conservatively for now with Mucinex  and Flutter valve in addition to below. -Continue at home Dulera  equivalent and prn Duonebs  Gout Not in acute flare. Will continue Allopurinol  on new renally-adjusted dose. -Allopurinol  100 mg after HD (TTS)   Anemia due to Chronic Kidney Disease Hgb at baseline. Iron  studies from 12/2023 look good. -Monitor CBC   HTN Has been off Amlodipine  10 mg and Labetalol  300 mg BID for two weeks. Will hold for now given stable BP and active management of above. -Follow-up Cardiology recommendations   OSA not on CPAP Confirmed with sleep study. Unable to tolerate CPAP and been off it for 1.5 years. This could be contributing to atrial flutter. -Outpatient Pulmonology referral    HLD Recent Lipid panel looks good.  -Continue home Lipitor  Ronni Colace, DO 04/03/2024, 11:49 AM  Pager: [phone] After 5pm or weekend: [phone]

## 2024-04-03 NOTE — Hospital Course (Addendum)
 Johnny [name] is a 63 y.o.male with history of of ESRD on HD TTS through [facility]), COPD, HTN, HFpEF (11/2023), OSA not on CPAP, HLD, hypothyroidism, gout, and eczema who presents to ED with feelings of his heart racing. Admitted for atrial flutter with RVR   Atrial Flutter with RVR Presented with HR in 140's. Started on Diltiazem  infusion in ED. HDS with improvement in HR to low 100's and currently asymptomatic. CHADSVASc is 2. HAS-BLED is 2. Cardiology consulted. TSH normal. Does have untreated OSA. Transitioned off Diltiazem  infusion on hospital day 2 and put on po diltiazem  with continued control of HR. He was started on eliquis . TTE showed normal LV systolic function. He was discharged on diltiazem  24hr 120 mg daily and other antihypertensives were not resumed. He will follow up with cardiology outpatient.    ESRD on dialysis [facility]) Complaint with outpatient HD TTS. Did not need HD during admission.   HFpEF (60-65% with Grade II DD) BNP elevated at 1,900 on admission with mild sings of exacerbation due to a-flutter with RVR. No oxygen needs. Continued home oral torsemide  100 mg daily.   COPD Mild wheezing on exam initially, likely due to fluid. Given mucinex  for cough. No exacerbation.    Gout Not in acute flare. Will continue Allopurinol  on new renally-adjusted dose. Allopurinol  100 mg after HD (TTS)   Anemia due to Chronic Kidney Disease Stable on CBC here.    HTN Has been off Amlodipine  10 mg and Labetalol  300 mg BID for two weeks PTA. Discontinued with addition of diltiazem  for rate control.    OSA not on CPAP Confirmed with sleep study. Unable to tolerate CPAP and been off it for 1.5 years. This could be contributing to atrial flutter. Recommend he retry CPAP, may need new referral.

## 2024-04-03 NOTE — Consult Note (Addendum)
 Cardiology Consultation   Patient ID: [name] Johnny MRN: [id number]; DOB: 04-07-1961  Admit date: 04/02/2024 Date of Consult: 04/03/2024  PCP:  Joaquin Mulberry, MD   [facility] Providers Cardiologist:  Gaylyn Keas, MD        Patient Profile: Johnny [name] is a 63 y.o. male with a hx of ESRD on HD, COPD, hypertension, HFpEF, OSA, hyperlipidemia, hypothyroidism, gout who is being seen 04/03/2024 for the evaluation of atrial flutter with RVR at the request of Dr. Herminio Lopes.  History of Present Illness: Mr. Melchior presented to the ED after developing rapid HR/palpitations while in HD. Per patient, has actually noticed episodes of "racing heart" for the last year or more. Reports increased frequency of this sensation in the past few weeks. Notes an episode at church this week when he developed rapid HR with some lightheadedness. In the ED, patient found to be in atrial flutter with variable ventricular conduction. He was started on diltiazem  and admitted for further management. Following initiation of Diltiazem , patient with improved rates to low 100s. Patient now asymptomatic with controlled rates. Denies any chest pain or shortness of breath worse than baseline. Of note, patient stopped taking his Amlodipine  and Labetalol  2 weeks ago due to low BP at home. Suspect this relates to increased frequency of racing heart in the last 2 weeks.   Past Medical History:  Diagnosis Date   Adenomatous colon polyp    tubular   Anemia    Asthma    as a child   CHF (congestive heart failure) (HCC)    Chronic kidney disease    COPD (chronic obstructive pulmonary disease) (HCC)    Diverticulosis    External otitis of right ear 11/10/2018   Gout    Hepatitis C    HLD (hyperlipidemia)    Hypertension    OSA (obstructive sleep apnea) 11/03/2015   Sleep apnea    Strep throat 01/2021   Thyroid  disorder     Past Surgical History:  Procedure Laterality Date   A/V FISTULAGRAM N/A 11/08/2023    Procedure: A/V Fistulagram;  Surgeon: Carlene Che, MD;  Location: [facility];  Service: Vascular;  Laterality: N/A;   AV FISTULA PLACEMENT  09/12/2009   Left arm AVF     Home Medications:  Prior to Admission medications   Medication Sig Start Date End Date Taking? Authorizing Provider  albuterol  (VENTOLIN  HFA) 108 (90 Base) MCG/ACT inhaler Inhale 2 puffs into the lungs every 6 (six) hours as needed for wheezing or shortness of breath. Patient taking differently: Inhale 1 puff into the lungs every 6 (six) hours as needed for wheezing or shortness of breath. 12/01/21  Yes Ghimire, Letty Raya, MD  atorvastatin  (LIPITOR) 40 MG tablet Take 1 tablet (40 mg total) by mouth daily. 01/02/24  Yes McClung, Stan Eans, PA-C  colchicine  0.6 MG tablet TAKE 2 TABLETS BY MOUTH AT THE ONSET OF A GOUT FLARE. MAY REPEAT 1 TABLET IN 1 HOUR IF SYMPTOMS PERSIST 01/17/24  Yes Newlin, Enobong, MD  ipratropium-albuterol  (DUONEB) 0.5-2.5 (3) MG/3ML SOLN Inhale 3 mLs into the lungs every 6 (six) hours as needed. 12/18/23  Yes Pokhrel, Laxman, MD  levothyroxine  (SYNTHROID ) 88 MCG tablet Take 88 mcg by mouth daily.   Yes [provider]  torsemide  (DEMADEX ) 100 MG tablet Take 1 tablet (100 mg total) by mouth daily. 01/02/24 06/30/24 Yes Hassie Lint, PA-C  triamcinolone  cream (KENALOG ) 0.1 % APPLY CREAM EXTERNALLY TWICE DAILY Patient taking differently: Apply 1  Application topically as needed (rash on hand). 10/23/23  Yes Newlin, Enobong, MD  amLODipine  (NORVASC ) 10 MG tablet Take 1 tablet (10 mg total) by mouth daily. Patient not taking: Reported on 04/02/2024 01/02/24   Hassie Lint, PA-C  labetalol  (NORMODYNE ) 300 MG tablet Take 1 tablet by mouth twice daily Patient not taking: Reported on 04/02/2024 12/09/23   Newlin, Enobong, MD    Scheduled Meds:  [START ON 04/04/2024] allopurinol   100 mg Oral Q T,Th,Sat-1800   atorvastatin   40 mg Oral Daily   diltiazem   60 mg Oral QID   fluticasone  furoate-vilanterol  1 puff Inhalation Daily   guaiFENesin   1,200 mg Oral BID   levothyroxine   88 mcg Oral Q0600   torsemide   100 mg Oral Daily   Continuous Infusions:  PRN Meds: ipratropium-albuterol , polyethylene glycol, triamcinolone  cream  Allergies:    Allergies  Allergen Reactions   Aleve [Naproxen] Other (See Comments)    Unknown reaction    Heparin  Other (See Comments)    Pt reports he's not sure what type of reaction   Shellfish Allergy Itching and Swelling    Social History:   Social History   Socioeconomic History   Marital status: Divorced    Spouse name: Not on file   Number of children: 0   Years of education: 14   Highest education level: Not on file  Occupational History   Occupation: Archivist  Tobacco Use   Smoking status: Former    Current packs/day: 0.00    Types: Cigarettes    Quit date: 04/02/2011    Years since quitting: 13.0   Smokeless tobacco: Never   Tobacco comments:    trying to quit  Vaping Use   Vaping status: Never Used  Substance and Sexual Activity   Alcohol use: Never    Alcohol/week: 0.0 standard drinks of alcohol   Drug use: No   Sexual activity: Not Currently  Other Topics Concern   Not on file  Social History Narrative   Lives at home alone.   Right-handed.    Occasional caffeine use.   Social Drivers of Corporate investment banker Strain: Low Risk  (12/23/2023)   Overall Financial Resource Strain (CARDIA)    Difficulty of Paying Living Expenses: Not very hard  Food Insecurity: No Food Insecurity (04/02/2024)   Hunger Vital Sign    Worried About Running Out of Food in the Last Year: Never true    Ran Out of Food in the Last Year: Never true  Transportation Needs: No Transportation Needs (04/03/2024)   PRAPARE - Administrator, Civil Service (Medical): No    Lack of Transportation (Non-Medical): No  Physical Activity: Not on file  Stress: Stress Concern Present (11/20/2023)   [employer] of  Occupational Health - Occupational Stress Questionnaire    Feeling of Stress : Rather much  Social Connections: Moderately Integrated (11/20/2023)   Social Connection and Isolation Panel [NHANES]    Frequency of Communication with Friends and Family: More than three times a week    Frequency of Social Gatherings with Friends and Family: More than three times a week    Attends Religious Services: More than 4 times per year    Active Member of [employer] or Organizations: Yes    Attends Banker Meetings: More than 4 times per year    Marital Status: Divorced  Intimate Partner Violence: Not At Risk (04/02/2024)   Humiliation, Afraid, Rape, and Kick questionnaire  Fear of Current or Ex-Partner: No    Emotionally Abused: No    Physically Abused: No    Sexually Abused: No    Family History:    Family History  Problem Relation Age of Onset   Hypertension Mother    Diabetes Mother    Heart disease Mother    Alzheimer's disease Mother    Arthritis Mother    Anemia Father    Kidney disease Brother    Colon cancer Neg Hx    Colon polyps Neg Hx    Esophageal cancer Neg Hx    Rectal cancer Neg Hx    Stomach cancer Neg Hx      ROS:  Please see the history of present illness.   All other ROS reviewed and negative.     Physical Exam/Data: Vitals:   04/03/24 0000 04/03/24 0300 04/03/24 0400 04/03/24 0826  BP: 139/86 122/72  131/85  Pulse: 72 70 73 67  Resp: 18   16  Temp:   98.8 F (37.1 C) 98.3 F (36.8 C)  TempSrc:   Oral Oral  SpO2: 97% 98% 97% 99%  Weight:      Height:        Intake/Output Summary (Last 24 hours) at 04/03/2024 1304 Last data filed at 04/03/2024 1302 Gross per 24 hour  Intake 362.53 ml  Output 400 ml  Net -37.47 ml      04/02/2024    6:48 PM 03/16/2024    8:16 PM 01/07/2024    2:08 PM  Last 3 Weights  Weight (lbs) 173 lb 4.8 oz 171 lb 1.2 oz 171 lb  Weight (kg) 78.608 kg 77.6 kg 77.565 kg     Body mass index is 22.86 kg/m.  General:  Well  nourished, well developed, in no acute distress HEENT: normal Neck: no JVD Vascular: No carotid bruits; Distal pulses 2+ bilaterally Cardiac:  normal S1, S2; irregularly irregular; no murmur  Lungs:  clear to auscultation bilaterally, no wheezing, rhonchi or rales  Abd: soft, nontender, no hepatomegaly  Ext: no edema Musculoskeletal:  No deformities, BUE and BLE strength normal and equal Skin: warm and dry  Neuro:  CNs 2-12 intact, no focal abnormalities noted Psych:  Normal affect   EKG:  The EKG was personally reviewed and demonstrates: typical appearing atrial flutter with ventricular rates ~139bpm.  Telemetry:  Telemetry was personally reviewed and demonstrates:  Patient with rate controlled coarse afib vs flutter today, HR 70s.  Relevant CV Studies: IMPRESSIONS     1. Left ventricular ejection fraction, by estimation, is 60 to 65%. The  left ventricle has normal function. The left ventricle has no regional  wall motion abnormalities. The left ventricular internal cavity size was  mildly dilated. Left ventricular  diastolic parameters are consistent with Grade II diastolic dysfunction  (pseudonormalization).   2. Right ventricular systolic function is normal. The right ventricular  size is normal. There is normal pulmonary artery systolic pressure.   3. Left atrial size was severely dilated.   4. Right atrial size was moderately dilated.   5. The mitral valve is normal in structure. Mild mitral valve  regurgitation. No evidence of mitral stenosis.   6. The aortic valve is tricuspid. Aortic valve regurgitation is not  visualized. No aortic stenosis is present.   7. The inferior vena cava is normal in size with greater than 50%  respiratory variability, suggesting right atrial pressure of 3 mmHg.   FINDINGS   Left Ventricle: Left ventricular ejection fraction,  by estimation, is 60  to 65%. The left ventricle has normal function. The left ventricle has no  regional wall  motion abnormalities. The left ventricular internal cavity  size was mildly dilated. There is   no left ventricular hypertrophy. Left ventricular diastolic parameters  are consistent with Grade II diastolic dysfunction (pseudonormalization).   Right Ventricle: The right ventricular size is normal. No increase in  right ventricular wall thickness. Right ventricular systolic function is  normal. There is normal pulmonary artery systolic pressure. The tricuspid  regurgitant velocity is 1.02 m/s, and   with an assumed right atrial pressure of 15 mmHg, the estimated right  ventricular systolic pressure is 19.2 mmHg.   Left Atrium: Left atrial size was severely dilated.   Right Atrium: Right atrial size was moderately dilated.   Pericardium: There is no evidence of pericardial effusion.   Mitral Valve: The mitral valve is normal in structure. Mild mitral valve  regurgitation. No evidence of mitral valve stenosis.   Tricuspid Valve: The tricuspid valve is normal in structure. Tricuspid  valve regurgitation is mild . No evidence of tricuspid stenosis.   Aortic Valve: The aortic valve is tricuspid. Aortic valve regurgitation is  not visualized. No aortic stenosis is present. Aortic valve peak gradient  measures 9.0 mmHg.   Pulmonic Valve: The pulmonic valve was normal in structure. Pulmonic valve  regurgitation is not visualized. No evidence of pulmonic stenosis.   Aorta: The aortic root is normal in size and structure.   Venous: The inferior vena cava is normal in size with greater than 50%  respiratory variability, suggesting right atrial pressure of 3 mmHg.   IAS/Shunts: No atrial level shunt detected by color flow Doppler.   Laboratory Data: High Sensitivity Troponin:   Recent Labs  Lab 03/16/24 2125 04/02/24 1306 04/02/24 1457  TROPONINIHS 29* 40* 39*     Chemistry Recent Labs  Lab 04/02/24 1306 04/03/24 0411  NA 141 136  K 3.5 4.5  CL 97* 97*  CO2 32 28  GLUCOSE  108* 105*  BUN 19 39*  CREATININE 4.68* 6.86*  CALCIUM  8.3* 8.4*  MG 2.0 2.0  GFRNONAA 13* 8*  ANIONGAP 12 11    Recent Labs  Lab 04/02/24 1306 04/03/24 0411  PROT 8.4*  --   ALBUMIN  3.0* 2.8*  AST 20  --   ALT 16  --   ALKPHOS 61  --   BILITOT 0.7  --    Lipids No results for input(s): "CHOL", "TRIG", "HDL", "LABVLDL", "LDLCALC", "CHOLHDL" in the last 168 hours.  Hematology Recent Labs  Lab 04/02/24 1306 04/03/24 0411  WBC 8.4 10.1  RBC 4.30 4.11*  HGB 11.3* 10.9*  HCT 33.6* 31.9*  MCV 78.1* 77.6*  MCH 26.3 26.5  MCHC 33.6 34.2  RDW 18.7* 18.6*  PLT 225 223   Thyroid   Recent Labs  Lab 04/02/24 1654  TSH 1.546    BNP Recent Labs  Lab 04/02/24 1306  BNP 1,951.6*    DDimer No results for input(s): "DDIMER" in the last 168 hours.  Radiology/Studies:  DG Chest Portable 1 View Result Date: 04/02/2024 CLINICAL DATA:  Fatigue EXAM: PORTABLE CHEST 1 VIEW COMPARISON:  03/16/2024, 12/16/2023, 09/20/2022 FINDINGS: Cardiomegaly with globular cardiac enlargement. Mild central vascular congestion. Probable small pleural effusions. Mild atelectasis at the left base. IMPRESSION: Cardiomegaly with mild central vascular congestion and probable small pleural effusions. Globular cardiac enlargement which may be due to multi chamber enlargement with pericardial effusion not excluded Electronically Signed  By: Esmeralda Hedge M.D.   On: 04/02/2024 15:35     Assessment and Plan:  Atrial flutter with RVR Secondary hypercoagulable state Patient reporting nearly a year of intermitting palpitations/racing heart sensation, admitted after presenting from dialysis with atrial flutter. Notes increased frequency over the last 2 weeks (with corresponds with discontinuation of Labetalol  2 weeks ago due to hypotension). Now rate controlled on diltiazem  infusion. Given CHADs2VASC score 2, meets guidelines for [facility] stroke prophylaxis. Recommend starting Eliquis  5mg  BID prior to  discharge. Echocardiogram is pending. Most recent study shows preserved LVEF with severely dilated atria. Given dilated atria and suspected untreated OSA, rhythm control will be very difficult. Recommend rate control strategy at this time. If LVEF normal, recommend transition to PO Cardizem  CD 120mg  since it appears that patient had breakthrough tachycardia on home Labetalol  (though more significant symptoms happened after stopping). If LVEF reduced, would utilize beta blocker for rate control (consider Metoprolol over Labetalol ).  Will need outpatient sleep study for OSA.  HFpEF Patient's chart carries diagnosis of HFpEF. BNP significantly elevated this admission (though limited clinical use with ESRD status). Faint bibasilar crackles on exam today. Volume management per nephrology.  Hypertension BP has been generally well controlled this admission on Diltiazem . Given recent low home BP, would use caution resuming home agents. If Diltiazem  PO prescribed for rate control, will need to stop Amlodipine . Resuming home Labetalol  vs other beta blocker will depend on rate control strategy as above.  Hyperlipidemia Continue home statin.  Per primary team: Gout Anemia ESRD   Risk Assessment/Risk Scores:         CHA2DS2-VASc Score = 2   This indicates a 2.2% annual risk of stroke. The patient's score is based upon: CHF History: 1 HTN History: 1 Diabetes History: 0 Stroke History: 0 Vascular Disease History: 0 Age Score: 0 Gender Score: 0        For questions or updates, please contact [facility] Please consult [email or web address] for contact info under    Signed, Leala Prince, PA-C  04/03/2024 1:04 PM  I have personally seen and examined this patient. I agree with the assessment and plan as outlined above.  63 yo male with history of ESRD on HD, COPd, HFpEF, sleep apnea, HLD, HTN admitted with rapid atrial flutter. Now rate controlled on IV Cardizem . He has not been  started on anti-coagulation.  No complaints today Labs reviewed by me EKG reviewed by me My exam: NAD  Irreg irreg  Lungs clear  Ext: no LE edema Plan: Atrial flutter: rate controlled. If LV function is normal, would stop IV Cardizem  and start Cardizem  CD 120 mg daily. If LV function is below 50%, would consider Toprol. Echo pending. CHADS VASC score of 2. Would start Eliquis  for anti-coagulation.   Antoinette Batman, MD, [facility] 04/03/2024 1:26 PM

## 2024-04-03 NOTE — Plan of Care (Signed)

## 2024-04-03 NOTE — Discharge Summary (Signed)
 Name: Johnny [name] MRN: [id number] DOB: 02/12/1961 63 y.o. PCP: Johnny Mulberry, MD  Date of Admission: 04/02/2024 11:51 AM Date of Discharge: 04/03/2024 Attending Physician: Dr. Broadus Canes  Discharge Diagnosis: Principal Problem:   Atrial flutter with rapid ventricular response (HCC) Active Problems:   ESRD on dialysis (HCC)   OSA (obstructive sleep apnea)   COPD (chronic obstructive pulmonary disease) (HCC)   Essential hypertension   Gout   Hyperlipidemia   Heart failure with preserved ejection fraction (HCC)   Anemia due to chronic kidney disease    Discharge Medications: Allergies as of 04/03/2024       Reactions   Aleve [naproxen] Other (See Comments)   Unknown reaction    Heparin  Other (See Comments)   Pt reports he's not sure what type of reaction   Shellfish Allergy Itching, Swelling        Medication List     STOP taking these medications    amLODipine  10 MG tablet Commonly known as: NORVASC    labetalol  300 MG tablet Commonly known as: NORMODYNE        TAKE these medications    allopurinol  100 MG tablet Commonly known as: ZYLOPRIM  Take 1 tablet (100 mg total) by mouth every Tuesday, Thursday, and Saturday at 6 PM.   atorvastatin  40 MG tablet Commonly known as: LIPITOR Take 1 tablet (40 mg total) by mouth daily.   colchicine  0.6 MG tablet TAKE 2 TABLETS BY MOUTH AT THE ONSET OF A GOUT FLARE. MAY REPEAT 1 TABLET IN 1 HOUR IF SYMPTOMS PERSIST   diltiazem  120 MG 24 hr capsule Commonly known as: CARDIZEM  CD Take 1 capsule (120 mg total) by mouth daily.   Eliquis  5 MG Tabs tablet Generic drug: apixaban  Take 1 tablet (5 mg total) by mouth 2 (two) times daily.   guaiFENesin  600 MG 12 hr tablet Commonly known as: MUCINEX  Take 2 tablets (1,200 mg total) by mouth 2 (two) times daily.   ipratropium-albuterol  0.5-2.5 (3) MG/3ML Soln Commonly known as: DUONEB Inhale 3 mLs into the lungs every 6 (six) hours as needed.   levothyroxine  88 MCG  tablet Commonly known as: SYNTHROID  Take 88 mcg by mouth daily.   mometasone -formoterol  100-5 MCG/ACT Aero Commonly known as: DULERA  Inhale 2 puffs into the lungs in the morning and at bedtime.   torsemide  100 MG tablet Commonly known as: DEMADEX  Take 1 tablet (100 mg total) by mouth daily.   triamcinolone  cream 0.1 % Commonly known as: KENALOG  APPLY CREAM EXTERNALLY TWICE DAILY What changed:  how much to take how to take this when to take this reasons to take this additional instructions   Ventolin  HFA 108 (90 Base) MCG/ACT inhaler Generic drug: albuterol  Inhale 2 puffs into the lungs every 6 (six) hours as needed for wheezing or shortness of breath. What changed: how much to take        Disposition and follow-up:   Johnny [name] was discharged from [facility] [place] in Good condition.  At the hospital follow up visit please address:  1.  Follow-up:  a. Blood pressure control after starting diltiazem  and stopping amlodipine  and labetalol .   Follow-up Appointments:  Follow-up Information     Johnny Mulberry, MD. Schedule an appointment as soon as possible for a visit in 1 week(s).   Specialty: Family Medicine Contact information: [address] [place] [address] [place] [address] [phone]                 Hospital Course by  problem list: Johnny [name] is a 63 y.o.male with history of of ESRD on HD TTS through [facility]), COPD, HTN, HFpEF (11/2023), OSA not on CPAP, HLD, hypothyroidism, gout, and eczema who presents to ED with feelings of his heart racing. Admitted for atrial flutter with RVR   Atrial Flutter with RVR Presented with HR in 140's. Started on Diltiazem  infusion in ED. HDS with improvement in HR to low 100's and currently asymptomatic. CHADSVASc is 2. HAS-BLED is 2. Cardiology consulted. TSH normal. Does have untreated OSA. Transitioned off Diltiazem  infusion on hospital day 2 and put on [name] diltiazem  with  continued control of HR. He was started on eliquis . TTE showed normal LV systolic function. He was discharged on diltiazem  24hr 120 mg daily and other antihypertensives were not resumed. He will follow up with cardiology outpatient.    ESRD on dialysis [facility]) Complaint with outpatient HD TTS. Did not need HD during admission.   HFpEF (60-65% with Grade II DD) BNP elevated at 1,900 on admission with mild sings of exacerbation due to a-flutter with RVR. No oxygen needs. Continued home oral torsemide  100 mg daily.   COPD Mild wheezing on exam initially, likely due to fluid. Given mucinex  for cough. No exacerbation.    Gout Not in acute flare. Will continue Allopurinol  on new renally-adjusted dose. Allopurinol  100 mg after HD (TTS)   Anemia due to Chronic Kidney Disease Stable on CBC here.    HTN Has been off Amlodipine  10 mg and Labetalol  300 mg BID for two weeks PTA. Discontinued with addition of diltiazem  for rate control.    OSA not on CPAP Confirmed with sleep study. Unable to tolerate CPAP and been off it for 1.5 years. This could be contributing to atrial flutter. Recommend he retry CPAP, may need new referral.    Discharge Subjective: Pt is doing well off dilt drip after transition to oral dilt. Discussed risks/benefits of eliquis  and he agrees to start this.   Discharge Exam:   BP 130/78 (BP Location: Right Arm)   Pulse 68   Temp 98.8 F (37.1 C) (Oral)   Resp 16   Ht 6\' 1"  (1.854 m)   Wt 78.6 kg   SpO2 100%   BMI 22.86 kg/m  Constitutional: well-appearing male sitting in bed, in no acute distress Cardiovascular: regular rate and rhythm Pulmonary/Chest: normal work of breathing on room air  Pertinent Labs, Studies, and Procedures:     Latest Ref Rng & Units 04/03/2024    4:11 AM 04/02/2024    1:06 PM 03/16/2024    9:25 PM  CBC  WBC 4.0 - 10.5 K/uL 10.1  8.4  9.9   Hemoglobin 13.0 - 17.0 g/dL 95.2  84.1  32.4   Hematocrit 39.0 - 52.0 % 31.9  33.6  30.5   Platelets  150 - 400 K/uL 223  225  132        Latest Ref Rng & Units 04/03/2024    4:11 AM 04/02/2024    1:06 PM 03/16/2024    9:25 PM  CMP  Glucose 70 - 99 mg/dL 401  027  253   BUN 8 - 23 mg/dL 39  19  78   Creatinine 0.61 - 1.24 mg/dL 6.64  4.03  47.42   Sodium 135 - 145 mmol/L 136  141  137   Potassium 3.5 - 5.1 mmol/L 4.5  3.5  4.6   Chloride 98 - 111 mmol/L 97  97  96   CO2  22 - 32 mmol/L 28  32  24   Calcium  8.9 - 10.3 mg/dL 8.4  8.3  8.8   Total Protein 6.5 - 8.1 g/dL  8.4  7.9   Total Bilirubin 0.0 - 1.2 mg/dL  0.7  0.6   Alkaline Phos 38 - 126 U/L  61  70   AST 15 - 41 U/L  20  45   ALT 0 - 44 U/L  16  78     DG Chest Portable 1 View Result Date: 04/02/2024 CLINICAL DATA:  Fatigue EXAM: PORTABLE CHEST 1 VIEW COMPARISON:  03/16/2024, 12/16/2023, 09/20/2022 FINDINGS: Cardiomegaly with globular cardiac enlargement. Mild central vascular congestion. Probable small pleural effusions. Mild atelectasis at the left base. IMPRESSION: Cardiomegaly with mild central vascular congestion and probable small pleural effusions. Globular cardiac enlargement which may be due to multi chamber enlargement with pericardial effusion not excluded Electronically Signed   By: Esmeralda Hedge M.D.   On: 04/02/2024 15:35     Discharge Instructions: [place], Muldrow were recently admitted to [facility] for atrial flutter which is an abnormal heart rhythm. We treated you with a medication that controls your heart rate and treats high blood pressure. For this reason we are stopping your blood pressure medications and you should discuss this change with your primary care provider. The cardiology team is setting up follow up for you in their clinic as well. We also started a blood thinning medication called Eliquis  or Apixaban  which lowers your risk of stroke due to your atrial flutter.   Continue taking your home medications with the following changes  Start taking Diltiazem  120 mg daily staring tomorrow  (04/04/2024) Apixaban  5 mg twice a day  Stop taking Amlodipine   Labetalol   You should seek further medical care if you have any return of your symptoms or bleeding that will not stop.  We recommend that you see your primary care doctor in about a week to make sure that you continue to improve. We are so glad that you are feeling better.  Sincerely, Cleven Dallas, DO  Signed: Cleven Dallas, DO Internal Medicine Resident, PGY-2 Pager# [phone] 04/04/2024, 6:16 AM   Please contact the on call pager after 5 pm and on weekends at [phone].

## 2024-04-03 NOTE — Plan of Care (Signed)
   Problem: Education: Goal: Knowledge of General Education information will improve Description Including pain rating scale, medication(s)/side effects and non-pharmacologic comfort measures Outcome: Progressing

## 2024-04-04 ENCOUNTER — Telehealth: Payer: Self-pay | Admitting: Nephrology

## 2024-04-04 NOTE — Telephone Encounter (Signed)
 Transition of Care - Initial Contact after Hospitalization  Date of discharge:  04/03/24 Date of contact: 04/04/24  Method: Phone Spoke to: Patient  Patient contacted to discuss transition of care from recent inpatient hospitalization. Patient was admitted to [facility] from 5/29-5/30/25 with discharge diagnosis of atrial flutter with RVR.   The discharge medication list was reviewed. Hasn't taken Cardizem  yet, waiting until after dialysis   Patient will return to his/her outpatient HD unit on: Had dialysis Saturday.   No other concerns at this time.

## 2024-04-06 ENCOUNTER — Telehealth: Payer: Self-pay | Admitting: *Deleted

## 2024-04-06 NOTE — Transitions of Care (Post Inpatient/ED Visit) (Signed)
   04/06/2024  Name: Johnny [name] MRN: [id number] DOB: [birth year]  Today's TOC FU Call Status: Today's TOC FU Call Status:: Unsuccessful Call (1st Attempt) Unsuccessful Call (1st Attempt) Date: 04/06/24  Attempted to reach the patient regarding the most recent Inpatient/ED visit.  Follow Up Plan: Additional outreach attempts will be made to reach the patient to complete the Transitions of Care (Post Inpatient/ED visit) call.   Arna Better RN, BSN [place]  [facility] RN Care Manager [phone]

## 2024-04-07 ENCOUNTER — Other Ambulatory Visit: Payer: Self-pay | Admitting: Family Medicine

## 2024-04-07 ENCOUNTER — Telehealth: Payer: Self-pay | Admitting: Family Medicine

## 2024-04-07 ENCOUNTER — Telehealth: Payer: Self-pay | Admitting: *Deleted

## 2024-04-07 DIAGNOSIS — L2089 Other atopic dermatitis: Secondary | ICD-10-CM

## 2024-04-07 NOTE — Telephone Encounter (Signed)
 Copied from CRM [id number]. Topic: Clinical - Request for Lab/Test Order >> Apr 07, 2024  1:32 PM Yolanda T wrote:  Reason for CRM: [facility] Gal[facility] needs Dulce Gibbs to co-sign orders and faxt back to [phone].

## 2024-04-07 NOTE — Transitions of Care (Post Inpatient/ED Visit) (Signed)
   04/07/2024  Name: Johnny [name] MRN: [id number] DOB: 01-08-1961  Today's TOC FU Call Status: Today's TOC FU Call Status:: Unsuccessful Call (3rd Attempt) Unsuccessful Call (3rd Attempt) Date: 04/07/24  Attempted to reach the patient regarding the most recent Inpatient/ED visit.  Follow Up Plan: No further outreach attempts will be made at this time. We have been unable to contact the patient.  Arna Better RN, BSN [place]  [facility] RN Care Manager [phone]

## 2024-04-07 NOTE — Transitions of Care (Post Inpatient/ED Visit) (Signed)
   04/07/2024  Name: Johnny [name] MRN: [id number] DOB: Jun 15, 1961  Today's TOC FU Call Status: Today's TOC FU Call Status:: Unsuccessful Call (2nd Attempt) Unsuccessful Call (2nd Attempt) Date: 04/07/24  Attempted to reach the patient regarding the most recent Inpatient/ED visit.  Shortly into the [facility] assessment patient requested a call back in 20 minutes. RNCM will attempt to reach patient.  Follow Up Plan: Additional outreach attempts will be made to reach the patient to complete the Transitions of Care (Post Inpatient/ED visit) call.   Arna Better RN, BSN [place]  [facility] Health RN Care Manager [phone]

## 2024-04-07 NOTE — Telephone Encounter (Signed)
 Routing to PCP for review.

## 2024-04-08 ENCOUNTER — Other Ambulatory Visit: Payer: MEDICAID

## 2024-04-16 ENCOUNTER — Other Ambulatory Visit: Payer: MEDICAID

## 2024-04-23 ENCOUNTER — Ambulatory Visit (HOSPITAL_COMMUNITY): Payer: MEDICAID | Admitting: Physician Assistant

## 2024-04-27 ENCOUNTER — Ambulatory Visit (HOSPITAL_COMMUNITY)
Admission: RE | Admit: 2024-04-27 | Discharge: 2024-04-27 | Disposition: A | Discharge status: Home / Self Care | Payer: MEDICAID | Source: Ambulatory Visit | Attending: Physician Assistant | Admitting: Physician Assistant

## 2024-04-27 ENCOUNTER — Encounter (HOSPITAL_COMMUNITY): Payer: Self-pay | Admitting: Physician Assistant

## 2024-04-27 VITALS — BP 132/98 | HR 124 | Ht 73.0 in | Wt 171.6 lb

## 2024-04-27 DIAGNOSIS — I5032 Chronic diastolic (congestive) heart failure: Secondary | ICD-10-CM | POA: Insufficient documentation

## 2024-04-27 DIAGNOSIS — Z992 Dependence on renal dialysis: Secondary | ICD-10-CM | POA: Diagnosis not present

## 2024-04-27 DIAGNOSIS — E785 Hyperlipidemia, unspecified: Secondary | ICD-10-CM | POA: Insufficient documentation

## 2024-04-27 DIAGNOSIS — Z7901 Long term (current) use of anticoagulants: Secondary | ICD-10-CM | POA: Insufficient documentation

## 2024-04-27 DIAGNOSIS — N186 End stage renal disease: Secondary | ICD-10-CM | POA: Diagnosis not present

## 2024-04-27 DIAGNOSIS — G4733 Obstructive sleep apnea (adult) (pediatric): Secondary | ICD-10-CM | POA: Diagnosis not present

## 2024-04-27 DIAGNOSIS — I4819 Other persistent atrial fibrillation: Secondary | ICD-10-CM

## 2024-04-27 DIAGNOSIS — I4892 Unspecified atrial flutter: Secondary | ICD-10-CM

## 2024-04-27 DIAGNOSIS — D6869 Other thrombophilia: Secondary | ICD-10-CM

## 2024-04-27 DIAGNOSIS — I483 Typical atrial flutter: Secondary | ICD-10-CM

## 2024-04-27 DIAGNOSIS — I484 Atypical atrial flutter: Secondary | ICD-10-CM | POA: Diagnosis not present

## 2024-04-27 DIAGNOSIS — I132 Hypertensive heart and chronic kidney disease with heart failure and with stage 5 chronic kidney disease, or end stage renal disease: Secondary | ICD-10-CM | POA: Insufficient documentation

## 2024-04-27 DIAGNOSIS — E039 Hypothyroidism, unspecified: Secondary | ICD-10-CM | POA: Diagnosis not present

## 2024-04-27 MED ORDER — DILTIAZEM HCL ER COATED BEADS 240 MG PO CP24: 240.0000 mg | ORAL_CAPSULE | Freq: Every day | ORAL | 5 refills | Status: DC

## 2024-04-27 MED ORDER — APIXABAN 5 MG PO TABS
5.0000 mg | ORAL_TABLET | Freq: Two times a day (BID) | ORAL | 5 refills | Status: AC
Start: 2024-04-27 — End: ?

## 2024-04-27 NOTE — Patient Instructions (Signed)
 Increase Diltiazem  to 240 mg daily   Cardioversion scheduled for: June 30 Monday    - Arrive at the [facility] ([address]  and check in with ADMITTING at  9:30   - Do not eat or drink anything after midnight the night prior to your procedure.   - Take all your morning medication (except diabetic medications) with a sip of water prior to arrival.  - Do NOT miss any doses of your blood thinner - if you should miss a dose or take a dose more than 4 hours late -- please notify our office immediately.  - You will not be able to drive home after your procedure. Please ensure you have a responsible adult to drive you home. You will need someone with you for 24 hours post procedure.     - Expect to be in the procedural area approximately 2 hours.   - If you feel as if you go back into normal rhythm prior to scheduled cardioversion, please notify our office immediately.   If your procedure is canceled in the cardioversion suite you will be charged a cancellation fee.

## 2024-04-27 NOTE — Progress Notes (Addendum)
 Primary Care Physician: [name] Clam, MD Primary Cardiologist: Johnny Cash, MD Electrophysiologist: None  Referring Physician: Dr [name] [name] [name] is a 63 y.o. [name] with a history of ESRD, COPD, HTN, CHF, OSA, HLD, hypothyroidism, atrial flutter who presents for follow up in the [facility] Atrial Fibrillation Clinic.  The patient presented to the ED 04/02/24 after developing rapid HR/palpitations while in HD. Per patient, has actually noticed episodes of racing heart for the last year or more. In the ED, patient found to be in atrial flutter with variable ventricular conduction. He was started on diltiazem  and admitted for further management.  Patient was started on Eliquis  for stroke prevention and diltiazem  for rate control.    Patient presents today for follow up for atrial flutter. He reports that he has continued to have palpitations. He was taking two diltiazem  capsules at home to control his heart rates. However, he has run out of the medication. No bleeding issues on anticoagulation. He is not currently on CPAP.   Today, he denies symptoms of chest pain, shortness of breath, orthopnea, PND, lower extremity edema, dizziness, presyncope, syncope, snoring, daytime somnolence, bleeding, or neurologic sequela. The patient is tolerating medications without difficulties and is otherwise without complaint today.    Atrial Fibrillation Risk Factors:  he does have symptoms or diagnosis of sleep apnea. he does not have a history of rheumatic fever. The patient does not have a history of early familial atrial fibrillation or other arrhythmias.  Atrial Fibrillation Management history:  Previous antiarrhythmic drugs: none Previous cardioversions: none Previous ablations: none Anticoagulation history: Eliquis   ROS- All systems are reviewed and negative except as per the HPI above.  Past Medical History:  Diagnosis Date   Adenomatous colon polyp    tubular   Anemia     Asthma    as a child   CHF (congestive heart failure) (HCC)    Chronic kidney disease    COPD (chronic obstructive pulmonary disease) (HCC)    Diverticulosis    External otitis of right ear 11/10/2018   Gout    Hepatitis C    HLD (hyperlipidemia)    Hypertension    OSA (obstructive sleep apnea) 11/03/2015   Sleep apnea    Strep throat 01/2021   Thyroid  disorder     Current Outpatient Medications  Medication Sig Dispense Refill   albuterol  (VENTOLIN  HFA) 108 (90 Base) MCG/ACT inhaler Inhale 2 puffs into the lungs every 6 (six) hours as needed for wheezing or shortness of breath. 18 g 2   allopurinol  (ZYLOPRIM ) 100 MG tablet Take 1 tablet (100 mg total) by mouth every Tuesday, Thursday, and Saturday at 6 PM. 15 tablet 0   apixaban  (ELIQUIS ) 5 MG TABS tablet Take 1 tablet (5 mg total) by mouth 2 (two) times daily. 60 tablet 0   atorvastatin  (LIPITOR) 40 MG tablet Take 1 tablet (40 mg total) by mouth daily. 90 tablet 1   colchicine  0.6 MG tablet TAKE 2 TABLETS BY MOUTH AT THE ONSET OF A GOUT FLARE. MAY REPEAT 1 TABLET IN 1 HOUR IF SYMPTOMS PERSIST 30 tablet 2   diltiazem  (CARDIZEM  CD) 120 MG 24 hr capsule Take 1 capsule (120 mg total) by mouth daily. 30 capsule 0   guaiFENesin  (MUCINEX ) 600 MG 12 hr tablet Take 2 tablets (1,200 mg total) by mouth 2 (two) times daily. 30 tablet 0   ipratropium-albuterol  (DUONEB) 0.5-2.5 (3) MG/3ML SOLN Inhale 3 mLs into the lungs every 6 (six) hours  as needed. 90 mL 3   levothyroxine  (SYNTHROID ) 88 MCG tablet Take 88 mcg by mouth daily.     mometasone -formoterol  (DULERA ) 100-5 MCG/ACT AERO Inhale 2 puffs into the lungs in the morning and at bedtime.     torsemide  (DEMADEX ) 100 MG tablet Take 1 tablet (100 mg total) by mouth daily. 90 tablet 1   triamcinolone  cream (KENALOG ) 0.1 % APPLY  CREAM EXTERNALLY TWICE DAILY 45 g 0   No current facility-administered medications for this encounter.    Physical Exam: BP (!) 132/98   Pulse (!) 124   Ht 6' 1  (1.854 m)   Wt 77.8 kg   BMI 22.64 kg/m   GEN: Well nourished, well developed in no acute distress NECK: No JVD CARDIAC: Irregularly irregular rate and rhythm, no murmurs, rubs, gallops RESPIRATORY:  Clear to auscultation without rales, wheezing or rhonchi  ABDOMEN: Soft, non-tender, non-distended EXTREMITIES:  No edema; No deformity   Wt Readings from Last 3 Encounters:  04/27/24 77.8 kg  04/02/24 78.6 kg  03/16/24 77.6 kg     EKG today demonstrates  Atypical atrial flutter with 2:1 block Vent. rate 124 BPM PR interval * ms QRS duration 80 ms QT/QTcB 314/451 ms   Echo 04/03/24 demonstrated   1. Left ventricular ejection fraction, by estimation, is 60 to 65%. The  left ventricle has normal function. The left ventricle has no regional  wall motion abnormalities. Left ventricular diastolic parameters are  indeterminate.   2. Right ventricular systolic function is normal. The right ventricular  size is mildly enlarged. There is normal pulmonary artery systolic  pressure.   3. Left atrial size was moderately dilated.   4. Mild RA inversion but otherwise no hemodynamic effects. a small  pericardial effusion is present.   5. The mitral valve is normal in structure. No evidence of mitral valve  regurgitation. No evidence of mitral stenosis.   6. The aortic valve is normal in structure. Aortic valve regurgitation is  not visualized. No aortic stenosis is present.   7. The inferior vena cava is normal in size with greater than 50%  respiratory variability, suggesting right atrial pressure of 3 mmHg.     CHA2DS2-VASc Score = 2  The patient's score is based upon: CHF History: 1 HTN History: 1 Diabetes History: 0 Stroke History: 0 Vascular Disease History: 0 Age Score: 0 Gender Score: 0       ASSESSMENT AND PLAN: Typical and atypical atrial flutter The patient's CHA2DS2-VASc score is 2, indicating a 2.2% annual risk of stroke.   General education about atrial flutter  provided and questions answered. We also discussed his stroke risk and the risks and benefits of anticoagulation. Continue Eliquis  5 mg BID Increase diltiazem  to 240 mg daily We discussed rhythm control options today. Would like a trial of SR. After discussing the risks and benefits, will arrange for DCCV. If he has quick return of afib, his AAD options are limited to amiodarone with ESRD.   Secondary Hypercoagulable State (ICD10:  D68.69) The patient is at significant risk for stroke/thromboembolism based upon his CHA2DS2-VASc Score of 2.  Continue Apixaban  (Eliquis ). No bleeding issues.  Chronic HFpEF EF 60-65% Fluid status appears stable today, managed by HD  OSA  Diagnosed 2017, never on CPAP.  The importance of adequate treatment of sleep apnea was discussed today in order to improve our ability to maintain sinus rhythm long term. Patient agreeable to referral for evaluation. Previously seen by [facility].   ESRD  HD on TTS    Follow up in the AF clinic post DCCV.   Informed Consent   Shared Decision Making/Informed Consent The risks (stroke, cardiac arrhythmias rarely resulting in the need for a temporary or permanent pacemaker, skin irritation or burns and complications associated with conscious sedation including aspiration, arrhythmia, respiratory failure and death), benefits (restoration of normal sinus rhythm) and alternatives of a direct current cardioversion were explained in detail to Mr. Nicklaus and he agrees to proceed.         [facility] [address] [place], [place] [address] [phone]

## 2024-04-27 NOTE — H&P (View-Only) (Signed)
 Primary Care Physician: Johnny Clam, MD Primary Cardiologist: Johnny Cash, MD Electrophysiologist: None  Referring Physician: Dr Johnny [name] Johnny is a 63 y.o. male with a history of ESRD, COPD, HTN, CHF, OSA, HLD, hypothyroidism, atrial flutter who presents for follow up in the [facility].  The patient presented to the ED 04/02/24 after developing rapid HR/palpitations while in HD. Per patient, has actually noticed episodes of racing heart for the last year or more. In the ED, patient found to be in atrial flutter with variable ventricular conduction. He was started on diltiazem  and admitted for further management.  Patient was started on Eliquis  for stroke prevention and diltiazem  for rate control.    Patient presents today for follow up for atrial flutter. He reports that he has continued to have palpitations. He was taking two diltiazem  capsules at home to control his heart rates. However, he has run out of the medication. No bleeding issues on anticoagulation. He is not currently on CPAP.   Today, he denies symptoms of chest pain, shortness of breath, orthopnea, PND, lower extremity edema, dizziness, presyncope, syncope, snoring, daytime somnolence, bleeding, or neurologic sequela. The patient is tolerating medications without difficulties and is otherwise without complaint today.    Atrial Fibrillation Risk Factors:  he does have symptoms or diagnosis of sleep apnea. he does not have a history of rheumatic fever. The patient does not have a history of early familial atrial fibrillation or other arrhythmias.  Atrial Fibrillation Management history:  Previous antiarrhythmic drugs: none Previous cardioversions: none Previous ablations: none Anticoagulation history: Eliquis   ROS- All systems are reviewed and negative except as per the HPI above.  Past Medical History:  Diagnosis Date   Adenomatous colon polyp    tubular   Anemia     Asthma    as a child   CHF (congestive heart failure) (HCC)    Chronic kidney disease    COPD (chronic obstructive pulmonary disease) (HCC)    Diverticulosis    External otitis of right ear 11/10/2018   Gout    Hepatitis C    HLD (hyperlipidemia)    Hypertension    OSA (obstructive sleep apnea) 11/03/2015   Sleep apnea    Strep throat 01/2021   Thyroid  disorder     Current Outpatient Medications  Medication Sig Dispense Refill   albuterol  (VENTOLIN  HFA) 108 (90 Base) MCG/ACT inhaler Inhale 2 puffs into the lungs every 6 (six) hours as needed for wheezing or shortness of breath. 18 g 2   allopurinol  (ZYLOPRIM ) 100 MG tablet Take 1 tablet (100 mg total) by mouth every Tuesday, Thursday, and Saturday at 6 PM. 15 tablet 0   apixaban  (ELIQUIS ) 5 MG TABS tablet Take 1 tablet (5 mg total) by mouth 2 (two) times daily. 60 tablet 0   atorvastatin  (LIPITOR) 40 MG tablet Take 1 tablet (40 mg total) by mouth daily. 90 tablet 1   colchicine  0.6 MG tablet TAKE 2 TABLETS BY MOUTH AT THE ONSET OF A GOUT FLARE. MAY REPEAT 1 TABLET IN 1 HOUR IF SYMPTOMS PERSIST 30 tablet 2   diltiazem  (CARDIZEM  CD) 120 MG 24 hr capsule Take 1 capsule (120 mg total) by mouth daily. 30 capsule 0   guaiFENesin  (MUCINEX ) 600 MG 12 hr tablet Take 2 tablets (1,200 mg total) by mouth 2 (two) times daily. 30 tablet 0   ipratropium-albuterol  (DUONEB) 0.5-2.5 (3) MG/3ML SOLN Inhale 3 mLs into the lungs every 6 (six) hours  as needed. 90 mL 3   levothyroxine  (SYNTHROID ) 88 MCG tablet Take 88 mcg by mouth daily.     mometasone -formoterol  (DULERA ) 100-5 MCG/ACT AERO Inhale 2 puffs into the lungs in the morning and at bedtime.     torsemide  (DEMADEX ) 100 MG tablet Take 1 tablet (100 mg total) by mouth daily. 90 tablet 1   triamcinolone  cream (KENALOG ) 0.1 % APPLY  CREAM EXTERNALLY TWICE DAILY 45 g 0   No current facility-administered medications for this encounter.    Physical Exam: BP (!) 132/98   Pulse (!) 124   Ht 6' 1  (1.854 m)   Wt 77.8 kg   BMI 22.64 kg/m   GEN: Well nourished, well developed in no acute distress NECK: No JVD CARDIAC: Irregularly irregular rate and rhythm, no murmurs, rubs, gallops RESPIRATORY:  Clear to auscultation without rales, wheezing or rhonchi  ABDOMEN: Soft, non-tender, non-distended EXTREMITIES:  No edema; No deformity   Wt Readings from Last 3 Encounters:  04/27/24 77.8 kg  04/02/24 78.6 kg  03/16/24 77.6 kg     EKG today demonstrates  Atypical atrial flutter with 2:1 block Vent. rate 124 BPM PR interval * ms QRS duration 80 ms QT/QTcB 314/451 ms   Echo 04/03/24 demonstrated   1. Left ventricular ejection fraction, by estimation, is 60 to 65%. The  left ventricle has normal function. The left ventricle has no regional  wall motion abnormalities. Left ventricular diastolic parameters are  indeterminate.   2. Right ventricular systolic function is normal. The right ventricular  size is mildly enlarged. There is normal pulmonary artery systolic  pressure.   3. Left atrial size was moderately dilated.   4. Mild RA inversion but otherwise no hemodynamic effects. a small  pericardial effusion is present.   5. The mitral valve is normal in structure. No evidence of mitral valve  regurgitation. No evidence of mitral stenosis.   6. The aortic valve is normal in structure. Aortic valve regurgitation is  not visualized. No aortic stenosis is present.   7. The inferior vena cava is normal in size with greater than 50%  respiratory variability, suggesting right atrial pressure of 3 mmHg.     CHA2DS2-VASc Score = 2  The patient's score is based upon: CHF History: 1 HTN History: 1 Diabetes History: 0 Stroke History: 0 Vascular Disease History: 0 Age Score: 0 Gender Score: 0       ASSESSMENT AND PLAN: Typical and atypical atrial flutter The patient's CHA2DS2-VASc score is 2, indicating a 2.2% annual risk of stroke.   General education about atrial flutter  provided and questions answered. We also discussed his stroke risk and the risks and benefits of anticoagulation. Continue Eliquis  5 mg BID Increase diltiazem  to 240 mg daily We discussed rhythm control options today. Would like a trial of SR. After discussing the risks and benefits, will arrange for DCCV. If he has quick return of afib, his AAD options are limited to amiodarone with ESRD.   Secondary Hypercoagulable State (ICD10:  D68.69) The patient is at significant risk for stroke/thromboembolism based upon his CHA2DS2-VASc Score of 2.  Continue Apixaban  (Eliquis ). No bleeding issues.  Chronic HFpEF EF 60-65% Fluid status appears stable today, managed by HD  OSA  Diagnosed 2017, never on CPAP.  The importance of adequate treatment of sleep apnea was discussed today in order to improve our ability to maintain sinus rhythm long term. Patient agreeable to referral for evaluation. Previously seen by [facility].   ESRD  HD on TTS    Follow up in the AF clinic post DCCV.   Informed Consent   Shared Decision Making/Informed Consent The risks (stroke, cardiac arrhythmias rarely resulting in the need for a temporary or permanent pacemaker, skin irritation or burns and complications associated with conscious sedation including aspiration, arrhythmia, respiratory failure and death), benefits (restoration of normal sinus rhythm) and alternatives of a direct current cardioversion were explained in detail to Mr. Nicklaus and he agrees to proceed.         [facility] [address] [place], [place] [address] [phone]

## 2024-04-29 ENCOUNTER — Other Ambulatory Visit: Payer: Self-pay | Admitting: Physician Assistant

## 2024-04-29 ENCOUNTER — Ambulatory Visit
Admission: RE | Admit: 2024-04-29 | Discharge: 2024-04-29 | Disposition: A | Discharge status: Home / Self Care | Payer: MEDICAID | Source: Ambulatory Visit | Attending: Physician Assistant | Admitting: Physician Assistant

## 2024-04-29 DIAGNOSIS — N6342 Unspecified lump in left breast, subareolar: Secondary | ICD-10-CM

## 2024-04-29 DIAGNOSIS — N6341 Unspecified lump in right breast, subareolar: Secondary | ICD-10-CM

## 2024-05-01 NOTE — Progress Notes (Signed)
 Pt called for pre procedure instructions. Arrival time 0930 NPO after midnight explained Instructed to take am meds with sip of water and confirmed blood thinner consistency, Eliquis  Instructed pt need for ride home tomorrow and have responsible adult with them for 24 hrs post procedure.

## 2024-05-04 ENCOUNTER — Encounter (HOSPITAL_COMMUNITY)
Admission: RE | Disposition: A | Discharge status: Home / Self Care | Payer: Self-pay | Source: Home / Self Care | Attending: Internal Medicine

## 2024-05-04 ENCOUNTER — Ambulatory Visit (HOSPITAL_COMMUNITY): Payer: MEDICAID

## 2024-05-04 ENCOUNTER — Ambulatory Visit (HOSPITAL_BASED_OUTPATIENT_CLINIC_OR_DEPARTMENT_OTHER): Payer: MEDICAID

## 2024-05-04 ENCOUNTER — Ambulatory Visit (HOSPITAL_COMMUNITY)
Admission: RE | Admit: 2024-05-04 | Discharge: 2024-05-04 | Disposition: A | Discharge status: Home / Self Care | Payer: MEDICAID | Attending: Internal Medicine | Admitting: Internal Medicine

## 2024-05-04 ENCOUNTER — Other Ambulatory Visit: Payer: Self-pay

## 2024-05-04 ENCOUNTER — Encounter (HOSPITAL_COMMUNITY): Payer: Self-pay | Admitting: Internal Medicine

## 2024-05-04 DIAGNOSIS — I484 Atypical atrial flutter: Secondary | ICD-10-CM | POA: Diagnosis not present

## 2024-05-04 DIAGNOSIS — I4819 Other persistent atrial fibrillation: Secondary | ICD-10-CM | POA: Diagnosis present

## 2024-05-04 DIAGNOSIS — J449 Chronic obstructive pulmonary disease, unspecified: Secondary | ICD-10-CM | POA: Diagnosis not present

## 2024-05-04 DIAGNOSIS — I4891 Unspecified atrial fibrillation: Secondary | ICD-10-CM

## 2024-05-04 DIAGNOSIS — I081 Rheumatic disorders of both mitral and tricuspid valves: Secondary | ICD-10-CM | POA: Diagnosis not present

## 2024-05-04 DIAGNOSIS — Z87891 Personal history of nicotine dependence: Secondary | ICD-10-CM | POA: Diagnosis not present

## 2024-05-04 DIAGNOSIS — I4892 Unspecified atrial flutter: Secondary | ICD-10-CM

## 2024-05-04 DIAGNOSIS — N186 End stage renal disease: Secondary | ICD-10-CM

## 2024-05-04 DIAGNOSIS — G4733 Obstructive sleep apnea (adult) (pediatric): Secondary | ICD-10-CM | POA: Diagnosis not present

## 2024-05-04 DIAGNOSIS — I132 Hypertensive heart and chronic kidney disease with heart failure and with stage 5 chronic kidney disease, or end stage renal disease: Secondary | ICD-10-CM | POA: Diagnosis not present

## 2024-05-04 DIAGNOSIS — I361 Nonrheumatic tricuspid (valve) insufficiency: Secondary | ICD-10-CM

## 2024-05-04 DIAGNOSIS — D6869 Other thrombophilia: Secondary | ICD-10-CM | POA: Insufficient documentation

## 2024-05-04 DIAGNOSIS — I5033 Acute on chronic diastolic (congestive) heart failure: Secondary | ICD-10-CM

## 2024-05-04 DIAGNOSIS — I340 Nonrheumatic mitral (valve) insufficiency: Secondary | ICD-10-CM | POA: Diagnosis not present

## 2024-05-04 DIAGNOSIS — I483 Typical atrial flutter: Secondary | ICD-10-CM | POA: Diagnosis not present

## 2024-05-04 DIAGNOSIS — I5032 Chronic diastolic (congestive) heart failure: Secondary | ICD-10-CM | POA: Diagnosis not present

## 2024-05-04 DIAGNOSIS — Z7901 Long term (current) use of anticoagulants: Secondary | ICD-10-CM | POA: Diagnosis not present

## 2024-05-04 HISTORY — PX: TRANSESOPHAGEAL ECHOCARDIOGRAM (CATH LAB): EP1270

## 2024-05-04 HISTORY — PX: CARDIOVERSION: EP1203

## 2024-05-04 LAB — POCT I-STAT, CHEM 8
BUN: 66 mg/dL — ABNORMAL HIGH (ref 8–23)
Calcium, Ion: 1.11 mmol/L — ABNORMAL LOW (ref 1.15–1.40)
Chloride: 102 mmol/L (ref 98–111)
Creatinine, Ser: 9.6 mg/dL — ABNORMAL HIGH (ref 0.61–1.24)
Glucose, Bld: 86 mg/dL (ref 70–99)
HCT: 28 % — ABNORMAL LOW (ref 39.0–52.0)
Hemoglobin: 9.5 g/dL — ABNORMAL LOW (ref 13.0–17.0)
Potassium: 4.9 mmol/L (ref 3.5–5.1)
Sodium: 138 mmol/L (ref 135–145)
TCO2: 27 mmol/L (ref 22–32)

## 2024-05-04 LAB — ECHO TEE
AR max vel: 1.33 cm2
AV Area VTI: 1.09 cm2
AV Area mean vel: 1.39 cm2
AV Mean grad: 3 mmHg
AV Peak grad: 5.7 mmHg
Ao pk vel: 1.19 m/s

## 2024-05-04 SURGERY — CARDIOVERSION (CATH LAB)
Anesthesia: General

## 2024-05-04 MED ORDER — PROPOFOL 10 MG/ML IV BOLUS
INTRAVENOUS | Status: DC | PRN
  Administered 2024-05-04: 20 mg via INTRAVENOUS
  Administered 2024-05-04: 30 mg via INTRAVENOUS
  Administered 2024-05-04 (×2): 20 mg via INTRAVENOUS
  Administered 2024-05-04: 60 mg via INTRAVENOUS

## 2024-05-04 MED ORDER — APIXABAN 5 MG PO TABS
5.0000 mg | ORAL_TABLET | Freq: Once | ORAL | Status: AC
  Administered 2024-05-04: 5 mg via ORAL

## 2024-05-04 MED ORDER — SODIUM CHLORIDE 0.9% FLUSH: 3.0000 mL | INTRAVENOUS | Status: DC | PRN

## 2024-05-04 MED ORDER — SODIUM CHLORIDE 0.9% FLUSH: 3.0000 mL | Freq: Two times a day (BID) | INTRAVENOUS | Status: DC

## 2024-05-04 MED ORDER — APIXABAN 5 MG PO TABS
ORAL_TABLET | ORAL | Status: DC
Start: 2024-05-04 — End: 2024-05-04
  Filled 2024-05-04: qty 1

## 2024-05-04 MED ORDER — LIDOCAINE 2% (20 MG/ML) 5 ML SYRINGE
INTRAMUSCULAR | Status: DC | PRN
  Administered 2024-05-04: 60 mg via INTRAVENOUS

## 2024-05-04 MED ORDER — SODIUM CHLORIDE 0.9 % IV SOLN: INTRAVENOUS | Status: DC | PRN

## 2024-05-04 MED ORDER — PROPOFOL 500 MG/50ML IV EMUL
INTRAVENOUS | Status: DC | PRN
  Administered 2024-05-04: 150 ug/kg/min via INTRAVENOUS

## 2024-05-04 SURGICAL SUPPLY — 1 items: PAD DEFIB RADIO PHYSIO CONN (PAD) ×3 IMPLANT

## 2024-05-04 NOTE — Anesthesia Preprocedure Evaluation (Signed)
 Anesthesia Evaluation    Reviewed: Allergy & Precautions, Patient's Chart, lab work & pertinent test results  Airway Mallampati: II  TM Distance: >3 FB Neck ROM: Full    Dental no notable dental hx.    Pulmonary sleep apnea , COPD, former smoker   Pulmonary exam normal breath sounds clear to auscultation       Cardiovascular hypertension, Pt. on medications +CHF  Normal cardiovascular exam+ dysrhythmias Atrial Fibrillation  Rhythm:Regular Rate:Normal  09/2022 Echo 1. Left ventricular ejection fraction, by estimation, is 55 to 60% . The left ventricle has normal function. The left ventricle has no regional wall motion abnormalities. Left ventricular diastolic parameters are consistent with Grade II diastolic dysfunction ( pseudonormalization) .  2. Right ventricular systolic function is normal. The right ventricular size is mildly enlarged. There is normal pulmonary artery systolic pressure. The estimated right ventricular systolic pressure is 31. 1 mmHg.  3. Left atrial size was mild to moderately dilated.  4. A small pericardial effusion is present. The pericardial effusion is circumferential. 5. The mitral valve is grossly normal. Mild mitral valve regurgitation. No evidence of mitral stenosis.  6. The aortic valve is tricuspid. Aortic valve regurgitation is not visualized. No aortic stenosis is present.  7. The inferior vena cava is normal in size with greater than 50% respiratory variability, suggesting right atrial pressure of 3 mmHg.   Neuro/Psych    GI/Hepatic ,,,(+) Hepatitis - (hx), C  Endo/Other  Hypothyroidism    Renal/GU ESRF and DialysisRenal disease     Musculoskeletal   Abdominal   Peds  Hematology  (+) Blood dyscrasia, anemia Pt requires DDAVP   Anesthesia Other Findings All: Heparin , Naproxen  Reproductive/Obstetrics                             Anesthesia Physical Anesthesia  Plan  ASA: 4  Anesthesia Plan: General   Post-op Pain Management: Minimal or no pain anticipated   Induction: Intravenous  PONV Risk Score and Plan: 2 and Treatment may vary due to age or medical condition and Propofol infusion  Airway Management Planned: Mask  Additional Equipment: None  Intra-op Plan:   Post-operative Plan:   Informed Consent:      Dental advisory given  Plan Discussed with:   Anesthesia Plan Comments:         Anesthesia Quick Evaluation

## 2024-05-04 NOTE — Interval H&P Note (Signed)
 History and Physical Interval Note:  05/04/2024 9:46 AM  Johnny [name]  has presented today for surgery, with the diagnosis of afib.  The various methods of treatment have been discussed with the patient and family. After consideration of risks, benefits and other options for treatment, the patient has consented to  Procedure(s): CARDIOVERSION (N/A) as a surgical intervention.  The patient's history has been reviewed, patient examined, no change in status, stable for surgery.  I have reviewed the patient's chart and labs.  Questions were answered to the patient's satisfaction.  Reviewed with Dr. Das missed anti-coagulation doses.    After careful review of history and examination, the risks and benefits of transesophageal echocardiogram have been explained including risks of esophageal damage, perforation (1:10,000 risk), bleeding, pharyngeal hematoma as well as other potential complications associated with conscious sedation including aspiration, arrhythmia, respiratory failure and death. Alternatives to treatment were discussed, questions were answered. Patient is willing to proceed.   Updated to TEE/DCCV.    [name] A Naria Abbey

## 2024-05-04 NOTE — Transfer of Care (Signed)
 Immediate Anesthesia Transfer of Care Note  Patient: Johnny [name]  Procedure(s) Performed: CARDIOVERSION TRANSESOPHAGEAL ECHOCARDIOGRAM (Left)  Patient Location: PACU and Cath Lab  Anesthesia Type:MAC  Level of Consciousness: drowsy  Airway & Oxygen Therapy: Patient Spontanous Breathing and Patient connected to face mask oxygen  Post-op Assessment: Report given to RN and Post -op Vital signs reviewed and stable  Post vital signs: Reviewed and stable  Last Vitals:  Vitals Value Taken Time  BP 137/75 05/04/24 11:11  Temp    Pulse 89 05/04/24 11:11  Resp 36 05/04/24 11:11  SpO2 100 % 05/04/24 11:11  Vitals shown include unfiled device data.  Last Pain:  Vitals:   05/04/24 1042  TempSrc:   PainSc: 0-No pain         Complications: No notable events documented.

## 2024-05-04 NOTE — Progress Notes (Signed)
 Echocardiogram Echocardiogram Transesophageal has been performed.  Koleen KANDICE Popper, RDCS 05/04/2024, 11:25 AM

## 2024-05-04 NOTE — Anesthesia Postprocedure Evaluation (Signed)
 Anesthesia Post Note  Patient: Johnny [name]  Procedure(s) Performed: CARDIOVERSION TRANSESOPHAGEAL ECHOCARDIOGRAM (Left)     Patient location during evaluation: PACU Anesthesia Type: General Level of consciousness: awake and alert Pain management: pain level controlled Vital Signs Assessment: post-procedure vital signs reviewed and stable Respiratory status: spontaneous breathing, nonlabored ventilation and respiratory function stable Cardiovascular status: blood pressure returned to baseline and stable Postop Assessment: no apparent nausea or vomiting Anesthetic complications: no   No notable events documented.  Last Vitals:  Vitals:   05/04/24 1130 05/04/24 1145  BP: (!) 144/88 (!) 145/103  Pulse: 82 85  Resp: (!) 24 13  Temp:    SpO2: 100% 98%    Last Pain:  Vitals:   05/04/24 1042  TempSrc:   PainSc: 0-No pain                 [name]

## 2024-05-04 NOTE — CV Procedure (Signed)
   TRANSESOPHAGEAL ECHOCARDIOGRAM GUIDED DIRECT CURRENT CARDIOVERSION  NAME:  Johnny [name]    MRN: [id number] DOB:  [birth year]    ADMIT DATE: 05/04/2024  INDICATIONS: Symptomatic atrial flutter   PROCEDURE:   Informed consent was obtained prior to the procedure. The risks, benefits and alternatives for the procedure were discussed and the patient comprehended these risks.  Risks include, but are not limited to, cough, sore throat, vomiting, nausea, somnolence, esophageal and stomach trauma or perforation, bleeding, low blood pressure, aspiration, pneumonia, infection, trauma to the teeth and death.    After a procedural time-out, the oropharynx was anesthetized and the patient was sedated by the anesthesia service. The transesophageal probe was inserted in the esophagus and stomach without difficulty and multiple views were obtained. Anesthesia was monitored by Dr. Cleotilde.   COMPLICATIONS:     Complications: No complications Patient tolerated procedure well.  KEY FINDINGS:  Severe decrease in left ventricular function. Moderate right ventricular dysfunction.  Full Report to follow.   CARDIOVERSION:     Indications:  Symptomatic Atrial Flutter  Procedure Details:  Once the TEE was complete, the patient had the defibrillator pads placed in the anterior and posterior position. Once an appropriate level of sedation was confirmed, the patient was cardioverted x 1 with 200J of biphasic synchronized energy.  The patient converted to NSR.  There were no apparent complications.  The patient had normal neuro status and respiratory status post procedure with vitals stable as recorded elsewhere.  Adequate airway was maintained throughout and vital signs monitored per protocol.  Will send courtesy message to his cardiac team.  Long term, diltiazem  may not be a great long term medication unless this has been thought to be solely related to Tachy-mediated cardiomyopathy.  Patient may be benefit  from transition to succinate.  In the setting of his COPD and ESRD, no medication changes were made today  Stanly Leavens, MD [place]  [facility]  11:07 AM

## 2024-05-11 ENCOUNTER — Encounter (HOSPITAL_COMMUNITY): Payer: Self-pay

## 2024-05-18 ENCOUNTER — Encounter (HOSPITAL_COMMUNITY): Payer: Self-pay | Admitting: Physician Assistant

## 2024-05-18 ENCOUNTER — Ambulatory Visit (HOSPITAL_COMMUNITY)
Admission: RE | Admit: 2024-05-18 | Discharge: 2024-05-18 | Disposition: A | Discharge status: Home / Self Care | Payer: MEDICAID | Source: Ambulatory Visit | Attending: Physician Assistant | Admitting: Physician Assistant

## 2024-05-18 VITALS — BP 122/90 | HR 70 | Ht 73.0 in | Wt 175.6 lb

## 2024-05-18 DIAGNOSIS — I5022 Chronic systolic (congestive) heart failure: Secondary | ICD-10-CM | POA: Diagnosis not present

## 2024-05-18 DIAGNOSIS — E039 Hypothyroidism, unspecified: Secondary | ICD-10-CM | POA: Diagnosis not present

## 2024-05-18 DIAGNOSIS — Z79899 Other long term (current) drug therapy: Secondary | ICD-10-CM | POA: Diagnosis not present

## 2024-05-18 DIAGNOSIS — J449 Chronic obstructive pulmonary disease, unspecified: Secondary | ICD-10-CM | POA: Insufficient documentation

## 2024-05-18 DIAGNOSIS — Z7901 Long term (current) use of anticoagulants: Secondary | ICD-10-CM | POA: Diagnosis not present

## 2024-05-18 DIAGNOSIS — I120 Hypertensive chronic kidney disease with stage 5 chronic kidney disease or end stage renal disease: Secondary | ICD-10-CM | POA: Diagnosis not present

## 2024-05-18 DIAGNOSIS — I484 Atypical atrial flutter: Secondary | ICD-10-CM | POA: Diagnosis not present

## 2024-05-18 DIAGNOSIS — D6869 Other thrombophilia: Secondary | ICD-10-CM | POA: Insufficient documentation

## 2024-05-18 DIAGNOSIS — E785 Hyperlipidemia, unspecified: Secondary | ICD-10-CM | POA: Diagnosis not present

## 2024-05-18 DIAGNOSIS — G4733 Obstructive sleep apnea (adult) (pediatric): Secondary | ICD-10-CM | POA: Insufficient documentation

## 2024-05-18 DIAGNOSIS — N186 End stage renal disease: Secondary | ICD-10-CM | POA: Diagnosis not present

## 2024-05-18 DIAGNOSIS — I483 Typical atrial flutter: Secondary | ICD-10-CM | POA: Diagnosis not present

## 2024-05-18 DIAGNOSIS — I5020 Unspecified systolic (congestive) heart failure: Secondary | ICD-10-CM | POA: Diagnosis not present

## 2024-05-18 MED ORDER — AMIODARONE HCL 200 MG PO TABS
ORAL_TABLET | ORAL | 2 refills | Status: AC
Start: 2024-05-18 — End: 2025-06-17

## 2024-05-18 MED ORDER — METOPROLOL SUCCINATE ER 50 MG PO TB24: 50.0000 mg | ORAL_TABLET | Freq: Every day | ORAL | 11 refills | Status: AC

## 2024-05-18 NOTE — Patient Instructions (Signed)
 Stop Diltiazem    Start Toprol  50 mg once a day   Start Amiodarone  200 mg twice a day for 30 days then decrease to 200 mg once a day

## 2024-05-18 NOTE — Progress Notes (Signed)
 Primary Care Physician: Delbert Clam, MD Primary Cardiologist: Lonni Cash, MD Electrophysiologist: None  Referring Physician: Dr Cash [name] [name] is a 63 y.o. male with a history of ESRD, COPD, HTN, CHF, OSA, HLD, hypothyroidism, atrial flutter who presents for follow up in the [facility].  The patient presented to the ED 04/02/24 after developing rapid HR/palpitations while in HD. Per patient, has actually noticed episodes of racing heart for the last year or more. In the ED, patient found to be in atrial flutter with variable ventricular conduction. He was started on diltiazem  and admitted for further management.  Patient was started on Eliquis  for stroke prevention and diltiazem  for rate control.    Patient returns for follow up for atrial fibrillation. He is s/p TEE guided DCCV on 05/04/24. TEE showed severely reduced EF 20-25%. He feels well today and is not aware of his arrhythmia. No bleeding issues on anticoagulation.   Today, he  denies symptoms of palpitations, chest pain, shortness of breath, orthopnea, PND, lower extremity edema, dizziness, presyncope, syncope, bleeding, or neurologic sequela. The patient is tolerating medications without difficulties and is otherwise without complaint today.    Atrial Fibrillation Risk Factors:  he does have symptoms or diagnosis of sleep apnea. he does not have a history of rheumatic fever. The patient does not have a history of early familial atrial fibrillation or other arrhythmias.  Atrial Fibrillation Management history:  Previous antiarrhythmic drugs: none Previous cardioversions: 05/04/24 Previous ablations: none Anticoagulation history: Eliquis   ROS- All systems are reviewed and negative except as per the HPI above.  Past Medical History:  Diagnosis Date   Adenomatous colon polyp    tubular   Anemia    Asthma    as a child   CHF (congestive heart failure) (HCC)    Chronic  kidney disease    COPD (chronic obstructive pulmonary disease) (HCC)    Diverticulosis    External otitis of right ear 11/10/2018   Gout    Hepatitis C    HLD (hyperlipidemia)    Hypertension    OSA (obstructive sleep apnea) 11/03/2015   Sleep apnea    Strep throat 01/2021   Thyroid  disorder     Current Outpatient Medications  Medication Sig Dispense Refill   albuterol  (VENTOLIN  HFA) 108 (90 Base) MCG/ACT inhaler Inhale 2 puffs into the lungs every 6 (six) hours as needed for wheezing or shortness of breath. 18 g 2   allopurinol  (ZYLOPRIM ) 100 MG tablet Take 1 tablet (100 mg total) by mouth every Tuesday, Thursday, and Saturday at 6 PM. 15 tablet 0   apixaban  (ELIQUIS ) 5 MG TABS tablet Take 1 tablet (5 mg total) by mouth 2 (two) times daily. 60 tablet 5   atorvastatin  (LIPITOR) 40 MG tablet Take 1 tablet (40 mg total) by mouth daily. 90 tablet 1   colchicine  0.6 MG tablet TAKE 2 TABLETS BY MOUTH AT THE ONSET OF A GOUT FLARE. MAY REPEAT 1 TABLET IN 1 HOUR IF SYMPTOMS PERSIST 30 tablet 2   diltiazem  (CARDIZEM  CD) 240 MG 24 hr capsule Take 1 capsule (240 mg total) by mouth daily. 30 capsule 5   guaiFENesin  (MUCINEX ) 600 MG 12 hr tablet Take 2 tablets (1,200 mg total) by mouth 2 (two) times daily. 30 tablet 0   ipratropium-albuterol  (DUONEB) 0.5-2.5 (3) MG/3ML SOLN Inhale 3 mLs into the lungs every 6 (six) hours as needed. 90 mL 3   levothyroxine  (SYNTHROID ) 88 MCG tablet Take  88 mcg by mouth daily.     mometasone -formoterol  (DULERA ) 100-5 MCG/ACT AERO Inhale 2 puffs into the lungs in the morning and at bedtime.     torsemide  (DEMADEX ) 100 MG tablet Take 1 tablet (100 mg total) by mouth daily. 90 tablet 1   triamcinolone  cream (KENALOG ) 0.1 % APPLY  CREAM EXTERNALLY TWICE DAILY 45 g 0   No current facility-administered medications for this encounter.    Physical Exam: BP (!) 122/90   Pulse 70   Ht 6' 1 (1.854 m)   Wt 79.7 kg   BMI 23.17 kg/m   GEN: Well nourished, well developed  in no acute distress NECK: No JVD CARDIAC: Irregularly irregular rate and rhythm, no murmurs, rubs, gallops RESPIRATORY:  Clear to auscultation without rales, wheezing or rhonchi  ABDOMEN: Soft, non-tender, non-distended EXTREMITIES:  No edema; No deformity    Wt Readings from Last 3 Encounters:  05/18/24 79.7 kg  04/27/24 77.8 kg  04/02/24 78.6 kg     EKG today demonstrates  Typical atrial flutter with variable block, PVCs Vent. rate 70 BPM PR interval * ms QRS duration 84 ms QT/QTcB 418/451 ms   Echo 04/03/24 demonstrated   1. Left ventricular ejection fraction, by estimation, is 60 to 65%. The  left ventricle has normal function. The left ventricle has no regional  wall motion abnormalities. Left ventricular diastolic parameters are  indeterminate.   2. Right ventricular systolic function is normal. The right ventricular  size is mildly enlarged. There is normal pulmonary artery systolic  pressure.   3. Left atrial size was moderately dilated.   4. Mild RA inversion but otherwise no hemodynamic effects. a small  pericardial effusion is present.   5. The mitral valve is normal in structure. No evidence of mitral valve  regurgitation. No evidence of mitral stenosis.   6. The aortic valve is normal in structure. Aortic valve regurgitation is  not visualized. No aortic stenosis is present.   7. The inferior vena cava is normal in size with greater than 50%  respiratory variability, suggesting right atrial pressure of 3 mmHg.   TEE 05/04/24  1. Left ventricular ejection fraction, by estimation, is 20 to 25%. Left  ventricular ejection fraction by 3D volume is 24 %. The left ventricle has  severely decreased function.   2. Right ventricular systolic function is moderately reduced. The right  ventricular size is normal.   3. Chicken wing appendage 22 mm X 21 mm on 3D MPR. Left atrial size was  moderately dilated. No left atrial/left atrial appendage thrombus was  detected.    4. Right atrial size was mildly dilated.   5. The mitral valve is grossly normal. Mild mitral valve regurgitation.  No evidence of mitral stenosis.   6. The aortic valve is tricuspid. Aortic valve regurgitation is not  visualized. No aortic stenosis is present.   7. 3D performed of the LAA and demonstrates Chicken wing appendage 22 mm X 21 mm on 3D MPR.   8. Gatsric contents seen in the transgastric views. Limited additional  imaging performed after this finding.     CHA2DS2-VASc Score = 2  The patient's score is based upon: CHF History: 1 HTN History: 1 Diabetes History: 0 Stroke History: 0 Vascular Disease History: 0 Age Score: 0 Gender Score: 0       ASSESSMENT AND PLAN: Typical and atypical atrial flutter The patient's CHA2DS2-VASc score is 2, indicating a 2.2% annual risk of stroke.   S/p DCCV on  05/04/24 with quick return of atrial flutter. We discussed rhythm control options. With ESRD amiodarone  is our best AAD option. Will start amiodarone  200 mg BID x 4 weeks then decrease to once daily. Will plan for repeat DCCV if he does not chemically convert. Reviewed case with EP, if he fails amiodarone  then could possible be considered for ablation.  Continue Eliquis  5 mg BID With reduced EF will transition diltiazem  to Toprol  50 mg daily given reduced EF.   Secondary Hypercoagulable State (ICD10:  D68.69) The patient is at significant risk for stroke/thromboembolism based upon his CHA2DS2-VASc Score of 2.  Continue Apixaban  (Eliquis ).   Chronic HFrEF EF 20-25% on TEE Fluid status appears stable today Will refer to establish care with a primary cardiologist.   OSA  Diagnosed 2017, never started CPAP Previously seen at [facility], has visit on 7/16 to reestablish care.   ESRD HD on TTS   Follow up in the AF clinic in 2-3 weeks.     [facility] [address] [place], [place] [address] [phone]

## 2024-05-20 ENCOUNTER — Ambulatory Visit: Payer: MEDICAID

## 2024-05-20 VITALS — BP 134/86 | HR 91 | Temp 97.9°F | Ht 73.0 in | Wt 176.4 lb

## 2024-05-20 DIAGNOSIS — G4733 Obstructive sleep apnea (adult) (pediatric): Secondary | ICD-10-CM

## 2024-05-20 DIAGNOSIS — I4891 Unspecified atrial fibrillation: Secondary | ICD-10-CM

## 2024-05-20 NOTE — Patient Instructions (Signed)
 Complete home sleep test as discussed.  Follow up in clinic in 6-8 weeks for results review.

## 2024-05-20 NOTE — Assessment & Plan Note (Signed)
-   Plan for home sleep test to reevaluate sleep apnea - Follow-up in clinic for results

## 2024-05-20 NOTE — Progress Notes (Signed)
 @Patient  ID: Johnny [name], male    DOB: August 26, 1961, 63 y.o.   MRN: [id number]  Chief Complaint  Patient presents with   Consult    Sleep consult Has had a sleep study before, they ordered him a cpap or a bipap but pt opted out because it was uncomfortable. Sleep study done three years prior, off of [address]. Does not have a DME. Did not use cpap/bipap at all.    Referring provider: Nellene Quita SAUNDERS, PA  HPI: Patient is a 63 year old male with past medical history of hypertension, chronic diastolic heart failure, atrial flutter, COPD, and OSA who presents as a new patient consult to establish care for sleep.  He has been seen in our clinic and completed a sleep test back in 2016.  At that time his AHI was 7/hr, and AutoCPAP was recommended with a range of 5-15 cm H20.  He did not end up starting the machine as he felt he would not tolerate the mask.  Continues to have issues with snoring, fatigue, and excessive daytime sleepiness.  He has also had atrial fibrillation for years which has not responded to multiple attempts at cardioversion.  He reports that he takes naps during the daytime, and can easily fall asleep while reading, watching TV, or sometimes when driving.  He denies chest pain, dyspnea, fever, chills, HA, or other c/o.  TEST/EVENTS : recent cardioversion for Afib in the last 2 weeks [id number]     1. Left ventricular ejection fraction, by estimation, is 20 to 25%. Left  ventricular ejection fraction by 3D volume is 24 %. The left ventricle has  severely decreased function.   2. Right ventricular systolic function is moderately reduced. The right  ventricular size is normal.   3. Chicken wing appendage 22 mm X 21 mm on 3D MPR. Left atrial size was  moderately dilated. No left atrial/left atrial appendage thrombus was  detected.   4. Right atrial size was mildly dilated.   5. The mitral valve is grossly normal. Mild mitral valve regurgitation.  No evidence of  mitral stenosis.   6. The aortic valve is tricuspid. Aortic valve regurgitation is not  visualized. No aortic stenosis is present.   7. 3D performed of the LAA and demonstrates Chicken wing appendage 22 mm  X 21 mm on 3D MPR.   8. Gatsric contents seen in the transgastric views. Limited additional  imaging performed after this finding.   FINDINGS   Left Ventricle: Left ventricular ejection fraction, by estimation, is 20  to 25%. Left ventricular ejection fraction by 3D volume is 24 %. The left  ventricle has severely decreased function. The left ventricular internal  cavity size was normal in size.   Right Ventricle: The right ventricular size is normal. No increase in  right ventricular wall thickness. Right ventricular systolic function is  moderately reduced.   Left Atrium: Chicken wing appendage 22 mm X 21 mm on 3D MPR. Left atrial  size was moderately dilated. No left atrial/left atrial appendage thrombus  was detected.   Right Atrium: Right atrial size was mildly dilated. Prominent Eustachian  valve.   Pericardium: Trivial pericardial effusion is present. The pericardial  effusion is anterior to the right ventricle.   Mitral Valve: The mitral valve is grossly normal. Mild mitral valve  regurgitation. No evidence of mitral valve stenosis.   Tricuspid Valve: The tricuspid valve is normal in structure. Tricuspid  valve regurgitation is mild . No evidence of tricuspid stenosis.  Aortic Valve: The aortic valve is tricuspid. Aortic valve regurgitation is  not visualized. No aortic stenosis is present. Aortic valve mean gradient  measures 3.0 mmHg. Aortic valve peak gradient measures 5.7 mmHg. Aortic  valve area, by VTI measures 1.09  cm.   Pulmonic Valve: The pulmonic valve was grossly normal. Pulmonic valve  regurgitation is not visualized.   Aorta: The aortic root and ascending aorta are structurally normal, with  no evidence of dilitation.   Pulmonary Artery: The  pulmonary artery is of normal size.   Allergies  Allergen Reactions   Aleve [Naproxen] Other (See Comments)    Unknown reaction    Heparin  Other (See Comments)    Pt reports he's not sure what type of reaction   Shellfish Allergy Itching and Swelling    Immunization History  Administered Date(s) Administered   Janssen (J&J) SARS-COV-2 Vaccination 08/23/2020   PFIZER(Purple Top)SARS-COV-2 Vaccination 11/10/2020   Tdap 06/10/2019    Past Medical History:  Diagnosis Date   Adenomatous colon polyp    tubular   Anemia    Asthma    as a child   CHF (congestive heart failure) (HCC)    Chronic kidney disease    COPD (chronic obstructive pulmonary disease) (HCC)    Diverticulosis    External otitis of right ear 11/10/2018   Gout    Hepatitis C    HLD (hyperlipidemia)    Hypertension    OSA (obstructive sleep apnea) 11/03/2015   Sleep apnea    Strep throat 01/2021   Thyroid  disorder     Tobacco History: Social History   Tobacco Use  Smoking Status Former   Current packs/day: 0.00   Types: Cigarettes   Quit date: 04/02/2011   Years since quitting: 13.1  Smokeless Tobacco Never  Tobacco Comments   Former smoker 04/27/24   Counseling given: Not Answered Tobacco comments: Former smoker 04/27/24   Outpatient Medications Prior to Visit  Medication Sig Dispense Refill   albuterol  (VENTOLIN  HFA) 108 (90 Base) MCG/ACT inhaler Inhale 2 puffs into the lungs every 6 (six) hours as needed for wheezing or shortness of breath. 18 g 2   allopurinol  (ZYLOPRIM ) 100 MG tablet Take 1 tablet (100 mg total) by mouth every Tuesday, Thursday, and Saturday at 6 PM. 15 tablet 0   amiodarone  (PACERONE ) 200 MG tablet Take 1 tablet (200 mg total) by mouth 2 (two) times daily for 30 days, THEN 1 tablet (200 mg total) daily. 60 tablet 2   apixaban  (ELIQUIS ) 5 MG TABS tablet Take 1 tablet (5 mg total) by mouth 2 (two) times daily. 60 tablet 5   atorvastatin  (LIPITOR) 40 MG tablet Take 1 tablet  (40 mg total) by mouth daily. 90 tablet 1   colchicine  0.6 MG tablet TAKE 2 TABLETS BY MOUTH AT THE ONSET OF A GOUT FLARE. MAY REPEAT 1 TABLET IN 1 HOUR IF SYMPTOMS PERSIST 30 tablet 2   guaiFENesin  (MUCINEX ) 600 MG 12 hr tablet Take 2 tablets (1,200 mg total) by mouth 2 (two) times daily. 30 tablet 0   ipratropium-albuterol  (DUONEB) 0.5-2.5 (3) MG/3ML SOLN Inhale 3 mLs into the lungs every 6 (six) hours as needed. 90 mL 3   levothyroxine  (SYNTHROID ) 88 MCG tablet Take 88 mcg by mouth daily.     metoprolol  succinate (TOPROL  XL) 50 MG 24 hr tablet Take 1 tablet (50 mg total) by mouth daily. Take with or immediately following a meal. 30 tablet 11   mometasone -formoterol  (DULERA ) 100-5 MCG/ACT AERO Inhale  2 puffs into the lungs in the morning and at bedtime.     torsemide  (DEMADEX ) 100 MG tablet Take 1 tablet (100 mg total) by mouth daily. 90 tablet 1   triamcinolone  cream (KENALOG ) 0.1 % APPLY  CREAM EXTERNALLY TWICE DAILY 45 g 0   No facility-administered medications prior to visit.     Review of Systems:   Constitutional:   No  weight loss, night sweats,  Fevers, chills, or  lassitude.  Positive for fatigue, daytime sleepiness, snoring.  HEENT:   No headaches,  Difficulty swallowing,  Tooth/dental problems, or  Sore throat,                No sneezing, itching, ear ache, nasal congestion, post nasal drip,   CV:  No chest pain,  Orthopnea, PND, swelling in lower extremities, anasarca, dizziness, syncope.  Positive for palpitations and irregular heart rate  GI  No heartburn, indigestion, abdominal pain, nausea, vomiting, diarrhea, change in bowel habits, loss of appetite, bloody stools.   Resp: No shortness of breath with exertion or at rest.  No excess mucus, no productive cough,  No non-productive cough,  No coughing up of blood.  No change in color of mucus.  No wheezing.  No chest wall deformity  Skin: no rash or lesions.  GU: no dysuria, change in color of urine, no urgency or  frequency.  No flank pain, no hematuria   MS:  No joint pain or swelling.  No decreased range of motion.  No back pain.    Physical Exam  BP 134/86 (BP Location: Right Arm, Patient Position: Sitting, Cuff Size: Normal)   Pulse 91   Temp 97.9 F (36.6 C) (Oral)   Ht 6' 1 (1.854 m)   Wt 176 lb 6.4 oz (80 kg)   SpO2 100%   BMI 23.27 kg/m   GEN: A/Ox3; pleasant , NAD, well nourished    HEENT:  [place]/AT,  EACs-clear, TMs-wnl, NOSE-clear, THROAT-clear, no lesions, no postnasal drip or exudate noted.  Mallampati 3  NECK:  Supple w/ fair ROM; no JVD; normal carotid impulses w/o bruits; no thyromegaly or nodules palpated; no lymphadenopathy.    RESP  Clear  P & A; w/o, wheezes/ rales/ or rhonchi. no accessory muscle use, no dullness to percussion  CARD:  RRR, no m/r/g, no peripheral edema, pulses intact, no cyanosis or clubbing.  GI:   Soft & nt; nml bowel sounds; no organomegaly or masses detected.   Musco: Warm bil, no deformities or joint swelling noted.  Left upper extremity with AV fistula noted  Neuro: alert, no focal deficits noted.    Skin: Warm, no lesions or rashes    Lab Results:  CBC    Component Value Date/Time   WBC 10.1 04/03/2024 0411   RBC 4.11 (L) 04/03/2024 0411   HGB 9.5 (L) 05/04/2024 1036   HGB 9.0 (L) 01/02/2024 1603   HCT 28.0 (L) 05/04/2024 1036   HCT 27.3 (L) 01/02/2024 1603   PLT 223 04/03/2024 0411   PLT 107 (L) 01/02/2024 1603   MCV 77.6 (L) 04/03/2024 0411   MCV 80 01/02/2024 1603   MCH 26.5 04/03/2024 0411   MCHC 34.2 04/03/2024 0411   RDW 18.6 (H) 04/03/2024 0411   RDW 22.6 (H) 01/02/2024 1603   LYMPHSABS 1.5 04/02/2024 1306   LYMPHSABS 1.3 01/02/2024 1603   MONOABS 1.1 (H) 04/02/2024 1306   EOSABS 0.2 04/02/2024 1306   EOSABS 0.3 01/02/2024 1603   BASOSABS 0.1 04/02/2024 1306  BASOSABS 0.1 01/02/2024 1603    BMET    Component Value Date/Time   NA 138 05/04/2024 1036   NA 135 11/08/2020 1654   K 4.9 05/04/2024 1036   CL 102  05/04/2024 1036   CO2 28 04/03/2024 0411   GLUCOSE 86 05/04/2024 1036   BUN 66 (H) 05/04/2024 1036   BUN 42 (H) 11/08/2020 1654   CREATININE 9.60 (H) 05/04/2024 1036   CREATININE 2.78 (H) 12/07/2016 1006   CALCIUM  8.4 (L) 04/03/2024 0411   CALCIUM  6.7 (LL) 10/25/2023 0916   GFRNONAA 8 (L) 04/03/2024 0411   GFRNONAA 25 (L) 12/07/2016 1006   GFRAA 13 (L) 11/08/2020 1654   GFRAA 29 (L) 12/07/2016 1006    BNP    Component Value Date/Time   BNP 1,951.6 (H) 04/02/2024 1306   BNP 44.6 12/07/2016 1006    ProBNP No results found for: PROBNP  Imaging: ECHO TEE Result Date: 05/04/2024    TRANSESOPHOGEAL ECHO REPORT   Patient Name:   Renwick KMARI HALTER Date of Exam: 05/04/2024 Medical Rec #:  [id number]     Height:       73.0 in Accession #:    [id number]    Weight:       171.6 lb Date of Birth:  1961-03-15     BSA:          2.016 m Patient Age:    63 years      BP:           163/92 mmHg Patient Gender: M             HR:           113 bpm. Exam Location:  Inpatient Procedure: 3D Echo, Transesophageal Echo, Cardiac Doppler and Limited Color            Doppler (Both Spectral and Color Flow Doppler were utilized during            procedure). Indications:     Atrial Fibrillation  History:         Patient has prior history of Echocardiogram examinations, most                  recent 04/03/2024. Chronic Kidney Disease and COPD,                  Arrythmias:Atrial Flutter; Risk Factors:Sleep Apnea,                  Dyslipidemia, Hypertension and Former Smoker.  Sonographer:     Koleen Popper RDCS Referring Phys:  [id number] [facility] A SANTO Diagnosing Phys: Stanly SANTO MD PROCEDURE: After discussion of the risks and benefits of a TEE, an informed consent was obtained from the patient. The transesophogeal probe was passed without difficulty through the esophogus of the patient. Imaged were obtained with the patient in a left lateral decubitus position. Sedation performed by different physician. The  patient was monitored while under deep sedation. Anesthestetic sedation was provided intravenously by Anesthesiology: 415mg  of Propofol , 60mg  of Lidocaine . The patient developed no complications during the procedure. A successful direct current cardioversion was performed at 200 joules with 1 attempt.  IMPRESSIONS  1. Left ventricular ejection fraction, by estimation, is 20 to 25%. Left ventricular ejection fraction by 3D volume is 24 %. The left ventricle has severely decreased function.  2. Right ventricular systolic function is moderately reduced. The right ventricular size is normal.  3. Chicken wing appendage 22 mm X 21 mm on 3D  MPR. Left atrial size was moderately dilated. No left atrial/left atrial appendage thrombus was detected.  4. Right atrial size was mildly dilated.  5. The mitral valve is grossly normal. Mild mitral valve regurgitation. No evidence of mitral stenosis.  6. The aortic valve is tricuspid. Aortic valve regurgitation is not visualized. No aortic stenosis is present.  7. 3D performed of the LAA and demonstrates Chicken wing appendage 22 mm X 21 mm on 3D MPR.  8. Gatsric contents seen in the transgastric views. Limited additional imaging performed after this finding. FINDINGS  Left Ventricle: Left ventricular ejection fraction, by estimation, is 20 to 25%. Left ventricular ejection fraction by 3D volume is 24 %. The left ventricle has severely decreased function. The left ventricular internal cavity size was normal in size. Right Ventricle: The right ventricular size is normal. No increase in right ventricular wall thickness. Right ventricular systolic function is moderately reduced. Left Atrium: Chicken wing appendage 22 mm X 21 mm on 3D MPR. Left atrial size was moderately dilated. No left atrial/left atrial appendage thrombus was detected. Right Atrium: Right atrial size was mildly dilated. Prominent Eustachian valve. Pericardium: Trivial pericardial effusion is present. The pericardial  effusion is anterior to the right ventricle. Mitral Valve: The mitral valve is grossly normal. Mild mitral valve regurgitation. No evidence of mitral valve stenosis. Tricuspid Valve: The tricuspid valve is normal in structure. Tricuspid valve regurgitation is mild . No evidence of tricuspid stenosis. Aortic Valve: The aortic valve is tricuspid. Aortic valve regurgitation is not visualized. No aortic stenosis is present. Aortic valve mean gradient measures 3.0 mmHg. Aortic valve peak gradient measures 5.7 mmHg. Aortic valve area, by VTI measures 1.09 cm. Pulmonic Valve: The pulmonic valve was grossly normal. Pulmonic valve regurgitation is not visualized. Aorta: The aortic root and ascending aorta are structurally normal, with no evidence of dilitation. Pulmonary Artery: The pulmonary artery is of normal size. IAS/Shunts: The atrial septum is grossly normal. Additional Comments: 3D was performed not requiring image post processing on an independent workstation and was abnormal. Spectral Doppler performed. LEFT VENTRICLE PLAX 2D LVOT diam:     2.00 cm LV SV:         24 LV SV Index:   12              3D Volume EF LVOT Area:     3.14 cm        LV 3D EF:    Left                                             ventricul                                             ar                                             ejection  fraction                                             by 3D                                             volume is                                             24 %.                                LV 3D EDV:   107.31 ml                                LV 3D ESV:   81.62 ml                                 3D Volume EF:                                3D EF:        24 % AORTIC VALVE AV Area (Vmax):    1.33 cm AV Area (Vmean):   1.39 cm AV Area (VTI):     1.09 cm AV Vmax:           119.00 cm/s AV Vmean:          80.000 cm/s AV VTI:            0.218 m AV Peak Grad:       5.7 mmHg AV Mean Grad:      3.0 mmHg LVOT Vmax:         50.40 cm/s LVOT Vmean:        35.500 cm/s LVOT VTI:          0.076 m LVOT/AV VTI ratio: 0.35  AORTA Ao Asc diam: 3.90 cm  SHUNTS Systemic VTI:  0.08 m Systemic Diam: 2.00 cm Stanly Leavens MD Electronically signed by Stanly Leavens MD Signature Date/Time: 05/04/2024/12:09:39 PM    Final    EP STUDY Result Date: 05/04/2024 See surgical note for result.  MM 3D DIAGNOSTIC MAMMOGRAM BILATERAL BREAST Result Date: 04/29/2024 CLINICAL DATA:  63 year old male presents with palpable thickening in both breasts. EXAM: DIGITAL DIAGNOSTIC BILATERAL MAMMOGRAM WITH TOMOSYNTHESIS AND CAD; ULTRASOUND LEFT BREAST LIMITED; ULTRASOUND RIGHT BREAST LIMITED TECHNIQUE: Bilateral digital diagnostic mammography and breast tomosynthesis was performed. The images were evaluated with computer-aided detection. ; Targeted ultrasound examination of the left breast was performed.; Targeted ultrasound examination of the right breast was performed COMPARISON:  None available. ACR Breast Density Category b: There are scattered areas of fibroglandular density. FINDINGS: Full field and spot compression views of both breast demonstrate bilateral gynecomastia, mild to moderate on the RIGHT and moderate on the LEFT. No suspicious mass, distortion or worrisome calcifications are noted. On physical exam, mild fullness within both  RETROAREOLAR breasts identified without discrete palpable mass. Targeted ultrasound is performed, showing normal fibroglandular tissue within both RETROAREOLAR breasts, LEFT greater than RIGHT. IMPRESSION: 1. Bilateral gynecomastia, moderate on the LEFT and mild to moderate on the RIGHT. 2. No evidence of breast malignancy. RECOMMENDATION: Recommend clinical follow-up as indicated. Any further workup should be based on clinical grounds. I have discussed the findings, causes of gynecomastia and recommendations with the patient. If applicable, a reminder letter  will be sent to the patient regarding the next appointment. BI-RADS CATEGORY  2: Benign. Electronically Signed   By: Reyes Phi M.D.   On: 04/29/2024 09:55   US  LIMITED ULTRASOUND INCLUDING AXILLA RIGHT BREAST Result Date: 04/29/2024 CLINICAL DATA:  63 year old male presents with palpable thickening in both breasts. EXAM: DIGITAL DIAGNOSTIC BILATERAL MAMMOGRAM WITH TOMOSYNTHESIS AND CAD; ULTRASOUND LEFT BREAST LIMITED; ULTRASOUND RIGHT BREAST LIMITED TECHNIQUE: Bilateral digital diagnostic mammography and breast tomosynthesis was performed. The images were evaluated with computer-aided detection. ; Targeted ultrasound examination of the left breast was performed.; Targeted ultrasound examination of the right breast was performed COMPARISON:  None available. ACR Breast Density Category b: There are scattered areas of fibroglandular density. FINDINGS: Full field and spot compression views of both breast demonstrate bilateral gynecomastia, mild to moderate on the RIGHT and moderate on the LEFT. No suspicious mass, distortion or worrisome calcifications are noted. On physical exam, mild fullness within both RETROAREOLAR breasts identified without discrete palpable mass. Targeted ultrasound is performed, showing normal fibroglandular tissue within both RETROAREOLAR breasts, LEFT greater than RIGHT. IMPRESSION: 1. Bilateral gynecomastia, moderate on the LEFT and mild to moderate on the RIGHT. 2. No evidence of breast malignancy. RECOMMENDATION: Recommend clinical follow-up as indicated. Any further workup should be based on clinical grounds. I have discussed the findings, causes of gynecomastia and recommendations with the patient. If applicable, a reminder letter will be sent to the patient regarding the next appointment. BI-RADS CATEGORY  2: Benign. Electronically Signed   By: Reyes Phi M.D.   On: 04/29/2024 09:55   US  LIMITED ULTRASOUND INCLUDING AXILLA LEFT BREAST  Result Date: 04/29/2024 CLINICAL DATA:   63 year old male presents with palpable thickening in both breasts. EXAM: DIGITAL DIAGNOSTIC BILATERAL MAMMOGRAM WITH TOMOSYNTHESIS AND CAD; ULTRASOUND LEFT BREAST LIMITED; ULTRASOUND RIGHT BREAST LIMITED TECHNIQUE: Bilateral digital diagnostic mammography and breast tomosynthesis was performed. The images were evaluated with computer-aided detection. ; Targeted ultrasound examination of the left breast was performed.; Targeted ultrasound examination of the right breast was performed COMPARISON:  None available. ACR Breast Density Category b: There are scattered areas of fibroglandular density. FINDINGS: Full field and spot compression views of both breast demonstrate bilateral gynecomastia, mild to moderate on the RIGHT and moderate on the LEFT. No suspicious mass, distortion or worrisome calcifications are noted. On physical exam, mild fullness within both RETROAREOLAR breasts identified without discrete palpable mass. Targeted ultrasound is performed, showing normal fibroglandular tissue within both RETROAREOLAR breasts, LEFT greater than RIGHT. IMPRESSION: 1. Bilateral gynecomastia, moderate on the LEFT and mild to moderate on the RIGHT. 2. No evidence of breast malignancy. RECOMMENDATION: Recommend clinical follow-up as indicated. Any further workup should be based on clinical grounds. I have discussed the findings, causes of gynecomastia and recommendations with the patient. If applicable, a reminder letter will be sent to the patient regarding the next appointment. BI-RADS CATEGORY  2: Benign. Electronically Signed   By: Reyes Phi M.D.   On: 04/29/2024 09:55    Administration History     None  No data to display          No results found for: NITRICOXIDE   Assessment & Plan:   Assessment & Plan OSA (obstructive sleep apnea) - Plan for home sleep test to reevaluate sleep apnea - Follow-up in clinic for results   Atrial fibrillation: -  Noted -  May improve with  treatment of sleep apnea  Return in about 6 weeks (around 07/01/2024) for sleep study review.  Candis Dandy, PA-C 05/20/2024

## 2024-05-30 ENCOUNTER — Encounter (HOSPITAL_COMMUNITY): Payer: Self-pay

## 2024-06-01 ENCOUNTER — Encounter (HOSPITAL_COMMUNITY): Payer: Self-pay

## 2024-06-04 ENCOUNTER — Ambulatory Visit (HOSPITAL_COMMUNITY): Admitting: Physician Assistant

## 2024-06-05 DIAGNOSIS — N186 End stage renal disease: Secondary | ICD-10-CM | POA: Diagnosis not present

## 2024-06-05 DIAGNOSIS — I129 Hypertensive chronic kidney disease with stage 1 through stage 4 chronic kidney disease, or unspecified chronic kidney disease: Secondary | ICD-10-CM | POA: Diagnosis not present

## 2024-06-05 DIAGNOSIS — Z992 Dependence on renal dialysis: Secondary | ICD-10-CM | POA: Diagnosis not present

## 2024-06-08 ENCOUNTER — Encounter (HOSPITAL_COMMUNITY): Payer: Self-pay

## 2024-06-08 ENCOUNTER — Ambulatory Visit (HOSPITAL_COMMUNITY)
Admission: RE | Admit: 2024-06-08 | Discharge: 2024-06-08 | Disposition: A | Discharge status: Home / Self Care | Source: Ambulatory Visit | Attending: Internal Medicine | Admitting: Internal Medicine

## 2024-06-08 VITALS — BP 116/74 | HR 76 | Ht 73.0 in | Wt 179.0 lb

## 2024-06-08 DIAGNOSIS — I483 Typical atrial flutter: Secondary | ICD-10-CM | POA: Diagnosis not present

## 2024-06-08 DIAGNOSIS — I4891 Unspecified atrial fibrillation: Secondary | ICD-10-CM | POA: Diagnosis not present

## 2024-06-08 DIAGNOSIS — D6869 Other thrombophilia: Secondary | ICD-10-CM | POA: Insufficient documentation

## 2024-06-08 DIAGNOSIS — E039 Hypothyroidism, unspecified: Secondary | ICD-10-CM | POA: Insufficient documentation

## 2024-06-08 DIAGNOSIS — Z7901 Long term (current) use of anticoagulants: Secondary | ICD-10-CM | POA: Insufficient documentation

## 2024-06-08 DIAGNOSIS — I132 Hypertensive heart and chronic kidney disease with heart failure and with stage 5 chronic kidney disease, or end stage renal disease: Secondary | ICD-10-CM | POA: Insufficient documentation

## 2024-06-08 DIAGNOSIS — I5022 Chronic systolic (congestive) heart failure: Secondary | ICD-10-CM | POA: Insufficient documentation

## 2024-06-08 DIAGNOSIS — G4733 Obstructive sleep apnea (adult) (pediatric): Secondary | ICD-10-CM | POA: Insufficient documentation

## 2024-06-08 DIAGNOSIS — N186 End stage renal disease: Secondary | ICD-10-CM | POA: Insufficient documentation

## 2024-06-08 DIAGNOSIS — E785 Hyperlipidemia, unspecified: Secondary | ICD-10-CM | POA: Insufficient documentation

## 2024-06-08 DIAGNOSIS — I484 Atypical atrial flutter: Secondary | ICD-10-CM | POA: Insufficient documentation

## 2024-06-08 DIAGNOSIS — J449 Chronic obstructive pulmonary disease, unspecified: Secondary | ICD-10-CM | POA: Insufficient documentation

## 2024-06-08 NOTE — Progress Notes (Signed)
 Primary Care Physician: Delbert Clam, MD Primary Cardiologist: Lonni Cash, MD Electrophysiologist: None  Referring Physician: Dr Cash [name] [name] is a 63 y.o. male with a history of ESRD, COPD, HTN, CHF, OSA, HLD, hypothyroidism, atrial flutter who presents for follow up in the [facility].  The patient presented to the ED 04/02/24 after developing rapid HR/palpitations while in HD. Per patient, has actually noticed episodes of racing heart for the last year or more. In the ED, patient found to be in atrial flutter with variable ventricular conduction. He was started on diltiazem  and admitted for further management.  Patient was started on Eliquis  for stroke prevention and diltiazem  for rate control.    Patient returns for follow up for atrial fibrillation. He is s/p TEE guided DCCV on 05/04/24. TEE showed severely reduced EF 20-25%. He feels well today and is not aware of his arrhythmia. No bleeding issues on anticoagulation.   On follow up 06/08/24, patient is currently in atrial flutter. He did not begin amiodarone  after last office visit. He did not begin metoprolol  after last office visit. He was discouraged due to reading about side effects. No missed doses of Eliquis .   Today, he  denies symptoms of palpitations, chest pain, shortness of breath, orthopnea, PND, lower extremity edema, dizziness, presyncope, syncope, bleeding, or neurologic sequela. The patient is tolerating medications without difficulties and is otherwise without complaint today.    Atrial Fibrillation Risk Factors:  he does have symptoms or diagnosis of sleep apnea. he does not have a history of rheumatic fever. The patient does not have a history of early familial atrial fibrillation or other arrhythmias.  Atrial Fibrillation Management history:  Previous antiarrhythmic drugs: amiodarone  Previous cardioversions: 05/04/24 Previous ablations: none Anticoagulation  history: Eliquis   ROS- All systems are reviewed and negative except as per the HPI above.  Past Medical History:  Diagnosis Date   Adenomatous colon polyp    tubular   Anemia    Asthma    as a child   CHF (congestive heart failure) (HCC)    Chronic kidney disease    COPD (chronic obstructive pulmonary disease) (HCC)    Diverticulosis    External otitis of right ear 11/10/2018   Gout    Hepatitis C    HLD (hyperlipidemia)    Hypertension    OSA (obstructive sleep apnea) 11/03/2015   Sleep apnea    Strep throat 01/2021   Thyroid  disorder     Current Outpatient Medications  Medication Sig Dispense Refill   albuterol  (VENTOLIN  HFA) 108 (90 Base) MCG/ACT inhaler Inhale 2 puffs into the lungs every 6 (six) hours as needed for wheezing or shortness of breath. 18 g 2   allopurinol  (ZYLOPRIM ) 100 MG tablet Take 1 tablet (100 mg total) by mouth every Tuesday, Thursday, and Saturday at 6 PM. 15 tablet 0   apixaban  (ELIQUIS ) 5 MG TABS tablet Take 1 tablet (5 mg total) by mouth 2 (two) times daily. 60 tablet 5   atorvastatin  (LIPITOR) 40 MG tablet Take 1 tablet (40 mg total) by mouth daily. 90 tablet 1   CARTIA  XT 240 MG 24 hr capsule Take 240 mg by mouth daily.     colchicine  0.6 MG tablet TAKE 2 TABLETS BY MOUTH AT THE ONSET OF A GOUT FLARE. MAY REPEAT 1 TABLET IN 1 HOUR IF SYMPTOMS PERSIST 30 tablet 2   guaiFENesin  (MUCINEX ) 600 MG 12 hr tablet Take 2 tablets (1,200 mg total) by  mouth 2 (two) times daily. 30 tablet 0   ipratropium-albuterol  (DUONEB) 0.5-2.5 (3) MG/3ML SOLN Inhale 3 mLs into the lungs every 6 (six) hours as needed. 90 mL 3   levothyroxine  (SYNTHROID ) 88 MCG tablet Take 88 mcg by mouth daily.     metoprolol  succinate (TOPROL  XL) 50 MG 24 hr tablet Take 1 tablet (50 mg total) by mouth daily. Take with or immediately following a meal. 30 tablet 11   mometasone -formoterol  (DULERA ) 100-5 MCG/ACT AERO Inhale 2 puffs into the lungs in the morning and at bedtime.     torsemide   (DEMADEX ) 100 MG tablet Take 1 tablet (100 mg total) by mouth daily. 90 tablet 1   triamcinolone  cream (KENALOG ) 0.1 % APPLY  CREAM EXTERNALLY TWICE DAILY (Patient taking differently: Apply topically as needed. APPLY CREAM EXTERNALLY TWICE DAILY) 45 g 0   amiodarone  (PACERONE ) 200 MG tablet Take 1 tablet (200 mg total) by mouth 2 (two) times daily for 30 days, THEN 1 tablet (200 mg total) daily. (Patient not taking: No sig reported) 60 tablet 2   No current facility-administered medications for this encounter.    Physical Exam: BP 116/74   Pulse 76   Ht 6' 1 (1.854 m)   Wt 81.2 kg   BMI 23.62 kg/m   GEN- The patient is well appearing, alert and oriented x 3 today.   Neck - no JVD or carotid bruit noted Lungs- Clear to ausculation bilaterally, normal work of breathing Heart- Irregular rate and rhythm, no murmurs, rubs or gallops, PMI not laterally displaced Extremities- no clubbing, cyanosis, or edema Skin - no rash or ecchymosis noted   Wt Readings from Last 3 Encounters:  06/08/24 81.2 kg  05/20/24 80 kg  05/18/24 79.7 kg     EKG today demonstrates  Vent. rate 76 BPM PR interval * ms QRS duration 92 ms QT/QTcB 414/465 ms P-R-T axes * 26 -86 Atrial flutter with variable A-V block Cannot rule out Inferior infarct , age undetermined ST & T wave abnormality, consider lateral ischemia Abnormal ECG When compared with ECG of 18-May-2024 13:47, Minimal criteria for Inferior infarct are now Present ST no longer depressed in Inferior leads  Echo 04/03/24 demonstrated   1. Left ventricular ejection fraction, by estimation, is 60 to 65%. The  left ventricle has normal function. The left ventricle has no regional  wall motion abnormalities. Left ventricular diastolic parameters are  indeterminate.   2. Right ventricular systolic function is normal. The right ventricular  size is mildly enlarged. There is normal pulmonary artery systolic  pressure.   3. Left atrial size was  moderately dilated.   4. Mild RA inversion but otherwise no hemodynamic effects. a small  pericardial effusion is present.   5. The mitral valve is normal in structure. No evidence of mitral valve  regurgitation. No evidence of mitral stenosis.   6. The aortic valve is normal in structure. Aortic valve regurgitation is  not visualized. No aortic stenosis is present.   7. The inferior vena cava is normal in size with greater than 50%  respiratory variability, suggesting right atrial pressure of 3 mmHg.   TEE 05/04/24  1. Left ventricular ejection fraction, by estimation, is 20 to 25%. Left  ventricular ejection fraction by 3D volume is 24 %. The left ventricle has  severely decreased function.   2. Right ventricular systolic function is moderately reduced. The right  ventricular size is normal.   3. Chicken wing appendage 22 mm X 21 mm  on 3D MPR. Left atrial size was  moderately dilated. No left atrial/left atrial appendage thrombus was  detected.   4. Right atrial size was mildly dilated.   5. The mitral valve is grossly normal. Mild mitral valve regurgitation.  No evidence of mitral stenosis.   6. The aortic valve is tricuspid. Aortic valve regurgitation is not  visualized. No aortic stenosis is present.   7. 3D performed of the LAA and demonstrates Chicken wing appendage 22 mm X 21 mm on 3D MPR.   8. Gatsric contents seen in the transgastric views. Limited additional  imaging performed after this finding.     CHA2DS2-VASc Score = 2  The patient's score is based upon: CHF History: 1 HTN History: 1 Diabetes History: 0 Stroke History: 0 Vascular Disease History: 0 Age Score: 0 Gender Score: 0       ASSESSMENT AND PLAN: Typical and atypical atrial flutter The patient's CHA2DS2-VASc score is 2, indicating a 2.2% annual risk of stroke.   S/p DCCV on 05/04/24 with quick return of atrial flutter.  He is currently in atrial flutter. He did not begin amiodarone  or metoprolol   after last office visit. I reiterated to patient the urgent necessity to be proactive in maintaining normal rhythm due to recent dramatic reduction in LVEF. I discussed with patient due to ESRD the only AAD therapy we have available is amiodarone . We discussed ablation as a procedure to consider (would have to take into account of course his ESRD) given his severely decreased LVEF. After discussion, patient seems hesitant to proceed with any intervention. I have scheduled a 1 month f/u for patient to reconsider; he appears very reluctant to proceed with any therapy.   Secondary Hypercoagulable State (ICD10:  D68.69) The patient is at significant risk for stroke/thromboembolism based upon his CHA2DS2-VASc Score of 2.  Continue Apixaban  (Eliquis ).  No missed doses.  Chronic HFrEF EF 20-25% on TEE   OSA  Diagnosed 2017, never started CPAP   ESRD HD on TTS   Follow up 1 month with Daril Kicks, PA-C.    Dorn Heinrich, [facility] [address] [place], [place] [address] [phone]

## 2024-06-09 ENCOUNTER — Encounter (HOSPITAL_COMMUNITY): Payer: Self-pay

## 2024-06-10 ENCOUNTER — Other Ambulatory Visit: Payer: Self-pay

## 2024-06-10 ENCOUNTER — Ambulatory Visit (HOSPITAL_COMMUNITY)
Admission: RE | Admit: 2024-06-10 | Discharge: 2024-06-10 | Disposition: A | Discharge status: Home / Self Care | Attending: Vascular Surgery | Admitting: Vascular Surgery

## 2024-06-10 ENCOUNTER — Encounter (HOSPITAL_COMMUNITY)
Admission: RE | Disposition: A | Discharge status: Home / Self Care | Payer: Self-pay | Source: Home / Self Care | Attending: Vascular Surgery

## 2024-06-10 ENCOUNTER — Encounter (HOSPITAL_COMMUNITY): Payer: Self-pay

## 2024-06-10 DIAGNOSIS — Y832 Surgical operation with anastomosis, bypass or graft as the cause of abnormal reaction of the patient, or of later complication, without mention of misadventure at the time of the procedure: Secondary | ICD-10-CM | POA: Diagnosis not present

## 2024-06-10 DIAGNOSIS — I509 Heart failure, unspecified: Secondary | ICD-10-CM | POA: Diagnosis not present

## 2024-06-10 DIAGNOSIS — Z87891 Personal history of nicotine dependence: Secondary | ICD-10-CM | POA: Insufficient documentation

## 2024-06-10 DIAGNOSIS — Z992 Dependence on renal dialysis: Secondary | ICD-10-CM | POA: Insufficient documentation

## 2024-06-10 DIAGNOSIS — N186 End stage renal disease: Secondary | ICD-10-CM | POA: Insufficient documentation

## 2024-06-10 DIAGNOSIS — I132 Hypertensive heart and chronic kidney disease with heart failure and with stage 5 chronic kidney disease, or end stage renal disease: Secondary | ICD-10-CM | POA: Insufficient documentation

## 2024-06-10 DIAGNOSIS — T82898A Other specified complication of vascular prosthetic devices, implants and grafts, initial encounter: Secondary | ICD-10-CM | POA: Insufficient documentation

## 2024-06-10 HISTORY — PX: A/V FISTULAGRAM: CATH118298

## 2024-06-10 SURGERY — A/V FISTULAGRAM
Anesthesia: LOCAL

## 2024-06-10 MED ORDER — SODIUM CHLORIDE 0.9 % IV SOLN
INTRAVENOUS | Status: AC | PRN
  Administered 2024-06-10: 1000 mL via INTRAVENOUS

## 2024-06-10 MED ORDER — LIDOCAINE HCL (PF) 1 % IJ SOLN
INTRAMUSCULAR | Status: DC | PRN
  Administered 2024-06-10: 5 mL

## 2024-06-10 MED ORDER — IODIXANOL 320 MG/ML IV SOLN
INTRAVENOUS | Status: DC | PRN
  Administered 2024-06-10: 30 mL via INTRAVENOUS

## 2024-06-10 MED ORDER — LIDOCAINE HCL (PF) 1 % IJ SOLN
INTRAMUSCULAR | Status: AC
  Filled 2024-06-10: qty 30

## 2024-06-10 SURGICAL SUPPLY — 4 items
KIT MICROPUNCTURE NIT STIFF (SHEATH) ×1 IMPLANT
SHEATH PROBE COVER 6X72 (BAG) ×1 IMPLANT
TRAY PV CATH (CUSTOM PROCEDURE TRAY) ×3 IMPLANT
TUBING CIL FLEX 10 FLL-RA (TUBING) ×1 IMPLANT

## 2024-06-10 NOTE — Op Note (Signed)
    Patient name: Johnny [name] MRN: [id number] DOB: Apr 01, 1961 Sex: male  06/10/2024 Pre-operative Diagnosis: ESRD on HD Post-operative diagnosis:  Same Surgeon:  Norman GORMAN Serve, MD Procedure Performed:  Ultrasound-guided access of left arm brachiocephalic fistula Fistulogram and central venogram  Indications: [name] is a 63 year old male with ESRD on HD presenting to the HD access center for fistulogram.  He has been having issues with cannulation and infiltration.  Risks and benefits of fistulogram with intervention were reviewed and he elected to proceed.  Findings:  Widely patent central venous system.  The cephalic arch appears to be occluded although the AV fistula is very well collateralized to the deep system and the basilic vein.  The AV fistula itself is dilated and widely patent. Given that the fistula was recently infiltrated and he has an area of induration and hematoma on the lateral aspect I do believe that the issues with cannulation are related to this hematoma which will resolve spontaneously.   Procedure:  The patient was identified in the holding area and taken to the cath lab  The patient was then placed supine on the table and prepped and draped in the usual sterile fashion.  A time out was called.  Ultrasound was used to evaluate the left arm AV access. This was accessed under u/s guidance. An 018 wire was advanced without resistance, a micropuncture sheath was placed and fistulagram obtained which demonstrated the above findings.  The micro sheath was removed and the access was managed with a 4 Monocryl figure-of-eight suture for hemostasis.  Contrast: 25 cc   Norman GORMAN Serve MD [facility] Office: [phone]

## 2024-06-10 NOTE — H&P (Signed)
 HD ACCESS CENTER H&P   Patient ID: Johnny [name], male   DOB: 1961-10-13, 63 y.o.   MRN: [id number]  Subjective:     HPI Johnny [name] is a 63 y.o. male with ESRD presenting to the HD access center for intervention.  Past Medical History:  Diagnosis Date   Adenomatous colon polyp    tubular   Anemia    Asthma    as a child   CHF (congestive heart failure) (HCC)    Chronic kidney disease    COPD (chronic obstructive pulmonary disease) (HCC)    Diverticulosis    External otitis of right ear 11/10/2018   Gout    Hepatitis C    HLD (hyperlipidemia)    Hypertension    OSA (obstructive sleep apnea) 11/03/2015   Sleep apnea    Strep throat 01/2021   Thyroid  disorder    Family History  Problem Relation Age of Onset   Hypertension Mother    Diabetes Mother    Heart disease Mother    Alzheimer's disease Mother    Arthritis Mother    Anemia Father    Kidney disease Brother    Colon cancer Neg Hx    Colon polyps Neg Hx    Esophageal cancer Neg Hx    Rectal cancer Neg Hx    Stomach cancer Neg Hx    Past Surgical History:  Procedure Laterality Date   A/V FISTULAGRAM N/A 11/08/2023   Procedure: A/V Fistulagram;  Surgeon: Magda Debby SAILOR, MD;  Location: [facility] INVASIVE CV LAB;  Service: Vascular;  Laterality: N/A;   AV FISTULA PLACEMENT  09/12/2009   Left arm AVF   CARDIOVERSION N/A 05/04/2024   Procedure: CARDIOVERSION;  Surgeon: Santo Stanly LABOR, MD;  Location: [facility] INVASIVE CV LAB;  Service: Cardiovascular;  Laterality: N/A;   TRANSESOPHAGEAL ECHOCARDIOGRAM (CATH LAB) Left 05/04/2024   Procedure: TRANSESOPHAGEAL ECHOCARDIOGRAM;  Surgeon: Santo Stanly LABOR, MD;  Location: [facility]VASIVE CV [facility];  Service: Cardiovascular;  Laterality: Left;    Short Social History:  Social History   Tobacco Use   Smoking status: Former    Current packs/day: 0.00    Types: Cigarettes    Quit date: 04/02/2011    Years since quitting: 13.2   Smokeless tobacco: Never   Tobacco  comments:    Former smoker 04/27/24  Substance Use Topics   Alcohol use: Never    Alcohol/week: 0.0 standard drinks of alcohol    Allergies  Allergen Reactions   Aleve [Naproxen] Other (See Comments)    Unknown reaction    Heparin  Other (See Comments)    Pt reports he's not sure what type of reaction   Shellfish Allergy Itching and Swelling    No current facility-administered medications for this encounter.    REVIEW OF SYSTEMS All other systems were reviewed and are negative     Objective:   Objective   Vitals:   06/10/24 0922 06/10/24 0935  BP: 132/88 135/78  Pulse: 72 72  Resp: 12 14  Temp: 98.1 F (36.7 C)   TempSrc: Oral   SpO2: 100% 100%   There is no height or weight on file to calculate BMI.  Physical Exam General: no acute distress Cardiac: hemodynamically stable Extremities: Large dilated AVF, particularly in the [facility].  Palpable thrill, areas of hematoma along the lateral aspect.  Data: Fistulogram done in January 2025 was reviewed, no flow-limiting stenosis was seen.     Assessment/Plan:   Johnny [name] is a 63 y.o.  male with ESRD presenting for fistulogram.  Having issues with cannulation issues and infiltration. Reviewed risks and benefits of fistulogram with intervention and patient agreed to proceed.   Norman Serve, MD [facility]

## 2024-06-11 ENCOUNTER — Encounter (HOSPITAL_COMMUNITY): Payer: Self-pay | Admitting: Vascular Surgery

## 2024-06-22 ENCOUNTER — Encounter (HOSPITAL_COMMUNITY): Payer: Self-pay

## 2024-06-30 ENCOUNTER — Ambulatory Visit: Payer: MEDICAID | Admitting: Internal Medicine

## 2024-06-30 ENCOUNTER — Encounter (HOSPITAL_COMMUNITY): Payer: Self-pay

## 2024-07-16 ENCOUNTER — Encounter (HOSPITAL_COMMUNITY): Payer: Self-pay

## 2024-07-24 ENCOUNTER — Telehealth: Payer: Self-pay

## 2024-07-24 NOTE — Telephone Encounter (Signed)
 Copied from CRM [id number]. Topic: Clinical - Prescription Issue >> Jul 24, 2024 10:41 AM Dawna HERO wrote: Reason for CRM: pt called in about medication refill for med that is not on his chart. Pls advise & call back at his cell number .

## 2024-07-27 NOTE — Telephone Encounter (Signed)
 Patient was called and he states that he does not know the name of the medication that he is needing refilled. He states that he informed the person who took the call of the medication but it was not documented in the patient chart. Pt states that he want to file a complaint because he informed the person who took the message of the medication name and it was not documented.

## 2024-08-04 DIAGNOSIS — Z992 Dependence on renal dialysis: Secondary | ICD-10-CM | POA: Diagnosis not present

## 2024-08-04 DIAGNOSIS — N186 End stage renal disease: Secondary | ICD-10-CM | POA: Diagnosis not present

## 2024-08-04 DIAGNOSIS — I129 Hypertensive chronic kidney disease with stage 1 through stage 4 chronic kidney disease, or unspecified chronic kidney disease: Secondary | ICD-10-CM | POA: Diagnosis not present

## 2024-08-12 ENCOUNTER — Encounter (HOSPITAL_COMMUNITY): Payer: Self-pay

## 2024-08-19 ENCOUNTER — Ambulatory Visit: Attending: Cardiovascular Disease | Admitting: Cardiovascular Disease

## 2024-08-19 ENCOUNTER — Encounter: Payer: Self-pay | Admitting: Cardiovascular Disease

## 2024-08-19 ENCOUNTER — Other Ambulatory Visit (HOSPITAL_COMMUNITY): Payer: Self-pay

## 2024-08-19 VITALS — BP 116/74 | HR 71 | Ht 73.0 in | Wt 181.0 lb

## 2024-08-19 DIAGNOSIS — I483 Typical atrial flutter: Secondary | ICD-10-CM | POA: Diagnosis not present

## 2024-08-19 DIAGNOSIS — I428 Other cardiomyopathies: Secondary | ICD-10-CM

## 2024-08-19 NOTE — Patient Instructions (Signed)
 Medication Instructions:  Your physician recommends that you continue on your current medications as directed. Please refer to the Current Medication list given to you today.  *If you need a refill on your cardiac medications before your next appointment, please call your pharmacy*  Lab Work: none If you have labs (blood work) drawn today and your tests are completely normal, you will receive your results only by: MyChart Message (if you have MyChart) OR A paper copy in the mail If you have any lab test that is abnormal or we need to change your treatment, we will call you to review the results.  Testing/Procedures: Your physician has requested that you have an echocardiogram. Echocardiography is a painless test that uses sound waves to create images of your heart. It provides your doctor with information about the size and shape of your heart and how well your heart's chambers and valves are working. This procedure takes approximately one hour. There are no restrictions for this procedure. Please do NOT wear cologne, perfume, aftershave, or lotions (deodorant is allowed). Please arrive 15 minutes prior to your appointment time.  Please note: We ask at that you not bring children with you during ultrasound (echo/ vascular) testing. Due to room size and safety concerns, children are not allowed in the ultrasound rooms during exams. Our front office staff cannot provide observation of children in our lobby area while testing is being conducted. An adult accompanying a patient to their appointment will only be allowed in the ultrasound room at the discretion of the ultrasound technician under special circumstances. We apologize for any inconvenience.   Follow-Up: At [facility], you and your health needs are our priority.  As part of our continuing mission to provide you with exceptional heart care, our providers are all part of one team.  This team includes your primary Cardiologist  (physician) and Advanced Practice Providers or APPs (Physician Assistants and Nurse Practitioners) who all work together to provide you with the care you need, when you need it.  Your next appointment:   6 month(s)  Provider:   One of our Advanced Practice Providers (APPs): Morse Clause, PA-C  Lamarr Satterfield, NP Miriam Shams, NP  Olivia Pavy, PA-C Josefa Beauvais, NP  Leontine Salen, PA-C Orren Fabry, PA-C  Hao Meng, PA-C Ernest Dick, NP  Damien Braver, NP Jon Hails, PA-C  Waddell Donath, PA-C    Dayna Dunn, PA-C  Scott Weaver, PA-C Lum Louis, NP Katlyn West, NP Callie Goodrich, PA-C  Xika Zhao, NP Sheng Haley, PA-C    Kathleen Johnson, PA-C       We recommend signing up for the patient portal called MyChart.  Sign up information is provided on this After Visit Summary.  MyChart is used to connect with patients for Virtual Visits (Telemedicine).  Patients are able to view lab/test results, encounter notes, upcoming appointments, etc.  Non-urgent messages can be sent to your provider as well.   To learn more about what you can do with MyChart, go to [email or web address].   Other Instructions

## 2024-08-19 NOTE — Progress Notes (Signed)
 Chief Complaint  Patient presents with   Follow-up    Atrial flutter   History of Present Illness: 63 yo male with history of ESRD on HD, COPD, HTN, HFpEF, sleep apnea, HLD, hypothyroidism and atrial flutter who is here today for follow up. He was admitted to [facility] in May 2025 with atrial flutter with RVR. Echo 04/03/24 with LVEF=60-65%.  No valve disease. He was rate controlled and discharged home on Cardizem  and Eliquis . He underwent TEE guided cardioversion on 05/04/24. TEE with LVEF=20-25%. He was asked to start metoprolol  and amiodarone  but did not do this due to fear of side effects. He was seen back in the atrial fib clinic on 06/08/24 and was in atrial flutter.   He is here today for follow up. The patient denies any chest pain, dyspnea, palpitations, lower extremity edema, orthopnea, PND, dizziness, near syncope or syncope.   Primary Care Physician: Delbert Clam, MD   Past Medical History:  Diagnosis Date   Adenomatous colon polyp    tubular   Anemia    Asthma    as a child   CHF (congestive heart failure) (HCC)    Chronic kidney disease    COPD (chronic obstructive pulmonary disease) (HCC)    Diverticulosis    External otitis of right ear 11/10/2018   Gout    Hepatitis C    HLD (hyperlipidemia)    Hypertension    OSA (obstructive sleep apnea) 11/03/2015   Sleep apnea    Strep throat 01/2021   Thyroid  disorder     Past Surgical History:  Procedure Laterality Date   A/V FISTULAGRAM N/A 11/08/2023   Procedure: A/V Fistulagram;  Surgeon: Magda Debby SAILOR, MD;  Location: [facility] INVASIVE CV LAB;  Service: Vascular;  Laterality: N/A;   A/V FISTULAGRAM N/A 06/10/2024   Procedure: A/V Fistulagram;  Surgeon: Pearline Norman RAMAN, MD;  Location: [facility] PV LAB;  Service: Cardiovascular;  Laterality: N/A;   AV FISTULA PLACEMENT  09/12/2009   Left arm AVF   CARDIOVERSION N/A 05/04/2024   Procedure: CARDIOVERSION;  Surgeon: Santo Stanly LABOR, MD;  Location: [facility]VAS[facility] CV [facility];   Service: Cardiovascular;  Laterality: N/A;   TRANSESOPHAGEAL ECHOCARDIOGRAM (CATH LAB) Left 05/04/2024   Procedure: TRANSESOPHAGEAL ECHOCARDIOGRAM;  Surgeon: Santo Stanly LABOR, MD;  Location: [facility]VAS[facility] CV [facility];  Service: Cardiovascular;  Laterality: Left;    Current Outpatient Medications  Medication Sig Dispense Refill   albuterol  (VENTOLIN  HFA) 108 (90 Base) MCG/ACT inhaler Inhale 2 puffs into the lungs every 6 (six) hours as needed for wheezing or shortness of breath. 18 g 2   allopurinol  (ZYLOPRIM ) 100 MG tablet Take 1 tablet (100 mg total) by mouth every Tuesday, Thursday, and Saturday at 6 PM. 15 tablet 0   apixaban  (ELIQUIS ) 5 MG TABS tablet Take 1 tablet (5 mg total) by mouth 2 (two) times daily. 60 tablet 5   atorvastatin  (LIPITOR) 40 MG tablet Take 1 tablet (40 mg total) by mouth daily. 90 tablet 1   CARTIA  XT 240 MG 24 hr capsule Take 240 mg by mouth daily.     colchicine  0.6 MG tablet TAKE 2 TABLETS BY MOUTH AT THE ONSET OF A GOUT FLARE. MAY REPEAT 1 TABLET IN 1 HOUR IF SYMPTOMS PERSIST 30 tablet 2   guaiFENesin  (MUCINEX ) 600 MG 12 hr tablet Take 2 tablets (1,200 mg total) by mouth 2 (two) times daily. 30 tablet 0   ipratropium-albuterol  (DUONEB) 0.5-2.5 (3) MG/3ML SOLN Inhale 3 mLs into the lungs every  6 (six) hours as needed. 90 mL 3   levothyroxine  (SYNTHROID ) 88 MCG tablet Take 88 mcg by mouth daily.     metoprolol  succinate (TOPROL  XL) 50 MG 24 hr tablet Take 1 tablet (50 mg total) by mouth daily. Take with or immediately following a meal. 30 tablet 11   mometasone -formoterol  (DULERA ) 100-5 MCG/ACT AERO Inhale 2 puffs into the lungs in the morning and at bedtime.     torsemide  (DEMADEX ) 100 MG tablet Take 1 tablet (100 mg total) by mouth daily. 90 tablet 1   triamcinolone  cream (KENALOG ) 0.1 % APPLY  CREAM EXTERNALLY TWICE DAILY (Patient taking differently: Apply topically as needed. APPLY CREAM EXTERNALLY TWICE DAILY) 45 g 0   amiodarone  (PACERONE ) 200 MG tablet Take 1  tablet (200 mg total) by mouth 2 (two) times daily for 30 days, THEN 1 tablet (200 mg total) daily. (Patient not taking: No sig reported) 60 tablet 2   No current facility-administered medications for this visit.    Allergies  Allergen Reactions   Aleve [Naproxen] Other (See Comments)    Unknown reaction    Heparin  Other (See Comments)    Pt reports he's not sure what type of reaction   Shellfish Allergy Itching and Swelling    Social History   Socioeconomic History   Marital status: Divorced    Spouse name: Not on file   Number of children: 0   Years of education: 14   Highest education level: Not on file  Occupational History   Occupation: Archivist  Tobacco Use   Smoking status: Former    Current packs/day: 0.00    Types: Cigarettes    Quit date: 04/02/2011    Years since quitting: 13.3   Smokeless tobacco: Never   Tobacco comments:    Former smoker 04/27/24  Vaping Use   Vaping status: Never Used  Substance and Sexual Activity   Alcohol use: Never    Alcohol/week: 0.0 standard drinks of alcohol   Drug use: No   Sexual activity: Not Currently  Other Topics Concern   Not on file  Social History Narrative   Lives at home alone.   Right-handed.    Occasional caffeine use.   Social Drivers of Corporate investment banker Strain: Low Risk  (12/23/2023)   Overall Financial Resource Strain (CARDIA)    Difficulty of Paying Living Expenses: Not very hard  Food Insecurity: No Food Insecurity (04/02/2024)   Hunger Vital Sign    Worried About Running Out of Food in the Last Year: Never true    Ran Out of Food in the Last Year: Never true  Transportation Needs: No Transportation Needs (04/03/2024)   PRAPARE - Administrator, Civil Service (Medical): No    Lack of Transportation (Non-Medical): No  Physical Activity: Not on file  Stress: Stress Concern Present (11/20/2023)   [employer] of Occupational Health - Occupational Stress Questionnaire     Feeling of Stress : Rather much  Social Connections: Moderately Integrated (11/20/2023)   Social Connection and Isolation Panel    Frequency of Communication with Friends and Family: More than three times a week    Frequency of Social Gatherings with Friends and Family: More than three times a week    Attends Religious Services: More than 4 times per year    Active Member of [employer] or Organizations: Yes    Attends Engineer, structural: More than 4 times per year    Marital Status: Divorced  Intimate Partner Violence: Not At Risk (04/02/2024)   Humiliation, Afraid, Rape, and Kick questionnaire    Fear of Current or Ex-Partner: No    Emotionally Abused: No    Physically Abused: No    Sexually Abused: No    Family History  Problem Relation Age of Onset   Hypertension Mother    Diabetes Mother    Heart disease Mother    Alzheimer's disease Mother    Arthritis Mother    Anemia Father    Kidney disease Brother    Colon cancer Neg Hx    Colon polyps Neg Hx    Esophageal cancer Neg Hx    Rectal cancer Neg Hx    Stomach cancer Neg Hx     Review of Systems:  As stated in the HPI and otherwise negative.   BP 116/74   Pulse 71   Ht 6' 1 (1.854 m)   Wt 181 lb (82.1 kg)   SpO2 99%   BMI 23.88 kg/m   Physical Examination: General: Well developed, well nourished, NAD  HEENT: OP clear, mucus membranes moist  SKIN: warm, dry. No rashes. Neuro: No focal deficits  Musculoskeletal: Muscle strength 5/5 all ext  Psychiatric: Mood and affect normal  Neck: No JVD, no carotid bruits, no thyromegaly, no lymphadenopathy.  Lungs:Clear bilaterally, no wheezes, rhonci, crackles Cardiovascular: Regular rate and rhythm. No murmurs, gallops or rubs. Abdomen:Soft. Bowel sounds present. Non-tender.  Extremities: No lower extremity edema. Pulses are 2 + in the bilateral DP/PT.  EKG:  EKG is ordered today. The ekg ordered today demonstrates  EKG Interpretation Date/Time:  Wednesday  August 19 2024 16:00:44 EDT Ventricular Rate:  71 PR Interval:  160 QRS Duration:  84 QT Interval:  400 QTC Calculation: 434 R Axis:   36  Text Interpretation: Normal sinus rhythm ST and T wave abn inferior and lateral leads, overall unchanged from prior EKG. Confirmed by Verlin Bruckner [phone]) on 08/19/2024 4:08:15 PM    Recent Labs: 04/02/2024: ALT 16; B Natriuretic Peptide 1,951.6; TSH 1.546 04/03/2024: Magnesium  2.0; Platelets 223 05/04/2024: BUN 66; Creatinine, Ser 9.60; Hemoglobin 9.5; Potassium 4.9; Sodium 138   Lipid Panel    Component Value Date/Time   CHOL 123 05/24/2022 0341   CHOL 214 (H) 11/10/2018 1104   TRIG 44 05/24/2022 0341   HDL 65 05/24/2022 0341   HDL 96 11/10/2018 1104   CHOLHDL 1.9 05/24/2022 0341   VLDL 9 05/24/2022 0341   LDLCALC 49 05/24/2022 0341   LDLCALC 102 (H) 11/10/2018 1104     Wt Readings from Last 3 Encounters:  08/19/24 181 lb (82.1 kg)  06/08/24 179 lb (81.2 kg)  05/20/24 176 lb 6.4 oz (80 kg)    Assessment and Plan:   1. Atrial flutter, persistent: Sinus rhythm today. He does not wish to take amiodarone  or metoprolol . He is taking Cardizem . He is also taking Eliquis  as directed. He feels great. Will follow for now.   2. ESRD on HD (Tues,Th, Sat).   3. Cardiomyopathy: LVEF around 25% prior to cardioversion. Sinus since June 2025. Will repeat echo now to assess LV function. He does not wish to take a beta blocker. He is on HD so will not start an Ace inh or ARB  Labs/ tests ordered today include:   Orders Placed This Encounter  Procedures   EKG 12-Lead   ECHOCARDIOGRAM COMPLETE   Disposition:   F/U with an office APP in 6 months.     Signed, Bruckner Verlin, MD,  [facility] 08/19/2024 4:32 PM    [facility] [address] [place], [place], [place]  [address] Phone: [phone]; Fax: [phone]

## 2024-08-20 ENCOUNTER — Other Ambulatory Visit (HOSPITAL_COMMUNITY): Payer: Self-pay

## 2024-08-20 ENCOUNTER — Encounter (HOSPITAL_COMMUNITY): Payer: Self-pay

## 2024-08-20 ENCOUNTER — Telehealth: Payer: Self-pay | Admitting: Pharmacy Technician

## 2024-08-20 NOTE — Telephone Encounter (Signed)
 Patient Advocate Encounter   The patient was approved for a [employer] grant that will help cover the cost of Eliquis  Total amount awarded, 7500.00.  Effective: 07/21/24 - 07/20/25   APW:[id number] ERW:[id number] Hmnle:[id number] PI:[id number]  [employer] ID: [id number]   Pharmacy provided with approval and processing information. Patient informed via mychart

## 2024-09-02 ENCOUNTER — Other Ambulatory Visit: Payer: Self-pay

## 2024-09-02 DIAGNOSIS — I5032 Chronic diastolic (congestive) heart failure: Secondary | ICD-10-CM

## 2024-09-02 MED ORDER — TORSEMIDE 100 MG PO TABS
100.0000 mg | ORAL_TABLET | Freq: Every day | ORAL | 0 refills | Status: AC
Start: 2024-09-02 — End: 2025-03-01

## 2024-09-24 ENCOUNTER — Telehealth: Payer: Self-pay | Admitting: Cardiovascular Disease

## 2024-09-24 NOTE — Telephone Encounter (Signed)
 Spoke with Dialysis Center. Pt BP currently 146/102, HR 137. Pt asymptomatic. Pt did not take BP medication prior to dialysis, as usual. Told nurse that I would direct this information to the MD and make them aware as well. Dialysis had just begun and unsure of amount of fluid being drawn off, nurse did not have exact number. This nurse felt it was too soon to tell what BP would be at end of treatment. Advised nurse to tell patient to take BP medication when he got home, if BP stable at dc.

## 2024-09-24 NOTE — Telephone Encounter (Signed)
 Pt c/o BP issue: STAT if pt c/o blurred vision, one-sided weakness or slurred speech.  STAT if BP is GREATER than 180/120 TODAY.  STAT if BP is LESS than 90/60 and SYMPTOMATIC TODAY  1. What is your BP concern? Hypertension and hypotension   2. Have you taken any BP medication today?no   3. What are your last 5 BP readings?unknown   4. Are you having any other symptoms (ex. Dizziness, headache, blurred vision, passed out)? No   Pt currently is at the Dialysis center with elevated BP and RN Obas requested Triage

## 2024-10-05 ENCOUNTER — Telehealth: Payer: Self-pay | Admitting: Cardiovascular Disease

## 2024-10-05 ENCOUNTER — Ambulatory Visit (HOSPITAL_COMMUNITY)
Admission: RE | Admit: 2024-10-05 | Discharge: 2024-10-05 | Disposition: A | Source: Ambulatory Visit | Attending: Cardiovascular Disease | Admitting: Cardiovascular Disease

## 2024-10-05 DIAGNOSIS — I428 Other cardiomyopathies: Secondary | ICD-10-CM | POA: Diagnosis not present

## 2024-10-05 LAB — ECHOCARDIOGRAM COMPLETE
Area-P 1/2: 3.6 cm2
S' Lateral: 4.22 cm

## 2024-10-05 NOTE — Telephone Encounter (Signed)
 I spoke with patient.  He reports sometimes his heart is beating fast and he can't take his Cardizem  due to his BP being low. This happens at random times and not always on dialysis days.  Patient is requesting a change in his medication. Asking if he could take one medication for his BP and one for his heart rate.  He did not want to take amiodarone  or metoprolol  in the past.   Appointment made for patient in afib clinic for 12/5 at 10 AM to discuss possible medication changes.

## 2024-10-05 NOTE — Telephone Encounter (Signed)
 Reason for walk-in: Walk-in Reasons: having symptoms (such as chest pain, palpitations, etc.) Patient is in Afib/ Medication Change  If patient is requesting to be seen today, or if patient is having symptoms:  What symptoms are being reported (if any)? Patient in Afib  Route to triage pool and ensure Teams message has been sent to the Triage Walk-In chat.  3.   For medication samples, medication refills, HIM requests, appointment requests, lab-related requests, or form/record drop-off, please route to the appropriate pool. Patient is currently in Afib and would like for someone to call him to discuss his meds.

## 2024-10-06 ENCOUNTER — Ambulatory Visit: Payer: Self-pay | Admitting: Cardiovascular Disease

## 2024-10-09 ENCOUNTER — Ambulatory Visit (HOSPITAL_COMMUNITY): Attending: Physician Assistant | Admitting: Physician Assistant

## 2024-10-09 ENCOUNTER — Encounter (HOSPITAL_COMMUNITY): Payer: Self-pay

## 2024-10-09 NOTE — Progress Notes (Incomplete)
 Primary Care Physician: Delbert Clam, MD Primary Cardiologist: Lonni Cash, MD Electrophysiologist: None  Referring Physician: Dr Johnny [name] is a 63 y.o. male with a history of ESRD, COPD, HTN, CHF, OSA, HLD, hypothyroidism, atrial flutter who presents for follow up in the [facility].  The patient presented to the ED 04/02/24 after developing rapid HR/palpitations while in HD. Per patient, has actually noticed episodes of racing heart for the last year or more. In the ED, patient found to be in atrial flutter with variable ventricular conduction. He was started on diltiazem  and admitted for further management.  Patient was started on Eliquis  for stroke prevention and diltiazem  for rate control.    He is s/p TEE guided DCCV on 05/04/24. TEE showed severely reduced EF 20-25%. Amiodarone  and metoprolol  were prescribed but patient did not start due to concern about side effects.   Patient returns for follow up for atrial fibrillation ***  Today, he  denies symptoms of ***palpitations, chest pain, shortness of breath, orthopnea, PND, lower extremity edema, dizziness, presyncope, syncope, snoring, daytime somnolence, bleeding, or neurologic sequela. The patient is tolerating medications without difficulties and is otherwise without complaint today.    Atrial Fibrillation Risk Factors:  he does have symptoms or diagnosis of sleep apnea. he does not have a history of rheumatic fever. The patient does not have a history of early familial atrial fibrillation or other arrhythmias.  Atrial Fibrillation Management history:  Previous antiarrhythmic drugs: none Previous cardioversions: 05/04/24 Previous ablations: none Anticoagulation history: Eliquis   ROS- All systems are reviewed and negative except as per the HPI above.  Past Medical History:  Diagnosis Date   Adenomatous colon polyp    tubular   Anemia    Asthma    as a child   CHF  (congestive heart failure) (HCC)    Chronic kidney disease    COPD (chronic obstructive pulmonary disease) (HCC)    Diverticulosis    External otitis of right ear 11/10/2018   Gout    Hepatitis C    HLD (hyperlipidemia)    Hypertension    OSA (obstructive sleep apnea) 11/03/2015   Sleep apnea    Strep throat 01/2021   Thyroid  disorder     Current Outpatient Medications  Medication Sig Dispense Refill   albuterol  (VENTOLIN  HFA) 108 (90 Base) MCG/ACT inhaler Inhale 2 puffs into the lungs every 6 (six) hours as needed for wheezing or shortness of breath. 18 g 2   allopurinol  (ZYLOPRIM ) 100 MG tablet Take 1 tablet (100 mg total) by mouth every Tuesday, Thursday, and Saturday at 6 PM. 15 tablet 0   amiodarone  (PACERONE ) 200 MG tablet Take 1 tablet (200 mg total) by mouth 2 (two) times daily for 30 days, THEN 1 tablet (200 mg total) daily. (Patient not taking: No sig reported) 60 tablet 2   apixaban  (ELIQUIS ) 5 MG TABS tablet Take 1 tablet (5 mg total) by mouth 2 (two) times daily. 60 tablet 5   atorvastatin  (LIPITOR) 40 MG tablet Take 1 tablet (40 mg total) by mouth daily. 90 tablet 1   CARTIA  XT 240 MG 24 hr capsule Take 240 mg by mouth daily.     colchicine  0.6 MG tablet TAKE 2 TABLETS BY MOUTH AT THE ONSET OF A GOUT FLARE. MAY REPEAT 1 TABLET IN 1 HOUR IF SYMPTOMS PERSIST 30 tablet 2   guaiFENesin  (MUCINEX ) 600 MG 12 hr tablet Take 2 tablets (1,200 mg total) by mouth  2 (two) times daily. 30 tablet 0   ipratropium-albuterol  (DUONEB) 0.5-2.5 (3) MG/3ML SOLN Inhale 3 mLs into the lungs every 6 (six) hours as needed. 90 mL 3   levothyroxine  (SYNTHROID ) 88 MCG tablet Take 88 mcg by mouth daily.     metoprolol  succinate (TOPROL  XL) 50 MG 24 hr tablet Take 1 tablet (50 mg total) by mouth daily. Take with or immediately following a meal. 30 tablet 11   mometasone -formoterol  (DULERA ) 100-5 MCG/ACT AERO Inhale 2 puffs into the lungs in the morning and at bedtime.     torsemide  (DEMADEX ) 100 MG  tablet Take 1 tablet (100 mg total) by mouth daily. 30 tablet 0   triamcinolone  cream (KENALOG ) 0.1 % APPLY  CREAM EXTERNALLY TWICE DAILY (Patient taking differently: Apply topically as needed. APPLY CREAM EXTERNALLY TWICE DAILY) 45 g 0   No current facility-administered medications for this visit.    Physical Exam: There were no vitals taken for this visit.  GEN: Well nourished, well developed in no acute distress NECK: No JVD; No carotid bruits*** CARDIAC: {EPRHYTHM:28826}, no murmurs, rubs, gallops RESPIRATORY:  Clear to auscultation without rales, wheezing or rhonchi  ABDOMEN: Soft, non-tender, non-distended EXTREMITIES:  No edema; No deformity    Wt Readings from Last 3 Encounters:  08/19/24 82.1 kg  06/08/24 81.2 kg  05/20/24 80 kg       ***   Echo 04/03/24 demonstrated   1. Left ventricular ejection fraction, by estimation, is 60 to 65%. The  left ventricle has normal function. The left ventricle has no regional  wall motion abnormalities. Left ventricular diastolic parameters are  indeterminate.   2. Right ventricular systolic function is normal. The right ventricular  size is mildly enlarged. There is normal pulmonary artery systolic  pressure.   3. Left atrial size was moderately dilated.   4. Mild RA inversion but otherwise no hemodynamic effects. a small  pericardial effusion is present.   5. The mitral valve is normal in structure. No evidence of mitral valve  regurgitation. No evidence of mitral stenosis.   6. The aortic valve is normal in structure. Aortic valve regurgitation is  not visualized. No aortic stenosis is present.   7. The inferior vena cava is normal in size with greater than 50%  respiratory variability, suggesting right atrial pressure of 3 mmHg.   TEE 05/04/24  1. Left ventricular ejection fraction, by estimation, is 20 to 25%. Left  ventricular ejection fraction by 3D volume is 24 %. The left ventricle has  severely decreased function.    2. Right ventricular systolic function is moderately reduced. The right  ventricular size is normal.   3. Chicken wing appendage 22 mm X 21 mm on 3D MPR. Left atrial size was  moderately dilated. No left atrial/left atrial appendage thrombus was  detected.   4. Right atrial size was mildly dilated.   5. The mitral valve is grossly normal. Mild mitral valve regurgitation.  No evidence of mitral stenosis.   6. The aortic valve is tricuspid. Aortic valve regurgitation is not  visualized. No aortic stenosis is present.   7. 3D performed of the LAA and demonstrates Chicken wing appendage 22 mm X 21 mm on 3D MPR.   8. Gatsric contents seen in the transgastric views. Limited additional  imaging performed after this finding.     CHA2DS2-VASc Score = 2  The patient's score is based upon: CHF History: 1 HTN History: 1 Diabetes History: 0 Stroke History: 0 Vascular Disease History:  0 Age Score: 0 Gender Score: 0   {Confirm score is correct.  If not, click here to update score.  REFRESH note.  :1}    ASSESSMENT AND PLAN: Typical and atypical atrial flutter The patient's CHA2DS2-VASc score is 2, indicating a 2.2% annual risk of stroke.  ***  Secondary Hypercoagulable State (ICD10:  D68.69){Click to add to Prob List or Visit Dx  :[id number]} The patient is at significant risk for stroke/thromboembolism based upon his CHA2DS2-VASc Score of 2.  {Anticoag or No Anticoag  :[id number]}         ASSESSMENT AND PLAN: Typical and atypical atrial flutter The patient's CHA2DS2-VASc score is 2, indicating a 2.2% annual risk of stroke.   S/p DCCV on 05/04/24 with quick return of atrial flutter.  He is currently in atrial flutter. He did not begin amiodarone  or metoprolol  after last office visit. I reiterated to patient the urgent necessity to be proactive in maintaining normal rhythm due to recent dramatic reduction in LVEF. I discussed with patient due to ESRD the only AAD therapy we have available is  amiodarone . We discussed ablation as a procedure to consider (would have to take into account of course his ESRD) given his severely decreased LVEF. After discussion, patient seems hesitant to proceed with any intervention. I have scheduled a 1 month f/u for patient to reconsider; he appears very reluctant to proceed with any therapy.     Chronic HFrEF EF *** GDMT per primary cardiology team Fluid status appears stable today ***   OSA  Diagnosed 2017, never been on CPAP. Saw sleep medicine 05/20/24. ***  ESRD HD on TTS ***   Follow up ***    Dorn Heinrich, [facility] [address] [place], [place] [address] [phone]

## 2024-10-14 ENCOUNTER — Encounter: Admitting: Family

## 2024-10-14 ENCOUNTER — Ambulatory Visit: Payer: Self-pay

## 2024-10-14 NOTE — Progress Notes (Signed)
 Erroneous encounter-disregard

## 2024-10-14 NOTE — Telephone Encounter (Signed)
 FYI Only or Action Required?: FYI only for provider: alternate disposition to eye doctor. Pt requesting referral to optometrist.   Patient was last seen in primary care on 01/02/2024 by Danton Jon HERO, PA-C.  Called Nurse Triage reporting Blurred Vision.  Symptoms began several months ago.  Interventions attempted: Other: Recently seen by ophthalmologist.  Symptoms are: gradually worsening.  Triage Disposition: See PCP Within 2 Weeks  Patient/caregiver understands and will follow disposition?: Yes  Reason for Disposition  [1] Blurred vision or visual changes AND [2] gradual onset (e.g., weeks, months)  Answer Assessment - Initial Assessment Questions Patient states that he has been having vision changes for about 1 year now and recently went to the ophthalmologist who recommended he go see an optometrist urgently. Patient requesting referral to optometrist.    1. DESCRIPTION: How has your vision changed? (e.g., complete vision loss, blurred vision, double vision, floaters, etc.)     Blurred vision and Vision loss  2. LOCATION: One or both eyes? If one, ask: Which eye?     Both  3. SEVERITY: Can you see anything? If Yes, ask: What can you see? (e.g., fine print)     Yes  4. ONSET: When did this begin? Did it start suddenly or has this been gradual?     About a year ago  5. PATTERN: Does this come and go, or has it been constant since it started?     Constant  6. PAIN: Is there any pain in your eye(s)?  (Scale 1-10; or mild, moderate, severe)     No  7. CONTACTS-GLASSES: Do you wear contacts or glasses?     No  8. CAUSE: What do you think is causing this visual problem?     It might be glaucoma, it might be cataracts.  9. OTHER SYMPTOMS: Do you have any other symptoms? (e.g., confusion, headache, arm or leg weakness, speech problems)     Denies any other symptoms  10. PREGNANCY: Is there any chance you are pregnant? When was your last  menstrual period?       NA  Protocols used: Vision Loss or Change-A-AH  Copied from CRM #[id number]. Topic: Clinical - Red Word Triage >> Oct 14, 2024 11:13 AM Shanda MATSU wrote: Red Word that prompted transfer to Nurse Triage: Patient is reporting blurry vision for about a year now but is getting worse, patient stated that he went to see an eye doctor and was adv that he needs to see an ophthalmologist because he could go blind at any moment. Patient initially called in req a referral to see an ophthalmologist.

## 2024-10-14 NOTE — Telephone Encounter (Signed)
 Patient was called and informed to reach out to the eye doctor to see if they will place the referral since they are wanting him to see another specialist.  Patient states that he will reach out to the eye doctor regarding the referral.

## 2024-10-16 ENCOUNTER — Other Ambulatory Visit: Payer: Self-pay | Admitting: Family Medicine

## 2024-10-16 DIAGNOSIS — I5032 Chronic diastolic (congestive) heart failure: Secondary | ICD-10-CM

## 2024-10-26 ENCOUNTER — Encounter (HOSPITAL_COMMUNITY): Payer: Self-pay

## 2024-10-27 ENCOUNTER — Encounter (HOSPITAL_COMMUNITY): Payer: Self-pay

## 2024-10-27 ENCOUNTER — Telehealth: Payer: Self-pay | Admitting: Family Medicine

## 2024-10-27 NOTE — Telephone Encounter (Signed)
 Copied from CRM [id number]. Topic: Referral - Request for Referral >> Oct 27, 2024  9:59 AM Delon DASEN wrote:  Did the patient discuss referral with their provider in the last year? No (If No - schedule appointment) (If Yes - send message)  Appointment offered? Yes  Type of order/referral and detailed reason for visit: optometrist  Preference of office, provider, location: office that is local  If referral order, have you been seen by this specialty before? No (If Yes, this issue or another issue? When? Where?  Can we respond through MyChart? No

## 2024-10-27 NOTE — Telephone Encounter (Signed)
 Copied from CRM [id number]. Topic: Referral - Request for Referral >> Oct 27, 2024 10:03 AM Delon DASEN wrote:  Did the patient discuss referral with their provider in the last year? No (If No - schedule appointment) (If Yes - send message)  Appointment offered? Yes  Type of order/referral and detailed reason for visit: foot doctor  Preference of office, provider, location: [facility]  If referral order, have you been seen by this specialty before? Yes (If Yes, this issue or another issue? When? Where? [facility] a year ago  Can we respond through MyChart? No

## 2024-10-27 NOTE — Telephone Encounter (Signed)
 Contacted patient, Appointment scheduled.

## 2024-10-27 NOTE — Telephone Encounter (Signed)
 Please reach out to the patient and schedule him an appointment he has not been seen since early this year.

## 2024-10-27 NOTE — Telephone Encounter (Signed)
 See previous encounter

## 2024-11-03 ENCOUNTER — Other Ambulatory Visit: Payer: Self-pay | Admitting: Family Medicine

## 2024-11-03 DIAGNOSIS — L2089 Other atopic dermatitis: Secondary | ICD-10-CM

## 2024-11-13 ENCOUNTER — Telehealth: Payer: Self-pay | Admitting: Family Medicine

## 2024-11-13 NOTE — Telephone Encounter (Signed)
Awaiting on the fax

## 2024-11-13 NOTE — Telephone Encounter (Signed)
 Copied from CRM [id number]. Topic: General - Other >> Nov 13, 2024  9:16 AM   Lonell PEDLAR wrote:  Reason for CRM: Patient called, states that isurance company is requesting verification of patient diagnoses.  They are needing this to ensure that patient's benefits remain the same. Stated they will fax form to office. number for insurance: [phone] or  [phone] Patient is requesting a call back with an update when able. [phone]

## 2024-11-13 NOTE — Telephone Encounter (Signed)
 Noted

## 2024-11-16 NOTE — Telephone Encounter (Signed)
 Copied from CRM [id number]. Topic: Clinical - Medical Advice >> Nov 16, 2024 10:57 AM   Treva T wrote:  Reason for CRM: Pt calling to check status of previous request of submitting medical information and medical diagnosis for insurance purposes so that he can keep insurance coverage.  Pt is calling to check status.  Number for insurance: [phone], or [phone].  Pt is requesting a return follow up call with status update.  Pt is aware of same day follow up call.  Pt ph. [phone].

## 2024-11-16 NOTE — Telephone Encounter (Signed)
 Attempted to contact patient to inform him that no paperwork has been received as of today. VM is currently full.

## 2024-11-18 NOTE — Telephone Encounter (Signed)
 Copied from CRM [id number]. Topic: Clinical - Medical Advice >> Nov 16, 2024 10:57 AM   Treva T wrote:  Reason for CRM: Pt calling to check status of previous request of submitting medical information and medical diagnosis for insurance purposes so that he can keep insurance coverage.  Pt is calling to check status.  Number for insurance: [phone], or [phone].  Pt is requesting a return follow up call with status update.  Pt is aware of same day follow up call.  Pt ph. [phone].  >> Nov 18, 2024 11:11 AM Larissa RAMAN wrote:  Patient requesting a callback from social worker/ case worker.

## 2024-11-18 NOTE — Telephone Encounter (Signed)
 Patient has been informed that no paperwork has been faxed.

## 2024-11-24 ENCOUNTER — Telehealth: Payer: Self-pay | Admitting: Family Medicine

## 2024-11-24 NOTE — Telephone Encounter (Signed)
 Contacted pt confirmed appt

## 2024-11-25 ENCOUNTER — Ambulatory Visit: Payer: Self-pay | Admitting: Family Medicine

## 2024-11-25 ENCOUNTER — Encounter: Payer: Self-pay | Admitting: Family Medicine

## 2024-11-25 VITALS — BP 112/72 | HR 70 | Temp 98.1°F | Ht 73.0 in | Wt 180.4 lb

## 2024-11-25 DIAGNOSIS — K621 Rectal polyp: Secondary | ICD-10-CM

## 2024-11-25 DIAGNOSIS — I483 Typical atrial flutter: Secondary | ICD-10-CM | POA: Diagnosis not present

## 2024-11-25 DIAGNOSIS — I1311 Hypertensive heart and chronic kidney disease without heart failure, with stage 5 chronic kidney disease, or end stage renal disease: Secondary | ICD-10-CM

## 2024-11-25 DIAGNOSIS — N186 End stage renal disease: Secondary | ICD-10-CM

## 2024-11-25 DIAGNOSIS — L603 Nail dystrophy: Secondary | ICD-10-CM

## 2024-11-25 DIAGNOSIS — L2089 Other atopic dermatitis: Secondary | ICD-10-CM

## 2024-11-25 DIAGNOSIS — E039 Hypothyroidism, unspecified: Secondary | ICD-10-CM | POA: Diagnosis not present

## 2024-11-25 DIAGNOSIS — J432 Centrilobular emphysema: Secondary | ICD-10-CM

## 2024-11-25 DIAGNOSIS — H539 Unspecified visual disturbance: Secondary | ICD-10-CM | POA: Diagnosis not present

## 2024-11-25 MED ORDER — TORSEMIDE 100 MG PO TABS: 100.0000 mg | ORAL_TABLET | Freq: Every day | ORAL | 3 refills | Status: AC

## 2024-11-25 NOTE — Patient Instructions (Signed)
 Rash, Adult A rash is a breakout of spots or blotches on the skin. It can affect the way the skin looks and feels. Many things can cause a rash. Common causes include: Viral infections. These include colds, measles, and hand, foot, and mouth disease. Bacterial infections. These include scarlet fever and impetigo. Fungal infections. These include athlete's foot, ringworm, and yeast rashes. Skin irritation. This may be from heat rash, exposure to moisture or friction for a long time (intertrigo), or exposure to soap or skin care products (eczema). Allergic reactions. These may be caused by foods, medicines, or things like poison ivy. Some rashes may go away after a few days. Others may last for a few weeks. The goal of treatment is to stop the itching and keep the rash from spreading. Follow these instructions at home: Medicine Take or apply over-the-counter and prescription medicines only as told by your health care provider. These may include: Corticosteroids. These can help treat red or swollen skin. They may be given as creams or as medicines to take by mouth (orally). Anti-itch lotions. Allergy medicines. Pain medicine. Antifungal medicine if the rash is from a fungal infection. Antibiotics if you have an infection.  Skin care Apply cool, wet cloths (compresses) to the affected areas. Do not scratch or rub your skin. Avoid covering the rash. Keep it exposed to air as often as you can. Managing itching and discomfort Avoid hot showers and baths. These can make itching worse. A cold shower may help. Try taking a bath with: Epsom salts. You can get these at your local pharmacy or grocery store. Follow the instructions on the package. Baking soda. Pour a small amount into the bath as told by your provider. Colloidal oatmeal. You can get this at your local pharmacy or grocery store. Follow the instructions on the package. Try putting baking soda paste on your skin. Stir water into baking  soda until it becomes like a paste. Try using calamine lotion or cortisone cream to help with itchiness. Keep cool. Stay out of the sun. Sweating and being hot can make itching worse. General instructions  Rest as needed. Drink enough fluid to keep your pee (urine) pale yellow. Wear loose-fitting clothes. Avoid scented soaps, detergents, and perfumes. Use gentle soaps, detergents, perfumes, and cosmetics. Avoid the things that cause your rash (triggers). Keep a journal to help keep track of your triggers. Write down: What you eat. What cosmetics you use. What you drink. What you wear. This includes jewelry. Contact a health care provider if: You sweat at night more than normal. You pee (urinate) more or less than normal, or your pee is a darker color than normal. Your eyes become sensitive to light. Your skin or the white parts of your eyes turn yellow (jaundice). Your skin tingles or is numb. You get painful blisters in your nose or mouth. Your rash does not go away after a few days, or it gets worse. You are more tired or thirsty than normal. You have new or worse symptoms. These may include: Pain in your abdomen. Fever. Diarrhea or vomiting. Weakness or weight loss. Get help right away if: You get confused. You have a severe headache, a stiff neck, or severe joint pain or stiffness. You become very sleepy or not responsive. You have a seizure. This information is not intended to replace advice given to you by your health care provider. Make sure you discuss any questions you have with your health care provider. Document Revised: 08/10/2022 Document Reviewed:  08/10/2022 Elsevier Patient Education  2024 [employer].

## 2024-11-25 NOTE — Progress Notes (Addendum)
 "  Subjective:  Patient ID: Johnny [name], male    DOB: 04/14/1961  Age: 64 y.o. MRN: [id number]  CC: Referral (Needing referral for podiatry,dermatology and opthalmology )     Discussed the use of AI scribe software for clinical note transcription with the patient, who gave verbal consent to proceed.  History of Present Illness Johnny [name] is a 64 y.o. year old male  with a history of  COPD, Gout, ESRD on HD, hypertension, hypothyroidism sleep apnea, HFrEF (EF 30-35% from echo 10/2024), atrial flutter (status post TEE guided cardioversion in 03/2024).   He keeps Cardizem  'on hand for blood pressure or heart rate spikes' but has not needed it recently.  He states he does not take it regularly because his blood pressure is normal.  He continues to take metoprolol  and torsemide . For his atrial flutter he endorses adherence with his Eliquis .  His last cardiology visit was in 08/2024.  He smokes about one pack every three days and is using nicotine  patches but is not yet ready to fully quit.  He has eczema on his hands and scalp and wants a referral to dermatology.  He has foot pain and wants referral to podiatry for nail clipping and foot care. He has worsening vision and was advised by an optometrist to see an ophthalmologist for a possible blockage affecting his vision.  He had colon polyps on colonoscopy in 2015 and finds the bowel prep difficult because of his fluid intake restrictions.  Past Medical History:  Diagnosis Date   Adenomatous colon polyp    tubular   Anemia    Asthma    as a child   CHF (congestive heart failure) (HCC)    Chronic kidney disease    COPD (chronic obstructive pulmonary disease) (HCC)    Diverticulosis    External otitis of right ear 11/10/2018   Gout    Hepatitis C    HLD (hyperlipidemia)    Hypertension    OSA (obstructive sleep apnea) 11/03/2015   Sleep apnea    Strep throat 01/2021   Thyroid  disorder     Past Surgical History:  Procedure  Laterality Date   A/V FISTULAGRAM N/A 11/08/2023   Procedure: A/V Fistulagram;  Surgeon: Magda Debby SAILOR, MD;  Location: [facility];  Service: Vascular;  Laterality: N/A;   A/V FISTULAGRAM N/A 06/10/2024   Procedure: A/V Fistulagram;  Surgeon: Pearline Norman RAMAN, MD;  Location: [facility];  Service: Cardiovascular;  Laterality: N/A;   AV FISTULA PLACEMENT  09/12/2009   Left arm AVF   CARDIOVERSION N/A 05/04/2024   Procedure: CARDIOVERSION;  Surgeon: Santo Stanly LABOR, MD;  Location: [facility];  Service: Cardiovascular;  Laterality: N/A;   TRANSESOPHAGEAL ECHOCARDIOGRAM (CATH LAB) Left 05/04/2024   Procedure: TRANSESOPHAGEAL ECHOCARDIOGRAM;  Surgeon: Santo Stanly LABOR, MD;  Location: [facility];  Service: Cardiovascular;  Laterality: Left;    Family History  Problem Relation Age of Onset   Hypertension Mother    Diabetes Mother    Heart disease Mother    Alzheimer's disease Mother    Arthritis Mother    Anemia Father    Kidney disease Brother    Colon cancer Neg Hx    Colon polyps Neg Hx    Esophageal cancer Neg Hx    Rectal cancer Neg Hx    Stomach cancer Neg Hx     Social History   Socioeconomic History   Marital status: Divorced    Spouse  name: Not on file   Number of children: 0   Years of education: 14   Highest education level: Not on file  Occupational History   Occupation: Archivist  Tobacco Use   Smoking status: Former    Current packs/day: 0.00    Average packs/day: 0.1 packs/day    Types: Cigarettes    Quit date: 04/02/2011    Years since quitting: 13.6   Smokeless tobacco: Never   Tobacco comments:    Former smoker 04/27/24  Vaping Use   Vaping status: Never Used  Substance and Sexual Activity   Alcohol use: Never    Alcohol/week: 0.0 standard drinks of alcohol   Drug use: No   Sexual activity: Not Currently  Other Topics Concern   Not on file  Social History Narrative   Lives at home alone.   Right-handed.     Occasional caffeine use.   Social Drivers of Health   Tobacco Use: Medium Risk (11/25/2024)   Patient History    Smoking Tobacco Use: Former    Smokeless Tobacco Use: Never    Passive Exposure: Not on file  Financial Resource Strain: Low Risk (12/23/2023)   Overall Financial Resource Strain (CARDIA)    Difficulty of Paying Living Expenses: Not very hard  Food Insecurity: No Food Insecurity (04/02/2024)   Hunger Vital Sign    Worried About Running Out of Food in the Last Year: Never true    Ran Out of Food in the Last Year: Never true  Transportation Needs: No Transportation Needs (04/03/2024)   PRAPARE - Administrator, Civil Service (Medical): No    Lack of Transportation (Non-Medical): No  Physical Activity: Not on file  Stress: Stress Concern Present (11/20/2023)   [employer] of Occupational Health - Occupational Stress Questionnaire    Feeling of Stress : Rather much  Social Connections: Moderately Integrated (11/20/2023)   Social Connection and Isolation Panel    Frequency of Communication with Friends and Family: More than three times a week    Frequency of Social Gatherings with Friends and Family: More than three times a week    Attends Religious Services: More than 4 times per year    Active Member of Clubs or Organizations: Yes    Attends Banker Meetings: More than 4 times per year    Marital Status: Divorced  Depression (PHQ2-9): Low Risk (11/25/2024)   Depression (PHQ2-9)    PHQ-2 Score: 0  Alcohol Screen: Low Risk (11/20/2023)   Alcohol Screen    Last Alcohol Screening Score (AUDIT): 0  Housing: Low Risk (04/02/2024)   Housing Stability Vital Sign    Unable to Pay for Housing in the Last Year: No    Number of Times Moved in the Last Year: 0    Homeless in the Last Year: No  Utilities: Not At Risk (04/02/2024)   AHC Utilities    Threatened with loss of utilities: No  Health Literacy: Adequate Health Literacy (11/20/2023)   B1300 Health  Literacy    Frequency of need for help with medical instructions: Never    Allergies[1]  Outpatient Medications Prior to Visit  Medication Sig Dispense Refill   albuterol  (VENTOLIN  HFA) 108 (90 Base) MCG/ACT inhaler Inhale 2 puffs into the lungs every 6 (six) hours as needed for wheezing or shortness of breath. 18 g 2   allopurinol  (ZYLOPRIM ) 100 MG tablet Take 1 tablet (100 mg total) by mouth every Tuesday, Thursday, and Saturday at 6 PM. 15  tablet 0   amiodarone  (PACERONE ) 200 MG tablet Take 1 tablet (200 mg total) by mouth 2 (two) times daily for 30 days, THEN 1 tablet (200 mg total) daily. 60 tablet 2   apixaban  (ELIQUIS ) 5 MG TABS tablet Take 1 tablet (5 mg total) by mouth 2 (two) times daily. 60 tablet 5   atorvastatin  (LIPITOR) 40 MG tablet Take 1 tablet (40 mg total) by mouth daily. 90 tablet 1   CARTIA  XT 240 MG 24 hr capsule Take 240 mg by mouth daily.     colchicine  0.6 MG tablet TAKE 2 TABLETS BY MOUTH AT THE ONSET OF A GOUT FLARE. MAY REPEAT 1 TABLET IN 1 HOUR IF SYMPTOMS PERSIST 30 tablet 0   ipratropium-albuterol  (DUONEB) 0.5-2.5 (3) MG/3ML SOLN Inhale 3 mLs into the lungs every 6 (six) hours as needed. 90 mL 3   levothyroxine  (SYNTHROID ) 88 MCG tablet Take 88 mcg by mouth daily.     metoprolol  succinate (TOPROL  XL) 50 MG 24 hr tablet Take 1 tablet (50 mg total) by mouth daily. Take with or immediately following a meal. 30 tablet 11   mometasone -formoterol  (DULERA ) 100-5 MCG/ACT AERO Inhale 2 puffs into the lungs in the morning and at bedtime.     triamcinolone  cream (KENALOG ) 0.1 % APPLY  CREAM EXTERNALLY TWICE DAILY 45 g 0   torsemide  (DEMADEX ) 100 MG tablet Take 1 tablet by mouth once daily 30 tablet 3   guaiFENesin  (MUCINEX ) 600 MG 12 hr tablet Take 2 tablets (1,200 mg total) by mouth 2 (two) times daily. (Patient not taking: Reported on 11/25/2024) 30 tablet 0   No facility-administered medications prior to visit.     ROS Review of Systems  Constitutional:  Negative  for activity change and appetite change.  HENT:  Negative for sinus pressure and sore throat.   Eyes:  Positive for visual disturbance.  Respiratory:  Negative for chest tightness, shortness of breath and wheezing.   Cardiovascular:  Negative for chest pain and palpitations.  Gastrointestinal:  Negative for abdominal distention, abdominal pain and constipation.  Genitourinary: Negative.   Musculoskeletal: Negative.   Skin:  Positive for rash.  Psychiatric/Behavioral:  Negative for behavioral problems and dysphoric mood.     Objective:  BP 112/72   Pulse 70   Temp 98.1 F (36.7 C) (Oral)   Ht 6' 1 (1.854 m)   Wt 180 lb 6.4 oz (81.8 kg)   SpO2 99%   BMI 23.80 kg/m      11/25/2024    2:09 PM 08/19/2024    3:57 PM 06/10/2024   10:46 AM  BP/Weight  Systolic BP 112 116 150  Diastolic BP 72 74 89  Wt. (Lbs) 180.4 181   BMI 23.8 kg/m2 23.88 kg/m2       Physical Exam Constitutional:      Appearance: He is well-developed.  Cardiovascular:     Rate and Rhythm: Normal rate.     Heart sounds: Normal heart sounds. No murmur heard. Pulmonary:     Effort: Pulmonary effort is normal.     Breath sounds: Normal breath sounds. No wheezing or rales.  Chest:     Chest wall: No tenderness.  Abdominal:     General: Bowel sounds are normal. There is no distension.     Palpations: Abdomen is soft. There is no mass.     Tenderness: There is no abdominal tenderness.  Musculoskeletal:        General: Normal range of motion.  Right lower leg: No edema.     Left lower leg: No edema.     Comments: Left forearm AV fistula with palpable thrill  Skin:    Comments: Hyperpigmented rough rash in palm of left hand  Neurological:     Mental Status: He is alert and oriented to person, place, and time.  Psychiatric:        Mood and Affect: Mood normal.        Latest Ref Rng & Units 05/04/2024   10:36 AM 04/03/2024    4:11 AM 04/02/2024    1:06 PM  CMP  Glucose 70 - 99 mg/dL 86  894  891    BUN 8 - 23 mg/dL 66  39  19   Creatinine 0.61 - 1.24 mg/dL 0.39  3.13  5.31   Sodium 135 - 145 mmol/L 138  136  141   Potassium 3.5 - 5.1 mmol/L 4.9  4.5  3.5   Chloride 98 - 111 mmol/L 102  97  97   CO2 22 - 32 mmol/L  28  32   Calcium  8.9 - 10.3 mg/dL  8.4  8.3   Total Protein 6.5 - 8.1 g/dL   8.4   Total Bilirubin 0.0 - 1.2 mg/dL   0.7   Alkaline Phos 38 - 126 U/L   61   AST 15 - 41 U/L   20   ALT 0 - 44 U/L   16     Lipid Panel     Component Value Date/Time   CHOL 123 05/24/2022 0341   CHOL 214 (H) 11/10/2018 1104   TRIG 44 05/24/2022 0341   HDL 65 05/24/2022 0341   HDL 96 11/10/2018 1104   CHOLHDL 1.9 05/24/2022 0341   VLDL 9 05/24/2022 0341   LDLCALC 49 05/24/2022 0341   LDLCALC 102 (H) 11/10/2018 1104    CBC    Component Value Date/Time   WBC 10.1 04/03/2024 0411   RBC 4.11 (L) 04/03/2024 0411   HGB 9.5 (L) 05/04/2024 1036   HGB 9.0 (L) 01/02/2024 1603   HCT 28.0 (L) 05/04/2024 1036   HCT 27.3 (L) 01/02/2024 1603   PLT 223 04/03/2024 0411   PLT 107 (L) 01/02/2024 1603   MCV 77.6 (L) 04/03/2024 0411   MCV 80 01/02/2024 1603   MCH 26.5 04/03/2024 0411   MCHC 34.2 04/03/2024 0411   RDW 18.6 (H) 04/03/2024 0411   RDW 22.6 (H) 01/02/2024 1603   LYMPHSABS 1.5 04/02/2024 1306   LYMPHSABS 1.3 01/02/2024 1603   MONOABS 1.1 (H) 04/02/2024 1306   EOSABS 0.2 04/02/2024 1306   EOSABS 0.3 01/02/2024 1603   BASOSABS 0.1 04/02/2024 1306   BASOSABS 0.1 01/02/2024 1603    Lab Results  Component Value Date   HGBA1C 5.3 02/06/2023       Assessment and plan:  Typical atrial flutter Managed with amiodarone  and metoprolol . Blood pressure well-controlled. -Per patient he has not been needing to take his Cardizem  only when he feels his heart rate is elevated. 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 Dulera  used as needed. - Advised daily use of Dulera  for prevention of exacerbations.  Sessile rectal polyp Due for colonoscopy due to history of  polyps. Previous colonoscopy in 2015 showed polyps including sessile rectal polyp - Referred to GI for colonoscopy.  Hypothyroidism Controlled - Remains on levothyroxine   General health maintenance Discussed importance of regular screenings and specialist follow-ups. Atopic dermatitis- Referred to dermatologist for eczema management. Dystrophic nail- Referred to podiatrist for foot care and nail clipping. Abnormal vision- Referred to ophthalmologist for vision evaluation.     Meds ordered this encounter  Medications   torsemide  (DEMADEX ) 100 MG tablet    Sig: Take 1 tablet (100 mg total) by mouth daily.    Dispense:  30 tablet    Refill:  3    Follow-up: Return in about 6 months (around 05/25/2025) for Chronic medical conditions.       Corrina Sabin, MD, FAAFP. [facility] [place], [place] [phone]   11/25/2024, 5:46 PM     [1]  Allergies Allergen Reactions   Aleve [Naproxen] Other (See Comments)    Unknown reaction    Heparin  Other (See Comments)    Pt reports he's not sure what type of reaction   Shellfish Allergy Itching and Swelling   "

## 2024-11-27 ENCOUNTER — Encounter (HOSPITAL_COMMUNITY): Payer: Self-pay

## 2024-12-01 ENCOUNTER — Telehealth: Payer: Self-pay | Admitting: Family Medicine

## 2024-12-01 NOTE — Telephone Encounter (Signed)
Noted. Will keep a look out for paperwork.

## 2024-12-01 NOTE — Telephone Encounter (Signed)
 Copied from CRM #[id number]. Topic: General - Other >> Nov 30, 2024  4:49 PM Hadassah PARAS wrote: Reason for CRM:  Pt is req a doctors note where it states pt has chronic conditions to qualify for specific plan. [employer] will be needing these documents. Insurance will go in effect next month and pt is being proactive. Please advise on #[id number]

## 2024-12-11 ENCOUNTER — Encounter (HOSPITAL_COMMUNITY): Payer: Self-pay

## 2024-12-11 ENCOUNTER — Ambulatory Visit: Admitting: Podiatry
